# Patient Record
Sex: Male | Born: 1940 | Race: White | Hispanic: No | Marital: Married | Smoking: Never smoker
Health system: Southern US, Community
[De-identification: ages and names within clinical notes are randomized; demographics above are authoritative.]

## PROBLEM LIST (undated history)

## (undated) DIAGNOSIS — I4891 Unspecified atrial fibrillation: Secondary | ICD-10-CM

## (undated) DIAGNOSIS — N2889 Other specified disorders of kidney and ureter: Secondary | ICD-10-CM

## (undated) DIAGNOSIS — G40909 Epilepsy, unspecified, not intractable, without status epilepticus: Secondary | ICD-10-CM

## (undated) DIAGNOSIS — S069X9A Unspecified intracranial injury with loss of consciousness of unspecified duration, initial encounter: Secondary | ICD-10-CM

## (undated) DIAGNOSIS — F32A Depression, unspecified: Secondary | ICD-10-CM

## (undated) DIAGNOSIS — S0990XA Unspecified injury of head, initial encounter: Secondary | ICD-10-CM

## (undated) DIAGNOSIS — F329 Major depressive disorder, single episode, unspecified: Secondary | ICD-10-CM

## (undated) DIAGNOSIS — R269 Unspecified abnormalities of gait and mobility: Secondary | ICD-10-CM

## (undated) DIAGNOSIS — S82891A Other fracture of right lower leg, initial encounter for closed fracture: Secondary | ICD-10-CM

## (undated) DIAGNOSIS — F1011 Alcohol abuse, in remission: Secondary | ICD-10-CM

## (undated) DIAGNOSIS — M109 Gout, unspecified: Secondary | ICD-10-CM

## (undated) DIAGNOSIS — I10 Essential (primary) hypertension: Secondary | ICD-10-CM

## (undated) HISTORY — DX: Major depressive disorder, single episode, unspecified: F32.9

## (undated) HISTORY — PX: CRANIOTOMY: SHX93

## (undated) HISTORY — DX: Depression, unspecified: F32.A

## (undated) HISTORY — DX: Other specified disorders of kidney and ureter: N28.89

## (undated) HISTORY — DX: Other fracture of right lower leg, initial encounter for closed fracture: S82.891A

## (undated) HISTORY — DX: Unspecified abnormalities of gait and mobility: R26.9

## (undated) HISTORY — DX: Epilepsy, unspecified, not intractable, without status epilepticus: G40.909

## (undated) HISTORY — DX: Gout, unspecified: M10.9

## (undated) HISTORY — PX: HERNIA REPAIR: SHX51

## (undated) HISTORY — DX: Alcohol abuse, in remission: F10.11

## (undated) HISTORY — DX: Unspecified injury of head, initial encounter: S09.90XA

## (undated) HISTORY — DX: Unspecified intracranial injury with loss of consciousness of unspecified duration, initial encounter: S06.9X9A

---

## 2003-07-26 DIAGNOSIS — S069X9A Unspecified intracranial injury with loss of consciousness of unspecified duration, initial encounter: Secondary | ICD-10-CM

## 2003-07-26 DIAGNOSIS — S069XAA Unspecified intracranial injury with loss of consciousness status unknown, initial encounter: Secondary | ICD-10-CM

## 2003-07-26 HISTORY — DX: Unspecified intracranial injury with loss of consciousness of unspecified duration, initial encounter: S06.9X9A

## 2003-07-26 HISTORY — DX: Unspecified intracranial injury with loss of consciousness status unknown, initial encounter: S06.9XAA

## 2007-07-26 HISTORY — PX: CRANIECTOMY FOR DEPRESSED SKULL FRACTURE: SHX5788

## 2007-09-07 ENCOUNTER — Ambulatory Visit: Payer: Self-pay | Admitting: Oral Surgery

## 2008-05-04 ENCOUNTER — Emergency Department: Payer: Self-pay | Admitting: Emergency Medicine

## 2008-05-05 ENCOUNTER — Emergency Department: Payer: Self-pay | Admitting: Emergency Medicine

## 2008-07-04 ENCOUNTER — Ambulatory Visit: Payer: Self-pay | Admitting: Neurology

## 2008-11-19 ENCOUNTER — Encounter: Payer: Self-pay | Admitting: Neurology

## 2008-11-22 ENCOUNTER — Encounter: Payer: Self-pay | Admitting: Neurology

## 2008-12-23 ENCOUNTER — Encounter: Payer: Self-pay | Admitting: Neurology

## 2009-02-22 ENCOUNTER — Encounter: Payer: Self-pay | Admitting: Neurology

## 2009-03-25 ENCOUNTER — Encounter: Payer: Self-pay | Admitting: Neurology

## 2010-07-25 HISTORY — PX: ORIF ANKLE FRACTURE: SHX5408

## 2011-03-27 ENCOUNTER — Emergency Department (HOSPITAL_COMMUNITY): Payer: Medicare HMO

## 2011-03-27 ENCOUNTER — Inpatient Hospital Stay (HOSPITAL_COMMUNITY)
Admission: EM | Admit: 2011-03-27 | Discharge: 2011-04-22 | DRG: 492 | Disposition: A | Discharge status: Skilled Nursing Facility | Payer: Medicare HMO | Attending: Internal Medicine | Admitting: Internal Medicine

## 2011-03-27 DIAGNOSIS — F3289 Other specified depressive episodes: Secondary | ICD-10-CM | POA: Diagnosis present

## 2011-03-27 DIAGNOSIS — E46 Unspecified protein-calorie malnutrition: Secondary | ICD-10-CM | POA: Diagnosis not present

## 2011-03-27 DIAGNOSIS — F1020 Alcohol dependence, uncomplicated: Secondary | ICD-10-CM | POA: Diagnosis present

## 2011-03-27 DIAGNOSIS — J690 Pneumonitis due to inhalation of food and vomit: Secondary | ICD-10-CM | POA: Diagnosis not present

## 2011-03-27 DIAGNOSIS — S82409A Unspecified fracture of shaft of unspecified fibula, initial encounter for closed fracture: Principal | ICD-10-CM | POA: Diagnosis present

## 2011-03-27 DIAGNOSIS — F329 Major depressive disorder, single episode, unspecified: Secondary | ICD-10-CM | POA: Diagnosis present

## 2011-03-27 DIAGNOSIS — Y92009 Unspecified place in unspecified non-institutional (private) residence as the place of occurrence of the external cause: Secondary | ICD-10-CM

## 2011-03-27 DIAGNOSIS — F1027 Alcohol dependence with alcohol-induced persisting dementia: Secondary | ICD-10-CM | POA: Diagnosis not present

## 2011-03-27 DIAGNOSIS — I6350 Cerebral infarction due to unspecified occlusion or stenosis of unspecified cerebral artery: Secondary | ICD-10-CM | POA: Diagnosis not present

## 2011-03-27 DIAGNOSIS — G8190 Hemiplegia, unspecified affecting unspecified side: Secondary | ICD-10-CM | POA: Diagnosis present

## 2011-03-27 DIAGNOSIS — E7800 Pure hypercholesterolemia, unspecified: Secondary | ICD-10-CM | POA: Diagnosis present

## 2011-03-27 DIAGNOSIS — F10939 Alcohol use, unspecified with withdrawal, unspecified: Secondary | ICD-10-CM | POA: Diagnosis not present

## 2011-03-27 DIAGNOSIS — W010XXA Fall on same level from slipping, tripping and stumbling without subsequent striking against object, initial encounter: Secondary | ICD-10-CM | POA: Diagnosis present

## 2011-03-27 DIAGNOSIS — F10239 Alcohol dependence with withdrawal, unspecified: Secondary | ICD-10-CM | POA: Diagnosis not present

## 2011-03-27 DIAGNOSIS — G929 Unspecified toxic encephalopathy: Secondary | ICD-10-CM | POA: Diagnosis present

## 2011-03-27 DIAGNOSIS — I10 Essential (primary) hypertension: Secondary | ICD-10-CM | POA: Diagnosis present

## 2011-03-27 DIAGNOSIS — E669 Obesity, unspecified: Secondary | ICD-10-CM | POA: Diagnosis present

## 2011-03-27 DIAGNOSIS — G40909 Epilepsy, unspecified, not intractable, without status epilepticus: Secondary | ICD-10-CM | POA: Diagnosis present

## 2011-03-27 DIAGNOSIS — G92 Toxic encephalopathy: Secondary | ICD-10-CM | POA: Diagnosis present

## 2011-03-27 DIAGNOSIS — D72829 Elevated white blood cell count, unspecified: Secondary | ICD-10-CM | POA: Diagnosis not present

## 2011-03-27 DIAGNOSIS — N390 Urinary tract infection, site not specified: Secondary | ICD-10-CM | POA: Diagnosis not present

## 2011-03-27 DIAGNOSIS — I4891 Unspecified atrial fibrillation: Secondary | ICD-10-CM | POA: Diagnosis present

## 2011-03-27 DIAGNOSIS — M109 Gout, unspecified: Secondary | ICD-10-CM | POA: Diagnosis present

## 2011-03-27 HISTORY — DX: Essential (primary) hypertension: I10

## 2011-03-27 LAB — DIFFERENTIAL
Basophils Absolute: 0 10*3/uL (ref 0.0–0.1)
Basophils Relative: 0 % (ref 0–1)
Monocytes Absolute: 0.8 10*3/uL (ref 0.1–1.0)
Neutro Abs: 5.5 10*3/uL (ref 1.7–7.7)
Neutrophils Relative %: 57 % (ref 43–77)

## 2011-03-27 LAB — BASIC METABOLIC PANEL
CO2: 24 mEq/L (ref 19–32)
Calcium: 9 mg/dL (ref 8.4–10.5)
Sodium: 138 mEq/L (ref 135–145)

## 2011-03-27 LAB — PROTIME-INR
INR: 0.95 (ref 0.00–1.49)
Prothrombin Time: 12.9 seconds (ref 11.6–15.2)

## 2011-03-27 LAB — CBC
Hemoglobin: 17.1 g/dL — ABNORMAL HIGH (ref 13.0–17.0)
MCHC: 35.3 g/dL (ref 30.0–36.0)
RBC: 5.41 MIL/uL (ref 4.22–5.81)

## 2011-03-27 LAB — APTT: aPTT: 27 seconds (ref 24–37)

## 2011-03-28 DIAGNOSIS — S8263XA Displaced fracture of lateral malleolus of unspecified fibula, initial encounter for closed fracture: Secondary | ICD-10-CM

## 2011-03-28 LAB — BASIC METABOLIC PANEL
GFR calc Af Amer: >60 mL/min (ref >60)
GFR calc non Af Amer: >60 mL/min (ref >60)
Potassium: 5.3 mEq/L — ABNORMAL HIGH (ref 3.5–5.1)
Sodium: 140 mEq/L (ref 135–145)

## 2011-03-28 LAB — CBC
Hemoglobin: 16.3 g/dL (ref 13.0–17.0)
MCHC: 34.8 g/dL (ref 30.0–36.0)
Platelets: 85 10*3/uL — ABNORMAL LOW (ref 150–400)
RDW: 15.6 % — ABNORMAL HIGH (ref 11.5–15.5)

## 2011-03-29 LAB — BASIC METABOLIC PANEL WITH GFR
BUN: 10 mg/dL (ref 6–23)
CO2: 30 meq/L (ref 19–32)
Calcium: 8.9 mg/dL (ref 8.4–10.5)
Chloride: 99 meq/L (ref 96–112)
Creatinine, Ser: 0.71 mg/dL (ref 0.50–1.35)
GFR calc Af Amer: >60 mL/min
GFR calc non Af Amer: >60 mL/min
Glucose, Bld: 114 mg/dL — ABNORMAL HIGH (ref 70–99)
Potassium: 3.6 meq/L (ref 3.5–5.1)
Sodium: 137 meq/L (ref 135–145)

## 2011-03-29 LAB — GLUCOSE, CAPILLARY: Glucose-Capillary: 133 mg/dL — ABNORMAL HIGH (ref 70–99)

## 2011-03-29 NOTE — Op Note (Signed)
NAMEMarland [name]  DEQUARIUS, JEFFRIES NO.:  [id number]  MEDICAL RECORD NO.:  [id number]  LOCATION:  5006                         FACILITY:  [facility]  PHYSICIAN:  Madlyn Frankel. Charlann Boxer, M.D.  DATE OF BIRTH:  1940-09-14  DATE OF PROCEDURE:  03/27/2011 DATE OF DISCHARGE:                              OPERATIVE REPORT   PREOPERATIVE DIAGNOSES:  Left ankle fracture dislocation with a pronation external rotation pattern with a Weber C comminuted fibula fracture in the distal third of the shaft associated with a syndesmotic disruption.  POSTOPERATIVE DIAGNOSES:  Left ankle fracture dislocation with a pronation external rotation pattern with a Weber C comminuted fibula fracture in the distal third of the shaft associated with a syndesmotic disruption.  PROCEDURE: 1. Open reduction internal fixation of left ankle fracture     dislocation. 2. Open reduction internal rotation of left syndesmotic ligament     rupture.  SURGEON:  Madlyn Frankel. Charlann Boxer, MD  ASSISTANT:  Surgical team.  ANESTHESIA:  General.  SPECIMEN:  None.  COMPLICATIONS:  None.  DRAINS:  None.  TOURNIQUET TIME:  600 minutes, 250 mmHg.  COMPONENTS USED:  I used a 10 hole [employer] one-third tubular plate, 2 inner fragmentary screws, 3 proximal screws into the plate, 1 distal 4-0 cancellous screw into the plate, and 2 transverse trans tibia-fibula screws.  INDICATIONS:  [name] is a 70 year old gentleman who had fell on his home.  He had obvious deformity and inability to bear weight, was brought to the emergency room where radiographs revealed fracture dislocation of his left ankle.  After obtaining consent for the indications of open reduction internal fixation, stabilizing the fracture, consent was obtained for benefit of pain fracture management.  PROCEDURE IN DETAIL:  The patient was brought to operative theater. Once adequate anesthesia, preoperative antibiotics, Ancef administered, the patient was positioned  supine with a bump underneath the left hip. The left thigh tourniquet was placed.  The left lower extremity was then pre scrubbed, prepped, and draped in sterile fashion.  Time-out was performed identifying the patient, planned procedure, and extremity.  Leg was exsanguinated, tourniquet elevated to 250 mmHg.  Under fluoroscopic imaging, the fracture site was identified to identify the location of the incision.  A lateral incision was then made.  Sharp dissection was carried down to the fibula.  The comminuted and segmental fibular fracture was identified.  Please note that the ankle was unable to be reduced in the emergency room and was reduced once he was asleep in the operating room.  At this point, the fracture fragments were nearly anatomically positioned. Using clamps the bone holding forceps, the fragments were held together in anatomic position.  One interfragment screw was placed in the distal segment and a second one more proximally.  I felt this was adequate immobilization of these fragments and then will be neutralized by a long plate.He was noted radiographically at this point to have syndesmotic disruption with widening of his tibia fibular space.  Using a [employer] clamp, the ankle mortise was held in an anatomically reduced position.  A 10 hole plate was chosen to span the fracture segment.  A single cam screw was placed proximal and  a cancellous screw placed in the distal portion of plate.  This was assessed fluoroscopically and found at this point to be anatomic.  Based on location of plate, I was able to get two 4.0 cancellous screws across the syndesmosis from the fibula into the tibia.  The location was confirmed radiographically as well as in orientation to make certain they are parallel with the joint.  These 2 screws were passed.  The [facility] clamp was removed and the ankle remained stable.  I placed 2 more cortical screws in the proximal segment plate to  hold it in place.  At this point, the fracture was reduced anatomically and the ankle joint was symmetrically reduced at this point without evidence of any subluxation.  Given these findings, I irrigated the wounds with normal saline solution.  I reapproximated subcu layer on the medial stab wound for the [facility] clamp and then placed 3-0 nylon in the skin over there.  On the lateral incision, I did reapproximate the muscle fascia using a 0 Vicryl and then 2-0 Vicryl in subcu layer, and 3-0 nylon in the skin.  All wounds were cleaned, dried, and dressed sterilely using Adaptic and a bulky wrap.  He was placed into a posterior L and U splint for a sugar-tong splint.  He was then extubated and brought to recovery room, tourniquet was let down at 69 minutes.  There was excellent return of blood flow to his foot.     Madlyn Frankel Charlann Boxer, M.D.     MDO/MEDQ  D:  03/27/2011  T:  03/27/2011  Job:  [id number]  Electronically Signed by Durene Romans M.D. on 03/29/2011 09:05:53 AM

## 2011-03-29 NOTE — H&P (Signed)
NAME:  James, [name] NO.:  [id number]  MEDICAL RECORD NO.:  [id number]  LOCATION:  [facility]                         FACILITY:  [facility]  PHYSICIAN:  Madlyn Frankel. Charlann Boxer, M.D.  DATE OF BIRTH:  12/12/1940  DATE OF ADMISSION:  03/27/2011 DATE OF DISCHARGE:                             HISTORY & PHYSICAL   ADMISSION DIAGNOSIS:  Left ankle fracture dislocation.  HISTORY:  James [name] is a 70 year old gentleman who resides in [place] with his wife.  He unfortunately has stumble in his house this evening, first around at 11:30 where he lost his balance.  He has had a history of stroke with residual minor right-sided weakness combined with the kind of alcohol they are drinking.  Nonetheless, he had a stumbled fall, had immediate onset pain and deformity, was brought to the emergency room based on his inability to bear weight.  Attempts in the emergency room to reduce his ankle were unsuccessful. Orthopedist was consulted.  The radiographs revealed fracture dislocation of the ankle.  No other injuries reported.  PAST MEDICAL HISTORY: 1. History of gout. 2. Hypertension. 3. Atrial fibrillation. 4. Depression. 5. Hypercholesterolemia. 6. 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 Donette Larry for infection.  FAMILY HISTORY:  Negative for diabetes.  SOCIAL HISTORY:  He does drink a pint of liquor a day.  Denies tobacco use.  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  His primary care physician is Dr. Daniel Nones  DRUG ALLERGIES: 1. CODEINE. 2. SULFA. 3. TETANUS.  CURRENT MEDICATIONS: 1. Zyrtec as needed for allergies. 2. Colchicine 0.6 mg p.o.  daily as needed. 3. Aspirin 81 mg daily. 4. Magnesium oxide 400 mg b.i.d. 5. Toprol-XL 50 mg daily. 6. Pepcid 20 mg daily. 7. Digoxin 0.125 mg daily. 8. Zoloft 100 mg daily. 9. Zocor 40 mg daily. 10.Keppra 500 mg 3 tablets b.i.d.  REVIEW OF SYSTEMS:  Other than that reported including right-sided residual weakness neurologically and his gait disturbance associated with this left lower extremity injury tonight.  He has had no recent hospitalizations for any concern from infection around his craniotomies. No fevers, chills, night sweats, wheezing, or shortness of breath.  No chest pains, abdominal pains, nausea, vomiting, constipation, or diarrhea.  PHYSICAL EXAMINATION:  The patient is seen and evaluated in the emergency room.  He has obvious deformities of the left lower extremity and current splint.  He is sleepy, reporting pain otherwise.  He is not wheezing and denies any shortness of breath.  He is afebrile with stable vital signs.  His chest is clear to auscultation.  Heart has regular rate and he has an obese abdomen, soft and nontender.  He has no gross deformities in upper extremities.  His right lower extremity is normal.  Strength exam is not assessed adequately at this time due to his pain and medications sleepiness.  His left lower extremity has obvious deformity at the ankle.  He has cool to touch both of bilateral feet.  No asymmetry noted.  No evidence of any color discoloration.  He has had palpable pulses in the left lower extremity.  Skin is otherwise intact.  RADIOGRAPHS:  Views of his left ankle revealed fracture dislocation with a pronation external rotation pattern with a Weber C distal fibula fracture, comminuted.  LABORATORY DATA:  She had a white count of 9.7, hematocrit of 48.5.  His electrolytes revealed sodium 138, potassium 3.7, BUN 16, creatinine 0.8, and his glucose is 106.  ASSESSMENT:  Closed left ankle fracture dislocation.  PLAN:   After reviewing with [name] his current situation, plan is to take him to the operating room tonight for closed reduction, then possible open reduction and internal fixation, possible need for syndesmotic fixation versus external fixation application.  Risks, benefits, and necessity were all discussed and reviewed.  Consent will be obtained based on discussion.     Madlyn Frankel Charlann Boxer, M.D.     MDO/MEDQ  D:  03/27/2011  T:  03/27/2011  Job:  [id number]  Electronically Signed by Durene Romans M.D. on 03/29/2011 09:05:50 AM

## 2011-03-30 DIAGNOSIS — R4182 Altered mental status, unspecified: Secondary | ICD-10-CM

## 2011-03-30 DIAGNOSIS — F10239 Alcohol dependence with withdrawal, unspecified: Secondary | ICD-10-CM

## 2011-03-30 DIAGNOSIS — F1020 Alcohol dependence, uncomplicated: Secondary | ICD-10-CM

## 2011-03-30 DIAGNOSIS — R569 Unspecified convulsions: Secondary | ICD-10-CM

## 2011-03-30 LAB — GLUCOSE, CAPILLARY
Glucose-Capillary: 129 mg/dL — ABNORMAL HIGH (ref 70–99)
Glucose-Capillary: 115 mg/dL — ABNORMAL HIGH (ref 70–99)

## 2011-03-30 LAB — BASIC METABOLIC PANEL
BUN: 13 mg/dL (ref 6–23)
Chloride: 102 mEq/L (ref 96–112)
Creatinine, Ser: 0.68 mg/dL (ref 0.50–1.35)
GFR calc Af Amer: >60 mL/min (ref >60)
GFR calc non Af Amer: >60 mL/min (ref >60)
Glucose, Bld: 127 mg/dL — ABNORMAL HIGH (ref 70–99)

## 2011-03-30 LAB — CBC
HCT: 40.8 % (ref 39.0–52.0)
MCHC: 35 g/dL (ref 30.0–36.0)
MCV: 90.7 fL (ref 78.0–100.0)
RDW: 15.3 % (ref 11.5–15.5)

## 2011-03-31 DIAGNOSIS — F10231 Alcohol dependence with withdrawal delirium: Secondary | ICD-10-CM

## 2011-03-31 DIAGNOSIS — R4182 Altered mental status, unspecified: Secondary | ICD-10-CM

## 2011-03-31 LAB — GLUCOSE, CAPILLARY
Glucose-Capillary: 106 mg/dL — ABNORMAL HIGH (ref 70–99)
Glucose-Capillary: 99 mg/dL (ref 70–99)
Glucose-Capillary: 105 mg/dL — ABNORMAL HIGH (ref 70–99)
Glucose-Capillary: 92 mg/dL (ref 70–99)

## 2011-03-31 LAB — CBC
MCHC: 33.4 g/dL (ref 30.0–36.0)
Platelets: 136 10*3/uL — ABNORMAL LOW (ref 150–400)
RDW: 15.2 % (ref 11.5–15.5)
WBC: 8.9 10*3/uL (ref 4.0–10.5)

## 2011-03-31 LAB — BASIC METABOLIC PANEL
GFR calc Af Amer: >60 mL/min (ref >60)
GFR calc non Af Amer: >60 mL/min (ref >60)
Potassium: 3.6 mEq/L (ref 3.5–5.1)
Sodium: 140 mEq/L (ref 135–145)

## 2011-04-01 ENCOUNTER — Inpatient Hospital Stay (HOSPITAL_COMMUNITY): Payer: Medicare HMO

## 2011-04-02 ENCOUNTER — Inpatient Hospital Stay (HOSPITAL_COMMUNITY): Payer: Medicare HMO

## 2011-04-02 LAB — CBC
MCH: 31.4 pg (ref 26.0–34.0)
MCHC: 34.1 g/dL (ref 30.0–36.0)
MCV: 92.1 fL (ref 78.0–100.0)
Platelets: 153 10*3/uL (ref 150–400)
RBC: 4.17 MIL/uL — ABNORMAL LOW (ref 4.22–5.81)
RDW: 14.3 % (ref 11.5–15.5)

## 2011-04-02 LAB — URINE CULTURE: Colony Count: NO GROWTH

## 2011-04-02 LAB — GLUCOSE, CAPILLARY: Glucose-Capillary: 106 mg/dL — ABNORMAL HIGH (ref 70–99)

## 2011-04-02 LAB — BASIC METABOLIC PANEL
CO2: 27 mEq/L (ref 19–32)
Calcium: 8.6 mg/dL (ref 8.4–10.5)
Creatinine, Ser: 0.79 mg/dL (ref 0.50–1.35)

## 2011-04-02 LAB — DIGOXIN LEVEL: Digoxin Level: <0.3 ng/mL — ABNORMAL LOW (ref 0.8–2.0)

## 2011-04-03 ENCOUNTER — Inpatient Hospital Stay (HOSPITAL_COMMUNITY): Payer: Medicare HMO

## 2011-04-03 LAB — GLUCOSE, CAPILLARY
Glucose-Capillary: 113 mg/dL — ABNORMAL HIGH (ref 70–99)
Glucose-Capillary: 119 mg/dL — ABNORMAL HIGH (ref 70–99)
Glucose-Capillary: 122 mg/dL — ABNORMAL HIGH (ref 70–99)

## 2011-04-03 LAB — CBC
HCT: 37.2 % — ABNORMAL LOW (ref 39.0–52.0)
Hemoglobin: 12.8 g/dL — ABNORMAL LOW (ref 13.0–17.0)
MCH: 31.4 pg (ref 26.0–34.0)
MCV: 91.2 fL (ref 78.0–100.0)
RBC: 4.08 MIL/uL — ABNORMAL LOW (ref 4.22–5.81)

## 2011-04-03 LAB — BASIC METABOLIC PANEL
BUN: 14 mg/dL (ref 6–23)
Creatinine, Ser: 0.56 mg/dL (ref 0.50–1.35)
GFR calc Af Amer: >60 mL/min (ref >60)
GFR calc non Af Amer: >60 mL/min (ref >60)
Potassium: 3.6 mEq/L (ref 3.5–5.1)

## 2011-04-04 ENCOUNTER — Inpatient Hospital Stay (HOSPITAL_COMMUNITY): Payer: Medicare HMO

## 2011-04-04 DIAGNOSIS — R569 Unspecified convulsions: Secondary | ICD-10-CM

## 2011-04-04 DIAGNOSIS — F1020 Alcohol dependence, uncomplicated: Secondary | ICD-10-CM

## 2011-04-04 DIAGNOSIS — R4182 Altered mental status, unspecified: Secondary | ICD-10-CM

## 2011-04-04 DIAGNOSIS — F10239 Alcohol dependence with withdrawal, unspecified: Secondary | ICD-10-CM

## 2011-04-04 LAB — BASIC METABOLIC PANEL
BUN: 15 mg/dL (ref 6–23)
GFR calc Af Amer: >60 mL/min (ref >60)
GFR calc non Af Amer: >60 mL/min (ref >60)
Potassium: 3 mEq/L — ABNORMAL LOW (ref 3.5–5.1)
Sodium: 139 mEq/L (ref 135–145)

## 2011-04-04 LAB — CBC
HCT: 37 % — ABNORMAL LOW (ref 39.0–52.0)
MCHC: 34.9 g/dL (ref 30.0–36.0)
Platelets: 169 10*3/uL (ref 150–400)
RDW: 13.9 % (ref 11.5–15.5)
WBC: 8.1 10*3/uL (ref 4.0–10.5)

## 2011-04-04 LAB — MAGNESIUM: Magnesium: 2 mg/dL (ref 1.5–2.5)

## 2011-04-04 LAB — GLUCOSE, CAPILLARY: Glucose-Capillary: 111 mg/dL — ABNORMAL HIGH (ref 70–99)

## 2011-04-05 DIAGNOSIS — F10231 Alcohol dependence with withdrawal delirium: Secondary | ICD-10-CM

## 2011-04-05 DIAGNOSIS — R4182 Altered mental status, unspecified: Secondary | ICD-10-CM

## 2011-04-05 LAB — GLUCOSE, CAPILLARY
Glucose-Capillary: 123 mg/dL — ABNORMAL HIGH (ref 70–99)
Glucose-Capillary: 123 mg/dL — ABNORMAL HIGH (ref 70–99)

## 2011-04-05 LAB — BASIC METABOLIC PANEL
BUN: 15 mg/dL (ref 6–23)
Calcium: 8.7 mg/dL (ref 8.4–10.5)
Chloride: 105 mEq/L (ref 96–112)
Creatinine, Ser: 0.71 mg/dL (ref 0.50–1.35)
GFR calc Af Amer: >60 mL/min (ref >60)

## 2011-04-06 DIAGNOSIS — F05 Delirium due to known physiological condition: Secondary | ICD-10-CM

## 2011-04-06 DIAGNOSIS — F10951 Alcohol use, unspecified with alcohol-induced psychotic disorder with hallucinations: Secondary | ICD-10-CM

## 2011-04-06 DIAGNOSIS — I4891 Unspecified atrial fibrillation: Secondary | ICD-10-CM

## 2011-04-06 LAB — GLUCOSE, CAPILLARY
Glucose-Capillary: 135 mg/dL — ABNORMAL HIGH (ref 70–99)
Glucose-Capillary: 132 mg/dL — ABNORMAL HIGH (ref 70–99)
Glucose-Capillary: 141 mg/dL — ABNORMAL HIGH (ref 70–99)

## 2011-04-06 LAB — BASIC METABOLIC PANEL
BUN: 18 mg/dL (ref 6–23)
Calcium: 8.7 mg/dL (ref 8.4–10.5)
GFR calc non Af Amer: >60 mL/min (ref >60)
Glucose, Bld: 148 mg/dL — ABNORMAL HIGH (ref 70–99)

## 2011-04-07 ENCOUNTER — Inpatient Hospital Stay (HOSPITAL_COMMUNITY): Payer: Medicare HMO

## 2011-04-07 LAB — CARDIAC PANEL(CRET KIN+CKTOT+MB+TROPI)
CK, MB: 2 ng/mL (ref 0.3–4.0)
CK, MB: 1.4 ng/mL (ref 0.3–4.0)
Relative Index: INVALID (ref 0.0–2.5)
Relative Index: INVALID (ref 0.0–2.5)
Total CK: 55 U/L (ref 7–232)
Total CK: 41 U/L (ref 7–232)
Troponin I: <0.3 ng/mL

## 2011-04-07 LAB — URINALYSIS, ROUTINE W REFLEX MICROSCOPIC
Bilirubin Urine: NEGATIVE
Glucose, UA: NEGATIVE mg/dL
Hgb urine dipstick: NEGATIVE
Ketones, ur: NEGATIVE mg/dL
Nitrite: NEGATIVE
Protein, ur: NEGATIVE mg/dL
Specific Gravity, Urine: 1.022 (ref 1.005–1.030)
Urobilinogen, UA: 4 mg/dL — ABNORMAL HIGH (ref 0.0–1.0)
pH: 8.5 — ABNORMAL HIGH (ref 5.0–8.0)

## 2011-04-07 LAB — URINE MICROSCOPIC-ADD ON

## 2011-04-07 LAB — GLUCOSE, CAPILLARY
Glucose-Capillary: 126 mg/dL — ABNORMAL HIGH (ref 70–99)
Glucose-Capillary: 118 mg/dL — ABNORMAL HIGH (ref 70–99)
Glucose-Capillary: 133 mg/dL — ABNORMAL HIGH (ref 70–99)
Glucose-Capillary: 134 mg/dL — ABNORMAL HIGH (ref 70–99)

## 2011-04-07 LAB — CBC
MCH: 30.4 pg (ref 26.0–34.0)
MCHC: 32.4 g/dL (ref 30.0–36.0)
Platelets: 252 10*3/uL (ref 150–400)
RDW: 14.6 % (ref 11.5–15.5)

## 2011-04-07 LAB — BASIC METABOLIC PANEL WITH GFR
BUN: 17 mg/dL (ref 6–23)
CO2: 27 meq/L (ref 19–32)
Calcium: 8.8 mg/dL (ref 8.4–10.5)
Chloride: 107 meq/L (ref 96–112)
Creatinine, Ser: 0.58 mg/dL (ref 0.50–1.35)
GFR calc Af Amer: >60 mL/min
GFR calc non Af Amer: >60 mL/min
Glucose, Bld: 121 mg/dL — ABNORMAL HIGH (ref 70–99)
Potassium: 3.6 meq/L (ref 3.5–5.1)
Sodium: 143 meq/L (ref 135–145)

## 2011-04-08 LAB — DIFFERENTIAL
Lymphs Abs: 1.2 10*3/uL (ref 0.7–4.0)
Monocytes Relative: 10 % (ref 3–12)
Neutro Abs: 9.4 10*3/uL — ABNORMAL HIGH (ref 1.7–7.7)
Neutrophils Relative %: 79 % — ABNORMAL HIGH (ref 43–77)

## 2011-04-08 LAB — GLUCOSE, CAPILLARY
Glucose-Capillary: 141 mg/dL — ABNORMAL HIGH (ref 70–99)
Glucose-Capillary: 132 mg/dL — ABNORMAL HIGH (ref 70–99)

## 2011-04-08 LAB — IRON AND TIBC: TIBC: 182 ug/dL — ABNORMAL LOW (ref 215–435)

## 2011-04-08 LAB — COMPREHENSIVE METABOLIC PANEL
ALT: 65 U/L — ABNORMAL HIGH (ref 0–53)
BUN: 17 mg/dL (ref 6–23)
CO2: 30 mEq/L (ref 19–32)
Calcium: 8.6 mg/dL (ref 8.4–10.5)
Creatinine, Ser: 0.6 mg/dL (ref 0.50–1.35)
GFR calc Af Amer: >60 mL/min (ref >60)
GFR calc non Af Amer: >60 mL/min (ref >60)
Glucose, Bld: 125 mg/dL — ABNORMAL HIGH (ref 70–99)

## 2011-04-08 LAB — CULTURE, BLOOD (ROUTINE X 2)
Culture  Setup Time: 201209080224
Culture  Setup Time: 201209080224
Culture: NO GROWTH

## 2011-04-08 LAB — CBC
HCT: 38.1 % — ABNORMAL LOW (ref 39.0–52.0)
Hemoglobin: 12.8 g/dL — ABNORMAL LOW (ref 13.0–17.0)
MCH: 31.4 pg (ref 26.0–34.0)
MCV: 93.6 fL (ref 78.0–100.0)
RBC: 4.07 MIL/uL — ABNORMAL LOW (ref 4.22–5.81)

## 2011-04-08 LAB — CARDIAC PANEL(CRET KIN+CKTOT+MB+TROPI)
CK, MB: 1.8 ng/mL (ref 0.3–4.0)
Troponin I: <0.3 ng/mL (ref <0.30)

## 2011-04-08 LAB — FOLATE: Folate: 12.7 ng/mL

## 2011-04-09 ENCOUNTER — Inpatient Hospital Stay (HOSPITAL_COMMUNITY): Payer: Medicare HMO

## 2011-04-09 LAB — GLUCOSE, CAPILLARY
Glucose-Capillary: 124 mg/dL — ABNORMAL HIGH (ref 70–99)
Glucose-Capillary: 130 mg/dL — ABNORMAL HIGH (ref 70–99)

## 2011-04-09 LAB — URINE CULTURE: Colony Count: >=100000

## 2011-04-10 ENCOUNTER — Inpatient Hospital Stay (HOSPITAL_COMMUNITY): Payer: Medicare HMO

## 2011-04-10 LAB — CBC
HCT: 37.2 % — ABNORMAL LOW (ref 39.0–52.0)
Hemoglobin: 12.2 g/dL — ABNORMAL LOW (ref 13.0–17.0)
MCHC: 32.8 g/dL (ref 30.0–36.0)
RBC: 3.97 MIL/uL — ABNORMAL LOW (ref 4.22–5.81)
WBC: 12.5 10*3/uL — ABNORMAL HIGH (ref 4.0–10.5)

## 2011-04-10 LAB — BASIC METABOLIC PANEL
Calcium: 8.5 mg/dL (ref 8.4–10.5)
GFR calc Af Amer: >60 mL/min (ref >60)
GFR calc non Af Amer: >60 mL/min (ref >60)
Glucose, Bld: 174 mg/dL — ABNORMAL HIGH (ref 70–99)
Potassium: 3.5 mEq/L (ref 3.5–5.1)
Sodium: 138 mEq/L (ref 135–145)

## 2011-04-10 LAB — GLUCOSE, CAPILLARY
Glucose-Capillary: 138 mg/dL — ABNORMAL HIGH (ref 70–99)
Glucose-Capillary: 124 mg/dL — ABNORMAL HIGH (ref 70–99)
Glucose-Capillary: 164 mg/dL — ABNORMAL HIGH (ref 70–99)
Glucose-Capillary: 120 mg/dL — ABNORMAL HIGH (ref 70–99)

## 2011-04-11 ENCOUNTER — Inpatient Hospital Stay (HOSPITAL_COMMUNITY): Payer: Medicare HMO

## 2011-04-11 LAB — CBC
Hemoglobin: 12.3 g/dL — ABNORMAL LOW (ref 13.0–17.0)
MCV: 93.8 fL (ref 78.0–100.0)
Platelets: 322 10*3/uL (ref 150–400)
RBC: 4.01 MIL/uL — ABNORMAL LOW (ref 4.22–5.81)
WBC: 11.6 10*3/uL — ABNORMAL HIGH (ref 4.0–10.5)

## 2011-04-11 LAB — GLUCOSE, CAPILLARY
Glucose-Capillary: 126 mg/dL — ABNORMAL HIGH (ref 70–99)
Glucose-Capillary: 143 mg/dL — ABNORMAL HIGH (ref 70–99)
Glucose-Capillary: 154 mg/dL — ABNORMAL HIGH (ref 70–99)
Glucose-Capillary: 135 mg/dL — ABNORMAL HIGH (ref 70–99)

## 2011-04-11 LAB — BASIC METABOLIC PANEL
CO2: 29 mEq/L (ref 19–32)
Chloride: 100 mEq/L (ref 96–112)
GFR calc Af Amer: >60 mL/min (ref >60)
Potassium: 3.6 mEq/L (ref 3.5–5.1)

## 2011-04-11 LAB — PRO B NATRIURETIC PEPTIDE: Pro B Natriuretic peptide (BNP): 5 pg/mL (ref 0–125)

## 2011-04-12 ENCOUNTER — Inpatient Hospital Stay (HOSPITAL_COMMUNITY): Payer: Medicare HMO

## 2011-04-12 LAB — CBC
HCT: 35.2 % — ABNORMAL LOW (ref 39.0–52.0)
MCHC: 33.8 g/dL (ref 30.0–36.0)
Platelets: 317 10*3/uL (ref 150–400)
RDW: 14.2 % (ref 11.5–15.5)
WBC: 10.5 10*3/uL (ref 4.0–10.5)

## 2011-04-12 LAB — GLUCOSE, CAPILLARY
Glucose-Capillary: 128 mg/dL — ABNORMAL HIGH (ref 70–99)
Glucose-Capillary: 131 mg/dL — ABNORMAL HIGH (ref 70–99)
Glucose-Capillary: 156 mg/dL — ABNORMAL HIGH (ref 70–99)

## 2011-04-12 LAB — BASIC METABOLIC PANEL
Calcium: 8.5 mg/dL (ref 8.4–10.5)
GFR calc Af Amer: >60 mL/min (ref >60)
GFR calc non Af Amer: >60 mL/min (ref >60)
Potassium: 3.8 mEq/L (ref 3.5–5.1)
Sodium: 141 mEq/L (ref 135–145)

## 2011-04-12 LAB — PRO B NATRIURETIC PEPTIDE: Pro B Natriuretic peptide (BNP): 894 pg/mL — ABNORMAL HIGH (ref 0–125)

## 2011-04-13 LAB — BASIC METABOLIC PANEL
BUN: 30 mg/dL — ABNORMAL HIGH (ref 6–23)
CO2: 30 mEq/L (ref 19–32)
Calcium: 8.9 mg/dL (ref 8.4–10.5)
Chloride: 100 mEq/L (ref 96–112)
Creatinine, Ser: 0.68 mg/dL (ref 0.50–1.35)
GFR calc Af Amer: >60 mL/min (ref >60)

## 2011-04-13 LAB — GLUCOSE, CAPILLARY
Glucose-Capillary: 127 mg/dL — ABNORMAL HIGH (ref 70–99)
Glucose-Capillary: 104 mg/dL — ABNORMAL HIGH (ref 70–99)

## 2011-04-13 LAB — PRO B NATRIURETIC PEPTIDE: Pro B Natriuretic peptide (BNP): 843.2 pg/mL — ABNORMAL HIGH (ref 0–125)

## 2011-04-14 ENCOUNTER — Inpatient Hospital Stay (HOSPITAL_COMMUNITY): Payer: Medicare HMO

## 2011-04-14 LAB — CULTURE, BLOOD (ROUTINE X 2)
Culture  Setup Time: 201209140048
Culture: NO GROWTH

## 2011-04-15 LAB — URINALYSIS, ROUTINE W REFLEX MICROSCOPIC
Glucose, UA: NEGATIVE mg/dL
Hgb urine dipstick: NEGATIVE
Specific Gravity, Urine: 1.029 (ref 1.005–1.030)
Urobilinogen, UA: 4 mg/dL — ABNORMAL HIGH (ref 0.0–1.0)
pH: 5.5 (ref 5.0–8.0)

## 2011-04-15 LAB — LIPID PANEL
Cholesterol: 161 mg/dL (ref 0–200)
Total CHOL/HDL Ratio: 7.7 RATIO
VLDL: 47 mg/dL — ABNORMAL HIGH (ref 0–40)

## 2011-04-15 LAB — CBC
Hemoglobin: 13.7 g/dL (ref 13.0–17.0)
MCH: 31.1 pg (ref 26.0–34.0)
MCHC: 33.2 g/dL (ref 30.0–36.0)
MCV: 93.7 fL (ref 78.0–100.0)
RBC: 4.41 MIL/uL (ref 4.22–5.81)

## 2011-04-15 LAB — URINE MICROSCOPIC-ADD ON

## 2011-04-15 LAB — COMPREHENSIVE METABOLIC PANEL
ALT: 85 U/L — ABNORMAL HIGH (ref 0–53)
AST: 50 U/L — ABNORMAL HIGH (ref 0–37)
Albumin: 2.1 g/dL — ABNORMAL LOW (ref 3.5–5.2)
Alkaline Phosphatase: 327 U/L — ABNORMAL HIGH (ref 39–117)
Chloride: 105 mEq/L (ref 96–112)
Potassium: 3.7 mEq/L (ref 3.5–5.1)
Sodium: 145 mEq/L (ref 135–145)
Total Bilirubin: 1.3 mg/dL — ABNORMAL HIGH (ref 0.3–1.2)
Total Protein: 7.1 g/dL (ref 6.0–8.3)

## 2011-04-16 ENCOUNTER — Inpatient Hospital Stay (HOSPITAL_COMMUNITY): Payer: Medicare HMO

## 2011-04-16 DIAGNOSIS — R509 Fever, unspecified: Secondary | ICD-10-CM

## 2011-04-16 DIAGNOSIS — I6350 Cerebral infarction due to unspecified occlusion or stenosis of unspecified cerebral artery: Secondary | ICD-10-CM

## 2011-04-16 LAB — COMPREHENSIVE METABOLIC PANEL
AST: 88 U/L — ABNORMAL HIGH (ref 0–37)
Alkaline Phosphatase: 342 U/L — ABNORMAL HIGH (ref 39–117)
CO2: 28 mEq/L (ref 19–32)
Chloride: 108 mEq/L (ref 96–112)
Creatinine, Ser: 0.66 mg/dL (ref 0.50–1.35)
GFR calc non Af Amer: >60 mL/min (ref >60)
Total Bilirubin: 1.1 mg/dL (ref 0.3–1.2)

## 2011-04-16 LAB — CBC
MCH: 30.8 pg (ref 26.0–34.0)
MCHC: 32.3 g/dL (ref 30.0–36.0)
MCV: 95.2 fL (ref 78.0–100.0)
Platelets: 339 10*3/uL (ref 150–400)
RBC: 4.16 MIL/uL — ABNORMAL LOW (ref 4.22–5.81)

## 2011-04-16 LAB — URINE CULTURE: Culture  Setup Time: 201209211440

## 2011-04-17 LAB — HIV ANTIBODY (ROUTINE TESTING W REFLEX): HIV: NONREACTIVE

## 2011-04-17 LAB — COMPREHENSIVE METABOLIC PANEL
ALT: 78 U/L — ABNORMAL HIGH (ref 0–53)
Albumin: 2 g/dL — ABNORMAL LOW (ref 3.5–5.2)
Alkaline Phosphatase: 313 U/L — ABNORMAL HIGH (ref 39–117)
Calcium: 8.7 mg/dL (ref 8.4–10.5)
Potassium: 3.4 mEq/L — ABNORMAL LOW (ref 3.5–5.1)
Sodium: 141 mEq/L (ref 135–145)
Total Protein: 6.6 g/dL (ref 6.0–8.3)

## 2011-04-17 LAB — HEPATITIS PANEL, ACUTE
HCV Ab: NEGATIVE
Hep A IgM: NEGATIVE
Hep B C IgM: NEGATIVE
Hepatitis B Surface Ag: NEGATIVE

## 2011-04-17 LAB — CBC
MCHC: 32.3 g/dL (ref 30.0–36.0)
Platelets: 337 10*3/uL (ref 150–400)
RDW: 13.8 % (ref 11.5–15.5)

## 2011-04-18 ENCOUNTER — Inpatient Hospital Stay (HOSPITAL_COMMUNITY): Payer: Medicare HMO

## 2011-04-18 ENCOUNTER — Encounter (HOSPITAL_COMMUNITY): Payer: Self-pay | Admitting: Radiology

## 2011-04-18 LAB — COMPREHENSIVE METABOLIC PANEL
ALT: 62 U/L — ABNORMAL HIGH (ref 0–53)
Alkaline Phosphatase: 289 U/L — ABNORMAL HIGH (ref 39–117)
BUN: 24 mg/dL — ABNORMAL HIGH (ref 6–23)
CO2: 31 mEq/L (ref 19–32)
GFR calc Af Amer: >60 mL/min (ref >60)
GFR calc non Af Amer: >60 mL/min (ref >60)
Glucose, Bld: 112 mg/dL — ABNORMAL HIGH (ref 70–99)
Potassium: 4 mEq/L (ref 3.5–5.1)
Total Protein: 6.7 g/dL (ref 6.0–8.3)

## 2011-04-18 LAB — CBC
HCT: 38.7 % — ABNORMAL LOW (ref 39.0–52.0)
Hemoglobin: 12.8 g/dL — ABNORMAL LOW (ref 13.0–17.0)
MCHC: 33.1 g/dL (ref 30.0–36.0)
MCV: 94.2 fL (ref 78.0–100.0)
RDW: 13.8 % (ref 11.5–15.5)

## 2011-04-18 MED ORDER — IOHEXOL 350 MG/ML SOLN
100.0000 mL | Freq: Once | INTRAVENOUS | Status: AC | PRN
  Administered 2011-04-18: 100 mL via INTRAVENOUS

## 2011-04-20 ENCOUNTER — Inpatient Hospital Stay (HOSPITAL_COMMUNITY): Payer: Medicare HMO

## 2011-04-20 LAB — CBC
HCT: 40 % (ref 39.0–52.0)
MCHC: 33.3 g/dL (ref 30.0–36.0)
Platelets: 286 10*3/uL (ref 150–400)
RDW: 13.5 % (ref 11.5–15.5)
WBC: 9.5 10*3/uL (ref 4.0–10.5)

## 2011-04-20 LAB — COMPREHENSIVE METABOLIC PANEL
ALT: 48 U/L (ref 0–53)
AST: 30 U/L (ref 0–37)
Albumin: 2.3 g/dL — ABNORMAL LOW (ref 3.5–5.2)
Alkaline Phosphatase: 251 U/L — ABNORMAL HIGH (ref 39–117)
BUN: 19 mg/dL (ref 6–23)
Chloride: 100 mEq/L (ref 96–112)
Potassium: 3.5 mEq/L (ref 3.5–5.1)
Sodium: 140 mEq/L (ref 135–145)
Total Bilirubin: 0.7 mg/dL (ref 0.3–1.2)
Total Protein: 6.9 g/dL (ref 6.0–8.3)

## 2011-04-20 LAB — VANCOMYCIN, TROUGH: Vancomycin Tr: 17.7 ug/mL (ref 10.0–20.0)

## 2011-04-21 LAB — CULTURE, BLOOD (ROUTINE X 2)
Culture  Setup Time: 201209210206
Culture: NO GROWTH

## 2011-04-21 NOTE — Discharge Summary (Signed)
  NAME:  James [name], James [name] NO.:  [id number]  MEDICAL RECORD NO.:  [id number]  LOCATION:                               FACILITY:  [facility]  PHYSICIAN:  Zannie Cove, MD     DATE OF BIRTH:  [birth year]  DATE OF ADMISSION:  03/27/2011 DATE OF DISCHARGE:  04/21/2011                              DISCHARGE SUMMARY   ADDENDUM.  DISCHARGE MEDICATIONS: 1. Tylenol 325-650 mg q.4 h. p.r.n. 2. Aspirin enteric-coated 325 mg p.o. daily. 3. Folic acid 1 mg daily. 4. Lasix 20 mg p.o. b.i.d. 5. Risperidone 1 mg tablet 0.5 mg p.o. b.i.d. 6. Zosyn 3.375 g IV q.8 h. for 7 days. 7. Vancomycin 1 g IV q.12 for 7 days, dose can be adjusted based on     trough or creatinine clearance. 8. Senokot-S 1 tablet p.o. at bedtime p.r.n. 9. Thiamine 100 mg daily. 10.Tramadol 50 mg p.o. q.6 h. p.r.n. 11.Digoxin 0.25 mg 1 tablet daily. 12.Keppra 750 mg tablets, 2 tablets p.o. b.i.d. 13.Toprol-XL 75 mg p.o. daily. 14.Colchicine 0.6 mg p.o. daily p.r.n. 15.Pepcid 20 mg p.o. daily. 16.Zocor 40 mg daily. 17.Zoloft 100 mg p.o. daily.  Discharge diagnoses, imaging, hospital course as previously dictated by Dr. Cleotis Lema.  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  The patient's vancomycin dose will need to be managed per pharmacy protocol at the facility for a goal trough of 15-20 and creatinine at the time of discharge is 0.8 and vancomycin to be adjusted based on creatinine clearance as well.  Since she is going to be on vancomycin for one more week, she will need repeat serum creatinine done in 4 days' time as well as a vancomycin trough checked once during the next week as well.     Zannie Cove, MD    PJ/MEDQ  D:  04/20/2011  T:  04/20/2011  Job:  [id number]  Electronically Signed by Zannie Cove  on 04/21/2011 11:58:41 AM

## 2011-05-04 NOTE — Discharge Summary (Signed)
NAMEMarland Kitchen  James [name], James NO.:  [id number]  MEDICAL RECORD NO.:  [id number]  LOCATION:  4526                         FACILITY:  [facility]  PHYSICIAN:  Baltazar Najjar, MD     DATE OF BIRTH:  1941-01-01  DATE OF ADMISSION:  03/27/2011 DATE OF DISCHARGE:                              DISCHARGE SUMMARY   FINAL DISCHARGE DIAGNOSES: 1. Left ankle fracture status post open reduction internal fixation. 2. Alcohol withdrawal. 3. Encephalopathy/delirium. 4. Atrial fibrillation. 5. Protein-calorie malnutrition. 6. Aspiration pneumonia. 7. Acute stroke. 8. Funguria. 9. Fever of unclear etiology, thought to be secondary to aspiration     pneumonia. 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 11.Dysphagia. 12.Elevated liver enzymes/transaminitis.  CONSULTATIONS DURING THIS HOSPITALIZATION:  The patient was seen by: 1. Madlyn Frankel Charlann Boxer, MD from Orthopedic Service. 2. [facility]. 3. Lacretia Leigh. Ninetta Lights, MD from Infectious Disease Service.  RADIOLOGY/IMAGING STUDIES: 1. Left ankle x-ray showed complex dislocation of the tibiotalar     joint. 2. Left knee x-ray showed no left knee fracture. 3. Chest x-ray on March 27, 2011, showed shallow inspiration,     prominent heart size and pulmonary vascularity, may be due to     shallow inspiration or fluid overload.  No focal airspace     consolidation.  Repeat chest x-ray on April 01, 2011, showed     cardiac enlargement and vascular congestion without overt pulmonary     edema. 4. Portable abdominal x-ray on April 02, 2011, showed soft feeding     tube in the region of the antrum or polyps or pylorus. 5. Chest x-ray on April 03, 2011, increased interstitial edema,     CHF. 6. Chest x-ray April 09, 2011, showed stable cardiomegaly,     vascular congestion, and basilar atelectasis.  No acute findings. 7. Portable abdomen x-ray on April 09, 2011, showed feeding tube     in mid stomach. 8. April 11, 2011, ankle  x-ray showed interval ORIF of the ankle     fracture/dislocation with restoration of the anatomic alignment,     questionable residual displaced medial malleolar fracture. 9. MRI of the brain on April 12, 2011, showed small 3-mm acute     posterior limb internal capsule infarct on the left, much larger     chronic insult to the right hemisphere associated with advanced     brain substance loss, superficial siderosis, left but much greater     right compensatory ventricular enlargement. 10.Chest x-ray on April 14, 2011, showed cardiomegaly with mild     pulmonary vascular congestion and mild bibasilar atelectasis. 11.Abdominal sonogram on April 16, 2011, showed unremarkable     gallbladder and liver.  Splenomegaly.  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  Further evaluation with barium postcontrast MRI may be     confirmatory. 12.CT angiogram of the chest on April 18, 2011, showed no     pulmonary embolism, moderate cardiomegaly without edema, small     hiatal hernia.  Mild gaseous distention of portion of the thoracic     esophagus can be seen in the setting of gastroesophageal reflux,  esophageal dysmotility or distal esophageal strictures.     Atelectasis in the dependent portion of both lower lobes.  BRIEF ADMITTING HISTORY:  Please refer to H and P for more details.  On summary, [name] is a 70 year old man who was admitted to the hospital after he sustained a fall and presented with left ankle pain and found to have left ankle fracture.  Please refer to H and P for more details.  HOSPITAL COURSE: 1. Left ankle fracture.  The patient underwent ORIF on March 27, 2011, by Dr. Durene Romans. 2. EtOH withdrawal.  The patient noted to have symptoms suggestive of     delirium tremens and he was subsequently transferred to the ICU for     close monitoring.  He was placed on CIWA  protocol and stabilized. 3. Encephalopathy, probably multifactorial, thought to be secondary to     EtOH withdrawal.  However, it was probably multifactorial given his     previous history of stroke and aspiration pneumonia. 4. Suspected aspiration pneumonia.  The patient was treated with     Unasyn. 5. Protein-calorie malnutrition/dysphagia.  The patient was initially     placed on tube feeding, however, he was reevaluated by Speech     Pathology and his diet was transitioned to dysphagia diet.  He has     planned to have repeat MPS to reassess the situation and advance     his diet as tolerated. 6. Atrial fibrillation.  His rates fluctuate, currently stable.  He is     on metoprolol for rate control, not on anticoagulation.  He is not     on Coumadin, however, he is currently on aspirin. 7. Acute stroke.  The patient was seen by Neurology Service and     aspirin is recommended. 8. Fever.  The patient recently started to have high-grade fever.     Etiology was unclear.  He was empirically covered with vancomycin     and Zosyn.  Workup including UA showed evidence of funguria,     however, there was no evidence of any bacterial infection.  I     initially started him on Diflucan that was subsequently     discontinued by Dr. Ninetta Lights.  His chest x-ray did really show     evidence of pneumonia.  We suspect that may be the surgical site     for his left ankle ORIF could be the culprit.  However, he was seen     by Dr. Charlann Boxer who inspected the wound and there was no evidence of     infection as per Dr. Ninetta Lights.  He recommended to continue     vancomycin and Zosyn for now for 8 more days for possible     healthcare-associated pneumonia versus aspiration pneumonia.  The     patient is currently afebrile and he is improving on antibiotic. 9. Transaminitis/increased liver function enzymes.  He had a sonogram     of the abdomen, did not show any liver pathology.  The patient is     currently on  Keppra and Risperdal that could probably contribute to     increase in liver function tests.  His liver function enzymes are     currently trending down.  Continue to observe. 10.Physical deconditioning.  The patient is followed by PT/OT and     skilled nursing facility placement is recommended.  PHYSICAL EXAMINATION:  GENERAL:  The patient is seen and examined by  me today.  He is pleasantly confused, not in acute distress. VITAL SIGNS:  Blood pressure 117/93, heart rate of 107, temperature 97, T-max 99.1.  He is alert, however, pleasantly confused. CHEST:  Clear to auscultation bilaterally. CARDIOVASCULAR:  S1, S2.  Irregularly irregular. ABDOMEN:  Obese, soft, nontender. EXTREMITIES:  No pedal edema in the right lower extremity.  Left lower extremity dressing in place.  LABORATORY DATA:  Potassium of 4, BUN 24, creatinine 0.86.  Alkaline phosphatase 289, ALT is 62, AST 35, total bilirubin is 0.8.  ASSESSMENT AND PLAN:  As above.  DISPOSITION:  The patient can be discharged to skilled nursing facility in the next 1-2 days.          ______________________________ Baltazar Najjar, MD     SA/MEDQ  D:  04/19/2011  T:  04/19/2011  Job:  [id number]  Electronically Signed by Hannah Beat MD on 05/04/2011 09:56:10 PM

## 2011-05-05 NOTE — Discharge Summary (Signed)
  NAME:  James [name], James [name] NO.:  [id number]  MEDICAL RECORD NO.:  [id number]  LOCATION:  4526                         FACILITY:  [facility]  PHYSICIAN:  Zannie Cove, MD     DATE OF BIRTH:  Oct 19, 1940  DATE OF ADMISSION:  03/27/2011 DATE OF DISCHARGE:                        DISCHARGE SUMMARY - REFERRING   ADDENDUM:  DISCHARGE DIAGNOSES:  As previously dictated by Dr. Cleotis Lema on 04/19/2011.  HOSPITAL COURSE:  As previously dictated by Dr. Cleotis Lema on 04/19/2011.  CONSULTATIONS:  As previously dictated by Dr. Cleotis Lema on 04/19/2011.  In addition, the patient's discharge medications are as follows: 1. Tylenol 650 mg p.o. q.4 h. p.r.n. 2. Enteric coated aspirin 325 mg daily. 3. Folic acid 1 mg daily. 4. Lasix 20 mg p.o. b.i.d. for 7 days and then could change to once a     day. 5. Risperidone 1 mg tablet dose is 0.5 mg p.o. b.i.d. 6. Zosyn 3.375 g IV q.8 h for 6 days. 7. Vancomycin 1 g IV q.12 h. for 6 days, dose can be adjusted based on     vancomycin trough, creatinine clearance. 8. Senokot-S one tablet p.o. at bedtime p.r.n. 9. Thiamine 100 mg daily. 10.Tramadol 50 mg p.o. q.6 h. p.r.n. 11.Digoxin 0.25 mg p.o. daily. 12.Keppra 750 mg tablet 2 tablets p.o. b.i.d. 13.Toprol XL 75 mg daily. 14.Colchicine 0.6 mg p.o. daily p.r.n. 15.Pepcid 20 mg daily. 16.Zocor 40 mg daily. 17.Zoloft 100 mg p.o. daily.  The pharmacy at this facility will need to manage the vancomycin dose for a trough of 15-20 and creatinine at the time of discharge is 0.8 and vancomycin dose can be adjusted based for creatinine clearance as well as trough.  We want the trough to be checked in 2 days and serum creatinine to be repeated in 3 days' time.  Rest of his clinical course as dictated previously by Dr. Cleotis Lema.     Zannie Cove, MD     PJ/MEDQ  D:  04/21/2011  T:  04/21/2011  Job:  [id number]  Electronically Signed by Zannie Cove  on 05/05/2011 01:54:39 PM

## 2011-05-13 NOTE — Consult Note (Signed)
NAME:  James [name], James [name] NO.:  [id number]  MEDICAL RECORD NO.:  [id number]  LOCATION:  4526                         FACILITY:  [facility]  PHYSICIAN:  Joycelyn Schmid, MD   DATE OF BIRTH:  Jan 21, 1941  DATE OF CONSULTATION: DATE OF DISCHARGE:                                CONSULTATION   CHIEF COMPLAINT:  Stroke.  HISTORY OF PRESENT ILLNESS:  A 70 year old male with history of alcohol abuse, gout, hypertension, atrial fibrillation, depression, hypercholesterolemia, subdural hematoma status post craniotomy, stroke, who was in his normal state of health until he fell down and sustained a left ankle fracture on March 27, 2011, this in the setting of prior residual right-sided weakness and alcohol abuse.  He underwent open reduction and internal fixation of left ankle fracture on March 27, 2011.  Since that time, his hospital course has been complicated by agitation, alcohol withdrawal, delirium tremens.  Due to ongoing confusion which was initially thought to be due to toxic metabolic encephalopathy, the patient had an MRI scan of the brain which showed a punctate left internal capsule ischemic infarction and significant left temporal parietal encephalomalacia with overlying craniotomy and gliosis.  I was asked to see the patient for further evaluation regarding the stroke.  PAST MEDICAL HISTORY:  Gout, hypertension, atrial fibrillation, depression, hypercholesteremia, subdural hematoma while on Coumadin status post 2 craniotomies, seizures.  SURGICAL HISTORY:  Hernia surgery, 2 craniotomies.  FAMILY HISTORY:  None.  SOCIAL HISTORY:  Pint of alcohol per day.  No tobacco or illicit drug use.  Lives with his wife.  ALLERGIES:  CODEINE, SULFA, TETANUS.  MEDICATIONS:  Currently in the hospital include Unasyn, Lanoxin, Pepcid, folic acid, free water, Lasix, guaifenesin, heparin subcu, Keppra 1500 mg twice a day, metoprolol, morphine sulfate, multivitamin,  Risperdal, Zoloft, thiamine, tramadol.  REVIEW OF SYSTEMS:  The patient cannot communicate to answer review of systems questions.  Per review of chart, only notable for fever and tachycardia.  PHYSICAL EXAMINATION:  VITAL SIGNS:  Blood pressure 130/74, pulse 102, temperature 101, respirations 20, O2 sat 94% on room air. NEUROLOGIC:  The patient is lying in hospital bed, restrained.  He is a Dance movement psychotherapist.  He is quite confused and delirious.  He has a nonsensical speech  occasionally, I am unable to understand a single word.  He is able to repeat some words such as thumb with great difficulty.  Significant expressive aphasia.  He does follow some simple commands, although does not have attention to follow more complex commands.  He is unable to name or read.  Cranial nerve examination, pupils reactive from 2 to 1 mm.  Extraocular muscle testing, limited right gaze and up gaze.  Facial sensation and strength symmetric.  Uvula is midline.  Shoulders symmetric.  Tongue is midline.  Motor examination:  Normal bulk and tone, 4/5 strength in bilateral upper extremities, 2/5 strength in bilateral lower extremities, proximally 4/5 strength in the distal lower extremities.  Sensory emanation, intact to light touch, pinprick, although difficult to assess as the patient is confused and aphasic.  Reflexes 1+ in the upper and lower extremities. CARDIOVASCULAR:  Regular rate and rhythm.  No murmurs.  No carotid bruits.  ASSESSMENT AND PLAN:  A 70 year old male with alcohol abuse, history of stroke, history of subdural hematoma, status post multiple craniotomies, history of atrial fibrillation, and other vascular risk factors.  Now, with confusion and aphasia and has punctate left internal capsule ischemic infarction.  MRI of brain with severe left temporal parietal encephalomalacia from prior infarction.  Suspect the new ischemic infarction is an incidental finding and not related to the  patient's symptoms.  His confusion and aphasia are likely results of prior neurologic problems.  In addition, the patient has ongoing fevers, trace possibility of ongoing infection process which can cause further confusion.  Regarding the mental status, I agree with working up and treating infectious sources.  Agree with multivitamin, thiamine, folic acid.  Regarding stroke, given this is likely an incidental finding, I agree with aspirin treatment, blood pressure control, would check fasting lipid profile.  The patient does not appear to be a Coumadin candidate nor a surgical candidate.  Therefore, further stroke workup with carotid ultrasound, transthoracic echocardiogram would not likely change management.     Joycelyn Schmid, MD     VP/MEDQ  D:  04/13/2011  T:  04/14/2011  Job:  [id number]  Electronically Signed by Joycelyn Schmid  on 05/13/2011 05:45:17 PM

## 2011-08-15 ENCOUNTER — Ambulatory Visit: Payer: Self-pay | Admitting: Internal Medicine

## 2011-08-15 LAB — CREATININE, SERUM: EGFR (African American): >60

## 2012-08-16 ENCOUNTER — Ambulatory Visit: Payer: Self-pay | Admitting: Internal Medicine

## 2012-11-21 IMAGING — CR DG KNEE COMPLETE 4+V*L*
5 series · 5 of 5 positions shown · non-contrast
Comparison: none

CLINICAL DATA: Left ankle fracture

LEFT KNEE - COMPLETE 4+ VIEW

[x knee obl left (1 of 3)]
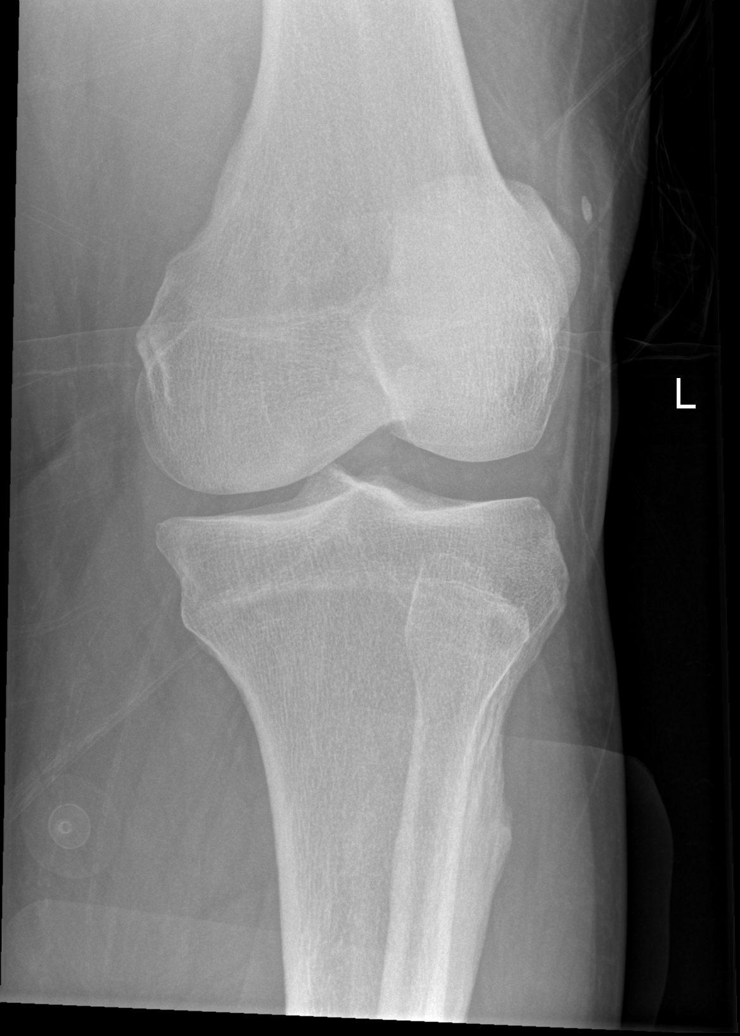

[x knee ap left]
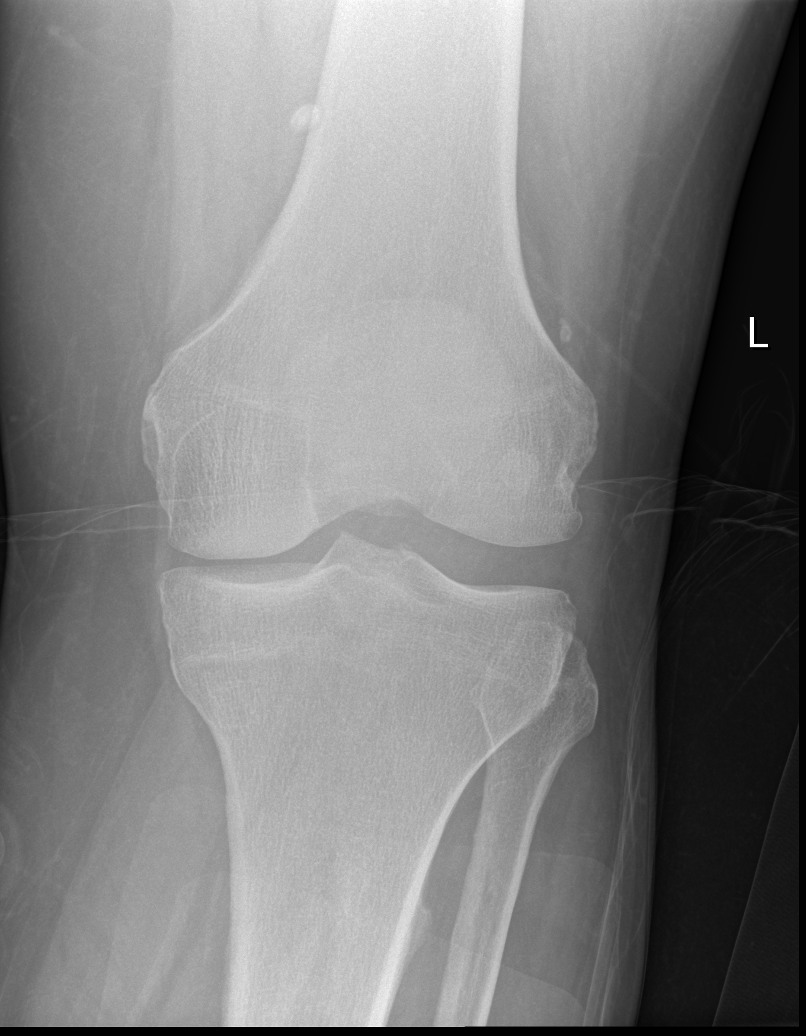

[x knee obl left (2 of 3)]
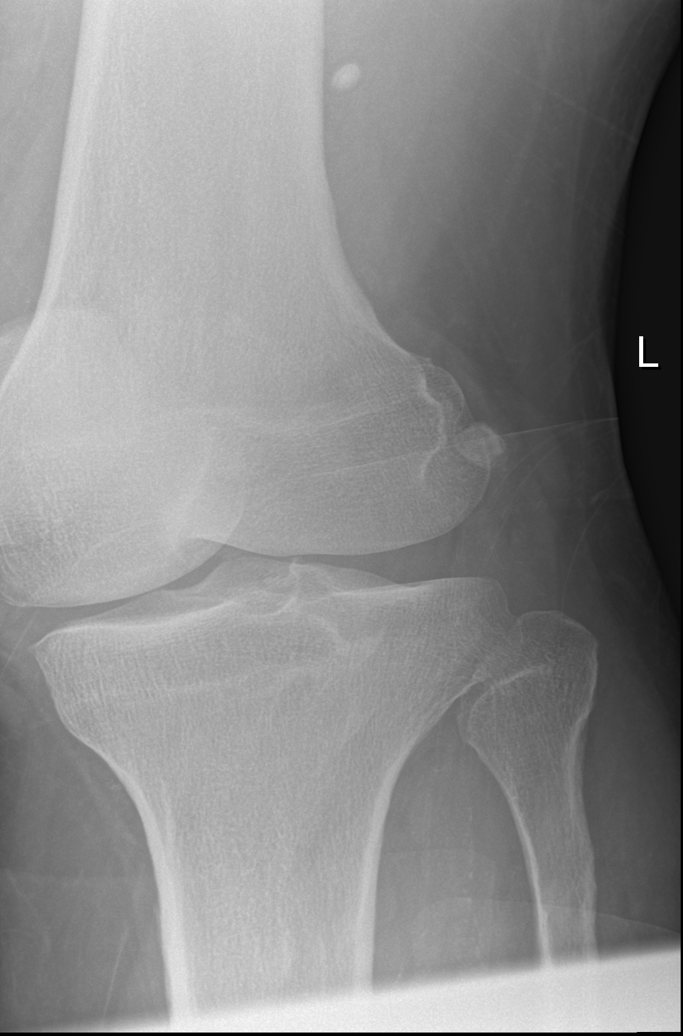

[x knee obl left (3 of 3)]
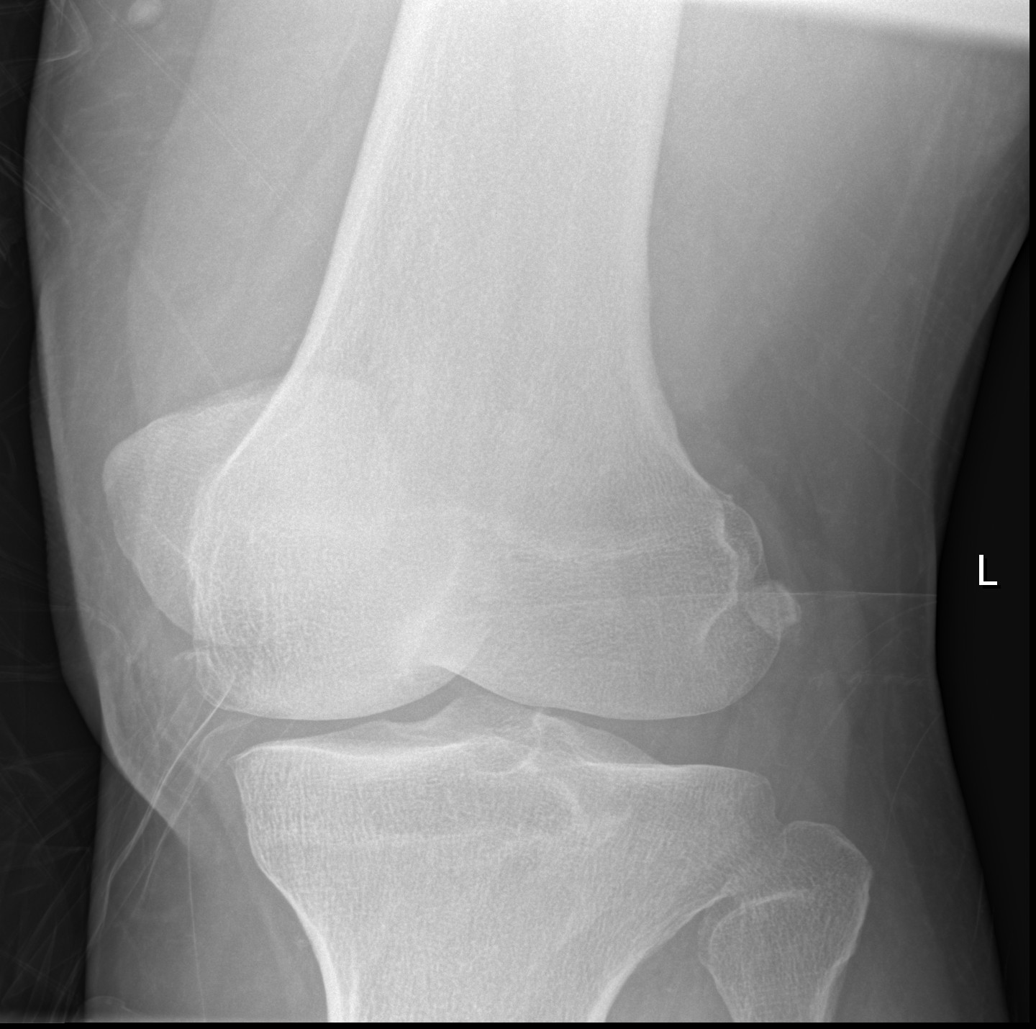

[x knee lat left]
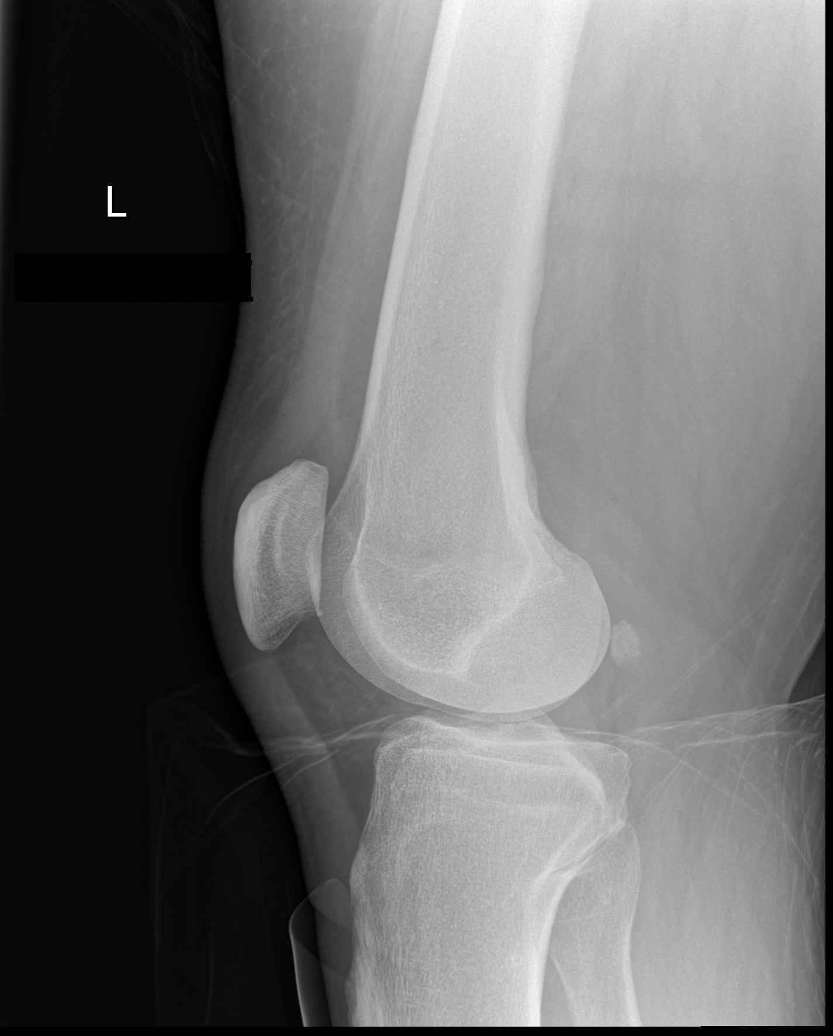

[5 of 5 positions shown; findings below may reference images not displayed]

FINDINGS: No evidence of fracture dislocation of the left knee.  No
joint effusion.
IMPRESSION: No left knee fracture.

## 2012-11-21 IMAGING — CR DG ANKLE COMPLETE 3+V*L*
3 series · 3 of 3 positions shown · non-contrast
Comparison: None.

CLINICAL DATA: Fall, and large remote deformity

LEFT ANKLE COMPLETE - 3+ VIEW

[x ankle lat left (1 of 2)]
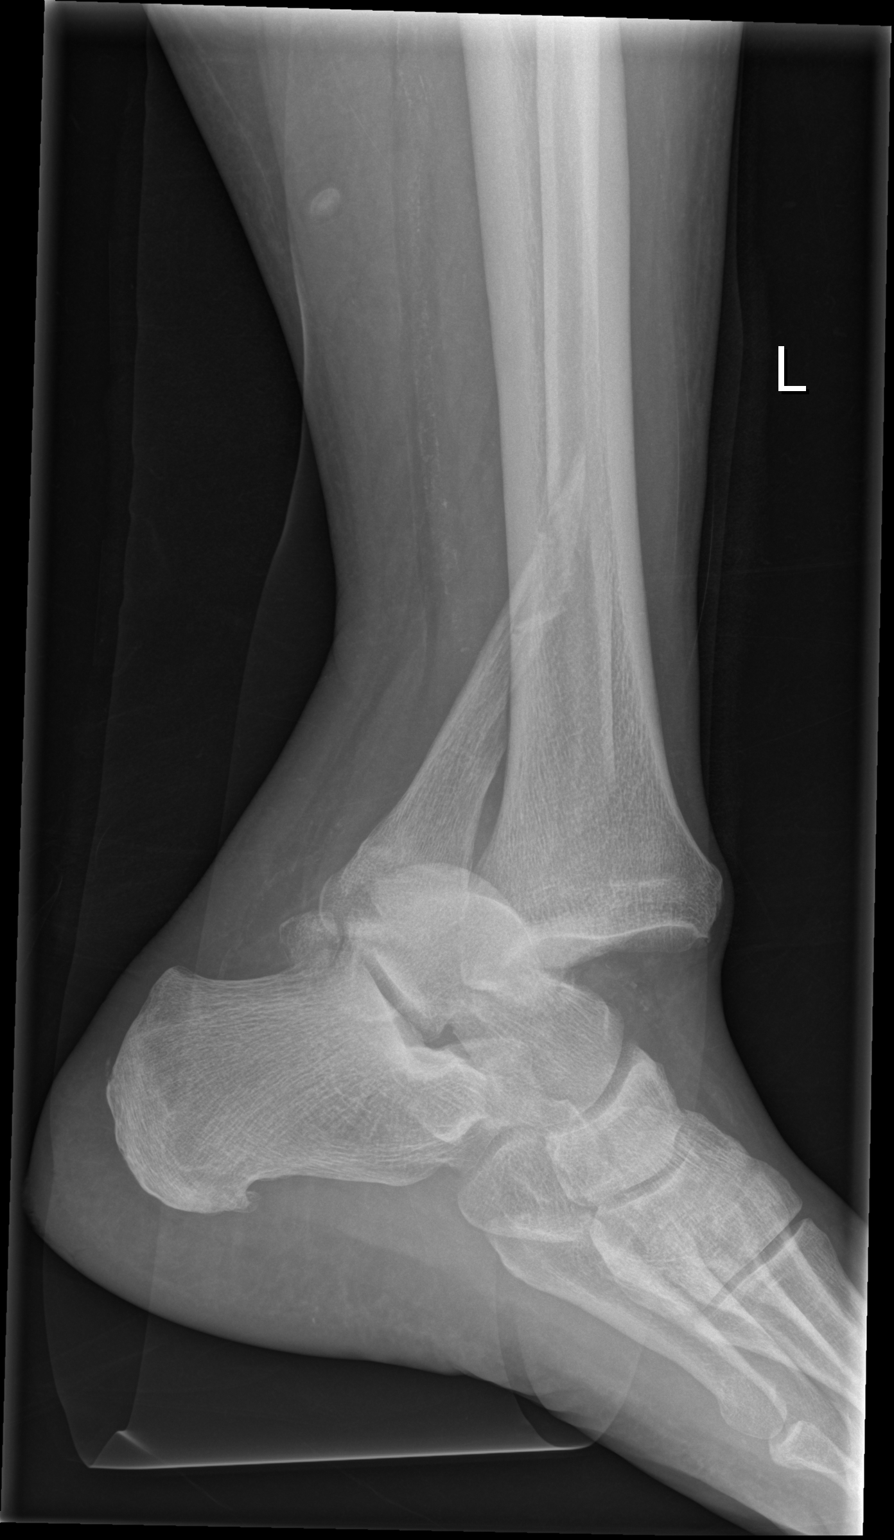

[x ankle lat left (2 of 2)]
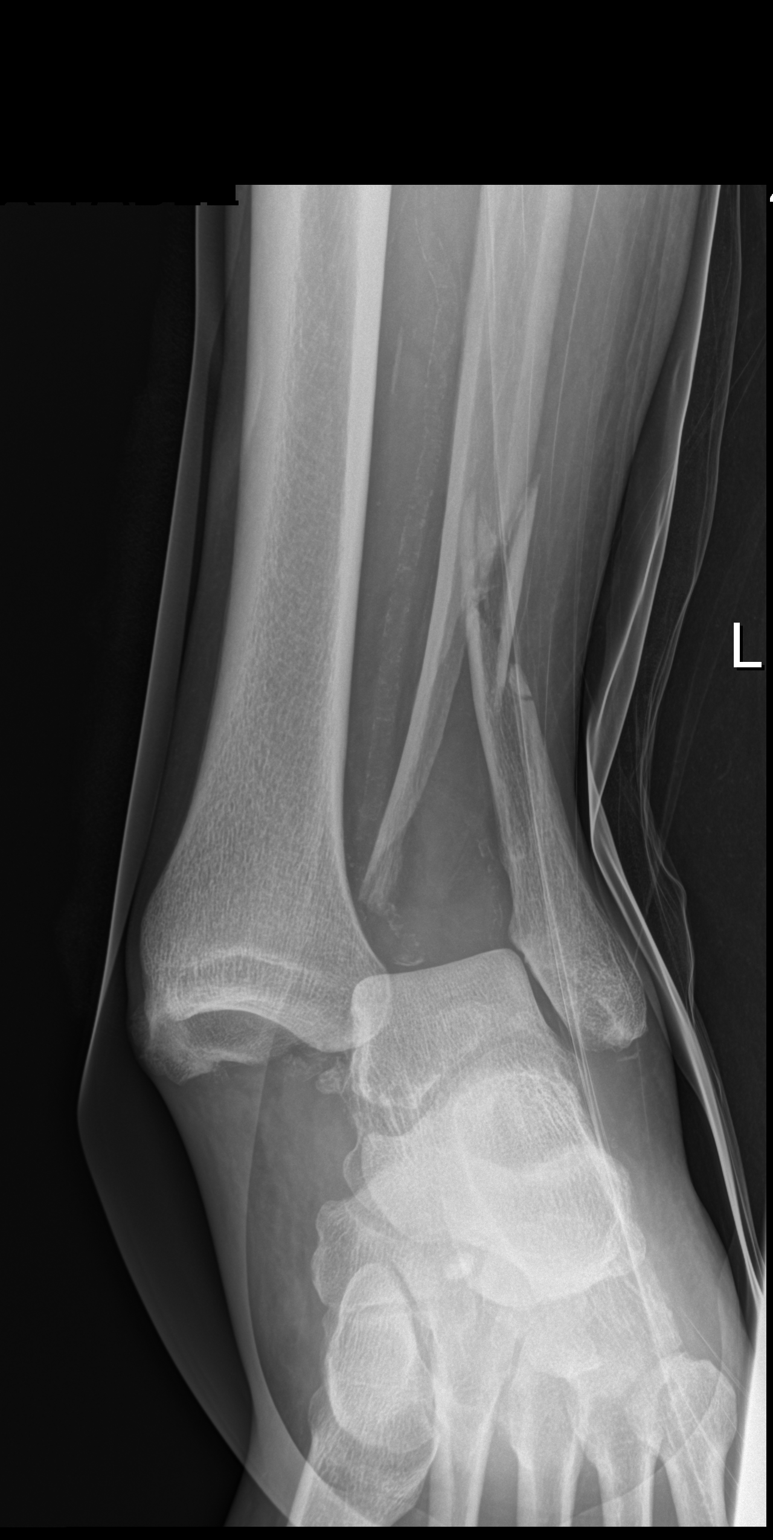

[x ankle obl left]
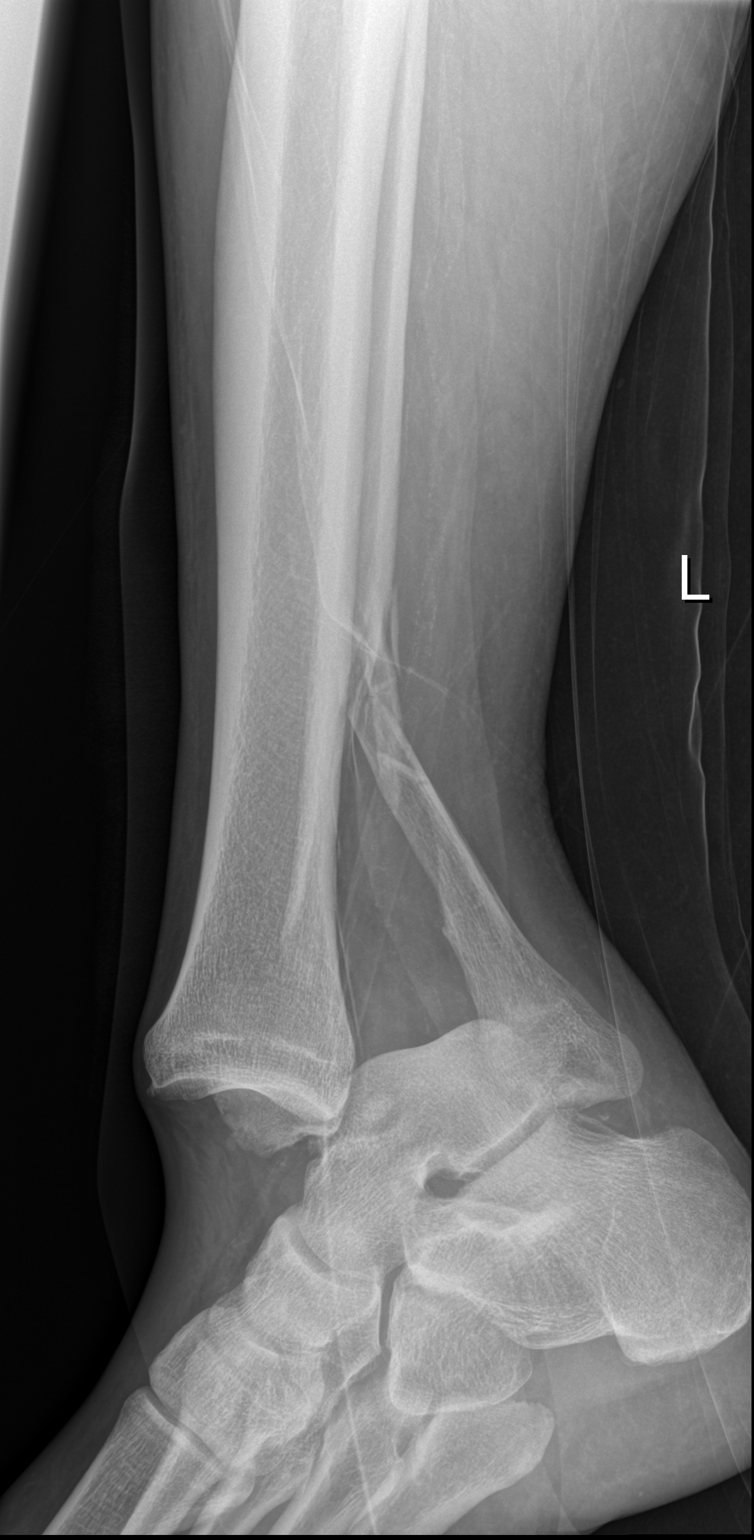

[3 of 3 positions shown; findings below may reference images not displayed]

FINDINGS: The ankle mortise is disrupted.  The talus is displaced
laterally and posteriorly in relation to the tibia.  There is a
comminuted fracture of the fibular shaft.  Calcaneus appears
normal.  There is fragmentation of the medial malleolus.
IMPRESSION: A complex fracture dislocation of the tibiotalar joint.

## 2012-11-26 IMAGING — CR DG CHEST 1V PORT
1 series · 1 of 1 positions shown · non-contrast
Comparison: Chest x-ray 03/27/2011.

CLINICAL DATA: Respiratory distress.

PORTABLE CHEST - 1 VIEW

[view not recorded]
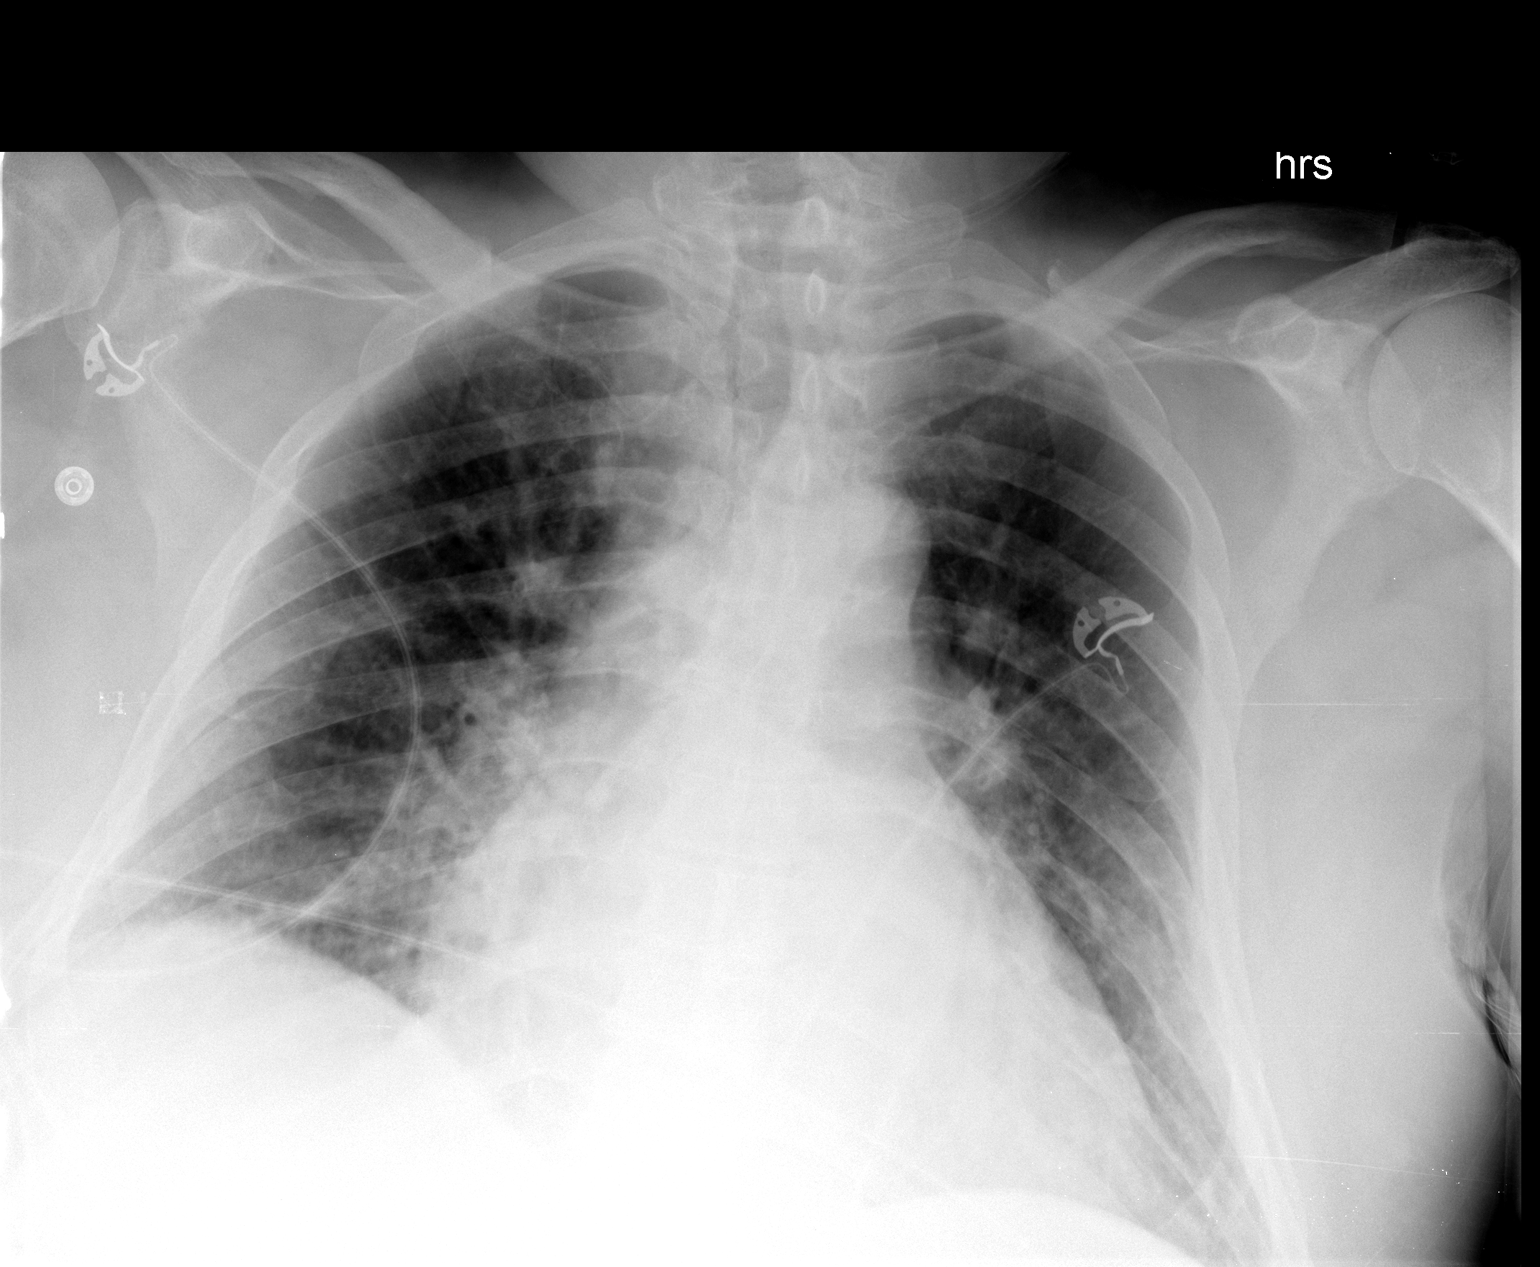

[1 of 1 positions shown; findings below may reference images not displayed]

FINDINGS: The heart is enlarged but stable.  There is vascular
congestion without overt pulmonary edema.  No pleural effusions or
pneumothorax.  Bibasilar atelectasis is noted.
IMPRESSION: Cardiac enlargement and vascular congestion without overt pulmonary
edema.

## 2016-05-10 ENCOUNTER — Other Ambulatory Visit (HOSPITAL_COMMUNITY): Payer: Self-pay | Admitting: Physical Medicine and Rehabilitation

## 2016-05-10 NOTE — Progress Notes (Deleted)
    Physical Medicine and Rehabilitation Admission H&P    No chief complaint on file. : HPI: ***  ROS Past Medical History:  Diagnosis Date  . Hypertension    No past surgical history on file. No family history on file. Social History:  has no tobacco, alcohol, and drug history on file. Allergies:  Allergies  Allergen Reactions  . Bactrim   . Codeine   . Tetanus Toxoids     (Not in a hospital admission)  Home:     Functional History:    Functional Status:  Mobility:          ADL:    Cognition:      Physical Exam: There were no vitals taken for this visit. Physical Exam  No results found for this or any previous visit (from the past 48 hour(s)). No results found.     Medical Problem List and Plan: 1.  *** secondary to *** 2.  DVT Prophylaxis/Anticoagulation: {VTE PROPHYLAXIS/ANTICOAGULATION - TYPE:22655} 3. Pain Management: *** 4. Mood: *** 5. Neuropsych: This patient *** capable of making decisions on *** own behalf. 6. Skin/Wound Care: *** 7. Fluids/Electrolytes/Nutrition: ***      Post Admission Physician Evaluation: 1. Functional deficits secondary  to ***. 2. Patient is admitted to receive collaborative, interdisciplinary care between the physiatrist, rehab nursing staff, and therapy team. 3. Patient's level of medical complexity and substantial therapy needs in context of that medical necessity cannot be provided at a lesser intensity of care such as a SNF. 4. Patient has experienced substantial functional loss from his/her baseline which was documented above under the "Functional History" and "Functional Status" headings.  Judging by the patient's diagnosis, physical exam, and functional history, the patient has potential for functional progress which will result in measurable gains while on inpatient rehab.  These gains will be of substantial and practical use upon discharge  in facilitating mobility and self-care at the household  level. 5. Physiatrist will provide 24 hour management of medical needs as well as oversight of the therapy plan/treatment and provide guidance as appropriate regarding the interaction of the two. 6. 24 hour rehab nursing will assist with {due RE:[id number]  and help integrate therapy concepts, techniques,education, etc. 7. PT will assess and treat for/with: ***.   Goals are: ***. 8. OT will assess and treat for/with: ***.   Goals are: ***. Therapy *** proceed with showering this patient. 9. SLP will assess and treat for/with: ***.  Goals are: ***. 10. Case Management and Social Worker will assess and treat for psychological issues and discharge planning. 11. Team conference will be held weekly to assess progress toward goals and to determine barriers to discharge. 12. Patient will receive at least 3 hours of therapy per day at least 5 days per week. 13. ELOS: ***       14. Prognosis:  {potential:[id number]}     *** 05/10/2016

## 2016-05-11 ENCOUNTER — Encounter: Payer: Self-pay | Admitting: *Deleted

## 2016-05-11 NOTE — PMR Pre-admission (Deleted)
[facility] PMR Admission Coordinator Pre-Admission Assessment  Patient: James [name] is an 75 y.o., male MRN: [id number] DOB: 08-03-1940 Height: 5\' 11"  (180.3 cm) Weight: 90.7 kg (200 lb)  Insurance Information HMO:     PPO: yes     PCP:      IPA:      80/20:      OTHER: Medicare advantage PRIMARY: Blue Medicare    Policy#: [id number]      Subscriber: pt CM Name: Limmie Patricia      Phone#: [phone]     Fax#: [phone] Pre-Cert#: [id number] approved for 7 days      Employer: retired Benefits:  Phone #: [phone]     Name: 05/10/2016 Eff. Date: 07/26/15     Deduct: none      Out of Pocket Max: $5500      Life Max: none CIR: $300 copay per day days 1-6 then covers 100%      SNF: no copay days 1-20: $164.50 per day days 21-100 Outpatient: $40 copay per visit     Co-Pay: visits per medical neccessity Home Health: 100%      Co-Pay: visits per medical neccessity DME: 80%     Co-Pay: 20% Providers: in network  SECONDARY: none       Medicaid Application Date:       Case Manager:  Disability Application Date:       Case Worker:   Emergency Contact Information Jaye Beagle, wife Cell [phone]  Current Medical History  Patient Admitting Diagnosis: TBI after fall  History of Present Illness:  HPI:  James [name] is a 75 year old male with 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 He was found to have 9 mm left IPH, bilateral SDH and SAH. NS recommended conservative care with serial CT. Follow up CT head with increase in bleed. Neurologically reported to be improving but with resultant agitation, nonsensical speech and difficulty with complex tasks.  He was started on rocephin for UTI.   Diet upgraded to regular thin with small bits and cues to clear residual  .  Patient's medical record from [facility] has been reviewed by the rehabilitation admission coordinator and physician.  NIH Stroke scale: Glascow Coma Scale:  Past Medical History  Past Medical History:  Diagnosis Date  . Hypertension     Family History   family history is not on file.  Prior Rehab/Hospitalizations Has the patient had major surgery during 100 days prior to admission? No  AIR 2005 in [place] after initial TBI.  2012 pt admitted to [facility] after a fall with left ankle fx and distal fibula fx. Due to NWB status, pt 's wife chose to d/c to [facility]. Pt at that time was experiencing ETOH withdrawal. Pt has now neen sober for 6 years.  Current Medications Zocor Keppra Lanoxin Pepcid Toprol XL Zoloft Flomax Haldol prn Rocephin for 5 days  Patients Current Diet; Regular diet with thin liquids.   Precautions / Restrictions Precautions Precautions: Fall Restrictions Weight Bearing Restrictions: No   Has the patient had 2 or more falls or a fall with injury in the past year?No  Prior Activity Level Limited Community (1-2x/wk): does not drive. watches alot of tv, like audible books on kindle, I pad for  solitaire and jig saw puzzles. Retired Tax adviser.  Prior Functional Level Self Care: Did the patient need help bathing, dressing, using the toilet or eating?  Independent  Indoor Mobility: Did the patient need assistance with walking from room to room (with or without device)? Independent  Stairs: Did the patient need assistance with internal or external stairs (with or without device)? Independent  Functional Cognition: Did the patient need help planning regular tasks such as shopping or remembering to take medications? Needed some help. Patient managed his own med pill box system , but wife IT sales professional / Equipment N/a  Prior Device Use: Indicate devices/aids used by the patient  prior to current illness, exacerbation or injury? None of the above   Prior Functional Level Current Functional Level  Bed Mobility  Independent  Min assist   Transfers  Independent  Min assist Requires some min assist for steadying  Mobility - Walk/Wheelchair  Independent  Min assist (300 feet min to mod assist with RW) Ataxic, wide base of support. Impulsive with max cues for pacing. LOB noted with walking  Upper Body Dressing  Independent  Min assist   Lower Body Dressing  Independent  Mod assist   Grooming  Independent  Mod Independent   Eating/Drinking  Independent  Mod Independent   Toilet Transfer  Independent  Min assist   Bladder Continence   continent  continent   Bowel Management  continent  continent   Stair Climbing  Independent  Not attempted   Communication  some expressive aphasia since 2005 (calls his wife "grandmother" but does know her name is Clinical cytogeneticist)  global aphasia   Memory  intact  Able to follow 1-2 step directions with extra time.    Cooking/Meal Prep  wife cooks      Housework  no    Money Management  wife manages bills, but pt does ask appropriate questions to oversee    Driving  Not since [id number]      Wife reports chronic visual issues as a result of previous injuries/CVA, reports increased difficulty with depth perception. Chronic strength deiecits in RUE. Some limitations by fatigue. Patient with difficulty navigating around objects in halls, required cues to avoid.  Special needs/care consideration BiPAP/CPAP  N/a CPM  N/a Continuous Drip IV n/a Dialysis n/a Life Vest  N/a Oxygen  N/a Special Bed  N/a Trach Size  N/a Wound Vac (area) n/a Skin no reported areas Bowel mgmt: continent Bladder mgmt: dysuria some incontinence Diabetic mgmt  N/a  Previous Home Environment Living Arrangements: Spouse/significant other  Lives With: Spouse Available Help at Discharge: Family, Available 24 hours/day (wife avialble  24/7 except one day a week she does volunteer ) Type of Home: Independent living facility [facility]) Home Layout: One level Home Access: Level entry (level in front, 3 steps in back in their [place]) Bathroom Shower/Tub: Walk-in shower (with 2 inch step up) Bathroom Toilet: Handicapped height Bathroom Accessibility: Yes How Accessible: Accessible via walker Home Care Services: No Additional Comments: grab bars with call light in bathroom  Discharge Living Setting Plans for Discharge Living Setting: Patient's home, Lives with (comment) (wife) Type of Home at Discharge: [place] Name at Discharge: [place] Discharge Home Layout: One level Discharge Home Access: Level entry (level entry in front and 3 steps in back) Discharge Bathroom Shower/Tub: Walk-in shower (with two inch step into shower) Discharge Bathroom Toilet: Handicapped height Discharge Bathroom Accessibility: Yes How Accessible:  Accessible via walker Does the patient have any problems obtaining your medications?: No  Social/Family/Support Systems Patient Roles: Spouse (no children) Contact Information: Jaye Beagle, wife Anticipated Caregiver: wife and sister in law Anticipated Caregiver's Contact Information: see above Ability/Limitations of Caregiver: wife works 4 hrs as Psychologist, occupational one day per week Caregiver Availability: 24/7 Discharge Plan Discussed with Primary Caregiver: Yes Is Caregiver In Agreement with Plan?: Yes Does Caregiver/Family have Issues with Lodging/Transportation while Pt is in Rehab?: No  Goals/Additional Needs Patient/Family Goal for Rehab: Mod I to supervision with PT, OT, and SLP Expected length of stay: ELOS 10- 12 days Equipment Needs: bed and chair alarm due to decreased safety awareness Pt/Family Agrees to Admission and willing to participate: Yes Program Orientation Provided & Reviewed with Pt/Caregiver Including Roles  & Responsibilities:  Yes  Patient Condition: I have reviewed the medical records from [facility] as well as interviewed patient's wife by phone. Pt previously Independent but retired pta. History of old TBI 2005. Acute PT, OT, and SLP assessed patient with inpatient acute rehabilitation recommendations. Patient will benefit form an inpatient acute rehabilitation admission for therapies, rehabilitation Nursing and Rehabilitative Medicine coordinated efforts to return to the community. I have reviewed and discussed findings with the Rehab MD and have approval for admission on 05/12/2106.  Preadmission Screen Completed By:  Cleatrice Burke, 05/11/2016 3:54 PM ______________________________________________________________________   Discussed status with Dr. Letta Pate on 05/12/2016 at  and received telephone approval for admission today.  Admission Coordinator:  Cleatrice Burke, time [id number] Date 05/12/2016   Assessment/Plan: Diagnosis:TBI, IPH, SDH, SAH 1. Does the need for close, 24 hr/day  Medical supervision in concert with the patient's rehab needs make it unreasonable for this patient to be served in a less intensive setting? Yes 2. Co-Morbidities requiring supervision/potential complications: Prior TBI , gait imbalance, R HP, Aphasia, ETOH abuse 3. Due to bladder management, bowel management, safety, skin/wound care, disease management, medication administration, pain management and patient education, does the patient require 24 hr/day rehab nursing? Yes 4. Does the patient require coordinated care of a physician, rehab nurse, PT (1-2 hrs/day, 5 days/week), OT (1-2 hrs/day, 5 days/week) and SLP (.5-1 hrs/day, 5 days/week) to address physical and functional deficits in the context of the above medical diagnosis(es)? Yes Addressing deficits in the following areas: balance, endurance, locomotion, strength, transferring, bowel/bladder control, bathing, dressing, feeding, grooming, toileting,  cognition, speech, language, swallowing and psychosocial support 5. Can the patient actively participate in an intensive therapy program of at least 3 hrs of therapy 5 days a week? Yes 6. The potential for patient to make measurable gains while on inpatient rehab is good 7. Anticipated functional outcomes upon discharge from inpatients are: supervision PT, supervision OT, supervision SLP 8. Estimated rehab length of stay to reach the above functional goals is: 10-14d 9. Does the patient have adequate social supports to accommodate these discharge functional goals? Yes 10. Anticipated D/C setting: Home 11. Anticipated post D/C treatments: [place] therapy 12. Overall Rehab/Functional Prognosis: good    RECOMMENDATIONS: This patient's condition is appropriate for continued rehabilitative care in the following setting: CIR Patient has agreed to participate in recommended program. Yes Note that insurance prior authorization may be required for reimbursement for recommended care.  Comment:  Cleatrice Burke 05/11/2016

## 2016-05-11 NOTE — PMR Pre-admission (Signed)
[facility] PMR Admission Coordinator Pre-Admission Assessment  Patient: James [name] is an 75 y.o., male MRN: [id number] DOB: Jul 22, 1941 Height: 5\' 11"  (180.3 cm) Weight: 90.7 kg (200 lb)  Insurance Information HMO:     PPO: yes     PCP:      IPA:      80/20:      OTHER: Medicare advantage PRIMARY: Blue Medicare    Policy#: [id number]      Subscriber: pt CM Name: James [name]      Phone#: [phone]     Fax#: [phone] Pre-Cert#: [id number] approved for 7 days      Employer: retired Benefits:  Phone #: [phone]     Name: 05/10/2016 Eff. Date: 07/26/15     Deduct: none      Out of Pocket Max: $5500      Life Max: none CIR: $300 copay per day days 1-6 then covers 100%      SNF: no copay days 1-20: $164.50 per day days 21-100 Outpatient: $40 copay per visit     Co-Pay: visits per medical neccessity Home Health: 100%      Co-Pay: visits per medical neccessity DME: 80%     Co-Pay: 20% Providers: in network  SECONDARY: none       Medicaid Application Date:       Case Manager:  Disability Application Date:       Case Worker:   Emergency Contact Information James [name], wife Cell [phone]  Current Medical History  Patient Admitting Diagnosis: TBI after fall  History of Present Illness:  HPI:  James [name] is a 75 year old male with 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 He was found to have 9 mm left IPH, bilateral SDH and SAH. NS recommended conservative care with serial CT. Follow up CT head with increase in bleed. Neurologically reported to be improving but with resultant agitation, nonsensical speech and difficulty with complex tasks.  He was started on rocephin for UTI.   Diet upgraded to regular thin with small bits and cues to clear residual  .  Patient's medical record from [facility] has been reviewed by the rehabilitation admission coordinator and physician.  NIH Stroke scale: Glascow Coma Scale:  Past Medical History  Past Medical History:  Diagnosis Date  . Hypertension     Family History   family history is not on file.  Prior Rehab/Hospitalizations Has the patient had major surgery during 100 days prior to admission? No  AIR 2005 in [place] after initial TBI.  2012 pt admitted to [facility] after a fall with left ankle fx and distal fibula fx. Due to NWB status, pt 's wife chose to d/c to [facility]. Pt at that time was experiencing ETOH withdrawal. Pt has now neen sober for 6 years.  Current Medications Zocor Keppra Lanoxin Pepcid Toprol XL Zoloft Flomax Haldol prn Rocephin for 5 days  Patients Current Diet; Regular diet with thin liquids.   Precautions / Restrictions Precautions Precautions: Fall Restrictions Weight Bearing Restrictions: No   Has the patient had 2 or more falls or a fall with injury in the past year?No  Prior Activity Level Limited Community (1-2x/wk): does not drive. watches alot of tv, like audible books on kindle, I pad for  solitaire and jig saw puzzles. Retired Tax adviser.  Prior Functional Level Self Care: Did the patient need help bathing, dressing, using the toilet or eating?  Independent  Indoor Mobility: Did the patient need assistance with walking from room to room (with or without device)? Independent  Stairs: Did the patient need assistance with internal or external stairs (with or without device)? Independent  Functional Cognition: Did the patient need help planning regular tasks such as shopping or remembering to take medications? Needed some help. Patient managed his own med pill box system , but wife IT sales professional / Equipment N/a  Prior Device Use: Indicate devices/aids used by the patient  prior to current illness, exacerbation or injury? None of the above   Prior Functional Level Current Functional Level  Bed Mobility  Independent  Min assist   Transfers  Independent  Min assist Requires some min assist for steadying  Mobility - Walk/Wheelchair  Independent  Min assist (300 feet min to mod assist with RW) Ataxic, wide base of support. Impulsive with max cues for pacing. LOB noted with walking  Upper Body Dressing  Independent  Min assist   Lower Body Dressing  Independent  Mod assist   Grooming  Independent  Mod Independent   Eating/Drinking  Independent  Mod Independent   Toilet Transfer  Independent  Min assist   Bladder Continence   continent  continent   Bowel Management  continent  continent   Stair Climbing  Independent  Not attempted   Communication  some expressive aphasia since 2005 (calls his wife "grandmother" but does know her name is Clinical cytogeneticist)  global aphasia   Memory  intact  Able to follow 1-2 step directions with extra time.    Cooking/Meal Prep  wife cooks      Housework  no    Money Management  wife manages bills, but pt does ask appropriate questions to oversee    Driving  Not since [id number]      Wife reports chronic visual issues as a result of previous injuries/CVA, reports increased difficulty with depth perception. Chronic strength deiecits in RUE. Some limitations by fatigue. Patient with difficulty navigating around objects in halls, required cues to avoid.  Special needs/care consideration BiPAP/CPAP  N/a CPM  N/a Continuous Drip IV n/a Dialysis n/a Life Vest  N/a Oxygen  N/a Special Bed  N/a Trach Size  N/a Wound Vac (area) n/a Skin no reported areas Bowel mgmt: continent Bladder mgmt: dysuria some incontinence Diabetic mgmt  N/a  Previous Home Environment Living Arrangements: Spouse/significant other  Lives With: Spouse Available Help at Discharge: Family, Available 24 hours/day (wife avialble  24/7 except one day a week she does volunteer ) Type of Home: Independent living facility [facility] IL[facility]) Home Layout: One level Home Access: Level entry (level in front, 3 steps in back in their [place]) Bathroom Shower/Tub: Walk-in shower (with 2 inch step up) Bathroom Toilet: Handicapped height Bathroom Accessibility: Yes How Accessible: Accessible via walker Home Care Services: No Additional Comments: grab bars with call light in bathroom  Discharge Living Setting Plans for Discharge Living Setting: Patient's home, Lives with (comment) (wife) Type of Home at Discharge: [place] Name at Discharge: [place] Discharge Home Layout: One level Discharge Home Access: Level entry (level entry in front and 3 steps in back) Discharge Bathroom Shower/Tub: Walk-in shower (with two inch step into shower) Discharge Bathroom Toilet: Handicapped height Discharge Bathroom Accessibility: Yes How Accessible:  Accessible via walker Does the patient have any problems obtaining your medications?: No  Social/Family/Support Systems Patient Roles: Spouse (no children) Contact Information: James [name], wife Anticipated Caregiver: wife and sister in law Anticipated Caregiver's Contact Information: see above Ability/Limitations of Caregiver: wife works 4 hrs as Psychologist, occupational one day per week Caregiver Availability: 24/7 Discharge Plan Discussed with Primary Caregiver: Yes Is Caregiver In Agreement with Plan?: Yes Does Caregiver/Family have Issues with Lodging/Transportation while Pt is in Rehab?: No  Goals/Additional Needs Patient/Family Goal for Rehab: Mod I to supervision with PT, OT, and SLP Expected length of stay: ELOS 10- 12 days Equipment Needs: bed and chair alarm due to decreased safety awareness Pt/Family Agrees to Admission and willing to participate: Yes Program Orientation Provided & Reviewed with Pt/Caregiver Including Roles  & Responsibilities:  Yes  Patient Condition: I have reviewed the medical records from [facility] as well as interviewed patient's wife by phone. Pt previously Independent but retired pta. History of old TBI 2005. Acute PT, OT, and SLP assessed patient with inpatient acute rehabilitation recommendations. Patient will benefit form an inpatient acute rehabilitation admission for therapies, rehabilitation Nursing and Rehabilitative Medicine coordinated efforts to return to the community. I have reviewed and discussed findings with the Rehab MD and have approval for admission on 05/12/2106.  Preadmission Screen Completed By:  Cleatrice Burke, 05/11/2016 3:54 PM ______________________________________________________________________   Discussed status with Dr. Letta Pate on 05/12/2016 at  and received telephone approval for admission today.  Admission Coordinator:  Cleatrice Burke, time [id number] Date 05/12/2016   Assessment/Plan: Diagnosis:TBI, IPH, SDH, SAH 1. Does the need for close, 24 hr/day  Medical supervision in concert with the patient's rehab needs make it unreasonable for this patient to be served in a less intensive setting? Yes 2. Co-Morbidities requiring supervision/potential complications: Prior TBI , gait imbalance, R HP, Aphasia, ETOH abuse 3. Due to bladder management, bowel management, safety, skin/wound care, disease management, medication administration, pain management and patient education, does the patient require 24 hr/day rehab nursing? Yes 4. Does the patient require coordinated care of a physician, rehab nurse, PT (1-2 hrs/day, 5 days/week), OT (1-2 hrs/day, 5 days/week) and SLP (.5-1 hrs/day, 5 days/week) to address physical and functional deficits in the context of the above medical diagnosis(es)? Yes Addressing deficits in the following areas: balance, endurance, locomotion, strength, transferring, bowel/bladder control, bathing, dressing, feeding, grooming, toileting,  cognition, speech, language, swallowing and psychosocial support 5. Can the patient actively participate in an intensive therapy program of at least 3 hrs of therapy 5 days a week? Yes 6. The potential for patient to make measurable gains while on inpatient rehab is good 7. Anticipated functional outcomes upon discharge from inpatients are: supervision PT, supervision OT, supervision SLP 8. Estimated rehab length of stay to reach the above functional goals is: 10-14d 9. Does the patient have adequate social supports to accommodate these discharge functional goals? Yes 10. Anticipated D/C setting: Home 11. Anticipated post D/C treatments: [place] therapy 12. Overall Rehab/Functional Prognosis: good    RECOMMENDATIONS: This patient's condition is appropriate for continued rehabilitative care in the following setting: CIR Patient has agreed to participate in recommended program. Yes Note that insurance prior authorization may be required for reimbursement for recommended care.  Comment:  Cleatrice Burke 05/11/2016

## 2016-05-12 ENCOUNTER — Encounter (HOSPITAL_COMMUNITY): Payer: Self-pay | Admitting: Physical Medicine and Rehabilitation

## 2016-05-12 ENCOUNTER — Encounter (HOSPITAL_COMMUNITY): Payer: Self-pay | Admitting: *Deleted

## 2016-05-12 ENCOUNTER — Inpatient Hospital Stay (HOSPITAL_COMMUNITY)
Admission: RE | Admit: 2016-05-12 | Discharge: 2016-05-27 | DRG: 949 | Disposition: A | Discharge status: Skilled Nursing Facility | Payer: Medicare Other | Source: Other Acute Inpatient Hospital | Attending: Physical Medicine & Rehabilitation | Admitting: Physical Medicine & Rehabilitation

## 2016-05-12 DIAGNOSIS — I482 Chronic atrial fibrillation: Secondary | ICD-10-CM

## 2016-05-12 DIAGNOSIS — E871 Hypo-osmolality and hyponatremia: Secondary | ICD-10-CM | POA: Diagnosis present

## 2016-05-12 DIAGNOSIS — R482 Apraxia: Secondary | ICD-10-CM | POA: Diagnosis not present

## 2016-05-12 DIAGNOSIS — Z87820 Personal history of traumatic brain injury: Secondary | ICD-10-CM | POA: Diagnosis not present

## 2016-05-12 DIAGNOSIS — Z66 Do not resuscitate: Secondary | ICD-10-CM | POA: Diagnosis not present

## 2016-05-12 DIAGNOSIS — R269 Unspecified abnormalities of gait and mobility: Secondary | ICD-10-CM | POA: Diagnosis not present

## 2016-05-12 DIAGNOSIS — R1310 Dysphagia, unspecified: Secondary | ICD-10-CM | POA: Diagnosis not present

## 2016-05-12 DIAGNOSIS — R339 Retention of urine, unspecified: Secondary | ICD-10-CM | POA: Diagnosis not present

## 2016-05-12 DIAGNOSIS — S065X9D Traumatic subdural hemorrhage with loss of consciousness of unspecified duration, subsequent encounter: Principal | ICD-10-CM

## 2016-05-12 DIAGNOSIS — I481 Persistent atrial fibrillation: Secondary | ICD-10-CM | POA: Diagnosis not present

## 2016-05-12 DIAGNOSIS — I609 Nontraumatic subarachnoid hemorrhage, unspecified: Secondary | ICD-10-CM | POA: Diagnosis not present

## 2016-05-12 DIAGNOSIS — Z8614 Personal history of Methicillin resistant Staphylococcus aureus infection: Secondary | ICD-10-CM

## 2016-05-12 DIAGNOSIS — S065XAA Traumatic subdural hemorrhage with loss of consciousness status unknown, initial encounter: Secondary | ICD-10-CM | POA: Diagnosis present

## 2016-05-12 DIAGNOSIS — S065X9A Traumatic subdural hemorrhage with loss of consciousness of unspecified duration, initial encounter: Secondary | ICD-10-CM | POA: Diagnosis present

## 2016-05-12 DIAGNOSIS — I959 Hypotension, unspecified: Secondary | ICD-10-CM

## 2016-05-12 DIAGNOSIS — G479 Sleep disorder, unspecified: Secondary | ICD-10-CM | POA: Diagnosis not present

## 2016-05-12 DIAGNOSIS — W109XXD Fall (on) (from) unspecified stairs and steps, subsequent encounter: Secondary | ICD-10-CM | POA: Diagnosis not present

## 2016-05-12 DIAGNOSIS — R4701 Aphasia: Secondary | ICD-10-CM | POA: Diagnosis not present

## 2016-05-12 DIAGNOSIS — G40909 Epilepsy, unspecified, not intractable, without status epilepticus: Secondary | ICD-10-CM

## 2016-05-12 DIAGNOSIS — S098XXA Other specified injuries of head, initial encounter: Secondary | ICD-10-CM | POA: Diagnosis not present

## 2016-05-12 DIAGNOSIS — R4189 Other symptoms and signs involving cognitive functions and awareness: Secondary | ICD-10-CM | POA: Diagnosis not present

## 2016-05-12 DIAGNOSIS — S065X0A Traumatic subdural hemorrhage without loss of consciousness, initial encounter: Secondary | ICD-10-CM | POA: Diagnosis not present

## 2016-05-12 DIAGNOSIS — S062X9D Diffuse traumatic brain injury with loss of consciousness of unspecified duration, subsequent encounter: Secondary | ICD-10-CM | POA: Diagnosis not present

## 2016-05-12 DIAGNOSIS — E876 Hypokalemia: Secondary | ICD-10-CM

## 2016-05-12 DIAGNOSIS — N390 Urinary tract infection, site not specified: Secondary | ICD-10-CM

## 2016-05-12 DIAGNOSIS — Z87440 Personal history of urinary (tract) infections: Secondary | ICD-10-CM

## 2016-05-12 DIAGNOSIS — R4702 Dysphasia: Secondary | ICD-10-CM | POA: Diagnosis not present

## 2016-05-12 DIAGNOSIS — I10 Essential (primary) hypertension: Secondary | ICD-10-CM | POA: Diagnosis present

## 2016-05-12 DIAGNOSIS — Z5309 Procedure and treatment not carried out because of other contraindication: Secondary | ICD-10-CM | POA: Diagnosis not present

## 2016-05-12 DIAGNOSIS — S065X1A Traumatic subdural hemorrhage with loss of consciousness of 30 minutes or less, initial encounter: Secondary | ICD-10-CM | POA: Diagnosis not present

## 2016-05-12 DIAGNOSIS — D72829 Elevated white blood cell count, unspecified: Secondary | ICD-10-CM

## 2016-05-12 DIAGNOSIS — S066X9D Traumatic subarachnoid hemorrhage with loss of consciousness of unspecified duration, subsequent encounter: Secondary | ICD-10-CM

## 2016-05-12 DIAGNOSIS — S065X3S Traumatic subdural hemorrhage with loss of consciousness of 1 hour to 5 hours 59 minutes, sequela: Secondary | ICD-10-CM | POA: Diagnosis not present

## 2016-05-12 DIAGNOSIS — G8191 Hemiplegia, unspecified affecting right dominant side: Secondary | ICD-10-CM | POA: Diagnosis not present

## 2016-05-12 DIAGNOSIS — S065X2S Traumatic subdural hemorrhage with loss of consciousness of 31 minutes to 59 minutes, sequela: Secondary | ICD-10-CM | POA: Diagnosis not present

## 2016-05-12 DIAGNOSIS — R8290 Unspecified abnormal findings in urine: Secondary | ICD-10-CM | POA: Diagnosis not present

## 2016-05-12 DIAGNOSIS — I480 Paroxysmal atrial fibrillation: Secondary | ICD-10-CM

## 2016-05-12 DIAGNOSIS — B952 Enterococcus as the cause of diseases classified elsewhere: Secondary | ICD-10-CM | POA: Diagnosis not present

## 2016-05-12 DIAGNOSIS — D72820 Lymphocytosis (symptomatic): Secondary | ICD-10-CM | POA: Diagnosis not present

## 2016-05-12 HISTORY — DX: Unspecified atrial fibrillation: I48.91

## 2016-05-12 MED ORDER — HYDROCODONE-ACETAMINOPHEN 5-325 MG PO TABS
1.0000 | ORAL_TABLET | Freq: Four times a day (QID) | ORAL | Status: DC | PRN
  Administered 2016-05-17 – 2016-05-20 (×6): 1 via ORAL
  Filled 2016-05-12 (×6): qty 1

## 2016-05-12 MED ORDER — DIPHENHYDRAMINE HCL 12.5 MG/5ML PO ELIX: 12.5000 mg | ORAL_SOLUTION | Freq: Four times a day (QID) | ORAL | Status: DC | PRN

## 2016-05-12 MED ORDER — LEVETIRACETAM 500 MG PO TABS
500.0000 mg | ORAL_TABLET | Freq: Two times a day (BID) | ORAL | Status: DC
  Administered 2016-05-12 – 2016-05-13 (×3): 500 mg via ORAL
  Filled 2016-05-12 (×3): qty 1

## 2016-05-12 MED ORDER — PROCHLORPERAZINE EDISYLATE 5 MG/ML IJ SOLN: 5.0000 mg | Freq: Four times a day (QID) | INTRAMUSCULAR | Status: DC | PRN

## 2016-05-12 MED ORDER — TRAZODONE HCL 50 MG PO TABS
25.0000 mg | ORAL_TABLET | Freq: Every evening | ORAL | Status: DC | PRN
  Administered 2016-05-19: 50 mg via ORAL
  Filled 2016-05-12: qty 1

## 2016-05-12 MED ORDER — SERTRALINE HCL 100 MG PO TABS
100.0000 mg | ORAL_TABLET | Freq: Every day | ORAL | Status: DC
  Administered 2016-05-13 – 2016-05-19 (×7): 100 mg via ORAL
  Filled 2016-05-12 (×8): qty 1

## 2016-05-12 MED ORDER — METOPROLOL SUCCINATE ER 50 MG PO TB24
50.0000 mg | ORAL_TABLET | Freq: Every day | ORAL | Status: DC
  Administered 2016-05-13: 50 mg via ORAL
  Filled 2016-05-12: qty 1

## 2016-05-12 MED ORDER — DIGOXIN 125 MCG PO TABS
0.1250 mg | ORAL_TABLET | Freq: Every day | ORAL | Status: DC
  Administered 2016-05-13 – 2016-05-27 (×15): 0.125 mg via ORAL
  Filled 2016-05-12 (×15): qty 1

## 2016-05-12 MED ORDER — ENOXAPARIN SODIUM 40 MG/0.4ML ~~LOC~~ SOLN
40.0000 mg | SUBCUTANEOUS | Status: DC
  Administered 2016-05-12: 40 mg via SUBCUTANEOUS
  Filled 2016-05-12: qty 0.4

## 2016-05-12 MED ORDER — SIMVASTATIN 40 MG PO TABS
40.0000 mg | ORAL_TABLET | Freq: Every day | ORAL | Status: DC
  Administered 2016-05-12 – 2016-05-26 (×14): 40 mg via ORAL
  Filled 2016-05-12 (×15): qty 1

## 2016-05-12 MED ORDER — TAMSULOSIN HCL 0.4 MG PO CAPS
0.4000 mg | ORAL_CAPSULE | Freq: Every day | ORAL | Status: DC
  Administered 2016-05-12 – 2016-05-13 (×2): 0.4 mg via ORAL
  Filled 2016-05-12 (×3): qty 1

## 2016-05-12 MED ORDER — PROCHLORPERAZINE 25 MG RE SUPP: 12.5000 mg | Freq: Four times a day (QID) | RECTAL | Status: DC | PRN

## 2016-05-12 MED ORDER — METHOCARBAMOL 500 MG PO TABS: 500.0000 mg | ORAL_TABLET | Freq: Four times a day (QID) | ORAL | Status: DC | PRN

## 2016-05-12 MED ORDER — GUAIFENESIN-DM 100-10 MG/5ML PO SYRP: 5.0000 mL | ORAL_SOLUTION | Freq: Four times a day (QID) | ORAL | Status: DC | PRN

## 2016-05-12 MED ORDER — FLEET ENEMA 7-19 GM/118ML RE ENEM: 1.0000 | ENEMA | Freq: Once | RECTAL | Status: DC | PRN

## 2016-05-12 MED ORDER — ACETAMINOPHEN 325 MG PO TABS
325.0000 mg | ORAL_TABLET | ORAL | Status: DC | PRN
  Administered 2016-05-15: 650 mg via ORAL
  Filled 2016-05-12: qty 2

## 2016-05-12 MED ORDER — LIDOCAINE HCL 2 % EX GEL
CUTANEOUS | Status: DC | PRN
  Administered 2016-05-26: 5 via TOPICAL
  Administered 2016-05-27: 1 via TOPICAL
  Filled 2016-05-12 (×6): qty 5

## 2016-05-12 MED ORDER — FAMOTIDINE 20 MG PO TABS
20.0000 mg | ORAL_TABLET | Freq: Two times a day (BID) | ORAL | Status: DC
  Administered 2016-05-12 – 2016-05-27 (×30): 20 mg via ORAL
  Filled 2016-05-12 (×30): qty 1

## 2016-05-12 MED ORDER — BISACODYL 10 MG RE SUPP
10.0000 mg | Freq: Every day | RECTAL | Status: DC | PRN
  Administered 2016-05-19: 10 mg via RECTAL
  Filled 2016-05-12 (×2): qty 1

## 2016-05-12 MED ORDER — ALUM & MAG HYDROXIDE-SIMETH 200-200-20 MG/5ML PO SUSP
30.0000 mL | ORAL | Status: DC | PRN
  Filled 2016-05-12: qty 30

## 2016-05-12 MED ORDER — PROCHLORPERAZINE MALEATE 5 MG PO TABS: 5.0000 mg | ORAL_TABLET | Freq: Four times a day (QID) | ORAL | Status: DC | PRN

## 2016-05-12 NOTE — Progress Notes (Signed)
This note is locked. Cristina Gong, RN has had an incomplete edit since 05/12/2016 8:42 AM.  Cristina Gong, RN Rehab Admission Coordinator [facility]  PMR Pre-admission Date of Service: 05/11/2016 3:54 PM  Related encounter: Documentation from 05/11/2016 in [place]       [] Hide copied text   [facility] PMR Admission Coordinator Pre-Admission Assessment  Patient: James [name] is an 75 y.o., male MRN: [id number] DOB: 11-21-1940 Height: 5\' 11"  (180.3 cm) Weight: 90.7 kg (200 lb)  Insurance Information HMO:     PPO: yes     PCP:      IPA:      80/20:      OTHER: Medicare advantage PRIMARY: Blue Medicare    Policy#: [id number]      Subscriber: pt CM Name: Limmie Patricia      Phone#: [phone]     Fax#: [phone] Pre-Cert#: tba approved for 7 days      Employer: retired Benefits:  Phone #: [phone]     Name: 05/10/2016 Eff. Date: 07/26/15     Deduct: none      Out of Pocket Max: $5500      Life Max: none CIR: $300 copay per day days 1-6 then covers 100%      SNF: no copay days 1-20: $164.50 per day days 21-100 Outpatient: $40 copay per visit     Co-Pay: visits per medical neccessity Home Health: 100%      Co-Pay: visits per medical neccessity DME: 80%     Co-Pay: 20% Providers: in network  SECONDARY: none       Medicaid Application Date:       Case Manager:  Disability Application Date:       Case Worker:   Emergency Contact Information Jaye Beagle, wife Cell [phone]  Current Medical History  Patient Admitting Diagnosis: TBI after fall  History of Present Illness:  HPI: James [name] is a 75 year old male with 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 He was found to  have 9 mm left IPH, bilateral SDH and SAH. NS recommended conservative care with serial CT. Follow up CT head with increase in bleed. Neurologically reported to be improving but with resultant agitation, nonsensical speech and difficulty with complex tasks. He was started on rocephin for UTI.   Diet upgraded to regular thin with small bits and cues to clear residual .  Patient's medical record from [facility] has been reviewed by the rehabilitation admission coordinator and physician.  NIH Stroke scale: Glascow Coma Scale:  Past Medical History      Past Medical History:  Diagnosis Date  . Hypertension     Family History   family history is not on file.  Prior Rehab/Hospitalizations Has the patient had major surgery during 100 days prior to admission? No             AIR 2005 in [place] after initial TBI.  2012 pt admitted to [facility] after a fall with left ankle fx and distal fibula fx. Due to NWB status, pt 's wife chose to d/c to [facility]. Pt at that time was experiencing ETOH withdrawal. Pt has now neen sober for 6 years.  Current Medications Zocor Keppra Lanoxin  Pepcid Toprol XL Zoloft Flomax Haldol prn Rocephin for 5 days  Patients Current Diet; Regular diet with thin liquids.   Precautions / Restrictions Precautions Precautions: Fall Restrictions Weight Bearing Restrictions: No   Has the patient had 2 or more falls or a fall with injury in the past year?No  Prior Activity Level Limited Community (1-2x/wk): does not drive. watches alot of tv, like audible books on kindle, I pad for solitaire and jig saw puzzles. Retired Tax adviser.  Prior Functional Level Self Care: Did the patient need help bathing, dressing, using the toilet or eating?  Independent  Indoor Mobility: Did the patient need assistance with walking from room to room (with or without device)? Independent  Stairs: Did the  patient need assistance with internal or external stairs (with or without device)? Independent  Functional Cognition: Did the patient need help planning regular tasks such as shopping or remembering to take medications? Needed some help. Patient managed his own med pill box system , but wife IT sales professional / Equipment N/a  Prior Device Use: Indicate devices/aids used by the patient prior to current illness, exacerbation or injury? None of the above   Prior Functional Level Current Functional Level  Bed Mobility Independent Min assist  Transfers Independent Min assist Requires some min assist for steadying  Mobility - Walk/Wheelchair Independent Min assist (300 feet min to mod assist with RW) Ataxic, wide base of support. Impulsive with max cues for pacing. LOB noted with walking  Upper Body Dressing Independent Min assist  Lower Body Dressing Independent Mod assist  Grooming Independent Mod Independent  Eating/Drinking Independent Mod Independent  Toilet Transfer Independent Min assist  Bladder Continence  continent continent  Bowel Management continent continent  Stair Climbing Independent Not attempted  Communication some expressive aphasia since 2005 (calls his wife "grandmother" but does know her name is Clinical cytogeneticist) global aphasia  Memory intact Able to follow 1-2 step directions with extra time.   Cooking/Meal Prep wife cooks     Housework no   Money Management wife manages bills, but pt does ask appropriate questions to oversee   Driving Not since [id number]     Wife reports chronic visual issues as a result of previous injuries/CVA, reports increased difficulty with depth perception. Chronic strength deiecits in RUE. Some limitations by fatigue. Patient with difficulty navigating around objects in halls, required cues to avoid.  Special needs/care consideration BiPAP/CPAP  N/a CPM  N/a Continuous Drip IV n/a Dialysis n/a Life Vest  N/a Oxygen  N/a Special  Bed  N/a Trach Size  N/a Wound Vac (area) n/a Skin no reported areas Bowel mgmt: continent Bladder mgmt: dysuria some incontinence Diabetic mgmt  N/a  Previous Home Environment Living Arrangements: Spouse/significant other  Lives With: Spouse Available Help at Discharge: Family, Available 24 hours/day (wife avialble 24/7 except one day a week she does volunteer ) Type of Home: Independent living facility [facility]) Home Layout: One level Home Access: Level entry (level in front, 3 steps in back in their villa) Bathroom Shower/Tub: Walk-in shower (with 2 inch step up) Bathroom Toilet: Handicapped height Bathroom Accessibility: Yes How Accessible: Accessible via walker Home Care Services: No Additional Comments: grab bars with call light in bathroom  Discharge Living Setting Plans for Discharge Living Setting: Patient's home, Lives with (comment) (wife) Type of Home at Discharge: [place] Name at Discharge: [place] Discharge Home Layout: One level Discharge Home Access: Level entry (level entry  in front and 3 steps in back) Discharge Bathroom Shower/Tub: Walk-in shower (with two inch step into shower) Discharge Bathroom Toilet: Handicapped height Discharge Bathroom Accessibility: Yes How Accessible: Accessible via walker Does the patient have any problems obtaining your medications?: No  Social/Family/Support Systems Patient Roles: Spouse (no children) Contact Information: Jaye Beagle, wife Anticipated Caregiver: wife and sister in law Anticipated Caregiver's Contact Information: see above Ability/Limitations of Caregiver: wife works 4 hrs as Psychologist, occupational one day per week Caregiver Availability: 24/7 Discharge Plan Discussed with Primary Caregiver: Yes Is Caregiver In Agreement with Plan?: Yes Does Caregiver/Family have Issues with Lodging/Transportation while Pt is in Rehab?: No  Goals/Additional  Needs Patient/Family Goal for Rehab: Mod I to supervision with PT, OT, and SLP Expected length of stay: ELOS 10- 12 days Equipment Needs: bed and chair alarm due to decreased safety awareness Pt/Family Agrees to Admission and willing to participate: Yes Program Orientation Provided & Reviewed with Pt/Caregiver Including Roles  & Responsibilities: Yes  Patient Condition: I have reviewed the medical records from [facility] as well as interviewed patient's wife by phone. Pt previously Independent but retired pta. History of old TBI 2005. Acute PT, OT, and SLP assessed patient with inpatient acute rehabilitation recommendations. Patient will benefit form an inpatient acute rehabilitation admission for therapies, rehabilitation Nursing and Rehabilitative Medicine coordinated efforts to return to the community. I have reviewed and discussed findings with the Rehab MD and have approval for admission on 05/12/2106.  Preadmission Screen Completed By:  Cleatrice Burke, 05/11/2016 3:54 PM ______________________________________________________________________   Discussed status with Dr. Letta Pate on 05/12/2016 at  and received telephone approval for admission today.  Admission Coordinator:  Cleatrice Burke, time [id number] Date 05/12/2016   Assessment/Plan: Diagnosis:TBI, IPH, SDH, SAH 1. Does the need for close, 24 hr/day  Medical supervision in concert with the patient's rehab needs make it unreasonable for this patient to be served in a less intensive setting? Yes 2. Co-Morbidities requiring supervision/potential complications: Prior TBI , gait imbalance, R HP, Aphasia, ETOH abuse 3. Due to bladder management, bowel management, safety, skin/wound care, disease management, medication administration, pain management and patient education, does the patient require 24 hr/day rehab nursing? Yes 4. Does the patient require coordinated care of a physician, rehab nurse, PT (1-2  hrs/day, 5 days/week), OT (1-2 hrs/day, 5 days/week) and SLP (.5-1 hrs/day, 5 days/week) to address physical and functional deficits in the context of the above medical diagnosis(es)? Yes Addressing deficits in the following areas: balance, endurance, locomotion, strength, transferring, bowel/bladder control, bathing, dressing, feeding, grooming, toileting, cognition, speech, language, swallowing and psychosocial support 5. Can the patient actively participate in an intensive therapy program of at least 3 hrs of therapy 5 days a week? Yes 6. The potential for patient to make measurable gains while on inpatient rehab is good 7. Anticipated functional outcomes upon discharge from inpatients are: supervision PT, supervision OT, supervision SLP 8. Estimated rehab length of stay to reach the above functional goals is: 10-14d 9. Does the patient have adequate social supports to accommodate these discharge functional goals? Yes 10. Anticipated D/C setting: Home 11. Anticipated post D/C treatments: [place] therapy 12. Overall Rehab/Functional Prognosis: good    RECOMMENDATIONS: This patient's condition is appropriate for continued rehabilitative care in the following setting: CIR Patient has agreed to participate in recommended program. Yes Note that insurance prior authorization may be required for reimbursement for recommended care.  Comment:  Cleatrice Burke 05/11/2016    Revision History

## 2016-05-12 NOTE — H&P (Signed)
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 on 05/04/16 after [place]. He was found to have 9 mm left IPH, bilateral SDH and SAH. NS recommended conservative care with serial CT. Follow up CT head with increase in bleed but neurologically reported to be improving. NO surgical intervention needed per NS.   He was treated with rocephin for UTI.  His diet upgraded to regular thin with small bits and cues to clear residuals due to impulsivity.  He has had worsening of right sided weakness, increase in visual deficits and worsening of aphasia. CIR recommended for follow up therapy.     Review of Systems  Unable to perform ROS: Language  HENT: Negative for hearing loss.   Eyes: Positive for blurred vision (wife feels vision is worse now) and double vision.  Respiratory: Negative for cough and shortness of breath.   Cardiovascular: Negative for chest pain and leg swelling.  Gastrointestinal: Negative for constipation, heartburn and nausea.  Genitourinary: Negative for dysuria and urgency.  Musculoskeletal: Positive for myalgias. Negative for neck pain.  Skin: Negative for itching and rash.  Neurological: Positive for speech change (used to have language of confusion but had increase in verbal output PTA) and focal weakness (right sided weakness worse). Negative for headaches.  Psychiatric/Behavioral: Negative for depression. The patient does not have insomnia.        Has been getting confused at nights during hospitalization.           Past Medical History:  Diagnosis Date  . Alcohol abuse, in remission   . Atrial fibrillation ([place])   . Closed right ankle fracture   . 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 Gait disorder   . Hypertension   . Seizure disorder ([place])   . 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          Past Surgical History:  Procedure Laterality Date  . CRANIECTOMY FOR DEPRESSED SKULL FRACTURE  2009   with MRSA infection  . CRANIOTOMY     X 2  . HERNIA REPAIR     during childhood           Family History  Problem Relation Age of Onset  . Family history unknown: Yes      Social History:  Married. Retired middle school Education officer, museum. Per reports that he has never smoked. He has never used smokeless tobacco. Has not used any alcohol for the past 7 years. Per reports that he does not use drugs.         Allergies  Allergen Reactions  . Bactrim   . Codeine   . Tetanus Toxoids     (Not in a hospital admission)  Home: One level home with 1 STE   Functional History: Independent without AD.   Near falls due to visual deficits.   Functional Status:  Mobility: Min assist for transfers Ambulating 300' with Min/Mod assist and RW   ADL:   Cognition:       Labs:  Na- 10/16--WBC-12.7  Hgb- 12.3  Hct: 34.9    Plt:  155    There were no vitals taken for this visit. Physical Exam  Nursing note and vitals reviewed. Constitutional: He appears well-developed and well-nourished.  HENT:  Head: Normocephalic and atraumatic.  Old healed incision on scalp  Eyes: Conjunctivae and EOM are normal. Pupils are equal, round, and reactive to light. Right eye exhibits no discharge.  Neck: Normal range of motion. Neck supple.  Cardiovascular: Normal rate and regular rhythm.   Respiratory: Breath sounds normal. No stridor. No respiratory distress. He has no wheezes.  GI: Soft. Bowel sounds are normal. He exhibits no distension. There is no tenderness.  Musculoskeletal: He exhibits no edema or tenderness.   Layering ecchymosis LUE. No pain with palpation or ROM  Neurological: He is alert.  Global aphasia with apraxia. Able to repeat "Estanislado" and "Pam" --tends to nod yes to most question. Able to follow simple one step motor commands with visual/tactile cues.  Visual deficits noted with lack of peripheral vision. Right sided weakness noted.   Skin: Skin is warm and dry.  Layering ecchymosis left posterior neck, LUE, left flank and left hip.     Lab Results Last 48 Hours  No results found for this or any previous visit (from the past 48 hour(s)).   Imaging Results (Last 48 hours)  No results found.       Medical Problem List and Plan: 1.  , Right hemiparesis, aphasia, cognitive dysfunction, gait disorder secondary to left intraparenchymal hemorrhage, subarachnoid hemorrhage, subdural hemorrhage following a fall 05/04/2016 2.  DVT Prophylaxis/Anticoagulation: Pharmaceutical: Lovenox 3. Pain Management: Will change oxycodone to hydrocodone as wife reporting confusion SE.   4. Mood: Monitor for visual and behavioral changes. History of depression 5. Neuropsych: This patient is not capable of making decisions on his own behalf. 6. Skin/Wound Care: Routine pressure relief measures 7. Fluids/Electrolytes/Nutrition: Monitor I/O. Check lytes in am. Appetite reported to be good.  8. PAF?/HTN: Monitor blood pressure and heart rate bid. Continue digoxin and Toprol XL.  9. Seizure disorder: On keppra bid.  10. E coli UTI: Treated. Will recheck culture to check for efficacy of treatment.  11. Urinary retention: Monitor voiding with PVR checks. Toilet every 3-4 hours.  12. History of gout. Monitor for flareup 13. Review of primary care notes indicate has CODE STATUS:   DO NOT RESUSCITATE, this was discussed with his wife who is currently at their home, but will be back later tonight. She will bring no code order from primary care physician. Will write orders for this hospitalization as  well   Post Admission Physician Evaluation: 1. Functional deficits secondary  to  Right hemiparesis, aphasia, cognitive dysfunction, gait disorder secondary to left intraparenchymal hemorrhage, subarachnoid hemorrhage, subdural hemorrhage following a fall 05/04/2016. 2. Patient is admitted to receive collaborative, interdisciplinary care between the physiatrist, rehab nursing staff, and therapy team. 3. Patient's level of medical complexity and substantial therapy needs in context of that medical necessity cannot be provided at a lesser intensity of care such as a SNF. 4. Patient has experienced substantial functional loss from his/her baseline which was documented above under the "Functional History" and "Functional Status" headings.  Judging by the patient's diagnosis, physical exam, and functional history, the patient has potential for functional progress which will result in measurable gains while on inpatient rehab.  These gains will be of substantial and practical use upon discharge  in facilitating mobility and self-care at the household level. 5. Physiatrist will provide 24 hour management of medical needs as well as  oversight of the therapy plan/treatment and provide guidance as appropriate regarding the interaction of the two. 6. 24 hour rehab nursing will assist with bladder management, bowel management, safety, skin/wound care, disease management, medication administration, pain management and patient education  and help integrate therapy concepts, techniques,education, etc. 7. PT will assess and treat for/with: pre gait, gait training, endurance , safety, equipment, neuromuscular re education.   Goals are: Supervision 8. OT will assess and treat for/with: ADLs, Cognitive perceptual skills, Neuromuscular re education, safety, endurance, equipment.   Goals are: Supervision. Therapy may proceed with showering this patient. 9. SLP will assess and treat for/with: Aphasia, apraxia, memory,  attention, concentration, problem solving.  Goals are: Supervision. 10. Case Management and Social Worker will assess and treat for psychological issues and discharge planning. 11. Team conference will be held weekly to assess progress toward goals and to determine barriers to discharge. 12. Patient will receive at least 3 hours of therapy per day at least 5 days per week. 13. ELOS:10-14 days    14. Prognosis:  fair     Charlett Blake M.D. [facility] FAAPM&R (Sports Med, Neuromuscular Med) Diplomate [place] Board of Electrodiagnostic Med  05/12/2016

## 2016-05-12 NOTE — Progress Notes (Signed)
Patient ID: James [name], male   DOB: 22-Nov-1940, 75 y.o.   MRN: [id number] Patient arrived from [facility] via EMS. Family followed behind with patient belongings. Patient and family oriented to room, rehab unit, rehab safety plan, fall prevention plan, BI notebook, and rehab schedule. Patient has received Flu vaccination. Patient resting comfortably in bed with bed alarm on and 4 side rails up per family request. Call bell at patient side but patient unable to communicate so staff will have to go to room.

## 2016-05-13 ENCOUNTER — Inpatient Hospital Stay (HOSPITAL_COMMUNITY): Payer: Medicare HMO | Admitting: Occupational Therapy

## 2016-05-13 ENCOUNTER — Inpatient Hospital Stay (HOSPITAL_COMMUNITY): Payer: Medicare Other | Admitting: Physical Therapy

## 2016-05-13 ENCOUNTER — Inpatient Hospital Stay (HOSPITAL_COMMUNITY): Payer: Medicare Other

## 2016-05-13 ENCOUNTER — Inpatient Hospital Stay (HOSPITAL_COMMUNITY): Payer: Medicare Other | Admitting: Speech Pathology

## 2016-05-13 DIAGNOSIS — I10 Essential (primary) hypertension: Secondary | ICD-10-CM | POA: Diagnosis present

## 2016-05-13 DIAGNOSIS — R4701 Aphasia: Secondary | ICD-10-CM | POA: Insufficient documentation

## 2016-05-13 DIAGNOSIS — S065XAA Traumatic subdural hemorrhage with loss of consciousness status unknown, initial encounter: Secondary | ICD-10-CM | POA: Insufficient documentation

## 2016-05-13 DIAGNOSIS — D72820 Lymphocytosis (symptomatic): Secondary | ICD-10-CM | POA: Insufficient documentation

## 2016-05-13 DIAGNOSIS — I609 Nontraumatic subarachnoid hemorrhage, unspecified: Secondary | ICD-10-CM

## 2016-05-13 DIAGNOSIS — S098XXA Other specified injuries of head, initial encounter: Secondary | ICD-10-CM

## 2016-05-13 DIAGNOSIS — S065X9A Traumatic subdural hemorrhage with loss of consciousness of unspecified duration, initial encounter: Secondary | ICD-10-CM | POA: Insufficient documentation

## 2016-05-13 DIAGNOSIS — S065X1A Traumatic subdural hemorrhage with loss of consciousness of 30 minutes or less, initial encounter: Secondary | ICD-10-CM

## 2016-05-13 DIAGNOSIS — R339 Retention of urine, unspecified: Secondary | ICD-10-CM | POA: Diagnosis present

## 2016-05-13 DIAGNOSIS — Z8739 Personal history of other diseases of the musculoskeletal system and connective tissue: Secondary | ICD-10-CM | POA: Insufficient documentation

## 2016-05-13 LAB — CBC WITH DIFFERENTIAL/PLATELET
Basophils Absolute: 0 10*3/uL (ref 0.0–0.1)
Basophils Relative: 0 %
EOS ABS: 0.1 10*3/uL (ref 0.0–0.7)
EOS PCT: 1 %
HCT: 39.1 % (ref 39.0–52.0)
Hemoglobin: 13.3 g/dL (ref 13.0–17.0)
LYMPHS ABS: 1.6 10*3/uL (ref 0.7–4.0)
Lymphocytes Relative: 12 %
MCH: 28.3 pg (ref 26.0–34.0)
MCHC: 34 g/dL (ref 30.0–36.0)
MCV: 83.2 fL (ref 78.0–100.0)
MONO ABS: 1.5 10*3/uL — AB (ref 0.1–1.0)
MONOS PCT: 11 %
Neutro Abs: 10.2 10*3/uL — ABNORMAL HIGH (ref 1.7–7.7)
Neutrophils Relative %: 76 %
PLATELETS: 248 10*3/uL (ref 150–400)
RBC: 4.7 MIL/uL (ref 4.22–5.81)
RDW: 13 % (ref 11.5–15.5)
WBC: 13.4 10*3/uL — ABNORMAL HIGH (ref 4.0–10.5)

## 2016-05-13 LAB — URINE MICROSCOPIC-ADD ON
SQUAMOUS EPITHELIAL / LPF: NONE SEEN
Squamous Epithelial / LPF: NONE SEEN

## 2016-05-13 LAB — URINALYSIS, ROUTINE W REFLEX MICROSCOPIC
BILIRUBIN URINE: NEGATIVE
GLUCOSE, UA: NEGATIVE mg/dL
GLUCOSE, UA: NEGATIVE mg/dL
HGB URINE DIPSTICK: NEGATIVE
HGB URINE DIPSTICK: NEGATIVE
KETONES UR: NEGATIVE mg/dL
Ketones, ur: NEGATIVE mg/dL
LEUKOCYTES UA: NEGATIVE
Leukocytes, UA: NEGATIVE
Nitrite: NEGATIVE
Nitrite: NEGATIVE
PH: 6.5 (ref 5.0–8.0)
Protein, ur: 30 mg/dL — AB
Protein, ur: 30 mg/dL — AB
SPECIFIC GRAVITY, URINE: 1.018 (ref 1.005–1.030)
Specific Gravity, Urine: 1.015 (ref 1.005–1.030)
pH: 6.5 (ref 5.0–8.0)

## 2016-05-13 LAB — COMPREHENSIVE METABOLIC PANEL
ALT: 147 U/L — ABNORMAL HIGH (ref 17–63)
AST: 113 U/L — AB (ref 15–41)
Albumin: 2.9 g/dL — ABNORMAL LOW (ref 3.5–5.0)
Alkaline Phosphatase: 232 U/L — ABNORMAL HIGH (ref 38–126)
Anion gap: 10 (ref 5–15)
BILIRUBIN TOTAL: 1.1 mg/dL (ref 0.3–1.2)
BUN: 13 mg/dL (ref 6–20)
CO2: 27 mmol/L (ref 22–32)
CREATININE: 0.78 mg/dL (ref 0.61–1.24)
Calcium: 8.6 mg/dL — ABNORMAL LOW (ref 8.9–10.3)
Chloride: 94 mmol/L — ABNORMAL LOW (ref 101–111)
GFR calc Af Amer: >60 mL/min (ref >60)
Glucose, Bld: 143 mg/dL — ABNORMAL HIGH (ref 65–99)
POTASSIUM: 3.4 mmol/L — AB (ref 3.5–5.1)
Sodium: 131 mmol/L — ABNORMAL LOW (ref 135–145)
TOTAL PROTEIN: 7.8 g/dL (ref 6.5–8.1)

## 2016-05-13 MED ORDER — POTASSIUM CHLORIDE CRYS ER 20 MEQ PO TBCR
20.0000 meq | EXTENDED_RELEASE_TABLET | Freq: Two times a day (BID) | ORAL | Status: AC
  Administered 2016-05-13 – 2016-05-14 (×3): 20 meq via ORAL
  Filled 2016-05-13 (×3): qty 1

## 2016-05-13 MED ORDER — SORBITOL 70 % SOLN
30.0000 mL | Freq: Every day | Status: DC | PRN
  Administered 2016-05-13 – 2016-05-19 (×3): 30 mL via ORAL
  Filled 2016-05-13 (×4): qty 30

## 2016-05-13 NOTE — Care Management Note (Signed)
[facility] Individual Statement of Services  Patient Name:  James [name]  Date:  05/13/2016  Welcome to the [facility].  Our goal is to provide you with an individualized program based on your diagnosis and situation, designed to meet your specific needs.  With this comprehensive rehabilitation program, you will be expected to participate in at least 3 hours of rehabilitation therapies Monday-Friday, with modified therapy programming on the weekends.  Your rehabilitation program will include the following services:  Physical Therapy (PT), Occupational Therapy (OT), Speech Therapy (ST), 24 hour per day rehabilitation nursing, Therapeutic Recreaction (TR), Neuropsychology, Case Management (Social Worker), Rehabilitation Medicine, Nutrition Services and Pharmacy Services  Weekly team conferences will be held on Wednesday to discuss your progress.  Your Social Worker will talk with you frequently to get your input and to update you on team discussions.  Team conferences with you and your family in attendance may also be held.  Expected length of stay: 14-17 days Overall anticipated outcome: min level of assist  Depending on your progress and recovery, your program may change. Your Social Worker will coordinate services and will keep you informed of any changes. Your Social Worker's name and contact numbers are listed  below.  The following services may also be recommended but are not provided by the [facility]:    [place] will be made to provide these services after discharge if needed.  Arrangements include referral to agencies that provide these services.  Your insurance has been verified to be:  [employer] Your primary doctor is:  Ramonita Lab  Pertinent information will be shared with your doctor and your insurance company.  Social Worker:   Ovidio Kin, [place] or ([phone]  Information discussed with and copy given to patient by: Elease Hashimoto, 05/13/2016, 11:05 AM

## 2016-05-13 NOTE — Evaluation (Addendum)
Occupational Therapy Assessment and Plan  Patient Details  Name: James [name] MRN: [id number] Date of Birth: 02-Feb-1941  OT Diagnosis: apraxia, ataxia, cognitive deficits, disturbance of vision and hemiplegia affecting dominant side Rehab Potential: Rehab Potential (ACUTE ONLY): Fair ELOS: 14-17 days   Today's Date: 05/13/2016  Session 1 OT Individual Time: 1000-1100 OT Individual Time Calculation (min): 60 min   Session 2 OT Individual Time: 1430-1500 OT Individual Time Calculation (min): 30 min      Problem List:  Patient Active Problem List   Diagnosis Date Noted  . 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 Benign essential HTN   . Urinary retention   . History of gout   . Global aphasia   . SAH (subarachnoid hemorrhage) ([place])   . SDH (subdural hematoma) ([place])   . Lymphocytosis   . Subdural hemorrhage following injury ([place]) 05/12/2016    Past Medical History:  Past Medical History:  Diagnosis Date  . Alcohol abuse, in remission   . Atrial fibrillation ([place])   . Bilateral renal masses   . Closed right ankle fracture   . 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 Gait disorder   . Gout   . Hypertension   . Seizure disorder ([place])   . TBI (traumatic brain injury) ([place]) 2005   with residual  right sided weakness, aphasia and loss of peripheral vision. 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    Past Surgical History:  Past Surgical History:  Procedure Laterality Date  . CRANIECTOMY FOR DEPRESSED SKULL FRACTURE  2009   with MRSA infection  . CRANIOTOMY     X 2  . HERNIA REPAIR     during childhood  . ORIF ANKLE FRACTURE Left 2012    Assessment & Plan Clinical Impression: Patient is a 75 y.o. year old male admitted to [facility] on 05/04/16 after fall down [place]. He was found to have 9 mm left IPH, bilateral SDH and SAH. NS recommended conservative care with serial CT. Follow up CT head with increase in bleed but  neurologically reported to be improving. NO surgical intervention needed per NS.   He was treated with rocephin for UTI.  His diet upgraded to regular thin with small bits and cues to clear residuals due to impulsivity.  He has had worsening of right sided weakness, increase in visual deficits and worsening of aphasia. Patient transferred to CIR on 05/12/2016 .    Patient currently requires total with basic self-care skills secondary to muscle weakness, impaired timing and sequencing, motor apraxia, ataxia, decreased coordination and decreased motor planning, decreased visual acuity and decreased visual perceptual skills, decreased attention to right and ideational apraxia, decreased initiation, decreased attention, decreased awareness, decreased problem solving, decreased safety awareness, decreased memory and delayed processing and decreased sitting balance, decreased standing balance, decreased postural control and hemiplegia.  Prior to hospitalization, patient could complete BADL with independent .  Patient will benefit from skilled intervention to decrease level of assist with basic self-care skills prior to discharge home with care partner.  Anticipate patient will require minimal physical assistance and follow up home health.  OT - End of Session Endurance Deficit: Yes Endurance Deficit Description: patient fatigued easily, required seated/supine rest breaks with mobility OT Assessment Rehab Potential (ACUTE ONLY): Fair OT Patient demonstrates impairments in the following area(s): Balance;Behavior;Cognition;Endurance;Motor;Perception;Safety;Vision OT Basic ADL's Functional Problem(s): Eating;Grooming;Bathing;Dressing;Toileting OT Transfers Functional Problem(s): Toilet;Tub/Shower OT Additional Impairment(s): Fuctional Use of Upper  Extremity OT Plan OT Intensity: Minimum of 1-2 x/day, 45 to 90 minutes OT Frequency: 5 out of 7 days OT Duration/Estimated Length of Stay: 14-17 days OT  Treatment/Interventions: Balance/vestibular training;Cognitive remediation/compensation;Community reintegration;Discharge planning;DME/adaptive equipment instruction;Functional mobility training;Neuromuscular re-education;Patient/family education;Self Care/advanced ADL retraining;Therapeutic Activities;Therapeutic Exercise;UE/LE Strength taining/ROM;UE/LE Coordination activities;Visual/perceptual remediation/compensation;Wheelchair propulsion/positioning OT Self Feeding Anticipated Outcome(s): Min A OT Basic Self-Care Anticipated Outcome(s): Min A OT Toileting Anticipated Outcome(s): Min A OT Bathroom Transfers Anticipated Outcome(s): Min A OT Recommendation Patient destination: Home Follow Up Recommendations: Home health OT Equipment Recommended: To be determined   Skilled Therapeutic Intervention Session 1 Initial eval completed with treatment provided to address functional transfers, attention to task, problem solving during ADLs,  Sit<>stands, and adapted bathing/dressing skills. Pt globally aphasic, but able to follow 1-step commands with Mod verbal and tactile cues. Difficulty attending to R side w/ vision impairments needing to be further tested. Pt required Max A for stand-pivot transfers to toilet and tub/shower bench. Pt utulized objects appropriately during 50% of self-care tasks. Pt w/ difficulty sequencing tasks, requiring Max cues. Pt w/ ideational apraxia. While completing grooming at the sink, pt swallowed toothpaste despite max verbal and tactile cues.  Pt then attempted to eat the hand soap. Pt left seated in w/c at end of session with safety belt in place and needs met.    Session 2 2nd 1:1 OT session focused on attention and vision assessment. Pt sleeping upon OT arrival, requiring tactile cues and environmental changes to maintain alert state. Pt transferred to w/c with Max A squat-pivot.  Increased head turn to scan to locate objects, squints R eye to locate objects. Difficulty  assessing vision 2/2 aphasia. Utilized patch to cover R eye, then L eye. Pt inconsistent with locating items with each eye covered; vision to be further assessed. Pt returned to bed at end of session and left with rails up and bed alarm on.    OT Evaluation Precautions/Restrictions  Precautions Precautions: Fall Precaution Comments: residual deficits from 2005 TBI/CVA, impaired vision, R sided weakness Restrictions Weight Bearing Restrictions: No Pain Pain Assessment Pain Assessment: Faces Faces Pain Scale: No hurt Home Living/Prior Functioning Home Living Family/patient expects to be discharged to:: Private residence Living Arrangements: Spouse/significant other Available Help at Discharge: Family, Available 24 hours/day (wife avialble 24/7 except one day a week she does volunteer ) Type of Home: Independent living facility [facility] ILF) Home Access: Level entry Home Layout: One level Bathroom Shower/Tub: Walk-in shower (with 2 inch step up) Bathroom Toilet: Handicapped height Bathroom Accessibility: Yes Additional Comments: grab bars with call light in bathroom  Lives With: Spouse Prior Function Level of Independence: Independent with transfers, Independent with gait Driving: No Comments: h/o TBI 2005 ADL ADL ADL Comments: See functional navigator Vision/Perception  Vision- History Patient Visual Report: Peripheral vision impairment (history) Vision- Assessment Vision Assessment?: Vision impaired- to be further tested in functional context Additional Comments: Possible blurry vision  Cognition Overall Cognitive Status: History of cognitive impairments - at baseline (Impaired from baseline ) Orientation Level: Nonverbal/unable to assess (global aphasia) Memory:  (unable to assess) Attention: Sustained Sustained Attention: Impaired Awareness: Impaired Problem Solving: Impaired Problem Solving Impairment: Functional basic Executive Function:  (all likely limited by  underlying impairment) Safety/Judgment: Impaired Sensation Sensation Additional Comments: 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 Coordination Gross Motor Movements are Fluid and Coordinated: No Fine Motor Movements are Fluid and Coordinated: No Coordination and Movement Description: R sided weakness Motor  Motor Motor: Hemiplegia;Abnormal postural alignment and control Mobility  Sit  to Stand: 2: Max assist;With upper extremity assist Stand to Sit: 2: Max assist;With upper extremity assist  Balance Balance Balance Assessed: Yes Static Sitting Balance Static Sitting - Balance Support: Left upper extremity supported;Feet supported Static Sitting - Level of Assistance: 5: Stand by assistance;4: Min assist Static Standing Balance Static Standing - Balance Support: Right upper extremity supported;During functional activity Static Standing - Level of Assistance: 2: Max assist Extremity/Trunk Assessment RUE Assessment RUE Assessment: Exceptions to [facility] RUE Strength RUE Overall Strength Comments: Overall 3-/5  LUE Assessment LUE Assessment: Within Functional Limits   See Function Navigator for Current Functional Status.   Refer to Care Plan for Long Term Goals  Recommendations for other services: None  Discharge Criteria: Patient will be discharged from OT if patient refuses treatment 3 consecutive times without medical reason, if treatment goals not met, if there is a change in medical status, if patient makes no progress towards goals or if patient is discharged from hospital.  The above assessment, treatment plan, treatment alternatives and goals were discussed and mutually agreed upon: No family available/patient unable  Valma Cava 05/13/2016, 4:14 PM

## 2016-05-13 NOTE — Evaluation (Signed)
Speech Language Pathology Assessment and Plan  Patient Details  Name: James [name] MRN: [id number] Date of Birth: [birth year]  SLP Diagnosis: Aphasia;Cognitive Impairments;Dysphagia  Rehab Potential: Fair ELOS:   14-17 days   Today's Date: 05/13/2016 SLP Individual Time: 2706-2376 SLP Individual Time Calculation (min): 57 min    Problem List:  Patient Active Problem List   Diagnosis Date Noted  . 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 Benign essential HTN   . Urinary retention   . History of gout   . Global aphasia   . Subdural hemorrhage following injury ([place]) 05/12/2016   Past Medical History:  Past Medical History:  Diagnosis Date  . Alcohol abuse, in remission   . Atrial fibrillation ([place])   . Bilateral renal masses   . Closed right ankle fracture   . 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 Gait disorder   . Gout   . Hypertension   . Seizure disorder ([place])   . 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    Past Surgical History:  Past Surgical History:  Procedure Laterality Date  . CRANIECTOMY FOR DEPRESSED SKULL FRACTURE  2009   with MRSA infection  . CRANIOTOMY     X 2  . HERNIA REPAIR     during childhood  . ORIF ANKLE FRACTURE Left 2012    Assessment / Plan / Recommendation Clinical Impression   James [name] is a 75 year old male with history of TBI with SDH s/p crani 2005 and 2009 with residual aphasia, visual deficits and right hemiparesis, ETOH abuse, gout, seizure disorder who was admitted to [facility] on 05/04/16 after [place]. He was found to have 9 mm left IPH, bilateral SDH and SAH. NS recommended conservative care with serial CT. Follow up CT head with increase in bleed but neurologically reported to be improving. NO surgical intervention needed per NS. He was treated with  rocephin for UTI. His diet upgraded to regular thin with small bits and cues to clear residuals due to impulsivity. He has had worsening of right sided weakness, increase in visual deficits and worsening of aphasia.  Decline noted this morning during evaluation period. Pt presenting with s/s aspiration and oral dysphagia. Recommend: MBSS. Global aphasia noted. Await discussion with spouse re: baseline.  Skilled Therapeutic Interventions          Pt demonstrated significant oral holding and anterior spillage with thins. Prolonged mastication and oral residue with solids. Throat clearing noted across multiple consistencies. Pt responded "yes" to all Y/N questions.   SLP Assessment  Patient will need skilled Speech Lanaguage Pathology Services during CIR admission    Recommendations  SLP Diet Recommendations:  (to be determined after MBSS) Medication Administration: Crushed with puree Postural Changes and/or Swallow Maneuvers: Seated upright 90 degrees;Upright 30-60 min after meal Oral Care Recommendations: Oral care BID Follow up Recommendations: Home Health SLP;24 hour supervision/assistance (to be determined)    SLP Frequency 3 to 5 out of 7 days   SLP Duration  SLP Intensity  SLP Treatment/Interventions  14-17 days  Minumum of 1-2 x/day, 30 to 90 minutes  Cognitive remediation/compensation;Dysphagia/aspiration precaution training;Speech/Language facilitation;Cueing hierarchy;Patient/family education;Functional tasks;Multimodal communication approach    Pain Pain Assessment Pain Assessment: Faces Faces Pain Scale: No hurt  Prior Functioning Type of Home: Independent living facility  Lives With: Spouse Available Help at Discharge: Family;Available 24 hours/day  Function:  Eating Eating   Modified Consistency Diet: Yes Eating Assist Level: Helper scoops food on utensil;Helper brings food to mouth;Helper checks for pocketed food;More than reasonable amount of time;Supervision  or verbal cues;Set up assist for;Help with picking up utensils     Helper Scoops Food on Utensil: Every scoop Helper Brings Food to Mouth: Every scoop   Cognition Comprehension Comprehension assist level: Understands basic less than 25% of the time/ requires cueing >75% of the time  Expression   Expression assist level: Expresses basis less than 25% of the time/requires cueing >75% of the time.  Social Interaction Social Interaction assist level: Interacts appropriately 25 - 49% of time - Needs frequent redirection.  Problem Solving Problem solving assist level: Solves basic less than 25% of the time - needs direction nearly all the time or does not effectively solve problems and may need a restraint for safety  Memory Memory assist level: Recognizes or recalls less than 25% of the time/requires cueing greater than 75% of the time   Short Term Goals: Week 1: SLP Short Term Goal 1 (Week 1): Pt to identify objects in a field of 2 with min A.  SLP Short Term Goal 2 (Week 1): Pt communicate wants/needs via any means with max A SLP Short Term Goal 3 (Week 1): Pt answer  basic Y/N questions with 70% acc and mod A  * dysphagia goals to be added following MBSS  Refer to Care Plan for Long Term Goals  Recommendations for other services: None  Discharge Criteria: Patient will be discharged from SLP if patient refuses treatment 3 consecutive times without medical reason, if treatment goals not met, if there is a change in medical status, if patient makes no progress towards goals or if patient is discharged from hospital.  The above assessment, treatment plan, treatment alternatives and goals were discussed and mutually agreed upon: No family available/patient unable  Vinetta Bergamo MA, [place] 05/13/2016, 11:45 AM

## 2016-05-13 NOTE — Progress Notes (Signed)
Patient information reviewed and entered into eRehab system by Aften Lipsey, RN, CRRN, PPS Coordinator.  Information including medical coding and functional independence measure will be reviewed and updated through discharge.     Per nursing patient was given "Data Collection Information Summary for Patients in Inpatient Rehabilitation Facilities with attached "Privacy Act Statement-Health Care Records" upon admission.  

## 2016-05-13 NOTE — Evaluation (Addendum)
Physical Therapy Assessment and Plan  Patient Details  Name: James [name] MRN: [id number] Date of Birth: Oct 31, 1940  PT Diagnosis: Abnormal posture, Abnormality of gait, Cognitive deficits, Coordination disorder, Difficulty walking, Hemiplegia dominant, Hypotonia, Impaired sensation and Muscle weakness Rehab Potential: Fair ELOS: 14-17 days   Today's Date: 05/13/2016 PT Individual Time: 1100-1209 and 2130-8657 PT Individual Time Calculation (min): 69 min and 50 min     Problem List: Patient Active Problem List   Diagnosis Date Noted  . 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 Benign essential HTN   . Urinary retention   . History of gout   . Global aphasia   . Subdural hemorrhage following injury ([place]) 05/12/2016    Past Medical History:  Past Medical History:  Diagnosis Date  . Alcohol abuse, in remission   . Atrial fibrillation ([place])   . Bilateral renal masses   . Closed right ankle fracture   . 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 Gait disorder   . Gout   . Hypertension   . Seizure disorder ([place])   . TBI (traumatic brain injury) ([place]) 2005   with residual  right sided weakness, aphasia and loss of peripheral vision. 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    Past Surgical History:  Past Surgical History:  Procedure Laterality Date  . CRANIECTOMY FOR DEPRESSED SKULL FRACTURE  2009   with MRSA infection  . CRANIOTOMY     X 2  . HERNIA REPAIR     during childhood  . ORIF ANKLE FRACTURE Left 2012    Assessment & Plan Clinical Impression: James [name] is a 75 year old male with history of TBI with SDH s/p crani 2005 and 2009 with residual aphasia, visual deficits and right hemiparesis, ETOH abuse, gout, seizure disorder who was admitted to [facility] on 05/04/16 after [place]. He was found to have 9 mm left IPH, bilateral SDH and SAH. NS recommended conservative care with serial CT. Follow up CT head with  increase in bleed but neurologically reported to be improving. NO surgical intervention needed per NS. He was treated with rocephin for UTI. His diet upgraded to regular thin with small bits and cues to clear residuals due to impulsivity. He has had worsening of right sided weakness, increase in visual deficits and worsening of aphasia. CIR recommended for follow up therapy.  Patient transferred to CIR on 05/12/2016.   Patient currently requires max with mobility secondary to muscle weakness, muscle joint tightness and muscle paralysis, decreased cardiorespiratoy endurance, impaired timing and sequencing, abnormal tone, unbalanced muscle activation, motor apraxia, decreased coordination and decreased motor planning, decreased visual acuity and decreased visual perceptual skills, decreased attention, decreased awareness, decreased problem solving, decreased safety awareness and decreased memory and decreased sitting balance, decreased standing balance, decreased postural control, hemiplegia and decreased balance strategies.  Prior to hospitalization, patient was independent  with mobility and lived with (P) Spouse in a (P) Independent living facility ([facility]) home.  Home access is  (P) Level entry.  Patient will benefit from skilled PT intervention to maximize safe functional mobility, minimize fall risk and decrease caregiver burden for planned discharge home with 24 hour assist.  Anticipate patient will benefit from follow up [facility] at discharge.  PT - End of Session Activity Tolerance: Tolerates < 10 min activity, no significant change in vital signs Endurance Deficit: Yes Endurance Deficit Description: patient fatigued easily, required seated/supine  rest breaks with mobility PT Assessment Rehab Potential (ACUTE/IP ONLY): Fair Barriers to Discharge: Decreased caregiver support PT Patient demonstrates impairments in the following area(s):  Balance;Behavior;Edema;Endurance;Motor;Nutrition;Pain;Perception;Safety;Sensory PT Transfers Functional Problem(s): Bed Mobility;Bed to Chair;Car;Furniture PT Locomotion Functional Problem(s): Stairs;Wheelchair Mobility;Ambulation PT Plan PT Intensity: Minimum of 1-2 x/day ,45 to 90 minutes PT Frequency: 5 out of 7 days PT Duration Estimated Length of Stay: 14-17 days PT Treatment/Interventions: Ambulation/gait training;Balance/vestibular training;Cognitive remediation/compensation;Community reintegration;Discharge planning;Disease management/prevention;DME/adaptive equipment instruction;Functional electrical stimulation;Functional mobility training;Neuromuscular re-education;Pain management;Patient/family education;Psychosocial support;Splinting/orthotics;Stair training;Therapeutic Activities;Therapeutic Exercise;UE/LE Strength taining/ROM;UE/LE Coordination activities;Visual/perceptual remediation/compensation;Wheelchair propulsion/positioning PT Transfers Anticipated Outcome(s): min A PT Locomotion Anticipated Outcome(s): min A short distance household PT Recommendation Follow Up Recommendations: Home health PT;24 hour supervision/assistance Patient destination: Home (ILF with wife) Equipment Recommended: To be determined  Skilled Therapeutic Intervention Treatment 1: Skilled therapeutic intervention initiated after completion of evaluation. Discussed with patient and wife falls risk, safety within room, and focus of therapy during stay. Discussed possible length of stay, goals, and follow-up therapy. Patient requires max A for transfers, max A x 2 for ambulation up to 20 ft, max A for 4 (6") stairs using L rail, min A for sit > supine and mod A for supine > sit on mat table. Patient followed simple functional one step commands consistently throughout session. Switched wheelchair to 20 x 18 wheelchair for improved sitting tolerance and positioning for OOB. Patient returned to bed at end of session  in preparation for MBS. Patient's wife present in room at end of session and provided information regarding patient's PLOF and history. Patient with worsening R sided weakness and difficulty swallowing since admission per chart and information from wife, PA-C Dan notified. Patient left semi reclined in bed with 4 rails up per family request, bed alarm on, and wife at bedside.  Treatment 2: Patient asleep upon arrival, aroused with mod verbal cues. Patient instructed in supine > sit using rail with mod A overall to bring RLE off bed and elevate trunk and performed stand pivot transfer to wheelchair with max A. Patient brushed hair using LUE with setup assist. Attempted wheelchair propulsion using LUE only x 15 ft with max-total A despite verbal/tactile cues for propulsion. Trial sit <> stand via bariatric Stedy to assess safe transfers with nursing with patient able to complete transfer with min A overall with BUE support on bar. Performed stand pivot transfer wheelchair <> simulated car at sedan height with patient stepping into car with LLE despite cues and demonstration to sit first then bring BLE into car with max A overall. Gait training using RW x 30 ft including 2 turns with max A, assist to fully advance RLE (patient able to stabilize independently) and control RW as patient pushing RW too far in front and max cues for sequencing and technique. Returned to room, performed stand pivot to bed with max A, and transferred sit > supine with mod A. Patient left semi reclined in bed with 4 rails up per family request, bed alarm on, and RN notified of need for soft call bell due to aphasia.  PT Evaluation Precautions/Restrictions Precautions Precautions: (P) Fall Precaution Comments: (P) residual deficits from 2005 TBI/CVA, impaired vision, R sided weakness Restrictions Weight Bearing Restrictions: (P) No General Chart Reviewed: Yes Family/Caregiver Present: Yes (wife at end of session)  Pain Pain  Assessment Pain Assessment: Faces Faces Pain Scale: No hurt Home Living/Prior Functioning Home Living Available Help at Discharge: (P) Family;Available 24 hours/day (wife avialble 24/7 except one day a week she does volunteer )  Type of Home: (P) Independent living facility [facility] ILF) Home Access: (P) Level entry Home Layout: (P) One level Bathroom Shower/Tub: (P) Walk-in shower (with 2 inch step up) Bathroom Toilet: (P) Handicapped height Bathroom Accessibility: (P) Yes Additional Comments: (P) grab bars with call light in bathroom  Lives With: (P) Spouse Prior Function Level of Independence: (P) Independent with transfers;Independent with gait Driving: (P) No Comments: (P) h/o TBI 2005 Vision/Perception  Vision - Assessment Additional Comments: (P) Possible blurry vision  Cognition Overall Cognitive Status: (P) History of cognitive impairments - at baseline (Impaired from baseline ) Orientation Level: Other (comment) (Difficult to assess due to aphasia) Attention: (P) Sustained Sustained Attention: (P) Impaired Memory: (P)  (unable to assess) Awareness: (P) Impaired Problem Solving: (P) Impaired Problem Solving Impairment: (P) Functional basic Executive Function:  (all likely limited by underlying impairment) Safety/Judgment: (P) Impaired Sensation Sensation Additional Comments: (P) 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 Coordination Gross Motor Movements are Fluid and Coordinated: (P) No Fine Motor Movements are Fluid and Coordinated: (P) No Coordination and Movement Description: (P) R sided weakness Motor  Motor Motor: (P) Hemiplegia;Abnormal postural alignment and control  Mobility Bed Mobility Bed Mobility: Supine to Sit;Sit to Supine Supine to Sit: 3: Mod assist;HOB flat Sit to Supine: 4: Min assist;HOB flat Transfers Transfers: (P) Yes Sit to Stand: (P) 2: Max assist;With upper extremity assist Stand to Sit: (P) 2: Max assist;With upper extremity assist Squat  Pivot Transfers: (P) 2: Max assist;With upper extremity assistance Locomotion  Ambulation Ambulation: Yes Ambulation/Gait Assistance: 1: +2 Total assist Ambulation Distance (Feet): 20 Feet Assistive device: 2 person hand held assist (RUE around therapist) Gait Gait: Yes Gait Pattern: Impaired Gait Pattern: Step-to pattern;Step-through pattern;Decreased stance time - right;Decreased step length - left;Decreased weight shift to left;Right flexed knee in stance;Lateral trunk lean to left;Decreased trunk rotation;Trunk flexed;Poor foot clearance - right Gait velocity: decreased Stairs / Additional Locomotion Stairs: Yes Stairs Assistance: 2: Max assist Stair Management Technique: One rail Left;Step to pattern;Forwards Number of Stairs: 4 Height of Stairs: 6 Wheelchair Mobility Wheelchair Mobility: No  Trunk/Postural Assessment  Cervical Assessment Cervical Assessment: Exceptions to [facility] (forward head) Thoracic Assessment Thoracic Assessment: Exceptions to [facility] (rounded shoulders/kyphotic) Lumbar Assessment Lumbar Assessment: Exceptions to [facility] (posterior pelvic tilt) Postural Control Postural Control: Deficits on evaluation Protective Responses: impaired/insufficient  Balance Balance Balance Assessed: Yes Static Sitting Balance Static Sitting - Balance Support: Left upper extremity supported;Feet supported Static Sitting - Level of Assistance: 5: Stand by assistance;4: Min assist Static Standing Balance Static Standing - Balance Support: (P) Right upper extremity supported;During functional activity Static Standing - Level of Assistance: (P) 2: Max assist Extremity Assessment  RUE Assessment RUE Assessment: (P) Exceptions to [facility] LUE Assessment LUE Assessment: (P) Within Functional Limits RLE Assessment RLE Assessment: Exceptions to [facility] RLE Strength RLE Overall Strength: Deficits RLE Overall Strength Comments: 3+ to 4/5 throughout LLE Assessment LLE Assessment: Within  Functional Limits   See Function Navigator for Current Functional Status.   Refer to Care Plan for Long Term Goals  Recommendations for other services: None  Discharge Criteria: Patient will be discharged from PT if patient refuses treatment 3 consecutive times without medical reason, if treatment goals not met, if there is a change in medical status, if patient makes no progress towards goals or if patient is discharged from hospital.  The above assessment, treatment plan, treatment alternatives and goals were discussed and mutually agreed upon: by family  Laretta Alstrom 05/13/2016, 12:54 PM

## 2016-05-13 NOTE — Progress Notes (Signed)
Social Work Assessment and Plan Social Work Assessment and Plan  Patient Details  Name: James [name] MRN: [id number] Date of Birth: 06-Feb-1941  Today's Date: 05/13/2016  Problem List:  Patient Active Problem List   Diagnosis Date Noted  . 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 Benign essential HTN   . Urinary retention   . History of gout   . Global aphasia   . Subdural hemorrhage following injury ([place]) 05/12/2016   Past Medical History:  Past Medical History:  Diagnosis Date  . Alcohol abuse, in remission   . Atrial fibrillation ([place])   . Bilateral renal masses   . Closed right ankle fracture   . 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 Gait disorder   . Gout   . Hypertension   . Seizure disorder ([place])   . 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    Past Surgical History:  Past Surgical History:  Procedure Laterality Date  . CRANIECTOMY FOR DEPRESSED SKULL FRACTURE  2009   with MRSA infection  . CRANIOTOMY     X 2  . HERNIA REPAIR     during childhood  . ORIF ANKLE FRACTURE Left 2012   Social History:  reports that he has never smoked. He has never used smokeless tobacco. He reports that he does not drink alcohol or use drugs.  Family / Support Systems Marital Status: Married Patient Roles: Spouse Spouse/Significant Other: [name] [phone] Children: no children Other Supports: Peggys'sister-Sharon Anticipated Caregiver: Wife and sister in-law Ability/Limitations of Caregiver: Wife does volunteer 4 hr one day a week, she is also exhausted from taking care fo pt.  Caregiver Availability: 24/7 (See if wife can provide the care at the level he reaches here) Family Dynamics: Close knit with wife and wife's family. They have friends and neighbors who are supportive, but mainly falls upon his wife to do the care and  she has been doing it for the last 12 years.  Social History Preferred language: English Religion: Presbyterian Cultural Background: No issues Education: Secretary/administrator educated Read: Yes Write: Yes Employment Status: Retired Date Retired/Disabled/Unemployed: retired Nutritional therapist Issues: No issues Guardian/Conservator: Wife is his POA and he also has HCPOA and Living Will-made copies for soft chart. MD feels pt is not currently capable of making his own decisions at this time, will look toward his wife if any need to be made. He is a DNR here   Abuse/Neglect Physical Abuse: Denies Verbal Abuse: Denies Sexual Abuse: Denies Exploitation of patient/patient's resources: Denies Self-Neglect: Denies  Emotional Status Pt's affect, behavior adn adjustment status: Pt is not able to participate at thie time due to speech deficits, have gotten information from his wife. She reports he was independent and could be left alone prior to this fall. he had residual weakness, visual issues and speech issues after his crani 2005 & 2009. She feels his vision is worse now. Recent Psychosocial Issues: other health issues and deficits from previous craniotomies 2005 & 2009 and previous stroke Pyschiatric History: History of depression currently taking zoloft, unable to participate in depression screen but once appropriate will have neuro-psych see for coping due to has been through much. Wife agrees with this. She also needs the support. Substance Abuse History: History of ETOH-sober 6 years now, no other issues  Patient / Family Perceptions,  Expectations & Goals Pt/Family understanding of illness & functional limitations: Wife is able to explain his deficits and the treatment plan. She does talk wiht the MD's and feels her questions and concerns are being addressed. She is very familiar with the healthcare system having been involved in it for numerous years with  husband. Premorbid pt/family roles/activities: Husband, Friend, church member, brother in-law, neighbor, etc Anticipated changes in roles/activities/participation: resume Pt/family expectations/goals: Wife states: " I hope he does well, I have been a caregiver for 12 years and am tired."  She realizes he will need care but needs a break right now, told to take all the time she needs, our staff will take good care of him.  Community Resources [employer]: Other (Comment) ([place]-independent living) Premorbid Home Care/DME Agencies: Other (Comment) (had in past) Transportation available at discharge: wife Resource referrals recommended: Neuropsychology, Support group (specify)  Discharge Planning Living Arrangements: Spouse/significant other Support Systems: Spouse/significant other, Other relatives, Friends/neighbors, Social worker community Type of Residence: Private residence Insurance Resources: (S) Multimedia programmer (specify) Forensic psychologist Medicare) Financial Resources: Radio broadcast assistant Screen Referred: No Living Expenses: Other (Comment) (I living within senior community) Money Management: Spouse Does the patient have any problems obtaining your medications?: No Home Management: Wife Patient/Family Preliminary Plans: Plans to return to independent living apartment with wife providing his care, if this is feasible. It will depend upon his progress and his needs, he would have the option to go to the rehab unit if needed at [facility]. Wife wants to see how he does here. Social Work Anticipated Follow Up Needs: HH/OP, Support Group, SNF  Clinical Impression Pt seems exhausted from trip from [facility] yesterday, team reports his function has declined and Dan-PA aware of this. Wife agrees with this also. She plans to take a little break while pt is here, she will come in when needed, but does not plan to stay all day and night. She was doing this at the other hospital  and is exhausted. Wife is hopeful he will do well and she will be able to mange him at home, but does the option to have him go to the rehab part of [facility] if needed. She has been providing his care for the last 12 years and is committed to him. Will await team's evaluations and work on a safe discharge plan. Pt would benefit from neuro-psych when appropriate.   Elease Hashimoto 05/13/2016, 1:26 PM

## 2016-05-13 NOTE — Progress Notes (Signed)
[facility] PHYSICAL MEDICINE & REHABILITATION     PROGRESS NOTE  Subjective/Complaints:  Pt laying in bed this AM.  He appears pleasant and does not appear to have any complaints.    ROS: Unable to assess due to language.  Objective: Vital Signs: Blood pressure (!) 148/73, pulse 76, temperature 98 F (36.7 C), temperature source Oral, resp. rate 16, height 5\' 11"  (1.803 m), weight 95.8 kg (211 lb 4.8 oz), SpO2 98 %. No results found. No results for input(s): WBC, HGB, HCT, PLT in the last 72 hours. No results for input(s): NA, K, CL, GLUCOSE, BUN, CREATININE, CALCIUM in the last 72 hours.  Invalid input(s): CO CBG (last 3)  No results for input(s): GLUCAP in the last 72 hours.  Wt Readings from Last 3 Encounters:  05/12/16 95.8 kg (211 lb 4.8 oz)  05/11/16 90.7 kg (200 lb)    Physical Exam:  BP (!) 148/73 (BP Location: Left Arm)   Pulse 76   Temp 98 F (36.7 C) (Oral)   Resp 16   Ht 5\' 11"  (1.803 m)   Wt 95.8 kg (211 lb 4.8 oz)   SpO2 98%   BMI 29.47 kg/m  Constitutional: He appears well-developedand well-nourished. NAD.  Head: Old healed incision on scalp Eyes: Conjunctivaeand EOMare normal.  Cardiovascular: Normal rateand regular rhythm.  Respiratory: Breath sounds normal. No stridor. No respiratory distress. He has no wheezes.  GI: Soft. Bowel sounds are normal. He exhibits no distension. There is no tenderness.  Musculoskeletal: He exhibits no edemaor tenderness.  Ecchymosis LUE.  Neurological: He is alert.  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  Able to follow simple one step motor commands with visual cues.  Motor (Limited by aphasia, but grossly): RUE: 1+-2 LUE 4+/5 RLE: 1+/5 LLE: 4+/5 Skin: Skin is warmand dry. Ecchymosis left posterior neck, LUE, left flank and left hip.    Assessment/Plan: 1. Functional deficits secondary to left intraparenchymal hemorrhage, subarachnoid hemorrhage, subdural hemorrhage which require 3+ hours  per day of interdisciplinary therapy in a comprehensive inpatient rehab setting. Physiatrist is providing close team supervision and 24 hour management of active medical problems listed below. Physiatrist and rehab team continue to assess barriers to discharge/monitor patient progress toward functional and medical goals.  Function:  Bathing Bathing position      Bathing parts      Bathing assist        Upper Body Dressing/Undressing Upper body dressing                    Upper body assist        Lower Body Dressing/Undressing Lower body dressing                                  Lower body assist        Toileting Toileting Toileting activity did not occur: Safety/medical concerns        Toileting assist     Transfers Chair/bed transfer Chair/bed transfer activity did not occur: N/A           Manufacturing systems engineer          Cognition Comprehension Comprehension assist level: Understands basic 50 - 74% of the time/ requires cueing 25 - 49% of the time  Expression Expression assist level: Expresses basic 25 - 49% of the time/requires cueing 50 - 75%  of the time. Uses single words/gestures.  Social Interaction Social Interaction assist level: Interacts appropriately 50 - 74% of the time - May be physically or verbally inappropriate.  Problem Solving Problem solving assist level: Solves basic 25 - 49% of the time - needs direction more than half the time to initiate, plan or complete simple activities  Memory Memory assist level: Recognizes or recalls 25 - 49% of the time/requires cueing 50 - 75% of the time     Medical Problem List and Plan: 1. Right hemiparesis, aphasia, cognitive dysfunction, gait disorder secondary to left intraparenchymal hemorrhage, subarachnoid hemorrhage, subdural hemorrhage following a fall 05/04/2016  Begin CIR 2. DVT Prophylaxis/Anticoagulation: Pharmaceutical: Lovenox 3. Pain Management:  Hydrocodone PRN.  4. Mood: Monitor for visual and behavioral changes. History of depression 5. Neuropsych: This patient is not capable of making decisions on hisown behalf. 6. Skin/Wound Care: Routine pressure relief measures 7. Fluids/Electrolytes/Nutrition: Monitor I/O. Appetite reported to be good.   Labs pending 8. PAF?/HTN: Monitor blood pressure and heart rate bid. Continue digoxin and Toprol XL.  9. Seizure disorder: On keppra bid.  10. E coli UTI: Treated.   Repeat Ucx pending  11. Urinary retention: Monitor voiding with PVR checks.   Toilet every 3-4 hours.  12. History of gout. Monitor for flareup 13. Review of primary care notes indicate has CODE STATUS: DO NOT RESUSCITATE, this was discussed with his wife, she will bring no code order from primary care physician.  14. HTN  Monitor with increased activity  Will adjust meds if necessary  LOS (Days) 1 A FACE TO FACE EVALUATION WAS PERFORMED  Danel Requena Lorie Phenix 05/13/2016 8:37 AM

## 2016-05-13 NOTE — IPOC Note (Signed)
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)   Benign essential HTN   Urinary retention   History of gout   Global aphasia   SAH (subarachnoid hemorrhage) ([facility])   SDH (subdural hematoma) ([facility])   Lymphocytosis     Functional Problem List: Nursing Bladder, Bowel, Endurance, Medication Management, Nutrition, Perception, Safety, Skin Integrity  PT Balance, Behavior, Edema, Endurance, Motor, Nutrition, Pain, Perception, Safety, Sensory  OT Balance, Behavior, Cognition, Endurance, Motor, Perception, Safety, Vision  SLP Cognition, Linguistic, Nutrition  TR         Basic ADL's: OT Eating, Grooming, Bathing, Dressing, Toileting     Advanced  ADL's: OT       Transfers: PT Bed Mobility, Bed to Chair, Car, Manufacturing systems engineer, Metallurgist: PT Stairs, Emergency planning/management officer, Ambulation     Additional Impairments: OT Fuctional Use of Upper Extremity  SLP Swallowing, Communication, Social Cognition comprehension, expression Problem Solving, Attention, Awareness  TR      Anticipated Outcomes Item Anticipated Outcome  Self Feeding Min A  Swallowing  min A for least restrictive   Basic self-care  Min A  Toileting  Min A   Bathroom Transfers Min A  Bowel/Bladder  Min assist  Transfers  min A  Locomotion  min A short distance household  Communication  mod A  Cognition  mod A  Pain  <3  Safety/Judgment  mod assist   Therapy Plan: PT Intensity: Minimum of 1-2 x/day ,45 to 90 minutes PT Frequency: 5 out of 7 days PT Duration Estimated Length of Stay: 14-17 days OT Intensity: Minimum of 1-2 x/day, 45 to 90 minutes OT Frequency: 5 out of 7 days OT Duration/Estimated Length of Stay: 14-17 days SLP Intensity: Minumum of 1-2 x/day, 30 to 90  minutes SLP Frequency: 3 to 5 out of 7 days       Team Interventions: Nursing Interventions Patient/Family Education, Bladder Management, Bowel Management, Disease Management/Prevention, Medication Management, Skin Care/Wound Management, Cognitive Remediation/Compensation, Discharge Planning, Psychosocial Support  PT interventions Ambulation/gait training, Balance/vestibular training, Cognitive remediation/compensation, Community reintegration, Discharge planning, Disease management/prevention, DME/adaptive equipment instruction, Functional electrical stimulation, Functional mobility training, Neuromuscular re-education, Pain management, Patient/family education, Psychosocial support, Splinting/orthotics, Stair training, Therapeutic Activities, Therapeutic Exercise, UE/LE Strength taining/ROM, UE/LE Coordination activities, Visual/perceptual remediation/compensation, Wheelchair propulsion/positioning  OT Interventions Training and development officer, Cognitive remediation/compensation, Academic librarian, Discharge planning, DME/adaptive equipment instruction, Functional mobility training, Neuromuscular re-education, Patient/family education, Self Care/advanced ADL retraining, Therapeutic Activities, Therapeutic Exercise, UE/LE Strength taining/ROM, UE/LE Coordination activities, Visual/perceptual remediation/compensation, Wheelchair propulsion/positioning  SLP Interventions Cognitive remediation/compensation, Dysphagia/aspiration precaution training, Speech/Language facilitation, English as a second language teacher, Patient/family education, Functional tasks, Multimodal communication approach  TR Interventions    SW/CM Interventions Discharge Planning, Psychosocial Support, Patient/Family Education    Team Discharge Planning: Destination: PT-Home (ILF with wife) ,OT- Home , SLP-  Projected Follow-up: PT-Home health PT, 24 hour supervision/assistance, OT-  Home health OT, SLP-Home Health SLP, 24 hour  supervision/assistance (to be determined) Projected Equipment Needs: PT-To be determined, OT- To be determined, SLP-  Equipment Details: PT- , OT-  Patient/family involved in discharge planning: PT- Family member/caregiver,  OT-Patient, SLP-Patient unable/family or caregive not available  MD ELOS: 14-17 days. Medical Rehab Prognosis:  Fair Assessment:  75 year old male with history of TBI with SDH s/p crani 2005 and 2009  with residual aphasia, visual deficits and right hemiparesis, ETOH abuse, gout, seizure disorder who was admitted to [facility] on 05/04/16 after fall [place]. He was found to have 9 mm left IPH, bilateral SDH and SAH. NS recommended conservative care with serial CT. Follow up CT head with increase in bleed but neurologically reported to be improving. NO surgical intervention needed per NS. He was treated with rocephin for UTI. His diet upgraded to regular thin with small bits and cues to clear residuals due to impulsivity. He has had worsening of right sided weakness, increase in visual deficits and worsening of aphasia, workup ongoing. Will set goals for Min A with PT/OT and Mod A with SLP.    See Team Conference Notes for weekly updates to the plan of care

## 2016-05-13 NOTE — Progress Notes (Signed)
Per report pt noted to have decline in function with therapies and per wife.  Workup initiated, including CT head.

## 2016-05-14 ENCOUNTER — Inpatient Hospital Stay (HOSPITAL_COMMUNITY): Payer: Medicare Other

## 2016-05-14 DIAGNOSIS — E871 Hypo-osmolality and hyponatremia: Secondary | ICD-10-CM | POA: Diagnosis present

## 2016-05-14 DIAGNOSIS — E876 Hypokalemia: Secondary | ICD-10-CM | POA: Insufficient documentation

## 2016-05-14 DIAGNOSIS — D72829 Elevated white blood cell count, unspecified: Secondary | ICD-10-CM | POA: Diagnosis present

## 2016-05-14 DIAGNOSIS — R8290 Unspecified abnormal findings in urine: Secondary | ICD-10-CM | POA: Insufficient documentation

## 2016-05-14 LAB — URINE CULTURE
CULTURE: NO GROWTH
Culture: NO GROWTH

## 2016-05-14 MED ORDER — METOPROLOL TARTRATE 50 MG PO TABS
50.0000 mg | ORAL_TABLET | Freq: Two times a day (BID) | ORAL | Status: DC
  Administered 2016-05-14 – 2016-05-27 (×24): 50 mg via ORAL
  Filled 2016-05-14 (×24): qty 1
  Filled 2016-05-14: qty 2
  Filled 2016-05-14 (×2): qty 1

## 2016-05-14 MED ORDER — LEVETIRACETAM 100 MG/ML PO SOLN
500.0000 mg | Freq: Two times a day (BID) | ORAL | Status: DC
  Administered 2016-05-14 – 2016-05-27 (×27): 500 mg via ORAL
  Filled 2016-05-14 (×27): qty 5

## 2016-05-14 MED ORDER — RESOURCE THICKENUP CLEAR PO POWD
ORAL | Status: DC | PRN
  Filled 2016-05-14: qty 125

## 2016-05-14 NOTE — Progress Notes (Signed)
[place] PHYSICAL MEDICINE & REHABILITATION     PROGRESS NOTE  Subjective/Complaints:  Pt laying in bed early this AM.  He is sleepy.    ROS: Unable to assess due to language.  Objective: Vital Signs: Blood pressure (!) 144/76, pulse 68, temperature 97.7 F (36.5 C), temperature source Oral, resp. rate 17, height 5\' 11"  (1.803 m), weight 95.8 kg (211 lb 4.8 oz), SpO2 97 %. Ct Head Wo Contrast  Addendum Date: 05/13/2016   ADDENDUM REPORT: 05/13/2016 17:51 ADDENDUM: Prior head CTs from new [facility] dated 05/10/2016 and 05/04/2016 have become available for comparison on [employer]. Also, today's exam was further reviewed with Neuroradiologist Dr. Sharl Ma. The extensive, indistinct hyperdense hemorrhage - which is not associated with any parenchymal edema - in the left hemisphere, the anterior inferior right hemisphere, and the left superior cerebellum is in fact lobulated Subdural Hematoma. On the 05/04/2016 study there was also superimposed left hemisphere subarachnoid hemorrhage which has regressed. Today study demonstrates no significant change from that on 05/10/2016. CONCLUSION: The previously described unusual extensive left hemisphere hemorrhage is lobulated Subdural Hematoma (is extra-axial, not intra-axial). That, along with the anterior left frontal lobe hemorrhagic contusion, the right subdural hematoma, and the parafalcine/tentorial subdural hematoma, are stable compared to outside Head CT on 05/10/2016. This information was relayed to Dr. Posey Pronto at 5:51 pm on 05/13/2016. Electronically Signed   By: Genevie Ann M.D.   On: 05/13/2016 17:51   Addendum Date: 05/13/2016   ADDENDUM REPORT: 05/13/2016 15:21 ADDENDUM: Study discussed by telephone with Dr. Delice Lesch On 05/13/2016 at 1457 hours. He advised that the patient was transferred recently from new [facility]. There are noncontrast head CTs from that facility which have been provided on compact disc. I  am having those loaded into [facility] PACS, and once loaded will issue another addendum comparing to the current exam. Electronically Signed   By: Genevie Ann M.D.   On: 05/13/2016 15:21   Result Date: 05/13/2016 CLINICAL DATA:  75 year old male status post fall down stairs with intracranial hemorrhage. Initial encounter. EXAM: CT HEAD WITHOUT CONTRAST TECHNIQUE: Contiguous axial images were obtained from the base of the skull through the vertex without intravenous contrast. COMPARISON:  Brain MRI without contrast 04/12/2011. FINDINGS: Brain: Chronic posterior left hemisphere encephalomalacia with ex vacuo ventricle enlargement maximal at the occipital horn and atrium of the ventricle. Superimposed vague intra-axial appearing hemorrhage throughout the residual left occipital lobe and left temporal lobes. More typical appearing area of anterior left frontal lobe intra-axial hemorrhage with surrounding edema. This latter area of parenchymal blood encompasses 35 x 32 x 36 mm (AP by transverse by CC). Surrounding edema. However, along the inferior most anterior left frontal lobe there is also an area of the more fade hemorrhage as seen on series 4, image 22. Similar appearing unusual indistinct intra-axial hemorrhage in the left superior cerebellum as seen on coronal image 45. In the contralateral right hemisphere there is mixed density right subdural hematoma measuring about 6 mm in thickness throughout much of its course. However, in the inferior anterior right frontal lobe there also appears to be a small area of indistinct intra-axial hemorrhage as seen on series 2, image 13. There is no definite intraventricular hemorrhage. There is layering parafalcine and bilateral tentorial subdural hematoma which is only up to 3-4 mm in thickness. No definite subarachnoid hemorrhage. Regional mass effect on the left frontal horn. Anterior rightward midline shift up to 10 mm, although no midline shift  at the level of the posterior  septum pellucidum. No definite acute cortically based infarct. Skull: .Sequelae of left hemi craniectomy with reconstruction. Several vertex burr holes on the right. No acute skull fracture Sinuses/Orbits: Visualized paranasal sinuses and mastoids are stable and well pneumatized. Other: Postoperative changes to the scalp. Superimposed broad-based right vertex scalp hematoma measuring 3-4 mm in thickness. No acute orbits soft tissue findings. IMPRESSION: 1. Unusual appearance of extensive apparently intra-axial hemorrhage throughout the left temporal and occipital lobes. Similar indistinct intra-axial appearing hemorrhage in both anterior inferior frontal gyri, and to the left superior cerebellum. 2. Superimposed more typical appearing left anterior superior frontal lobe intra-axial hematoma. That lesion has an estimated blood volume of 20 mL. There is surrounding vasogenic edema, and regional mass effect including rightward midline shift up to 10 mm at the level of the frontal horn. 3. Superimposed widespread peripheral right mixed density subdural hematoma measuring up to 6 mm in thickness. Superimposed small volume of generalized parafalcine and tentorial subdural hematoma. 4. Underlying chronic left hemisphere encephalomalacia with ex vacuo enlargement of the left lateral ventricle. 5. Sequelae of previous left craniectomy with reconstruction. No acute skull fracture identified. Electronically Signed: By: Genevie Ann M.D. On: 05/13/2016 14:43   Dg Swallowing Func-speech Pathology  Result Date: 05/13/2016 Objective Swallowing Evaluation: Type of Study: MBS-Modified Barium Swallow Study Patient Details Name: Karandeep Haskill MRN: [id number] Date of Birth: [birth year] Today's Date: 05/13/2016 Time: [id number] Total: 11 Past Medical History: Past Medical History: Diagnosis Date . Alcohol abuse, in remission  . Atrial fibrillation ([place])  . Bilateral renal masses  . Closed right ankle fracture  . 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 Gait disorder  . Gout  . Hypertension  . Seizure disorder ([place])  . 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  Past Surgical History: Past Surgical History: Procedure Laterality Date . CRANIECTOMY FOR DEPRESSED SKULL FRACTURE  2009  with MRSA infection . CRANIOTOMY    X 2 . HERNIA REPAIR    during childhood . ORIF ANKLE FRACTURE Left 2012 HPI: Elic Alberti is a 75 year old male with history of TBI with SDH s/p crani 2005 and 2009 with residual aphasia, visual deficits and right hemiparesis, ETOH abuse, gout, seizure disorder who was admitted to [facility] on 05/04/16 after [place]. He was found to have 9 mm left IPH, bilateral SDH and SAH. NS recommended conservative care with serial CT. Follow up CT head with increase in bleed but neurologically reported to be improving. NO surgical intervention needed per NS. He was treated with rocephin for UTI. His diet upgraded to regular thin with small bits and cues to clear residuals due to impulsivity. He has had worsening of right sided weakness, increase in visual deficits and worsening of aphasia. This morning, pt noted to have change in swallow function.  No Data Recorded Assessment / Plan / Recommendation CHL IP CLINICAL IMPRESSIONS 05/13/2016 Therapy Diagnosis Moderate oral phase dysphagia;Moderate pharyngeal phase dysphagia Clinical Impression Pt presents with moderate sensorimotor oropharyngeal phase dysphagia characterized by poor bolus management including anterior spillage and oral holding with thins. Prolonged mastication and significant oral residue with solids (required a puree bolus to clear). Delayed pahryngeal initiation with all items with thin liquids pooling in the pyriform prior to swallow initiation.  Pt ultimately demonstrated silent aspiration of thins and intermittent  flash  laryngeal penetration with nectar. Thicker viscosities (puree and solid with puree) resulted in significant pyriform residue. Pt could not follow directives for dry swallow or any other compensatory strategies. Pt is at risk for aspiration given performance on MBSS and concern for potential for decline to evolve given changes from report. Therefore, cautious initiation of modified diet listed below is recommended at this time. Low threshold for NPO if any further decline noted.   Impact on safety and function Moderate aspiration risk   CHL IP TREATMENT RECOMMENDATION 05/13/2016 Treatment Recommendations Therapy as outlined in treatment plan below   Prognosis 05/13/2016 Prognosis for Safe Diet Advancement Fair Barriers to Reach Goals Cognitive deficits;Language deficits;Severity of deficits Barriers/Prognosis Comment -- CHL IP DIET RECOMMENDATION 05/13/2016 SLP Diet Recommendations Dysphagia 1 (Puree) solids;Nectar thick liquid Liquid Administration via Cup;No straw Medication Administration Crushed with puree Compensations Minimize environmental distractions;Slow rate;Small sips/bites Postural Changes Seated upright at 90 degrees;Remain semi-upright after after feeds/meals (Comment)   CHL IP OTHER RECOMMENDATIONS 05/13/2016 Recommended Consults -- Oral Care Recommendations Oral care BID Other Recommendations Order thickener from pharmacy;Prohibited food (jello, ice cream, thin soups);Remove water pitcher   CHL IP FOLLOW UP RECOMMENDATIONS 05/13/2016 Follow up Recommendations Inpatient Rehab   CHL IP FREQUENCY AND DURATION 05/13/2016 Speech Therapy Frequency (ACUTE ONLY) min 5x/week Treatment Duration 2 weeks      CHL IP ORAL PHASE 05/13/2016 Oral Phase Impaired Oral - Pudding Teaspoon -- Oral - Pudding Cup -- Oral - Honey Teaspoon -- Oral - Honey Cup -- Oral - Nectar Teaspoon -- Oral - Nectar Cup -- Oral - Nectar Straw -- Oral - Thin Teaspoon -- Oral - Thin Cup -- Oral - Thin Straw -- Oral - Puree -- Oral - Mech  Soft -- Oral - Regular -- Oral - Multi-Consistency -- Oral - Pill -- Oral Phase - Comment oral holding and residue with all consistencies, premature spillage to the pyriforms with thin  CHL IP PHARYNGEAL PHASE 05/13/2016 Pharyngeal Phase Impaired Pharyngeal- Pudding Teaspoon -- Pharyngeal -- Pharyngeal- Pudding Cup -- Pharyngeal -- Pharyngeal- Honey Teaspoon -- Pharyngeal -- Pharyngeal- Honey Cup -- Pharyngeal -- Pharyngeal- Nectar Teaspoon -- Pharyngeal -- Pharyngeal- Nectar Cup Delayed swallow initiation-pyriform sinuses;Delayed swallow initiation-vallecula;Penetration/Apiration after swallow Pharyngeal Material enters airway, remains ABOVE vocal cords then ejected out Pharyngeal- Nectar Straw -- Pharyngeal -- Pharyngeal- Thin Teaspoon -- Pharyngeal -- Pharyngeal- Thin Cup Moderate aspiration;Delayed swallow initiation-pyriform sinuses Pharyngeal -- Pharyngeal- Thin Straw -- Pharyngeal -- Pharyngeal- Puree Delayed swallow initiation-vallecula;Pharyngeal residue - valleculae Pharyngeal -- Pharyngeal- Mechanical Soft -- Pharyngeal -- Pharyngeal- Regular Delayed swallow initiation-vallecula;Pharyngeal residue - valleculae Pharyngeal -- Pharyngeal- Multi-consistency -- Pharyngeal -- Pharyngeal- Pill -- Pharyngeal -- Pharyngeal Comment --  CHL IP CERVICAL ESOPHAGEAL PHASE 05/13/2016 Cervical Esophageal Phase WFL Pudding Teaspoon -- Pudding Cup -- Honey Teaspoon -- Honey Cup -- Nectar Teaspoon -- Nectar Cup -- Nectar Straw -- Thin Teaspoon -- Thin Cup -- Thin Straw -- Puree -- Mechanical Soft -- Regular -- Multi-consistency -- Pill -- Cervical Esophageal Comment -- No flowsheet data found. Vinetta Bergamo MA, CCC-SLP 05/13/2016, 3:00 PM               Recent Labs  05/13/16 0858  WBC 13.4*  HGB 13.3  HCT 39.1  PLT 248    Recent Labs  05/13/16 0858  NA 131*  K 3.4*  CL 94*  GLUCOSE 143*  BUN 13  CREATININE 0.78  CALCIUM 8.6*   CBG (last 3)  No results for input(s): GLUCAP in the last  72 hours.  Wt  Readings from Last 3 Encounters:  05/12/16 95.8 kg (211 lb 4.8 oz)  05/11/16 90.7 kg (200 lb)    Physical Exam:  BP (!) 144/76 (BP Location: Left Arm)   Pulse 68   Temp 97.7 F (36.5 C) (Oral)   Resp 17   Ht 5\' 11"  (1.803 m)   Wt 95.8 kg (211 lb 4.8 oz)   SpO2 97%   BMI 29.47 kg/m  Constitutional: He appears well-developedand well-nourished. NAD.  Head: Old healed incision on scalp Eyes: Conjunctivaeand EOMare normal.  Cardiovascular: Normal rateand regular rhythm.  Respiratory: Breath sounds normal. No stridor. No respiratory distress. He has no wheezes.  GI: Soft. Bowel sounds are normal. He exhibits no distension. There is no tenderness.  Musculoskeletal: He exhibits no edemaor tenderness.  Ecchymosis LUE.  Neurological: He is alert.  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  Sleepy this AM, not cooperating with MMT, was: able to follow simple one step motor commands with visual cues.  Motor (Limited by aphasia, but grossly): RUE: 1+-2 LUE 4+/5 RLE: 1+/5 LLE: 4+/5 Skin: Skin is warmand dry. Ecchymosis left posterior neck, LUE, left flank and left hip.   Assessment/Plan: 1. Functional deficits secondary to left intraparenchymal hemorrhage, subarachnoid hemorrhage, subdural hemorrhage which require 3+ hours per day of interdisciplinary therapy in a comprehensive inpatient rehab setting. Physiatrist is providing close team supervision and 24 hour management of active medical problems listed below. Physiatrist and rehab team continue to assess barriers to discharge/monitor patient progress toward functional and medical goals.  Function:  Bathing Bathing position   Position: Shower  Bathing parts Body parts bathed by patient: Right arm, Chest, Abdomen, Front perineal area, Right upper leg, Left upper leg Body parts bathed by helper: Right lower leg, Left lower leg, Buttocks, Left arm  Bathing assist Assist Level:  (Mod A)      Upper Body  Dressing/Undressing Upper body dressing   What is the patient wearing?: Pull over shirt/dress     Pull over shirt/dress - Perfomed by patient: Put head through opening Pull over shirt/dress - Perfomed by helper: Thread/unthread right sleeve, Thread/unthread left sleeve, Pull shirt over trunk, Put head through opening        Upper body assist Assist Level:  (Total A)      Lower Body Dressing/Undressing Lower body dressing   What is the patient wearing?: Pants, Non-skid slipper socks       Pants- Performed by helper: Thread/unthread right pants leg, Thread/unthread left pants leg, Pull pants up/down   Non-skid slipper socks- Performed by helper: Don/doff right sock, Don/doff left sock                  Lower body assist Assist for lower body dressing:  (Total A)      Toileting Toileting Toileting activity did not occur: Safety/medical concerns   Toileting steps completed by helper: Adjust clothing prior to toileting, Performs perineal hygiene, Adjust clothing after toileting Toileting Assistive Devices: Grab bar or rail  Toileting assist Assist level:  (Total A)   Transfers Chair/bed transfer Chair/bed transfer activity did not occur: N/A Chair/bed transfer method: Stand pivot Chair/bed transfer assist level: Maximal assist (Pt 25 - 49%/lift and lower) Chair/bed transfer assistive device: Armrests     Locomotion Ambulation     Max distance: 20 ft Assist level: 2 helpers   Wheelchair   Type: Manual Max wheelchair distance: 15 ft Assist Level: Total assistance (Pt < 25%)  Cognition Comprehension  Comprehension assist level: Understands basic 25 - 49% of the time/ requires cueing 50 - 75% of the time  Expression Expression assist level: Expresses basic 25 - 49% of the time/requires cueing 50 - 75% of the time. Uses single words/gestures.  Social Interaction Social Interaction assist level: Interacts appropriately less than 25% of the time. May be withdrawn or  combative.  Problem Solving Problem solving assist level: Solves basic less than 25% of the time - needs direction nearly all the time or does not effectively solve problems and may need a restraint for safety  Memory Memory assist level: Recognizes or recalls less than 25% of the time/requires cueing greater than 75% of the time     Medical Problem List and Plan: 1. Right hemiparesis, aphasia, cognitive dysfunction, gait disorder secondary to left intraparenchymal hemorrhage, subarachnoid hemorrhage, subdural hemorrhage following a fall 05/04/2016  Cont CIR  Repeat Head CT reviewed, spoke with Radiologist, appears to be significant, but stable bleed 2. DVT Prophylaxis/Anticoagulation: Pharmaceutical: Lovenox 3. Pain Management: Hydrocodone PRN.  4. Mood: Monitor for visual and behavioral changes. History of depression 5. Neuropsych: This patient is not capable of making decisions on hisown behalf. 6. Skin/Wound Care: Routine pressure relief measures 7. Fluids/Electrolytes/Nutrition: Monitor I/O. Appetite reported to be good.  8. PAF?/HTN: Monitor blood pressure and heart rate bid. Continue digoxin and Toprol XL.  9. Seizure disorder: On keppra bid.  10. E coli UTI: Treated.   Ua with rare bacteria - empiric macrobid initiated.  Repeat Ucx pending  11. Urinary retention: Monitor voiding with PVR checks.   Toilet every 3-4 hours.  12. History of gout. Monitor for flareup 13. Review of primary care notes indicate has CODE STATUS: DO NOT RESUSCITATE, this was discussed with his wife, she will bring no code order from primary care physician.  14. HTN  Monitor with increased activity  Will adjust meds if necessary 15. Hypokalemia  3.4 on 10/20  Supplemented  Will cont to monitor 16. Hyponatremia  Na+ 131 on 10/20   Will cont to monitor 17. Leukocytosis  WBCs 13.4 on 10/20  ?UTI  CXR ordered  LOS (Days) 2 A FACE TO FACE EVALUATION WAS PERFORMED  Aianna Fahs Lorie Phenix 05/14/2016  9:20 AM

## 2016-05-15 ENCOUNTER — Inpatient Hospital Stay (HOSPITAL_COMMUNITY): Payer: Medicare HMO | Admitting: Occupational Therapy

## 2016-05-15 ENCOUNTER — Inpatient Hospital Stay (HOSPITAL_COMMUNITY): Payer: Medicare Other

## 2016-05-15 ENCOUNTER — Inpatient Hospital Stay (HOSPITAL_COMMUNITY): Payer: Medicare Other | Admitting: Physical Therapy

## 2016-05-15 NOTE — Progress Notes (Signed)
Occupational Therapy Session Note  Patient Details  Name: James [name] MRN: [id number] Date of Birth: December 25, 1940  Today's Date: 05/15/2016 OT Individual Time: 1500-1530 OT Individual Time Calculation (min): 30 min   Short Term Goals: Week 1:  OT Short Term Goal 1 (Week 1): Pt will appropriately use self-care objects with mod instructional cues. OT Short Term Goal 2 (Week 1): Pt will don shirt using hemi-techniques and mod A OT Short Term Goal 3 (Week 1): Pt will complete shower transfer with Mod A OT Short Term Goal 4 (Week 1): Pt will attend to 3 minute ADL task in minimally distractive environment with min cues  Skilled Therapeutic Interventions/Progress Updates:   ADL-retraining with focus on bed mobility, dynamic sitting balance, lower body dressing (shorts only), NMR of RUE and seated grooming.   Pt able to follow single step commands to perform assisted bed mobility and dynamic sitting task at EOB with manual facilitation to weight-shift, manual facilitation for placement and tactile cues to maintain sitting balance at EOB.   With bed elevated to 25", pt performed assisted stand and donned his pants with mod assist, then completed a lateral transfer to w/c with mod assist, armrest raised.   Pt then groomed at sink in w/c, brushing his hair and beard with his left hand; unable to use right due to weak grip although allowing hand-over-and guidance to attempt use of right hand in task.   Pt performed similar transfer back to EOB to doff pants and returned to supine with mod assist to lift both legs and reposition toward head of bed.  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, impaired vision, R sided weakness Restrictions Weight Bearing Restrictions: No    Pain:Pain Assessment Pain Assessment: No/denies pain   ADL: ADL ADL Comments: See functional navigator    See Function Navigator for Current  Functional Status.   Therapy/Group: Individual Therapy  [place] 05/15/2016, 3:50 PM

## 2016-05-15 NOTE — Progress Notes (Signed)
Occupational Therapy Session Note  Patient Details  Name: James [name] MRN: [id number] Date of Birth: 1941/05/03  Today's Date: 05/15/2016 OT Individual Time: [id number] Individual Time Calculation (min): 62 min   Short Term Goals: Week 1:  OT Short Term Goal 1 (Week 1): Pt will appropriately use self-care objects with mod instructional cues. OT Short Term Goal 2 (Week 1): Pt will don shirt using hemi-techniques and mod A OT Short Term Goal 3 (Week 1): Pt will complete shower transfer with Mod A OT Short Term Goal 4 (Week 1): Pt will attend to 3 minute ADL task in minimally distractive environment with min cues  Skilled Therapeutic Interventions/Progress Updates:   Pt participated in skilled OT session focusing on body awareness, visual scanning/assessment, cognition, and R UE NMR. Pt was received in w/c with safety belt in place and agreeable to tx. Pt was taken to gym to engage in graded activity copying patterns with geometric shapes. Pt exhibited 75% accuracy with visual cues and 2-3 piece patterns. Pt able to visually locate items of same shape/size/color in mixed box. Visual scanning activity while reaching cones utilized at [facility] with cues for scanning to right visual field. Decreased depth perception noted with pt overshooting targets. HOH assist for all R UE reaching activities due to decreased awareness. Crossing midline and bringing hands to center emphasized during UE NMR.  Visual biofeedback provided for increasing proprioceptive awareness with pt identifying body parts. Heavy tactile cues provided to R UE. At end of tx pt was returned to room, safety belt donned and all needs within reach. Wife James [name] was also present and educated on room-based NMR activities that she could engage pt in. Peggy very receptive to education and verbalized understanding.  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, impaired  vision, R sided weakness Restrictions Weight Bearing Restrictions: No General:   Vital Signs: Therapy Vitals Pulse Rate: 82 BP: 110/80 Pain: Pain Assessment Pain Assessment: Faces Faces Pain Scale: No hurt ADL: ADL ADL Comments: See functional navigator     See Function Navigator for Current Functional Status.   Therapy/Group: Individual Therapy  Jeanmarc Viernes A Samir Ishaq 05/15/2016, 12:45 PM

## 2016-05-15 NOTE — Progress Notes (Signed)
[place] PHYSICAL MEDICINE & REHABILITATION     PROGRESS NOTE  Subjective/Complaints:  Pt sitting up in bed this AM.  He is more alert this AM.    ROS: Unable to assess due to language.  Objective: Vital Signs: Blood pressure 138/77, pulse 74, temperature 99 F (37.2 C), temperature source Oral, resp. rate 17, height 5\' 11"  (1.803 m), weight 95.8 kg (211 lb 4.8 oz), SpO2 95 %. Dg Chest 2 View  Result Date: 05/14/2016 CLINICAL DATA:  75 year old male with history of leukocytosis. History of atrial fibrillation. Hypertension. EXAM: CHEST  2 VIEW COMPARISON:  Chest x-ray 04/20/2011. FINDINGS: Lung volumes are low. No consolidative airspace disease. No definite pleural effusions. Ill-defined bibasilar opacities, favored to reflect areas of subsegmental atelectasis given the low lung volumes. No evidence of pulmonary edema. Heart size is mildly enlarged. Upper mediastinal contours are within normal limits. Aortic atherosclerosis. IMPRESSION: 1. Low lung volumes with bibasilar subsegmental atelectasis. 2. Aortic atherosclerosis. 3. Mild cardiomegaly. Electronically Signed   By: Vinnie Langton M.D.   On: 05/14/2016 13:42   Ct Head Wo Contrast  Addendum Date: 05/13/2016   ADDENDUM REPORT: 05/13/2016 17:51 ADDENDUM: Prior head CTs from new [facility] dated 05/10/2016 and 05/04/2016 have become available for comparison on [employer]. Also, today's exam was further reviewed with Neuroradiologist Dr. Sharl Ma. The extensive, indistinct hyperdense hemorrhage - which is not associated with any parenchymal edema - in the left hemisphere, the anterior inferior right hemisphere, and the left superior cerebellum is in fact lobulated Subdural Hematoma. On the 05/04/2016 study there was also superimposed left hemisphere subarachnoid hemorrhage which has regressed. Today study demonstrates no significant change from that on 05/10/2016. CONCLUSION: The previously described unusual extensive  left hemisphere hemorrhage is lobulated Subdural Hematoma (is extra-axial, not intra-axial). That, along with the anterior left frontal lobe hemorrhagic contusion, the right subdural hematoma, and the parafalcine/tentorial subdural hematoma, are stable compared to outside Head CT on 05/10/2016. This information was relayed to Dr. Posey Pronto at 5:51 pm on 05/13/2016. Electronically Signed   By: Genevie Ann M.D.   On: 05/13/2016 17:51   Addendum Date: 05/13/2016   ADDENDUM REPORT: 05/13/2016 15:21 ADDENDUM: Study discussed by telephone with Dr. Delice Lesch On 05/13/2016 at 1457 hours. He advised that the patient was transferred recently from new [facility]. There are noncontrast head CTs from that facility which have been provided on compact disc. I am having those loaded into [facility] PACS, and once loaded will issue another addendum comparing to the current exam. Electronically Signed   By: Genevie Ann M.D.   On: 05/13/2016 15:21   Result Date: 05/13/2016 CLINICAL DATA:  75 year old male status post fall down stairs with intracranial hemorrhage. Initial encounter. EXAM: CT HEAD WITHOUT CONTRAST TECHNIQUE: Contiguous axial images were obtained from the base of the skull through the vertex without intravenous contrast. COMPARISON:  Brain MRI without contrast 04/12/2011. FINDINGS: Brain: Chronic posterior left hemisphere encephalomalacia with ex vacuo ventricle enlargement maximal at the occipital horn and atrium of the ventricle. Superimposed vague intra-axial appearing hemorrhage throughout the residual left occipital lobe and left temporal lobes. More typical appearing area of anterior left frontal lobe intra-axial hemorrhage with surrounding edema. This latter area of parenchymal blood encompasses 35 x 32 x 36 mm (AP by transverse by CC). Surrounding edema. However, along the inferior most anterior left frontal lobe there is also an area of the more fade hemorrhage as seen on series 4, image 22. Similar  appearing  unusual indistinct intra-axial hemorrhage in the left superior cerebellum as seen on coronal image 45. In the contralateral right hemisphere there is mixed density right subdural hematoma measuring about 6 mm in thickness throughout much of its course. However, in the inferior anterior right frontal lobe there also appears to be a small area of indistinct intra-axial hemorrhage as seen on series 2, image 13. There is no definite intraventricular hemorrhage. There is layering parafalcine and bilateral tentorial subdural hematoma which is only up to 3-4 mm in thickness. No definite subarachnoid hemorrhage. Regional mass effect on the left frontal horn. Anterior rightward midline shift up to 10 mm, although no midline shift at the level of the posterior septum pellucidum. No definite acute cortically based infarct. Skull: .Sequelae of left hemi craniectomy with reconstruction. Several vertex burr holes on the right. No acute skull fracture Sinuses/Orbits: Visualized paranasal sinuses and mastoids are stable and well pneumatized. Other: Postoperative changes to the scalp. Superimposed broad-based right vertex scalp hematoma measuring 3-4 mm in thickness. No acute orbits soft tissue findings. IMPRESSION: 1. Unusual appearance of extensive apparently intra-axial hemorrhage throughout the left temporal and occipital lobes. Similar indistinct intra-axial appearing hemorrhage in both anterior inferior frontal gyri, and to the left superior cerebellum. 2. Superimposed more typical appearing left anterior superior frontal lobe intra-axial hematoma. That lesion has an estimated blood volume of 20 mL. There is surrounding vasogenic edema, and regional mass effect including rightward midline shift up to 10 mm at the level of the frontal horn. 3. Superimposed widespread peripheral right mixed density subdural hematoma measuring up to 6 mm in thickness. Superimposed small volume of generalized parafalcine and tentorial  subdural hematoma. 4. Underlying chronic left hemisphere encephalomalacia with ex vacuo enlargement of the left lateral ventricle. 5. Sequelae of previous left craniectomy with reconstruction. No acute skull fracture identified. Electronically Signed: By: Genevie Ann M.D. On: 05/13/2016 14:43   Dg Swallowing Func-speech Pathology  Result Date: 05/13/2016 Objective Swallowing Evaluation: Type of Study: MBS-Modified Barium Swallow Study Patient Details Name: James [name] MRN: [id number] Date of Birth: May 21, 1941 Today's Date: 05/13/2016 Time: [id number] Total: 29 Past Medical History: Past Medical History: Diagnosis Date . Alcohol abuse, in remission  . Atrial fibrillation ([place])  . Bilateral renal masses  . Closed right ankle fracture  . 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 Gait disorder  . Gout  . Hypertension  . Seizure disorder ([place])  . 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  Past Surgical History: Past Surgical History: Procedure Laterality Date . CRANIECTOMY FOR DEPRESSED SKULL FRACTURE  2009  with MRSA infection . CRANIOTOMY    X 2 . HERNIA REPAIR    during childhood . ORIF ANKLE FRACTURE Left 2012 HPI: Petra Whittiker is a 75 year old male with history of TBI with SDH s/p crani 2005 and 2009 with residual aphasia, visual deficits and right hemiparesis, ETOH abuse, gout, seizure disorder who was admitted to [facility] on 05/04/16 after [place]. He was found to have 9 mm left IPH, bilateral SDH and SAH. NS recommended conservative care with serial CT. Follow up CT head with increase in bleed but neurologically reported to be improving. NO surgical intervention needed per NS. He was treated with rocephin for UTI. His diet upgraded to regular thin with small bits and cues to clear residuals due to impulsivity. He  has had worsening of right sided weakness,  increase in visual deficits and worsening of aphasia. This morning, pt noted to have change in swallow function.  No Data Recorded Assessment / Plan / Recommendation CHL IP CLINICAL IMPRESSIONS 05/13/2016 Therapy Diagnosis Moderate oral phase dysphagia;Moderate pharyngeal phase dysphagia Clinical Impression Pt presents with moderate sensorimotor oropharyngeal phase dysphagia characterized by poor bolus management including anterior spillage and oral holding with thins. Prolonged mastication and significant oral residue with solids (required a puree bolus to clear). Delayed pahryngeal initiation with all items with thin liquids pooling in the pyriform prior to swallow initiation.  Pt ultimately demonstrated silent aspiration of thins and intermittent flash laryngeal penetration with nectar. Thicker viscosities (puree and solid with puree) resulted in significant pyriform residue. Pt could not follow directives for dry swallow or any other compensatory strategies. Pt is at risk for aspiration given performance on MBSS and concern for potential for decline to evolve given changes from report. Therefore, cautious initiation of modified diet listed below is recommended at this time. Low threshold for NPO if any further decline noted.   Impact on safety and function Moderate aspiration risk   CHL IP TREATMENT RECOMMENDATION 05/13/2016 Treatment Recommendations Therapy as outlined in treatment plan below   Prognosis 05/13/2016 Prognosis for Safe Diet Advancement Fair Barriers to Reach Goals Cognitive deficits;Language deficits;Severity of deficits Barriers/Prognosis Comment -- CHL IP DIET RECOMMENDATION 05/13/2016 SLP Diet Recommendations Dysphagia 1 (Puree) solids;Nectar thick liquid Liquid Administration via Cup;No straw Medication Administration Crushed with puree Compensations Minimize environmental distractions;Slow rate;Small sips/bites Postural Changes Seated upright at 90 degrees;Remain semi-upright after after  feeds/meals (Comment)   CHL IP OTHER RECOMMENDATIONS 05/13/2016 Recommended Consults -- Oral Care Recommendations Oral care BID Other Recommendations Order thickener from pharmacy;Prohibited food (jello, ice cream, thin soups);Remove water pitcher   CHL IP FOLLOW UP RECOMMENDATIONS 05/13/2016 Follow up Recommendations Inpatient Rehab   CHL IP FREQUENCY AND DURATION 05/13/2016 Speech Therapy Frequency (ACUTE ONLY) min 5x/week Treatment Duration 2 weeks      CHL IP ORAL PHASE 05/13/2016 Oral Phase Impaired Oral - Pudding Teaspoon -- Oral - Pudding Cup -- Oral - Honey Teaspoon -- Oral - Honey Cup -- Oral - Nectar Teaspoon -- Oral - Nectar Cup -- Oral - Nectar Straw -- Oral - Thin Teaspoon -- Oral - Thin Cup -- Oral - Thin Straw -- Oral - Puree -- Oral - Mech Soft -- Oral - Regular -- Oral - Multi-Consistency -- Oral - Pill -- Oral Phase - Comment oral holding and residue with all consistencies, premature spillage to the pyriforms with thin  CHL IP PHARYNGEAL PHASE 05/13/2016 Pharyngeal Phase Impaired Pharyngeal- Pudding Teaspoon -- Pharyngeal -- Pharyngeal- Pudding Cup -- Pharyngeal -- Pharyngeal- Honey Teaspoon -- Pharyngeal -- Pharyngeal- Honey Cup -- Pharyngeal -- Pharyngeal- Nectar Teaspoon -- Pharyngeal -- Pharyngeal- Nectar Cup Delayed swallow initiation-pyriform sinuses;Delayed swallow initiation-vallecula;Penetration/Apiration after swallow Pharyngeal Material enters airway, remains ABOVE vocal cords then ejected out Pharyngeal- Nectar Straw -- Pharyngeal -- Pharyngeal- Thin Teaspoon -- Pharyngeal -- Pharyngeal- Thin Cup Moderate aspiration;Delayed swallow initiation-pyriform sinuses Pharyngeal -- Pharyngeal- Thin Straw -- Pharyngeal -- Pharyngeal- Puree Delayed swallow initiation-vallecula;Pharyngeal residue - valleculae Pharyngeal -- Pharyngeal- Mechanical Soft -- Pharyngeal -- Pharyngeal- Regular Delayed swallow initiation-vallecula;Pharyngeal residue - valleculae Pharyngeal -- Pharyngeal- Multi-consistency  -- Pharyngeal -- Pharyngeal- Pill -- Pharyngeal -- Pharyngeal Comment --  CHL IP CERVICAL ESOPHAGEAL PHASE 05/13/2016 Cervical Esophageal Phase WFL Pudding Teaspoon -- Pudding Cup -- Honey Teaspoon -- Honey Cup -- Nectar Teaspoon -- Nectar Cup --  Nectar Straw -- Thin Teaspoon -- Thin Cup -- Thin Straw -- Puree -- Mechanical Soft -- Regular -- Multi-consistency -- Pill -- Cervical Esophageal Comment -- No flowsheet data found. Vinetta Bergamo MA, CCC-SLP 05/13/2016, 3:00 PM               Recent Labs  05/13/16 0858  WBC 13.4*  HGB 13.3  HCT 39.1  PLT 248    Recent Labs  05/13/16 0858  NA 131*  K 3.4*  CL 94*  GLUCOSE 143*  BUN 13  CREATININE 0.78  CALCIUM 8.6*   CBG (last 3)  No results for input(s): GLUCAP in the last 72 hours.  Wt Readings from Last 3 Encounters:  05/12/16 95.8 kg (211 lb 4.8 oz)  05/11/16 90.7 kg (200 lb)    Physical Exam:  BP 138/77 (BP Location: Left Arm)   Pulse 74   Temp 99 F (37.2 C) (Oral)   Resp 17   Ht 5\' 11"  (1.803 m)   Wt 95.8 kg (211 lb 4.8 oz)   SpO2 95%   BMI 29.47 kg/m  Constitutional: He appears well-developedand well-nourished. NAD.  Head: Old healed incision on scalp Eyes: Conjunctivaeand EOMare normal.  Cardiovascular: Normal rateand regular rhythm.  Respiratory: Breath sounds normal. No stridor. No respiratory distress. He has no wheezes.  GI: Soft. Bowel sounds are normal. He exhibits no distension. There is no tenderness.  Musculoskeletal: He exhibits no edemaor tenderness.  Ecchymosis LUE.  Neurological: He is alert.  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  Able to follow simple one step motor commands at times, with visual cues.  Motor (Limited by aphasia, but grossly): RUE: ?2+/5 LUE 4+/5 RLE: 1+/5 LLE: 4+/5 Skin: Skin is warmand dry. Ecchymosis left posterior neck, LUE, left flank and left hip.   Assessment/Plan: 1. Functional deficits secondary to left intraparenchymal hemorrhage,  subarachnoid hemorrhage, subdural hemorrhage which require 3+ hours per day of interdisciplinary therapy in a comprehensive inpatient rehab setting. Physiatrist is providing close team supervision and 24 hour management of active medical problems listed below. Physiatrist and rehab team continue to assess barriers to discharge/monitor patient progress toward functional and medical goals.  Function:  Bathing Bathing position   Position: Shower  Bathing parts Body parts bathed by patient: Right arm, Chest, Abdomen, Front perineal area, Right upper leg, Left upper leg Body parts bathed by helper: Right lower leg, Left lower leg, Buttocks, Left arm  Bathing assist Assist Level:  (Mod A)      Upper Body Dressing/Undressing Upper body dressing   What is the patient wearing?: Pull over shirt/dress     Pull over shirt/dress - Perfomed by patient: Put head through opening Pull over shirt/dress - Perfomed by helper: Thread/unthread right sleeve, Thread/unthread left sleeve, Pull shirt over trunk, Put head through opening        Upper body assist Assist Level:  (Total A)      Lower Body Dressing/Undressing Lower body dressing   What is the patient wearing?: Pants, Non-skid slipper socks       Pants- Performed by helper: Thread/unthread right pants leg, Thread/unthread left pants leg, Pull pants up/down   Non-skid slipper socks- Performed by helper: Don/doff right sock, Don/doff left sock                  Lower body assist Assist for lower body dressing:  (Total A)      Toileting Toileting Toileting activity did not occur: Safety/medical concerns   Toileting  steps completed by helper: Adjust clothing prior to toileting, Performs perineal hygiene, Adjust clothing after toileting Toileting Assistive Devices: Grab bar or rail  Toileting assist Assist level:  (Total A)   Transfers Chair/bed transfer Chair/bed transfer activity did not occur: N/A Chair/bed transfer method:  Stand pivot Chair/bed transfer assist level: Maximal assist (Pt 25 - 49%/lift and lower) Chair/bed transfer assistive device: Armrests     Locomotion Ambulation     Max distance: 20 ft Assist level: 2 helpers   Wheelchair   Type: Manual Max wheelchair distance: 15 ft Assist Level: Total assistance (Pt < 25%)  Cognition Comprehension Comprehension assist level: Understands basic 25 - 49% of the time/ requires cueing 50 - 75% of the time  Expression Expression assist level: Expresses basic 25 - 49% of the time/requires cueing 50 - 75% of the time. Uses single words/gestures.  Social Interaction Social Interaction assist level: Interacts appropriately less than 25% of the time. May be withdrawn or combative.  Problem Solving Problem solving assist level: Solves basic less than 25% of the time - needs direction nearly all the time or does not effectively solve problems and may need a restraint for safety  Memory Memory assist level: Recognizes or recalls less than 25% of the time/requires cueing greater than 75% of the time     Medical Problem List and Plan: 1. Right hemiparesis, aphasia, cognitive dysfunction, gait disorder secondary to left intraparenchymal hemorrhage, subarachnoid hemorrhage, subdural hemorrhage following a fall 05/04/2016  Cont CIR  Repeat Head CT reviewed, spoke with Radiologist, appears to be significant, but stable bleed 2. DVT Prophylaxis/Anticoagulation: Pharmaceutical: Lovenox 3. Pain Management: Hydrocodone PRN.  4. Mood: Monitor for visual and behavioral changes. History of depression 5. Neuropsych: This patient is not capable of making decisions on hisown behalf. 6. Skin/Wound Care: Routine pressure relief measures 7. Fluids/Electrolytes/Nutrition: Monitor I/O. Appetite reported to be good.  8. PAF?/HTN: Monitor blood pressure and heart rate bid. Continue digoxin and Toprol XL.  9. Seizure disorder: On keppra bid.  10. E coli UTI: Treated.   Ua with  rare bacteria, Ucx negative. 11. Urinary retention: Monitor voiding  Will hold off on bethanechol until cognition stabalizes   Toilet every 3-4 hours.  12. History of gout. Monitor for flareup 13. Review of primary care notes indicate has CODE STATUS: DO NOT RESUSCITATE, this was discussed with his wife, she will bring no code order from primary care physician.  14. HTN  Monitor with increased activity  Will adjust meds if necessary 15. Hypokalemia  3.4 on 10/20  Supplemented  Will cont to monitor, labs ordered for tomorrow 16. Hyponatremia  Na+ 131 on 10/20   Will cont to monitor, labs ordered for tomorrow 17. Leukocytosis: Afebrile  WBCs 13.4 on 10/20  Urine neg, CXR 10/21 reviewed, negative for infection  Labs ordered for tomorrow  LOS (Days) 3 A FACE TO FACE EVALUATION WAS PERFORMED  Alayna Mabe Lorie Phenix 05/15/2016 8:53 AM

## 2016-05-15 NOTE — Progress Notes (Signed)
Physical Therapy Session Note  Patient Details  Name: James [name] MRN: [id number] Date of Birth: [birth year]  Today's Date: 05/15/2016  PT Individual Time: 0900-1000 [id number] PT Individual Time Calculation (min): 60 min and 40 min    Short Term Goals: Week 1:  PT Short Term Goal 1 (Week 1): Patient will perform bed mobility with consistent min A using rails.  PT Short Term Goal 2 (Week 1): Patient will transfer with consistent mod A.  PT Short Term Goal 3 (Week 1): Patient will ambulate 50 ft using LRAD with mod A.  PT Short Term Goal 4 (Week 1): Patient will maintain dynamic standing balance x 2 min with max A.  PT Short Term Goal 5 (Week 1): Patient will initiate wheelchair mobility.  Skilled Therapeutic Interventions/Progress Updates:    Treatment 1: Patient awake and alert in bed upon arrival. Transferred to edge of bed using rail with min A for UB/LB dressing sitting EOB with supervision-min A for dynamic sitting balance. Performed stand pivot transfer bed > wheelchair <> mat table with mod A. Patient propelled wheelchair using LUE to therapy gym with min-mod A for steering and propulsion. Patient performed sit <> stand transfers with min A overall. Gait training using RW with mod faded to max A x 45 ft with manual facilitation for weight shifting and advancing RLE and patient demonstrating progressive forward lean with no awareness of RLE drag and assist to keep RW close to body. Patient performed NuStep using BLE only at level 3 x 10 min for RLE neuro re-ed, reciprocal pattern re-training, and increasing attention to R LE. Patient unable to visually scan or fix gaze on RLE to increase attention despite cues. Switched out wheelchair for increased back support due to heavy posterior pelvic tilt/posterior lean in sitting for improved sitting tolerance and upright posture. Seated EOM, performed functional reaching tasks with focus on gross RUE grasp/release and lateral trunk shortening to R  and lateral trunk elongation to L with LUE reach with supervision. Patient left sitting in wheelchair with quick release belt donned and call bell in lap, RN notified of wet vocal quality and multiple attempts at throat clear throughout session.    Treatment 2: Session focused on functional mobility, attention to R side of body, visual scanning, standing balance, RUE/RLE neuro re-ed. Patient resting in bed upon arrival, easily awakened. Transferred supine > sit using rail with min A to bring RLE off bed and threaded LLE only and attempted to stand to pull up pants, requiring total A cues to attend to RLE. Total A to thread BLE sitting EOB and assist to completely pull up pants in standing with min A. Patient performed stand pivot transfers throughout session with mod-max A and heavy dependence on LUE with max multimodal cues to step with RLE. Patient propelled wheelchair on rehab unit using LUE only with mod A overall. Stair training up/down 4 (6") stairs using L rail with step-to pattern, max A to advance/place RLE but patient able to stabilize with minimal buckling RLE. In standing, reaching for object with LUE at midline and weight shifting up and to L to place to target with max multimodal cues and min-max A for dynamic standing balance. Seated EOM, patient instructed in gross grasp/release using RUE requiring max-total multimodal cues and increased time to complete task. Patient more impulsive throughout session, attempting to transfer unassisted from unlocked wheelchair with leg rests donned multiple times requiring max-total A for safety awareness. Patient returned to bed at end of session,  left semi reclined with 4 rails up per family request, bed alarm on, and soft call bell in reach.   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, impaired vision, R sided weakness Restrictions Weight Bearing Restrictions: No   Pain: Pain Assessment Pain Assessment: Faces Faces Pain Scale: No hurt   See Function Navigator for Current Functional Status.   Therapy/Group: Individual Therapy  Laretta Alstrom 05/15/2016, 10:26 AM

## 2016-05-16 ENCOUNTER — Inpatient Hospital Stay (HOSPITAL_COMMUNITY): Payer: Medicare Other | Admitting: Speech Pathology

## 2016-05-16 ENCOUNTER — Inpatient Hospital Stay (HOSPITAL_COMMUNITY): Payer: Medicare HMO | Admitting: Occupational Therapy

## 2016-05-16 ENCOUNTER — Inpatient Hospital Stay (HOSPITAL_COMMUNITY): Payer: Medicare Other | Admitting: Occupational Therapy

## 2016-05-16 ENCOUNTER — Inpatient Hospital Stay (HOSPITAL_COMMUNITY): Payer: Medicare Other | Admitting: Physical Therapy

## 2016-05-16 LAB — CBC WITH DIFFERENTIAL/PLATELET
Basophils Absolute: 0 10*3/uL (ref 0.0–0.1)
Basophils Relative: 0 %
EOS ABS: 0.3 10*3/uL (ref 0.0–0.7)
Eosinophils Relative: 1 %
HCT: 42.4 % (ref 39.0–52.0)
HEMOGLOBIN: 14.2 g/dL (ref 13.0–17.0)
LYMPHS ABS: 3 10*3/uL (ref 0.7–4.0)
LYMPHS PCT: 16 %
MCH: 28.5 pg (ref 26.0–34.0)
MCHC: 33.5 g/dL (ref 30.0–36.0)
MCV: 85.1 fL (ref 78.0–100.0)
MONOS PCT: 5 %
Monocytes Absolute: 1 10*3/uL (ref 0.1–1.0)
NEUTROS PCT: 78 %
Neutro Abs: 14.7 10*3/uL — ABNORMAL HIGH (ref 1.7–7.7)
Platelets: 323 10*3/uL (ref 150–400)
RBC: 4.98 MIL/uL (ref 4.22–5.81)
RDW: 13.2 % (ref 11.5–15.5)
WBC: 19 10*3/uL — AB (ref 4.0–10.5)

## 2016-05-16 LAB — BASIC METABOLIC PANEL
Anion gap: 9 (ref 5–15)
BUN: 17 mg/dL (ref 6–20)
CHLORIDE: 97 mmol/L — AB (ref 101–111)
CO2: 29 mmol/L (ref 22–32)
CREATININE: 0.82 mg/dL (ref 0.61–1.24)
Calcium: 9 mg/dL (ref 8.9–10.3)
GFR calc non Af Amer: >60 mL/min (ref >60)
Glucose, Bld: 119 mg/dL — ABNORMAL HIGH (ref 65–99)
POTASSIUM: 3.9 mmol/L (ref 3.5–5.1)
SODIUM: 135 mmol/L (ref 135–145)

## 2016-05-16 NOTE — Progress Notes (Signed)
[facility] PHYS[facility]ICINE & REHABILITATION     PROGRESS NOTE  Subjective/Complaints:  Pt laying in bed this AM, eating breakfast.  Inconsistent responses.   ROS: Unable to assess due to language.  Objective: Vital Signs: Blood pressure 124/76, pulse 77, temperature 98.4 F (36.9 C), temperature source Oral, resp. rate 18, height 5\' 11"  (1.803 m), weight 95.8 kg (211 lb 4.8 oz), SpO2 98 %. Dg Chest 2 View  Result Date: 05/14/2016 CLINICAL DATA:  75 year old male with history of leukocytosis. History of atrial fibrillation. Hypertension. EXAM: CHEST  2 VIEW COMPARISON:  Chest x-ray 04/20/2011. FINDINGS: Lung volumes are low. No consolidative airspace disease. No definite pleural effusions. Ill-defined bibasilar opacities, favored to reflect areas of subsegmental atelectasis given the low lung volumes. No evidence of pulmonary edema. Heart size is mildly enlarged. Upper mediastinal contours are within normal limits. Aortic atherosclerosis. IMPRESSION: 1. Low lung volumes with bibasilar subsegmental atelectasis. 2. Aortic atherosclerosis. 3. Mild cardiomegaly. Electronically Signed   By: Vinnie Langton M.D.   On: 05/14/2016 13:42   No results for input(s): WBC, HGB, HCT, PLT in the last 72 hours. No results for input(s): NA, K, CL, GLUCOSE, BUN, CREATININE, CALCIUM in the last 72 hours.  Invalid input(s): CO CBG (last 3)  No results for input(s): GLUCAP in the last 72 hours.  Wt Readings from Last 3 Encounters:  05/12/16 95.8 kg (211 lb 4.8 oz)  05/11/16 90.7 kg (200 lb)    Physical Exam:  BP 124/76   Pulse 77 Comment: apical  Temp 98.4 F (36.9 C) (Oral)   Resp 18   Ht 5\' 11"  (1.803 m)   Wt 95.8 kg (211 lb 4.8 oz)   SpO2 98%   BMI 29.47 kg/m  Constitutional: He appears well-developedand well-nourished. NAD.  Head: Old healed incision on scalp Eyes: Conjunctivaeand EOMare normal.  Cardiovascular: Normal rateand regular rhythm.  Respiratory: Breath sounds normal. No  stridor. No respiratory distress. He has no wheezes.  GI: Soft. Bowel sounds are normal. He exhibits no distension. There is no tenderness.  Musculoskeletal: He exhibits no edemaor tenderness.  Ecchymosis LUE.  Neurological: He is alert.  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  Able to follow simple one step motor commands at times, with visual cues.  Motor (Limited by aphasia, but grossly): RUE: ?2+/5 LUE 4+/5 RLE: 1+/5 LLE: 4+/5 Skin: Skin is warmand dry. Ecchymosis left posterior neck, LUE, left flank and left hip.   Assessment/Plan: 1. Functional deficits secondary to left intraparenchymal hemorrhage, subarachnoid hemorrhage, subdural hemorrhage which require 3+ hours per day of interdisciplinary therapy in a comprehensive inpatient rehab setting. Physiatrist is providing close team supervision and 24 hour management of active medical problems listed below. Physiatrist and rehab team continue to assess barriers to discharge/monitor patient progress toward functional and medical goals.  Function:  Bathing Bathing position   Position: Shower  Bathing parts Body parts bathed by patient: Right arm, Chest, Abdomen, Front perineal area, Right upper leg, Left upper leg Body parts bathed by helper: Right lower leg, Left lower leg, Buttocks, Left arm  Bathing assist Assist Level:  (Mod A)      Upper Body Dressing/Undressing Upper body dressing   What is the patient wearing?: Pull over shirt/dress     Pull over shirt/dress - Perfomed by patient: Thread/unthread left sleeve, Put head through opening Pull over shirt/dress - Perfomed by helper: Thread/unthread right sleeve, Pull shirt over trunk        Upper body assist Assist Level:  Touching or steadying assistance(Pt > 75%)      Lower Body Dressing/Undressing Lower body dressing   What is the patient wearing?: Pants     Pants- Performed by patient: Pull pants up/down Pants- Performed by helper:  Thread/unthread right pants leg, Thread/unthread left pants leg   Non-skid slipper socks- Performed by helper: Don/doff right sock, Don/doff left sock                  Lower body assist Assist for lower body dressing: Touching or steadying assistance (Pt > 75%)      Toileting Toileting Toileting activity did not occur: Safety/medical concerns   Toileting steps completed by helper: Adjust clothing prior to toileting, Performs perineal hygiene, Adjust clothing after toileting Toileting Assistive Devices: Grab bar or rail  Toileting assist Assist level: Touching or steadying assistance (Pt.75%)   Transfers Chair/bed transfer Chair/bed transfer activity did not occur: N/A Chair/bed transfer method: Squat pivot Chair/bed transfer assist level: Moderate assist (Pt 50 - 74%/lift or lower) Chair/bed transfer assistive device: Armrests     Locomotion Ambulation     Max distance: 45 ft Assist level: Maximal assist (Pt 25 - 49%)   Wheelchair   Type: Manual Max wheelchair distance: 200 ft Assist Level: Moderate assistance (Pt 50 - 74%)  Cognition Comprehension Comprehension assist level: Understands basic 25 - 49% of the time/ requires cueing 50 - 75% of the time  Expression Expression assist level: Expresses basis less than 25% of the time/requires cueing >75% of the time.  Social Interaction Social Interaction assist level: Interacts appropriately 25 - 49% of time - Needs frequent redirection.  Problem Solving Problem solving assist level: Solves basic less than 25% of the time - needs direction nearly all the time or does not effectively solve problems and may need a restraint for safety  Memory Memory assist level: Recognizes or recalls less than 25% of the time/requires cueing greater than 75% of the time     Medical Problem List and Plan: 1. Right hemiparesis, aphasia, cognitive dysfunction, gait disorder secondary to left intraparenchymal hemorrhage, subarachnoid  hemorrhage, subdural hemorrhage following a fall 05/04/2016  Cont CIR  Repeat Head CT reviewed, spoke with Radiologist, appears to be significant, but stable bleed 2. DVT Prophylaxis/Anticoagulation: Pharmaceutical: Lovenox 3. Pain Management: Hydrocodone PRN.  4. Mood: Monitor for visual and behavioral changes. History of depression 5. Neuropsych: This patient is not capable of making decisions on hisown behalf. 6. Skin/Wound Care: Routine pressure relief measures 7. Fluids/Electrolytes/Nutrition: Monitor I/O. Appetite reported to be good.  8. PAF?/HTN: Monitor blood pressure and heart rate bid. Continue digoxin and Toprol XL.  9. Seizure disorder: On keppra bid.  10. E coli UTI: Treated.   Repeat Ua with rare bacteria, Ucx negative. 11. Urinary retention: Monitor voiding  Will hold off on bethanechol until cognition stabalizes   Toilet every 3-4 hours.  12. History of gout. Monitor for flareup 13. Review of primary care notes indicate has CODE STATUS: DO NOT RESUSCITATE, this was discussed with his wife, she will bring no code order from primary care physician.  14. HTN  Monitor with increased activity  Will adjust meds if necessary 15. Hypokalemia  3.4 on 10/20  Supplemented  Labs pending 16. Hyponatremia  Na+ 131 on 10/20   Labs pending 17. Leukocytosis: Afebrile  WBCs 13.4 on 10/20  Urine neg, CXR 10/21 reviewed, negative for infection  Labs pending  LOS (Days) 4 A FACE TO FACE EVALUATION WAS PERFORMED  Ayan Yankey Lorie Phenix  05/16/2016 9:17 AM

## 2016-05-16 NOTE — Progress Notes (Signed)
Physical Therapy Session Note  Patient Details  Name: James [name] MRN: [id number] Date of Birth: [birth year]  Today's Date: 05/16/2016 PT Individual Time: 1300-1430 PT Individual Time Calculation (min): 90 min   Short Term Goals: Week 1:  PT Short Term Goal 1 (Week 1): Patient will perform bed mobility with consistent min A using rails.  PT Short Term Goal 2 (Week 1): Patient will transfer with consistent mod A.  PT Short Term Goal 3 (Week 1): Patient will ambulate 50 ft using LRAD with mod A.  PT Short Term Goal 4 (Week 1): Patient will maintain dynamic standing balance x 2 min with max A.  PT Short Term Goal 5 (Week 1): Patient will initiate wheelchair mobility.  Skilled Therapeutic Interventions/Progress Updates:    Session focused on R attention, R neuro re-ed, functional mobility, standing balance, visual scanning, command following, and activity tolerance. Patient resting in bed with wife present but departing at beginning of session. Patient sat edge of bed with supervision-steady assist using rails and donned shoes via crossing legs with patient demonstrating good initiation but requiring assist to complete task due to tight shoes. Performed squat pivot transfer with mod A to wheelchair with patient demonstrating R body neglect despite max multimodal cues for safe positioning of RLE. Patient propelled wheelchair using LUE only x 150 ft with min-mod A for steering. Sit <> stand transfer training from wheelchair using RW with supervision-min A with max multimodal cues and no carryover noted for pushing up/reaching back for arm rest. Gait training using RW x 35 ft + 15 ft with patie nt demonstrating improved advancement/placement RLE but requiring min-mod A for manual facilitation of weight shifting and keeping RW close to body and max verbal/tactile cues for reciprocal gait pattern (full R step and increased L step length) with good carryover noted. Stair training up/down 8 (6") stairs using 2  rails with 1 seated rest break, min A ascending and mod A descending with max multimodal cues for safe step-to pattern and HOH assist to advance RUE along rail. Engaged in tall kneeling on mat table with small bench for BUE weight bearing with supervision-min A with functional reach outside BOS to L and across midline to R using LUE with RUE WB on bench and max multimodal cues for upright posture and hip extension to neutral. Seated in wheelchair, worked on manipulating cup with gross grasp/release for active rest break. From seated position, engaged in [place] using BLE at 0.2 cm/sec workload, x 4 min + 2 min + 1 min trials with rest breaks between until patient fatigued. Returned to room, performed stand pivot transfer back to bed with min A, sit > supine with min A to lift RLE on bed, and patient assisted with repositioning in bed and left semi reclined with 4 rails up per family request and bed alarm on, soft call bell in reach.   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   Therapy/Group: Individual Therapy  Laretta Alstrom 05/16/2016, 2:43 PM

## 2016-05-16 NOTE — Progress Notes (Signed)
Occupational Therapy Session Note  Patient Details  Name: James [name] MRN: [id number] Date of Birth: 11-10-1940  Today's Date: 05/16/2016  Session 1 OT Individual Time: 0800-0900 OT Individual Time Calculation (min): 60 min   Session 2 OT Individual Time: 7619-5093 OT Individual Time Calculation (min): 35 min    Short Term Goals: Week 1:  OT Short Term Goal 1 (Week 1): Pt will appropriately use self-care objects with mod instructional cues. OT Short Term Goal 2 (Week 1): Pt will don shirt using hemi-techniques and mod A OT Short Term Goal 3 (Week 1): Pt will complete shower transfer with Mod A OT Short Term Goal 4 (Week 1): Pt will attend to 3 minute ADL task in minimally distractive environment with min cues  Skilled Therapeutic Interventions/Progress Updates:    Session 1 1:1 OT session focused on modified bathing/dressing, sit<>stands, functional transfers, and functional use of R Ue. Pt alert this morning and followed one-step commands 75% of the time today. He completed stand-pivot transfers with min/Mod A with VC for safety. Incorporated forced use of R UE during bathing/dressing tasks. Pt able to grasp wash cloth with R UE and wash 75% of L UE . Pt required Mod A and Max A to maintain dynamic standing balance when pulling pants over hips 2/2 R knee buckle. Pt impulsive to stand during dressing tasks requiring Mod cues to stand safely. Pt left seated in w/c at end of session with needs met and safety belt on.   Session 2 1:1 OT session focused on stand-pivot transfers, functional use of R UE , and attention to R side. Pt completed stand-pivot transfers with Min/Mod A. Utilized [employer] for R side attention and use of R UE . Pt w/ difficulty following commands to use R UE only, requiring OT to hold L UE to encourage only use or R. Incorporated finger isolation and shoulder ROM with task. Pt required 75% verbal cues initially to attend to R side of board, but with repetition, pt  required 40% cues to scan to R. Pt returned to room at end of session and left in bed with needs met and bed alarm on.   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  Therapy/Group: Individual Therapy  Valma Cava 05/16/2016, 3:53 PM

## 2016-05-16 NOTE — Progress Notes (Signed)
Occupational Therapy Session Note  Patient Details  Name: James [name] MRN: [id number] Date of Birth: [birth year]  Today's Date: 05/16/2016 OT Individual Time: 0930-1000 OT Individual Time Calculation (min): 30 min     Short Term Goals: Week 1:  OT Short Term Goal 1 (Week 1): Pt will appropriately use self-care objects with mod instructional cues. OT Short Term Goal 2 (Week 1): Pt will don shirt using hemi-techniques and mod A OT Short Term Goal 3 (Week 1): Pt will complete shower transfer with Mod A OT Short Term Goal 4 (Week 1): Pt will attend to 3 minute ADL task in minimally distractive environment with min cues  Skilled Therapeutic Interventions/Progress Updates:   Patient participated in skilled OT services this session as follows: For oral care activity:     - Patient initially opened the toothpaste and proceeded to put the paste on his lips rather than the tooth brush.        With moderate hand over hand assist he was able to redirect and apply paste to the toothbrush      -      Patient was able to attend to attention to the brushing task for approximately 6 minutes today   (though he did require hand over hand assist to incorporate using his right hand  As assist and to squeeze out the paste     _    Though for brushing his hair, he initially used his left hand and only brush the left side of his head, with Moderate hand over right hand assist he was able to brush the right side and his head and cross midline to brush the hair on the left side of his head     _ patient was able to complete sit to stand with Min A x 2 at the sink , and he was able to stand weight bearing through bilateral lower extremities and with bilateral upper extremities with Min A to stabilize right leg and right hand  For approximately 5 minutes per try.  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   Therapy/Group: Individual Therapy  Alfredia Ferguson [facility] 05/16/2016, 11:58 AM

## 2016-05-16 NOTE — Progress Notes (Signed)
Speech Language Pathology Daily Session Note  Patient Details  Name: James [name] MRN: [id number] Date of Birth: April 09, 1941  Today's Date: 05/16/2016 SLP Individual Time: 1120-1205 SLP Individual Time Calculation (min): 45 min   Short Term Goals: Week 1: SLP Short Term Goal 1 (Week 1): Pt to identify objects in a field of 2 with min A.  SLP Short Term Goal 2 (Week 1): Pt communicate wants/needs via any means with max A SLP Short Term Goal 3 (Week 1): Pt answer  basic Y/N questions with 70% acc and mod A SLP Short Term Goal 4 (Week 1): Pt to demonstrate oral clearance with trials of Dys 2 with mod A SLP Short Term Goal 5 (Week 1): Pt to clear oral cavity of thin liquid boluses within 2 seconds with min verbal cueing  Skilled Therapeutic Interventions: Pt was seen for skilled ST targeting goals for dysphagia and communication.  Session began late due to nursing care, pt requiring in and out cath due to urinary retention. Per RN, discussed with pt's wife that pt was essentially nonverbal due to previous TBI with fair receptive language skills.  Pt was able to follow 1 step commands and answer immediate, basic yes/no questions with min-mod assist demonstration cues.  Verbal output was characterized by perseverative jargon.  During presentations of his currently prescribed diet during lunch today pt required max assist multimodal cues for rate and portion control due to impulsivity which resulting in pt consistently taking consecutive bites and sips of boluses before effectively clearing materials from the oral cavity or initiating primary swallow response.  Suspect attention to task and awareness of deficits to be limiting function.  Pt was left in bed at the end of today's therapy session with bed alarm set and call bell within reach.  Continue per current plan of care.     Function:  Eating Eating   Modified Consistency Diet: Yes Eating Assist Level: Helper checks for pocketed food;Supervision  or verbal cues;Set up assist for;Help managing cup/glass;Help with picking up utensils   Eating Set Up Assist For: Opening containers       Cognition Comprehension Comprehension assist level: Understands basic 50 - 74% of the time/ requires cueing 25 - 49% of the time  Expression   Expression assist level: Expresses basis less than 25% of the time/requires cueing >75% of the time.  Social Interaction Social Interaction assist level: Interacts appropriately 25 - 49% of time - Needs frequent redirection.  Problem Solving Problem solving assist level: Solves basic less than 25% of the time - needs direction nearly all the time or does not effectively solve problems and may need a restraint for safety  Memory Memory assist level: Recognizes or recalls less than 25% of the time/requires cueing greater than 75% of the time    Pain Pain Assessment Pain Assessment: No/denies pain  Therapy/Group: Individual Therapy  Alizey Noren, Selinda Orion 05/16/2016, 12:52 PM

## 2016-05-17 ENCOUNTER — Inpatient Hospital Stay (HOSPITAL_COMMUNITY): Payer: Medicare Other | Admitting: Occupational Therapy

## 2016-05-17 ENCOUNTER — Inpatient Hospital Stay (HOSPITAL_COMMUNITY): Payer: Medicare HMO | Admitting: Occupational Therapy

## 2016-05-17 ENCOUNTER — Inpatient Hospital Stay (HOSPITAL_COMMUNITY): Payer: Medicare Other | Admitting: Physical Therapy

## 2016-05-17 ENCOUNTER — Inpatient Hospital Stay (HOSPITAL_COMMUNITY): Payer: Medicare Other | Admitting: Speech Pathology

## 2016-05-17 LAB — URINALYSIS, ROUTINE W REFLEX MICROSCOPIC
BILIRUBIN URINE: NEGATIVE
GLUCOSE, UA: NEGATIVE mg/dL
HGB URINE DIPSTICK: NEGATIVE
Ketones, ur: NEGATIVE mg/dL
Nitrite: NEGATIVE
PROTEIN: NEGATIVE mg/dL
Specific Gravity, Urine: 1.022 (ref 1.005–1.030)
pH: 6.5 (ref 5.0–8.0)

## 2016-05-17 LAB — URINE MICROSCOPIC-ADD ON: RBC / HPF: NONE SEEN RBC/hpf (ref 0–5)

## 2016-05-17 MED ORDER — ENOXAPARIN SODIUM 40 MG/0.4ML ~~LOC~~ SOLN
40.0000 mg | SUBCUTANEOUS | Status: DC
  Administered 2016-05-17 – 2016-05-27 (×11): 40 mg via SUBCUTANEOUS
  Filled 2016-05-17 (×11): qty 0.4

## 2016-05-17 MED ORDER — BETHANECHOL CHLORIDE 10 MG PO TABS
5.0000 mg | ORAL_TABLET | Freq: Three times a day (TID) | ORAL | Status: DC
  Administered 2016-05-17 – 2016-05-23 (×19): 5 mg via ORAL
  Filled 2016-05-17 (×18): qty 1

## 2016-05-17 NOTE — Progress Notes (Signed)
Occupational Therapy Session Note  Patient Details  Name: James [name] MRN: [id number] Date of Birth: 02-27-1941  Today's Date: 05/17/2016 OT Individual Time: [id number] OT Individual Time Calculation (min): 25 min  and Today's Date: 05/17/2016 OT Missed Time: 35 Minutes Missed Time Reason: Patient fatigue     Short Term Goals: Week 1:  OT Short Term Goal 1 (Week 1): Pt will appropriately use self-care objects with mod instructional cues. OT Short Term Goal 2 (Week 1): Pt will don shirt using hemi-techniques and mod A OT Short Term Goal 3 (Week 1): Pt will complete shower transfer with Mod A OT Short Term Goal 4 (Week 1): Pt will attend to 3 minute ADL task in minimally distractive environment with min cues  Skilled Therapeutic Interventions/Progress Updates:    Upon entering the room, pt supine in bed sleeping. Pt having difficulty staying alert this session. OT placing cold wash cloth into R hand in order to force use to wash face with hand over hand assist. Pt then taking L hand as well and cleaning more thoroughly. Pt washing hands as well with set up A. OT assisted pt with management of cup in order for pt to drink thickened liquids. OT physically pulling cup from pt in order to ensure that pt takes small sips only. After drinking, OT attempts to continue interventions but pt begins to sleep again. OT starts other tasks with pt and he continues to fall asleep and unable to attend to task. Pt missed OT intervention secondary to fatigue.   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   Therapy/Group: Individual Therapy  Phineas Semen 05/17/2016, 7:50 PM

## 2016-05-17 NOTE — Progress Notes (Signed)
[facility] PH[facility]ICAL [facility]IC[facility] & REHABILITATION     PROGRESS NOTE  Subjective/Complaints:  Pt laying in bed this AM. Per report, slept well overnight.  His aphasia appears to be slightly improving.  ROS: Unable to assess due to language.  Objective: Vital Signs: Blood pressure (!) 158/84, pulse 74, temperature 98.2 F (36.8 C), temperature source Oral, resp. rate 18, height 5\' 11"  (1.803 m), weight 95.8 kg (211 lb 4.8 oz), SpO2 100 %. No results found.  Recent Labs  05/16/16 0857  WBC 19.0*  HGB 14.2  HCT 42.4  PLT 323    Recent Labs  05/16/16 0857  NA 135  K 3.9  CL 97*  GLUCOSE 119*  BUN 17  CREATININE 0.82  CALCIUM 9.0   CBG (last 3)  No results for input(s): GLUCAP in the last 72 hours.  Wt Readings from Last 3 Encounters:  05/12/16 95.8 kg (211 lb 4.8 oz)  05/11/16 90.7 kg (200 lb)    Physical Exam:  BP (!) 158/84 (BP Location: Right Arm)   Pulse 74   Temp 98.2 F (36.8 C) (Oral)   Resp 18   Ht 5\' 11"  (1.803 m)   Wt 95.8 kg (211 lb 4.8 oz)   SpO2 100%   BMI 29.47 kg/m  Constitutional: He appears well-developedand well-nourished. NAD.  Head: Old healed incision on scalp Eyes: Conjunctivaeand EOMare normal.  Cardiovascular: Normal rateand regular rhythm.  Respiratory: Breath sounds normal. No stridor. No respiratory distress. He has no wheezes.  GI: Soft. Bowel sounds are normal. He exhibits no distension. There is no tenderness.  Musculoskeletal: He exhibits no edemaor tenderness.  Ecchymosis LUE.  Neurological: He is alert.  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  Able to follow simple one step motor commands at times, with visual cues.  Motor (Limited by aphasia, but grossly): RUE: ?3/5 LUE 4+/5 RLE: 1+/5 LLE: 4+/5 Skin: Skin is warmand dry. Ecchymosis left posterior neck, LUE, left flank and left hip, slowly improving.   Assessment/Plan: 1. Functional deficits secondary to left intraparenchymal  hemorrhage, subarachnoid hemorrhage, subdural hemorrhage which require 3+ hours per day of interdisciplinary therapy in a comprehensive inpatient rehab setting. Physiatrist is providing close team supervision and 24 hour management of active medical problems listed below. Physiatrist and rehab team continue to assess barriers to discharge/monitor patient progress toward functional and medical goals.  Function:  Bathing Bathing position   Position: Shower  Bathing parts Body parts bathed by patient: Right arm, Chest, Abdomen, Front perineal area, Right upper leg, Left upper leg, Left lower leg Body parts bathed by helper: Right lower leg, Buttocks, Left arm  Bathing assist Assist Level: Touching or steadying assistance(Pt > 75%)      Upper Body Dressing/Undressing Upper body dressing   What is the patient wearing?: Pull over shirt/dress     Pull over shirt/dress - Perfomed by patient: Thread/unthread left sleeve, Put head through opening Pull over shirt/dress - Perfomed by helper: Thread/unthread right sleeve, Pull shirt over trunk        Upper body assist Assist Level: Touching or steadying assistance(Pt > 75%)      Lower Body Dressing/Undressing Lower body dressing   What is the patient wearing?: Pants     Pants- Performed by patient: Pull pants up/down Pants- Performed by helper: Thread/unthread left pants leg, Thread/unthread right pants leg   Non-skid slipper socks- Performed by helper: Don/doff left sock, Don/doff right sock  Lower body assist Assist for lower body dressing: Touching or steadying assistance (Pt > 75%)      Toileting Toileting     Toileting steps completed by helper: Adjust clothing prior to toileting, Performs perineal hygiene, Adjust clothing after toileting Toileting Assistive Devices: Grab bar or rail  Toileting assist Assist level: Touching or steadying assistance (Pt.75%)   Transfers Chair/bed transfer Chair/bed  transfer activity did not occur: N/A Chair/bed transfer method: Stand pivot, Squat pivot Chair/bed transfer assist level: Moderate assist (Pt 50 - 74%/lift or lower) Chair/bed transfer assistive device: Armrests     Locomotion Ambulation     Max distance: 35 ft Assist level: Moderate assist (Pt 50 - 74%)   Wheelchair   Type: Manual Max wheelchair distance: 200 ft Assist Level: Moderate assistance (Pt 50 - 74%)  Cognition Comprehension Comprehension assist level: Understands basic 25 - 49% of the time/ requires cueing 50 - 75% of the time (Simultaneous filing. User may not have seen previous data.)  Expression Expression assist level: Expresses basis less than 25% of the time/requires cueing >75% of the time. (Simultaneous filing. User may not have seen previous data.)  Social Interaction Social Interaction assist level: Interacts appropriately 25 - 49% of time - Needs frequent redirection. (Simultaneous filing. User may not have seen previous data.)  Problem Solving Problem solving assist level: Solves basic less than 25% of the time - needs direction nearly all the time or does not effectively solve problems and may need a restraint for safety (Simultaneous filing. User may not have seen previous data.)  Memory Memory assist level: Recognizes or recalls less than 25% of the time/requires cueing greater than 75% of the time     Medical Problem List and Plan: 1. Right hemiparesis, aphasia, cognitive dysfunction, gait disorder secondary to left intraparenchymal hemorrhage, subarachnoid hemorrhage, subdural hemorrhage following a fall 05/04/2016  Cont CIR  Repeat Head CT reviewed, spoke with Radiologist, appears to be significant, but stable bleed 2. DVT Prophylaxis/Anticoagulation: Pharmaceutical: Lovenox restarted 3. Pain Management: Hydrocodone PRN.  4. Mood: Monitor for visual and behavioral changes. History of depression 5. Neuropsych: This patient is not capable of making  decisions on hisown behalf. 6. Skin/Wound Care: Routine pressure relief measures 7. Fluids/Electrolytes/Nutrition: Monitor I/O. Appetite reported to be good.  8. PAF?/HTN: Monitor blood pressure and heart rate bid. Continue digoxin and Toprol XL.  9. Seizure disorder: On keppra bid.  10. E coli UTI: Treated.   Repeat Ua with rare bacteria, Ucx negative. 11. Urinary retention: Monitor voiding  Bethanechol 5 TID on 10/24  Toilet every 3-4 hours.  12. History of gout. Monitor for flareup 13. Review of primary care notes indicate has CODE STATUS: DO NOT RESUSCITATE, this was discussed with his wife, she will bring no code order from primary care physician.  14. HTN  Monitor with increased activity  Elevated last 24 hours, otherwise WNL, will cont to monitor 15. Hypokalemia  3.9 on 10/23  Supplemented 16. Hyponatremia  Na+ 135 on 10/23 17. Leukocytosis:   Afebrile  WBCs 19.0 on 10/23, trending  Urine neg, CXR 10/21 reviewed, negative for infection  Will discuss with team regarding other symptoms as unattainable from patient  Repeat UA/Ucx ordered    LOS (Days) 5 A FACE TO FACE EVALUATION WAS PERFORMED  Ankit Lorie Phenix 05/17/2016 9:47 AM

## 2016-05-17 NOTE — Progress Notes (Signed)
Speech Language Pathology Daily Session Note  Patient Details  Name: James [name] MRN: [id number] Date of Birth: [birth year]  Today's Date: 05/17/2016 SLP Individual Time: 1300-1350 SLP Individual Time Calculation (min): 50 min   Short Term Goals: Week 1: SLP Short Term Goal 1 (Week 1): Pt to identify objects in a field of 2 with min A.  SLP Short Term Goal 2 (Week 1): Pt communicate wants/needs via any means with max A SLP Short Term Goal 3 (Week 1): Pt answer  basic Y/N questions with 70% acc and mod A SLP Short Term Goal 4 (Week 1): Pt to demonstrate oral clearance with trials of Dys 2 with mod A SLP Short Term Goal 5 (Week 1): Pt to clear oral cavity of thin liquid boluses within 2 seconds with min verbal cueing  Skilled Therapeutic Interventions: Pt was seen for skilled ST targeting family education.  Pt very lethargic upon arrival and beginning to slide out of wheelchair despite quick release belt.  Pt needed max assist multimodal cues to maintain alertness for very brief periods of time.  Pt was able to indicate via head nods/shakes for yes/no responses that he wanted to get back into bed and was transferred from wheelchair to bed via Stedy lift and with assistance from RN.  Mod assist multimodal cues needed for following 1 step commands during transfer and bed mobility due to receptive language deficits, decreased sustained attention to task, and lethargy.  Discussed in depth with pt's wife pt's current goals and progress in therapies.  Pt's wife verified that pt was very limited in his expressive communication at baseline but had relatively intact receptive language.  SLP discussed that pt's communication and swallowing function are now also limited by decreased sustained to task and provided skilled education regarding distraction management techniques.  Pt's wife verbalized understanding and all questions were answered to her satisfaction at this time.  Pt left in bed at the end of today's  therapy session with bed alarm set and call bell within reach.  Continue per current plan of care.    Function:  Eating Eating            Cognition Comprehension Comprehension assist level: Understands basic 50 - 74% of the time/ requires cueing 25 - 49% of the time  Expression   Expression assist level: Expresses basis less than 25% of the time/requires cueing >75% of the time.  Social Interaction Social Interaction assist level: Interacts appropriately 25 - 49% of time - Needs frequent redirection.  Problem Solving Problem solving assist level: Solves basic less than 25% of the time - needs direction nearly all the time or does not effectively solve problems and may need a restraint for safety  Memory Memory assist level: Recognizes or recalls less than 25% of the time/requires cueing greater than 75% of the time    Pain Pain Assessment Pain Assessment: No/denies pain   Therapy/Group: Individual Therapy  Valrie Jia, Selinda Orion 05/17/2016, 3:51 PM

## 2016-05-17 NOTE — Progress Notes (Signed)
Speech Language Pathology Daily Session Note  Patient Details  Name: James [name] MRN: [id number] Date of Birth: 1940/11/15  Today's Date: 05/17/2016 SLP Individual Time: 1030-1100 SLP Individual Time Calculation (min): 30 min   Short Term Goals: Week 1: SLP Short Term Goal 1 (Week 1): Pt to identify objects in a field of 2 with min A.  SLP Short Term Goal 2 (Week 1): Pt communicate wants/needs via any means with max A SLP Short Term Goal 3 (Week 1): Pt answer  basic Y/N questions with 70% acc and mod A SLP Short Term Goal 4 (Week 1): Pt to demonstrate oral clearance with trials of Dys 2 with mod A SLP Short Term Goal 5 (Week 1): Pt to clear oral cavity of thin liquid boluses within 2 seconds with min verbal cueing  Skilled Therapeutic Interventions:   Skilled treatment session focused on addressing dysphagia goals. Upon SLP arrival patient required re-positioning in wheelchair.  Once repositioned patient was gestured for water bottle on table and attempted to open it; however, with Min question cues patient allowed SLP to provide ice chips.  Following oral care patient consumed trials of ice chips with timely, less than 2 seconds, initiation of swallow.  Patient demonstrated no overt s/s of aspiration.  SLP also facilitated session by providing Mod assist verbal and visual cues to problem solve self-feeding of RN administered medications.  Continue with current plan of care.    Function:  Eating Eating   Modified Consistency Diet: Yes Eating Assist Level: Helper checks for pocketed food;Supervision or verbal cues;Set up assist for;Help managing cup/glass;Help with picking up utensils   Eating Set Up Assist For: Opening containers Helper Scoops Food on Utensil: Occasionally     Cognition Comprehension Comprehension assist level: Understands basic 50 - 74% of the time/ requires cueing 25 - 49% of the time  Expression   Expression assist level: Expresses basis less than 25% of the  time/requires cueing >75% of the time.  Social Interaction Social Interaction assist level: Interacts appropriately 25 - 49% of time - Needs frequent redirection.  Problem Solving Problem solving assist level: Solves basic less than 25% of the time - needs direction nearly all the time or does not effectively solve problems and may need a restraint for safety  Memory Memory assist level: Recognizes or recalls less than 25% of the time/requires cueing greater than 75% of the time    Pain Pain Assessment Pain Assessment: No/denies pain Pain Score: Asleep  Therapy/Group: Individual Therapy  Carmelia Roller., [place] [id number]  [place] 05/17/2016, 5:15 PM

## 2016-05-17 NOTE — Progress Notes (Signed)
Occupational Therapy Session Note  Patient Details  Name: James [name] MRN: [id number] Date of Birth: [birth year]  Today's Date: 05/17/2016 OT Individual Time: 0900-1000 OT Individual Time Calculation (min): 60 min   Short Term Goals: Week 1:  OT Short Term Goal 1 (Week 1): Pt will appropriately use self-care objects with mod instructional cues. OT Short Term Goal 2 (Week 1): Pt will don shirt using hemi-techniques and mod A OT Short Term Goal 3 (Week 1): Pt will complete shower transfer with Mod A OT Short Term Goal 4 (Week 1): Pt will attend to 3 minute ADL task in minimally distractive environment with min cues  Skilled Therapeutic Interventions/Progress Updates:    1:1 OT session focused on modified bathing/dressing, forced/functional use of R UE , R side attention, safety awareness, and sit<>stands. Pt alert today and followed one-step commands 80 % of the time today. Pt completed stand-pivot transfers with overall Min/Mod A, and Mod instructional cues for safety 2/2 impulsivity to stand without assistance.  Pt utilized R UE during bathing tasks with min questioning cues w/ improved functional grasp and shoulder/elbow strength/ROM. Pt required Max instructional cues for hemi-techniques during dressing. Pt utilized objects appropriately throughout session. Mod cues to attend to object on R side. Pt able to squeze toothpaste on to toothbrush using R hand with set-up, Min A, and mod Vc. [place]onker utilized for safety 2/2 diminshed oral motor skills. Pt left seated in w/c at end of session with needs met.   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  Therapy/Group: Individual Therapy  Valma Cava 05/17/2016, 12:37 PM

## 2016-05-17 NOTE — Progress Notes (Signed)
Physical Therapy Session Note  Patient Details  Name: James [name] MRN: [id number] Date of Birth: 09-Sep-1940  Today's Date: 05/17/2016 PT Individual Time: 1105-1203 PT Individual Time Calculation (min): 58 min    Short Term Goals: Week 1:  PT Short Term Goal 1 (Week 1): Patient will perform bed mobility with consistent min A using rails.  PT Short Term Goal 2 (Week 1): Patient will transfer with consistent mod A.  PT Short Term Goal 3 (Week 1): Patient will ambulate 50 ft using LRAD with mod A.  PT Short Term Goal 4 (Week 1): Patient will maintain dynamic standing balance x 2 min with max A.  PT Short Term Goal 5 (Week 1): Patient will initiate wheelchair mobility.  Skilled Therapeutic Interventions/Progress Updates:    Session focused on functional transfers, ambulation, standing balance, R NMR, R attention, safety awareness, and activity tolerance. Patient in wheelchair with quick release belt upon arrival. Patient propelled wheelchair using LUE with mod A for steering to gym and instructed in donning/doffing B brakes with increased cues for R brake. Performed sit <> stand transfers throughout session with max cues for safety with supervision-min A. Gait training with initial trial of RW with max A to correct progressive anterior lean, control RW by keeping closer to body, facilitate weight shifting and max multimodal cues for full R step length and increased L step length to facilitate reciprocal gait pattern and R hip flexion for initial swing phase. Trial gait without AD with RUE around therapist's shoulder with patient demonstrating improved weight shifting, improved B step length, improved upright posture until RLE fatigued requiring mod faded to max A. Performed standing hip abduction strengthening with LUE support on rail with step taps to 4" step using LLE and to 1" step using RLE. Patient left sitting in wheelchair with quick release belt donned in day room for lunch meal.   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   Therapy/Group: Individual Therapy  Laretta Alstrom 05/17/2016, 12:31 PM

## 2016-05-18 ENCOUNTER — Inpatient Hospital Stay (HOSPITAL_COMMUNITY): Payer: Medicare Other | Admitting: Physical Therapy

## 2016-05-18 ENCOUNTER — Inpatient Hospital Stay (HOSPITAL_COMMUNITY): Payer: Medicare Other | Admitting: Occupational Therapy

## 2016-05-18 ENCOUNTER — Inpatient Hospital Stay (HOSPITAL_COMMUNITY): Payer: Medicare Other | Admitting: Speech Pathology

## 2016-05-18 MED ORDER — NITROFURANTOIN MONOHYD MACRO 100 MG PO CAPS
100.0000 mg | ORAL_CAPSULE | Freq: Two times a day (BID) | ORAL | Status: AC
  Administered 2016-05-18 – 2016-05-24 (×14): 100 mg via ORAL
  Filled 2016-05-18 (×14): qty 1

## 2016-05-18 MED ORDER — LIDOCAINE HCL 2 % EX GEL
CUTANEOUS | Status: DC | PRN
  Administered 2016-05-20: 5 via TOPICAL
  Filled 2016-05-18 (×2): qty 5

## 2016-05-18 NOTE — Progress Notes (Signed)
Speech Language Pathology Daily Session Note  Patient Details  Name: James [name] MRN: [id number] Date of Birth: September 03, 1940  Today's Date: 05/18/2016 SLP Individual Time: 1305-1400 SLP Individual Time Calculation (min): 55 min   Short Term Goals: Week 1: SLP Short Term Goal 1 (Week 1): Pt to identify objects in a field of 2 with min A.  SLP Short Term Goal 2 (Week 1): Pt communicate wants/needs via any means with max A SLP Short Term Goal 3 (Week 1): Pt answer  basic Y/N questions with 70% acc and mod A SLP Short Term Goal 4 (Week 1): Pt to demonstrate oral clearance with trials of Dys 2 with mod A SLP Short Term Goal 5 (Week 1): Pt to clear oral cavity of thin liquid boluses within 2 seconds with min verbal cueing  Skilled Therapeutic Interventions: Pt was seen for skilled ST targeting goals for dysphagia and communication.  Pt required max assist multimodal cues for use of safe swallowing strategies due to impulsivity with rate and portion control during intake.  Pt with hiccuping and eructation after large consecutive boluses of nectar thick liquids.  Vocal quality was also intermittently wet following consumption of large boluses.  Pt was able to identify an object when named from a field of three with ~75% accuracy with min assist verbal cues; however, he needed max assist and was less than 50% accurate for matching word to object from field of three.  Pt was able to verbalize to produce his name with max assist repetition cues but spontaneously told the therapist "bye" when leaving the room.   PT left in bed with call bell within reach and quick release belt donned for safety.  Continue per current plan of care.    Function:  Eating Eating   Modified Consistency Diet: Yes Eating Assist Level: Supervision or verbal cues;Set up assist for;Help managing cup/glass;Helper checks for pocketed food;Help with picking up utensils   Eating Set Up Assist For: Opening containers        Cognition Comprehension Comprehension assist level: Understands basic 50 - 74% of the time/ requires cueing 25 - 49% of the time  Expression   Expression assist level: Expresses basis less than 25% of the time/requires cueing >75% of the time.  Social Interaction Social Interaction assist level: Interacts appropriately 25 - 49% of time - Needs frequent redirection.  Problem Solving Problem solving assist level: Solves basic less than 25% of the time - needs direction nearly all the time or does not effectively solve problems and may need a restraint for safety  Memory Memory assist level: Recognizes or recalls less than 25% of the time/requires cueing greater than 75% of the time    Pain Pain Assessment Pain Assessment: No/denies pain  Therapy/Group: Individual Therapy  Marylouise Mallet, Selinda Orion 05/18/2016, 3:19 PM

## 2016-05-18 NOTE — Progress Notes (Signed)
Occupational Therapy Session Note  Patient Details  Name: James [name] MRN: [id number] Date of Birth: Dec 18, 1940  Today's Date: 05/18/2016 OT Individual Time: 0900-1000 OT Individual Time Calculation (min): 60 min     Short Term Goals: Week 1:  OT Short Term Goal 1 (Week 1): Pt will appropriately use self-care objects with mod instructional cues. OT Short Term Goal 2 (Week 1): Pt will don shirt using hemi-techniques and mod A OT Short Term Goal 3 (Week 1): Pt will complete shower transfer with Mod A OT Short Term Goal 4 (Week 1): Pt will attend to 3 minute ADL task in minimally distractive environment with min cues  Skilled Therapeutic Interventions/Progress Updates:   Patient supine in bed and communicated that he wanted to stay there (was resting from earlier session).    In bed he worked on lateral rolls and position, as well as this clinician resisting at his back and hips laterally to increase trunk and hip strength to help increase function as well to increase his independence with bed mobility.  His hip and trunk - especially hip were expectedly weaker on his hemiparesis right side.  As well, patient worked on long sitting and trunk strength in his bed - with instruction required min A to S to elicit core muscles to help him come supine to long sitting in bed and required min A to ly back down in controlled, smooth fashion.  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   Therapy/Group: Individual Therapy  Alfredia Ferguson [facility] 05/18/2016, 7:59 PM

## 2016-05-18 NOTE — Progress Notes (Signed)
Occupational Therapy Session Note  Patient Details  Name: James [name] MRN: [id number] Date of Birth: Apr 13, 1941  Today's Date: 05/18/2016 OT Individual Time: 0900-1000 OT Individual Time Calculation (min): 60 min     Short Term Goals: Week 1:  OT Short Term Goal 1 (Week 1): Pt will appropriately use self-care objects with mod instructional cues. OT Short Term Goal 2 (Week 1): Pt will don shirt using hemi-techniques and mod A OT Short Term Goal 3 (Week 1): Pt will complete shower transfer with Mod A OT Short Term Goal 4 (Week 1): Pt will attend to 3 minute ADL task in minimally distractive environment with min cues  Skilled Therapeutic Interventions/Progress Updates:    Session 1 1:1 OT session focused on functional transfers, sit<>stands, functional use of R Ue, R side attention, modified bathing/dressing using hemi-techniques, and vision therapy. Min A sit<>stand, Mod A stand-pivot to tub bench using grab bars. Pt utilized R UE appropriately 80% of the time during bathing tasks without cues.  OT utilized backward chaining for hemi-techniques with dressing. Pt required Max instructional cues for hemi-techniques. Pt w/ difficulty attending to task with moderate distractions. OT made environmental changes to room for minimal distractions and pt able to attend to dressing task. Pt often squints R eye when trying to focus on objects. OT placed R eye patch and pt able to dictate 5/5 hand gestures correctly using R hand. Will continue to work on vision using eye patch. Pt left seated in w.c with needs met and safety belt on.   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   Therapy/Group: Individual Therapy  Valma Cava 05/18/2016, 4:18 PM

## 2016-05-18 NOTE — Progress Notes (Signed)
James [name] PHYSICAL MEDICINE & REHABILITATION     PROGRESS NOTE  Subjective/Complaints:  Pt laying in bed this AM.  He appears to have slight improved appropriateness with responses.   ROS: Unable to assess due to language.  Objective: Vital Signs: Blood pressure 107/75, pulse (!) 112, temperature 98.4 F (36.9 C), temperature source Oral, resp. rate 16, height 5\' 11"  (1.803 m), weight 95.8 kg (211 lb 4.8 oz), SpO2 100 %. No results found.  Recent Labs  05/16/16 0857  WBC 19.0*  HGB 14.2  HCT 42.4  PLT 323    Recent Labs  05/16/16 0857  NA 135  K 3.9  CL 97*  GLUCOSE 119*  BUN 17  CREATININE 0.82  CALCIUM 9.0   CBG (last 3)  No results for input(s): GLUCAP in the last 72 hours.  Wt Readings from Last 3 Encounters:  05/12/16 95.8 kg (211 lb 4.8 oz)  05/11/16 90.7 kg (200 lb)    Physical Exam:  BP 107/75 (BP Location: Right Arm)   Pulse (!) 112   Temp 98.4 F (36.9 C) (Oral)   Resp 16   Ht 5\' 11"  (1.803 m)   Wt 95.8 kg (211 lb 4.8 oz)   SpO2 100%   BMI 29.47 kg/m  Constitutional: He appears well-developedand well-nourished. NAD.  Head: Old healed incision on scalp Eyes: Conjunctivaeand EOMare normal.  Cardiovascular: RRR. No JVD. Respiratory: Breath sounds normal. No stridor. No respiratory distress. He has no wheezes.  GI: Soft. Bowel sounds are normal. He exhibits no distension. There is no tenderness.  Musculoskeletal: He exhibits no edemaor tenderness.  Neurological: He is alert.  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  Able to follow simple one step motor commands at times, with visual cues.  Motor (Limited by aphasia, but grossly): RUE: ?3/5 LUE 4+/5 RLE: 1+/5 LLE: 4+/5 Skin: Skin is warmand dry. Ecchymosis left posterior neck, LUE, left flank and left hip, slowly improving.   Assessment/Plan: 1. Functional deficits secondary to left intraparenchymal hemorrhage, subarachnoid hemorrhage, subdural  hemorrhage which require 3+ hours per day of interdisciplinary therapy in a comprehensive inpatient rehab setting. Physiatrist is providing close team supervision and 24 hour management of active medical problems listed below. Physiatrist and rehab team continue to assess barriers to discharge/monitor patient progress toward functional and medical goals.  Function:  Bathing Bathing position   Position: Shower  Bathing parts Body parts bathed by patient: Right arm, Left arm, Chest, Abdomen, Front perineal area, Right upper leg, Left upper leg, Right lower leg, Left lower leg Body parts bathed by helper: Buttocks  Bathing assist Assist Level: Touching or steadying assistance(Pt > 75%)      Upper Body Dressing/Undressing Upper body dressing   What is the patient wearing?: Pull over shirt/dress     Pull over shirt/dress - Perfomed by patient: Thread/unthread left sleeve, Put head through opening, Pull shirt over trunk Pull over shirt/dress - Perfomed by helper: Thread/unthread right sleeve        Upper body assist Assist Level: Touching or steadying assistance(Pt > 75%)      Lower Body Dressing/Undressing Lower body dressing   What is the patient wearing?: Pants, Non-skid slipper socks     Pants- Performed by patient: Thread/unthread left pants leg Pants- Performed by helper: Thread/unthread right pants leg, Pull pants up/down   Non-skid slipper socks- Performed by helper: Don/doff left sock, Don/doff right sock  Lower body assist Assist for lower body dressing: Touching or steadying assistance (Pt > 75%)      Toileting Toileting     Toileting steps completed by helper: Adjust clothing prior to toileting, Performs perineal hygiene, Adjust clothing after toileting Toileting Assistive Devices: Grab bar or rail  Toileting assist Assist level: Touching or steadying assistance (Pt.75%)   Transfers Chair/bed transfer Chair/bed transfer activity did not  occur: N/A Chair/bed transfer method: Stand pivot, Squat pivot Chair/bed transfer assist level: Moderate assist (Pt 50 - 74%/lift or lower) Chair/bed transfer assistive device: Armrests     Locomotion Ambulation     Max distance: 45 ft Assist level: Maximal assist (Pt 25 - 49%)   Wheelchair   Type: Manual Max wheelchair distance: 200 ft Assist Level: Moderate assistance (Pt 50 - 74%)  Cognition Comprehension Comprehension assist level: Understands basic 50 - 74% of the time/ requires cueing 25 - 49% of the time  Expression Expression assist level: Expresses basis less than 25% of the time/requires cueing >75% of the time.  Social Interaction Social Interaction assist level: Interacts appropriately 25 - 49% of time - Needs frequent redirection.  Problem Solving Problem solving assist level: Solves basic less than 25% of the time - needs direction nearly all the time or does not effectively solve problems and may need a restraint for safety  Memory Memory assist level: Recognizes or recalls less than 25% of the time/requires cueing greater than 75% of the time     Medical Problem List and Plan: 1. Right hemiparesis, aphasia, cognitive dysfunction, gait disorder secondary to left intraparenchymal hemorrhage, subarachnoid hemorrhage, subdural hemorrhage following a fall 05/04/2016  Cont CIR  Repeat Head CT reviewed, spoke with Radiologist, appears to be significant, but stable bleed 2. DVT Prophylaxis/Anticoagulation: Pharmaceutical: Lovenox restarted 3. Pain Management: Hydrocodone PRN.  4. Mood: Monitor for visual and behavioral changes. History of depression 5. Neuropsych: This patient is not capable of making decisions on hisown behalf. 6. Skin/Wound Care: Routine pressure relief measures 7. Fluids/Electrolytes/Nutrition: Monitor I/O. Appetite reported to be good.  8. PAF?/HTN: Monitor blood pressure and heart rate bid. Continue digoxin and Toprol XL.  9. Seizure disorder: On  keppra bid.  10. E coli UTI: Treated.  11. Urinary retention: Monitor voiding  Bethanechol 5 TID on 10/24  Toilet every 3-4 hours.  12. History of gout. Monitor for flareup 13. Review of primary care notes indicate has CODE STATUS: DO NOT RESUSCITATE, this was discussed with his wife, she will bring no code order from primary care physician.  14. HTN  Monitor with increased activity  WNL at present 15. Hypokalemia  3.9 on 10/23  Supplemented 16. Hyponatremia  Na+ 135 on 10/23 17. Leukocytosis:   Remains Afebrile  WBCs 19.0 on 10/23,   Urine neg, CXR 10/21 reviewed, negative for infection  Will discuss with team regarding other symptoms as unattainable from patient  Repeat UA with few bacteria, Ucx pending - empiric macrobid started  Repeat labs ordered for tomorrow  LOS (Days) 6 A FACE TO FACE EVALUATION WAS PERFORMED  James [name] 05/18/2016 8:56 AM

## 2016-05-18 NOTE — Progress Notes (Signed)
Social Work Patient ID: James [name], male   DOB: 1941-07-05, 75 y.o.   MRN: [id number]  Met with pt and wife to discuss team conference goals-min assist level and target discharge 11/4. She is not sure she can provide this amount Of care to pt, especially is he needs I & O caths. Discussed they do have the option of the rehab wing of [facility]. She is planning to meet with the social worker for that unit this Friday. Wife is physically and mentally tired from taking Care of pt the past 12 years. Discussed having more time to recover and heal form this stroke if he goes to the rehab part of [facility]. Will work on discharge needs.

## 2016-05-18 NOTE — Progress Notes (Signed)
Physical Therapy Session Note  Patient Details  Name: James [name] MRN: [id number] Date of Birth: [birth year]  Today's Date: 05/18/2016 PT Individual Time: 1005-1100 and 1520-1615 PT Individual Time Calculation (min): 55 min and 55 min   Short Term Goals: Week 1:  PT Short Term Goal 1 (Week 1): Patient will perform bed mobility with consistent min A using rails.  PT Short Term Goal 2 (Week 1): Patient will transfer with consistent mod A.  PT Short Term Goal 3 (Week 1): Patient will ambulate 50 ft using LRAD with mod A.  PT Short Term Goal 4 (Week 1): Patient will maintain dynamic standing balance x 2 min with max A.  PT Short Term Goal 5 (Week 1): Patient will initiate wheelchair mobility.  Skilled Therapeutic Interventions/Progress Updates:    Treatment 1: Patient alert in wheelchair upon arrival with quick release belt donned. Session focused on RUE/RLE neuro re-ed and postural control in tall kneeling with small bench for UE support progressed to lateral weight shifting with functional reach using LUE and gross grasp/release RUE with focus on wrist/finger extension in tall kneeling (and seated in wheelchair for active rest break) to manipulate cups, quadruped rocking forward into neutral position and backward as patient unable to tolerate static quadruped in neutral position and max multimodal cues for RUE elbow extension, transitioning quadruped > L sidelying > supine > sitting EOM with supervision-min A, supine bridging x 15 with tactile cues and hooklying isometric hip adduction ball squeezes to fatigue. Patient continues to require cues for safety due to impulsivity. Patient performed stand pivot transfer back to bed and sit > supine with min A and left semi reclined in bed with bed alarm on and four rails up per family request, soft call bell in reach.   Treatment 2: Patient in bed with wife in room upon arrival, appearing fatigued but agreeable to participate. Patient initiated threading  LLE in supine but required cues to thread RLE first and performed bridging with feet stabilized to pull pants over hips. Donned shoes seated EOB with assist for L shoe. Transferred supine > sit with min A to bring RLE off bed and use of rail and performed stand pivot transfer to wheelchair with mod A and cues to attend to RLE. Patient propelled wheelchair to gym using LUE with mod A and locked/unlocked brakes with supervision. Sit <> stand transfer training with supervision. Gait training with RUE around PT's shoulder x 20 ft + 40 ft + 10 ft + 30 ft including turns with max verbal/tactile cues for increased R step length/R foot clearance and manual facilitation for weight shifting and upright posture with min-mod A and variable RLE clearance. Repetitive step taps to 4" step using LLE only, 3 trials of 10 reps each with mirror for visual feedback and mod-max A for upright posture and R LE stance stability. Performed NuStep using BUE/BLE at level 5 x 8 min for R NMR and cardiovascular endurance with patient electing to keep steps per minute > 60. Patient provided seated rest breaks throughout session due to fatigue. Patient returned to bed with min A and left semi reclined with soft call bell in reach, bed alarm on and four rails up per family request.   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  Therapy/Group: Individual Therapy  Laretta Alstrom 05/18/2016, 10:54 AM

## 2016-05-18 NOTE — Patient Care Conference (Signed)
Inpatient RehabilitationTeam Conference and Plan of Care Update Date: 05/18/2016   Time: 2:00 PM    Patient Name: James [name]      Medical Record Number: [id number]  Date of Birth: September 17, 1940 Sex: Male         Room/Bed: 4W04C/4W04C-01 Payor Info: Payor: [place] / Plan: BCBS MEDICARE / Product Type: *No Product type* /    Admitting Diagnosis: TBI  Admit Date/Time:  05/12/2016 11:31 AM Admission Comments: No comment available   Primary Diagnosis:  <principal problem not specified> Principal Problem: <principal problem not specified>  Patient Active Problem List   Diagnosis Date Noted  . Leukocytosis   . Hypokalemia   . Hyponatremia   . Abnormal urine   . 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 Benign essential HTN   . Urinary retention   . History of gout   . Global aphasia   . SAH (subarachnoid hemorrhage) ([place])   . SDH (subdural hematoma) ([place])   . Lymphocytosis   . Subdural hemorrhage following injury ([place]) 05/12/2016    Expected Discharge Date: Expected Discharge Date: 05/28/16  Team Members Present: Physician leading conference: Dr. Delice Lesch Social Worker Present: Ovidio Kin, LCSW Nurse Present: Other (comment) Drucie Opitz) PT Present: Carney Living, PT OT Present: Other (comment) Grayland Ormond Doe-OT) SLP Present: [name], SLP PPS Coordinator present : Daiva Nakayama, RN, CRRN     Current Status/Progress Goal Weekly Team Focus  Medical   Right hemiparesis, aphasia, cognitive dysfunction, gait disorder secondary to left intraparenchymal hemorrhage, subarachnoid hemorrhage, subdural hemorrhage following a fall 05/04/2016  Improve safety, mobility, cognition, electrolytes  See above   Bowel/Bladder   incont. bowel I/O cath q6-8hrs, LBM 10/23  To be cont of bowel and bladder  Monitor B&B function   Swallow/Nutrition/ Hydration             ADL's   Overall Mod/Max A  Overall Min A  cognitive retraining,  functional use of R UE, NMR, sit<>stands, safety awareness, modified bathing/dressing   Mobility   min to max A  supervision-min A  functional mobility, R NMR, safety awareness, standing balance, cognitive remediation, activity tolerance, pt/fam education   Communication             Safety/Cognition/ Behavioral Observations            Pain   C/o headache pain. Norco 1 tab q6hrs given prn  <3  Assess and treat pain during q shift   Skin   Bruising on Lt side UE/LE, MASD bottom/groin-MGP applied   No new skin breakdown/infection  Assess skin q shift      *See Care Plan and progress notes for long and short-term goals.  Barriers to Discharge: Safety, cognition, mobility, urinary retention, abnormal electrolytes, ?UTI    Possible Resolutions to Barriers:  Empiric abx, therapies, bladder meds, follow labs    Discharge Planning/Teaching Needs:  Home with wife if she can manage the care he will need. He would have an option to go to [facility] if needed.      Team Discussion:  Goals-min assist level, currently at a mod/max level. Making progress in therapies, main issues are his cognition. Poor safety awareness and inattention which impacts his other functions. Max cues for swallowing. Requiring I & O caths. Cont of bowel. Question if should go to rehab unit of [facility] upon discharge from here. May be too much care for wife at this level.  Revisions  to Treatment Plan:  Ques home versus SNF at [facility]   Continued Need for Acute Rehabilitation Level of Care: The patient requires daily medical management by a physician with specialized training in physical medicine and rehabilitation for the following conditions: Daily direction of a multidisciplinary physical rehabilitation program to ensure safe treatment while eliciting the highest outcome that is of practical value to the patient.: Yes Daily medical management of patient stability for increased activity during participation  in an intensive rehabilitation regime.: Yes Daily analysis of laboratory values and/or radiology reports with any subsequent need for medication adjustment of medical intervention for : Neurological problems;Urological problems;Other  James [name], James [name] 05/18/2016, 3:58 PM

## 2016-05-19 ENCOUNTER — Inpatient Hospital Stay (HOSPITAL_COMMUNITY): Payer: Medicare Other | Admitting: Physical Therapy

## 2016-05-19 ENCOUNTER — Inpatient Hospital Stay (HOSPITAL_COMMUNITY): Payer: Medicare Other | Admitting: Speech Pathology

## 2016-05-19 ENCOUNTER — Inpatient Hospital Stay (HOSPITAL_COMMUNITY): Payer: Medicare Other | Admitting: Occupational Therapy

## 2016-05-19 ENCOUNTER — Inpatient Hospital Stay (HOSPITAL_COMMUNITY): Payer: Medicare Other

## 2016-05-19 DIAGNOSIS — I10 Essential (primary) hypertension: Secondary | ICD-10-CM

## 2016-05-19 DIAGNOSIS — I482 Chronic atrial fibrillation: Secondary | ICD-10-CM

## 2016-05-19 DIAGNOSIS — Z5309 Procedure and treatment not carried out because of other contraindication: Secondary | ICD-10-CM

## 2016-05-19 DIAGNOSIS — N390 Urinary tract infection, site not specified: Secondary | ICD-10-CM

## 2016-05-19 LAB — CBC WITH DIFFERENTIAL/PLATELET
Basophils Absolute: 0 10*3/uL (ref 0.0–0.1)
Basophils Relative: 0 %
EOS PCT: 1 %
Eosinophils Absolute: 0.1 10*3/uL (ref 0.0–0.7)
HCT: 41.2 % (ref 39.0–52.0)
Hemoglobin: 14.1 g/dL (ref 13.0–17.0)
LYMPHS ABS: 2.6 10*3/uL (ref 0.7–4.0)
LYMPHS PCT: 17 %
MCH: 28 pg (ref 26.0–34.0)
MCHC: 34.2 g/dL (ref 30.0–36.0)
MCV: 81.9 fL (ref 78.0–100.0)
MONOS PCT: 10 %
Monocytes Absolute: 1.5 10*3/uL — ABNORMAL HIGH (ref 0.1–1.0)
Neutro Abs: 10.8 10*3/uL — ABNORMAL HIGH (ref 1.7–7.7)
Neutrophils Relative %: 72 %
PLATELETS: 283 10*3/uL (ref 150–400)
RBC: 5.03 MIL/uL (ref 4.22–5.81)
RDW: 12.9 % (ref 11.5–15.5)
WBC: 15.1 10*3/uL — AB (ref 4.0–10.5)

## 2016-05-19 LAB — BASIC METABOLIC PANEL
Anion gap: 10 (ref 5–15)
BUN: 13 mg/dL (ref 6–20)
CHLORIDE: 93 mmol/L — AB (ref 101–111)
CO2: 26 mmol/L (ref 22–32)
CREATININE: 0.6 mg/dL — AB (ref 0.61–1.24)
Calcium: 8.9 mg/dL (ref 8.9–10.3)
GFR calc Af Amer: >60 mL/min (ref >60)
GFR calc non Af Amer: >60 mL/min (ref >60)
GLUCOSE: 105 mg/dL — AB (ref 65–99)
POTASSIUM: 4.1 mmol/L (ref 3.5–5.1)
Sodium: 129 mmol/L — ABNORMAL LOW (ref 135–145)

## 2016-05-19 LAB — URINE CULTURE

## 2016-05-19 NOTE — Progress Notes (Signed)
   05/19/16 1059  What Happened  Was fall witnessed? Yes  Who witnessed fall? Patient across hall [phone]  Patients activity before fall other (comment) (sitting in recliner)  Point of contact buttocks  Was patient injured? No  Follow Up  MD notified Algis Liming, PA-C  Time MD notified [phone]  Family notified Yes-comment (wife arrived to visit)  Time family notified [phone]  Additional tests No  Simple treatment Other (comment) (returned to recliner)  Progress note created (see row info) Yes  Adult Fall Risk Assessment  Risk Factor Category (scoring not indicated) Fall has occurred during this admission (document High fall risk)  Patient's Fall Risk High Fall Risk (>13 points)  Adult Fall Risk Interventions  Required Bundle Interventions *See Row Information* High fall risk - low, moderate, and high requirements implemented  Additional Interventions Lap belt while in chair/wheelchair;Room near nurses station;Use of appropriate toileting equipment (bedpan, BSC, etc.)  Screening for Fall Injury Risk  Risk For Fall Injury- See Row Information  F  Vitals  Temp 99 F (37.2 C)  Temp Source Oral  BP (!) 119/101  BP Location Left Arm  BP Method Automatic  Patient Position (if appropriate) Sitting  Pulse Rate 67  Pulse Rate Source Dinamap  Resp 18  Oxygen Therapy  SpO2 100 %  O2 Device Room Air  Pain Assessment  Pain Assessment Faces  Faces Pain Scale 0  Neurological  Neuro (WDL) X  Level of Consciousness Alert  Orientation Level Oriented to person;Disoriented to place;Disoriented to time;Disoriented to situation  Location manager Expressive aphasia;Slurred/Dysarthria;Nods/gestures appropriately  Pupil Assessment  No  Motor Function/Sensation Assessment Motor strength  R Hand Grip Moderate  L Hand Grip Moderate   R Foot Dorsiflexion Weak  L Foot Dorsiflexion Moderate  R Foot Plantar Flexion Weak  L Foot Plantar Flexion Moderate  RUE  Motor Response Purposeful movement  RUE Motor Strength 5  LUE Motor Response Purposeful movement  LUE Motor Strength 4  RLE Motor Response Purposeful movement  RLE Motor Strength 4  LLE Motor Response Purposeful movement  LLE Motor Strength 4  Neuro Symptoms None  Musculoskeletal  Musculoskeletal (WDL) X  Assistive Device Wheelchair  Generalized Weakness Yes  Weight Bearing Restrictions No  Integumentary  Integumentary (WDL) X  Skin Color Appropriate for ethnicity  Skin Condition Dry  Skin Integrity Ecchymosis;MSAD  Moisture Associated Skin Damage Location Groin  Moisture Associated Skin Damage Orientation Right;Left  Moisture Associated Skin Damage Intervention Other (Comment) (MGP)  Ecchymosis Location Face;Arm;Abdomen;Hip;Leg  Ecchymosis Location Orientation Left  Skin Turgor Non-tenting

## 2016-05-19 NOTE — Progress Notes (Signed)
Physical Therapy Session Note  Patient Details  Name: James [name] MRN: [id number] Date of Birth: [birth year]  Today's Date: 05/19/2016 PT Individual Time: 1100-1200 and 1550-1558 PT Individual Time Calculation (min): 60 min and 8 min   Short Term Goals: Week 1:  PT Short Term Goal 1 (Week 1): Patient will perform bed mobility with consistent min A using rails.  PT Short Term Goal 2 (Week 1): Patient will transfer with consistent mod A.  PT Short Term Goal 3 (Week 1): Patient will ambulate 50 ft using LRAD with mod A.  PT Short Term Goal 4 (Week 1): Patient will maintain dynamic standing balance x 2 min with max A.  PT Short Term Goal 5 (Week 1): Patient will initiate wheelchair mobility.  Skilled Therapeutic Interventions/Progress Updates:    Treatment 1: Upon entering room, patient observed transferring unassisted to bed from unlocked recliner with feet rest elevated and quick release belt removed. Reinforced safety in room with patient laughing in response. Discussed with nurse tech James [name] who reports patient in recliner due to attempting to climb out of bed earlier with unassisted fall. Updated safety plan for patient to be in recliner at RN station for supervision when family not present. Patient's wife arrived and discussed patient safety, wife in agreement with plan. Patient's wife stating, "The real James [name] is back!" in response to restlessness. Patient's wife encouraged to bring in iPad per report that patient enjoyed playing games on iPad. Retrieved chair alarm for patient's room. Patient sat EOB with min A and encouragement due to fatigue. Gait training x 30 ft with max A with RUE around therapist's shoulder with decreased safety awareness and patient unable to follow basic commands for ambulation, reaching unsafely for objects/walls. Patient propelled wheelchair to gym using LUE with mod A. Instructed in stair training but patient unable to follow commands for safe sequencing  requiring max A with 1-2 rails. Performed NuStep using BUE/BLE at level 5 x 10 min for R NMR and endurance with max multimodal cues for attending to RUE and RLE. Patient returned to room and transferred to recliner with mod A. Patient left at RN station in recliner with quick release belt donned.   Treatment 2: Patient asleep upon arrival with wife in room, requesting that PT allow patient to rest due to fatigue after previous OT session. Wife educated regarded discharge planning and f/u therapy as wife does not feel she will be able to provide level of care patient will require at discharge as well as current level of function and long term goals. Wife verbalized understanding. Patient left asleep with 4 rails up per family request and bed alarm on with wife in room.   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   Therapy/Group: Individual Therapy  Laretta Alstrom 05/19/2016, 12:24 PM

## 2016-05-19 NOTE — Progress Notes (Signed)
Physical Therapy Weekly Progress Note  Patient Details  Name: James [name] MRN: [id number] Date of Birth: 1941-04-18  Beginning of progress report period: May 13, 2016 End of progress report period: May 20, 2016  Today's Date: 05/20/2016 PT Individual Time: 0900-1000 and 1415-1500 PT Individual Time Calculation (min): 60 min and 45 min  Patient has met 4 of 5 short term goals.  Patient required supervision-min A for bed mobility, min-mod A for transfers, and mod-max A for gait and stairs progressing to min-mod A with use of SPC up to 200 ft this date. Patient requires total A for safety due to impulsivity and lack of awareness of physical and cognitive deficits. 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 Patient currently demonstrating behaviors consistent with [place]. Patient and family education ongoing.   Patient continues to demonstrate the following deficits: muscle weakness, muscle paralysis, decreased cardiorespiratory endurance, impaired timing and sequencing, abnormal tone, unbalanced muscle activation, motor apraxia, decreased coordination and decreased visual acuity and decreased visual perceptual skills, decreased attention, decreased awareness, decreased problem solving, decreased safety awareness, decreased memory, decreased standing balance, decreased postural control, hemiplegia and decreased balance strategies and therefore will continue to benefit from skilled PT intervention to enhance overall performance with activity tolerance, balance, postural control, ability to compensate for deficits, functional use of  right upper extremity and right lower extremity, attention, awareness and coordination.  Patient progressing toward long term goals.  Continue plan of care.  PT Short Term Goals Week 1:  PT Short Term Goal 1 (Week 1): Patient will perform bed mobility with consistent min A  using rails.  PT Short Term Goal 1 - Progress (Week 1): Met PT Short Term Goal 2 (Week 1): Patient will transfer with consistent mod A.  PT Short Term Goal 2 - Progress (Week 1): Met PT Short Term Goal 3 (Week 1): Patient will ambulate 50 ft using LRAD with mod A.  PT Short Term Goal 3 - Progress (Week 1): Not met PT Short Term Goal 4 (Week 1): Patient will maintain dynamic standing balance x 2 min with max A.  PT Short Term Goal 4 - Progress (Week 1): Met PT Short Term Goal 5 (Week 1): Patient will initiate wheelchair mobility. PT Short Term Goal 5 - Progress (Week 1): Met Week 2:  PT Short Term Goal 1 (Week 2): = LTGs due to anticipated LOS   Skilled Therapeutic Interventions/Progress Updates:    Treatment 1: Patient awake in bed upon arrival. Donned shorts with assist to thread BLE and patient initiating bridging to pull up over hips. Transferred supine > sit with HOB raised and rail with supervision for UB dressing with supervision and no cues for sequencing. Performed stand pivot transfer to wheelchair with min A. Gait training using SPC x 200 ft in controlled environment including 4 turns with min-mod A overall, increased B stride length and improved R foot clearance noted with manual facilitation for weight shifting. Patient instructed in gait using SPC x 50 ft while kicking yoga block with R foot only to facilitate increased R foot clearance, lateral weight shifts, and increased L stance time with mod A overall and max A when patient attempted to kick with LLE. Stair training up/down 12 (6") stairs using 2 rails with reciprocal pattern with min A and max verbal cues for attending to RUE on rail. Gait training using SPC to and from ortho gym with min A overall. Patient  performed simulated car transfer with verbal/visual cues for sequencing and technique with min A and max cues to bring R side of body in car. Patient transferred to [facility] with min A and left at RN station with quick release  belt donned.   Treatment 2: Patient in wheelchair in gym, handoff from OT. Patient fatigued and appeared to be in pain. Patient ambulated approx 30 ft using SPC with min A to mat table for supine rest break. Engaged in supine LE NMR: SLR x 10 each LE, bridging x 20, assisted lumbar rotation x 20 and PROM for B hamstrings, piriformis, and trunk rotation. Patient ambulated to NuStep using SPC and performed NuStep using BUE/BLE first 5 min and BLE only last 5 min at level 5 for neuro re-ed and endurance. Gait training using SPC back to room x 150 ft with min A, patient demonstrating improved RLE clearance, equal stride length, weight shifting, and upright posture this date. Patient transferred sit > supine with supervision and left semi reclined in bed with 4 rails up, soft call bell, and bed alarm on after discussing patient fatigue and pain with RN Marzetta Board to notify her of patient position.    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  Therapy/Group: Individual Therapy  Laretta Alstrom 05/20/2016, 7:47 AM

## 2016-05-19 NOTE — Progress Notes (Signed)
Occupational Therapy Session Note  Patient Details  Name: James [name] MRN: [id number] Date of Birth: Oct 10, 1940  Today's Date: 05/19/2016 OT Individual Time: 0830-0900 OT Individual Time Calculation (min): 30 min     Short Term Goals:Week 1:  OT Short Term Goal 1 (Week 1): Pt will appropriately use self-care objects with mod instructional cues. OT Short Term Goal 2 (Week 1): Pt will don shirt using hemi-techniques and mod A OT Short Term Goal 3 (Week 1): Pt will complete shower transfer with Mod A OT Short Term Goal 4 (Week 1): Pt will attend to 3 minute ADL task in minimally distractive environment with min cues  Skilled Therapeutic Interventions/Progress Updates:    Pt received sitting in recliner with quick release belt on. Vocalized yes that he would like to get dressed now. Declined toileting. New brief donned for as other one was slightly damp.  Excellent initiation of use of RUE with dressing. Pt visually attended to RUE and placed that arm in shirt first with one cue to pull it all the way over the elbow. Pt responded well to visual and tactile cues, but was also able to respond to verbal questions. When asked if he likes the beach based on his tan lines, he smiled and laughed and said yes. When asked which beach he lives at, he was able to vocalize [facility]. Donned pants over feet and then stood up with min A by pushing up with BUE and using back of arm chair in front of him for support. Pt able to pull pants over hips. Stood 4x total with knee flexion in R knee. Pt adjusted back into recliner with quick release belt.  Last 10 min of session, focused on RUE AROM/coordination with 1# dowel bar. Pt actively was able to achieve full sh flex AROM overhead 12x and chest presses 12x.  Maintained R grasp on bar well. Pt with call light in lap and all needs met.    Therapy Documentation Precautions:  Precautions Precautions: Fall Precaution Comments: residual deficits from 2005  TBI/CVA, impaired vision, R sided weakness Restrictions Weight Bearing Restrictions: No   Pain: Pain Assessment Pain Assessment: Faces Faces Pain Scale: No hurt ADL: ADL ADL Comments: See functional navigator  See Function Navigator for Current Functional Status.   Therapy/Group: Individual Therapy  Neysha Criado 05/19/2016, 8:24 AM

## 2016-05-19 NOTE — Progress Notes (Signed)
Speech Language Pathology Daily Session Note  Patient Details  Name: James [name] MRN: [id number] Date of Birth: 27-Jan-1941  Today's Date: 05/19/2016 SLP Individual Time: 0905-1000 SLP Individual Time Calculation (min): 55 min   Short Term Goals:Week 1: SLP Short Term Goal 1 (Week 1): Pt to identify objects in a field of 2 with min A.  SLP Short Term Goal 2 (Week 1): Pt communicate wants/needs via any means with max A SLP Short Term Goal 3 (Week 1): Pt answer  basic Y/N questions with 70% acc and mod A SLP Short Term Goal 4 (Week 1): Pt to demonstrate oral clearance with trials of Dys 2 with mod A SLP Short Term Goal 5 (Week 1): Pt to clear oral cavity of thin liquid boluses within 2 seconds with min verbal cueing SLP Short Term Goal 6 (Week 1): Pt will sustain his attention to basic, familiar tasks for 1 minute with min verbal cues for redirection.   Skilled Therapeutic Interventions: Pt was seen for skilled ST targeting goals for dysphagia, cognition, and communication.  SLP facilitated the session with trials of dys 2 textures to continue working towards diet progression.  Pt continues to need max assist multimodal cues for rate and portion control with intake due to impulsivity and decreased attention to bolus. Despite need for heavy cues pt was able to clear solids from the oral cavity post swallow.  No overt s/s of aspiration evident with solids or thickened liquids.  Pt needed max assist multimodal cues for tracing of biographical information during diagnostic treatment of written expression.   Decreased sustained attention to tasks appears to be impacting function in all ST domains and pt needed mod-max assist during basic sorting tasks, therefore recommend initiation of attention goals.  Pt was left in recliner with quick release belt donned and handed off to primary RN.  Continue per current plan of care.       Function:  Eating Eating   Modified Consistency Diet: Yes Eating Assist  Level: Supervision or verbal cues;Set up assist for;Helper checks for pocketed food;Help with picking up utensils           Cognition Comprehension Comprehension assist level: Understands basic 50 - 74% of the time/ requires cueing 25 - 49% of the time  Expression   Expression assist level: Expresses basis less than 25% of the time/requires cueing >75% of the time.  Social Interaction Social Interaction assist level: Interacts appropriately 25 - 49% of time - Needs frequent redirection.  Problem Solving Problem solving assist level: Solves basic less than 25% of the time - needs direction nearly all the time or does not effectively solve problems and may need a restraint for safety  Memory Memory assist level: Recognizes or recalls less than 25% of the time/requires cueing greater than 75% of the time    Pain Pain Assessment Pain Assessment: No/denies pain Faces Pain Scale: No hurt  Therapy/Group: Individual Therapy  James [name], Selinda Orion 05/19/2016, 12:36 PM

## 2016-05-19 NOTE — Progress Notes (Signed)
Occupational Therapy Session Note  Patient Details  Name: James [name] MRN: [id number] Date of Birth: 02-Sep-1940  Today's Date: 05/19/2016 OT Individual Time: 1415-1500 OT Individual Time Calculation (min): 45 min  Short Term Goals: Week 1:  OT Short Term Goal 1 (Week 1): Pt will appropriately use self-care objects with mod instructional cues. OT Short Term Goal 2 (Week 1): Pt will don shirt using hemi-techniques and mod A OT Short Term Goal 3 (Week 1): Pt will complete shower transfer with Mod A OT Short Term Goal 4 (Week 1): Pt will attend to 3 minute ADL task in minimally distractive environment with min cues  Skilled Therapeutic Interventions/Progress Updates:    OT session focused on functional mobility, general strengthening of R side, attention to task, and following commands. Pt required Max instructional cues and initiation by therapist to perform LB dressing task today. Pt then completed functional mobility with +2 assist and Max A to facilitate R step and maintain dynamic balance. Pt very distracted and impulsive today with difficulty maintaining attention to task in moderately distracting environment. Pt returned to bed at end of session with 4 bed rails up and bed alarm on.   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   Therapy/Group: Individual Therapy  Valma Cava 05/19/2016, 3:24 PM

## 2016-05-19 NOTE — Progress Notes (Addendum)
[facility] PHYSICAL MEDICINE & REHABILITATION     PROGRESS NOTE  Subjective/Complaints:  Pt laying in bed this AM.  He has improved accuracy to responses.  He continues to smile and denies issues.   ROS: Unable to assess due to language.  Objective: Vital Signs: Blood pressure 127/64, pulse 62, temperature 98.6 F (37 C), temperature source Oral, resp. rate 18, height 5\' 11"  (1.803 m), weight 97.3 kg (214 lb 9.6 oz), SpO2 98 %. No results found.  Recent Labs  05/19/16 0551  WBC 15.1*  HGB 14.1  HCT 41.2  PLT 283    Recent Labs  05/19/16 0551  NA 129*  K 4.1  CL 93*  GLUCOSE 105*  BUN 13  CREATININE 0.60*  CALCIUM 8.9   CBG (last 3)  No results for input(s): GLUCAP in the last 72 hours.  Wt Readings from Last 3 Encounters:  05/18/16 97.3 kg (214 lb 9.6 oz)  05/11/16 90.7 kg (200 lb)    Physical Exam:  BP 127/64 (BP Location: Left Arm)   Pulse 62   Temp 98.6 F (37 C) (Oral)   Resp 18   Ht 5\' 11"  (1.803 m)   Wt 97.3 kg (214 lb 9.6 oz)   SpO2 98%   BMI 29.93 kg/m  Constitutional: He appears well-developedand well-nourished. NAD.  Head: Old healed incision on scalp Eyes: Conjunctivaeand EOMare normal.  Cardiovascular: Irregularly, irregular. No JVD. Respiratory: Breath sounds normal. No stridor. No respiratory distress. He has no wheezes.  GI: Soft. Bowel sounds are normal. He exhibits no distension. There is no tenderness.  Musculoskeletal: He exhibits no edemaor tenderness.  Neurological: He is alert.  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  Able to follow simple one step motor commands at times, with visual cues.  Motor (Limited by aphasia, but grossly): RUE: ?3/5 LUE 4+/5 RLE: 1+/5 LLE: 4+/5 Skin: Skin is warmand dry. Ecchymosis left posterior neck, LUE, left flank and left hip, stable.   Assessment/Plan: 1. Functional deficits secondary to left intraparenchymal hemorrhage, subarachnoid hemorrhage, subdural  hemorrhage which require 3+ hours per day of interdisciplinary therapy in a comprehensive inpatient rehab setting. Physiatrist is providing close team supervision and 24 hour management of active medical problems listed below. Physiatrist and rehab team continue to assess barriers to discharge/monitor patient progress toward functional and medical goals.  Function:  Bathing Bathing position   Position: Shower  Bathing parts Body parts bathed by patient: Right arm, Left arm, Chest, Abdomen, Front perineal area, Buttocks, Right upper leg, Left upper leg, Right lower leg, Left lower leg Body parts bathed by helper: Back  Bathing assist Assist Level: Touching or steadying assistance(Pt > 75%)      Upper Body Dressing/Undressing Upper body dressing   What is the patient wearing?: Pull over shirt/dress     Pull over shirt/dress - Perfomed by patient: Thread/unthread left sleeve, Put head through opening, Pull shirt over trunk Pull over shirt/dress - Perfomed by helper: Thread/unthread right sleeve        Upper body assist Assist Level: Touching or steadying assistance(Pt > 75%)      Lower Body Dressing/Undressing Lower body dressing   What is the patient wearing?: Pants, Socks, Shoes     Pants- Performed by patient: Thread/unthread right pants leg, Thread/unthread left pants leg, Pull pants up/down Pants- Performed by helper: Thread/unthread right pants leg, Pull pants up/down   Non-skid slipper socks- Performed by helper: Don/doff left sock, Don/doff right sock Socks - Performed by patient:  Don/doff left sock Socks - Performed by helper: Don/doff right sock Shoes - Performed by patient: Don/doff left shoe Shoes - Performed by helper: Don/doff right shoe          Lower body assist Assist for lower body dressing: Touching or steadying assistance (Pt > 75%)      Toileting Toileting     Toileting steps completed by helper: Adjust clothing prior to toileting, Performs perineal  hygiene, Adjust clothing after toileting Toileting Assistive Devices: Grab bar or rail  Toileting assist Assist level: Touching or steadying assistance (Pt.75%)   Transfers Chair/bed transfer Chair/bed transfer activity did not occur: N/A Chair/bed transfer method: Stand pivot Chair/bed transfer assist level: Touching or steadying assistance (Pt > 75%) Chair/bed transfer assistive device: Armrests     Locomotion Ambulation     Max distance: 40 ft Assist level: Moderate assist (Pt 50 - 74%)   Wheelchair   Type: Manual Max wheelchair distance: 200 ft Assist Level: Moderate assistance (Pt 50 - 74%)  Cognition Comprehension Comprehension assist level: Understands basic 50 - 74% of the time/ requires cueing 25 - 49% of the time  Expression Expression assist level: Expresses basis less than 25% of the time/requires cueing >75% of the time.  Social Interaction Social Interaction assist level: Interacts appropriately 25 - 49% of time - Needs frequent redirection.  Problem Solving Problem solving assist level: Solves basic less than 25% of the time - needs direction nearly all the time or does not effectively solve problems and may need a restraint for safety  Memory Memory assist level: Recognizes or recalls less than 25% of the time/requires cueing greater than 75% of the time     Medical Problem List and Plan: 1. Right hemiparesis, aphasia, cognitive dysfunction, gait disorder secondary to left intraparenchymal hemorrhage, subarachnoid hemorrhage, subdural hemorrhage following a fall 05/04/2016  Cont CIR  Repeat Head CT reviewed, spoke with Radiologist, appears to be significant, but stable bleed 2. DVT Prophylaxis/Anticoagulation: Pharmaceutical: Lovenox restarted 3. Pain Management: Hydrocodone PRN.  4. Mood: Monitor for visual and behavioral changes. History of depression 5. Neuropsych: This patient is not capable of making decisions on hisown behalf. 6. Skin/Wound Care:  Routine pressure relief measures 7. Fluids/Electrolytes/Nutrition: Monitor I/O. Appetite reported to be good.  8. PAF?/HTN: Monitor blood pressure and heart rate bid. Continue digoxin and Toprol XL.   Will order ECG and consider Cards input 9. Seizure disorder: On keppra bid.  10. E coli UTI: Treated.  11. Urinary retention: Monitor voiding  Bethanechol 5 TID on 10/24, ?improving  Toilet every 3-4 hours.  12. History of gout. Monitor for flareup 13. Review of primary care notes indicate has CODE STATUS: DO NOT RESUSCITATE, this was discussed with his wife, she will bring no code order from primary care physician.  14. HTN  Monitor with increased activity  WNL at present 15. Hypokalemia: Resolved after suppmentation  4.1 on 10/26 16. Hyponatremia  Na+ 129 on 10/26  Cont to monitor 17. Recurrent UTI:   Remains Afebrile  WBCs 15.1 on 10/26, ?trending down   Urine neg, CXR 10/21 reviewed, negative for infection  Repeat Ucx with E. Faecalis, macrobid 10/25-11/1  LOS (Days) 7 A FACE TO FACE EVALUATION WAS PERFORMED  Ankit Lorie Phenix 05/19/2016 9:05 AM

## 2016-05-19 NOTE — Consult Note (Signed)
CARDIOLOGY CONSULT NOTE   Patient ID: James [name] MRN: [id number], DOB/AGE: 1941-05-03   Admit date: 05/12/2016 Date of Consult: 05/19/2016  Primary Physician: Adin Hector, MD Primary Cardiologist: None   Reason for consult:  Atrial fibrillation  Problem List  Past Medical History:  Diagnosis Date  . Alcohol abuse, in remission   . Atrial fibrillation ([place])   . Bilateral renal masses   . Closed right ankle fracture   . 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 Gait disorder   . Gout   . Hypertension   . Seizure disorder ([place])   . 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     Past Surgical History:  Procedure Laterality Date  . CRANIECTOMY FOR DEPRESSED SKULL FRACTURE  2009   with MRSA infection  . CRANIOTOMY     X 2  . HERNIA REPAIR     during childhood  . ORIF ANKLE FRACTURE Left 2012    Allergies  Allergies  Allergen Reactions  . Bactrim Other (See Comments)    Unknown  . Codeine Other (See Comments)    Unknown  . Tetanus Toxoids Other (See Comments)    Unknown   HPI   James [name] is a 75 year old male with history chronic persistent atrial fibrillation since at least 2005, SDH after a fall while on coumadin, s/p craniotomy in 2005 and 2009 with residual aphasia, visual deficits and right hemiparesis, ETOH abuse, gout, seizure disorder who was admitted to [facility] on 05/04/16 after fall [place] 15 [place] after he lost his balance. He was found to have 9 mm left IPH, bilateral SDH and SAH. NS recommended conservative care with serial CT. Follow up CT head with increase in bleed but neurologically reported to be improving. No surgical intervention needed per NS.  He is currently in inpatient rehab. His wife reports that they have known about relation for at least 12 years. At the beginning they were following  with cardiology but then they decided to follow just with their primary care physician. After the first fall requiring craniotomy they have decided that they will pursue just rate control with no anticoagulation. He has never had problems with his atrial fibrillation he has been taking baby aspirin for the last 12 years. He has also been taking digoxin and metoprolol for rate control and the wife is asking if there is any way we can discontinue so we simplify his medication regimen. She doesn't want me to change his medications anyhow she is not interested any further workup including echocardiogram.  Inpatient Medications  . bethanechol  5 mg Oral TID  . digoxin  0.125 mg Oral Daily  . enoxaparin (LOVENOX) injection  40 mg Subcutaneous Q24H  . famotidine  20 mg Oral BID  . levETIRAcetam  500 mg Oral BID  . metoprolol tartrate  50 mg Oral BID  . nitrofurantoin (macrocrystal-monohydrate)  100 mg Oral Q12H  . sertraline  100 mg Oral Daily  . simvastatin  40 mg Oral q1800    Family History Family History  Problem Relation Age of Onset  . Family history unknown: Yes     Social History Social History   Social History  . Marital status: Married    Spouse name: N/A  . Number of children: N/A  . Years of education: N/A  Occupational History  . Not on file.   Social History Main Topics  . Smoking status: Never Smoker  . Smokeless tobacco: Never Used  . Alcohol use No  . Drug use: No  . Sexual activity: Not Currently   Other Topics Concern  . Not on file   Social History Narrative  . No narrative on file     Review of Systems  General:  No chills, fever, night sweats or weight changes.  Cardiovascular:  No chest pain, dyspnea on exertion, edema, orthopnea, palpitations, paroxysmal nocturnal dyspnea. Dermatological: No rash, lesions/masses Respiratory: No cough, dyspnea Urologic: No hematuria, dysuria Abdominal:   No nausea, vomiting, diarrhea, bright red blood per rectum,  melena, or hematemesis Neurologic:  No visual changes, wkns, changes in mental status. All other systems reviewed and are otherwise negative except as noted above.  Physical Exam  Blood pressure 106/67, pulse 69, temperature 97.9 F (36.6 C), temperature source Axillary, resp. rate 17, height 5\' 11"  (1.803 m), weight 214 lb 9.6 oz (97.3 kg), SpO2 99 %.  General: Aphasic Psych: Normal affect. Neuro: aphasic, bedridden. Weakness in his right upper and right lower extremity. HEENT: Normal  Neck: Supple without bruits or JVD. Lungs:  Resp regular and unlabored, CTA. Heart: iRRR no s3, s4, or murmurs. Abdomen: Soft, non-tender, non-distended, BS + x 4.  Extremities: No clubbing, cyanosis or edema. DP/PT/Radials 2+ and equal bilaterally.  Labs  No results for input(s): CKTOTAL, CKMB, TROPONINI in the last 72 hours. Lab Results  Component Value Date   WBC 15.1 (H) 05/19/2016   HGB 14.1 05/19/2016   HCT 41.2 05/19/2016   MCV 81.9 05/19/2016   PLT 283 05/19/2016    Recent Labs Lab 05/13/16 0858  05/19/16 0551  NA 131*  < > 129*  K 3.4*  < > 4.1  CL 94*  < > 93*  CO2 27  < > 26  BUN 13  < > 13  CREATININE 0.78  < > 0.60*  CALCIUM 8.6*  < > 8.9  PROT 7.8  --   --   BILITOT 1.1  --   --   ALKPHOS 232*  --   --   ALT 147*  --   --   AST 113*  --   --   GLUCOSE 143*  < > 105*  < > = values in this interval not displayed. Lab Results  Component Value Date   CHOL 161 04/15/2011   HDL 21 (L) 04/15/2011   LDLCALC 93 04/15/2011   TRIG 234 (H) 04/15/2011   Lab Results  Component Value Date   DDIMER 2.09 (H) 04/18/2011   Radiology/Studies  Dg Chest 2 View  Result Date: 05/14/2016 CLINICAL DATA:  75 year old male with history of leukocytosis. History of atrial fibrillation. Hypertension. EXAM: CHEST  2 VIEW COMPARISON:  Chest x-ray 04/20/2011. FINDINGS: Lung volumes are low. No consolidative airspace disease. No definite pleural effusions. Ill-defined bibasilar opacities,  favored to reflect areas of subsegmental atelectasis given the low lung volumes. No evidence of pulmonary edema. Heart size is mildly enlarged. Upper mediastinal contours are within normal limits. Aortic atherosclerosis. IMPRESSION: 1. Low lung volumes with bibasilar subsegmental atelectasis. 2. Aortic atherosclerosis. 3. Mild cardiomegaly. Electronically Signed   By: Vinnie Langton M.D.   On: 05/14/2016 13:42   Ct Head Wo Contrast  Addendum Date: 05/13/2016   CONCLUSION: The previously described unusual extensive left hemisphere hemorrhage is lobulated Subdural Hematoma (is extra-axial, not intra-axial). That, along with the anterior left  frontal lobe hemorrhagic contusion, the right subdural hematoma, and the parafalcine/tentorial subdural hematoma, are stable compared to outside Head CT on 05/10/2016. This information was relayed to Dr. Posey Pronto at 5:51 pm on 05/13/2016. Electronically Signed   By: Genevie Ann M.D.   On: 05/13/2016 17:51   Addendum Date: 05/13/2016   ADDENDUM REPORT: 05/13/2016 15:21 ADDENDUM: Study discussed by telephone with Dr. Delice Lesch On 05/13/2016 at 1457 hours. He advised that the patient was transferred recently from new [facility]. There are noncontrast head CTs from that facility which have been provided on compact disc. I am having those loaded into [facility] PACS, and once loaded will issue another addendum comparing to the current exam. Electronically Signed   By: Genevie Ann M.D.   On: 05/13/2016 15:21   Result Date: 05/13/2016 CLINICAL DATA:  75 year old male status post fall down stairs with intracranial hemorrhage. Initial encounter.  IMPRESSION: 1. Unusual appearance of extensive apparently intra-axial hemorrhage throughout the left temporal and occipital lobes. Similar indistinct intra-axial appearing hemorrhage in both anterior inferior frontal gyri, and to the left superior cerebellum. 2. Superimposed more typical appearing left anterior superior frontal lobe  intra-axial hematoma. That lesion has an estimated blood volume of 20 mL. There is surrounding vasogenic edema, and regional mass effect including rightward midline shift up to 10 mm at the level of the frontal horn. 3. Superimposed widespread peripheral right mixed density subdural hematoma measuring up to 6 mm in thickness. Superimposed small volume of generalized parafalcine and tentorial subdural hematoma. 4. Underlying chronic left hemisphere encephalomalacia with ex vacuo enlargement of the left lateral ventricle. 5. Sequelae of previous left craniectomy with reconstruction. No acute skull fracture identified. Electronically Signed: By: Genevie Ann M.D. On: 05/13/2016 14:43   Echocardiogram   ECG: atrial  Fibrillation    ASSESSMENT AND PLAN  75 year old male post SDH in 2005 requiring craniotomy and bilateral SDH and SAH on 05/04/2016 resulting in aphasia and right sided weakness  1. Chronic persistent atrial fibrillation - well rate controlled on digoxin and metoprolol, patient's wife is advised not to stop his medication as it is controlling his rate. Anticoagulation contraindicated as his prior subdural hematoma and currently admitted with subdural and subarachnoid no hemorrhage. He is also currently not getting any aspirin, at this point I stay away as he has excessive bleeding.  2. Essential hypertension - well controlled on current regimen.  The patient appears euvolemic, we do know his LVEF, I have suggested order an echocardiogram however life denies. The patient appears well compensated from cardiac standpoint we will sign off please call us with any further questions.  Signed, Ena Dawley, MD, [facility] 05/19/2016, 2:19 PM

## 2016-05-19 NOTE — Progress Notes (Signed)
Physical Therapy Note  Patient Details  Name: James [name] MRN: [id number] Date of Birth: 09/28/1940 Today's Date: 05/19/2016    Time: 1330-1357 27 minutes  1:1 No signs/symptoms of pain.  Gait with Rt UE over PT's shoulders with mod A for wt shifts and balance especially with turns to sit. Pt able to gait 3 x 10' mod A.  Seated balance with reaching tasks in all directions with pt requiring mod/max manual facilitation to prevent posterior pelvic tilt and promote pelvic mobility with reaching, min A for seated balance with reaching tasks.  Pt requires mod gesturing and verbal cuing to complete all tasks.   Lauraann Missey 05/19/2016, 1:58 PM

## 2016-05-20 ENCOUNTER — Inpatient Hospital Stay (HOSPITAL_COMMUNITY): Payer: Medicare Other | Admitting: Physical Therapy

## 2016-05-20 ENCOUNTER — Inpatient Hospital Stay (HOSPITAL_COMMUNITY): Payer: Medicare Other | Admitting: Occupational Therapy

## 2016-05-20 ENCOUNTER — Ambulatory Visit (HOSPITAL_COMMUNITY): Payer: Medicare Other | Admitting: Speech Pathology

## 2016-05-20 ENCOUNTER — Other Ambulatory Visit (HOSPITAL_COMMUNITY): Payer: Medicare Other

## 2016-05-20 DIAGNOSIS — I480 Paroxysmal atrial fibrillation: Secondary | ICD-10-CM

## 2016-05-20 DIAGNOSIS — S065X1S Traumatic subdural hemorrhage with loss of consciousness of 30 minutes or less, sequela: Secondary | ICD-10-CM

## 2016-05-20 LAB — BASIC METABOLIC PANEL
Anion gap: 9 (ref 5–15)
BUN: 17 mg/dL (ref 6–20)
CHLORIDE: 94 mmol/L — AB (ref 101–111)
CO2: 25 mmol/L (ref 22–32)
CREATININE: 0.77 mg/dL (ref 0.61–1.24)
Calcium: 8.7 mg/dL — ABNORMAL LOW (ref 8.9–10.3)
Glucose, Bld: 100 mg/dL — ABNORMAL HIGH (ref 65–99)
POTASSIUM: 4.7 mmol/L (ref 3.5–5.1)
SODIUM: 128 mmol/L — AB (ref 135–145)

## 2016-05-20 NOTE — Progress Notes (Signed)
Occupational Therapy Session Note  Patient Details  Name: James [name] MRN: [id number] Date of Birth: 1941-03-07  Today's Date: 05/20/2016 OT Individual Time: 1100-1200 OT Individual Time Calculation (min): 60 min     Short Term Goals: Week 1:  OT Short Term Goal 1 (Week 1): Pt will appropriately use self-care objects with mod instructional cues. OT Short Term Goal 2 (Week 1): Pt will don shirt using hemi-techniques and mod A OT Short Term Goal 3 (Week 1): Pt will complete shower transfer with Mod A OT Short Term Goal 4 (Week 1): Pt will attend to 3 minute ADL task in minimally distractive environment with min cues  Skilled Therapeutic Interventions/Progress Updates:   1:1 self care at shower level. Pt with improved ability to advance right UE in functional ambulation and transfers. Pt able to ambulated into the bathroom from RN station with mod A with SPC; improving the amt of A from yesterday. However still continues to demonstrate impulsiveness and loose his balance anteriorly.  Pt demonstrated more automatic use of right hand in bathing, dressing and grooming tasks without cuing. Pt dressed sitting on BSc with steadying A and extra time; successfully able to thread bilateral Les but required extra time (did dress left side first). Pt required max A to navigate from shower to BSc in bathroom due to suspected right visual field cut requiring multimodal cuing.    13:30-14:15 1:1 cognitive retraining Pt with c/o HA with gestures- asked the RN to come and she provided meds. Engaged in pipe tree tasks to address problem solving, bilateral UE use, attention to right field and attention in moderately distracting enviroment. Pt able to complete 2 diagrams with min cuing with extra time for visual attention to right and problem solving. Pt with less automatic use of right Ue with task. Left with next therapist.   Therapy Documentation Precautions:  Precautions Precautions: Fall Precaution  Comments: residual deficits from 2005 TBI/CVA, impaired vision, R sided weakness Restrictions Weight Bearing Restrictions: No Pain: Pain Assessment Pain Assessment: Faces Faces Pain Scale: Hurts even more Pain Type: Acute pain Pain Location: Generalized Pain Descriptors / Indicators: Aching Pain Onset: On-going Pain Intervention(s): Rest;RN made aware  See Function Navigator for Current Functional Status.   Therapy/Group: Individual Therapy  [name] [facility] 05/20/2016, 3:49 PM

## 2016-05-20 NOTE — Progress Notes (Signed)
Social Work Patient ID: James [name], male   DOB: 07/16/1941, 75 y.o.   MRN: [id number]  Met with wife who is here to observe pt in therapies. She has spoken with Crystal Richardson-SW at [facility] regarding  Bed availability and getting insurance approval for when pt is medically ready to transfer to their skilled unit/rehab. Wife feels he needs to continue his rehab and get to a higher level physically before she can Manage him at home. Awaiting MD to sign FL2 and then will send over to facility to begin the insurance approval.

## 2016-05-20 NOTE — Progress Notes (Signed)
[place] PHYSICAL MEDICINE & REHABILITATION     PROGRESS NOTE  Subjective/Complaints:  Pt laying in bed this AM.  He continues to smile.  He continues to have inconsistent responses. Seen by Cardiology, appreciate recs.   ROS: Unable to assess due to language.  Objective: Vital Signs: Blood pressure (!) 145/88, pulse 71, temperature 98.2 F (36.8 C), temperature source Oral, resp. rate 17, height 5\' 11"  (1.803 m), weight 97.3 kg (214 lb 9.6 oz), SpO2 98 %. No results found.  Recent Labs  05/19/16 0551  WBC 15.1*  HGB 14.1  HCT 41.2  PLT 283    Recent Labs  05/19/16 0551 05/20/16 0729  NA 129* 128*  K 4.1 4.7  CL 93* 94*  GLUCOSE 105* 100*  BUN 13 17  CREATININE 0.60* 0.77  CALCIUM 8.9 8.7*   CBG (last 3)  No results for input(s): GLUCAP in the last 72 hours.  Wt Readings from Last 3 Encounters:  05/18/16 97.3 kg (214 lb 9.6 oz)  05/11/16 90.7 kg (200 lb)    Physical Exam:  BP (!) 145/88 (BP Location: Right Arm)   Pulse 71   Temp 98.2 F (36.8 C) (Oral)   Resp 17   Ht 5\' 11"  (1.803 m)   Wt 97.3 kg (214 lb 9.6 oz)   SpO2 98%   BMI 29.93 kg/m  Constitutional: He appears well-developedand well-nourished. NAD.  Head: Old healed incision on scalp Eyes: Conjunctivaeand EOMare normal.  Cardiovascular: Irregularly, irregular. No JVD. Respiratory: Breath sounds normal. No stridor. No respiratory distress. He has no wheezes.  GI: Soft. Bowel sounds are normal. He exhibits no distension. There is no tenderness.  Musculoskeletal: He exhibits no edemaor tenderness.  Neurological: He is alert.  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  Able to follow simple one step motor commands at times, with visual cues.  Motor (Limited by aphasia, but grossly): RUE: ?3/5 LUE 4+/5 RLE: 1+/5 LLE: 4+/5 Skin: Skin is warmand dry. Ecchymosis left posterior neck, LUE, left flank and left hip, stable.   Assessment/Plan: 1. Functional deficits  secondary to left intraparenchymal hemorrhage, subarachnoid hemorrhage, subdural hemorrhage which require 3+ hours per day of interdisciplinary therapy in a comprehensive inpatient rehab setting. Physiatrist is providing close team supervision and 24 hour management of active medical problems listed below. Physiatrist and rehab team continue to assess barriers to discharge/monitor patient progress toward functional and medical goals.  Function:  Bathing Bathing position   Position: Shower  Bathing parts Body parts bathed by patient: Right arm, Left arm, Chest, Abdomen, Front perineal area, Buttocks, Right upper leg, Left upper leg, Right lower leg, Left lower leg Body parts bathed by helper: Back  Bathing assist Assist Level: Touching or steadying assistance(Pt > 75%)      Upper Body Dressing/Undressing Upper body dressing   What is the patient wearing?: Pull over shirt/dress     Pull over shirt/dress - Perfomed by patient: Thread/unthread left sleeve, Put head through opening, Pull shirt over trunk, Thread/unthread right sleeve Pull over shirt/dress - Perfomed by helper: Thread/unthread right sleeve        Upper body assist Assist Level: Supervision or verbal cues      Lower Body Dressing/Undressing Lower body dressing   What is the patient wearing?: Pants, Shoes (brief)     Pants- Performed by patient: Thread/unthread right pants leg, Thread/unthread left pants leg, Pull pants up/down Pants- Performed by helper: Thread/unthread right pants leg, Pull pants up/down   Non-skid slipper socks-  Performed by helper: Don/doff left sock, Don/doff right sock Socks - Performed by patient: Don/doff left sock Socks - Performed by helper: Don/doff right sock Shoes - Performed by patient: Don/doff left shoe Shoes - Performed by helper: Don/doff right shoe          Lower body assist Assist for lower body dressing: Touching or steadying assistance (Pt > 75%)      Toileting Toileting      Toileting steps completed by helper: Adjust clothing prior to toileting, Performs perineal hygiene, Adjust clothing after toileting Toileting Assistive Devices: Grab bar or rail  Toileting assist Assist level: Touching or steadying assistance (Pt.75%)   Transfers Chair/bed transfer Chair/bed transfer activity did not occur: N/A Chair/bed transfer method: Stand pivot Chair/bed transfer assist level: Moderate assist (Pt 50 - 74%/lift or lower) Chair/bed transfer assistive device: Armrests     Locomotion Ambulation     Max distance: 25 ft Assist level: Maximal assist (Pt 25 - 49%)   Wheelchair   Type: Manual Max wheelchair distance: 100 ft Assist Level: Moderate assistance (Pt 50 - 74%)  Cognition Comprehension Comprehension assist level: Understands basic 50 - 74% of the time/ requires cueing 25 - 49% of the time  Expression Expression assist level: Expresses basis less than 25% of the time/requires cueing >75% of the time.  Social Interaction Social Interaction assist level: Interacts appropriately 25 - 49% of time - Needs frequent redirection.  Problem Solving Problem solving assist level: Solves basic less than 25% of the time - needs direction nearly all the time or does not effectively solve problems and may need a restraint for safety  Memory Memory assist level: Recognizes or recalls less than 25% of the time/requires cueing greater than 75% of the time     Medical Problem List and Plan: 1. Right hemiparesis, aphasia, cognitive dysfunction, gait disorder secondary to left intraparenchymal hemorrhage, subarachnoid hemorrhage, subdural hemorrhage following a fall 05/04/2016  Cont CIR  Repeat Head CT reviewed, spoke with Radiologist, appears to be significant, but stable bleed 2. DVT Prophylaxis/Anticoagulation: Pharmaceutical: Lovenox restarted 3. Pain Management: Hydrocodone PRN.  4. Mood: Monitor for visual and behavioral changes. History of depression  Zoloft d/ced  due to Na+ trending down, will cont to monitor 5. Neuropsych: This patient is not capable of making decisions on hisown behalf. 6. Skin/Wound Care: Routine pressure relief measures 7. Fluids/Electrolytes/Nutrition: Monitor I/O. Appetite reported to be good.  8. PAF/HTN: Monitor blood pressure and heart rate bid. Continue digoxin and Toprol XL.   Appreciate Cards recs, cont current meds, anticoagulation contraindicated due to recurrent bleeds  Cont meds 9. Seizure disorder: On keppra bid.  10. E coli UTI: Treated.  11. Urinary retention: Monitor voiding  Bethanechol 5 TID on 10/24, will adjust as necessary  Toilet every 3-4 hours.  12. History of gout. Monitor for flareup 13. Review of primary care notes indicate has CODE STATUS: DO NOT RESUSCITATE, this was discussed with his wife, she will bring no code order from primary care physician.  14. HTN  Monitor with increased activity  Relatively controlled 15. Hypokalemia: Resolved after suppmentation  4.1 on 10/26 16. Hyponatremia  Na+ 128 on 10/27  Zoloft d/ced 10/27  Cont to monitor  Labs ordered for Monday 17. Recurrent UTI:   Remains Afebrile  WBCs 15.1 on 10/26, ?trending down   Urine neg, CXR 10/21 reviewed, negative for infection  Repeat Ucx with E. Faecalis, macrobid 10/25-11/1  LOS (Days) 8 A FACE TO FACE EVALUATION WAS PERFORMED  Sehaj Kolden Lorie Phenix 05/20/2016 8:57 AM

## 2016-05-20 NOTE — Progress Notes (Signed)
Speech Language Pathology Weekly Progress and Session Note  Patient Details  Name: James [name] MRN: [id number] Date of Birth: Dec 11, 1940  Beginning of progress report period: May 13, 2016   End of progress report period: May 20, 2016  Today's Date: 05/20/2016 SLP Individual Time: 0800-0900 SLP Individual Time Calculation (min): 60 min   Short Term Goals: Week 1: SLP Short Term Goal 1 (Week 1): Pt to identify objects in a field of 2 with min A.  SLP Short Term Goal 1 - Progress (Week 1): Met SLP Short Term Goal 2 (Week 1): Pt communicate wants/needs via any means with max A SLP Short Term Goal 2 - Progress (Week 1): Progressing toward goal SLP Short Term Goal 3 (Week 1): Pt answer  basic Y/N questions with 70% acc and mod A SLP Short Term Goal 3 - Progress (Week 1): Met SLP Short Term Goal 4 (Week 1): Pt to demonstrate oral clearance with trials of Dys 2 with mod A SLP Short Term Goal 4 - Progress (Week 1): Progressing toward goal SLP Short Term Goal 5 (Week 1): Pt to clear oral cavity of thin liquid boluses within 2 seconds with min verbal cueing SLP Short Term Goal 5 - Progress (Week 1): Other (comment) (not address this reporting period due to heavy cues needed for use of swallowing precautions ) SLP Short Term Goal 6 (Week 1): Pt will sustain his attention to basic, familiar tasks for 1 minute with min verbal cues for redirection.  SLP Short Term Goal 6 - Progress (Week 1): Met    New Short Term Goals: Week 2: SLP Short Term Goal 1 (Week 2): Pt will communicate wants/needs via any means with max A SLP Short Term Goal 2 (Week 2): Pt will answer basic Y/N questions with 70% acc and min A SLP Short Term Goal 3 (Week 2): Pt to demonstrate oral clearance with trials of Dys 2 with mod A SLP Short Term Goal 4 (Week 2): Pt to clear oral cavity of thin liquid boluses within 2 seconds with min verbal cueing SLP Short Term Goal 5 (Week 2): Pt will sustain his attention to basic,  familiar tasks for 5 minutes with min verbal cues for redirection.   Weekly Progress Updates:  Pt has made small functional gains this reporting period and has met 4 out of 5 short term goals.  Pt is consuming dys 1 textures and nectar thick liquids with full supervision for use of swallowing precautions due to impulsivity with intake.  Pt is overall max assist for expressive communication but mod assist for receptive language to answer basic yes/no questions and follow 1 step commands.  Pt also presents with moderately severe cognitive impairment characterized by decreased sustained attention to tasks and decreased safety awareness and needs mod-max assist during functional tasks.  As a result, pt would continue to benefit from skilled ST while inpatient in order to maximize functional independence and reduce burden of care prior to discharge.  Pt and family education is ongoing.  Recommend 24/7 supervision at discharge in addition to ST follow up at next level of care.     Intensity: Minumum of 1-2 x/day, 30 to 90 minutes Frequency: 3 to 5 out of 7 days Duration/Length of Stay: pt is pending SNF placement  Treatment/Interventions: Cognitive remediation/compensation;Dysphagia/aspiration precaution training;Speech/Language facilitation;Cueing hierarchy;Patient/family education;Functional tasks;Multimodal communication approach   Daily Session  Skilled Therapeutic Interventions: Pt was seen for skilled ST targeting goals for cognition and dysphagia.  SLP facilitated the session  with max assist multimodal cues for use of swallowing precautions with dys 1 textures and nectar thick liquids.  Pt demonstrated persistent throat clearing well after completion of meal which SLP suspects to be related to pharyngeal residue resulting from large boluses.  Pt also needed max assist for initiation and sequencing of oral care after completion of meal.  Pt was able to sustain his attention to a basic familiar card  game for ~10 minutes in a quiet environment with min verbal cues for redirection.  Pt also needed min verbal cues for task organization during activity.  Pt was left in bed with bed alarm set and call bell within reach.  Goals updated on this date to reflect current progress and plan of care.        Function:   Eating Eating   Modified Consistency Diet: Yes Eating Assist Level: Supervision or verbal cues;Set up assist for;Helper checks for pocketed food;Help with picking up utensils   Eating Set Up Assist For: Opening containers       Cognition Comprehension Comprehension assist level: Understands basic 75 - 89% of the time/ requires cueing 10 - 24% of the time  Expression   Expression assist level: Expresses basic 25 - 49% of the time/requires cueing 50 - 75% of the time. Uses single words/gestures.  Social Interaction Social Interaction assist level: Interacts appropriately 25 - 49% of time - Needs frequent redirection.  Problem Solving Problem solving assist level: Solves basic 25 - 49% of the time - needs direction more than half the time to initiate, plan or complete simple activities  Memory Memory assist level: Recognizes or recalls less than 25% of the time/requires cueing greater than 75% of the time   General    Pain Pain Assessment Pain Assessment: No/denies pain   Therapy/Group: Individual Therapy  Ysenia Filice, Selinda Orion 05/20/2016, 12:30 PM

## 2016-05-20 NOTE — NC FL2 (Signed)
[place] LEVEL OF CARE SCREENING TOOL     IDENTIFICATION  Patient Name: James [name] Birthdate: 05-26-1941 Sex: male Admission Date (Current Location): 05/12/2016  [facility] and [place] Number:  Engineering geologist and Address:  [facility], [place] [address], [place], [place] [address]      Provider Number: [id number]  Attending Physician Name and Address:  Ankit Lorie Phenix, MD  Relative Name and Phone Number:  Conard Novak [phone]    Current Level of Care: Other (Comment) (rehab) Recommended Level of Care:   Prior Approval Number:    Date Approved/Denied:   PASRR Number: [id number]  Discharge Plan: SNF    Current Diagnoses: Patient Active Problem List   Diagnosis Date Noted  . PAF (paroxysmal atrial fibrillation) ([place])   . Recurrent UTI   . Leukocytosis   . Hypokalemia   . Hyponatremia   . Abnormal urine   . 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 Benign essential HTN   . Urinary retention   . History of gout   . Global aphasia   . SAH (subarachnoid hemorrhage) ([place])   . SDH (subdural hematoma) ([place])   . Lymphocytosis   . Subdural hemorrhage following injury ([place]) 05/12/2016    Orientation RESPIRATION BLADDER Height & Weight     Self, Situation, Place  Normal Incontinent, External catheter (If no void cath every 6-8 hours) Weight: 214 lb 9.6 oz (97.3 kg) Height:  5\' 11"  (180.3 cm)  BEHAVIORAL SYMPTOMS/MOOD NEUROLOGICAL BOWEL NUTRITION STATUS      Continent Diet (Dys 3 thin liquids)  AMBULATORY STATUS COMMUNICATION OF NEEDS Skin   Limited Assist Verbally Normal                       Personal Care Assistance Level of Assistance  Bathing, Dressing, Feeding Bathing Assistance: Limited assistance Feeding assistance: Limited assistance Dressing Assistance: Limited assistance     Functional Limitations Therapist, nutritional, Speech Sight Info: Impaired   Speech Info: Impaired     SPECIAL CARE FACTORS FREQUENCY  PT (By licensed PT), OT (By licensed OT), Speech therapy     PT Frequency: 5 x week OT Frequency: 5 x week     Speech Therapy Frequency: 5 x week      Contractures Contractures Info: Not present    Additional Factors Info  Code Status Code Status Info: DNR             Current Medications (05/20/2016):  This is the current hospital active medication list Current Facility-Administered Medications  Medication Dose Route Frequency Provider Last Rate Last Dose  . acetaminophen (TYLENOL) tablet 325-650 mg  325-650 mg Oral Q4H PRN Bary Leriche, PA-C   650 mg at 05/15/16 2140  . alum & mag hydroxide-simeth (MAALOX/MYLANTA) 200-200-20 MG/5ML suspension 30 mL  30 mL Oral Q4H PRN Ivan Anchors Love, PA-C      . bethanechol (URECHOLINE) tablet 5 mg  5 mg Oral TID Ankit Lorie Phenix, MD   5 mg at 05/20/16 0937  . bisacodyl (DULCOLAX) suppository 10 mg  10 mg Rectal Daily PRN Bary Leriche, PA-C   10 mg at 05/19/16 2340  . digoxin (LANOXIN) tablet 0.125 mg  0.125 mg Oral Daily Bary Leriche, PA-C   0.125 mg at 05/20/16 [id number]  . diphenhydrAMINE (BENADRYL) 12.5 MG/5ML elixir 12.5-25 mg  12.5-25 mg Oral Q6H PRN Bary Leriche, PA-C      .  enoxaparin (LOVENOX) injection 40 mg  40 mg Subcutaneous Q24H Ankit Lorie Phenix, MD   40 mg at 05/20/16 0936  . famotidine (PEPCID) tablet 20 mg  20 mg Oral BID Bary Leriche, PA-C   20 mg at 05/20/16 [id number]  . guaiFENesin-dextromethorphan (ROBITUSSIN DM) 100-10 MG/5ML syrup 5-10 mL  5-10 mL Oral Q6H PRN Bary Leriche, PA-C      . HYDROcodone-acetaminophen (NORCO/VICODIN) 5-325 MG per tablet 1 tablet  1 tablet Oral Q6H PRN Bary Leriche, PA-C   1 tablet at 05/18/16 1729  . levETIRAcetam (KEPPRA) 100 MG/ML solution 500 mg  500 mg Oral BID Theone Murdoch Hammons, RPH   500 mg at 05/20/16 0936  . lidocaine (XYLOCAINE) 2 % jelly   Topical PRN Bary Leriche, PA-C      . lidocaine (XYLOCAINE) 2 % jelly   Topical PRN Lavon Paganini Angiulli, PA-C       . methocarbamol (ROBAXIN) tablet 500 mg  500 mg Oral Q6H PRN Bary Leriche, PA-C      . metoprolol (LOPRESSOR) tablet 50 mg  50 mg Oral BID Ankit Lorie Phenix, MD   50 mg at 05/20/16 0937  . nitrofurantoin (macrocrystal-monohydrate) (MACROBID) capsule 100 mg  100 mg Oral Q12H Ankit Lorie Phenix, MD   100 mg at 05/20/16 0937  . prochlorperazine (COMPAZINE) tablet 5-10 mg  5-10 mg Oral Q6H PRN Bary Leriche, PA-C       Or  . prochlorperazine (COMPAZINE) injection 5-10 mg  5-10 mg Intramuscular Q6H PRN Bary Leriche, PA-C       Or  . prochlorperazine (COMPAZINE) suppository 12.5 mg  12.5 mg Rectal Q6H PRN Bary Leriche, PA-C      . RESOURCE THICKENUP CLEAR   Oral PRN Ankit Lorie Phenix, MD      . simvastatin (ZOCOR) tablet 40 mg  40 mg Oral q1800 Ivan Anchors Love, PA-C   40 mg at 05/19/16 1759  . sodium phosphate (FLEET) 7-19 GM/118ML enema 1 enema  1 enema Rectal Once PRN Ivan Anchors Love, PA-C      . sorbitol 70 % solution 30 mL  30 mL Oral Daily PRN Lavon Paganini Angiulli, PA-C   30 mL at 05/19/16 0742  . traZODone (DESYREL) tablet 25-50 mg  25-50 mg Oral QHS PRN Bary Leriche, PA-C   50 mg at 05/19/16 [id number]     Discharge Medications: Please see discharge summary for a list of discharge medications.  Relevant Imaging Results:  Relevant Lab Results:   Additional Information SSN: [id number]  Seaborn Nakama, Gardiner Rhyme, LCSW

## 2016-05-21 ENCOUNTER — Inpatient Hospital Stay (HOSPITAL_COMMUNITY): Payer: Medicare Other | Admitting: Speech Pathology

## 2016-05-21 ENCOUNTER — Inpatient Hospital Stay (HOSPITAL_COMMUNITY): Payer: Medicare Other | Admitting: *Deleted

## 2016-05-21 DIAGNOSIS — S065X2S Traumatic subdural hemorrhage with loss of consciousness of 31 minutes to 59 minutes, sequela: Secondary | ICD-10-CM

## 2016-05-21 NOTE — Progress Notes (Signed)
Physical Therapy Session Note  Patient Details  Name: James [name] MRN: [id number] Date of Birth: 06-Sep-1940  Today's Date: 05/21/2016 PT Individual Time: 1000-1045 PT Individual Time Calculation (min): 45 min     Skilled Therapeutic Interventions/Progress Updates:  Patient in bed at the beginning of the session, attempts to get up on his own, needs increased cues to assure safety. Donned shorts in sitting and pulled in standing weigth modA  For balance. Gait Training from room to therapy gym with [facility] with min A and cues for posture step length and attention to R side.  NuStep for strength and reciprocal facilitation 1 x 10 min with resistance set at level 6.  Balance and standing activity training with use of R hand to reach for horse shoes and place them on basketball ring. Patient completed 3 x 1 min activities, needs increased cues to actively engage R side.  At the end ambulated back tp room with min A, wife in room. Patient left sitting in recliner with full lap trey on.    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   Therapy/Group: Individual Therapy  [name] 05/21/2016, 12:24 PM

## 2016-05-21 NOTE — Progress Notes (Signed)
[place] PHYSICAL MEDICINE & REHABILITATION     PROGRESS NOTE  Subjective/Complaints:  Lying in bed. Eyes open. No distress  ROS: Unable to assess due to language  Objective: Vital Signs: Blood pressure 112/63, pulse 74, temperature 98.9 F (37.2 C), temperature source Oral, resp. rate 18, height 5\' 11"  (1.803 m), weight 97.3 kg (214 lb 9.6 oz), SpO2 100 %. No results found.  Recent Labs  05/19/16 0551  WBC 15.1*  HGB 14.1  HCT 41.2  PLT 283    Recent Labs  05/19/16 0551 05/20/16 0729  NA 129* 128*  K 4.1 4.7  CL 93* 94*  GLUCOSE 105* 100*  BUN 13 17  CREATININE 0.60* 0.77  CALCIUM 8.9 8.7*   CBG (last 3)  No results for input(s): GLUCAP in the last 72 hours.  Wt Readings from Last 3 Encounters:  05/18/16 97.3 kg (214 lb 9.6 oz)  05/11/16 90.7 kg (200 lb)    Physical Exam:  BP 112/63   Pulse 74   Temp 98.9 F (37.2 C) (Oral)   Resp 18   Ht 5\' 11"  (1.803 m)   Wt 97.3 kg (214 lb 9.6 oz)   SpO2 100%   BMI 29.93 kg/m  Constitutional: He appears well-developedand well-nourished. NAD.  Head: Old healed incision on scalp Eyes: Conjunctivaeand EOMare normal.  Cardiovascular: Irregularly, irregular. No JVD. Respiratory: Breath sounds normal. No stridor. No respiratory distress. He has no wheezes.  GI: Soft. Bowel sounds are normal. He exhibits no distension. There is no tenderness  Musculoskeletal: He exhibits no edemaor tenderness.  Neurological: He is alert.  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  Able to follow simple one step motor commands at times, with visual cues--stable  Motor (Limited by aphasia, but grossly): RUE: ?3/5 LUE 4+/5 RLE: 1+/5 LLE: 4+/5 Skin: Skin is warmand dry. Ecchymosis left posterior neck, LUE, left flank and left hip, no change.   Assessment/Plan: 1. Functional deficits secondary to left intraparenchymal hemorrhage, subarachnoid hemorrhage, subdural hemorrhage which require 3+ hours per day of  interdisciplinary therapy in a comprehensive inpatient rehab setting. Physiatrist is providing close team supervision and 24 hour management of active medical problems listed below. Physiatrist and rehab team continue to assess barriers to discharge/monitor patient progress toward functional and medical goals.  Function:  Bathing Bathing position   Position: Shower  Bathing parts Body parts bathed by patient: Right arm, Left arm, Chest, Abdomen, Front perineal area, Right upper leg, Left upper leg, Right lower leg, Left lower leg Body parts bathed by helper: Buttocks, Back  Bathing assist Assist Level: Touching or steadying assistance(Pt > 75%)      Upper Body Dressing/Undressing Upper body dressing   What is the patient wearing?: Pull over shirt/dress     Pull over shirt/dress - Perfomed by patient: Thread/unthread left sleeve, Put head through opening, Pull shirt over trunk, Thread/unthread right sleeve Pull over shirt/dress - Perfomed by helper: Thread/unthread right sleeve        Upper body assist Assist Level: Supervision or verbal cues      Lower Body Dressing/Undressing Lower body dressing   What is the patient wearing?: Pants, Shoes, Socks     Pants- Performed by patient: Pull pants up/down, Thread/unthread right pants leg, Thread/unthread left pants leg Pants- Performed by helper: Thread/unthread right pants leg, Thread/unthread left pants leg   Non-skid slipper socks- Performed by helper: Don/doff right sock, Don/doff left sock Socks - Performed by patient: Don/doff right sock, Don/doff left sock Socks -  Performed by helper: Don/doff right sock, Don/doff left sock Shoes - Performed by patient: Don/doff right shoe, Don/doff left shoe Shoes - Performed by helper: Don/doff right shoe, Don/doff left shoe          Lower body assist Assist for lower body dressing: Touching or steadying assistance (Pt > 75%)      Toileting Toileting   Toileting steps completed by  patient: Adjust clothing prior to toileting, Adjust clothing after toileting Toileting steps completed by helper: Performs perineal hygiene Toileting Assistive Devices: Grab bar or rail  Toileting assist Assist level: Touching or steadying assistance (Pt.75%)   Transfers Chair/bed transfer Chair/bed transfer activity did not occur: N/A Chair/bed transfer method: Stand pivot, Ambulatory Chair/bed transfer assist level: Touching or steadying assistance (Pt > 75%) Chair/bed transfer assistive device: Armrests     Locomotion Ambulation     Max distance: 200 ft Assist level: Moderate assist (Pt 50 - 74%)   Wheelchair   Type: Manual Max wheelchair distance: 100 ft Assist Level: Moderate assistance (Pt 50 - 74%)  Cognition Comprehension Comprehension assist level: Understands basic 75 - 89% of the time/ requires cueing 10 - 24% of the time  Expression Expression assist level: Expresses basic 25 - 49% of the time/requires cueing 50 - 75% of the time. Uses single words/gestures.  Social Interaction Social Interaction assist level: Interacts appropriately 25 - 49% of time - Needs frequent redirection.  Problem Solving Problem solving assist level: Solves basic 25 - 49% of the time - needs direction more than half the time to initiate, plan or complete simple activities  Memory Memory assist level: Recognizes or recalls less than 25% of the time/requires cueing greater than 75% of the time     Medical Problem List and Plan: 1. Right hemiparesis, aphasia, cognitive dysfunction, gait disorder secondary to left intraparenchymal hemorrhage, subarachnoid hemorrhage, subdural hemorrhage following a fall 05/04/2016  Cont CIR   2. DVT Prophylaxis/Anticoagulation: Pharmaceutical: Lovenox restarted 3. Pain Management: Hydrocodone PRN.  4. Mood: Monitor for visual and behavioral changes. History of depression  Zoloft d/ced due to Na+ trending down, will cont to monitor 5. Neuropsych: This patient  is not capable of making decisions on hisown behalf. 6. Skin/Wound Care: Routine pressure relief measures 7. Fluids/Electrolytes/Nutrition: Monitor I/O. Appetite reported to be good.  8. PAF/HTN: Monitor blood pressure and heart rate bid. Continue digoxin and Toprol XL.   Appreciate Cards recs, cont current meds, anticoagulation contraindicated due to recurrent bleeds  Cont meds 9. Seizure disorder: On keppra bid.  10. E coli UTI: Treated.  11. Urinary retention: Monitor voiding  Bethanechol 5 TID on 10/24--continue  Toilet every 3-4 hours.  12. History of gout. Monitor for flareup 13. Review of primary care notes indicate has CODE STATUS: DO NOT RESUSCITATE, this was discussed with his wife, she will bring no code order from primary care physician.  14. HTN  Monitor with increased activity  Relatively controlled 15. Hypokalemia: Resolved after suppmentation  4.1 on 10/26 16. Hyponatremia  Na+ 128 on 10/27  Zoloft d/ced 10/27  Cont to monitor  Labs pending for Monday 17. Recurrent UTI:   Remains Afebrile  WBCs 15.1 on 10/26, ?trending down   Urine neg, CXR 10/21 reviewed, negative for infection  Repeat Ucx with E. Faecalis, macrobid 10/25-11/1  LOS (Days) 9 A FACE TO FACE EVALUATION WAS PERFORMED  James [name] 05/21/2016 9:47 AM

## 2016-05-21 NOTE — Progress Notes (Signed)
Speech Language Pathology Daily Session Note  Patient Details  Name: James [name] MRN: [id number] Date of Birth: Oct 07, 1940  Today's Date: 05/21/2016 SLP Individual Time: [id number] SLP Individual Time Calculation (min): 40 min   Short Term Goals: Week 2: SLP Short Term Goal 1 (Week 2): Pt will communicate wants/needs via any means with max A SLP Short Term Goal 2 (Week 2): Pt will answer basic Y/N questions with 70% acc and min A SLP Short Term Goal 3 (Week 2): Pt to demonstrate oral clearance with trials of Dys 2 with mod A SLP Short Term Goal 4 (Week 2): Pt to clear oral cavity of thin liquid boluses within 2 seconds with min verbal cueing SLP Short Term Goal 5 (Week 2): Pt will sustain his attention to basic, familiar tasks for 5 minutes with min verbal cues for redirection.   Skilled Therapeutic Interventions:  Pt was seen for skilled ST targeting goals for dysphagia and cognition.  SLP facilitated the session with trials of regular unthickened water following oral care to continue working towards diet progression.  Pt demonstrated improved rate and portion control with intake today in comparison to previous therapy sessions with min assist verbal cues.  Pt consumed thin liquids with x2 instances of immediate coughing which SLP suspects to be related to increased rate as slower more controlled cup sips were tolerated without overt s/s of aspiration.  Recommend continuing with sips of thin liquids with SLP only to continue working towards diet progression.  Pt was able to identify written biographical, orientation, and environmental information from a choice of two for ~75% accuracy with mod assist verbal cues for correction of errors.  Pt was even able to verbalize at the word level to say biographical information (birthday) with max assist sentence completion cues.  Pt was handed off to nurse tech in [place] with quick release belt donned and lap tray in place.  Continue per current plan of  care.     Function:  Eating Eating   Modified Consistency Diet: Yes Eating Assist Level: Supervision or verbal cues;Set up assist for;Help managing cup/glass;Helper checks for pocketed food   Eating Set Up Assist For: Opening containers       Cognition Comprehension Comprehension assist level: Understands basic 50 - 74% of the time/ requires cueing 25 - 49% of the time  Expression   Expression assist level: Expresses basic 25 - 49% of the time/requires cueing 50 - 75% of the time. Uses single words/gestures.  Social Interaction Social Interaction assist level: Interacts appropriately 25 - 49% of time - Needs frequent redirection.  Problem Solving Problem solving assist level: Solves basic 25 - 49% of the time - needs direction more than half the time to initiate, plan or complete simple activities  Memory Memory assist level: Recognizes or recalls less than 25% of the time/requires cueing greater than 75% of the time    Pain Pain Assessment Pain Assessment: No/denies pain  Therapy/Group: Individual Therapy  [name], Selinda Orion 05/21/2016, 4:26 PM

## 2016-05-22 ENCOUNTER — Inpatient Hospital Stay (HOSPITAL_COMMUNITY): Payer: Medicare Other

## 2016-05-22 NOTE — Progress Notes (Signed)
Occupational Therapy Session Note  Patient Details  Name: James [name] MRN: [id number] Date of Birth: 11/24/1940  Today's Date: 05/22/2016 OT Individual Time: 1430-1500 OT Individual Time Calculation (min): 30 min   Short Term Goals: Week 1:  OT Short Term Goal 1 (Week 1): Pt will appropriately use self-care objects with mod instructional cues. OT Short Term Goal 2 (Week 1): Pt will don shirt using hemi-techniques and mod A OT Short Term Goal 3 (Week 1): Pt will complete shower transfer with Mod A OT Short Term Goal 4 (Week 1): Pt will attend to 3 minute ADL task in minimally distractive environment with min cues  Skilled Therapeutic Interventions/Progress Updates:   ADL-retraining with focus on use of self-care objects presented during grooming at sink, doffing and donning shirt using hemi technique, and functional transfers and mobility using single point cane.   Pt received supine in bed with RN attending to his needs.   With use of simple commands pt agreed to exit bed and groom at sink for assisted with shaving.   Pt completed bed mobility and stand pivot transfer to wc/ after setup to adjust HOB, place w/c beside bed, and provide simple cues (hand gestures) to proceed.   As OT setup for shaving task, pt initiated doffing his T-shirt and then brushed his hair when presented hair brush.   Pt then washed his face when presented with wash cloth and responded to vc to position hs head and neck as OT completed shaving task for pt.    After shaving pt donned t-shirt with both hands and only setup assist.  Pt then ambulated to bed with his can and min assist for gait/balance.   Pt then transferred back to bed with only SBA for safety, completing bed mobility unassisted.   Call button placed within reach and bed alarm set by OT at end of session.  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   Therapy/Group: Individual Therapy  [name] 05/22/2016, 3:00 PM

## 2016-05-22 NOTE — Progress Notes (Addendum)
Patient got out of the bed at around 3 am, bed alarm sounded off,  NT Ciera was assisting rn in 4w19, ran to check on him and saw patient walking/ "running" in his room. Patient assisted to sit on the chair with a lap tray and brought to the nurse's station. Will continue to monitor patient.

## 2016-05-22 NOTE — Progress Notes (Signed)
[facility] PHYSICAL MEDICINE & REHABILITATION     PROGRESS NOTE  Subjective/Complaints:  Up last night, confused and walking around his room. At the nurses station when I arrived today.  ROS: Unable to assess due to language  Objective: Vital Signs: Blood pressure 125/71, pulse 63, temperature 98.3 F (36.8 C), temperature source Oral, resp. rate 18, height 5\' 11"  (1.803 m), weight 97.3 kg (214 lb 9.6 oz), SpO2 100 %. No results found. No results for input(s): WBC, HGB, HCT, PLT in the last 72 hours.  Recent Labs  05/20/16 0729  NA 128*  K 4.7  CL 94*  GLUCOSE 100*  BUN 17  CREATININE 0.77  CALCIUM 8.7*   CBG (last 3)  No results for input(s): GLUCAP in the last 72 hours.  Wt Readings from Last 3 Encounters:  05/18/16 97.3 kg (214 lb 9.6 oz)  05/11/16 90.7 kg (200 lb)    Physical Exam:  BP 125/71   Pulse 63   Temp 98.3 F (36.8 C) (Oral)   Resp 18   Ht 5\' 11"  (1.803 m)   Wt 97.3 kg (214 lb 9.6 oz)   SpO2 100%   BMI 29.93 kg/m  Constitutional: He appears well-developedand well-nourished. NAD.  Head: Old healed incision on scalp Eyes: Conjunctivaeand EOMare normal.  Cardiovascular: Irregularly, irregular. No JVD. Respiratory: Breath sounds normal. No stridor. No respiratory distress. He has no wheezes.  GI: Soft. Bowel sounds are normal. He exhibits no distension. There is no tenderness  Musculoskeletal: He exhibits no edemaor tenderness.  Neurological: He is alert.  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  Able to follow simple one step motor commands at times, with visual cues--stable  Motor (Limited by aphasia, but grossly): RUE: ?3/5 LUE 4+/5 RLE: 1+/5 LLE: 4+/5 Skin: Skin is warmand dry. Ecchymosis left posterior neck, LUE, left flank and left hip, stable to improved.    Assessment/Plan: 1. Functional deficits secondary to left intraparenchymal hemorrhage, subarachnoid hemorrhage, subdural hemorrhage which require 3+  hours per day of interdisciplinary therapy in a comprehensive inpatient rehab setting. Physiatrist is providing close team supervision and 24 hour management of active medical problems listed below. Physiatrist and rehab team continue to assess barriers to discharge/monitor patient progress toward functional and medical goals.  Function:  Bathing Bathing position   Position: Shower  Bathing parts Body parts bathed by patient: Right arm, Left arm, Chest, Abdomen, Front perineal area, Right upper leg, Left upper leg, Right lower leg, Left lower leg Body parts bathed by helper: Buttocks, Back  Bathing assist Assist Level: Touching or steadying assistance(Pt > 75%)      Upper Body Dressing/Undressing Upper body dressing   What is the patient wearing?: Pull over shirt/dress     Pull over shirt/dress - Perfomed by patient: Thread/unthread left sleeve, Put head through opening, Pull shirt over trunk, Thread/unthread right sleeve Pull over shirt/dress - Perfomed by helper: Thread/unthread right sleeve        Upper body assist Assist Level: Supervision or verbal cues      Lower Body Dressing/Undressing Lower body dressing   What is the patient wearing?: Pants, Shoes, Socks     Pants- Performed by patient: Pull pants up/down, Thread/unthread right pants leg, Thread/unthread left pants leg Pants- Performed by helper: Thread/unthread right pants leg, Thread/unthread left pants leg   Non-skid slipper socks- Performed by helper: Don/doff right sock, Don/doff left sock Socks - Performed by patient: Don/doff right sock, Don/doff left sock Socks - Performed by helper:  Don/doff right sock, Don/doff left sock Shoes - Performed by patient: Don/doff right shoe, Don/doff left shoe Shoes - Performed by helper: Don/doff right shoe, Don/doff left shoe          Lower body assist Assist for lower body dressing: Touching or steadying assistance (Pt > 75%)      Toileting Toileting   Toileting  steps completed by patient: Adjust clothing prior to toileting, Adjust clothing after toileting Toileting steps completed by helper: Performs perineal hygiene Toileting Assistive Devices: Grab bar or rail  Toileting assist Assist level: Touching or steadying assistance (Pt.75%)   Transfers Chair/bed transfer Chair/bed transfer activity did not occur: N/A Chair/bed transfer method: Stand pivot, Ambulatory Chair/bed transfer assist level: Touching or steadying assistance (Pt > 75%) Chair/bed transfer assistive device: Armrests     Locomotion Ambulation     Max distance: 200 ft Assist level: Moderate assist (Pt 50 - 74%)   Wheelchair   Type: Manual Max wheelchair distance: 100 ft Assist Level: Moderate assistance (Pt 50 - 74%)  Cognition Comprehension Comprehension assist level: Understands basic 50 - 74% of the time/ requires cueing 25 - 49% of the time  Expression Expression assist level: Expresses basic 25 - 49% of the time/requires cueing 50 - 75% of the time. Uses single words/gestures.  Social Interaction Social Interaction assist level: Interacts appropriately 25 - 49% of time - Needs frequent redirection.  Problem Solving Problem solving assist level: Solves basic 25 - 49% of the time - needs direction more than half the time to initiate, plan or complete simple activities  Memory Memory assist level: Recognizes or recalls less than 25% of the time/requires cueing greater than 75% of the time     Medical Problem List and Plan: 1. Right hemiparesis, aphasia, cognitive dysfunction, gait disorder secondary to left intraparenchymal hemorrhage, subarachnoid hemorrhage, subdural hemorrhage following a fall 05/04/2016  Cont CIR   2. DVT Prophylaxis/Anticoagulation: Pharmaceutical: Lovenox restarted 3. Pain Management: Hydrocodone PRN.  4. Mood: Monitor for visual and behavioral changes. History of depression  Zoloft d/ced due to Na+ trending down, will cont to monitor 5.  Neuropsych: This patient is not capable of making decisions on hisown behalf. 6. Skin/Wound Care: Routine pressure relief measures 7. Fluids/Electrolytes/Nutrition: Monitor I/O. Appetite reported to be good.  -recheck sodium/BMET tomorrow  8. PAF/HTN: Monitor blood pressure and heart rate bid. Continue digoxin and Toprol XL.   Appreciate Cards recs, cont current meds, anticoagulation contraindicated due to recurrent bleeds  Cont meds 9. Seizure disorder: On keppra bid.  10. E coli UTI: Treated.  11. Urinary retention: Monitor voiding  Bethanechol 5 TID on 10/24--continue  Toilet every 3-4 hours.  12. History of gout. Monitor for flareup 13. Review of primary care notes indicate has CODE STATUS: DO NOT RESUSCITATE, this was discussed with his wife, she will bring no code order from primary care physician.  14. HTN  Monitor with increased activity  Relatively controlled 15. Hypokalemia: repleted  4.1 on 10/26 16. Hyponatremia  Na+ 128 on 10/27  Zoloft d/ced 10/27  Cont to monitor  Labs pending for Monday 17. Recurrent UTI:   Remains Afebrile  WBCs 15.1 on 10/26, ?trending down   Urine neg, CXR 10/21 reviewed, negative for infection  Repeat Ucx with E. Faecalis, macrobid 10/25-11/1  LOS (Days) 10 A FACE TO FACE EVALUATION WAS PERFORMED  James [name] T 05/22/2016 12:07 PM

## 2016-05-23 ENCOUNTER — Inpatient Hospital Stay (HOSPITAL_COMMUNITY): Payer: Medicare Other | Admitting: Physical Therapy

## 2016-05-23 ENCOUNTER — Inpatient Hospital Stay (HOSPITAL_COMMUNITY): Payer: Medicare Other | Admitting: Speech Pathology

## 2016-05-23 ENCOUNTER — Inpatient Hospital Stay (HOSPITAL_COMMUNITY): Payer: Medicare Other | Admitting: Occupational Therapy

## 2016-05-23 LAB — CBC WITH DIFFERENTIAL/PLATELET
BASOS ABS: 0 10*3/uL (ref 0.0–0.1)
BASOS PCT: 0 %
EOS ABS: 0.1 10*3/uL (ref 0.0–0.7)
EOS PCT: 1 %
HCT: 42.8 % (ref 39.0–52.0)
Hemoglobin: 14.9 g/dL (ref 13.0–17.0)
LYMPHS PCT: 16 %
Lymphs Abs: 1.7 10*3/uL (ref 0.7–4.0)
MCH: 28.6 pg (ref 26.0–34.0)
MCHC: 34.8 g/dL (ref 30.0–36.0)
MCV: 82.1 fL (ref 78.0–100.0)
MONO ABS: 1.4 10*3/uL — AB (ref 0.1–1.0)
Monocytes Relative: 13 %
Neutro Abs: 7.2 10*3/uL (ref 1.7–7.7)
Neutrophils Relative %: 70 %
PLATELETS: 276 10*3/uL (ref 150–400)
RBC: 5.21 MIL/uL (ref 4.22–5.81)
RDW: 12.8 % (ref 11.5–15.5)
WBC: 10.3 10*3/uL (ref 4.0–10.5)

## 2016-05-23 LAB — BASIC METABOLIC PANEL
Anion gap: 10 (ref 5–15)
BUN: 9 mg/dL (ref 6–20)
CALCIUM: 9 mg/dL (ref 8.9–10.3)
CO2: 26 mmol/L (ref 22–32)
CREATININE: 0.62 mg/dL (ref 0.61–1.24)
Chloride: 90 mmol/L — ABNORMAL LOW (ref 101–111)
GFR calc Af Amer: >60 mL/min (ref >60)
GLUCOSE: 108 mg/dL — AB (ref 65–99)
Potassium: 4.8 mmol/L (ref 3.5–5.1)
SODIUM: 126 mmol/L — AB (ref 135–145)

## 2016-05-23 NOTE — Progress Notes (Signed)
Physical Therapy Session Note  Patient Details  Name: James [name] MRN: [id number] Date of Birth: [birth year]  Today's Date: 05/23/2016 PT Individual Time: 0804-0905 PT Individual Time Calculation (min): 61 min    Short Term Goals: Week 2:  PT Short Term Goal 1 (Week 2): = LTGs due to anticipated LOS  Skilled Therapeutic Interventions/Progress Updates:    Pt received asleep in bed but easily awakened to therapist's voice. Pt agreeable to tx denying c/o pain. Pt transferred supine>sitting EOB with bed rails & supervision; while sitting EOB pt required verbal cuing to remove slipper socks to don regular socks & shoes with pt requiring min assist for R shoe. Pt's breakfast arrived and therapist provided supervision for meal. Pt required max cuing for small bites/sips and to swallow between each. Therapist requested pt utilize RUE to eat for forced neuro re-ed. At times pt required hand over hand with RUE to place foot on utensil and assistance for positioning utensil in hand. Pt with R inattention/neglect while eating as he dropped utensil on floor twice and would require cuing to let go of objects with RUE. Pt then performed hand hygiene standing at sink with cuing for sequencing and steady assist for standing balance. Gait training x 100 ft + 100 ft room<>BI gym with SPC and min assist with pt demonstrating inconsistent step length & R inattention requiring cuing for directions & obstacle avoidance. Utilized dynavision in standing with LUE to address R inattention, pt with significantly delayed reaction times to R side of board with lower quadrant being the longest (2.84 seconds R upper quadrant, 3.4 seconds R lower quadrant compared to 1.36 seconds and 1.92 seconds on L upper & lower quadrants respectively). During 2nd use of dynavision, instructed pt to utilize RUE only for forced neuro re-ed and pt with continued delayed reaction times to R side of board. At end of session pt left sitting in  geri-chair at nurses station with [place] in place.   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   Therapy/Group: Individual Therapy  Waunita Schooner 05/23/2016, 9:13 AM

## 2016-05-23 NOTE — Progress Notes (Signed)
Speech Language Pathology Daily Session Note  Patient Details  Name: James [name] MRN: [id number] Date of Birth: Nov 13, 1940  Today's Date: 05/23/2016 SLP Individual Time: 1410-1500 SLP Individual Time Calculation (min): 50 min   Short Term Goals: Week 2: SLP Short Term Goal 1 (Week 2): Pt will communicate wants/needs via any means with max A SLP Short Term Goal 2 (Week 2): Pt will answer basic Y/N questions with 70% acc and min A SLP Short Term Goal 3 (Week 2): Pt to demonstrate oral clearance with trials of Dys 2 with mod A SLP Short Term Goal 4 (Week 2): Pt to clear oral cavity of thin liquid boluses within 2 seconds with min verbal cueing SLP Short Term Goal 5 (Week 2): Pt will sustain his attention to basic, familiar tasks for 5 minutes with min verbal cues for redirection.   Skilled Therapeutic Interventions:  Pt was seen for skilled ST targeting cognitive and dysphagia goals.  SLP facilitated the session with a trial of thin liquids following oral care to work towards readiness for repeat objective assessment.  Pt needs max assist for rate and portion control and demonstrated intermittent wet vocal quality.  Recommend ongoing trials of thin liquids with SLP only.  Therapist facilitated the session with a basic money sorting task targeting goals for sustained attention and diagnostic treatment of functional problem solving.  Pt was able to sort coins into groups by value with mod assist multimodal cues but needed max to total assist to generate values of coins when named.  Pt needed min verbal cues to apply brakes before transferring out of wheelchair at the end of today's therapy session.  Pt handed off in bed to primary OT.  Continue per current plan of care.    Function:  Eating Eating   Modified Consistency Diet: Yes Eating Assist Level: Supervision or verbal cues;Help managing cup/glass   Eating Set Up Assist For: Opening containers Helper Scoops Food on Utensil: Occasionally      Cognition Comprehension Comprehension assist level: Understands basic 50 - 74% of the time/ requires cueing 25 - 49% of the time  Expression   Expression assist level: Expresses basic 25 - 49% of the time/requires cueing 50 - 75% of the time. Uses single words/gestures.  Social Interaction Social Interaction assist level: Interacts appropriately 25 - 49% of time - Needs frequent redirection.  Problem Solving Problem solving assist level: Solves basic 25 - 49% of the time - needs direction more than half the time to initiate, plan or complete simple activities  Memory Memory assist level: Recognizes or recalls less than 25% of the time/requires cueing greater than 75% of the time    Pain Pain Assessment Pain Assessment: No/denies pain Faces Pain Scale: No hurt  Therapy/Group: Individual Therapy  Briauna Gilmartin, Selinda Orion 05/23/2016, 5:25 PM

## 2016-05-23 NOTE — Progress Notes (Signed)
[place] PHYSICAL MEDICINE & REHABILITATION     PROGRESS NOTE  Subjective/Complaints:  Pt laying in bed this AM.  Inconsistently following commands.  No reported issues overnight.   ROS: Unable to assess due to language  Objective: Vital Signs: Blood pressure 128/78, pulse 78, temperature 98.9 F (37.2 C), temperature source Oral, resp. rate 18, height 5\' 11"  (1.803 m), weight 97.3 kg (214 lb 9.6 oz), SpO2 100 %. No results found.  Recent Labs  05/23/16 0821  WBC 10.3  HGB 14.9  HCT 42.8  PLT 276    Recent Labs  05/23/16 0821  NA 126*  K 4.8  CL 90*  GLUCOSE 108*  BUN 9  CREATININE 0.62  CALCIUM 9.0   CBG (last 3)  No results for input(s): GLUCAP in the last 72 hours.  Wt Readings from Last 3 Encounters:  05/18/16 97.3 kg (214 lb 9.6 oz)  05/11/16 90.7 kg (200 lb)    Physical Exam:  BP 128/78 (BP Location: Right Arm)   Pulse 78   Temp 98.9 F (37.2 C) (Oral)   Resp 18   Ht 5\' 11"  (1.803 m)   Wt 97.3 kg (214 lb 9.6 oz)   SpO2 100%   BMI 29.93 kg/m  Constitutional: He appears well-developedand well-nourished. NAD.  Head: Old healed incision on scalp Eyes: Conjunctivaeand EOMare normal.  Cardiovascular: Irregularly, irregular. No JVD. Respiratory: Breath sounds normal. No stridor. No respiratory distress. He has no wheezes.  GI: Soft. Bowel sounds are normal. He exhibits no distension. There is no tenderness  Musculoskeletal: He exhibits no edemaor tenderness.  Neurological: He is alert.  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  Able to follow simple one step motor commands at times, with visual cues--stable  Motor (Limited by aphasia, but grossly): RUE: ?3/5 LUE 4+/5 RLE: 1+/5 LLE: 4+/5 Skin: Skin is warmand dry. Ecchymosis left posterior neck, LUE, left flank and left hip, stable.    Assessment/Plan: 1. Functional deficits secondary to left intraparenchymal hemorrhage, subarachnoid hemorrhage, subdural hemorrhage  which require 3+ hours per day of interdisciplinary therapy in a comprehensive inpatient rehab setting. Physiatrist is providing close team supervision and 24 hour management of active medical problems listed below. Physiatrist and rehab team continue to assess barriers to discharge/monitor patient progress toward functional and medical goals.  Function:  Bathing Bathing position   Position: Shower  Bathing parts Body parts bathed by patient: Right arm, Left arm, Chest, Abdomen, Front perineal area, Right upper leg, Left upper leg, Right lower leg, Left lower leg Body parts bathed by helper: Buttocks, Back  Bathing assist Assist Level: Touching or steadying assistance(Pt > 75%)      Upper Body Dressing/Undressing Upper body dressing   What is the patient wearing?: Pull over shirt/dress     Pull over shirt/dress - Perfomed by patient: Thread/unthread left sleeve, Put head through opening, Pull shirt over trunk, Thread/unthread right sleeve Pull over shirt/dress - Perfomed by helper: Thread/unthread right sleeve        Upper body assist Assist Level: Supervision or verbal cues      Lower Body Dressing/Undressing Lower body dressing   What is the patient wearing?: Pants, Shoes, Socks     Pants- Performed by patient: Pull pants up/down, Thread/unthread right pants leg, Thread/unthread left pants leg Pants- Performed by helper: Thread/unthread right pants leg, Thread/unthread left pants leg   Non-skid slipper socks- Performed by helper: Don/doff right sock, Don/doff left sock Socks - Performed by patient: Don/doff right sock,  Don/doff left sock Socks - Performed by helper: Don/doff right sock, Don/doff left sock Shoes - Performed by patient: Don/doff right shoe, Don/doff left shoe Shoes - Performed by helper: Don/doff right shoe, Don/doff left shoe          Lower body assist Assist for lower body dressing: Touching or steadying assistance (Pt > 75%)      Toileting Toileting    Toileting steps completed by patient: Adjust clothing prior to toileting, Adjust clothing after toileting Toileting steps completed by helper: Performs perineal hygiene Toileting Assistive Devices: Grab bar or rail  Toileting assist Assist level: Touching or steadying assistance (Pt.75%)   Transfers Chair/bed transfer Chair/bed transfer activity did not occur: N/A Chair/bed transfer method: Stand pivot, Ambulatory Chair/bed transfer assist level: Touching or steadying assistance (Pt > 75%) Chair/bed transfer assistive device: Armrests     Locomotion Ambulation     Max distance: 100 ft Assist level: Touching or steadying assistance (Pt > 75%)   Wheelchair   Type: Manual Max wheelchair distance: 100 ft Assist Level: Moderate assistance (Pt 50 - 74%)  Cognition Comprehension Comprehension assist level: Understands basic 50 - 74% of the time/ requires cueing 25 - 49% of the time  Expression Expression assist level: Expresses basic 25 - 49% of the time/requires cueing 50 - 75% of the time. Uses single words/gestures.  Social Interaction Social Interaction assist level: Interacts appropriately 25 - 49% of time - Needs frequent redirection.  Problem Solving Problem solving assist level: Solves basic 25 - 49% of the time - needs direction more than half the time to initiate, plan or complete simple activities  Memory Memory assist level: Recognizes or recalls less than 25% of the time/requires cueing greater than 75% of the time     Medical Problem List and Plan: 1. Right hemiparesis, aphasia, cognitive dysfunction, gait disorder secondary to left intraparenchymal hemorrhage, subarachnoid hemorrhage, subdural hemorrhage following a fall 05/04/2016  Cont CIR           Repeat Head CT reviewed, spoke with Radiologist, appears to be significant, but stable bleed  2. DVT Prophylaxis/Anticoagulation: Pharmaceutical: Lovenox restarted 3. Pain Management: Hydrocodone PRN.  4. Mood: Monitor  for visual and behavioral changes. History of depression  Zoloft d/ced due to Na+ trending down, will cont to monitor 5. Neuropsych: This patient is not capable of making decisions on hisown behalf. 6. Skin/Wound Care: Routine pressure relief measures 7. Fluids/Electrolytes/Nutrition: Monitor I/O. Appetite reported to be good. 8. PAF: Monitor blood pressure and heart rate bid. Continue digoxin and Toprol XL.   Appreciate Cards recs, cont current meds, anticoagulation contraindicated due to recurrent bleeds  Cont meds 9. Seizure disorder: On keppra bid.  10. E coli UTI: Treated.  11. Urinary retention: Monitor voiding  Bethanechol 5 TID on 10/24, will d/c and monitor Na+  Toilet every 3-4 hours.  12. History of gout. Monitor for flareup 13. Review of primary care notes indicate has CODE STATUS: DO NOT RESUSCITATE, this was discussed with his wife, she will bring no code order from primary care physician.  14. HTN  Monitor with increased activity  BP controlled on 10/30 15. Hypokalemia: repleted  4.8 on 10/30 16. Hyponatremia  Na+ 126 on 10/30  Zoloft d/ced 10/27  Bethanechol d/ced 10/30  Will cont to monitor 17. Recurrent UTI:   Remains Afebrile  WBCs 10.3 on 10/30  Urine neg, CXR 10/21 reviewed, negative for infection  Repeat Ucx with E. Faecalis, macrobid 10/25-11/1  LOS (Days) 11 A FACE  TO FACE EVALUATION WAS PERFORMED  Galina Haddox Lorie Phenix 05/23/2016 10:06 AM

## 2016-05-23 NOTE — Progress Notes (Signed)
Physical Therapy Session Note  Patient Details  Name: James [name] MRN: [id number] Date of Birth: 26-Jun-1941  Today's Date: 05/23/2016 PT Individual Time: 1105-1202 PT Individual Time Calculation (min): 57 min    Short Term Goals: Week 2:  PT Short Term Goal 1 (Week 2): = LTGs due to anticipated LOS  Skilled Therapeutic Interventions/Progress Updates:   Pt presented in [place] at nursing station. Gait with SPC 57ft modA with drift to R corrected by tactile cues. Standing balance activities alternating toe taps to 4" step, standing reaching with RUE with occasional hand over hand support. Trials of target tapping with RLE with occasional PTA manually initiating movement for NMR. NuStep L5 x 10 minutes for encouragement of reciprocal pattern.  Continued gait 11ft, 27ft, with mod A for negotiation of objects particularly 2/2 R neglect. Noted decreased quality of gait towards end of session. Pt returned to geri-chair at nsg station with legs elevated and QRB in place.    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   Therapy/Group: Individual Therapy  Belma Dyches  Deshon Hsiao, PTA  05/23/2016, 12:46 PM

## 2016-05-23 NOTE — Progress Notes (Signed)
Occupational Therapy Weekly Progress Note  Patient Details  Name: James [name] MRN: [id number] Date of Birth: 06/15/1941  Beginning of progress report period: May 13, 2016 End of progress report period: May 23, 2016  Today's Date: 05/23/2016  Session 1 OT Individual Time: 1000-1100 OT Individual Time Calculation (min): 60 min   Session 2 OT Individual Time: 1500-1530 OT Individual Time Calculation (min): 60 min    Patient has met 4 of 4 short term goals. Pt is making steady progress with OT treatments at this time.  He is able to complete stand-pivot transfers with min guard assist.  R UE function continues to improve to a supervision level for bathing and dressing tasks.  He continues to be impulsive with decreased safety awareness.  MinA/Supervision for sit to to stand with instructional cueing for hand placement.     Patient continues to demonstrate the following deficits: muscle weakness, decreased coordination and decreased motor planning, decreased visual perceptual skills, decreased attention to right, decreased initiation, decreased attention, decreased awareness, decreased safety awareness, decreased memory and delayed processing and decreased standing balance and decreased postural control and therefore will continue to benefit from skilled OT intervention to enhance overall performance with BADL and Reduce care partner burden. Patient will benefit from dc to SNF for continued occupational therapy services to address functional deficits.   Patient progressing toward long term goals..  Continue plan of care.  OT Short Term Goals Week 2:  OT Short Term Goal 1 (Week 2): STG=LTG 2/2 estimated dc date   Skilled Therapeutic Interventions/Progress Updates:    Session 1 1:1 OT sessio focused on functional use of R Ue, attention to R side, NMR, in-hand manipulation, and hip/core strengthening. Utilizing tall-stacker pegs and peg board, pt completed graded fine-motor activity.  Pt required max verbal and tactile cues to utilize R UE only, decreased cues required with repetition. Functional pincer grasp, and release, translation and rotation with R hand.  Mod instructional cues for sequencing, and cues to attend to R side. Overshooting when grasping for objects. Fine motor activity completed in tall kneeling position on mat. OT facilitated weight shifting, to R side, core strengthening, and balance. Pt returned to chair at nurses station at end of session and left with needs met.   Session 2 1:1 OT session focused on on R side attention, vision, and finger isolation using dynavision. Pt required tactile cues from OT to follow commands to use R Ue. Pt with improved R shoulder/wrist hand function and able to achieve pointer finger isolation to hit buttons. OT utilized eye patch on L eye for forced use of R eye. 1/2 way through session, eye patch moved to L eye. Pt with improved attention to R side with repetition. Pt returned to room and completed stand-pivot transfer to bed with Min guard A. Pt left in bed with 4 rails up and bed alarm on.   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   Therapy/Group: Individual Therapy   S  05/23/2016, 3:45 PM    

## 2016-05-24 ENCOUNTER — Inpatient Hospital Stay (HOSPITAL_COMMUNITY): Payer: Medicare Other | Admitting: Occupational Therapy

## 2016-05-24 ENCOUNTER — Ambulatory Visit (HOSPITAL_COMMUNITY): Payer: Medicare Other | Admitting: Speech Pathology

## 2016-05-24 ENCOUNTER — Inpatient Hospital Stay (HOSPITAL_COMMUNITY): Payer: Medicare Other | Admitting: Speech Pathology

## 2016-05-24 ENCOUNTER — Inpatient Hospital Stay (HOSPITAL_COMMUNITY): Payer: Medicare Other | Admitting: Physical Therapy

## 2016-05-24 DIAGNOSIS — R339 Retention of urine, unspecified: Secondary | ICD-10-CM

## 2016-05-24 DIAGNOSIS — I609 Nontraumatic subarachnoid hemorrhage, unspecified: Secondary | ICD-10-CM

## 2016-05-24 DIAGNOSIS — I480 Paroxysmal atrial fibrillation: Secondary | ICD-10-CM

## 2016-05-24 DIAGNOSIS — N390 Urinary tract infection, site not specified: Secondary | ICD-10-CM

## 2016-05-24 DIAGNOSIS — S065X3S Traumatic subdural hemorrhage with loss of consciousness of 1 hour to 5 hours 59 minutes, sequela: Secondary | ICD-10-CM

## 2016-05-24 DIAGNOSIS — I6200 Nontraumatic subdural hemorrhage, unspecified: Secondary | ICD-10-CM

## 2016-05-24 DIAGNOSIS — R4702 Dysphasia: Secondary | ICD-10-CM

## 2016-05-24 DIAGNOSIS — R4701 Aphasia: Secondary | ICD-10-CM

## 2016-05-24 DIAGNOSIS — S098XXA Other specified injuries of head, initial encounter: Secondary | ICD-10-CM

## 2016-05-24 NOTE — Progress Notes (Signed)
Social Work Patient ID: James [name], male   DOB: August 02, 1940, 75 y.o.   MRN: [id number]  Contacted insurance to begin the prior authorization for him to go SNF to [facility] on Friday. Have faxed in clinicals. Will await return call from [facility]. Have spoken with Seth Bake from [facility] regarding transport team feels he can go by wheelchair Lucianne Lei as long as wife goes with him. Awaiting time for this. Working on transfer to [facility] on Friday.

## 2016-05-24 NOTE — Discharge Summary (Signed)
Physician Discharge Summary  Patient ID: James [name] MRN: [id number] DOB/AGE: 30-Apr-1941 75 y.o.  Admit date: 05/12/2016 Discharge date: 05/27/2016  Discharge Diagnoses:  Principal Problem:   Subdural hemorrhage following injury [facility]) Active Problems:   Benign essential HTN   Urinary retention   Global aphasia   SAH (subarachnoid hemorrhage) (HCC)   Hyponatremia   Recurrent UTI   PAF (paroxysmal atrial fibrillation) (HCC)   Sleep disturbance   Discharged Condition: Stable   Significant Diagnostic Studies: Dg Chest 2 View  Result Date: 05/14/2016 CLINICAL DATA:  75 year old male with history of leukocytosis. History of atrial fibrillation. Hypertension. EXAM: CHEST  2 VIEW COMPARISON:  Chest x-ray 04/20/2011. FINDINGS: Lung volumes are low. No consolidative airspace disease. No definite pleural effusions. Ill-defined bibasilar opacities, favored to reflect areas of subsegmental atelectasis given the low lung volumes. No evidence of pulmonary edema. Heart size is mildly enlarged. Upper mediastinal contours are within normal limits. Aortic atherosclerosis. IMPRESSION: 1. Low lung volumes with bibasilar subsegmental atelectasis. 2. Aortic atherosclerosis. 3. Mild cardiomegaly. Electronically Signed   By: Vinnie Langton M.D.   On: 05/14/2016 13:42   Ct Head Wo Contrast  Addendum Date: 05/13/2016   ADDENDUM REPORT: 05/13/2016 17:51 ADDENDUM: Prior head CTs from new [facility] dated 05/10/2016 and 05/04/2016 have become available for comparison on [employer]. Also, today's exam was further reviewed with Neuroradiologist Dr. Sharl Ma. The extensive, indistinct hyperdense hemorrhage - which is not associated with any parenchymal edema - in the left hemisphere, the anterior inferior right hemisphere, and the left superior cerebellum is in fact lobulated Subdural Hematoma. On the 05/04/2016 study there was also superimposed left hemisphere subarachnoid hemorrhage which  has regressed. Today study demonstrates no significant change from that on 05/10/2016. CONCLUSION: The previously described unusual extensive left hemisphere hemorrhage is lobulated Subdural Hematoma (is extra-axial, not intra-axial). That, along with the anterior left frontal lobe hemorrhagic contusion, the right subdural hematoma, and the parafalcine/tentorial subdural hematoma, are stable compared to outside Head CT on 05/10/2016. This information was relayed to Dr. Posey Pronto at 5:51 pm on 05/13/2016. Electronically Signed   By: Genevie Ann M.D.   On: 05/13/2016 17:51   Addendum Date: 05/13/2016   ADDENDUM REPORT: 05/13/2016 15:21 ADDENDUM: Study discussed by telephone with Dr. Delice Lesch On 05/13/2016 at 1457 hours. He advised that the patient was transferred recently from new [facility]. There are noncontrast head CTs from that facility which have been provided on compact disc. I am having those loaded into [facility] PACS, and once loaded will issue another addendum comparing to the current exam. Electronically Signed   By: Genevie Ann M.D.   On: 05/13/2016 15:21    Result Date: 05/13/2016 CLINICAL DATA:  75 year old male status post fall down stairs with intracranial hemorrhage. Initial encounter. EXAM: CT HEAD WITHOUT CONTRAST TECHNIQUE: Contiguous axial images were obtained from the base of the skull through the vertex without intravenous contrast. COMPARISON:  Brain MRI without contrast 04/12/2011. FINDINGS: Brain: Chronic posterior left hemisphere encephalomalacia with ex vacuo ventricle enlargement maximal at the occipital horn and atrium of the ventricle. Superimposed vague intra-axial appearing hemorrhage throughout the residual left occipital lobe and left temporal lobes. More typical appearing area of anterior left frontal lobe intra-axial hemorrhage with surrounding edema. This latter area of parenchymal blood encompasses 35 x 32 x 36 mm (AP by transverse by CC). Surrounding edema. However,  along the inferior most anterior left frontal lobe there is also an area of the more fade  hemorrhage as seen on series 4, image 22. Similar appearing unusual indistinct intra-axial hemorrhage in the left superior cerebellum as seen on coronal image 45. In the contralateral right hemisphere there is mixed density right subdural hematoma measuring about 6 mm in thickness throughout much of its course. However, in the inferior anterior right frontal lobe there also appears to be a small area of indistinct intra-axial hemorrhage as seen on series 2, image 13. There is no definite intraventricular hemorrhage. There is layering parafalcine and bilateral tentorial subdural hematoma which is only up to 3-4 mm in thickness. No definite subarachnoid hemorrhage. Regional mass effect on the left frontal horn. Anterior rightward midline shift up to 10 mm, although no midline shift at the level of the posterior septum pellucidum. No definite acute cortically based infarct. Skull: .Sequelae of left hemi craniectomy with reconstruction. Several vertex burr holes on the right. No acute skull fracture Sinuses/Orbits: Visualized paranasal sinuses and mastoids are stable and well pneumatized. Other: Postoperative changes to the scalp. Superimposed broad-based right vertex scalp hematoma measuring 3-4 mm in thickness. No acute orbits soft tissue findings. IMPRESSION: 1. Unusual appearance of extensive apparently intra-axial hemorrhage throughout the left temporal and occipital lobes. Similar indistinct intra-axial appearing hemorrhage in both anterior inferior frontal gyri, and to the left superior cerebellum. 2. Superimposed more typical appearing left anterior superior frontal lobe intra-axial hematoma. That lesion has an estimated blood volume of 20 mL. There is surrounding vasogenic edema, and regional mass effect including rightward midline shift up to 10 mm at the level of the frontal horn. 3. Superimposed widespread peripheral  right mixed density subdural hematoma measuring up to 6 mm in thickness. Superimposed small volume of generalized parafalcine and tentorial subdural hematoma. 4. Underlying chronic left hemisphere encephalomalacia with ex vacuo enlargement of the left lateral ventricle. 5. Sequelae of previous left craniectomy with reconstruction. No acute skull fracture identified. Electronically Signed: By: Genevie Ann M.D. On: 05/13/2016 14:43      Labs:  Basic Metabolic Panel:  Recent Labs Lab 05/20/16 0729 05/23/16 0821 05/25/16 0828 05/26/16 0837  NA 128* 126* 129* 132*  K 4.7 4.8 4.5 4.1  CL 94* 90* 91* 93*  CO2 25 26 30 31   GLUCOSE 100* 108* 125* 109*  BUN 17 9 11 14   CREATININE 0.77 0.62 0.78 0.78  CALCIUM 8.7* 9.0 9.2 9.1    CBC: CBC Latest Ref Rng & Units 05/23/2016 05/19/2016 05/16/2016  WBC 4.0 - 10.5 K/uL 10.3 15.1(H) 19.0(H)  Hemoglobin 13.0 - 17.0 g/dL 14.9 14.1 14.2  Hematocrit 39.0 - 52.0 % 42.8 41.2 42.4  Platelets 150 - 400 K/uL 276 283 323    CBG: No results for input(s): GLUCAP in the last 168 hours.  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 He was found to have 9 mm left IPH, bilateral SDH and SAH. NS recommended conservative care with serial CT. Follow up CT head with increase in bleed but neurologically reported to be improving. NO surgical intervention needed per NS. He was treated with rocephin for UTI. His diet upgraded to regular thin with small bits and cues to clear residuals due to impulsivity. He has had worsening of right sided weakness, increase in visual deficits and worsening  of aphasia. CIR recommended for follow up therapy.    Hospital Course: Neimar Ficca was admitted to rehab 05/12/2016 for inpatient  therapies to consist of PT, ST and OT at least three hours five days a week. Past admission physiatrist, therapy team and rehab RN have worked together to provide customized collaborative inpatient rehab.He was noted to have significant oral holding with spillage and exhibited signs and symptoms of aspiration. Therefore MBS was done on 10/20 and diet was downgraded to dysphagia 1, nectar liquids. He was found to enterococcus UTI and was treated with macrodantin X one week. He was found to have hyponatremia with Na-131 and serial check showed progressive drop to 126 therefore Zoloft was discontinued.  Reactive leucocytosis has resolved and antibiotics were discontinued on 10/3. Sodium levels are on upward trend and recommend recheck of lytes next week to monitor for resolution.   CT head was repeated after admission showing significant but stable bleed. He was maintained on lovenox for DVT prophylaxis. He was found to be in A fib on 10/26 and cardiology was consulted for input. He is deemed to be too high risk for anticoagulation due to recurrent bleeds. Dr. Meda Coffee recommended continuing digoxin and metoprolol as this kept rate controlled and recommended 2D echo for work up. Patient's wife has declined further work up and cardiology signed off. His blood pressures were monitored bid basis and have been controlled.  He has been seizure free on home dose Keppra.  He continues to have issues with overflow due to urinary retention and urecholine was resumed on 11/2.  He needs frequent toileting due to lack cognitive deficits and lack of awareness. Recommend checking PVR every 4-6 hours and performing in and out caths to keep bladder volumes less than 350 cc. He has made great progress but continues to have cognitive deficits with poor safety awareness requiring supervision to min assist. Wife has elected on SNF for further therapies and patient was discharged to [facility] in improved condition.    Rehab course:  During patient's stay in rehab weekly team conferences were held to monitor patient's progress, set goals and discuss barriers to discharge. At admission, he required max assist with mobility and total assist with self care tasks due to visual deficits, ideation apraxia and decreased problem solving. Verbal output was limited to perseverative jargon and he required min to mod assist to answer basic Y/N questions with visual cues.  He  has had improvement in activity tolerance, balance, postural control, as well as ability to compensate for deficits.  His diet has been upgraded to dysphagia 2 with thin liquid--he needs min cues for rate and bolus control. Verbal output is limited to spontaneous social responses with familiar words which wife reports is at baseline. He is showing improvement in ability to follow one step commands and to answer basic Y/N question.  He continues to requires min to moderate assist for redirection to task.  He is able to perform transfers with supervision and  requires min assist for ambulating 150 feet without AD. He requires supervision to navigate stairs.    Disposition: 03-Skilled Nursing Facility  Diet: Dysphagia 2, thin liquids with full supervision.   Special Instructions: 1. Repeat BMET in 3-5 days, monitor Na+ - d/c Bethanechol if trending down again. 2. Toilet patient every 3-4 hours. Perform in and out caths with coude catheter every 4-6 hours to keep bladder volumes/PVR < 350 cc.  3. Follow up with neurology in 2-3 weeks.  Medication List    STOP taking these medications   aspirin EC 81 MG tablet   metoprolol succinate 50 MG 24 hr tablet Commonly known as:  TOPROL-XL   sertraline 100 MG tablet Commonly known as:  ZOLOFT     TAKE these medications   acetaminophen 325 MG tablet Commonly known as:  TYLENOL Take 1-2 tablets (325-650 mg total) by mouth every 4 (four) hours as needed for mild pain.   bethanechol 25 MG tablet Commonly known as:   URECHOLINE Take 1 tablet (25 mg total) by mouth 4 (four) times daily.   digoxin 0.125 MG tablet Commonly known as:  LANOXIN Take 125 mcg by mouth daily.   famotidine 20 MG tablet Commonly known as:  PEPCID Take 1 tablet (20 mg total) by mouth 2 (two) times daily. What changed:  when to take this   levETIRAcetam 500 MG tablet Commonly known as:  KEPPRA Take 500 mg by mouth 2 (two) times daily.   lidocaine 2 % jelly Commonly known as:  XYLOCAINE Apply topically as needed (Use with in and out catheter).   metoprolol 50 MG tablet Commonly known as:  LOPRESSOR Take 1 tablet (50 mg total) by mouth 2 (two) times daily.   simvastatin 40 MG tablet Commonly known as:  ZOCOR Take 40 mg by mouth at bedtime.       Contact information for follow-up providers    [place], MD .   Specialty:  Internal Medicine Contact information: [address] [place] [address] [phone]            Contact information for after-discharge care    Destination    [facility] .   Specialties:  [place], [place] Contact information: [place] [phone]                  Signed: Bary Leriche 05/27/2016, 10:03 AM

## 2016-05-24 NOTE — Progress Notes (Signed)
Speech Language Pathology Daily Session Note  Patient Details  Name: James [name] MRN: [id number] Date of Birth: 08/27/1940  Today's Date: 05/24/2016 SLP Individual Time: 0900-1000 SLP Individual Time Calculation (min): 60 min   Short Term Goals: Week 2: SLP Short Term Goal 1 (Week 2): Pt will communicate wants/needs via any means with max A SLP Short Term Goal 2 (Week 2): Pt will answer basic Y/N questions with 70% acc and min A SLP Short Term Goal 3 (Week 2): Pt to demonstrate oral clearance with trials of Dys 2 with mod A SLP Short Term Goal 4 (Week 2): Pt to clear oral cavity of thin liquid boluses within 2 seconds with min verbal cueing SLP Short Term Goal 5 (Week 2): Pt will sustain his attention to basic, familiar tasks for 5 minutes with min verbal cues for redirection.   Skilled Therapeutic Interventions:Pt seen for skilled SLP treatment with focus on swallowing and language. Trialed with dry cracker- required pureed bolus to clear from oral cavity. Trialed 6 oz of water via teaspoon and cup sip without oral holding or overt s/s aspiration. Pt unable to correctly identify an object in a field of 2, follow simple directives or answer Y/N ?s despite mod A of visual and verbal cueing, however he was able to write his name and his spouse's names recognizably at mod I level.    Function:  Eating Eating   Modified Consistency Diet: Yes Eating Assist Level: Supervision or verbal cues;Help managing cup/glass;Helper checks for pocketed food;Set up assist for   Eating Set Up Assist For: Opening containers Helper Scoops Food on Utensil: Occasionally     Cognition Comprehension Comprehension assist level: Understands basic 50 - 74% of the time/ requires cueing 25 - 49% of the time  Expression   Expression assist level: Expresses basic 25 - 49% of the time/requires cueing 50 - 75% of the time. Uses single words/gestures.  Social Interaction Social Interaction assist level: Interacts  appropriately 75 - 89% of the time - Needs redirection for appropriate language or to initiate interaction.  Problem Solving Problem solving assist level: Solves basic 25 - 49% of the time - needs direction more than half the time to initiate, plan or complete simple activities  Memory Memory assist level: Recognizes or recalls less than 25% of the time/requires cueing greater than 75% of the time    Pain Pain Assessment Pain Assessment: Faces Faces Pain Scale: No hurt  Therapy/Group: Individual Therapy  Vinetta Bergamo MA, CCC-SLP 05/24/2016, 9:58 AM

## 2016-05-24 NOTE — Progress Notes (Signed)
Physical Therapy Session Note  Patient Details  Name: James [name] MRN: [id number] Date of Birth: 1940-10-17  Today's Date: 05/24/2016 PT Individual Time: 0805-0900 PT Individual Time Calculation (min): 55 min    Short Term Goals: Week 2:  PT Short Term Goal 1 (Week 2): = LTGs due to anticipated LOS  Skilled Therapeutic Interventions/Progress Updates:    Patient awake in bed upon arrival. Sat EOB with supervision and performed UB dressing and LB dressing with verbal cues for hemi dressing technique with setup/supervision. Donned socks with total A and shoes with setup/supervision. Gait training using SPC 2 x 150 ft with mod A overall and max cues for path finding and R attention to avoid obstacles. Engaged in functional task to address R attention to environment and body, visual scanning, and functional grasp/release RUE while pushing grocery cart to locate and retrieve targets on R and L side of hallway with max faded to mod cues and min-mod A overall for balance. Patient ambulated without device throughout rehab gym to put away objects with max cues to utilize and attend to RUE and release object with RUE with min A overall. Patient ambulated back to room using [facility] and left in [place] with quick release belt donned for next SLP session immediately following.   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   Therapy/Group: Individual Therapy  Laretta Alstrom 05/24/2016, 11:52 AM

## 2016-05-24 NOTE — Progress Notes (Signed)
Speech Language Pathology Daily Session Note  Patient Details  Name: James [name] MRN: [id number] Date of Birth: [birth year]  Today's Date: 05/24/2016 SLP Individual Time: 1205-1230 SLP Individual Time Calculation (min): 25 min   Short Term Goals: Week 2: SLP Short Term Goal 1 (Week 2): Pt will communicate wants/needs via any means with max A SLP Short Term Goal 2 (Week 2): Pt will answer basic Y/N questions with 70% acc and min A SLP Short Term Goal 3 (Week 2): Pt to demonstrate oral clearance with trials of Dys 2 with mod A SLP Short Term Goal 4 (Week 2): Pt to clear oral cavity of thin liquid boluses within 2 seconds with min verbal cueing SLP Short Term Goal 5 (Week 2): Pt will sustain his attention to basic, familiar tasks for 5 minutes with min verbal cues for redirection.   Skilled Therapeutic Interventions:  Pt was seen for skilled ST targeting goals for dysphagia.  SLP facilitated the session with a trial meal tray of dys 1 textures with thin liquids to continue working towards diet progression.  Pt is demonstrating improved oral clearance of purees but needed up to mod assist multimodal cues for rate and portion control.  Pt consumed thin liquids with the same amount of assistance needed as purees due to impulsivity.  Despite large boluses pt did not demonstrate overt s/s of aspiration but was slightly congested sounding following meal which SLP suspects could be related to pharyngeal residue due to what appears to be a delay in swallow initiation resulting from decreased attention to boluses.  Difficult to definitively determine at bedside; however, given that pt's overall swallowing function appears improved, recommend proceeding with repeat objective assessment at next available appointment.  Pt was left in bed in care of nursing.  Session ended early as pt needed in and out cath due to urinary retention.  Continue per current plan of care.    Function:  Eating Eating   Modified  Consistency Diet: Yes Eating Assist Level: Supervision or verbal cues;Help managing cup/glass;Set up assist for   Eating Set Up Assist For: Opening containers Helper Scoops Food on Utensil: Occasionally     Cognition Comprehension Comprehension assist level: Understands basic 50 - 74% of the time/ requires cueing 25 - 49% of the time  Expression   Expression assist level: Expresses basic 25 - 49% of the time/requires cueing 50 - 75% of the time. Uses single words/gestures.  Social Interaction Social Interaction assist level: Interacts appropriately 25 - 49% of time - Needs frequent redirection.  Problem Solving Problem solving assist level: Solves basic 25 - 49% of the time - needs direction more than half the time to initiate, plan or complete simple activities  Memory Memory assist level: Recognizes or recalls less than 25% of the time/requires cueing greater than 75% of the time    Pain Pain Assessment Pain Assessment: No/denies pain Faces Pain Scale: No hurt  Therapy/Group: Individual Therapy  James [name], James [name] 05/24/2016, 3:47 PM

## 2016-05-24 NOTE — Progress Notes (Addendum)
[facility] PHYSICAL MEDICINE & REHABILITATION     PROGRESS NOTE  Subjective/Complaints:  Pt seen laying in bed this AM, eating breakfast.  He continues to follow commands inconsistently.    ROS: Unable to assess due to language  Objective: Vital Signs: Blood pressure 127/74, pulse 100, temperature 98.1 F (36.7 C), temperature source Oral, resp. rate 18, height 5\' 11"  (1.803 m), weight 97.3 kg (214 lb 9.6 oz), SpO2 99 %. No results found.  Recent Labs  05/23/16 0821  WBC 10.3  HGB 14.9  HCT 42.8  PLT 276    Recent Labs  05/23/16 0821  NA 126*  K 4.8  CL 90*  GLUCOSE 108*  BUN 9  CREATININE 0.62  CALCIUM 9.0   CBG (last 3)  No results for input(s): GLUCAP in the last 72 hours.  Wt Readings from Last 3 Encounters:  05/18/16 97.3 kg (214 lb 9.6 oz)  05/11/16 90.7 kg (200 lb)    Physical Exam:  BP 127/74 (BP Location: Right Arm)   Pulse 100   Temp 98.1 F (36.7 C) (Oral)   Resp 18   Ht 5\' 11"  (1.803 m)   Wt 97.3 kg (214 lb 9.6 oz)   SpO2 99%   BMI 29.93 kg/m  Constitutional: He appears well-developedand well-nourished. NAD.  Head: Old healed incision on scalp Eyes: Conjunctivaeand EOMare normal.  Cardiovascular: Irregularly, irregular. No JVD. Respiratory: Breath sounds normal. No stridor. No respiratory distress. He has no wheezes.  GI: Soft. Bowel sounds are normal. He exhibits no distension. There is no tenderness  Musculoskeletal: He exhibits no edemaor tenderness.  Neurological: He is alert.  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 Able to follow simple one step motor commands at times, with visual cues Motor (Limited by aphasia, but grossly): RUE: ?3/5 LUE 4+/5 RLE: 1+/5 LLE: 4+/5 Unable to assess tone due to pt movement Skin: Skin is warmand dry. Ecchymosis left posterior neck, LUE, left flank and left hip, stable.    Assessment/Plan: 1. Functional deficits secondary to left intraparenchymal hemorrhage,  subarachnoid hemorrhage, subdural hemorrhage which require 3+ hours per day of interdisciplinary therapy in a comprehensive inpatient rehab setting. Physiatrist is providing close team supervision and 24 hour management of active medical problems listed below. Physiatrist and rehab team continue to assess barriers to discharge/monitor patient progress toward functional and medical goals.  Function:  Bathing Bathing position   Position: Shower  Bathing parts Body parts bathed by patient: Right arm, Left arm, Chest, Abdomen, Front perineal area, Right upper leg, Left upper leg, Right lower leg, Left lower leg Body parts bathed by helper: Buttocks, Back  Bathing assist Assist Level: Touching or steadying assistance(Pt > 75%)      Upper Body Dressing/Undressing Upper body dressing   What is the patient wearing?: Pull over shirt/dress     Pull over shirt/dress - Perfomed by patient: Thread/unthread left sleeve, Put head through opening, Pull shirt over trunk, Thread/unthread right sleeve Pull over shirt/dress - Perfomed by helper: Thread/unthread right sleeve        Upper body assist Assist Level: Supervision or verbal cues      Lower Body Dressing/Undressing Lower body dressing   What is the patient wearing?: Pants, Shoes, Socks     Pants- Performed by patient: Pull pants up/down, Thread/unthread right pants leg, Thread/unthread left pants leg Pants- Performed by helper: Thread/unthread right pants leg, Thread/unthread left pants leg   Non-skid slipper socks- Performed by helper: Don/doff right sock, Don/doff left sock  Socks - Performed by patient: Don/doff right sock, Don/doff left sock Socks - Performed by helper: Don/doff right sock, Don/doff left sock Shoes - Performed by patient: Don/doff right shoe, Don/doff left shoe Shoes - Performed by helper: Don/doff right shoe, Don/doff left shoe          Lower body assist Assist for lower body dressing: Touching or steadying  assistance (Pt > 75%)      Toileting Toileting   Toileting steps completed by patient: Adjust clothing prior to toileting, Adjust clothing after toileting Toileting steps completed by helper: Performs perineal hygiene Toileting Assistive Devices: Grab bar or rail  Toileting assist Assist level: Touching or steadying assistance (Pt.75%)   Transfers Chair/bed transfer Chair/bed transfer activity did not occur: N/A Chair/bed transfer method: Stand pivot, Ambulatory Chair/bed transfer assist level: Touching or steadying assistance (Pt > 75%) Chair/bed transfer assistive device: Armrests     Locomotion Ambulation     Max distance: 100 ft Assist level: Touching or steadying assistance (Pt > 75%)   Wheelchair   Type: Manual Max wheelchair distance: 100 ft Assist Level: Moderate assistance (Pt 50 - 74%)  Cognition Comprehension Comprehension assist level: Understands basic 50 - 74% of the time/ requires cueing 25 - 49% of the time  Expression Expression assist level: Expresses basic 25 - 49% of the time/requires cueing 50 - 75% of the time. Uses single words/gestures.  Social Interaction Social Interaction assist level: Interacts appropriately 25 - 49% of time - Needs frequent redirection.  Problem Solving Problem solving assist level: Solves basic 25 - 49% of the time - needs direction more than half the time to initiate, plan or complete simple activities  Memory Memory assist level: Recognizes or recalls less than 25% of the time/requires cueing greater than 75% of the time     Medical Problem List and Plan: 1. Right hemiparesis, aphasia, cognitive dysfunction, gait disorder secondary to left intraparenchymal hemorrhage, subarachnoid hemorrhage, subdural hemorrhage following a fall 05/04/2016  Cont CIR           Repeat Head CT reviewed, spoke with Radiologist, appears to be significant, but stable bleed  2. DVT Prophylaxis/Anticoagulation: Pharmaceutical: Lovenox restarted 3.  Pain Management: Hydrocodone PRN.  4. Mood: Monitor for visual and behavioral changes. History of depression  Zoloft d/ced due to Na+ trending down, will cont to monitor 5. Neuropsych: This patient is not capable of making decisions on hisown behalf. 6. Skin/Wound Care: Routine pressure relief measures 7. Fluids/Electrolytes/Nutrition: Monitor I/O. Appetite reported to be good. 8. PAF: Monitor blood pressure and heart rate bid. Continue digoxin and Toprol XL.   Appreciate Cards recs, cont current meds, anticoagulation contraindicated due to recurrent bleeds  Cont meds 9. Seizure disorder: On keppra bid.  10. E coli UTI: Treated.  11. Urinary retention: Monitor voiding  Bethanechol 5 TID on 10/24, d/ced and monitor Na+  Toilet every 3-4 hours.  12. History of gout. Monitor for flareup 13. Review of primary care notes indicate has CODE STATUS: DO NOT RESUSCITATE, this was discussed with his wife, she will bring no code order from primary care physician.  14. HTN  Monitor with increased activity  BP controlled on 10/30 15. Hypokalemia: repleted  4.8 on 10/31 16. Hyponatremia  Na+ 126 on 10/30  Zoloft d/ced 10/27  Bethanechol d/ced 10/30  Will cont to monitor 17. Recurrent UTI:   Remains Afebrile  WBCs 10.3 on 10/30  Urine neg, CXR 10/21 reviewed, negative for infection  Repeat Ucx with E. Faecalis, macrobid  10/25-11/1  LOS (Days) 12 A FACE TO FACE EVALUATION WAS PERFORMED  Ankit Lorie Phenix 05/24/2016 9:24 AM

## 2016-05-24 NOTE — Progress Notes (Signed)
Occupational Therapy Session Note  Patient Details  Name: James [name] MRN: [id number] Date of Birth: [birth year]  Today's Date: 05/24/2016  Session 1 OT Individual Time: 1005-1100 OT Individual Time Calculation (min): 55 min   Session 2 OT Individual Time: 1415-1445 OT Individual Time Calculation (min): 30 min    Short Term Goals: Week 2:  OT Short Term Goal 1 (Week 2): STG=LTG 2/2 estimated dc date  Skilled Therapeutic Interventions/Progress Updates:    Session 1 1:1 OT session focused on functional grasp,  problem solving, attention to R, bilateral coordination. Pt eager to get out of chair upon OT arrival. When completing stand-pivot transfer to w/c on R, pt impulsively attempted to transfer L to the bed requiring OT to assist pt safely back into chair. OT explained purpose for transferring into w/c and gave visual cue for transfer, and pt completed stand-pivot with Min A. Pt brought to therapy gym and worked on bilateral coordination with ball toss, lateral pinch, pincer grasp, patterns, and problem solving. Pt unable to achieve pincer grasp on clips, but was successful with lateral pinch.  Pt able to follow 3 color pattern with forward chaining and Mod nstructional cues initially, progressing to min cues with repetition. Pt returned to room and completed stand-pivot transfer back to bed with min A. 4 rails up and bed alarm on per safety plan.   Session 2 1:1 OT session focused on visual motor skills, following 2 step commands, and fine-motor skills w/ R hand. Stand-pivot transfers with CGA. Able to follow 2 step commands with max verbal cues. Utilized patch to L eye to force use of R eye. Pt tolerated patch well during fine-motor activity. Pt returned to room at end of session and left with spouse present and needs met.   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   Therapy/Group: Individual Therapy  Valma Cava 05/24/2016, 4:02 PM

## 2016-05-25 ENCOUNTER — Inpatient Hospital Stay (HOSPITAL_COMMUNITY): Payer: Medicare Other | Admitting: Occupational Therapy

## 2016-05-25 ENCOUNTER — Inpatient Hospital Stay (HOSPITAL_COMMUNITY): Payer: Medicare Other | Admitting: Physical Therapy

## 2016-05-25 ENCOUNTER — Inpatient Hospital Stay (HOSPITAL_COMMUNITY): Payer: Medicare Other

## 2016-05-25 ENCOUNTER — Inpatient Hospital Stay (HOSPITAL_COMMUNITY): Payer: Medicare Other | Admitting: Speech Pathology

## 2016-05-25 LAB — BASIC METABOLIC PANEL
Anion gap: 8 (ref 5–15)
BUN: 11 mg/dL (ref 6–20)
CHLORIDE: 91 mmol/L — AB (ref 101–111)
CO2: 30 mmol/L (ref 22–32)
CREATININE: 0.78 mg/dL (ref 0.61–1.24)
Calcium: 9.2 mg/dL (ref 8.9–10.3)
GFR calc non Af Amer: >60 mL/min (ref >60)
Glucose, Bld: 125 mg/dL — ABNORMAL HIGH (ref 65–99)
POTASSIUM: 4.5 mmol/L (ref 3.5–5.1)
Sodium: 129 mmol/L — ABNORMAL LOW (ref 135–145)

## 2016-05-25 NOTE — Progress Notes (Signed)
Speech Language Pathology Note  Patient Details  Name: James [name] MRN: [id number] Date of Birth: [birth year] Today's Date: 05/25/2016  MBSS complete. Full report located under chart review in imaging section.    Nayvie Lips, Selinda Orion 05/25/2016, 12:54 PM

## 2016-05-25 NOTE — Progress Notes (Signed)
[facility] PHYSICAL MEDICINE & REHABILITATION     PROGRESS NOTE  Subjective/Complaints:  Pt seen laying in bed.  Sleep chart reviewed, pt up at 2AM, otherwise, sleeping.    ROS: Unable to assess due to language  Objective: Vital Signs: Blood pressure 125/62, pulse 63, temperature 98.2 F (36.8 C), temperature source Oral, resp. rate 18, height 5\' 11"  (1.803 m), weight 97.3 kg (214 lb 9.6 oz), SpO2 96 %. No results found.  Recent Labs  05/23/16 0821  WBC 10.3  HGB 14.9  HCT 42.8  PLT 276    Recent Labs  05/23/16 0821  NA 126*  K 4.8  CL 90*  GLUCOSE 108*  BUN 9  CREATININE 0.62  CALCIUM 9.0   CBG (last 3)  No results for input(s): GLUCAP in the last 72 hours.  Wt Readings from Last 3 Encounters:  05/18/16 97.3 kg (214 lb 9.6 oz)  05/11/16 90.7 kg (200 lb)    Physical Exam:  BP 125/62 (BP Location: Right Arm)   Pulse 63   Temp 98.2 F (36.8 C) (Oral)   Resp 18   Ht 5\' 11"  (1.803 m)   Wt 97.3 kg (214 lb 9.6 oz)   SpO2 96%   BMI 29.93 kg/m  Constitutional: He appears well-developedand well-nourished. NAD.  Head: Old healed incision on scalp Eyes: Conjunctivaeand EOMare normal.  Cardiovascular: Irregularly, irregular. No JVD. Respiratory: Breath sounds normal. No stridor. No respiratory distress. He has no wheezes.  GI: Soft. Bowel sounds are normal. He exhibits no distension. There is no tenderness  Musculoskeletal: He exhibits no edemaor tenderness.  Neurological: He is alert.  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 Able to follow simple one step motor commands inconsistently  Motor (Limited by aphasia, but grossly): RUE: ?3/5 (?improving - increased spontaneous usage) LUE 4+/5 RLE: 1+/5 LLE: 4+/5 Unable to assess tone due to pt movement Skin: Skin is warmand dry. Ecchymosis left posterior neck, LUE, left flank and left hip, stable.    Assessment/Plan: 1. Functional deficits secondary to left intraparenchymal  hemorrhage, subarachnoid hemorrhage, subdural hemorrhage which require 3+ hours per day of interdisciplinary therapy in a comprehensive inpatient rehab setting. Physiatrist is providing close team supervision and 24 hour management of active medical problems listed below. Physiatrist and rehab team continue to assess barriers to discharge/monitor patient progress toward functional and medical goals.  Function:  Bathing Bathing position   Position: Shower  Bathing parts Body parts bathed by patient: Right arm, Left arm, Chest, Abdomen, Front perineal area, Right upper leg, Left upper leg, Right lower leg, Left lower leg Body parts bathed by helper: Buttocks, Back  Bathing assist Assist Level: Touching or steadying assistance(Pt > 75%)      Upper Body Dressing/Undressing Upper body dressing   What is the patient wearing?: Pull over shirt/dress     Pull over shirt/dress - Perfomed by patient: Thread/unthread left sleeve, Put head through opening, Pull shirt over trunk, Thread/unthread right sleeve Pull over shirt/dress - Perfomed by helper: Thread/unthread right sleeve        Upper body assist Assist Level: Supervision or verbal cues      Lower Body Dressing/Undressing Lower body dressing   What is the patient wearing?: Pants, Shoes, Socks     Pants- Performed by patient: Pull pants up/down, Thread/unthread right pants leg, Thread/unthread left pants leg Pants- Performed by helper: Thread/unthread right pants leg, Thread/unthread left pants leg   Non-skid slipper socks- Performed by helper: Don/doff right sock, Don/doff left  sock Socks - Performed by patient: Don/doff right sock, Don/doff left sock Socks - Performed by helper: Don/doff right sock, Don/doff left sock Shoes - Performed by patient: Don/doff right shoe, Don/doff left shoe Shoes - Performed by helper: Don/doff right shoe, Don/doff left shoe          Lower body assist Assist for lower body dressing: Supervision or  verbal cues      Toileting Toileting   Toileting steps completed by patient: Adjust clothing prior to toileting, Adjust clothing after toileting Toileting steps completed by helper: Performs perineal hygiene Toileting Assistive Devices: Grab bar or rail  Toileting assist Assist level: Touching or steadying assistance (Pt.75%)   Transfers Chair/bed transfer Chair/bed transfer activity did not occur: N/A Chair/bed transfer method: Stand pivot, Ambulatory Chair/bed transfer assist level: Touching or steadying assistance (Pt > 75%) Chair/bed transfer assistive device: Armrests, Cane     Locomotion Ambulation     Max distance: 150 ft Assist level: Moderate assist (Pt 50 - 74%)   Wheelchair   Type: Manual Max wheelchair distance: 100 ft Assist Level: Moderate assistance (Pt 50 - 74%)  Cognition Comprehension Comprehension assist level: Understands basic 50 - 74% of the time/ requires cueing 25 - 49% of the time  Expression Expression assist level: Expresses basic 25 - 49% of the time/requires cueing 50 - 75% of the time. Uses single words/gestures.  Social Interaction Social Interaction assist level: Interacts appropriately 25 - 49% of time - Needs frequent redirection.  Problem Solving Problem solving assist level: Solves basic 25 - 49% of the time - needs direction more than half the time to initiate, plan or complete simple activities  Memory Memory assist level: Recognizes or recalls less than 25% of the time/requires cueing greater than 75% of the time     Medical Problem List and Plan: 1. Right hemiparesis, aphasia, cognitive dysfunction, gait disorder secondary to left intraparenchymal hemorrhage, subarachnoid hemorrhage, subdural hemorrhage following a fall 05/04/2016  Cont CIR           Repeat Head CT reviewed, spoke with Radiologist, appears to be significant, but stable bleed  2. DVT Prophylaxis/Anticoagulation: Pharmaceutical: Lovenox restarted 3. Pain Management:  Hydrocodone PRN.  4. Mood: Monitor for visual and behavioral changes. History of depression  Zoloft d/ced due to Na+ trending down, will cont to monitor 5. Neuropsych: This patient is not capable of making decisions on hisown behalf. 6. Skin/Wound Care: Routine pressure relief measures 7. Fluids/Electrolytes/Nutrition: Monitor I/O. Appetite reported to be good. 8. PAF: Monitor blood pressure and heart rate bid. Continue digoxin and Toprol XL.   Appreciate Cards recs, cont current meds, anticoagulation contraindicated due to recurrent bleeds  Cont meds 9. Seizure disorder: On keppra bid.  10. E coli UTI: Treated.  11. Urinary retention: Monitor voiding  Bethanechol 5 TID on 10/24, d/ced and monitor Na+  Toilet every 3-4 hours.  12. History of gout. Monitor for flareup 13. Review of primary care notes indicate has CODE STATUS: DO NOT RESUSCITATE, this was discussed with his wife, she will bring no code order from primary care physician.  14. HTN  Monitor with increased activity  BP controlled on 11/1 15. Hypokalemia: repleted  4.8 on 10/31  Labs ordered for tomorrow 16. Hyponatremia  Na+ 126 on 10/30  Zoloft d/ced 10/27  Bethanechol d/ced 10/30  Will cont to monitor  Labs ordered for tomorrow 17. Recurrent UTI:   Remains Afebrile  WBCs 10.3 on 10/30  Urine neg, CXR 10/21 reviewed, negative for  infection  Repeat Ucx with E. Faecalis, macrobid 10/25-11/1  LOS (Days) 13 A FACE TO FACE EVALUATION WAS PERFORMED  Ankit Lorie Phenix 05/25/2016 8:33 AM

## 2016-05-25 NOTE — Patient Care Conference (Signed)
Inpatient RehabilitationTeam Conference and Plan of Care Update Date: 05/25/2016   Time: 2;15 PM    Patient Name: James [name]      Medical Record Number: [id number]  Date of Birth: 08/23/1940 Sex: Male         Room/Bed: 4W17C/4W17C-01 Payor Info: Payor: [place] / Plan: BCBS MEDICARE / Product Type: *No Product type* /    Admitting Diagnosis: TBI  Admit Date/Time:  05/12/2016 11:31 AM Admission Comments: No comment available   Primary Diagnosis:  Subdural hemorrhage following injury ([place]) Principal Problem: Subdural hemorrhage following injury [facility])  Patient Active Problem List   Diagnosis Date Noted  . PAF (paroxysmal atrial fibrillation) ([place])   . Recurrent UTI   . Leukocytosis   . Hypokalemia   . Hyponatremia   . Abnormal urine   . 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 Benign essential HTN   . Urinary retention   . History of gout   . Global aphasia   . SAH (subarachnoid hemorrhage) ([employer])   . SDH (subdural hematoma) ([place])   . Lymphocytosis   . Subdural hemorrhage following injury ([place]) 05/12/2016    Expected Discharge Date: Expected Discharge Date: 05/27/16  Team Members Present: Physician leading conference: Dr. Delice Lesch Social Worker Present: Ovidio Kin, LCSW Nurse Present: Heather Roberts, RN PT Present: Carney Living, PT OT Present: Other (comment) [place]land [place] Doe-OT) SLP Present: [name], SLP PPS Coordinator present : Daiva Nakayama, RN, CRRN     Current Status/Progress Goal Weekly Team Focus  Medical   Right hemiparesis, aphasia, cognitive dysfunction, gait disorder secondary to left intraparenchymal hemorrhage, subarachnoid hemorrhage, subdural hemorrhage following a fall 05/04/2016  Improve safety, mobility, follow labs, bladder, UTI,   See above   Bowel/Bladder   Continent to bowel.LBM 10/30. In and Out cath Q 6-8 hrs.(coude)  To be continent to bowel with min. assisst.  Monitor bowel and bladder function Q  shift.   Swallow/Nutrition/ Hydration   Dys 2 thin liquids; full supervision for rate and portion control   min assist   toleration of upgrade    ADL's   Overall Min/Mod A, mod verbal cues  Overall Min A  R NMr, R side awareness, cognitive training, bathing/dressing, functional grasp   Mobility   min-mod A  supervision-min A  functional mobility, R NMR, R attention, safety awareness, standing balance, cog remediation, activity tolerance, pt/fam education   Communication   max assist   mod assist   functional, multimodal communication    Safety/Cognition/ Behavioral Observations  mod assist for basic   supervision for sustained attention   basic tasks    Pain   Non complain of pain  To keep pain levels less than 3,on 1 to 10 scale.  Assess for pain Q 2-3 hrs,and PRN   Skin   OLd bruises on left upper and lower areas.MASD on groin.MGP applied.  To keep skin free of breakdown or infections.  Assess skin Q shift and PRN      *See Care Plan and progress notes for long and short-term goals.  Barriers to Discharge: Safety, cognition, mobility, urinary retention, UTI, hyponatremia, hypokalemia, dysphagia    Possible Resolutions to Barriers:  Abx, therapies, follow labs - meds d/ced due to Na+, will restart as appropriate, advanced diet    Discharge Planning/Teaching Needs:  Plan to go to [facility] on Friday. Wife to ride with in wheelchair van.      Team Discussion:  Making  progress in therapies-min-mod level of assistance. MBS diet upgraded to Dys 2 with thin liquids. Continues to need I & O cath can not void on his own. Needs constant cueing. Started to use a cane with PT today. Team recommends SNF for more rehab.  Revisions to Treatment Plan:  DC 11/3-to [facility] [place] [facility]   Continued Need for Acute Rehabilitation Level of Care: The patient requires daily medical management by a physician with specialized training in physical medicine and rehabilitation for the following  conditions: Daily direction of a multidisciplinary physical rehabilitation program to ensure safe treatment while eliciting the highest outcome that is of practical value to the patient.: Yes Daily medical management of patient stability for increased activity during participation in an intensive rehabilitation regime.: Yes Daily analysis of laboratory values and/or radiology reports with any subsequent need for medication adjustment of medical intervention for : Neurological problems;Urological problems;Other  Elease Hashimoto 05/26/2016, 8:32 AM

## 2016-05-25 NOTE — Progress Notes (Signed)
Speech Language Pathology Daily Session Note  Patient Details  Name: James [name] MRN: [id number] Date of Birth: 12/21/1940  Today's Date: 05/25/2016 SLP Individual Time: 1300-1330 SLP Individual Time Calculation (min): 30 min   Short Term Goals: Week 2: SLP Short Term Goal 1 (Week 2): Pt will communicate wants/needs via any means with max A SLP Short Term Goal 2 (Week 2): Pt will answer basic Y/N questions with 70% acc and min A SLP Short Term Goal 3 (Week 2): Pt to demonstrate oral clearance with trials of Dys 2 with mod A SLP Short Term Goal 4 (Week 2): Pt to clear oral cavity of thin liquid boluses within 2 seconds with min verbal cueing SLP Short Term Goal 5 (Week 2): Pt will sustain his attention to basic, familiar tasks for 5 minutes with min verbal cues for redirection.   Skilled Therapeutic Interventions: Pt was seen for skilled ST targeting dysphagia goals.  Pt consumed dys 2 textures and thin liquids with max assist verbal and tactile cues for use of swallowing precautions due to impulsivity and poor awareness of boluses with delay in swallow initiation following oral clearance.  No overt s/s of aspiration evident with solids or liquids.  Discussed with pt's wife results of MBS and rationale behind recommended diet and swallowing precautions.  All questions were answered to her satisfaction at this time.  Pt handed off to primary PT.  Continue per current plan of care.    Function:  Eating Eating   Modified Consistency Diet: Yes Eating Assist Level: Supervision or verbal cues;Help managing cup/glass;Set up assist for   Eating Set Up Assist For: Opening containers       Cognition Comprehension Comprehension assist level: Understands basic 50 - 74% of the time/ requires cueing 25 - 49% of the time  Expression   Expression assist level: Expresses basic 25 - 49% of the time/requires cueing 50 - 75% of the time. Uses single words/gestures.  Social Interaction Social Interaction  assist level: Interacts appropriately 25 - 49% of time - Needs frequent redirection.  Problem Solving Problem solving assist level: Solves basic 25 - 49% of the time - needs direction more than half the time to initiate, plan or complete simple activities  Memory Memory assist level: Recognizes or recalls less than 25% of the time/requires cueing greater than 75% of the time    Pain Pain Assessment Pain Assessment: No/denies pain Faces Pain Scale: No hurt  Therapy/Group: Individual Therapy  Estelene Carmack, Selinda Orion 05/25/2016, 4:31 PM

## 2016-05-25 NOTE — Plan of Care (Signed)
Problem: RH Wheelchair Mobility Goal: LTG Patient will propel w/c in controlled environment (PT) LTG: Patient will propel wheelchair in controlled environment, # of feet with assist (PT)  Outcome: Not Applicable Date Met: 70/01/74 Goal DC'd as patient is ambulatory.  Goal: LTG Patient will propel w/c in home environment (PT) LTG: Patient will propel wheelchair in home environment, # of feet with assistance (PT).  Outcome: Not Applicable Date Met: 94/49/67 Goal DC'd as patient is ambulatory.

## 2016-05-25 NOTE — Progress Notes (Signed)
Occupational Therapy Session Note  Patient Details  Name: James [name] MRN: [id number] Date of Birth: 1941-01-27  Today's Date: 05/25/2016 OT Individual Time: 0800-0830 OT Individual Time Calculation (min): 30 min       Skilled Therapeutic Interventions/Progress Updates:   1:1 Engaged in self feeding at EOB. Focus on use of right hand as dominant to self feed. Pt required min cues and extra time to locate objects on the right side of tray. Pt able to don short with close supervision sit to stand as well as don shoes with supervision. PT ambulated to bathroom with SPC with min  A and was able to perform toileting with mod A (hygiene after small BM).  Pt ambulated to RN station with min A with directional cues to get to the RN station. Left with NT in w/c in prep for MBS.  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   Therapy/Group: Individual Therapy  [name] [facility] 05/25/2016, 3:33 PM

## 2016-05-25 NOTE — Plan of Care (Signed)
Problem: RH Toileting Goal: LTG Patient will perform toileting w/assist, cues/equip (OT) LTG: Patient will perform toiletiing (clothes management/hygiene) with assist, with/without cues using equipment (OT)  Outcome: Not Applicable Date Met: 03/52/48 D/c goal 2/2 in/out cath - ESD

## 2016-05-25 NOTE — Plan of Care (Signed)
Problem: RH Toilet Transfers Goal: LTG Patient will perform toilet transfers w/assist (OT) LTG: Patient will perform toilet transfers with assist, with/without cues using equipment (OT)  Outcome: Not Applicable Date Met: 16/38/45 No continent B&B episodes, in/out cath- ESD

## 2016-05-25 NOTE — Progress Notes (Signed)
Occupational Therapy Progress Note and Discharge Summary  Patient Details  Name: Carthel Castille MRN: [id number] Date of Birth: 1940-10-27  Today's Date: 05/26/2016 OT Individual Time: 1430-1500 OT Individual Time Calculation (min): 30 min   GRAD DAY! Self care retraining at shower level. Focus on functional ambulation around room with steadying A and mod VC min to mod cues for attention right. Pt able to perform basic transfers at supervision level with extra time. Pt bathed with supervision with minimal question cues to wash all parts. Pt dressed sit to stand with supervision after being able to pick out clothing with only one prompt. Pt assisted with shaving today (didnot shave himself prior to admit). Pt with improvements in balance and coordination and use of right hand at non dominant level. Left with nursing to cath him at end of session.    Discharge summary Patient has met 12 of 12 long term goals due to improved activity tolerance, improved balance, postural control, ability to compensate for deficits, functional use of  RIGHT upper and RIGHT lower extremity, improved attention, improved awareness and improved coordination.  Patient to discharge at overall supervision to [place] level.  Patient is able to complete functional transfers and modified bathing/dressing tasks with overall supervision to Min A. Pt has improved functional use of R Ue,  improved dynamic standing balance and activity tolerance needed to perform daily self-care tasks. Pt continues to be very impulsive with diminished safety awareness. Patient to discharge to skilled nursing facility for continued rehabilitation in next venue of care.    Reasons goals not met: n/a  Recommendation:  Patient will benefit from ongoing skilled OT services in skilled nursing facility setting to continue to advance functional skills in the area of BADL and Reduce care partner burden.  Equipment: No equipment provided  Reasons for  discharge: treatment goals met and discharge from hospital  Patient/family agrees with progress made and goals achieved: Yes  OT Discharge Precautions/Restrictions    General   Vital Signs Therapy Vitals Temp: 97.5 F (36.4 C) Temp Source: Axillary Pulse Rate: 67 Resp: 18 BP: 93/63 Patient Position (if appropriate): Lying Oxygen Therapy SpO2: 99 % O2 Device: Not Delivered Pain  no c/o pain  ADL ADL ADL Comments: See functional navigator Vision/Perception  Vision- History Baseline Vision/History: Wears glasses Wears Glasses: At all times Patient Visual Report: Peripheral vision impairment;Nausea/blurring vision with head movement Vision- Assessment Vision Assessment?: Vision impaired- to be further tested in functional context;Yes Eye Alignment: Impaired (comment) Alignment/Gaze Preference: Gaze left Saccades: Additional eye shifts occurred during testing Convergence: Impaired (comment) Visual Fields: Right visual field deficit Additional Comments: present but difficult to fully 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)  Cognition Overall Cognitive Status: History of cognitive impairments - at baseline Arousal/Alertness: Awake/alert Orientation Level: Oriented to person Attention: Sustained;Selective Selective Attention: Impaired Selective Attention Impairment: Functional basic Memory: Impaired Memory Impairment: Storage deficit;Decreased recall of new information Awareness: Impaired Awareness Impairment: Intellectual impairment Problem Solving: Impaired Problem Solving Impairment: Functional basic;Verbal basic Executive Function:  (all impairments) Behaviors: Impulsive Safety/Judgment: Impaired [employer] Scales of Cognitive Functioning: Automatic/appropriate Sensation Sensation Light Touch: Impaired Detail Light Touch Impaired Details: Impaired RUE;Impaired RLE Stereognosis: Appears Intact Hot/Cold: Appears Intact Proprioception: Impaired  Detail Proprioception Impaired Details: Impaired RLE;Impaired RUE Additional Comments: 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 Coordination Gross Motor Movements are Fluid and Coordinated: No Fine Motor Movements are Fluid and Coordinated: No Coordination and Movement Description: R sided weakness and functional use improved from eval Motor  Motor  Motor: Hemiplegia Motor - Discharge Observations: improved coordination  Mobility  Transfers Transfers: Sit to Stand;Stand to Sit Sit to Stand: 5: Supervision Stand to Sit: 5: Supervision  Trunk/Postural Assessment  Cervical Assessment Cervical Assessment: Exceptions to [facility] (forward) Thoracic Assessment Thoracic Assessment: Exceptions to [facility] (forward flexed) Lumbar Assessment Lumbar Assessment: Exceptions to [facility] Postural Control Postural Control: Deficits on evaluation Protective Responses: delayed  Balance   Extremity/Trunk Assessment RUE Assessment RUE Assessment: Exceptions to [facility] RUE Strength RUE Overall Strength Comments: Overall 3-/5 decr coordination LUE Assessment LUE Assessment: Within Functional Limits   See Function Navigator for Current Functional Status.  [name] [facility] 05/26/2016, 3:51 PM

## 2016-05-25 NOTE — Progress Notes (Addendum)
Occupational Therapy Session Note  Patient Details  Name: James [name] MRN: [id number] Date of Birth: [birth year]  Today's Date: 05/25/2016  Session 1 OT Individual Time: 1000-1100 OT Individual Time Calculation (min): 60 min   Session 2 OT Individual Time: 1430-1500 OT Individual Time Calculation (min): 30 min   Short Term Goals: Week 2:  OT Short Term Goal 1 (Week 2): STG=LTG 2/2 estimated dc date  Skilled Therapeutic Interventions/Progress Updates:    Session 1 1:1 OT session focused on grooming tasks, sequencing within ADLs, standing balance, trunk extension, and bilateral coordination. Pt able to don deodorant with min demonstrational cues from [place]. Pt then completed toothbrushing task at the sink and utilized object appropriately with Min A. Stand-pivot transfers completed with set-up and min A. OT facilitated lateral trunk extension, weight shifting, R UE strengthening, and trunk rotation using horse shoes and tall basketball goal. Pt required Min/Mod A to maintain standing balance when reaching overhead to R and outside base of support. Max instructional cues to sequence activity, progressing to Mod questioning cues with repetition. Pt returned to room at end of session and left supine in bed with 4 rails up and bed alarm on per safety plan.    Session 2 1:1 OT session focused on standing balance, R side attention, and fine-motor coordination. Dynavision used in standing w/ R UE and visual scanning across all 4 quadrants. Decreased cues required to attend to R side and Min/Mod A to maintain standing balance. Bilateral hand coordination using graded beading task in moderately distracting environment. Min cues to follow commands initially, progressing to mod/max A with increasingly distractible environment. Pt returned to room at end of session and left with needs met.   Therapy Documentation Precautions:  Precautions Precautions: Fall Precaution Comments: residual deficits from 2005  TBI/CVA, impaired vision, R sided weakness Restrictions Weight Bearing Restrictions: No Pain: Pain Assessment Pain Assessment: Faces Faces Pain Scale: No hurt ADL: ADL ADL Comments: See functional navigator   See Function Navigator for Current Functional Status.   Therapy/Group: Individual Therapy  Valma Cava 05/25/2016, 4:07 PM

## 2016-05-25 NOTE — Progress Notes (Signed)
Physical Therapy Session Note  Patient Details  Name: James [name] MRN: [id number] Date of Birth: 1940/08/10  Today's Date: 05/25/2016 PT Individual Time: [id number] and [id number] PT Individual Time Calculation (min): 28 min and 58 min   Short Term Goals: Week 2:  PT Short Term Goal 1 (Week 2): = LTGs due to anticipated LOS  Skilled Therapeutic Interventions/Progress Updates:    Treatment 1: Patient in bed upon arrival with wife present in room. Session focused bed mobility with supervision, sit <> stand transfers with close supervision, gait training using SPC 2 x 150 ft with min A overall and min cues for R attention for obstacle negotiation, dynamic standing balance and RLE NMR with SPC in LUE for attempted dual cognitive task with RLE taps to different numbers but patient unable to follow commands despite max verbal/visual/tactile cues therefore switched to alternating step taps to 4" step to fatigue with min A overall and mod verbal cues for sequencing, attending to R body and functional use of RUE to pet dog while seated EOM during visit with recreational therapy, gait training while carrying physioball in BUE x 80 ft with min A overall, and gait training back to room using SPC with no cues for path finding. Patient left in bed with wife and visitors present, wife verbalized understanding to notify nursing before she leaves.   Treatment 2: Patient at RN station, appearing fatigued but agreeable to therapy. Gait training using SPC x 75 ft + 150 ft + 150 ft with min A overall on carpet and tiled surfaces. In day room, patient engaged in standing tolerance and dynamic balance task carrying watering can and watering plants on countertop using primarily LUE and RUE for assist as needed without device with supervision-steady assist including filling up watering can at sink. Performed Dynavision mode A to address visual scanning and attention to R x 2 trials of 28 hits and 22 hits (more targets in right  lower quadrant) with supervision for standing balance with patient electing to use LUE only. Engaged in [place] to address RUE neuro re-ed via forced use in mode A using R upper and lower quadrants only x 2 trials of 12 hits and 14 hits with mod verbal cues to attend to R side of board and tactile cues to restrain use of LUE. Patient demonstrates high fall risk as noted by score of 28/56 on Berg Balance Scale. Patient ambulated back to room using [place] with steady assist and left semi reclined in bed after discussing with RN with bed alarm engaged and all 4 rails up with call bell in reach.   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, impaired vision, R sided weakness Restrictions Weight Bearing Restrictions: No Pain: Pain Assessment Pain Assessment: Faces Faces Pain Scale: No hurt Balance: Balance Balance Assessed: Yes Standardized Balance Assessment Standardized Balance Assessment: Berg Balance Test Berg Balance Test Sit to Stand: Able to stand  independently using hands Standing Unsupported: Able to stand safely 2 minutes Sitting with Back Unsupported but Feet Supported on Floor or Stool: Able to sit safely and securely 2 minutes Stand to Sit: Sits independently, has uncontrolled descent Transfers: Able to transfer with verbal cueing and /or supervision Standing Unsupported with Eyes Closed: Able to stand 10 seconds with supervision Standing Ubsupported with Feet Together: Needs help to attain position and unable to hold for 15 seconds From Standing, Reach Forward with Outstretched Arm: Reaches forward but needs supervision From Standing Position, Pick up  Object from Floor: Able to pick up shoe, needs supervision From Standing Position, Turn to Look Behind Over each Shoulder: Turn sideways only but maintains balance Turn 360 Degrees: Needs close supervision or verbal cueing Standing Unsupported,  Alternately Place Feet on Step/Stool: Able to complete >2 steps/needs minimal assist Standing Unsupported, One Foot in Front: Able to take small step independently and hold 30 seconds Standing on One Leg: Tries to lift leg/unable to hold 3 seconds but remains standing independently Total Score: 28/56   See Function Navigator for Current Functional Status.   Therapy/Group: Individual Therapy  Laretta Alstrom 05/25/2016, 2:08 PM

## 2016-05-26 ENCOUNTER — Inpatient Hospital Stay (HOSPITAL_COMMUNITY): Payer: Medicare Other | Admitting: Speech Pathology

## 2016-05-26 ENCOUNTER — Inpatient Hospital Stay (HOSPITAL_COMMUNITY): Payer: Medicare Other | Admitting: Physical Therapy

## 2016-05-26 ENCOUNTER — Inpatient Hospital Stay (HOSPITAL_COMMUNITY): Payer: Medicare Other | Admitting: Occupational Therapy

## 2016-05-26 DIAGNOSIS — S069X9S Unspecified intracranial injury with loss of consciousness of unspecified duration, sequela: Secondary | ICD-10-CM

## 2016-05-26 DIAGNOSIS — G479 Sleep disorder, unspecified: Secondary | ICD-10-CM | POA: Diagnosis present

## 2016-05-26 LAB — BASIC METABOLIC PANEL
Anion gap: 8 (ref 5–15)
BUN: 14 mg/dL (ref 6–20)
CHLORIDE: 93 mmol/L — AB (ref 101–111)
CO2: 31 mmol/L (ref 22–32)
CREATININE: 0.78 mg/dL (ref 0.61–1.24)
Calcium: 9.1 mg/dL (ref 8.9–10.3)
GFR calc non Af Amer: >60 mL/min (ref >60)
Glucose, Bld: 109 mg/dL — ABNORMAL HIGH (ref 65–99)
POTASSIUM: 4.1 mmol/L (ref 3.5–5.1)
SODIUM: 132 mmol/L — AB (ref 135–145)

## 2016-05-26 MED ORDER — METOPROLOL TARTRATE 50 MG PO TABS: 50.0000 mg | ORAL_TABLET | Freq: Two times a day (BID) | ORAL | Status: AC

## 2016-05-26 MED ORDER — ACETAMINOPHEN 325 MG PO TABS: 325.0000 mg | ORAL_TABLET | ORAL | Status: AC | PRN

## 2016-05-26 MED ORDER — BETHANECHOL CHLORIDE 25 MG PO TABS
25.0000 mg | ORAL_TABLET | Freq: Four times a day (QID) | ORAL | Status: DC
  Administered 2016-05-26 – 2016-05-27 (×3): 25 mg via ORAL
  Filled 2016-05-26 (×3): qty 1

## 2016-05-26 MED ORDER — LIDOCAINE HCL 2 % EX GEL: CUTANEOUS | 0 refills | Status: AC | PRN

## 2016-05-26 MED ORDER — FAMOTIDINE 20 MG PO TABS: 20.0000 mg | ORAL_TABLET | Freq: Two times a day (BID) | ORAL | Status: AC

## 2016-05-26 NOTE — Progress Notes (Signed)
[place] PHYSICAL MEDICINE & REHABILITATION     PROGRESS NOTE  Subjective/Complaints:  Pt seen laying in bed.  Per sleep chart, pt was awake again at 2AM?  ROS: Unable to assess due to language  Objective: Vital Signs: Blood pressure (!) 146/76, pulse 71, temperature 98 F (36.7 C), temperature source Oral, resp. rate 18, height 5\' 11"  (1.803 m), weight 97.3 kg (214 lb 9.6 oz), SpO2 99 %. Dg Swallowing Func-speech Pathology  Result Date: 05/25/2016 Objective Swallowing Evaluation: Type of Study: MBS-Modified Barium Swallow Study Patient Details Name: James [name] MRN: [id number] Date of Birth: May 10, 1941 Today's Date: 05/25/2016 Time: 0927- 0945; 18 min Past Medical History: Past Medical History: Diagnosis Date . Alcohol abuse, in remission  . Atrial fibrillation ([place])  . Bilateral renal masses  . Closed right ankle fracture  . 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 Gait disorder  . Gout  . Hypertension  . Seizure disorder ([place])  . 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  Past Surgical History: Past Surgical History: Procedure Laterality Date . CRANIECTOMY FOR DEPRESSED SKULL FRACTURE  2009  with MRSA infection . CRANIOTOMY    X 2 . HERNIA REPAIR    during childhood . ORIF ANKLE FRACTURE Left 2012 HPI: James [name] is a 75 year old male with history of TBI with SDH s/p crani 2005 and 2009 with residual aphasia, visual deficits and right hemiparesis, ETOH abuse, gout, seizure disorder who was admitted to [facility] on 05/04/16 after [place]. He was found to have 9 mm left IPH, bilateral SDH and SAH. NS recommended conservative care with serial CT. Follow up CT head with increase in bleed but neurologically reported to be improving. NO surgical intervention needed per NS. He was treated with rocephin for UTI. His diet upgraded to regular thin  with small bits and cues to clear residuals due to impulsivity. He has had worsening of right sided weakness, increase in visual deficits and worsening of aphasia. Downgraded to dys 1, nectar thick liquids due to decline in function on admission to CIR.  Repeat objective assessment ordered today to determine readiness for advancement given improvements noted on bedside  No Data Recorded Assessment / Plan / Recommendation CHL IP CLINICAL IMPRESSIONS 05/25/2016 Therapy Diagnosis Mild oral phase dysphagia;Mild pharyngeal phase dysphagia Clinical Impression Pt presents with improvements in swallowing function in comparison to previous assessment. Pt demonstrates timely swallow initiation with thickened liquids with high flash penetration during the swallow (age appropriate).  Swallow response was delayed to the pyriforms with thin liquids but pt remained able to protect airway with only intermittent flash penetration during consumption of liquids alone.  Pt with severe residue at the level of the vallecula with solids due to decreased attention to boluses with premature spillage of materials into the oropharynx, delayed swallow initiation, and decreased pharyngeal sensation which required multiple swallows to clear to moderate residue.  Liquid wash was effective for clearing residuals from the vallecula but did result in deep silent penetration to the level of the cords due to liquids spilling over the epiglottis and into the airway.  When bite sizes were closely controlled, vallecular residue was decreased to mild-moderate and liquid wash did not result in any significant airway compromise.  As a result, recommend that pt's diet be upgraded to dys 2 textures and thin liquids with full  supervision for use of swallowing precautions.  Suspect pt is near his baseline for swallowing function given wife's reports of impulsivity at baseline.  Prognosis for advancement is good with ongoing ST interventions to reinforce use of  swallowing precautions.   Impact on safety and function Mild aspiration risk      Prognosis 05/25/2016 Prognosis for Safe Diet Advancement Good Barriers to Reach Goals Cognitive deficits;Language deficits;Severity of deficits Barriers/Prognosis Comment -- CHL IP DIET RECOMMENDATION 05/25/2016 SLP Diet Recommendations Thin liquid;Dysphagia 2 (Fine chop) solids Liquid Administration via Cup Medication Administration Whole meds with puree Compensations Minimize environmental distractions;Slow rate;Small sips/bites;Lingual sweep for clearance of pocketing;Multiple dry swallows after each bite/sip Postural Changes Remain semi-upright after after feeds/meals (Comment)            CHL IP ORAL PHASE 05/25/2016 Oral Phase Impaired Oral - Pudding Teaspoon -- Oral - Pudding Cup -- Oral - Honey Teaspoon -- Oral - Honey Cup -- Oral - Nectar Teaspoon -- Oral - Nectar Cup WFL Oral - Nectar Straw -- Oral - Thin Teaspoon -- Oral - Thin Cup Decreased bolus cohesion;Premature spillage Oral - Thin Straw Decreased bolus cohesion;Premature spillage Oral - Puree Lingual/palatal residue Oral - Mech Soft -- Oral - Regular Lingual/palatal residue;Other (Comment) Oral - Multi-Consistency -- Oral - Pill -- Oral Phase - Comment --  CHL IP PHARYNGEAL PHASE 05/25/2016 Pharyngeal Phase Impaired Pharyngeal- Pudding Teaspoon -- Pharyngeal -- Pharyngeal- Pudding Cup -- Pharyngeal -- Pharyngeal- Honey Teaspoon -- Pharyngeal -- Pharyngeal- Honey Cup -- Pharyngeal -- Pharyngeal- Nectar Teaspoon -- Pharyngeal -- Pharyngeal- Nectar Cup WFL Pharyngeal -- Pharyngeal- Nectar Straw -- Pharyngeal -- Pharyngeal- Thin Teaspoon -- Pharyngeal -- Pharyngeal- Thin Cup Delayed swallow initiation-pyriform sinuses;Penetration/Aspiration during swallow Pharyngeal Material enters airway, remains ABOVE vocal cords then ejected out Pharyngeal- Thin Straw Delayed swallow initiation-pyriform sinuses;Penetration/Aspiration during swallow Pharyngeal Material enters airway, remains  ABOVE vocal cords then ejected out Pharyngeal- Puree Delayed swallow initiation-vallecula;Pharyngeal residue - valleculae Pharyngeal -- Pharyngeal- Mechanical Soft -- Pharyngeal -- Pharyngeal- Regular Delayed swallow initiation-vallecula;Pharyngeal residue - valleculae Pharyngeal -- Pharyngeal- Multi-consistency -- Pharyngeal -- Pharyngeal- Pill -- Pharyngeal -- Pharyngeal Comment --  CHL IP CERVICAL ESOPHAGEAL PHASE 05/25/2016 Cervical Esophageal Phase WFL Pudding Teaspoon -- Pudding Cup -- Honey Teaspoon -- Honey Cup -- Nectar Teaspoon -- Nectar Cup -- Nectar Straw -- Thin Teaspoon -- Thin Cup -- Thin Straw -- Puree -- Mechanical Soft -- Regular -- Multi-consistency -- Pill -- Cervical Esophageal Comment -- No flowsheet data found. Page, [name] L 05/25/2016, 11:23 AM              No results for input(s): WBC, HGB, HCT, PLT in the last 72 hours.  Recent Labs  05/25/16 0828  NA 129*  K 4.5  CL 91*  GLUCOSE 125*  BUN 11  CREATININE 0.78  CALCIUM 9.2   CBG (last 3)  No results for input(s): GLUCAP in the last 72 hours.  Wt Readings from Last 3 Encounters:  05/18/16 97.3 kg (214 lb 9.6 oz)  05/11/16 90.7 kg (200 lb)    Physical Exam:  BP (!) 146/76 (BP Location: Left Arm)   Pulse 71   Temp 98 F (36.7 C) (Oral)   Resp 18   Ht 5\' 11"  (1.803 m)   Wt 97.3 kg (214 lb 9.6 oz)   SpO2 99%   BMI 29.93 kg/m  Constitutional: He appears well-developedand well-nourished. NAD.  Head: Old healed incision on scalp Eyes: Conjunctivaeand EOMare normal.  Cardiovascular: Irregularly, irregular. No JVD. Respiratory: Breath  sounds normal. No stridor. No respiratory distress. He has no wheezes.  GI: Soft. Bowel sounds are normal. He exhibits no distension. There is no tenderness  Musculoskeletal: He exhibits no edemaor tenderness.  Neurological: He is alert.  Global aphasia (Expressive > Receptive) with apraxia, unchanged. Able to follow simple one step motor commands inconsistently  Motor  (Limited by aphasia, but grossly): RUE: ?3/5 (?improving - increased spontaneous usage) LUE 4+/5 RLE: 1+/5 LLE: 4+/5 Unable to assess tone due to pt movement Skin: Skin is warmand dry. Ecchymosis left posterior neck, LUE, left flank and left hip, stable.    Assessment/Plan: 1. Functional deficits secondary to left intraparenchymal hemorrhage, subarachnoid hemorrhage, subdural hemorrhage which require 3+ hours per day of interdisciplinary therapy in a comprehensive inpatient rehab setting. Physiatrist is providing close team supervision and 24 hour management of active medical problems listed below. Physiatrist and rehab team continue to assess barriers to discharge/monitor patient progress toward functional and medical goals.  Function:  Bathing Bathing position   Position: Shower  Bathing parts Body parts bathed by patient: Right arm, Left arm, Chest, Abdomen, Front perineal area, Right upper leg, Left upper leg, Right lower leg, Left lower leg Body parts bathed by helper: Buttocks, Back  Bathing assist Assist Level: Touching or steadying assistance(Pt > 75%)      Upper Body Dressing/Undressing Upper body dressing   What is the patient wearing?: Pull over shirt/dress     Pull over shirt/dress - Perfomed by patient: Thread/unthread left sleeve, Put head through opening, Pull shirt over trunk, Thread/unthread right sleeve Pull over shirt/dress - Perfomed by helper: Thread/unthread right sleeve        Upper body assist Assist Level: Supervision or verbal cues      Lower Body Dressing/Undressing Lower body dressing   What is the patient wearing?: Pants, Shoes, Socks     Pants- Performed by patient: Pull pants up/down, Thread/unthread right pants leg, Thread/unthread left pants leg Pants- Performed by helper: Thread/unthread right pants leg, Thread/unthread left pants leg   Non-skid slipper socks- Performed by helper: Don/doff right sock, Don/doff left sock Socks - Performed  by patient: Don/doff right sock, Don/doff left sock Socks - Performed by helper: Don/doff right sock, Don/doff left sock Shoes - Performed by patient: Don/doff right shoe, Don/doff left shoe Shoes - Performed by helper: Don/doff right shoe, Don/doff left shoe          Lower body assist Assist for lower body dressing: Supervision or verbal cues      Toileting Toileting   Toileting steps completed by patient: Adjust clothing prior to toileting, Adjust clothing after toileting Toileting steps completed by helper: Performs perineal hygiene Toileting Assistive Devices: Grab bar or rail  Toileting assist Assist level: Touching or steadying assistance (Pt.75%)   Transfers Chair/bed transfer Chair/bed transfer activity did not occur: N/A Chair/bed transfer method: Stand pivot, Ambulatory Chair/bed transfer assist level: Touching or steadying assistance (Pt > 75%) Chair/bed transfer assistive device: Armrests, Cane     Locomotion Ambulation     Max distance: 150 ft Assist level: Touching or steadying assistance (Pt > 75%)   Wheelchair   Type: Manual Max wheelchair distance: 100 ft Assist Level: Moderate assistance (Pt 50 - 74%)  Cognition Comprehension Comprehension assist level: Understands basic 50 - 74% of the time/ requires cueing 25 - 49% of the time  Expression Expression assist level: Expresses basic 25 - 49% of the time/requires cueing 50 - 75% of the time. Uses single words/gestures.  Social Interaction  Social Interaction assist level: Interacts appropriately 25 - 49% of time - Needs frequent redirection.  Problem Solving Problem solving assist level: Solves basic 25 - 49% of the time - needs direction more than half the time to initiate, plan or complete simple activities  Memory Memory assist level: Recognizes or recalls less than 25% of the time/requires cueing greater than 75% of the time     Medical Problem List and Plan: 1. Right hemiparesis, aphasia, cognitive  dysfunction, gait disorder secondary to left intraparenchymal hemorrhage, subarachnoid hemorrhage, subdural hemorrhage following a fall 05/04/2016  Cont CIR, plan for SNF tomorrow           Repeat Head CT reviewed, spoke with Radiologist, appears to be significant, but stable bleed  2. DVT Prophylaxis/Anticoagulation: Pharmaceutical: Lovenox restarted 3. Pain Management: Hydrocodone PRN.  4. Mood: Monitor for visual and behavioral changes. History of depression  Zoloft d/ced due to Na+ trending down, will cont to monitor 5. Neuropsych: This patient is not capable of making decisions on hisown behalf. 6. Skin/Wound Care: Routine pressure relief measures 7. Fluids/Electrolytes/Nutrition: Monitor I/O. Appetite reported to be good. 8. PAF: Monitor blood pressure and heart rate bid. Continue digoxin and Toprol XL.   Appreciate Cards recs, cont current meds, anticoagulation contraindicated due to recurrent bleeds  Cont meds 9. Seizure disorder: On keppra bid.  10. E coli UTI: Treated.  11. Urinary retention: Monitor voiding  Bethanechol 5 TID on 10/24, d/ced and monitor Na+  Toilet every 3-4 hours.  12. History of gout. Monitor for flareup 13. Review of primary care notes indicate has CODE STATUS: DO NOT RESUSCITATE, this was discussed with his wife, she will bring no code order from primary care physician.  14. HTN  Monitor with increased activity  Hypotensive yesterday evening, otherwise BP controlled 15. Hypokalemia: repleted  4.5 on 11/1 16. Hyponatremia  Na+ 129 on 11/1 (?Improving)  Zoloft d/ced 10/27  Bethanechol d/ced 10/30  Will cont to monitor 17. Recurrent UTI:   Remains Afebrile  WBCs 10.3 on 10/30  Urine neg, CXR 10/21 reviewed, negative for infection  Repeat Ucx with E. Faecalis, macrobid completed 11/1  LOS (Days) 14 A FACE TO FACE EVALUATION WAS PERFORMED  Ankit Lorie Phenix 05/26/2016 8:36 AM

## 2016-05-26 NOTE — Plan of Care (Signed)
Problem: RH Attention Goal: LTG Patient will demonstrate focused/sustained (SLP) LTG:  Patient will demonstrate focused/sustained/selective/alternating/divided attention during cognitive/linguistic activities in specific environment with assist for # of minutes   (SLP)  Outcome: Not Met (add Reason) Min-mod assist needed for redirection to task

## 2016-05-26 NOTE — Progress Notes (Addendum)
Social Work Patient ID: Loretha Stapler, male   DOB: 21-Jun-1941, 75 y.o.   MRN: [id number]  Have sent information in to [facility] for approval for SNF upon discharge. Spoke with [facility] and she reports She is sending to their Market researcher for approval and he may need to talk with MD here. Made Dr Posey Pronto aware and will await results. Continue to plan for transfer tomorrow. Wife is aware of the peer to peer with MD.

## 2016-05-26 NOTE — Progress Notes (Signed)
Physical Therapy Discharge Summary  Patient Details  Name: James [name] MRN: [id number] Date of Birth: 24-Jun-1941  Today's Date: 05/26/2016 PT Individual Time: 5176-1607 and 1310-1335 PT Individual Time Calculation (min): 55 min and 25 min  Patient has met 8 of 8 long term goals due to improved activity tolerance, improved balance, improved postural control, increased strength, decreased pain, ability to compensate for deficits, functional use of  right upper extremity and right lower extremity, improved attention, improved awareness and improved coordination.  Patient to discharge at an ambulatory level [place]. Patient's care partner is unable to provide the necessary physical and cognitive assistance at discharge, therefore discharge planned to SNF for further rehabilitation.  Reasons goals not met: NA  Recommendation:  Patient will benefit from ongoing skilled PT services in skilled nursing facility setting to continue to advance safe functional mobility, address ongoing impairments in R attention, strength, standing balance, balance strategies, safety, activity tolerance, attention, and minimize fall risk.  Equipment: No equipment provided  Reasons for discharge: treatment goals met and discharge from hospital  Patient/family agrees with progress made and goals achieved: Yes  Skilled Therapeutic Intervention Treatment 1: Session focused on functional mobility, NMR, standing balance, activity tolerance in preparation for discharge planned to SNF for further rehabilitation. Patient ambulated throughout rehab unit in controlled and home environments including up/down ramp and across uneven mulch surfaces multiple trials > 150 ft without device with min A overall and cues for R attention, simulated car transfer with min A and cues for safety and technique, stair training up/down 12 (6") stairs using 2 rails with reciprocal pattern and close supervision, retrieving object from floor with  close supervision, bed mobility on regular bed with supervision, sit <> stand transfers with supervision, and furniture transfers to low compliant couch surface and ambulatory transfers without device with min A. Performed NuStep using BLE and alternating UE or BUE at level 5 x 10 min. Patient ambulated back to room with min A and returned to bed with supervision after declining Geri[employer]. Patient left semi reclined with 4 rails up per family request and bed alarm on, RN and next therapist notified of patient position.   Treatment 2: Patient asleep in [place] upon arrival. Gait training using SPC 2 x 150 ft with min A overall and cues for R attention for obstacle negotiation. Seated EOM, patient engaged in 3D pipetree with focus on RUE neuro re-ed via forced use with therapist restraining patient from using LUE. Patient left semi reclined in bed with 4 rails up per family request and bed alarm on with call bell in reach.     PT Discharge Precautions/RestrictionsPrecautions Precautions: Fall Precaution Comments: impulsive, impaired vision, R hemiplegia Restrictions Weight Bearing Restrictions: No Pain Pain Assessment Pain Assessment: Faces Faces Pain Scale: No hurt Vision/Perception   Defer to OT discharge summary  Cognition Overall Cognitive Status: History of cognitive impairments - at baseline Arousal/Alertness: Awake/alert Orientation Level: Oriented to person Attention: Sustained Sustained Attention: Impaired Sustained Attention Impairment: Verbal basic;Functional basic Memory: Impaired Memory Impairment: Storage deficit Awareness: Impaired Awareness Impairment: Intellectual impairment Problem Solving: Impaired Problem Solving Impairment: Functional basic;Verbal basic Executive Function:  (all impaired due to lower level deficits) Behaviors: Impulsive Safety/Judgment: Impaired [employer] Scales of Cognitive Functioning: Automatic/appropriate Sensation Sensation Light  Touch: Impaired Detail Light Touch Impaired Details: Impaired RUE;Impaired RLE Proprioception: Impaired Detail Proprioception Impaired Details: Impaired RLE;Impaired RUE Coordination Gross Motor Movements are Fluid and Coordinated: No Fine Motor Movements are Fluid and Coordinated: No Coordination and  Movement Description: R sided weakness and functional use improved from eval Motor  Motor Motor: Hemiplegia  Mobility Bed Mobility Bed Mobility: Supine to Sit;Sit to Supine Supine to Sit: 5: Supervision;HOB flat Sit to Supine: 5: Supervision;HOB flat Transfers Transfers: Yes Sit to Stand: 5: Supervision Stand to Sit: 5: Supervision Stand Pivot Transfers: 4: Min assist Locomotion  Ambulation Ambulation: Yes Ambulation/Gait Assistance: 4: Min assist Ambulation Distance (Feet): 150 Feet Assistive device: None Gait Gait: Yes Gait Pattern: Impaired Gait Pattern: Step-through pattern;Decreased weight shift to right;Decreased stance time - right;Decreased step length - left;Lateral trunk lean to left;Trunk flexed;Decreased trunk rotation;Poor foot clearance - right Gait velocity: decreased Stairs / Additional Locomotion Stairs: Yes Stairs Assistance: 5: Supervision Stair Management Technique: One rail Left;Forwards;Alternating pattern Number of Stairs: 12 Height of Stairs: 6 Ramp: 4: Min Administrator Mobility: No  Trunk/Postural Assessment  Cervical Assessment Cervical Assessment: Exceptions to [facility] (forward head) Thoracic Assessment Thoracic Assessment: Exceptions to [facility] (rounded shoulders/kyphotic) Lumbar Assessment Lumbar Assessment: Exceptions to [facility] (posterior pelvic tilt) Postural Control Postural Control: Deficits on evaluation Protective Responses: impaired/insufficient  Balance Balance Balance Assessed: Yes Standardized Balance Assessment Standardized Balance Assessment: Berg Balance Test Berg Balance Test Sit to Stand: Able to stand   independently using hands Standing Unsupported: Able to stand safely 2 minutes Sitting with Back Unsupported but Feet Supported on Floor or Stool: Able to sit safely and securely 2 minutes Stand to Sit: Sits independently, has uncontrolled descent Transfers: Able to transfer with verbal cueing and /or supervision Standing Unsupported with Eyes Closed: Able to stand 10 seconds with supervision Standing Ubsupported with Feet Together: Needs help to attain position and unable to hold for 15 seconds From Standing, Reach Forward with Outstretched Arm: Reaches forward but needs supervision From Standing Position, Pick up Object from Floor: Able to pick up shoe, needs supervision From Standing Position, Turn to Look Behind Over each Shoulder: Turn sideways only but maintains balance Turn 360 Degrees: Needs close supervision or verbal cueing Standing Unsupported, Alternately Place Feet on Step/Stool: Able to complete >2 steps/needs minimal assist Standing Unsupported, One Foot in Front: Able to take small step independently and hold 30 seconds Standing on One Leg: Tries to lift leg/unable to hold 3 seconds but remains standing independently Total Score: 28 Static Standing Balance Static Standing - Balance Support: Left upper extremity supported;During functional activity Static Standing - Level of Assistance: 5: Stand by assistance Dynamic Standing Balance Dynamic Standing - Balance Support: Left upper extremity supported;During functional activity Dynamic Standing - Level of Assistance: 4: Min assist Extremity Assessment  RLE Assessment RLE Assessment: Within Functional Limits LLE Assessment LLE Assessment: Within Functional Limits   See Function Navigator for Current Functional Status.  Carney Living A 05/26/2016, 8:02 AM

## 2016-05-26 NOTE — Progress Notes (Signed)
Occupational Therapy Session Note  Patient Details  Name: [name] MRN: [id number] Date of Birth: 12-30-1940  Today's Date: 05/26/2016 OT Individual Time: 1430-1500 OT Individual Time Calculation (min): 30 min     Short Term Goals:Week 2:  OT Short Term Goal 1 (Week 2): STG=LTG 2/2 estimated dc date  Skilled Therapeutic Interventions/Progress Updates:    Pt seen for OT session addressing cognition and R neuro re-ed. Pt asleep in supine upon arrival, easily awoken and agreeable to tx session.  He completed colored peg board task seated EOB. Focus on pt replicating  Pattern from picture. Able to complete very basic pattern with increased time. Slightly more difficult pattern completed with min VCs and with increased time pt able to self- correct. Required mod-max verbal and tactile cuing for use of R UE to complete task. Pt demonstrated poor in-hand manipulation skills to turn pieces within hand, trying to stabilize piece in mouth to assist with turning piece.  Pt required mod-max cuing for locating box of pegs placed by R hip. Pt returned to supine at end of session, bed alarm on, and all needs in reach.   Therapy Documentation Precautions:  Precautions Precautions: Fall Precaution Comments: impulsive, impaired vision, R hemiplegia Restrictions Weight Bearing Restrictions: No ADL: ADL ADL Comments: See functional navigator  See Function Navigator for Current Functional Status.   Therapy/Group: Individual Therapy  Lewis, Kadisha Goodine C 05/26/2016, 3:17 PM

## 2016-05-26 NOTE — Progress Notes (Signed)
Speech Language Pathology Discharge Summary  Patient Details  Name: James [name] MRN: [id number] Date of Birth: 07-29-1940  Today's Date: 05/26/2016 SLP Individual Time: 0800-0900; [id number] SLP Individual Time Calculation (min): 60 min; 25 min     Skilled Therapeutic Interventions:   Session 1:  Pt was seen for skilled ST targeting goals for dysphagia and communication.  Therapist facilitated the session with careful portion control by only presenting pt with 1 food item at a time during breakfast meal.  Pt initially required max assist for timeliness of swallow initiation as well as for rate and portion control; however, as session progressed with ongoing presentation of only item at a time, therapist was able to fade cues to min assist for use of swallowing precautions.  Pt with slight increase in chest congestion noted prior to PO intake.  RN reported that lungs sounded clear during morning assessment.  Pt able to orally expectorate a moderate amount of thin, pale green secretions which cleared respirations well.  Pt also remains afebrile and O2 sats WFL on room air.  Pt was able to convey meal choices to James [name] via yes/no responses with mod assist verbal cues to verify.  Pt was able to follow 1 and 2 step commands while completing oral care with min-mod assist verbal and visual cues.  Pt left in wheelchair with quick release belt donned at nursing station.   Session 2:  Pt was seen for skilled ST targeting goals for communication.  Upon arrival, pt was laying in bed with underwear and shorts down around his ankles, visibly soiled with urine.  When asked if he needed help changing he verbalized yes.  Pt was able to don clean underwear and shorts with intermittent steadying assist in standing position following set up for obtaining clothing items.  Pt was able to identify object when named from a field of 2 for 100% accuracy with min assist to recognize and correct errors.  Pt was also  able to write his name and wife's name with supervision.  He needed a visual model to copy when writing his birth date.  Written expression is only legible in context.  Pt was left in bed with bed alarm set at the end of today's therapy session.  Pt is ready for discharge to SNF tomorrow.      Patient has met 3 of 4 long term goals.  Patient to discharge at overall Mod level.  Reasons goals not met: pt needs min-mod assist verbal cues for redirection to tasks    Clinical Impression/Discharge Summary:   Pt has made functional gains while inpatient and is discharging having met 3 out of 4 long term goals.  Pt's diet has been upgraded to dys 2 textures and thin liquids which pt is consuming with min cues following careful set up of tray to facilitate rate and portion control and attention to boluses orally. 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  Pt is overall min-mod assist for functional, multimodal communication.  Pt is discharging to SNF where it is recommended that he receive ongoing ST interventions to address cognitive-linguistic and swallowing function.  Also recommend ongoing family education at next level of care given inconsistent family attendance in therapies while inpatient.    Care Partner:  Caregiver Able to Provide Assistance:  (  SNF)  Type of Caregiver Assistance:  (SNF)  Recommendation:  Home Health SLP;24 hour supervision/assistance;Skilled Nursing facility  Rationale for SLP Follow Up: Maximize functional communication;Maximize swallowing safety;Maximize cognitive function and independence;Reduce caregiver burden   Equipment: none recommended by SLP    Reasons for discharge: Discharged from hospital   Patient/Family Agrees with Progress Made  and Goals Achieved: Yes   Function:  Eating Eating   Modified Consistency Diet: Yes Eating Assist Level: Supervision or verbal cues;Help managing cup/glass;Set up assist for   Eating Set Up Assist For: Opening containers       Cognition Comprehension Comprehension assist level: Understands basic 50 - 74% of the time/ requires cueing 25 - 49% of the time  Expression   Expression assist level: Expresses basic 25 - 49% of the time/requires cueing 50 - 75% of the time. Uses single words/gestures.  Social Interaction Social Interaction assist level: Interacts appropriately 25 - 49% of time - Needs frequent redirection.  Problem Solving Problem solving assist level: Solves basic 25 - 49% of the time - needs direction more than half the time to initiate, plan or complete simple activities  Memory Memory assist level: Recognizes or recalls less than 25% of the time/requires cueing greater than 75% of the time   James [name] L 05/26/2016, 4:09 PM

## 2016-05-27 DIAGNOSIS — S065X0A Traumatic subdural hemorrhage without loss of consciousness, initial encounter: Secondary | ICD-10-CM

## 2016-05-27 MED ORDER — BETHANECHOL CHLORIDE 25 MG PO TABS: 25.0000 mg | ORAL_TABLET | Freq: Four times a day (QID) | ORAL | Status: AC

## 2016-05-27 NOTE — Progress Notes (Signed)
Social Work  Discharge Note  The overall goal for the admission was met for:   Discharge location: Yes-[facility]-SNF  Length of Stay: Yes-15 DAYS  Discharge activity level: Yes-MIN LEVEL OF ASSIST  Home/community participation: Yes  Services provided included: MD, RD, PT, OT, SLP, RN, CM, TR, Pharmacy and SW  Financial Services: Private Insurance: BLUE MEDICARE  Follow-up services arranged: Other: SNF-[facility]  Comments (or additional information):PT IS NOT AT A LEVEL WHERE WIFE CAN MANAGE HIM AT HOME, HOPEFULLY AFTER SNF STAY CAN [place].  Patient/Family verbalized understanding of follow-up arrangements: Yes  Individual responsible for coordination of the follow-up plan: [name]  Confirmed correct DME delivered: James [name] 05/27/2016    James [name]

## 2016-05-27 NOTE — Progress Notes (Signed)
Called report to Sidonie Dickens, RN at [facility] in [place]. Patient taken to [facility] by spouse. Patient belongings taken with patient.

## 2016-05-27 NOTE — Progress Notes (Signed)
[facility] PHYSICAL MEDICINE & REHABILITATION     PROGRESS NOTE  Subjective/Complaints:  Pt laying in bed this AM.  His diet was advanced recently, no reported issues.  Sleep chart not updated.  He is scheduled to be d/ced to nursing home today.   ROS: Unable to assess due to language  Objective: Vital Signs: Blood pressure 117/65, pulse 75, temperature 98.2 F (36.8 C), temperature source Oral, resp. rate 18, height 5\' 11"  (1.803 m), weight 97.3 kg (214 lb 9.6 oz), SpO2 98 %. No results found. No results for input(s): WBC, HGB, HCT, PLT in the last 72 hours.  Recent Labs  05/25/16 0828 05/26/16 0837  NA 129* 132*  K 4.5 4.1  CL 91* 93*  GLUCOSE 125* 109*  BUN 11 14  CREATININE 0.78 0.78  CALCIUM 9.2 9.1   CBG (last 3)  No results for input(s): GLUCAP in the last 72 hours.  Wt Readings from Last 3 Encounters:  05/18/16 97.3 kg (214 lb 9.6 oz)  05/11/16 90.7 kg (200 lb)    Physical Exam:  BP 117/65 (BP Location: Right Arm)   Pulse 75   Temp 98.2 F (36.8 C) (Oral)   Resp 18   Ht 5\' 11"  (1.803 m)   Wt 97.3 kg (214 lb 9.6 oz)   SpO2 98%   BMI 29.93 kg/m  Constitutional: He appears well-developedand well-nourished. NAD.  Head: Old healed incision on scalp Eyes: Conjunctivaeand EOMare normal.  Cardiovascular: Irregularly, irregular. No JVD. Respiratory: Breath sounds normal. No stridor. No respiratory distress. He has no wheezes.  GI: Soft. Bowel sounds are normal. He exhibits no distension. There is no tenderness  Musculoskeletal: He exhibits no edemaor tenderness.  Neurological: He is alert.  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 Able to follow simple one step motor commands inconsistently  Motor (Limited by aphasia, but grossly): RUE: ?>3+/5 (improving spontaneous usage) LUE 4+/5 RLE: ?>3/5 LLE: 4+/5 Unable to assess tone due to pt movement Skin: Skin is warmand dry. Ecchymosis left posterior neck, LUE, left flank and left  hip, stable.    Assessment/Plan: 1. Functional deficits secondary to left intraparenchymal hemorrhage, subarachnoid hemorrhage, subdural hemorrhage which require 3+ hours per day of interdisciplinary therapy in a comprehensive inpatient rehab setting. Physiatrist is providing close team supervision and 24 hour management of active medical problems listed below. Physiatrist and rehab team continue to assess barriers to discharge/monitor patient progress toward functional and medical goals.  Function:  Bathing Bathing position   Position: Shower  Bathing parts Body parts bathed by patient: Right arm, Left arm, Chest, Abdomen, Front perineal area, Right upper leg, Left upper leg, Right lower leg, Left lower leg, Buttocks Body parts bathed by helper: Buttocks, Back  Bathing assist Assist Level: Supervision or verbal cues      Upper Body Dressing/Undressing Upper body dressing   What is the patient wearing?: Pull over shirt/dress     Pull over shirt/dress - Perfomed by patient: Thread/unthread left sleeve, Put head through opening, Pull shirt over trunk, Thread/unthread right sleeve Pull over shirt/dress - Perfomed by helper: Thread/unthread right sleeve        Upper body assist Assist Level: Supervision or verbal cues      Lower Body Dressing/Undressing Lower body dressing   What is the patient wearing?: Pants, Shoes, Socks, Underwear Underwear - Performed by patient: Thread/unthread right underwear leg, Thread/unthread left underwear leg, Pull underwear up/down   Pants- Performed by patient: Thread/unthread right pants leg, Thread/unthread left pants  leg, Pull pants up/down Pants- Performed by helper: Thread/unthread right pants leg, Thread/unthread left pants leg   Non-skid slipper socks- Performed by helper: Don/doff right sock, Don/doff left sock Socks - Performed by patient: Don/doff right sock, Don/doff left sock Socks - Performed by helper: Don/doff right sock, Don/doff  left sock Shoes - Performed by patient: Don/doff right shoe, Don/doff left shoe Shoes - Performed by helper: Don/doff right shoe, Don/doff left shoe          Lower body assist Assist for lower body dressing: Supervision or verbal cues      Toileting Toileting   Toileting steps completed by patient: Adjust clothing prior to toileting, Performs perineal hygiene, Adjust clothing after toileting Toileting steps completed by helper: Performs perineal hygiene Toileting Assistive Devices: Grab bar or rail  Toileting assist Assist level: Set up/obtain supplies   Transfers Chair/bed transfer Chair/bed transfer activity did not occur: N/A Chair/bed transfer method: Ambulatory Chair/bed transfer assist level: Set up only Chair/bed transfer assistive device: Armrests     Locomotion Ambulation     Max distance: 150 ft Assist level: Touching or steadying assistance (Pt > 75%)   Wheelchair   Type: Manual Max wheelchair distance: 100 ft Assist Level: Moderate assistance (Pt 50 - 74%)  Cognition Comprehension Comprehension assist level: Understands basic 75 - 89% of the time/ requires cueing 10 - 24% of the time  Expression Expression assist level: Expresses basic 25 - 49% of the time/requires cueing 50 - 75% of the time. Uses single words/gestures.  Social Interaction Social Interaction assist level: Interacts appropriately 50 - 74% of the time - May be physically or verbally inappropriate.  Problem Solving Problem solving assist level: Solves basic less than 25% of the time - needs direction nearly all the time or does not effectively solve problems and may need a restraint for safety  Memory Memory assist level: Recognizes or recalls less than 25% of the time/requires cueing greater than 75% of the time     Medical Problem List and Plan: 1. Right hemiparesis, aphasia, cognitive dysfunction, gait disorder secondary to left intraparenchymal hemorrhage, subarachnoid hemorrhage, subdural  hemorrhage following a fall 05/04/2016  D/c to SNF            Repeat Head CT reviewed, spoke with Radiologist, appears to be significant, but stable bleed   Will see pt in 1 month for follow up 2. DVT Prophylaxis/Anticoagulation: Pharmaceutical: Lovenox restarted 3. Pain Management: Hydrocodone PRN.  4. Mood: Monitor for visual and behavioral changes. History of depression  Zoloft d/ced due to Na+ trending down, will cont to monitor, may resume at SNF 5. Neuropsych: This patient is not capable of making decisions on hisown behalf. 6. Skin/Wound Care: Routine pressure relief measures 7. Fluids/Electrolytes/Nutrition: Monitor I/O. Appetite reported to be good.  Diet advanced to D2 Thin 8. PAF: Monitor blood pressure and heart rate bid. Continue digoxin and Toprol XL.   Appreciate Cards recs, cont current meds, anticoagulation contraindicated due to recurrent bleeds  Cont meds 9. Seizure disorder: On keppra bid.  10. E coli UTI: Treated.  11. Urinary retention: Monitor voiding  Bethanechol d/ced 10/24, resumed 11/2, adjust as necessary  Toilet every 3-4 hours.  12. History of gout. Monitor for flareup 13. Review of primary care notes indicate has CODE STATUS: DO NOT RESUSCITATE, this was discussed with his wife, she will bring no code order from primary care physician.  14. HTN  Monitor with increased activity  Overall BP controlled 15. Hypokalemia: repleted  4.5 on 11/1 16. Hyponatremia:m Improving, likely from abx  Na+ 132 on 11/2   Zoloft d/ced 10/27  Bethanechol d/ced 10/30, resumed 11/2  Will cont to monitor 17. Recurrent UTI:   Remains Afebrile  WBCs 10.3 on 10/30  Urine neg, CXR 10/21 reviewed, negative for infection  Repeat Ucx with E. Faecalis, macrobid completed 11/1  LOS (Days) 15 A FACE TO FACE EVALUATION WAS PERFORMED  Emanuel Dowson Lorie Phenix 05/27/2016 10:11 AM

## 2016-05-27 NOTE — H&P (Deleted)
Called report to Sidonie Dickens, RN at [facility] in [place]. Patient taken to [facility] by spouse. Patient belongings taken with patient.

## 2016-05-27 NOTE — Progress Notes (Signed)
Pt observed walking up to staff nurse sitting at nurses station. Staff nurse assists pt.back to room via w/c and provide I&O cath with output 400 ml. Amber clear urine. Tolerates procedure well. Bed alarm activated and TV turned on for pt observe pt. Watching TV.

## 2016-05-27 NOTE — Progress Notes (Signed)
Social Work Patient ID: James [name], male   DOB: 1940/11/14, 75 y.o.   MRN: [id number]  Insurance has approved SNF coverage contacted wife this am, very pleased with. She will be here at 11:00 for discharge and to transport to [facility].

## 2016-05-27 NOTE — Progress Notes (Signed)
2000 PVR 290 I&O cath not performed as resident incontinent of urine. Complete linen change with NT present and pericare provided. Sleeping sound during night w/o concerns. Continue to monitor.

## 2016-05-31 DIAGNOSIS — I482 Chronic atrial fibrillation: Secondary | ICD-10-CM | POA: Diagnosis not present

## 2016-05-31 DIAGNOSIS — S066X0A Traumatic subarachnoid hemorrhage without loss of consciousness, initial encounter: Secondary | ICD-10-CM | POA: Diagnosis not present

## 2016-05-31 DIAGNOSIS — R339 Retention of urine, unspecified: Secondary | ICD-10-CM | POA: Diagnosis not present

## 2016-05-31 DIAGNOSIS — I10 Essential (primary) hypertension: Secondary | ICD-10-CM | POA: Diagnosis not present

## 2016-05-31 DIAGNOSIS — K219 Gastro-esophageal reflux disease without esophagitis: Secondary | ICD-10-CM

## 2016-05-31 DIAGNOSIS — R569 Unspecified convulsions: Secondary | ICD-10-CM

## 2016-06-29 ENCOUNTER — Other Ambulatory Visit: Payer: Self-pay | Admitting: Neurology

## 2016-06-29 ENCOUNTER — Inpatient Hospital Stay: Payer: Medicare Other | Admitting: Physical Medicine & Rehabilitation

## 2016-06-29 DIAGNOSIS — S065X9A Traumatic subdural hemorrhage with loss of consciousness of unspecified duration, initial encounter: Secondary | ICD-10-CM

## 2016-06-29 DIAGNOSIS — S065XAA Traumatic subdural hemorrhage with loss of consciousness status unknown, initial encounter: Secondary | ICD-10-CM

## 2016-07-04 ENCOUNTER — Ambulatory Visit
Admission: RE | Admit: 2016-07-04 | Discharge: 2016-07-04 | Disposition: A | Discharge status: Home / Self Care | Payer: Medicare Other | Source: Ambulatory Visit | Attending: Neurology | Admitting: Neurology

## 2016-07-04 DIAGNOSIS — S065X9A Traumatic subdural hemorrhage with loss of consciousness of unspecified duration, initial encounter: Secondary | ICD-10-CM

## 2016-07-04 DIAGNOSIS — S065XAA Traumatic subdural hemorrhage with loss of consciousness status unknown, initial encounter: Secondary | ICD-10-CM

## 2016-07-04 DIAGNOSIS — I6200 Nontraumatic subdural hemorrhage, unspecified: Secondary | ICD-10-CM | POA: Insufficient documentation

## 2016-07-25 DIAGNOSIS — R41840 Attention and concentration deficit: Secondary | ICD-10-CM | POA: Diagnosis not present

## 2016-07-25 DIAGNOSIS — R278 Other lack of coordination: Secondary | ICD-10-CM | POA: Diagnosis not present

## 2016-07-25 DIAGNOSIS — Z9181 History of falling: Secondary | ICD-10-CM | POA: Diagnosis not present

## 2016-07-25 DIAGNOSIS — R2681 Unsteadiness on feet: Secondary | ICD-10-CM | POA: Diagnosis not present

## 2016-07-29 DIAGNOSIS — H53461 Homonymous bilateral field defects, right side: Secondary | ICD-10-CM | POA: Diagnosis not present

## 2016-08-26 DIAGNOSIS — D696 Thrombocytopenia, unspecified: Secondary | ICD-10-CM | POA: Diagnosis not present

## 2016-08-26 DIAGNOSIS — M1A09X0 Idiopathic chronic gout, multiple sites, without tophus (tophi): Secondary | ICD-10-CM | POA: Diagnosis not present

## 2016-08-26 DIAGNOSIS — E538 Deficiency of other specified B group vitamins: Secondary | ICD-10-CM | POA: Diagnosis not present

## 2016-08-26 DIAGNOSIS — I4891 Unspecified atrial fibrillation: Secondary | ICD-10-CM | POA: Diagnosis not present

## 2016-08-26 DIAGNOSIS — I10 Essential (primary) hypertension: Secondary | ICD-10-CM | POA: Diagnosis not present

## 2016-09-02 DIAGNOSIS — E538 Deficiency of other specified B group vitamins: Secondary | ICD-10-CM | POA: Diagnosis not present

## 2016-09-02 DIAGNOSIS — K219 Gastro-esophageal reflux disease without esophagitis: Secondary | ICD-10-CM | POA: Diagnosis not present

## 2016-09-02 DIAGNOSIS — Z8673 Personal history of transient ischemic attack (TIA), and cerebral infarction without residual deficits: Secondary | ICD-10-CM | POA: Diagnosis not present

## 2016-09-02 DIAGNOSIS — I10 Essential (primary) hypertension: Secondary | ICD-10-CM | POA: Diagnosis not present

## 2016-09-02 DIAGNOSIS — F3342 Major depressive disorder, recurrent, in full remission: Secondary | ICD-10-CM | POA: Diagnosis not present

## 2016-09-02 DIAGNOSIS — Z8679 Personal history of other diseases of the circulatory system: Secondary | ICD-10-CM | POA: Diagnosis not present

## 2016-09-02 DIAGNOSIS — E784 Other hyperlipidemia: Secondary | ICD-10-CM | POA: Diagnosis not present

## 2016-09-02 DIAGNOSIS — D696 Thrombocytopenia, unspecified: Secondary | ICD-10-CM | POA: Diagnosis not present

## 2016-09-02 DIAGNOSIS — I2510 Atherosclerotic heart disease of native coronary artery without angina pectoris: Secondary | ICD-10-CM | POA: Diagnosis not present

## 2016-09-02 DIAGNOSIS — R569 Unspecified convulsions: Secondary | ICD-10-CM | POA: Diagnosis not present

## 2016-09-02 DIAGNOSIS — M1A09X0 Idiopathic chronic gout, multiple sites, without tophus (tophi): Secondary | ICD-10-CM | POA: Diagnosis not present

## 2016-09-02 DIAGNOSIS — I481 Persistent atrial fibrillation: Secondary | ICD-10-CM | POA: Diagnosis not present

## 2016-11-09 DIAGNOSIS — I6200 Nontraumatic subdural hemorrhage, unspecified: Secondary | ICD-10-CM | POA: Diagnosis not present

## 2016-11-09 DIAGNOSIS — I69320 Aphasia following cerebral infarction: Secondary | ICD-10-CM | POA: Diagnosis not present

## 2016-11-09 DIAGNOSIS — G40209 Localization-related (focal) (partial) symptomatic epilepsy and epileptic syndromes with complex partial seizures, not intractable, without status epilepticus: Secondary | ICD-10-CM | POA: Diagnosis not present

## 2016-11-22 DIAGNOSIS — I481 Persistent atrial fibrillation: Secondary | ICD-10-CM | POA: Diagnosis not present

## 2016-11-22 DIAGNOSIS — I10 Essential (primary) hypertension: Secondary | ICD-10-CM | POA: Diagnosis not present

## 2016-11-29 DIAGNOSIS — E784 Other hyperlipidemia: Secondary | ICD-10-CM | POA: Diagnosis not present

## 2016-11-29 DIAGNOSIS — I6200 Nontraumatic subdural hemorrhage, unspecified: Secondary | ICD-10-CM | POA: Diagnosis not present

## 2016-11-29 DIAGNOSIS — K219 Gastro-esophageal reflux disease without esophagitis: Secondary | ICD-10-CM | POA: Diagnosis not present

## 2016-11-29 DIAGNOSIS — M1A09X0 Idiopathic chronic gout, multiple sites, without tophus (tophi): Secondary | ICD-10-CM | POA: Diagnosis not present

## 2016-11-29 DIAGNOSIS — D696 Thrombocytopenia, unspecified: Secondary | ICD-10-CM | POA: Diagnosis not present

## 2016-11-29 DIAGNOSIS — G40209 Localization-related (focal) (partial) symptomatic epilepsy and epileptic syndromes with complex partial seizures, not intractable, without status epilepticus: Secondary | ICD-10-CM | POA: Diagnosis not present

## 2016-11-29 DIAGNOSIS — I2510 Atherosclerotic heart disease of native coronary artery without angina pectoris: Secondary | ICD-10-CM | POA: Diagnosis not present

## 2016-11-29 DIAGNOSIS — Z8673 Personal history of transient ischemic attack (TIA), and cerebral infarction without residual deficits: Secondary | ICD-10-CM | POA: Diagnosis not present

## 2016-11-29 DIAGNOSIS — I10 Essential (primary) hypertension: Secondary | ICD-10-CM | POA: Diagnosis not present

## 2016-11-29 DIAGNOSIS — F3342 Major depressive disorder, recurrent, in full remission: Secondary | ICD-10-CM | POA: Diagnosis not present

## 2016-11-29 DIAGNOSIS — I481 Persistent atrial fibrillation: Secondary | ICD-10-CM | POA: Diagnosis not present

## 2016-11-29 DIAGNOSIS — R569 Unspecified convulsions: Secondary | ICD-10-CM | POA: Diagnosis not present

## 2017-06-07 DIAGNOSIS — Z125 Encounter for screening for malignant neoplasm of prostate: Secondary | ICD-10-CM | POA: Diagnosis not present

## 2017-06-07 DIAGNOSIS — I2510 Atherosclerotic heart disease of native coronary artery without angina pectoris: Secondary | ICD-10-CM | POA: Diagnosis not present

## 2017-06-07 DIAGNOSIS — I481 Persistent atrial fibrillation: Secondary | ICD-10-CM | POA: Diagnosis not present

## 2017-06-07 DIAGNOSIS — M1A09X0 Idiopathic chronic gout, multiple sites, without tophus (tophi): Secondary | ICD-10-CM | POA: Diagnosis not present

## 2017-06-07 DIAGNOSIS — E538 Deficiency of other specified B group vitamins: Secondary | ICD-10-CM | POA: Diagnosis not present

## 2017-06-07 DIAGNOSIS — I10 Essential (primary) hypertension: Secondary | ICD-10-CM | POA: Diagnosis not present

## 2017-06-14 DIAGNOSIS — F3342 Major depressive disorder, recurrent, in full remission: Secondary | ICD-10-CM | POA: Diagnosis not present

## 2017-06-14 DIAGNOSIS — I481 Persistent atrial fibrillation: Secondary | ICD-10-CM | POA: Diagnosis not present

## 2017-06-14 DIAGNOSIS — Z8673 Personal history of transient ischemic attack (TIA), and cerebral infarction without residual deficits: Secondary | ICD-10-CM | POA: Diagnosis not present

## 2017-06-14 DIAGNOSIS — D696 Thrombocytopenia, unspecified: Secondary | ICD-10-CM | POA: Diagnosis not present

## 2017-06-14 DIAGNOSIS — Z0000 Encounter for general adult medical examination without abnormal findings: Secondary | ICD-10-CM | POA: Diagnosis not present

## 2017-06-14 DIAGNOSIS — G40209 Localization-related (focal) (partial) symptomatic epilepsy and epileptic syndromes with complex partial seizures, not intractable, without status epilepticus: Secondary | ICD-10-CM | POA: Diagnosis not present

## 2017-06-14 DIAGNOSIS — Z8679 Personal history of other diseases of the circulatory system: Secondary | ICD-10-CM | POA: Diagnosis not present

## 2017-06-14 DIAGNOSIS — M1A09X0 Idiopathic chronic gout, multiple sites, without tophus (tophi): Secondary | ICD-10-CM | POA: Diagnosis not present

## 2017-06-14 DIAGNOSIS — I2510 Atherosclerotic heart disease of native coronary artery without angina pectoris: Secondary | ICD-10-CM | POA: Diagnosis not present

## 2017-06-14 DIAGNOSIS — I10 Essential (primary) hypertension: Secondary | ICD-10-CM | POA: Diagnosis not present

## 2017-06-14 DIAGNOSIS — E538 Deficiency of other specified B group vitamins: Secondary | ICD-10-CM | POA: Diagnosis not present

## 2017-06-14 DIAGNOSIS — E7849 Other hyperlipidemia: Secondary | ICD-10-CM | POA: Diagnosis not present

## 2017-06-14 DIAGNOSIS — K219 Gastro-esophageal reflux disease without esophagitis: Secondary | ICD-10-CM | POA: Diagnosis not present

## 2017-06-19 DIAGNOSIS — R29898 Other symptoms and signs involving the musculoskeletal system: Secondary | ICD-10-CM | POA: Diagnosis not present

## 2017-06-19 DIAGNOSIS — G40209 Localization-related (focal) (partial) symptomatic epilepsy and epileptic syndromes with complex partial seizures, not intractable, without status epilepticus: Secondary | ICD-10-CM | POA: Diagnosis not present

## 2017-06-19 DIAGNOSIS — I69320 Aphasia following cerebral infarction: Secondary | ICD-10-CM | POA: Diagnosis not present

## 2017-06-19 DIAGNOSIS — S065X9A Traumatic subdural hemorrhage with loss of consciousness of unspecified duration, initial encounter: Secondary | ICD-10-CM | POA: Diagnosis not present

## 2017-07-01 DIAGNOSIS — L03032 Cellulitis of left toe: Secondary | ICD-10-CM | POA: Diagnosis not present

## 2017-07-07 ENCOUNTER — Ambulatory Visit: Payer: PPO | Attending: Neurology | Admitting: Occupational Therapy

## 2017-07-07 ENCOUNTER — Encounter: Payer: Self-pay | Admitting: Occupational Therapy

## 2017-07-07 DIAGNOSIS — M6281 Muscle weakness (generalized): Secondary | ICD-10-CM | POA: Diagnosis not present

## 2017-07-07 DIAGNOSIS — R414 Neurologic neglect syndrome: Secondary | ICD-10-CM

## 2017-07-07 DIAGNOSIS — I609 Nontraumatic subarachnoid hemorrhage, unspecified: Secondary | ICD-10-CM

## 2017-07-07 DIAGNOSIS — S065X9A Traumatic subdural hemorrhage with loss of consciousness of unspecified duration, initial encounter: Secondary | ICD-10-CM

## 2017-07-07 DIAGNOSIS — R278 Other lack of coordination: Secondary | ICD-10-CM

## 2017-07-07 DIAGNOSIS — S065XAA Traumatic subdural hemorrhage with loss of consciousness status unknown, initial encounter: Secondary | ICD-10-CM

## 2017-07-10 NOTE — Therapy (Signed)
[place] MAIN [facility] SERVICES [address] [place], [place], [address] Phone: [phone]   Fax:  [phone]  Occupational Therapy Evaluation  Patient Details  Name: James [name] MRN: [id number] Date of Birth: [birth year] No Data Recorded  Encounter Date: 07/07/2017  OT End of Session - 07/10/17 0936    Visit Number  1    Number of Visits  24    Date for OT Re-Evaluation  09/29/17    Authorization Type  Medicare G code 1    OT Start Time  1000    OT Stop Time  1100    OT Time Calculation (min)  60 min    Activity Tolerance  Patient tolerated treatment well    Behavior During Therapy  [facility] for tasks assessed/performed       Past Medical History:  Diagnosis Date  . Alcohol abuse, in remission   . Atrial fibrillation ([place])   . Bilateral renal masses   . Closed right ankle fracture   . 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 Gait disorder   . Gout   . Hypertension   . Seizure disorder ([place])   . 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     Past Surgical History:  Procedure Laterality Date  . CRANIECTOMY FOR DEPRESSED SKULL FRACTURE  2009   with MRSA infection  . CRANIOTOMY     X 2  . HERNIA REPAIR     during childhood  . ORIF ANKLE FRACTURE Left 2012    There were no vitals filed for this visit.  Subjective Assessment - 07/10/17 0924    Subjective   Patient reports he is having more difficulty with using his right hand for daily tasks.     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  He suffered another fall in 2017.  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     Patient Stated Goals  to be able to use the right hand functionally    Currently in Pain?  No/denies    Multiple Pain  Sites  No        [facility] OT Assessment - 07/10/17 4008      Assessment   Medical Diagnosis  CVA/TBI    Referring Provider  Manuella Ghazi    Onset Date/Surgical Date  06/22/17 original fall 2005    Prior Therapy  OT, SLP, PT      Precautions   Precautions  Fall      Restrictions   Weight Bearing Restrictions  No      Balance Screen   Has the patient fallen in the past 6 months  Yes    How many times?  2    Has the patient had a decrease in activity level because of a fear of falling?   No    Is the patient reluctant to leave their home because of a fear of falling?   No      Home  Environment   Family/patient expects to be discharged to:  Private residence    Living Arrangements  Spouse/significant other    Available Help at Discharge  Family    Type of Home  Other (Comment)    Home Access  --  condo    Home Layout  One level    Medical sales representative  Handicapped height    Bathroom Accessibility  Yes    Home Equipment  Grab bars - tub/shower;Walker - 2 wheels;Walker - 4 wheels;Cane - single point;Shower seat    Additional Comments  does not need to use most of the adaptive equipment    Lives With  Spouse      Prior Function   Level of Independence  Needs assistance with homemaking    Vocation  Retired    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, museum in the past      ADL   Eating/Feeding  Needs assist with cutting food    Grooming  Minimal assistance    Upper Body Bathing  Modified independent    Lower Body Bathing  Modified independent    Upper Body Dressing  Increased time;Needs assist for fasteners    Lower Body Dressing  Modified independent    Toilet Transfer  Modified independent    Toileting - Clothing Manipulation  Modified independent    Tub/Shower Transfer  Modified independent    ADL comments  Patient has difficulty with cutting meat, holding utensils in right hand, shaving, getting keys or items out of his pocket, neglect with  holding items in right hand hand dropping.         IADL   Prior Level of Function Shopping  independent    Shopping  Assistance for transportation    Prior Level of Function Light Housekeeping  independent    Light Housekeeping  Performs light daily tasks such as dishwashing, bed making    Prior Level of Function Meal Prep  independent    Meal Prep  Able to complete simple cold meal and snack prep    Prior Level of Function Community Mobility  independent    Community Mobility  Relies on family or friends for transportation    Prior Level of Function Medication Managment  independent    Medication Management  Is responsible for taking medication in correct dosages at correct time    Prior Level of Function Financial Management  independent    Financial Management  Requires assistance      Mobility   Mobility Status  History of falls      Written Expression   Dominant Hand  Right      Vision - History   Baseline Vision  Wears glasses only for reading    Additional Comments  Patient demonstrates neglect on right side and decreased vision in right lower quadrant with examination.      Vision Assessment   Tracking/Visual Pursuits  Decreased smoothness of eye movement to Right inferior field    Convergence  Within functional limits      Cognition   Overall Cognitive Status  Cognition to be further assessed in functional context PRN    Attention  Focused    Focused Attention  Appears intact    Memory  Impaired    Memory Impairment  Decreased short term memory    Awareness  Appears intact    Cognition Comments  Patient has expressive aphasia, wife present to provide most details.  He appears to have intact receptive abilities and is able to follow commands consistently.        Sensation   Light Touch  Impaired Detail    Light Touch Impaired Details  Impaired RUE    Stereognosis  Impaired Detail  Proprioception  Impaired by gross assessment      Coordination   Gross Motor  Movements are Fluid and Coordinated  No    Fine Motor Movements are Fluid and Coordinated  No    Finger Nose Finger Test  impaired on right    9 Hole Peg Test  Right;Left    Right 9 Hole Peg Test  unable    Left 9 Hole Peg Test  34 secs      ROM / Strength   AROM / PROM / Strength  AROM;Strength      AROM   Overall AROM   Deficits    Overall AROM Comments  Patient's AROM in shoulder WFLs, limited ROM in hand especially at the right thumb at [facility] joint.        Strength   Overall Strength  Within functional limits for tasks performed;Deficits    Overall Strength Comments  Patient has good strength in shoulder but lacks strength in right hand and thumb, decreased proprioception and motor planning with movements      Hand Function   Right Hand Grip (lbs)  51    Right Hand Lateral Pinch  13 lbs    Right Hand 3 Point Pinch  14 lbs    Left Hand Grip (lbs)  55    Left Hand Lateral Pinch  19 lbs    Left 3 point pinch  16 lbs                      OT Education - 07/10/17 0936    Education provided  Yes    Education Details  role of OT, right hand use, goals    Person(s) Educated  Patient;Spouse    Methods  Explanation          OT Long Term Goals - 07/10/17 0946      OT LONG TERM GOAL #1   Title  Patient will improve right hand function to hold utensil in right hand for self feeding with modified independence.     Baseline  unable to hold utensil at eval    Time  12    Period  Weeks    Status  New    Target Date  09/29/17      OT LONG TERM GOAL #2   Title  Pt will increase hand strength in right by 5# to be able to cut food with modified independence.    Baseline  unable at eval     Time  12    Period  Weeks    Status  New    Target Date  09/29/17      OT LONG TERM GOAL #3   Title  Patient will demonstrate increased awareness and strategies to attend to right side to hold items without dropping items.    Baseline  drops items frequently    Time  12     Period  Weeks    Status  New    Target Date  09/29/17      OT LONG TERM GOAL #4   Title  Patient will demonstrate independence in home exercise program for ROM, strength and coordination of right hand.    Baseline  no current program    Time  6    Period  Weeks    Status  New    Target Date  08/18/17      OT LONG TERM GOAL #5   Title  Patient will demonstrate the  ability to obtain items from right pocket 90% of the time.    Baseline  difficulty getting keys or coins out of right pocket    Time  12    Period  Weeks    Status  New    Target Date  09/30/16            Plan - 07/10/17 3300    Clinical Impression Statement  Pt is a 76 yo male who suffered a fall with a TBI, subdural hemorrhage followed by a CVA in 2005 with some residual difficulties including neglect and aphasia.  He had a subsequent fall in 2017 and reports a recent decline in right hand function for daily tasks.  Patient presents with right sided neglect/visual impairment, decreased range of motion and strength in right hand, decreased fine motor coordination, and decreased motor planning which affects his ability to perform self care and IADL tasks at home and in the community.  He would benefit from skilled OT to maximize his safety and independence in daily tasks.     Occupational Profile and client history currently impacting functional performance  repeated falls, TBI, expressive aphasia, limited motion in right hand, lack of coordination from previous CVA    Occupational performance deficits (Please refer to evaluation for details):  ADL's;IADL's;Social Participation;Leisure    Rehab Potential  Good    Current Impairments/barriers affecting progress:  expressive aphasia, falls, neglect    OT Frequency  2x / week    OT Duration  12 weeks    OT Treatment/Interventions  Self-care/ADL training;Visual/perceptual remediation/compensation;DME and/or AE instruction;Patient/family education;Moist Heat;Therapeutic  exercise;Manual Therapy;Therapeutic activities;Neuromuscular education    Clinical Decision Making  Several treatment options, min-mod task modification necessary    Consulted and Agree with Plan of Care  Patient;Family member/caregiver    Family Member Consulted  wife, [name]       Patient will benefit from skilled therapeutic intervention in order to improve the following deficits and impairments:  Impaired vision/preception, Decreased coordination, Decreased safety awareness, Impaired UE functional use, Decreased strength  Visit Diagnosis: SAH (subarachnoid hemorrhage) ([place])  Other lack of coordination  Muscle weakness (generalized)  SDH (subdural hematoma) ([facility])  Neurologic neglect syndrome    Problem List Patient Active Problem List   Diagnosis Date Noted  . Sleep disturbance   . PAF (paroxysmal atrial fibrillation) ([place])   . Recurrent UTI   . Hypokalemia   . Hyponatremia   . Abnormal urine   . 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 Benign essential HTN   . Urinary retention   . History of gout   . Global aphasia   . SAH (subarachnoid hemorrhage) ([place])   . SDH (subdural hematoma) ([place])   . Lymphocytosis   . Subdural hemorrhage following injury ([place]) 05/12/2016   Amy T Lovett, OTR/L, CLT  Lovett,Amy 07/10/2017, 9:55 AM  [place] MAIN [facility] SERVICES [place], [place], [address] Phone: [phone]   Fax:  [phone]  Name: James [name] MRN: [id number] Date of Birth: Jan 09, 1941

## 2017-07-11 ENCOUNTER — Ambulatory Visit: Payer: PPO | Admitting: Occupational Therapy

## 2017-07-11 ENCOUNTER — Encounter: Payer: Self-pay | Admitting: Occupational Therapy

## 2017-07-11 DIAGNOSIS — R278 Other lack of coordination: Secondary | ICD-10-CM

## 2017-07-11 DIAGNOSIS — M6281 Muscle weakness (generalized): Secondary | ICD-10-CM

## 2017-07-11 NOTE — Therapy (Signed)
[place] MAIN [facility] SERVICES [address] [place], [place], [address] Phone: [phone]   Fax:  [phone]  Occupational Therapy Treatment  Patient Details  Name: James [name] MRN: [id number] Date of Birth: [birth year] No Data Recorded  Encounter Date: 07/11/2017  OT End of Session - 07/11/17 2246    Visit Number  2    Number of Visits  24    Date for OT Re-Evaluation  09/29/17    Authorization Type  Medicare G code 2    OT Start Time  1435    OT Stop Time  1515    OT Time Calculation (min)  40 min    Activity Tolerance  Patient tolerated treatment well    Behavior During Therapy  [facility] for tasks assessed/performed       Past Medical History:  Diagnosis Date  . Alcohol abuse, in remission   . Atrial fibrillation ([place])   . Bilateral renal masses   . Closed right ankle fracture   . 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 Gait disorder   . Gout   . Hypertension   . Seizure disorder ([place])   . 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     Past Surgical History:  Procedure Laterality Date  . CRANIECTOMY FOR DEPRESSED SKULL FRACTURE  2009   with MRSA infection  . CRANIOTOMY     X 2  . HERNIA REPAIR     during childhood  . ORIF ANKLE FRACTURE Left 2012    There were no vitals filed for this visit.  Subjective Assessment - 07/11/17 2239    Subjective   Pt. is very pleasant during session. Pt. presents with expressive impairments.    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  He suffered another fall in 2017.  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     Patient Stated Goals  to be able to use the right hand functionally    Currently in Pain?  No/denies       OT TREATMENT     Neuro muscular re-education:  Pt. worked on grasping, and stacking [place] style discs. Pt. Dropped multiple discs and hand difficulty grasping them from a stacked position. Pt. Requires frequent verbal, and visual cues for hand position. Pt. Had more success following each visual/verbal cue.   Therapeutic Exercise:  Pt. performed gross gripping with grip strengthener. Pt. worked on sustaining grip while grasping pegs and reaching at various heights. The Gripper was placed in the 2nd resistive slot with the white resistive spring. Pt. Dropped several pegs. Pt. Worked on pinch strengthening in the right hand for lateral, and 3pt. pinch using yellow, red, and green resistive clips. Pt. worked on placing the clips at various vertical and horizontal angles. Constant  Tactile and verbal cues were required for eliciting the desired movement. Pt. Initially had difficulty grasping the the clips using lateral, and 3pt pinch. Pt. Was able to perform the task with step-by-step instruction.                              OT Education - 07/11/17 2245    Education provided  Yes  Education Details  Reviewed goals with pt. RUE functioning.    Person(s) Educated  Patient    Methods  Explanation          OT Long Term Goals - 07/10/17 0946      OT LONG TERM GOAL #1   Title  Patient will improve right hand function to hold utensil in right hand for self feeding with modified independence.     Baseline  unable to hold utensil at eval    Time  12    Period  Weeks    Status  New    Target Date  09/29/17      OT LONG TERM GOAL #2   Title  Pt will increase hand strength in right by 5# to be able to cut food with modified independence.    Baseline  unable at eval     Time  12    Period  Weeks    Status  New    Target Date  09/29/17      OT LONG TERM GOAL #3   Title  Patient will demonstrate increased awareness and strategies to attend to right side to hold items without  dropping items.    Baseline  drops items frequently    Time  12    Period  Weeks    Status  New    Target Date  09/29/17      OT LONG TERM GOAL #4   Title  Patient will demonstrate independence in home exercise program for ROM, strength and coordination of right hand.    Baseline  no current program    Time  6    Period  Weeks    Status  New    Target Date  08/18/17      OT LONG TERM GOAL #5   Title  Patient will demonstrate the ability to obtain items from right pocket 90% of the time.    Baseline  difficulty getting keys or coins out of right pocket    Time  12    Period  Weeks    Status  New    Target Date  09/30/16            Plan - 07/11/17 2247    Clinical Impression Statement  Pt. reports that he trying to use his right hand more during tasks at home. Pt. continues to present with limited hand function, and [facility] skills. Pt. has difficulty grasping objects. Pt. requires visual demonstration, and tactile cues for hand position, as well as step by step instruction.. Pt. continues to work on improving UE strength, and [facility] skills for improved engagement in ADL, and IADL functioning.    Occupational Profile and client history currently impacting functional performance  repeated falls, TBI, expressive aphasia, limited motion in right hand, lack of coordination from previous CVA    Occupational performance deficits (Please refer to evaluation for details):  ADL's;IADL's;Social Participation;Leisure    Rehab Potential  Good    Current Impairments/barriers affecting progress:  expressive aphasia, falls, neglect    OT Frequency  2x / week    OT Duration  12 weeks    OT Treatment/Interventions  Self-care/ADL training;Visual/perceptual remediation/compensation;DME and/or AE instruction;Patient/family education;Moist Heat;Therapeutic exercise;Manual Therapy;Therapeutic activities;Neuromuscular education    Clinical Decision Making  Several treatment options, min-mod task modification  necessary    Consulted and Agree with Plan of Care  Patient;Family member/caregiver       Patient will benefit from skilled therapeutic intervention in order  to improve the following deficits and impairments:  Impaired vision/preception, Decreased coordination, Decreased safety awareness, Impaired UE functional use, Decreased strength  Visit Diagnosis: Muscle weakness (generalized)  Other lack of coordination    Problem List Patient Active Problem List   Diagnosis Date Noted  . Sleep disturbance   . PAF (paroxysmal atrial fibrillation) ([place])   . Recurrent UTI   . Hypokalemia   . Hyponatremia   . Abnormal urine   . 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 Benign essential HTN   . Urinary retention   . History of gout   . Global aphasia   . SAH (subarachnoid hemorrhage) ([place])   . SDH (subdural hematoma) ([place])   . Lymphocytosis   . Subdural hemorrhage following injury ([place]) 05/12/2016    Harrel Carina, MS, OTR/L 07/11/2017, 11:01 PM  [place] MAIN [facility] SERVICES [address] [place], [place], [address] Phone: [phone]   Fax:  [phone]  Name: James [name] MRN: [id number] Date of Birth: August 15, 1940

## 2017-07-13 ENCOUNTER — Ambulatory Visit: Payer: PPO | Admitting: Occupational Therapy

## 2017-07-13 ENCOUNTER — Encounter: Payer: Self-pay | Admitting: Occupational Therapy

## 2017-07-13 DIAGNOSIS — R278 Other lack of coordination: Secondary | ICD-10-CM

## 2017-07-13 DIAGNOSIS — M6281 Muscle weakness (generalized): Secondary | ICD-10-CM | POA: Diagnosis not present

## 2017-07-13 NOTE — Therapy (Signed)
[place] MAIN [facility] SERVICES [address] [place], [place], [address] Phone: [phone]   Fax:  [phone]  Occupational Therapy Treatment  Patient Details  Name: James [name] MRN: [id number] Date of Birth: 1941-04-12 No Data Recorded  Encounter Date: 07/13/2017  OT End of Session - 07/13/17 1208    Visit Number  3    Number of Visits  24    Date for OT Re-Evaluation  09/29/17    Authorization Type  Medicare G code 3    OT Start Time  6962    OT Stop Time  1200    OT Time Calculation (min)  44 min    Activity Tolerance  Patient tolerated treatment well    Behavior During Therapy  [facility] for tasks assessed/performed       Past Medical History:  Diagnosis Date  . Alcohol abuse, in remission   . Atrial fibrillation ([place])   . Bilateral renal masses   . Closed right ankle fracture   . 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 Gait disorder   . Gout   . Hypertension   . Seizure disorder ([place])   . 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     Past Surgical History:  Procedure Laterality Date  . CRANIECTOMY FOR DEPRESSED SKULL FRACTURE  2009   with MRSA infection  . CRANIOTOMY     X 2  . HERNIA REPAIR     during childhood  . ORIF ANKLE FRACTURE Left 2012    There were no vitals filed for this visit.  Subjective Assessment - 07/13/17 1205    Subjective   Encouraged pt.'s wife to have pt. engage his right hand in daily tasks at home.     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  He suffered another fall in 2017.  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     Currently in Pain?  No/denies      OT TREATMENT    Neuro muscular re-education:  Pt. Worked on grasping 1" resistive cubes  with the right hand, and placing them back in place. Pt. worked on Cabin crew style discs. Pt. required visual demonstration, verbal, and visual cues for movement patterns.  Pt. Worked on alternating weightbearing. Pt. Worked on opening bottles with cues, and assist to stabilize the base with the left hand, and screw, and unscrew the tops with his right hand. Assist needed to stabilize the base.  Therapeutic Exercise:  Pt. performed gross gripping with grip strengthener. Pt. worked on sustaining grip while grasping pegs and reaching at various heights. Gripper was placed in the 2nd resistive slot with the white resistive spring. Pt. Worked on pinch strengthening in the right hand for lateral, and 3pt. pinch using yellow, red resistive clips. Pt. worked on placing the clips at various vertical and horizontal angles. Tactile and verbal cues, and visual demonstration were required for eliciting the desired movement.                           OT Education - 07/13/17 1206    Education provided  Yes    Education Details  RUE motor control, and  coordination    Person(s) Educated  Patient    Methods  Explanation    Comprehension  Verbalized understanding          OT Long Term Goals - 07/10/17 0946      OT LONG TERM GOAL #1   Title  Patient will improve right hand function to hold utensil in right hand for self feeding with modified independence.     Baseline  unable to hold utensil at eval    Time  12    Period  Weeks    Status  New    Target Date  09/29/17      OT LONG TERM GOAL #2   Title  Pt will increase hand strength in right by 5# to be able to cut food with modified independence.    Baseline  unable at eval     Time  12    Period  Weeks    Status  New    Target Date  09/29/17      OT LONG TERM GOAL #3   Title  Patient will demonstrate increased awareness and strategies to attend to right side to hold items without dropping items.     Baseline  drops items frequently    Time  12    Period  Weeks    Status  New    Target Date  09/29/17      OT LONG TERM GOAL #4   Title  Patient will demonstrate independence in home exercise program for ROM, strength and coordination of right hand.    Baseline  no current program    Time  6    Period  Weeks    Status  New    Target Date  08/18/17      OT LONG TERM GOAL #5   Title  Patient will demonstrate the ability to obtain items from right pocket 90% of the time.    Baseline  difficulty getting keys or coins out of right pocket    Time  12    Period  Weeks    Status  New    Target Date  09/30/16            Plan - 07/13/17 1208    Clinical Impression Statement  Pt. attempts to initiate tasks with his left hand. Pt. requires verbal cues, visual demonstration, tactlie cues and constant reinforcement for the movement patterns, and hand position. Pt. continues to work on improving hand strength, and coordination skills.    Occupational Profile and client history currently impacting functional performance  repeated falls, TBI, expressive aphasia, limited motion in right hand, lack of coordination from previous CVA    Occupational performance deficits (Please refer to evaluation for details):  ADL's;Social Participation;Leisure    Rehab Potential  Good    OT Frequency  2x / week    OT Duration  12 weeks    OT Treatment/Interventions  Self-care/ADL training;Visual/perceptual remediation/compensation;DME and/or AE instruction;Patient/family education;Moist Heat;Therapeutic exercise;Manual Therapy;Therapeutic activities;Neuromuscular education    Clinical Decision Making  Several treatment options, min-mod task modification necessary    Consulted and Agree with Plan of Care  Patient;Family member/caregiver       Patient will benefit from skilled therapeutic intervention in order to improve the following deficits and impairments:  Impaired vision/preception, Decreased  coordination, Decreased safety awareness, Impaired UE functional use, Decreased strength  Visit Diagnosis: Muscle weakness (generalized)  Other lack of coordination    Problem List Patient Active Problem List  Diagnosis Date Noted  . Sleep disturbance   . PAF (paroxysmal atrial fibrillation) ([place])   . Recurrent UTI   . Hypokalemia   . Hyponatremia   . Abnormal urine   . 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 Benign essential HTN   . Urinary retention   . History of gout   . Global aphasia   . SAH (subarachnoid hemorrhage) ([place])   . SDH (subdural hematoma) ([place])   . Lymphocytosis   . Subdural hemorrhage following injury ([place]) 05/12/2016    Harrel Carina, MS, OTR/L 07/13/2017, 12:14 PM  [place] MAIN [facility] SERVICES [address] [place], [place], [address] Phone: [phone]   Fax:  [phone]  Name: James [name] MRN: [id number] Date of Birth: [birth year]

## 2017-07-17 ENCOUNTER — Encounter: Payer: Self-pay | Admitting: Occupational Therapy

## 2017-07-20 ENCOUNTER — Encounter: Payer: Self-pay | Admitting: Occupational Therapy

## 2017-07-20 ENCOUNTER — Ambulatory Visit: Payer: PPO | Admitting: Occupational Therapy

## 2017-07-20 DIAGNOSIS — M6281 Muscle weakness (generalized): Secondary | ICD-10-CM

## 2017-07-20 DIAGNOSIS — R278 Other lack of coordination: Secondary | ICD-10-CM

## 2017-07-20 DIAGNOSIS — I609 Nontraumatic subarachnoid hemorrhage, unspecified: Secondary | ICD-10-CM

## 2017-07-24 NOTE — Therapy (Signed)
[place] MAIN [facility] SERVICES [address] [place], [place], [address] Phone: [phone]   Fax:  [phone]  Occupational Therapy Treatment  Patient Details  Name: James [name] MRN: [id number] Date of Birth: [birth year] No Data Recorded  Encounter Date: 07/20/2017  OT End of Session - 07/24/17 6962    Visit Number  4    Number of Visits  24    Date for OT Re-Evaluation  09/29/17    Authorization Type  Medicare G code 4    OT Start Time  1045    OT Stop Time  1130    OT Time Calculation (min)  45 min    Activity Tolerance  Patient tolerated treatment well    Behavior During Therapy  [name] for tasks assessed/performed       Past Medical History:  Diagnosis Date  . Alcohol abuse, in remission   . Atrial fibrillation ([place])   . Bilateral renal masses   . Closed right ankle fracture   . 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 Gait disorder   . Gout   . Hypertension   . Seizure disorder ([place])   . 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     Past Surgical History:  Procedure Laterality Date  . CRANIECTOMY FOR DEPRESSED SKULL FRACTURE  2009   with MRSA infection  . CRANIOTOMY     X 2  . HERNIA REPAIR     during childhood  . ORIF ANKLE FRACTURE Left 2012    There were no vitals filed for this visit.  Subjective Assessment - 07/24/17 1612    Subjective   Discussed with patient and wife the difficulties patient has with graded pressure during daily tasks and the need to work on this skill at home.     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  He suffered another fall in 2017.  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     Patient Stated Goals  to be able to use the right hand functionally    Currently in Pain?  No/denies    Multiple Pain Sites  No                   OT Treatments/Exercises (OP) - 07/24/17 0001      Fine Motor Coordination   Other Fine Motor Exercises  Patient seen for right hand manipulation skills with turning and flipping of [place] discs with verbal cues for technique, patient demonstrates difficulty with turning and using middle finger and often needs therapist to demonstrate task and patient able to attempt to replicate.  Patient also see for focus on Graded pressure tasks using a slotted cup with therapist cues for light pressure, functional C curve grasping pattern, release and multiple trials with consistency.  Advanced to performing with a cone of hard plastic and having to switch between the 2 types of surfaces and pressures with light and normal.       Neurological Re-education Exercises   Other Exercises 1  Patient seen for right UE strengthening tasks with Red putty 2 containers for scooping, gripping, pinch skills for lateral, 2 point and 3 point for multiple repetitions and several trials.             OT Education -  07/24/17 1613    Education provided  Yes    Education Details  graded pressure tasks at home    Person(s) Educated  Patient;Spouse    Methods  Explanation;Demonstration;Verbal cues    Comprehension  Verbal cues required;Returned demonstration;Verbalized understanding          OT Long Term Goals - 07/10/17 0946      OT LONG TERM GOAL #1   Title  Patient will improve right hand function to hold utensil in right hand for self feeding with modified independence.     Baseline  unable to hold utensil at eval    Time  12    Period  Weeks    Status  New    Target Date  09/29/17      OT LONG TERM GOAL #2   Title  Pt will increase hand strength in right by 5# to be able to cut food with modified independence.    Baseline  unable at eval     Time  12    Period  Weeks    Status  New    Target Date  09/29/17      OT LONG TERM GOAL #3   Title   Patient will demonstrate increased awareness and strategies to attend to right side to hold items without dropping items.    Baseline  drops items frequently    Time  12    Period  Weeks    Status  New    Target Date  09/29/17      OT LONG TERM GOAL #4   Title  Patient will demonstrate independence in home exercise program for ROM, strength and coordination of right hand.    Baseline  no current program    Time  6    Period  Weeks    Status  New    Target Date  08/18/17      OT LONG TERM GOAL #5   Title  Patient will demonstrate the ability to obtain items from right pocket 90% of the time.    Baseline  difficulty getting keys or coins out of right pocket    Time  12    Period  Weeks    Status  New    Target Date  09/30/16            Plan - 07/24/17 1613    Clinical Impression Statement  Patient demonstrates difficulty with 3 point pinch skills with finger positioning and requires therapist assist and cues. Patient also has the most trouble with his right hand with tasks involving graded pressure from light to normal.  Patient seen this date with switching from 2 types of materials and having to determine which type of grasp to use.  Verbal cues provided and therapist demo for all tasks.  Patient will need continued focus on this area.  Wife instructed on exercises to assist at home.     Occupational Profile and client history currently impacting functional performance  repeated falls, TBI, expressive aphasia, limited motion in right hand, lack of coordination from previous CVA    Occupational performance deficits (Please refer to evaluation for details):  ADL's;Social Participation;Leisure    Rehab Potential  Good    Current Impairments/barriers affecting progress:  expressive aphasia, falls, neglect    OT Frequency  2x / week    OT Duration  12 weeks    OT Treatment/Interventions  Self-care/ADL training;Visual/perceptual remediation/compensation;DME and/or AE  instruction;Patient/family education;Moist Heat;Therapeutic exercise;Manual Therapy;Therapeutic activities;Neuromuscular education  Consulted and Agree with Plan of Care  Patient;Family member/caregiver    Family Member Consulted  wife, [name]       Patient will benefit from skilled therapeutic intervention in order to improve the following deficits and impairments:  Impaired vision/preception, Decreased coordination, Decreased safety awareness, Impaired UE functional use, Decreased strength  Visit Diagnosis: Muscle weakness (generalized)  Other lack of coordination  SAH (subarachnoid hemorrhage) ([place])    Problem List Patient Active Problem List   Diagnosis Date Noted  . Sleep disturbance   . PAF (paroxysmal atrial fibrillation) ([place])   . Recurrent UTI   . Hypokalemia   . Hyponatremia   . Abnormal urine   . 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 Benign essential HTN   . Urinary retention   . History of gout   . Global aphasia   . SAH (subarachnoid hemorrhage) ([place])   . SDH (subdural hematoma) ([place])   . Lymphocytosis   . Subdural hemorrhage following injury [facility]) 05/12/2016   Amy T Lovett, OTR/L, CLT  Lovett,Amy 07/24/2017, 4:26 PM  [place] MAIN [facility] SERVICES [address] [place], [place], [address] Phone: [phone]   Fax:  [phone]  Name: James [name] MRN: [id number] Date of Birth: 1940-12-18

## 2017-07-27 ENCOUNTER — Encounter: Payer: Self-pay | Admitting: Occupational Therapy

## 2017-07-27 ENCOUNTER — Ambulatory Visit: Payer: PPO | Attending: Neurology | Admitting: Occupational Therapy

## 2017-07-27 DIAGNOSIS — R414 Neurologic neglect syndrome: Secondary | ICD-10-CM | POA: Diagnosis not present

## 2017-07-27 DIAGNOSIS — I609 Nontraumatic subarachnoid hemorrhage, unspecified: Secondary | ICD-10-CM | POA: Diagnosis not present

## 2017-07-27 DIAGNOSIS — R278 Other lack of coordination: Secondary | ICD-10-CM | POA: Diagnosis not present

## 2017-07-27 DIAGNOSIS — M6281 Muscle weakness (generalized): Secondary | ICD-10-CM | POA: Insufficient documentation

## 2017-07-29 NOTE — Therapy (Signed)
[place] MAIN [facility] SERVICES [address] [place], [place], [address] Phone: [phone]   Fax:  [phone]  Occupational Therapy Treatment  Patient Details  Name: James [name] MRN: [id number] Date of Birth: Feb 17, 1941 No Data Recorded  Encounter Date: 07/27/2017  OT End of Session - 07/29/17 1912    Visit Number  5    Number of Visits  24    Date for OT Re-Evaluation  09/29/17    OT Start Time  1115    OT Stop Time  1200    OT Time Calculation (min)  45 min    Activity Tolerance  Patient tolerated treatment well    Behavior During Therapy  [facility] for tasks assessed/performed       Past Medical History:  Diagnosis Date  . Alcohol abuse, in remission   . Atrial fibrillation ([place])   . Bilateral renal masses   . Closed right ankle fracture   . 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 Gait disorder   . Gout   . Hypertension   . Seizure disorder ([place])   . 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     Past Surgical History:  Procedure Laterality Date  . CRANIECTOMY FOR DEPRESSED SKULL FRACTURE  2009   with MRSA infection  . CRANIOTOMY     X 2  . HERNIA REPAIR     during childhood  . ORIF ANKLE FRACTURE Left 2012    There were no vitals filed for this visit.                OT Treatments/Exercises (OP) - 07/29/17 1913      Fine Motor Coordination   Other Fine Motor Exercises  Patient seen for right hand manipulation skills with turning and flipping of [place] discs with verbal cues for technique, patient demonstrates difficulty with turning and using middle finger and often needs therapist to demonstrate task and patient able to attempt to replicate.  Patient also see for focus on Graded pressure tasks using a slotted cup with therapist cues for light pressure, functional C curve grasping pattern,  release and multiple trials with consistency.  Advanced to performing with a cone of hard plastic and having to switch between the 2 types of surfaces and pressures with light and normal.       Neurological Re-education Exercises   Other Exercises 1  Added increased distractions this date to increase the complexity of the tasks and patient did well, he require cues at times to attend visually to right hand and once he did, he was able to adjust his grip on the object.  Variety of objects used with varying weights, utilized a 2# weight, hammer, slotted Styrofoam cup, regular Styrofoam cup and round cotton pads. Patient was able to demonstrate ability to adjust pressure in right hand according to type of material used             OT Education - 07/29/17 1914    Education provided  Yes    Education Details  HEP with graded pressure    Person(s) Educated  Patient;Spouse    Methods  Explanation;Demonstration;Verbal cues    Comprehension  Verbal cues required;Returned demonstration;Verbalized understanding          OT Long Term Goals - 07/10/17 0946      OT LONG TERM GOAL #1  Title  Patient will improve right hand function to hold utensil in right hand for self feeding with modified independence.     Baseline  unable to hold utensil at eval    Time  12    Period  Weeks    Status  New    Target Date  09/29/17      OT LONG TERM GOAL #2   Title  Pt will increase hand strength in right by 5# to be able to cut food with modified independence.    Baseline  unable at eval     Time  12    Period  Weeks    Status  New    Target Date  09/29/17      OT LONG TERM GOAL #3   Title  Patient will demonstrate increased awareness and strategies to attend to right side to hold items without dropping items.    Baseline  drops items frequently    Time  12    Period  Weeks    Status  New    Target Date  09/29/17      OT LONG TERM GOAL #4   Title  Patient will demonstrate independence in home  exercise program for ROM, strength and coordination of right hand.    Baseline  no current program    Time  6    Period  Weeks    Status  New    Target Date  08/18/17      OT LONG TERM GOAL #5   Title  Patient will demonstrate the ability to obtain items from right pocket 90% of the time.    Baseline  difficulty getting keys or coins out of right pocket    Time  12    Period  Weeks    Status  New    Target Date  09/30/16            Plan - 07/29/17 1912    Clinical Impression Statement  Patient continues to make good progress with right UE in therapy.  Wife reports patient ate linguine last night and did a great job with managing the utensil and swirling the noodle onto the fork, she was impressed and could see a difference in his hand function.  When patient is focused on his right hand and patterns of grip, he does well with task and does not drop or "crush" items however, at times when distracted he often puts too much pressure on the objects in his hand or he tends to forget and drop the item.  Added increased distractions this date to increase the complexity of the tasks and patient did well, he require cues at times to attend visually to right hand and once he did, he was able to adjust his grip on the object.  Variety of objects used with varying weights, utilized a 2# weight, hammer, slotted Styrofoam cup, regular Styrofoam cup and round cotton pads. Patient was able to demonstrate ability to adjust pressure in right hand according to type of material used. Wife and patient educated on continued tasks to work on at home.      Occupational Profile and client history currently impacting functional performance  repeated falls, TBI, expressive aphasia, limited motion in right hand, lack of coordination from previous CVA    Occupational performance deficits (Please refer to evaluation for details):  ADL's;Social Participation;Leisure    Rehab Potential  Good    Current  Impairments/barriers affecting progress:  expressive aphasia, falls,  neglect    OT Frequency  2x / week    OT Duration  12 weeks    OT Treatment/Interventions  Self-care/ADL training;Visual/perceptual remediation/compensation;DME and/or AE instruction;Patient/family education;Moist Heat;Therapeutic exercise;Manual Therapy;Therapeutic activities;Neuromuscular education    Consulted and Agree with Plan of Care  Patient;Family member/caregiver    Family Member Consulted  wife, [name]       Patient will benefit from skilled therapeutic intervention in order to improve the following deficits and impairments:  Impaired vision/preception, Decreased coordination, Decreased safety awareness, Impaired UE functional use, Decreased strength  Visit Diagnosis: Muscle weakness (generalized)  Other lack of coordination  SAH (subarachnoid hemorrhage) ([place])    Problem List Patient Active Problem List   Diagnosis Date Noted  . Sleep disturbance   . PAF (paroxysmal atrial fibrillation) ([place])   . Recurrent UTI   . Hypokalemia   . Hyponatremia   . Abnormal urine   . 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 Benign essential HTN   . Urinary retention   . History of gout   . Global aphasia   . SAH (subarachnoid hemorrhage) ([place])   . SDH (subdural hematoma) ([place])   . Lymphocytosis   . Subdural hemorrhage following injury ([place]) 05/12/2016   Damarius Karnes T Leone Putman, OTR/L, CLT  Edilia Ghuman 07/29/2017, 7:15 PM  [place] MAIN [facility] SERVICES [address] [place], [place], [address] Phone: [phone]   Fax:  [phone]  Name: Vinayak Bobier MRN: [id number] Date of Birth: 03/09/1941

## 2017-08-01 ENCOUNTER — Ambulatory Visit: Payer: PPO | Admitting: Occupational Therapy

## 2017-08-01 DIAGNOSIS — R414 Neurologic neglect syndrome: Secondary | ICD-10-CM

## 2017-08-01 DIAGNOSIS — M6281 Muscle weakness (generalized): Secondary | ICD-10-CM

## 2017-08-01 DIAGNOSIS — R278 Other lack of coordination: Secondary | ICD-10-CM

## 2017-08-04 ENCOUNTER — Ambulatory Visit: Payer: PPO | Admitting: Occupational Therapy

## 2017-08-04 DIAGNOSIS — M6281 Muscle weakness (generalized): Secondary | ICD-10-CM | POA: Diagnosis not present

## 2017-08-04 DIAGNOSIS — R278 Other lack of coordination: Secondary | ICD-10-CM

## 2017-08-04 DIAGNOSIS — R414 Neurologic neglect syndrome: Secondary | ICD-10-CM

## 2017-08-08 ENCOUNTER — Ambulatory Visit: Payer: PPO | Admitting: Occupational Therapy

## 2017-08-08 DIAGNOSIS — R414 Neurologic neglect syndrome: Secondary | ICD-10-CM

## 2017-08-08 DIAGNOSIS — M6281 Muscle weakness (generalized): Secondary | ICD-10-CM

## 2017-08-08 DIAGNOSIS — R278 Other lack of coordination: Secondary | ICD-10-CM

## 2017-08-11 ENCOUNTER — Ambulatory Visit: Payer: PPO | Admitting: Occupational Therapy

## 2017-08-11 DIAGNOSIS — M6281 Muscle weakness (generalized): Secondary | ICD-10-CM

## 2017-08-11 DIAGNOSIS — R414 Neurologic neglect syndrome: Secondary | ICD-10-CM

## 2017-08-11 DIAGNOSIS — R278 Other lack of coordination: Secondary | ICD-10-CM

## 2017-08-12 ENCOUNTER — Encounter: Payer: Self-pay | Admitting: Occupational Therapy

## 2017-08-12 NOTE — Therapy (Signed)
[place] MAIN [facility] SERVICES [address] [place], [place], [address] Phone: [phone]   Fax:  [phone]  Occupational Therapy Treatment  Patient Details  Name: James [name] MRN: [id number] Date of Birth: [birth year] Referring Provider: Manuella Ghazi   Encounter Date: 08/11/2017  OT End of Session - 08/12/17 1918    Visit Number  9    Number of Visits  24    Date for OT Re-Evaluation  09/29/17    OT Start Time  1015    OT Stop Time  1100    OT Time Calculation (min)  45 min    Activity Tolerance  Patient tolerated treatment well    Behavior During Therapy  [facility] for tasks assessed/performed       Past Medical History:  Diagnosis Date  . Alcohol abuse, in remission   . Atrial fibrillation ([place])   . Bilateral renal masses   . Closed right ankle fracture   . 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 Gait disorder   . Gout   . Hypertension   . Seizure disorder ([place])   . 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     Past Surgical History:  Procedure Laterality Date  . CRANIECTOMY FOR DEPRESSED SKULL FRACTURE  2009   with MRSA infection  . CRANIOTOMY     X 2  . HERNIA REPAIR     during childhood  . ORIF ANKLE FRACTURE Left 2012    There were no vitals filed for this visit.  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  He suffered another fall in 2017.  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     Patient Stated Goals  to be able to use the right hand functionally    Currently in Pain?  No/denies    Multiple Pain Sites  No                   OT Treatments/Exercises (OP) - 08/12/17 1709      ADLs   ADL Comments  Patient seen with focus on handwriting tasks with cues for pen grip on  larger grip pen, cues for finger placement and tripod grasp.  Patient focusing on writing name, first name with cues for formation of letters, readjustment of pen grip, writing in print and script.  Greater legibilty with printing of letters.        Fine Motor Coordination (Hand/Wrist)   Other Fine Motor Exercises  Patient seen for focus on manipulation of glass beads from tabletop with some beads turned up and some turned flat side down.  Cues for prehension patterns on right to pick up and move items from tabletop to container.  Wife instructed on beads and she reports having some at home.              OT Education - 08/12/17 1917    Education provided  Yes    Education Details  use of clothespins to pinch and alternating with resistance and light touch activities.     Person(s) Educated  Patient    Methods  Explanation;Demonstration;Verbal cues    Comprehension  Verbal cues required;Returned demonstration;Verbalized understanding          OT Long  Term Goals - 08/12/17 1716      OT LONG TERM GOAL #1   Title  Patient will improve right hand function to hold utensil in right hand for self feeding with modified independence.     Baseline  unable to hold utensil at eval    Time  12    Period  Weeks    Status  On-going      OT LONG TERM GOAL #2   Title  Pt will increase hand strength in right by 5# to be able to cut food with modified independence.    Baseline  unable at eval     Time  12    Period  Weeks    Status  On-going      OT LONG TERM GOAL #3   Title  Patient will demonstrate increased awareness and strategies to attend to right side to hold items without dropping items.    Baseline  drops items frequently    Time  12    Period  Weeks    Status  On-going      OT LONG TERM GOAL #4   Title  Patient will demonstrate independence in home exercise program for ROM, strength and coordination of right hand.    Baseline  no current program    Time  6    Period  Weeks     Status  On-going      OT LONG TERM GOAL #5   Title  Patient will demonstrate the ability to obtain items from right pocket 90% of the time.    Baseline  difficulty getting keys or coins out of right pocket    Time  12    Period  Weeks    Status  On-going            Plan - 08/12/17 1918    Occupational Profile and client history currently impacting functional performance  repeated falls, TBI, expressive aphasia, limited motion in right hand, lack of coordination from previous CVA    Occupational performance deficits (Please refer to evaluation for details):  ADL's;Social Participation;Leisure    Rehab Potential  Good    Current Impairments/barriers affecting progress:  expressive aphasia, falls, neglect    OT Frequency  2x / week    OT Duration  12 weeks    OT Treatment/Interventions  Self-care/ADL training;Visual/perceptual remediation/compensation;DME and/or AE instruction;Patient/family education;Moist Heat;Therapeutic exercise;Manual Therapy;Therapeutic activities;Neuromuscular education    Consulted and Agree with Plan of Care  Patient;Family member/caregiver    Family Member Consulted  wife, [name]       Patient will benefit from skilled therapeutic intervention in order to improve the following deficits and impairments:  Impaired vision/preception, Decreased coordination, Decreased safety awareness, Impaired UE functional use, Decreased strength  Visit Diagnosis: Muscle weakness (generalized)  Other lack of coordination  Neurologic neglect syndrome    Problem List Patient Active Problem List   Diagnosis Date Noted  . Sleep disturbance   . PAF (paroxysmal atrial fibrillation) ([place])   . Recurrent UTI   . Hypokalemia   . Hyponatremia   . Abnormal urine   . 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 Benign essential HTN   . Urinary retention   . History of gout   . Global aphasia   . SAH (subarachnoid hemorrhage) ([place])   . SDH (subdural hematoma)  ([place])   . Lymphocytosis   . Subdural hemorrhage following injury ([place]) 05/12/2016   Jamil Armwood T Gad Aymond, OTR/L, CLT  Zana Biancardi 08/12/2017, 8:54 PM  [place] MAIN [facility] SERVICES [address] [place], [place], [address] Phone: [phone]   Fax:  [phone]  Name: Yussef Jorge MRN: [id number] Date of Birth: [birth year]

## 2017-08-12 NOTE — Therapy (Signed)
[place] MAIN [facility] SERVICES [address] [place], [place], [address] Phone: [phone]   Fax:  [phone]  Occupational Therapy Treatment  Patient Details  Name: James [name] MRN: [id number] Date of Birth: [birth year] Referring Provider: Manuella Ghazi   Encounter Date: 08/01/2017  OT End of Session - 08/12/17 1605    Visit Number  6    Number of Visits  24    Date for OT Re-Evaluation  09/29/17    OT Start Time  1330    OT Stop Time  1415    OT Time Calculation (min)  45 min    Activity Tolerance  Patient tolerated treatment well    Behavior During Therapy  [facility] for tasks assessed/performed       Past Medical History:  Diagnosis Date  . Alcohol abuse, in remission   . Atrial fibrillation ([place])   . Bilateral renal masses   . Closed right ankle fracture   . 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 Gait disorder   . Gout   . Hypertension   . Seizure disorder ([place])   . 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     Past Surgical History:  Procedure Laterality Date  . CRANIECTOMY FOR DEPRESSED SKULL FRACTURE  2009   with MRSA infection  . CRANIOTOMY     X 2  . HERNIA REPAIR     during childhood  . ORIF ANKLE FRACTURE Left 2012    There were no vitals filed for this visit.  Subjective Assessment - 08/12/17 1556    Subjective   Patient indicating he has been working on tasks at home.  Wife reports she can tell a difference in his hand with picking up items and he is working on homework without being told to do so.     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  He suffered another fall in 2017.  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     Patient Stated Goals  to be able to use the right hand functionally    Currently in Pain?  No/denies     Multiple Pain Sites  No                   OT Treatments/Exercises (OP) - 08/12/17 1604      Fine Motor Coordination (Hand/Wrist)   Other Fine Motor Exercises  Patient seen this date with focus on graded pressure tasks with use of slotted cup, regular Styrofoam cup with and without water.  Also utilized a water bottle to work on pouring from bottle to cup and then pouring from cup to bottle with managing pressure without crushing cup.  Patient tends to punch a hole through coffee cup at home when holding with right hand.  Patient spilling water when attempting task but responds well to cues and therapist demo for technique and adjusting pressure while performing task.  Repeated trials completed.  Patient instructed on pressure when opening and closing water bottle so that water does not spout out the top or that the bottle crushes into the middle from grip.              OT Education - 08/12/17 1605    Education provided  Yes    Education Details  graded pressure, holding and opening water bottle    Person(s) Educated  Patient;Spouse    Methods  Explanation;Demonstration;Verbal cues    Comprehension  Verbal cues required;Returned demonstration;Verbalized understanding          OT Long Term Goals - 07/10/17 0946      OT LONG TERM GOAL #1   Title  Patient will improve right hand function to hold utensil in right hand for self feeding with modified independence.     Baseline  unable to hold utensil at eval    Time  12    Period  Weeks    Status  New    Target Date  09/29/17      OT LONG TERM GOAL #2   Title  Pt will increase hand strength in right by 5# to be able to cut food with modified independence.    Baseline  unable at eval     Time  12    Period  Weeks    Status  New    Target Date  09/29/17      OT LONG TERM GOAL #3   Title  Patient will demonstrate increased awareness and strategies to attend to right side to hold items without dropping items.     Baseline  drops items frequently    Time  12    Period  Weeks    Status  New    Target Date  09/29/17      OT LONG TERM GOAL #4   Title  Patient will demonstrate independence in home exercise program for ROM, strength and coordination of right hand.    Baseline  no current program    Time  6    Period  Weeks    Status  New    Target Date  08/18/17      OT LONG TERM GOAL #5   Title  Patient will demonstrate the ability to obtain items from right pocket 90% of the time.    Baseline  difficulty getting keys or coins out of right pocket    Time  12    Period  Weeks    Status  New    Target Date  09/30/16            Plan - 08/12/17 1606    Clinical Impression Statement  Patient is becoming more consistent with performing exercises at home and both the patient and his wife are seeing improvements in hand function when holding and reaching for items.  Patient responds well to cues during tasks but when distracted he is not as consistent.  Continue to work towards goals focusing on graded pressure tasks and functional hand use.     Occupational Profile and client history currently impacting functional performance  repeated falls, TBI, expressive aphasia, limited motion in right hand, lack of coordination from previous CVA    Occupational performance deficits (Please refer to evaluation for details):  ADL's;Social Participation;Leisure    Rehab Potential  Good    Current Impairments/barriers affecting progress:  expressive aphasia, falls, neglect    OT Frequency  2x / week    OT Duration  12 weeks    OT Treatment/Interventions  Self-care/ADL training;Visual/perceptual remediation/compensation;DME and/or AE instruction;Patient/family education;Moist Heat;Therapeutic exercise;Manual Therapy;Therapeutic activities;Neuromuscular education    Consulted and Agree with Plan of Care  Patient;Family member/caregiver    Family Member Consulted  wife, [name]       Patient will benefit from  skilled therapeutic intervention in order to improve  the following deficits and impairments:  Impaired vision/preception, Decreased coordination, Decreased safety awareness, Impaired UE functional use, Decreased strength  Visit Diagnosis: Muscle weakness (generalized)  Other lack of coordination  Neurologic neglect syndrome    Problem List Patient Active Problem List   Diagnosis Date Noted  . Sleep disturbance   . PAF (paroxysmal atrial fibrillation) ([place])   . Recurrent UTI   . Hypokalemia   . Hyponatremia   . Abnormal urine   . 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 Benign essential HTN   . Urinary retention   . History of gout   . Global aphasia   . SAH (subarachnoid hemorrhage) ([place])   . SDH (subdural hematoma) ([place])   . Lymphocytosis   . Subdural hemorrhage following injury ([place]) 05/12/2016   Analaura Messler T Audrionna Lampton, OTR/L, CLT  Ronna Herskowitz 08/12/2017, 4:09 PM  [place] MAIN [facility] SERVICES [address] [place], [place], [address] Phone: [phone]   Fax:  [phone]  Name: Broadus Costilla MRN: [id number] Date of Birth: 01-16-1941

## 2017-08-12 NOTE — Therapy (Signed)
[place] MAIN [facility] SERVICES [address] [place], [place], [address] Phone: [phone]   Fax:  [phone]  Occupational Therapy Treatment  Patient Details  Name: James [name] MRN: [id number] Date of Birth: 28-Jul-1940 Referring Provider: Manuella Ghazi   Encounter Date: 08/08/2017  OT End of Session - 08/12/17 1713    Visit Number  8    Number of Visits  24    Date for OT Re-Evaluation  09/29/17    OT Start Time  1301    OT Stop Time  1346    OT Time Calculation (min)  45 min    Activity Tolerance  Patient tolerated treatment well    Behavior During Therapy  [facility] for tasks assessed/performed       Past Medical History:  Diagnosis Date  . Alcohol abuse, in remission   . Atrial fibrillation ([place])   . Bilateral renal masses   . Closed right ankle fracture   . 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 Gait disorder   . Gout   . Hypertension   . Seizure disorder ([place])   . 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     Past Surgical History:  Procedure Laterality Date  . CRANIECTOMY FOR DEPRESSED SKULL FRACTURE  2009   with MRSA infection  . CRANIOTOMY     X 2  . HERNIA REPAIR     during childhood  . ORIF ANKLE FRACTURE Left 2012    There were no vitals filed for this visit.  Subjective Assessment - 08/12/17 1708    Subjective   Patient denies any pain, indicates he continues to work on homework at home.     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  He suffered another fall in 2017.  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     Patient Stated Goals  to be able to use the right hand functionally    Currently in Pain?  No/denies    Multiple Pain Sites  No                   OT Treatments/Exercises (OP) - 08/12/17 1709       ADLs   ADL Comments  Patient seen with focus on handwriting tasks with cues for pen grip on larger grip pen, cues for finger placement and tripod grasp.  Patient focusing on writing name, first name with cues for formation of letters, readjustment of pen grip, writing in print and script.  Greater legibilty with printing of letters.        Fine Motor Coordination (Hand/Wrist)   Other Fine Motor Exercises  Patient seen for focus on manipulation of glass beads from tabletop with some beads turned up and some turned flat side down.  Cues for prehension patterns on right to pick up and move items from tabletop to container.  Wife instructed on beads and she reports having some at home.              OT Education - 08/12/17 1713    Education provided  Yes    Education Details  manipulation of glass beads for [facility] on right    Person(s) Educated  Patient;Spouse    Methods  Explanation;Demonstration;Verbal cues    Comprehension  Verbal cues  required;Returned demonstration;Verbalized understanding          OT Long Term Goals - 08/12/17 1716      OT LONG TERM GOAL #1   Title  Patient will improve right hand function to hold utensil in right hand for self feeding with modified independence.     Baseline  unable to hold utensil at eval    Time  12    Period  Weeks    Status  On-going      OT LONG TERM GOAL #2   Title  Pt will increase hand strength in right by 5# to be able to cut food with modified independence.    Baseline  unable at eval     Time  12    Period  Weeks    Status  On-going      OT LONG TERM GOAL #3   Title  Patient will demonstrate increased awareness and strategies to attend to right side to hold items without dropping items.    Baseline  drops items frequently    Time  12    Period  Weeks    Status  On-going      OT LONG TERM GOAL #4   Title  Patient will demonstrate independence in home exercise program for ROM, strength and coordination of right hand.     Baseline  no current program    Time  6    Period  Weeks    Status  On-going      OT LONG TERM GOAL #5   Title  Patient will demonstrate the ability to obtain items from right pocket 90% of the time.    Baseline  difficulty getting keys or coins out of right pocket    Time  12    Period  Weeks    Status  On-going            Plan - 08/12/17 1714    Clinical Impression Statement  Patient responds well to cues, increased legibility of letters with handwriting from the beginning of session to end of session and patients wife reports it is the best she has ever seen him write since illness. Patient continues to progress with right hand use, graded pressure and the ability to pick up items.     Occupational Profile and client history currently impacting functional performance  repeated falls, TBI, expressive aphasia, limited motion in right hand, lack of coordination from previous CVA    Occupational performance deficits (Please refer to evaluation for details):  ADL's;Social Participation;Leisure    Rehab Potential  Good    Current Impairments/barriers affecting progress:  expressive aphasia, falls, neglect    OT Frequency  2x / week    OT Duration  12 weeks    OT Treatment/Interventions  Self-care/ADL training;Visual/perceptual remediation/compensation;DME and/or AE instruction;Patient/family education;Moist Heat;Therapeutic exercise;Manual Therapy;Therapeutic activities;Neuromuscular education    Consulted and Agree with Plan of Care  Patient;Family member/caregiver    Family Member Consulted  wife, [name]       Patient will benefit from skilled therapeutic intervention in order to improve the following deficits and impairments:  Impaired vision/preception, Decreased coordination, Decreased safety awareness, Impaired UE functional use, Decreased strength  Visit Diagnosis: Muscle weakness (generalized)  Other lack of coordination  Neurologic neglect syndrome    Problem  List Patient Active Problem List   Diagnosis Date Noted  . Sleep disturbance   . PAF (paroxysmal atrial fibrillation) ([place])   . Recurrent UTI   . Hypokalemia   .  Hyponatremia   . Abnormal urine   . 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 Benign essential HTN   . Urinary retention   . History of gout   . Global aphasia   . SAH (subarachnoid hemorrhage) ([place])   . SDH (subdural hematoma) ([place])   . Lymphocytosis   . Subdural hemorrhage following injury [facility]) 05/12/2016   Noel Rodier T Shamaya Kauer, OTR/L, CLT  Tashiba Timoney 08/12/2017, 5:17 PM  [place] MAIN [facility] SERVICES [address] [place], [place], [address] Phone: [phone]   Fax:  [phone]  Name: James [name] MRN: [id number] Date of Birth: 06-28-1941

## 2017-08-12 NOTE — Therapy (Signed)
[place] MAIN [facility] SERVICES [address] [place], [place], [address] Phone: [phone]   Fax:  [phone]  Occupational Therapy Treatment  Patient Details  Name: James [name] MRN: [id number] Date of Birth: 11-Mar-1941 Referring Provider: Manuella Ghazi   Encounter Date: 08/04/2017  OT End of Session - 08/12/17 1702    Visit Number  7    Number of Visits  24    Date for OT Re-Evaluation  09/29/17    OT Start Time  1115    OT Stop Time  1201    OT Time Calculation (min)  46 min    Activity Tolerance  Patient tolerated treatment well    Behavior During Therapy  [facility] for tasks assessed/performed       Past Medical History:  Diagnosis Date  . Alcohol abuse, in remission   . Atrial fibrillation ([place])   . Bilateral renal masses   . Closed right ankle fracture   . 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 Gait disorder   . Gout   . Hypertension   . Seizure disorder ([place])   . 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     Past Surgical History:  Procedure Laterality Date  . CRANIECTOMY FOR DEPRESSED SKULL FRACTURE  2009   with MRSA infection  . CRANIOTOMY     X 2  . HERNIA REPAIR     during childhood  . ORIF ANKLE FRACTURE Left 2012    There were no vitals filed for this visit.  Subjective Assessment - 08/12/17 1657    Subjective   Patient smiling and ready to work.  No complaints.  Wife reports patient has been working at home on exercises daily.    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  He suffered another fall in 2017.  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     Patient Stated Goals  to be able to use the right hand functionally    Currently in Pain?  Yes                   OT Treatments/Exercises (OP) - 08/12/17  1658      ADLs   ADL Comments  Patient seen for mobility while carrying cup of water in right hand with attention to right side and graded pressure with the cup.  Performed with 1# weight for increased proprioception and then without the weight.  Patient able to complete this date without spilling water or dropping cup.       Neurological Re-education Exercises   Other Exercises 1  Patient seen for holding items in right hand while performing tasks with left hand with increased distractions and to see how right hand will react during task.  Patient occasionally drops object out of right hand if increased focused is on left side. Cues help to redirect patients attention to right side. Patient also tends to grip objects too tightly at times and responds to cues for adjustment of grip.  multiple trials performed.       Fine Motor Coordination (Hand/Wrist)   Other Fine Motor Exercises  Patient seen this date with focus on graded pressure tasks with use of slotted cup, regular Styrofoam cup with and without water.  Also utilized a water bottle  to work on pouring from bottle to cup and then pouring from cup to bottle with managing pressure without crushing cup.  Patient tends to punch a hole through coffee cup at home when holding with right hand.  Patient spilling water when attempting task but responds well to cues and therapist demo for technique and adjusting pressure while performing task.  Repeated trials completed.  Patient instructed on pressure when opening and closing water bottle so that water does not spout out the top or that the bottle crushes into the middle from grip.              OT Education - 08/12/17 1701    Education provided  Yes    Education Details  HEP, gripping, graded pressure    Person(s) Educated  Patient;Spouse    Methods  Explanation;Demonstration;Verbal cues    Comprehension  Verbal cues required;Returned demonstration;Verbalized understanding          OT Long  Term Goals - 07/10/17 0946      OT LONG TERM GOAL #1   Title  Patient will improve right hand function to hold utensil in right hand for self feeding with modified independence.     Baseline  unable to hold utensil at eval    Time  12    Period  Weeks    Status  New    Target Date  09/29/17      OT LONG TERM GOAL #2   Title  Pt will increase hand strength in right by 5# to be able to cut food with modified independence.    Baseline  unable at eval     Time  12    Period  Weeks    Status  New    Target Date  09/29/17      OT LONG TERM GOAL #3   Title  Patient will demonstrate increased awareness and strategies to attend to right side to hold items without dropping items.    Baseline  drops items frequently    Time  12    Period  Weeks    Status  New    Target Date  09/29/17      OT LONG TERM GOAL #4   Title  Patient will demonstrate independence in home exercise program for ROM, strength and coordination of right hand.    Baseline  no current program    Time  6    Period  Weeks    Status  New    Target Date  08/18/17      OT LONG TERM GOAL #5   Title  Patient will demonstrate the ability to obtain items from right pocket 90% of the time.    Baseline  difficulty getting keys or coins out of right pocket    Time  12    Period  Weeks    Status  New    Target Date  09/30/16            Plan - 08/12/17 1702    Clinical Impression Statement  Patient continues to progress in the area of right UE function, grip and graded pressure activities. Patient responds well to cues during treatment session.  Patient tends to have the most difficulty with attending to right side when distracted with tasks involving left hand.  During the session patient was able to demonstrate divided attention to right hand during tasks with decreased dropping of items.  Continue to work towards improving right hand function.  Occupational Profile and client history currently impacting functional  performance  repeated falls, TBI, expressive aphasia, limited motion in right hand, lack of coordination from previous CVA    Occupational performance deficits (Please refer to evaluation for details):  ADL's;Social Participation;Leisure    Rehab Potential  Good    Current Impairments/barriers affecting progress:  expressive aphasia, falls, neglect    OT Frequency  2x / week    OT Duration  12 weeks    OT Treatment/Interventions  Self-care/ADL training;Visual/perceptual remediation/compensation;DME and/or AE instruction;Patient/family education;Moist Heat;Therapeutic exercise;Manual Therapy;Therapeutic activities;Neuromuscular education    Consulted and Agree with Plan of Care  Patient;Family member/caregiver    Family Member Consulted  wife, [name]       Patient will benefit from skilled therapeutic intervention in order to improve the following deficits and impairments:  Impaired vision/preception, Decreased coordination, Decreased safety awareness, Impaired UE functional use, Decreased strength  Visit Diagnosis: Muscle weakness (generalized)  Other lack of coordination  Neurologic neglect syndrome    Problem List Patient Active Problem List   Diagnosis Date Noted  . Sleep disturbance   . PAF (paroxysmal atrial fibrillation) ([place])   . Recurrent UTI   . Hypokalemia   . Hyponatremia   . Abnormal urine   . 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 Benign essential HTN   . Urinary retention   . History of gout   . Global aphasia   . SAH (subarachnoid hemorrhage) ([place])   . SDH (subdural hematoma) ([place])   . Lymphocytosis   . Subdural hemorrhage following injury ([place]) 05/12/2016   Lily Kernen T Lashea Goda, OTR/L, CLT  Mykela Mewborn 08/12/2017, 5:06 PM  [place] MAIN [facility] SERVICES [address] [place], [place], [address] Phone: [phone]   Fax:  [phone]  Name: James [name] MRN: [id number] Date of Birth: 04-02-1941

## 2017-08-15 ENCOUNTER — Ambulatory Visit: Payer: PPO | Admitting: Occupational Therapy

## 2017-08-15 DIAGNOSIS — R414 Neurologic neglect syndrome: Secondary | ICD-10-CM

## 2017-08-15 DIAGNOSIS — M6281 Muscle weakness (generalized): Secondary | ICD-10-CM

## 2017-08-15 DIAGNOSIS — R278 Other lack of coordination: Secondary | ICD-10-CM

## 2017-08-17 ENCOUNTER — Ambulatory Visit: Payer: PPO | Admitting: Occupational Therapy

## 2017-08-17 DIAGNOSIS — M6281 Muscle weakness (generalized): Secondary | ICD-10-CM | POA: Diagnosis not present

## 2017-08-17 DIAGNOSIS — R278 Other lack of coordination: Secondary | ICD-10-CM

## 2017-08-17 DIAGNOSIS — R414 Neurologic neglect syndrome: Secondary | ICD-10-CM

## 2017-08-22 ENCOUNTER — Encounter: Payer: PPO | Admitting: Occupational Therapy

## 2017-08-23 ENCOUNTER — Encounter: Payer: Self-pay | Admitting: Occupational Therapy

## 2017-08-24 ENCOUNTER — Encounter: Payer: Self-pay | Admitting: Occupational Therapy

## 2017-08-24 NOTE — Therapy (Signed)
[place] MAIN [facility] SERVICES [address] [place], [place], [address] Phone: [phone]   Fax:  [phone]  Occupational Therapy Treatment  Patient Details  Name: James [name] MRN: [id number] Date of Birth: May 30, 1941 Referring Provider: Manuella Ghazi   Encounter Date: 08/17/2017  OT End of Session - 08/24/17 1907    Visit Number  11    Number of Visits  24    Date for OT Re-Evaluation  09/29/17    Authorization Type  Medicare    OT Start Time  1300    OT Stop Time  1346    OT Time Calculation (min)  46 min    Activity Tolerance  Patient tolerated treatment well    Behavior During Therapy  [facility] for tasks assessed/performed       Past Medical History:  Diagnosis Date  . Alcohol abuse, in remission   . Atrial fibrillation ([place])   . Bilateral renal masses   . Closed right ankle fracture   . 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 Gait disorder   . Gout   . Hypertension   . Seizure disorder ([place])   . 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     Past Surgical History:  Procedure Laterality Date  . CRANIECTOMY FOR DEPRESSED SKULL FRACTURE  2009   with MRSA infection  . CRANIOTOMY     X 2  . HERNIA REPAIR     during childhood  . ORIF ANKLE FRACTURE Left 2012    There were no vitals filed for this visit.  Subjective Assessment - 08/24/17 1854    Subjective   Patient indicates he is doing well, reports he used to help with the laundry but hasn't been able to use his arm much to help in the more recent past.     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  He suffered another fall in 2017.  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     Patient Stated Goals  to be able to use the right hand functionally    Currently in Pain?  No/denies    Multiple Pain Sites  No                   OT Treatments/Exercises (OP) - 08/24/17 1910      ADLs   ADL Comments  Patient seen for laundry tasks, folding towels, washcloths with cues for grasp, pinch on right side and attention to task. Continued to work on handwriting with secure pen grip reinforcing tripod grasp, letter formation and working towards increased legibility.       Neurological Re-education Exercises   Other Exercises 1  Patient seen for graded pressure activities with emphasis on lifting objects that are heavy and then objects that are lightweight without crushing objects with increased grip.  Patient using vision and attention to adjust and grade pressure.  Still drops items occasionally but is doing better with holding utensils.              OT Education - 08/24/17 1906    Education provided  Yes    Education Details  HEP, laundry activities    Person(s) Educated  Patient;Spouse    Methods  Explanation;Demonstration;Verbal cues    Comprehension  Verbal cues required;Returned  demonstration;Verbalized understanding          OT Long Term Goals - 08/12/17 1716      OT LONG TERM GOAL #1   Title  Patient will improve right hand function to hold utensil in right hand for self feeding with modified independence.     Baseline  unable to hold utensil at eval    Time  12    Period  Weeks    Status  On-going      OT LONG TERM GOAL #2   Title  Pt will increase hand strength in right by 5# to be able to cut food with modified independence.    Baseline  unable at eval     Time  12    Period  Weeks    Status  On-going      OT LONG TERM GOAL #3   Title  Patient will demonstrate increased awareness and strategies to attend to right side to hold items without dropping items.    Baseline  drops items frequently    Time  12    Period  Weeks    Status  On-going      OT LONG TERM GOAL #4   Title  Patient will demonstrate independence in home exercise  program for ROM, strength and coordination of right hand.    Baseline  no current program    Time  6    Period  Weeks    Status  On-going      OT LONG TERM GOAL #5   Title  Patient will demonstrate the ability to obtain items from right pocket 90% of the time.    Baseline  difficulty getting keys or coins out of right pocket    Time  12    Period  Weeks    Status  On-going            Plan - 08/24/17 1907    Clinical Impression Statement  Patient has continued to show good gains in attention to right UE, graded pressure activities and using the hand more in functional tasks.  Patient and wife pleased with progress.  Patient able to demonstrate folding laundry this date in the clinic and plans to help at home starting with folding towels. Patient improving with hand writing skills with pen grip and formation of strokes for first name. Continue to work towards goals in plan of care.     Occupational Profile and client history currently impacting functional performance  repeated falls, TBI, expressive aphasia, limited motion in right hand, lack of coordination from previous CVA    Occupational performance deficits (Please refer to evaluation for details):  ADL's;Social Participation;Leisure    Rehab Potential  Good    Current Impairments/barriers affecting progress:  expressive aphasia, falls, neglect    OT Frequency  2x / week    OT Duration  12 weeks    OT Treatment/Interventions  Self-care/ADL training;Visual/perceptual remediation/compensation;DME and/or AE instruction;Patient/family education;Moist Heat;Therapeutic exercise;Manual Therapy;Therapeutic activities;Neuromuscular education    Consulted and Agree with Plan of Care  Patient    Family Member Consulted  wife, [name]       Patient will benefit from skilled therapeutic intervention in order to improve the following deficits and impairments:  Impaired vision/preception, Decreased coordination, Decreased safety awareness,  Impaired UE functional use, Decreased strength  Visit Diagnosis: Muscle weakness (generalized)  Other lack of coordination  Neurologic neglect syndrome    Problem List Patient Active Problem List   Diagnosis Date Noted  .  Sleep disturbance   . PAF (paroxysmal atrial fibrillation) ([place])   . Recurrent UTI   . Hypokalemia   . Hyponatremia   . Abnormal urine   . 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 Benign essential HTN   . Urinary retention   . History of gout   . Global aphasia   . SAH (subarachnoid hemorrhage) ([place])   . SDH (subdural hematoma) ([place])   . Lymphocytosis   . Subdural hemorrhage following injury [facility]) 05/12/2016   Amy T Lovett, OTR/L, CLT  Lovett,Amy 08/24/2017, 7:13 PM  [place] MAIN [facility] SERVICES [address] [place], [place], [address] Phone: [phone]   Fax:  [phone]  Name: James [name] MRN: [id number] Date of Birth: Feb 07, 1941

## 2017-08-24 NOTE — Therapy (Signed)
[place] MAIN [facility] SERVICES [address] [place], [place], [address] Phone: [phone]   Fax:  [phone]  Occupational Therapy Treatment  Patient Details  Name: James [name] MRN: [id number] Date of Birth: May 22, 1941 Referring Provider: Manuella Ghazi   Encounter Date: 08/15/2017  OT End of Session - 08/23/17 2122    Visit Number  10    Number of Visits  24    Date for OT Re-Evaluation  09/29/17    Authorization Type  Medicare    OT Start Time  1259    OT Stop Time  1345    OT Time Calculation (min)  46 min    Activity Tolerance  Patient tolerated treatment well    Behavior During Therapy  [facility] for tasks assessed/performed       Past Medical History:  Diagnosis Date  . Alcohol abuse, in remission   . Atrial fibrillation ([place])   . Bilateral renal masses   . Closed right ankle fracture   . 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 Gait disorder   . Gout   . Hypertension   . Seizure disorder ([place])   . 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     Past Surgical History:  Procedure Laterality Date  . CRANIECTOMY FOR DEPRESSED SKULL FRACTURE  2009   with MRSA infection  . CRANIOTOMY     X 2  . HERNIA REPAIR     during childhood  . ORIF ANKLE FRACTURE Left 2012    There were no vitals filed for this visit.  Subjective Assessment - 08/23/17 2121    Subjective   Patient smiling and ready to work, wife reports they are going on a trip and will miss an appt next week. Leaving at the end of this week.     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  He suffered another fall in 2017.  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     Patient Stated Goals  to be able to use the right hand functionally    Currently in Pain?  No/denies    Multiple  Pain Sites  No                   OT Treatments/Exercises (OP) - 08/24/17 0819      ADLs   ADL Comments  Continued focus on handwriting tasks with cues for pen grip on larger grip pen, cues for finger placement and tripod grasp.  Patient focusing on writing name, first name with cues for formation of letters, readjustment of pen grip, writing in print and script.  Resistive scooping motion with right hand with resistive putty to simulate scooping ice cream and then alternating to scooping consistency of cool whip with cues for the adjustment in graded pressure of the hand.  Fork and spoon use for simulated pasta management, cues for tightness of grip on right side.       Neurological Re-education Exercises   Other Exercises 1  Patient seen for manipulation of 100 pegboard to attempt to turn items in hand with cues to flip from one end to the next and place into board.  Knotting and unknotting exercises with BUE with cues for right hand leading task.  OT Education - 08/23/17 2122    Education provided  Yes    Education Details  graded pressure activities, coordination tasks with RUE    Person(s) Educated  Patient;Spouse    Methods  Explanation;Demonstration;Verbal cues    Comprehension  Verbal cues required;Returned demonstration;Verbalized understanding          OT Long Term Goals - 08/12/17 1716      OT LONG TERM GOAL #1   Title  Patient will improve right hand function to hold utensil in right hand for self feeding with modified independence.     Baseline  unable to hold utensil at eval    Time  12    Period  Weeks    Status  On-going      OT LONG TERM GOAL #2   Title  Pt will increase hand strength in right by 5# to be able to cut food with modified independence.    Baseline  unable at eval     Time  12    Period  Weeks    Status  On-going      OT LONG TERM GOAL #3   Title  Patient will demonstrate increased awareness and strategies to  attend to right side to hold items without dropping items.    Baseline  drops items frequently    Time  12    Period  Weeks    Status  On-going      OT LONG TERM GOAL #4   Title  Patient will demonstrate independence in home exercise program for ROM, strength and coordination of right hand.    Baseline  no current program    Time  6    Period  Weeks    Status  On-going      OT LONG TERM GOAL #5   Title  Patient will demonstrate the ability to obtain items from right pocket 90% of the time.    Baseline  difficulty getting keys or coins out of right pocket    Time  12    Period  Weeks    Status  On-going            Plan - 08/23/17 2123    Clinical Impression Statement  Wife reports she feels patient is improving at home with hand skills when feeding self and when picking up items, he is more diligent with doing his exercises at home which she has not seen in the past.  She feels he can see the connection in what he is doing in therapy to real life situations and more aware of his limitations. Continue to focus on integration of graded pressure activities and increased awareness to right UE.     Occupational Profile and client history currently impacting functional performance  repeated falls, TBI, expressive aphasia, limited motion in right hand, lack of coordination from previous CVA    Occupational performance deficits (Please refer to evaluation for details):  ADL's;Social Participation;Leisure    Rehab Potential  Good    Current Impairments/barriers affecting progress:  expressive aphasia, falls, neglect    OT Frequency  2x / week    OT Duration  12 weeks    OT Treatment/Interventions  Self-care/ADL training;Visual/perceptual remediation/compensation;DME and/or AE instruction;Patient/family education;Moist Heat;Therapeutic exercise;Manual Therapy;Therapeutic activities;Neuromuscular education    Consulted and Agree with Plan of Care  Patient    Family Member Consulted  wife,  [name]       Patient will benefit from skilled therapeutic intervention in order to improve  the following deficits and impairments:  Impaired vision/preception, Decreased coordination, Decreased safety awareness, Impaired UE functional use, Decreased strength  Visit Diagnosis: Muscle weakness (generalized)  Other lack of coordination  Neurologic neglect syndrome    Problem List Patient Active Problem List   Diagnosis Date Noted  . Sleep disturbance   . PAF (paroxysmal atrial fibrillation) ([place])   . Recurrent UTI   . Hypokalemia   . Hyponatremia   . Abnormal urine   . 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 Benign essential HTN   . Urinary retention   . History of gout   . Global aphasia   . SAH (subarachnoid hemorrhage) ([place])   . SDH (subdural hematoma) ([place])   . Lymphocytosis   . Subdural hemorrhage following injury [facility]) 05/12/2016   Miaa Latterell T Sherilee Smotherman, OTR/L, CLT  Oluwaseyi Raffel 08/24/2017, 8:34 AM  [place] MAIN [facility] SERVICES [address] [place], [place], [address] Phone: [phone]   Fax:  [phone]  Name: Emmanuell Kantz MRN: [id number] Date of Birth: 07/24/1941

## 2017-08-25 ENCOUNTER — Ambulatory Visit: Payer: PPO | Attending: Neurology | Admitting: Occupational Therapy

## 2017-08-25 ENCOUNTER — Encounter: Payer: Self-pay | Admitting: Occupational Therapy

## 2017-08-25 DIAGNOSIS — R414 Neurologic neglect syndrome: Secondary | ICD-10-CM | POA: Insufficient documentation

## 2017-08-25 DIAGNOSIS — R278 Other lack of coordination: Secondary | ICD-10-CM | POA: Insufficient documentation

## 2017-08-25 DIAGNOSIS — M6281 Muscle weakness (generalized): Secondary | ICD-10-CM | POA: Diagnosis not present

## 2017-08-25 NOTE — Therapy (Addendum)
[place] MAIN [facility] SERVICES [address] [place], [place], [address] Phone: [phone]   Fax:  [phone]  Occupational Therapy Treatment  Patient Details  Name: James [name] MRN: [id number] Date of Birth: 1941-03-14 Referring Provider: Manuella Ghazi   Encounter Date: 08/25/2017  OT End of Session - 08/25/17 1032    Visit Number  12    Number of Visits  24    Date for OT Re-Evaluation  09/29/17    OT Start Time  0945    OT Stop Time  1030    OT Time Calculation (min)  45 min    Activity Tolerance  Patient tolerated treatment well    Behavior During Therapy  [facility] for tasks assessed/performed       Past Medical History:  Diagnosis Date  . Alcohol abuse, in remission   . Atrial fibrillation ([place])   . Bilateral renal masses   . Closed right ankle fracture   . 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 Gait disorder   . Gout   . Hypertension   . Seizure disorder ([place])   . 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     Past Surgical History:  Procedure Laterality Date  . CRANIECTOMY FOR DEPRESSED SKULL FRACTURE  2009   with MRSA infection  . CRANIOTOMY     X 2  . HERNIA REPAIR     during childhood  . ORIF ANKLE FRACTURE Left 2012    There were no vitals filed for this visit.  Subjective Assessment - 08/25/17 1017    Subjective   Pt. has been travelling.    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  He suffered another fall in 2017.  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     Patient Stated Goals  to be able to use the right hand functionally    Currently in Pain?  No/denies      OT TREATMENT    Neuro muscular re-education:  Pt. worked on tasks to accommodate grip, and grasp on  objects as the weight of the objects change. Pt. Worked on alternating between grasping light objects, and heavier objects. Pt. worked on holding a light weight cup with his right hand, while reaching, and accessing cabinetry with his left hand. Pt. Dropped the cup multiple times. Pt. Compensated proximally while holding the cup with his right hand, and reach with his left hand. Pt. worked on writing tasks with his right hand with emphasis placed on pen grasp.   Therapeutic Exercise:   Pt. performed right gross gripping with grip strengthener. Pt. worked on sustaining grip while grasping pegs and reaching at various heights. Gripper was placed in the 3rd resistive slot with the white resistive spring.                  OT Treatments/Exercises (OP) - 08/24/17 1910      ADLs   ADL Comments  Patient seen for laundry tasks, folding towels, washcloths with cues for grasp, pinch on right side and attention to task. Continued to work on handwriting with secure pen grip reinforcing tripod grasp, letter formation and working towards increased legibility.       Neurological Re-education Exercises  Other Exercises 1  Patient seen for graded pressure activities with emphasis on lifting objects that are heavy and then objects that are lightweight without crushing objects with increased grip.  Patient using vision and attention to adjust and grade pressure.  Still drops items occasionally but is doing better with holding utensils.              OT Education - 08/25/17 1032    Education provided  Yes    Education Details  RUE hand function, and writing. RUE awareness.    Person(s) Educated  Patient;Spouse    Methods  Explanation;Demonstration    Comprehension  Verbalized understanding;Returned demonstration;Verbal cues required          OT Long Term Goals - 08/12/17 1716      OT LONG TERM GOAL #1   Title  Patient will improve right hand function to hold utensil in right hand for  self feeding with modified independence.     Baseline  unable to hold utensil at eval    Time  12    Period  Weeks    Status  On-going      OT LONG TERM GOAL #2   Title  Pt will increase hand strength in right by 5# to be able to cut food with modified independence.    Baseline  unable at eval     Time  12    Period  Weeks    Status  On-going      OT LONG TERM GOAL #3   Title  Patient will demonstrate increased awareness and strategies to attend to right side to hold items without dropping items.    Baseline  drops items frequently    Time  12    Period  Weeks    Status  On-going      OT LONG TERM GOAL #4   Title  Patient will demonstrate independence in home exercise program for ROM, strength and coordination of right hand.    Baseline  no current program    Time  6    Period  Weeks    Status  On-going      OT LONG TERM GOAL #5   Title  Patient will demonstrate the ability to obtain items from right pocket 90% of the time.    Baseline  difficulty getting keys or coins out of right pocket    Time  12    Period  Weeks    Status  On-going            Plan - 08/25/17 1033    Clinical Impression Statement  Pt. continues to work on acommodating, and adjusting to various weights of objects with his right hand. Pt. continues to have difficulty holding an object in his right hand while reaching, and using his left hand. Pt. requires cues to increase awareness of the RUE when attention is devided.     Occupational Profile and client history currently impacting functional performance  repeated falls, TBI, expressive aphasia, limited motion in right hand, lack of coordination from previous CVA    Occupational performance deficits (Please refer to evaluation for details):  ADL's    Rehab Potential  Good    Current Impairments/barriers affecting progress:  expressive aphasia, falls, neglect    OT Frequency  2x / week    OT Treatment/Interventions  Self-care/ADL  training;Visual/perceptual remediation/compensation;DME and/or AE instruction;Patient/family education;Moist Heat;Therapeutic exercise;Manual Therapy;Therapeutic activities;Neuromuscular education    Clinical Decision Making  Several treatment options, min-mod  task modification necessary    Consulted and Agree with Plan of Care  Patient    Family Member Consulted  wife, [name]       Patient will benefit from skilled therapeutic intervention in order to improve the following deficits and impairments:  Impaired vision/preception, Decreased coordination, Decreased safety awareness, Impaired UE functional use, Decreased strength  Visit Diagnosis: Muscle weakness (generalized)  Other lack of coordination    Problem List Patient Active Problem List   Diagnosis Date Noted  . Sleep disturbance   . PAF (paroxysmal atrial fibrillation) ([place])   . Recurrent UTI   . Hypokalemia   . Hyponatremia   . Abnormal urine   . 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 Benign essential HTN   . Urinary retention   . History of gout   . Global aphasia   . SAH (subarachnoid hemorrhage) ([place])   . SDH (subdural hematoma) ([place])   . Lymphocytosis   . Subdural hemorrhage following injury ([place]) 05/12/2016    Harrel Carina, MS, OTR/L 08/25/2017, 10:42 AM  [place] MAIN [facility] SERVICES [address] [place], [place], [address] Phone: [phone]   Fax:  [phone]  Name: James [name] MRN: [id number] Date of Birth: Mar 27, 1941

## 2017-08-29 ENCOUNTER — Ambulatory Visit: Payer: PPO | Admitting: Occupational Therapy

## 2017-08-29 DIAGNOSIS — R414 Neurologic neglect syndrome: Secondary | ICD-10-CM

## 2017-08-29 DIAGNOSIS — M6281 Muscle weakness (generalized): Secondary | ICD-10-CM | POA: Diagnosis not present

## 2017-08-29 DIAGNOSIS — R278 Other lack of coordination: Secondary | ICD-10-CM

## 2017-08-30 ENCOUNTER — Encounter: Payer: Self-pay | Admitting: Occupational Therapy

## 2017-08-30 NOTE — Therapy (Signed)
[place] MAIN [facility] SERVICES [address] [place], [place], [address] Phone: [phone]   Fax:  [phone]  Occupational Therapy Treatment  Patient Details  Name: James [name] MRN: [id number] Date of Birth: [birth year] Referring Provider: Manuella Ghazi   Encounter Date: 08/29/2017  OT End of Session - 08/30/17 1615    Visit Number  13    Number of Visits  24    Date for OT Re-Evaluation  09/29/17    Authorization Type  Medicare    OT Start Time  1300    OT Stop Time  4259    OT Time Calculation (min)  47 min    Activity Tolerance  Patient tolerated treatment well    Behavior During Therapy  [facility] for tasks assessed/performed       Past Medical History:  Diagnosis Date  . Alcohol abuse, in remission   . Atrial fibrillation ([place])   . Bilateral renal masses   . Closed right ankle fracture   . 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 Gait disorder   . Gout   . Hypertension   . Seizure disorder ([place])   . 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     Past Surgical History:  Procedure Laterality Date  . CRANIECTOMY FOR DEPRESSED SKULL FRACTURE  2009   with MRSA infection  . CRANIOTOMY     X 2  . HERNIA REPAIR     during childhood  . ORIF ANKLE FRACTURE Left 2012    There were no vitals filed for this visit.  Subjective Assessment - 08/30/17 1614    Subjective   Patient indicates he is doing well at home, went on a trip and did some exercises but not as much as he should have.     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  He suffered another fall in 2017.  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     Patient Stated Goals  to be able to use the right hand functionally    Currently in Pain?  No/denies    Multiple Pain Sites  No                    OT Treatments/Exercises (OP) - 08/30/17 1617      ADLs   ADL Comments  Patient seen this date for grooming session with focus on utilizing electric razor for shaving.  Patient instructed on electric shaver use for turning on and off, shaving neck with shaver in right hand with cues for positioning and occasional guiding from therapist, instructed to use left hand to get areas on face just above the beard to trim.  Patient instructed and demonstrating how to open shaver and clean for maintenance and return top to snap in place, right and left hands used.        Fine Motor Coordination (Hand/Wrist)   Other Fine Motor Exercises  Patient seen for manipulation of 100 pegboard pieces for picking up with right hand, moving to palm and getting another piece in thumb and index.  When returning items to bucket, he often forgets he has the one item in the palm and requires cues to attend to hand and then able to correct.  Mutliple trials repeated and wife educated on  items to use at home for similar task.              OT Education - 08/30/17 1615    Education provided  Yes    Education Details  RUE use in daily tasks, manipulation skills    Person(s) Educated  Patient    Methods  Explanation;Demonstration;Verbal cues    Comprehension  Verbal cues required;Returned demonstration;Verbalized understanding          OT Long Term Goals - 08/12/17 1716      OT LONG TERM GOAL #1   Title  Patient will improve right hand function to hold utensil in right hand for self feeding with modified independence.     Baseline  unable to hold utensil at eval    Time  12    Period  Weeks    Status  On-going      OT LONG TERM GOAL #2   Title  Pt will increase hand strength in right by 5# to be able to cut food with modified independence.    Baseline  unable at eval     Time  12    Period  Weeks    Status  On-going      OT LONG TERM GOAL #3   Title  Patient will demonstrate  increased awareness and strategies to attend to right side to hold items without dropping items.    Baseline  drops items frequently    Time  12    Period  Weeks    Status  On-going      OT LONG TERM GOAL #4   Title  Patient will demonstrate independence in home exercise program for ROM, strength and coordination of right hand.    Baseline  no current program    Time  6    Period  Weeks    Status  On-going      OT LONG TERM GOAL #5   Title  Patient will demonstrate the ability to obtain items from right pocket 90% of the time.    Baseline  difficulty getting keys or coins out of right pocket    Time  12    Period  Weeks    Status  On-going            Plan - 08/30/17 1615    Clinical Impression Statement  Patient demonstrating use of electric shaver with cues for positioning of shaver on neck area to shave, use of right hand and how to open shaver to clean and perform maintenance.  He was pleased with his performance and will plan to try some shaving at home under wifes supervision.  Patient progressing to being able to demonstrate using the right hand for storage but often requires cues to attend to hand and remind him that object is still present in his palm.     Occupational Profile and client history currently impacting functional performance  repeated falls, TBI, expressive aphasia, limited motion in right hand, lack of coordination from previous CVA    Occupational performance deficits (Please refer to evaluation for details):  ADL's    Rehab Potential  Good    Current Impairments/barriers affecting progress:  expressive aphasia, falls, neglect    OT Frequency  2x / week    OT Duration  12 weeks    OT Treatment/Interventions  Self-care/ADL training;Visual/perceptual remediation/compensation;DME and/or AE instruction;Patient/family education;Moist Heat;Therapeutic exercise;Manual Therapy;Therapeutic activities;Neuromuscular education    Consulted and Agree with Plan of Care   Patient  Patient will benefit from skilled therapeutic intervention in order to improve the following deficits and impairments:  Impaired vision/preception, Decreased coordination, Decreased safety awareness, Impaired UE functional use, Decreased strength  Visit Diagnosis: Muscle weakness (generalized)  Other lack of coordination  Neurologic neglect syndrome    Problem List Patient Active Problem List   Diagnosis Date Noted  . Sleep disturbance   . PAF (paroxysmal atrial fibrillation) ([place])   . Recurrent UTI   . Hypokalemia   . Hyponatremia   . Abnormal urine   . 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 Benign essential HTN   . Urinary retention   . History of gout   . Global aphasia   . SAH (subarachnoid hemorrhage) ([place])   . SDH (subdural hematoma) ([place])   . Lymphocytosis   . Subdural hemorrhage following injury ([place]) 05/12/2016   Amy T Lovett, OTR/L, CLT  Lovett,Amy 08/30/2017, 4:23 PM  [place] MAIN [facility] SERVICES [address] [place], [place], [address] Phone: [phone]   Fax:  [phone]  Name: Lott Seelbach MRN: [id number] Date of Birth: [birth year]

## 2017-08-31 ENCOUNTER — Ambulatory Visit: Payer: PPO | Admitting: Occupational Therapy

## 2017-08-31 DIAGNOSIS — R414 Neurologic neglect syndrome: Secondary | ICD-10-CM

## 2017-08-31 DIAGNOSIS — M6281 Muscle weakness (generalized): Secondary | ICD-10-CM

## 2017-08-31 DIAGNOSIS — R278 Other lack of coordination: Secondary | ICD-10-CM

## 2017-09-07 ENCOUNTER — Ambulatory Visit: Payer: PPO | Admitting: Occupational Therapy

## 2017-09-07 DIAGNOSIS — M6281 Muscle weakness (generalized): Secondary | ICD-10-CM

## 2017-09-07 DIAGNOSIS — R414 Neurologic neglect syndrome: Secondary | ICD-10-CM

## 2017-09-07 DIAGNOSIS — R278 Other lack of coordination: Secondary | ICD-10-CM

## 2017-09-09 ENCOUNTER — Encounter: Payer: Self-pay | Admitting: Occupational Therapy

## 2017-09-09 NOTE — Therapy (Signed)
[place] MAIN [facility] SERVICES [address] [place], [place], [address] Phone: [phone]   Fax:  [phone]  Occupational Therapy Treatment  Patient Details  Name: James [name] MRN: [id number] Date of Birth: 1941-03-22 Referring Provider: Manuella Ghazi   Encounter Date: 08/31/2017  OT End of Session - 09/09/17 1231    Visit Number  14    Number of Visits  24    Date for OT Re-Evaluation  09/29/17    Authorization Type  Medicare    OT Start Time  1301    OT Stop Time  9702    OT Time Calculation (min)  46 min    Activity Tolerance  Patient tolerated treatment well    Behavior During Therapy  [facility] for tasks assessed/performed       Past Medical History:  Diagnosis Date  . Alcohol abuse, in remission   . Atrial fibrillation ([place])   . Bilateral renal masses   . Closed right ankle fracture   . 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 Gait disorder   . Gout   . Hypertension   . Seizure disorder ([place])   . 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     Past Surgical History:  Procedure Laterality Date  . CRANIECTOMY FOR DEPRESSED SKULL FRACTURE  2009   with MRSA infection  . CRANIOTOMY     X 2  . HERNIA REPAIR     during childhood  . ORIF ANKLE FRACTURE Left 2012    There were no vitals filed for this visit.  Subjective Assessment - 09/09/17 1229    Subjective   Patient's wife reports she can tell a difference in his right hand when picking up items, feeding self.     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  He suffered another fall in 2017.  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     Patient Stated Goals  to be able to use the right hand functionally    Currently in Pain?  No/denies    Multiple Pain Sites  No                    OT Treatments/Exercises (OP) - 09/09/17 1310      ADLs   ADL Comments  Patient seen for graded pressure exercises to pick up styrofoam cups with varying levels of water in the cup with cues for adjusting pressure and grip around cup.  Worked towards opening/closing water bottles with correct pressure to not to overflow when opening new bottle.       Neurological Re-education Exercises   Other Exercises 1  Patient seen for reaching with right hand for resistive velcro squares with thumb and index finger with cues for prehension patterns, placing onto grid in a variety of planes of motion.  Patient seen for alternating grip light to heavy alternating levels with right hand.      Fine Motor Coordination (Hand/Wrist)   Other Fine Motor Exercises  --             OT Education - 09/09/17 1231    Education provided  Yes    Education Details  R hand graded pressure tasks    Person(s) Educated  Patient    Methods  Explanation;Demonstration;Verbal  cues    Comprehension  Verbal cues required;Returned demonstration;Verbalized understanding          OT Long Term Goals - 08/12/17 1716      OT LONG TERM GOAL #1   Title  Patient will improve right hand function to hold utensil in right hand for self feeding with modified independence.     Baseline  unable to hold utensil at eval    Time  12    Period  Weeks    Status  On-going      OT LONG TERM GOAL #2   Title  Pt will increase hand strength in right by 5# to be able to cut food with modified independence.    Baseline  unable at eval     Time  12    Period  Weeks    Status  On-going      OT LONG TERM GOAL #3   Title  Patient will demonstrate increased awareness and strategies to attend to right side to hold items without dropping items.    Baseline  drops items frequently    Time  12    Period  Weeks    Status  On-going      OT LONG TERM GOAL #4   Title  Patient will demonstrate independence in home  exercise program for ROM, strength and coordination of right hand.    Baseline  no current program    Time  6    Period  Weeks    Status  On-going      OT LONG TERM GOAL #5   Title  Patient will demonstrate the ability to obtain items from right pocket 90% of the time.    Baseline  difficulty getting keys or coins out of right pocket    Time  12    Period  Weeks    Status  On-going            Plan - 09/09/17 1232    Clinical Impression Statement  Patient seen for continued focus on graded pressure activities with right hand with varying levels of weight, resistance and size.  Patient able to adjust grip with focus and occasional cues and is starting to be able to vary his pressure automatically with decreased cues provided.  Patient continues to progress with right hand function and able to feed self a variety of food consistencies with decreased spillage.    Occupational Profile and client history currently impacting functional performance  repeated falls, TBI, expressive aphasia, limited motion in right hand, lack of coordination from previous CVA    Occupational performance deficits (Please refer to evaluation for details):  ADL's;IADL's;Leisure    Rehab Potential  Good    Current Impairments/barriers affecting progress:  expressive aphasia, falls, neglect    OT Frequency  2x / week    OT Duration  12 weeks    OT Treatment/Interventions  Self-care/ADL training;Visual/perceptual remediation/compensation;DME and/or AE instruction;Patient/family education;Moist Heat;Therapeutic exercise;Manual Therapy;Therapeutic activities;Neuromuscular education    Consulted and Agree with Plan of Care  Patient       Patient will benefit from skilled therapeutic intervention in order to improve the following deficits and impairments:  Impaired vision/preception, Decreased coordination, Decreased safety awareness, Impaired UE functional use, Decreased strength  Visit Diagnosis: Muscle weakness  (generalized)  Other lack of coordination  Neurologic neglect syndrome    Problem List Patient Active Problem List   Diagnosis Date Noted  . Sleep disturbance   . PAF (paroxysmal atrial fibrillation) (  [place])   . Recurrent UTI   . Hypokalemia   . Hyponatremia   . Abnormal urine   . 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 Benign essential HTN   . Urinary retention   . History of gout   . Global aphasia   . SAH (subarachnoid hemorrhage) ([place])   . SDH (subdural hematoma) ([place])   . Lymphocytosis   . Subdural hemorrhage following injury ([place]) 05/12/2016   Kayann Maj T Deniya Craigo, OTR/L, CLT  Deborah Dondero 09/09/2017, 1:22 PM  [place] MAIN [facility] SERVICES [address] [place], [place], [address] Phone: [phone]   Fax:  [phone]  Name: James [name] MRN: [id number] Date of Birth: 28-Mar-1941

## 2017-09-09 NOTE — Therapy (Signed)
[place] MAIN [facility] SERVICES [address] [place], [place], [address] Phone: [phone]   Fax:  [phone]  Occupational Therapy Treatment  Patient Details  Name: James [name] MRN: [id number] Date of Birth: 1940-10-15 Referring Provider: Manuella Ghazi   Encounter Date: 09/07/2017  OT End of Session - 09/09/17 1509    Visit Number  15    Number of Visits  24    Date for OT Re-Evaluation  09/29/17    Authorization Type  Medicare    OT Start Time  1115    OT Stop Time  1200    OT Time Calculation (min)  45 min    Activity Tolerance  Patient tolerated treatment well    Behavior During Therapy  [facility] for tasks assessed/performed       Past Medical History:  Diagnosis Date  . Alcohol abuse, in remission   . Atrial fibrillation ([place])   . Bilateral renal masses   . Closed right ankle fracture   . 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 Gait disorder   . Gout   . Hypertension   . Seizure disorder ([place])   . 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     Past Surgical History:  Procedure Laterality Date  . CRANIECTOMY FOR DEPRESSED SKULL FRACTURE  2009   with MRSA infection  . CRANIOTOMY     X 2  . HERNIA REPAIR     during childhood  . ORIF ANKLE FRACTURE Left 2012    There were no vitals filed for this visit.  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  He suffered another fall in 2017.  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     Patient Stated Goals  to be able to use the right hand functionally    Currently in Pain?  No/denies    Multiple Pain Sites  No                   OT Treatments/Exercises (OP) - 09/09/17 1512      ADLs   ADL Comments  Patient seen for right handed scooping motion with built up spoon and resistive putty.  Use of rocker knife and built up fork to cut resistance of chicken/meat, moderate cues for motion for rocker knife and adjustments in grip. Formulations of meatballs with bilateral hands.       Neurological Re-education Exercises   Other Exercises 1  Patient seen for picking up and placing empty cup, 1/2 slotted cup and full slotted cup with focus on graded pressure, cues to vary pressure.  Reaching tasks multi planes and picking up items with right hand with varying weights, shapes.              OT Education - 09/09/17 1509    Education provided  Yes    Education Details  HEP, pouring with cups, water bottle    Person(s) Educated  Patient    Methods  Explanation;Demonstration;Verbal cues    Comprehension  Verbal cues required;Returned demonstration;Verbalized understanding          OT Long Term Goals - 08/12/17 1716      OT  LONG TERM GOAL #1   Title  Patient will improve right hand function to hold utensil in right hand for self feeding with modified independence.     Baseline  unable to hold utensil at eval    Time  12    Period  Weeks    Status  On-going      OT LONG TERM GOAL #2   Title  Pt will increase hand strength in right by 5# to be able to cut food with modified independence.    Baseline  unable at eval     Time  12    Period  Weeks    Status  On-going      OT LONG TERM GOAL #3   Title  Patient will demonstrate increased awareness and strategies to attend to right side to hold items without dropping items.    Baseline  drops items frequently    Time  12    Period  Weeks    Status  On-going      OT LONG TERM GOAL #4   Title  Patient will demonstrate independence in home exercise program for ROM, strength and coordination of right hand.    Baseline  no current program    Time  6    Period  Weeks    Status  On-going      OT LONG TERM GOAL #5   Title   Patient will demonstrate the ability to obtain items from right pocket 90% of the time.    Baseline  difficulty getting keys or coins out of right pocket    Time  12    Period  Weeks    Status  On-going            Plan - 09/09/17 1510    Clinical Impression Statement  Patient focused on utensil use this date with built up handles, use of rocker knife, cues required and occasional readjustment of grip.  Continued focus on graded pressure with various objects.     Occupational Profile and client history currently impacting functional performance  repeated falls, TBI, expressive aphasia, limited motion in right hand, lack of coordination from previous CVA    Occupational performance deficits (Please refer to evaluation for details):  ADL's;IADL's;Leisure    Rehab Potential  Good    Current Impairments/barriers affecting progress:  expressive aphasia, falls, neglect    OT Frequency  2x / week    OT Duration  12 weeks    OT Treatment/Interventions  Self-care/ADL training;Visual/perceptual remediation/compensation;DME and/or AE instruction;Patient/family education;Moist Heat;Therapeutic exercise;Manual Therapy;Therapeutic activities;Neuromuscular education    Consulted and Agree with Plan of Care  Patient       Patient will benefit from skilled therapeutic intervention in order to improve the following deficits and impairments:  Impaired vision/preception, Decreased coordination, Decreased safety awareness, Impaired UE functional use, Decreased strength  Visit Diagnosis: Muscle weakness (generalized)  Other lack of coordination  Neurologic neglect syndrome    Problem List Patient Active Problem List   Diagnosis Date Noted  . Sleep disturbance   . PAF (paroxysmal atrial fibrillation) ([place])   . Recurrent UTI   . Hypokalemia   . Hyponatremia   . Abnormal urine   . 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 Benign essential HTN   . Urinary retention   . History of  gout   . Global aphasia   . SAH (subarachnoid hemorrhage) ([place])   . SDH (subdural hematoma) ([place])   .  Lymphocytosis   . Subdural hemorrhage following injury [facility]) 05/12/2016   Amy T Lovett, OTR/L, CLT  Lovett,Amy 09/09/2017, 3:17 PM  [place] MAIN [facility] SERVICES [address] [place], [place], [address] Phone: [phone]   Fax:  [phone]  Name: James [name] MRN: [id number] Date of Birth: [birth year]

## 2017-09-11 ENCOUNTER — Encounter: Payer: Self-pay | Admitting: Occupational Therapy

## 2017-09-11 ENCOUNTER — Ambulatory Visit: Payer: PPO | Admitting: Occupational Therapy

## 2017-09-11 DIAGNOSIS — R278 Other lack of coordination: Secondary | ICD-10-CM

## 2017-09-11 DIAGNOSIS — R414 Neurologic neglect syndrome: Secondary | ICD-10-CM

## 2017-09-11 DIAGNOSIS — M6281 Muscle weakness (generalized): Secondary | ICD-10-CM

## 2017-09-12 ENCOUNTER — Ambulatory Visit: Payer: PPO | Admitting: Occupational Therapy

## 2017-09-14 NOTE — Therapy (Signed)
[place] MAIN [facility] SERVICES [address] [place], [place], [address] Phone: [phone]   Fax:  [phone]  Occupational Therapy Treatment  Patient Details  Name: James [name] MRN: [id number] Date of Birth: Feb 17, 1941 Referring Provider: Manuella Ghazi   Encounter Date: 09/11/2017  OT End of Session - 09/14/17 1656    Visit Number  17    Number of Visits  24    Date for OT Re-Evaluation  09/29/17    OT Start Time  0830    OT Stop Time  0915    OT Time Calculation (min)  45 min    Activity Tolerance  Patient tolerated treatment well    Behavior During Therapy  [facility] for tasks assessed/performed       Past Medical History:  Diagnosis Date  . Alcohol abuse, in remission   . Atrial fibrillation ([place])   . Bilateral renal masses   . Closed right ankle fracture   . 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 Gait disorder   . Gout   . Hypertension   . Seizure disorder ([place])   . 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     Past Surgical History:  Procedure Laterality Date  . CRANIECTOMY FOR DEPRESSED SKULL FRACTURE  2009   with MRSA infection  . CRANIOTOMY     X 2  . HERNIA REPAIR     during childhood  . ORIF ANKLE FRACTURE Left 2012    There were no vitals filed for this visit.  Subjective Assessment - 09/14/17 1651    Subjective   Patient asking about what progress therapist is seeing after working.  Patient states, "I'm sorry"  when trying to manipulate pieces and has difficulty.    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  He suffered another fall in 2017.  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     Patient Stated Goals  to be able to use the right hand functionally    Currently in Pain?  No/denies    Multiple Pain Sites  No                    OT Treatments/Exercises (OP) - 09/14/17 1651      Neurological Re-education Exercises   Digit Adduction       Other Exercises 1  Patient seen for facilitation of grasp and prehension patterns on right with cues for tripod grasp to pick up items, utilized a variety of shaped objects such as square velcro pieces, round ball pegs and judy board pegs (cylinder shaped) cues and therapist demo for placement of fingers onto object surface as well as cues for graded pressure.  Increased difficulty with round objects with decreased control depending on graded pressure limit. Switched to index and thumb prehension pattern and patient had difficulty with changing pattern consistently.  Patient seen for unknotting exercises to utilize bilateral UEs with 2 point prehension pattern and knots with varying degrees of tightness.               OT Education - 09/14/17 1656    Education provided  Yes    Education Details  prehension patterns for 2 point prehension, 3 point tripod grasp on objects    Person(s) Educated  Patient;Spouse  Methods  Explanation;Demonstration;Verbal cues    Comprehension  Verbal cues required;Returned demonstration;Verbalized understanding          OT Long Term Goals - 08/12/17 1716      OT LONG TERM GOAL #1   Title  Patient will improve right hand function to hold utensil in right hand for self feeding with modified independence.     Baseline  unable to hold utensil at eval    Time  12    Period  Weeks    Status  On-going      OT LONG TERM GOAL #2   Title  Pt will increase hand strength in right by 5# to be able to cut food with modified independence.    Baseline  unable at eval     Time  12    Period  Weeks    Status  On-going      OT LONG TERM GOAL #3   Title  Patient will demonstrate increased awareness and strategies to attend to right side to hold items without dropping items.    Baseline  drops items frequently    Time  12     Period  Weeks    Status  On-going      OT LONG TERM GOAL #4   Title  Patient will demonstrate independence in home exercise program for ROM, strength and coordination of right hand.    Baseline  no current program    Time  6    Period  Weeks    Status  On-going      OT LONG TERM GOAL #5   Title  Patient will demonstrate the ability to obtain items from right pocket 90% of the time.    Baseline  difficulty getting keys or coins out of right pocket    Time  12    Period  Weeks    Status  On-going            Plan - 09/14/17 1657    Clinical Impression Statement  Patient seen for focus on facilitation of 2 point  and 3 point prehension patterns to pick up and manipulate items of varying sizes and shapes, the most difficulty with round objects due to decreased control.  Patient and wife educated on types of objects to try at home for home program to continue to work towards changing and switching patterns based on object and prehension demands as well as incorporation of graded pressure to control and hold object appropriately.      Occupational Profile and client history currently impacting functional performance  repeated falls, TBI, expressive aphasia, limited motion in right hand, lack of coordination from previous CVA    Occupational performance deficits (Please refer to evaluation for details):  ADL's;IADL's;Leisure    Rehab Potential  Good    Current Impairments/barriers affecting progress:  expressive aphasia, falls, neglect    OT Frequency  2x / week    OT Duration  12 weeks    Consulted and Agree with Plan of Care  Patient    Family Member Consulted  wife, [name]       Patient will benefit from skilled therapeutic intervention in order to improve the following deficits and impairments:  Impaired vision/preception, Decreased coordination, Decreased safety awareness, Impaired UE functional use, Decreased strength  Visit Diagnosis: Muscle weakness (generalized)  Other lack  of coordination  Neurologic neglect syndrome    Problem List Patient Active Problem List   Diagnosis Date Noted  . Sleep  disturbance   . PAF (paroxysmal atrial fibrillation) ([place])   . Recurrent UTI   . Hypokalemia   . Hyponatremia   . Abnormal urine   . 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 Benign essential HTN   . Urinary retention   . History of gout   . Global aphasia   . SAH (subarachnoid hemorrhage) ([place])   . SDH (subdural hematoma) ([place])   . Lymphocytosis   . Subdural hemorrhage following injury ([place]) 05/12/2016   Fender Herder T Johntavious Francom, OTR/L, CLT  Matalie Romberger 09/14/2017, 5:00 PM  [place] MAIN [facility] SERVICES [place], [place], [address] Phone: [phone]   Fax:  [phone]  Name: James [name] MRN: [id number] Date of Birth: [birth year]

## 2017-09-15 ENCOUNTER — Ambulatory Visit: Payer: PPO | Admitting: Occupational Therapy

## 2017-09-15 DIAGNOSIS — R414 Neurologic neglect syndrome: Secondary | ICD-10-CM

## 2017-09-15 DIAGNOSIS — M6281 Muscle weakness (generalized): Secondary | ICD-10-CM

## 2017-09-15 DIAGNOSIS — R278 Other lack of coordination: Secondary | ICD-10-CM

## 2017-09-17 ENCOUNTER — Encounter: Payer: Self-pay | Admitting: Occupational Therapy

## 2017-09-17 NOTE — Therapy (Signed)
[place] MAIN [facility] SERVICES [address] [place], [place], [address] Phone: [phone]   Fax:  [phone]  Occupational Therapy Treatment  Patient Details  Name: James [name] MRN: [id number] Date of Birth: February 16, 1941 Referring Provider: Manuella Ghazi   Encounter Date: 09/15/2017  OT End of Session - 09/17/17 2114    Visit Number  18    Number of Visits  24    Date for OT Re-Evaluation  09/29/17    Authorization Type  Medicare    OT Start Time  0930    OT Stop Time  1015    OT Time Calculation (min)  45 min    Activity Tolerance  Patient tolerated treatment well    Behavior During Therapy  [facility] for tasks assessed/performed       Past Medical History:  Diagnosis Date  . Alcohol abuse, in remission   . Atrial fibrillation ([place])   . Bilateral renal masses   . Closed right ankle fracture   . 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 Gait disorder   . Gout   . Hypertension   . Seizure disorder ([place])   . 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     Past Surgical History:  Procedure Laterality Date  . CRANIECTOMY FOR DEPRESSED SKULL FRACTURE  2009   with MRSA infection  . CRANIOTOMY     X 2  . HERNIA REPAIR     during childhood  . ORIF ANKLE FRACTURE Left 2012    There were no vitals filed for this visit.  Subjective Assessment - 09/17/17 2112    Subjective   Patient indicating he only has one appointment for next week and would like to add the 2nd treatment session.  Wife reports they are going to a [place] concert on Saturday and patient is looking forwards to it.     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  He suffered another fall in 2017.  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     Patient Stated Goals  to be able to use the  right hand functionally    Currently in Pain?  No/denies    Multiple Pain Sites  No                   OT Treatments/Exercises (OP) - 09/17/17 2113      Neurological Re-education Exercises   Other Exercises 1  Patient seen this date for continued focus on prehension patterns on the right to pick up a variety of balls, golf ball sized, some weighted, some not, others with texture and other smooth.  Some ball utilized made from foam.  Cues for patient to adjust pressure at times depending on the size , weight or consistency of the ball being used.  Therapist occasionally required to place fingers on balls using guiding techniques. Picking up and manipulation of [place] discs, difficulty noted with turning of discs to opposite color side, cues for placement of fingers on disc and use of index finger to manipulate. Switched to a 2 point prehension pattern to pick up objects, patient demonstrated difficulty with consistency of use of 2 point pattern.  Additional focus on use of graded pressure for picking up and placing a variety of items.  OT Education - 09/17/17 2114    Education provided  Yes    Education Details  HEP, prehension patterns    Person(s) Educated  Patient    Methods  Explanation;Demonstration;Verbal cues    Comprehension  Verbal cues required;Returned demonstration;Verbalized understanding          OT Long Term Goals - 08/12/17 1716      OT LONG TERM GOAL #1   Title  Patient will improve right hand function to hold utensil in right hand for self feeding with modified independence.     Baseline  unable to hold utensil at eval    Time  12    Period  Weeks    Status  On-going      OT LONG TERM GOAL #2   Title  Pt will increase hand strength in right by 5# to be able to cut food with modified independence.    Baseline  unable at eval     Time  12    Period  Weeks    Status  On-going      OT LONG TERM GOAL #3   Title  Patient will  demonstrate increased awareness and strategies to attend to right side to hold items without dropping items.    Baseline  drops items frequently    Time  12    Period  Weeks    Status  On-going      OT LONG TERM GOAL #4   Title  Patient will demonstrate independence in home exercise program for ROM, strength and coordination of right hand.    Baseline  no current program    Time  6    Period  Weeks    Status  On-going      OT LONG TERM GOAL #5   Title  Patient will demonstrate the ability to obtain items from right pocket 90% of the time.    Baseline  difficulty getting keys or coins out of right pocket    Time  12    Period  Weeks    Status  On-going            Plan - 09/17/17 2114    Clinical Impression Statement  Patient continues to progress with right hand, continues to have some limitations with range of motion of right thumb especially for thumb extension and ABD.  Graded pressure improved especially with increased focus on task.  Patient has difficulty with consistency of movement when switching from a 2 point to 3 point prehension pattern.  Continue to work on goals to improve right UE functional use for necessary daily tasks.     Occupational Profile and client history currently impacting functional performance  repeated falls, TBI, expressive aphasia, limited motion in right hand, lack of coordination from previous CVA    Occupational performance deficits (Please refer to evaluation for details):  ADL's;IADL's;Leisure    Rehab Potential  Good    Current Impairments/barriers affecting progress:  expressive aphasia, falls, neglect    OT Frequency  2x / week    OT Duration  12 weeks    OT Treatment/Interventions  Self-care/ADL training;Visual/perceptual remediation/compensation;DME and/or AE instruction;Patient/family education;Moist Heat;Therapeutic exercise;Manual Therapy;Therapeutic activities;Neuromuscular education    Consulted and Agree with Plan of Care  Patient        Patient will benefit from skilled therapeutic intervention in order to improve the following deficits and impairments:  Impaired vision/preception, Decreased coordination, Decreased safety awareness, Impaired UE functional use, Decreased strength  Visit Diagnosis:  Muscle weakness (generalized)  Other lack of coordination  Neurologic neglect syndrome    Problem List Patient Active Problem List   Diagnosis Date Noted  . Sleep disturbance   . PAF (paroxysmal atrial fibrillation) ([place])   . Recurrent UTI   . Hypokalemia   . Hyponatremia   . Abnormal urine   . 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 Benign essential HTN   . Urinary retention   . History of gout   . Global aphasia   . SAH (subarachnoid hemorrhage) ([place])   . SDH (subdural hematoma) ([place])   . Lymphocytosis   . Subdural hemorrhage following injury ([place]) 05/12/2016   Nainika Newlun T Rosealyn Little, OTR/L, CLT  Ireanna Finlayson 09/17/2017, 9:15 PM  [place] MAIN [facility] SERVICES [address] [place], [place], [address] Phone: [phone]   Fax:  [phone]  Name: James [name] MRN: [id number] Date of Birth: 10-23-1940

## 2017-09-18 ENCOUNTER — Ambulatory Visit: Payer: PPO | Admitting: Occupational Therapy

## 2017-09-18 DIAGNOSIS — M6281 Muscle weakness (generalized): Secondary | ICD-10-CM | POA: Diagnosis not present

## 2017-09-18 DIAGNOSIS — R414 Neurologic neglect syndrome: Secondary | ICD-10-CM

## 2017-09-18 DIAGNOSIS — R278 Other lack of coordination: Secondary | ICD-10-CM

## 2017-09-20 ENCOUNTER — Ambulatory Visit: Payer: PPO | Admitting: Occupational Therapy

## 2017-09-20 ENCOUNTER — Encounter: Payer: Self-pay | Admitting: Occupational Therapy

## 2017-09-20 DIAGNOSIS — M6281 Muscle weakness (generalized): Secondary | ICD-10-CM

## 2017-09-20 DIAGNOSIS — R278 Other lack of coordination: Secondary | ICD-10-CM

## 2017-09-20 DIAGNOSIS — R414 Neurologic neglect syndrome: Secondary | ICD-10-CM

## 2017-09-21 ENCOUNTER — Encounter: Payer: Self-pay | Admitting: Occupational Therapy

## 2017-09-21 NOTE — Therapy (Signed)
[place] MAIN [facility] SERVICES [address] [place], [place], [address] Phone: [phone]   Fax:  [phone]  Occupational Therapy Treatment  Patient Details  Name: James [name] MRN: [id number] Date of Birth: 1941-03-13 Referring Provider: Manuella Ghazi   Encounter Date: 09/20/2017  OT End of Session - 09/21/17 1837    Visit Number  20    Number of Visits  24    Date for OT Re-Evaluation  09/29/17    Authorization Type  Medicare    OT Start Time  1200    OT Stop Time  1246    OT Time Calculation (min)  46 min    Activity Tolerance  Patient tolerated treatment well    Behavior During Therapy  [facility] for tasks assessed/performed       Past Medical History:  Diagnosis Date  . Alcohol abuse, in remission   . Atrial fibrillation ([place])   . Bilateral renal masses   . Closed right ankle fracture   . 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 Gait disorder   . Gout   . Hypertension   . Seizure disorder ([place])   . 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     Past Surgical History:  Procedure Laterality Date  . CRANIECTOMY FOR DEPRESSED SKULL FRACTURE  2009   with MRSA infection  . CRANIOTOMY     X 2  . HERNIA REPAIR     during childhood  . ORIF ANKLE FRACTURE Left 2012    There were no vitals filed for this visit.  Subjective Assessment - 09/21/17 1836    Subjective   Wife reports she forgot to take patients built up foam for utensil when they went out to eat and patient did really well.  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  He suffered another fall in 2017.  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     Patient Stated Goals  to be able to use the right hand functionally    Currently in Pain?  No/denies    Multiple Pain Sites  No                   OT Treatments/Exercises (OP) - 09/21/17 1912      Neurological Re-education Exercises   Other Exercises 1  Patient seen for ROM and stretching with right thumb ABD and extension stretch, therapist assist and guiding.        Fine Motor Coordination (Hand/Wrist)   Other Fine Motor Exercises  Patient seen for manipulation of large pegs to place into board with cues to not tuck thumb in hand, use thumb for 3 point pinch prehension pattern to pick up and place.  Patient also seen for manipulation of nuts and bolts with right hand with cues for performance of thumb finger combinations.              OT Education - 09/21/17 1837    Education provided  Yes    Education Details  HEP, graded pressure, functional hand use    Person(s) Educated  Patient    Methods  Explanation;Demonstration;Verbal cues    Comprehension  Verbalized understanding;Returned demonstration;Verbal cues required          OT Long Term Goals - 09/21/17 1838      OT LONG TERM GOAL #1   Title  Patient will improve right hand function to hold utensil in right hand for self feeding with modified independence.     Baseline  unable to hold utensil at eval    Time  12    Period  Weeks    Status  On-going      OT LONG TERM GOAL #2   Title  Pt will increase hand strength in right by 5# to be able to cut food with modified independence.    Baseline  unable at eval     Time  12    Period  Weeks    Status  On-going      OT LONG TERM GOAL #3   Title  Patient will demonstrate increased awareness and strategies to attend to right side to hold items without dropping items.    Baseline  drops items frequently    Time  12    Period  Weeks    Status  On-going      OT LONG TERM GOAL #4   Title  Patient will demonstrate independence in  home exercise program for ROM, strength and coordination of right hand.    Baseline  no current program    Time  6    Period  Weeks    Status  On-going      OT LONG TERM GOAL #5   Title  Patient will demonstrate the ability to obtain items from right pocket 90% of the time.    Baseline  difficulty getting keys or coins out of right pocket    Time  12    Period  Weeks    Status  On-going            Plan - 09/21/17 1838    Clinical Impression Statement  Patient continues to progress with right hand for functional tasks, when focused he is able to regulate graded pressure to pick up and manipulate items, when distracted he still has some difficulties but often self corrects.  His wife has seen improvements with his ability to utilize utensils when they are out to eat and able to manage more complex foods such as spaghetti noodles and soup without spilling.     Occupational Profile and client history currently impacting functional performance  repeated falls, TBI, expressive aphasia, limited motion in right hand, lack of coordination from previous CVA    Occupational performance deficits (Please refer to evaluation for details):  ADL's;IADL's;Leisure    Rehab Potential  Good    Current Impairments/barriers affecting progress:  expressive aphasia, falls, neglect    OT Frequency  2x / week    OT Duration  12 weeks    OT Treatment/Interventions  Self-care/ADL training;Visual/perceptual remediation/compensation;DME and/or AE instruction;Patient/family education;Moist Heat;Therapeutic exercise;Manual Therapy;Therapeutic activities;Neuromuscular education    Consulted and Agree with Plan of Care  Patient       Patient will benefit from skilled therapeutic intervention in order to improve the following deficits and impairments:  Impaired vision/preception, Decreased coordination, Decreased safety awareness, Impaired UE functional use, Decreased strength  Visit Diagnosis: Muscle weakness  (generalized)  Other lack of coordination  Neurologic neglect syndrome    Problem List Patient Active Problem List   Diagnosis Date Noted  . Sleep disturbance   . PAF (  paroxysmal atrial fibrillation) ([place])   . Recurrent UTI   . Hypokalemia   . Hyponatremia   . Abnormal urine   . 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 Benign essential HTN   . Urinary retention   . History of gout   . Global aphasia   . SAH (subarachnoid hemorrhage) ([place])   . SDH (subdural hematoma) ([place])   . Lymphocytosis   . Subdural hemorrhage following injury [facility]) 05/12/2016   Amy T Lovett, OTR/L, CLT  Lovett,Amy 09/21/2017, 7:15 PM  [place] MAIN [facility] SERVICES [address] [place], [place], [address] Phone: [phone]   Fax:  [phone]  Name: James [name] MRN: [id number] Date of Birth: [birth year]

## 2017-09-21 NOTE — Therapy (Signed)
[place] MAIN [facility] SERVICES [address] [place], [place], [address] Phone: [phone]   Fax:  [phone]  Occupational Therapy Treatment  Patient Details  Name: James [name] MRN: [id number] Date of Birth: 05/14/1941 Referring Provider: Manuella Ghazi   Encounter Date: 09/18/2017  OT End of Session - 09/20/17 1652    Visit Number  19    Number of Visits  24    Date for OT Re-Evaluation  09/29/17    Authorization Type  Medicare    OT Start Time  1400    OT Stop Time  1445    OT Time Calculation (min)  45 min    Activity Tolerance  Patient tolerated treatment well    Behavior During Therapy  [facility] for tasks assessed/performed       Past Medical History:  Diagnosis Date  . Alcohol abuse, in remission   . Atrial fibrillation ([place])   . Bilateral renal masses   . Closed right ankle fracture   . 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 Gait disorder   . Gout   . Hypertension   . Seizure disorder ([place])   . 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     Past Surgical History:  Procedure Laterality Date  . CRANIECTOMY FOR DEPRESSED SKULL FRACTURE  2009   with MRSA infection  . CRANIOTOMY     X 2  . HERNIA REPAIR     during childhood  . ORIF ANKLE FRACTURE Left 2012    There were no vitals filed for this visit.  Subjective Assessment - 09/20/17 1651    Subjective   Difficult to understand patient but he was able to indicate he wanted to know if he would have me as a therapist next session and when he was coming again.     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  He suffered another fall in 2017.  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     Patient Stated Goals  to be able to use the right hand functionally    Currently in Pain?   No/denies    Multiple Pain Sites  No                   OT Treatments/Exercises (OP) - 09/21/17 1409      ADLs   ADL Comments  Patient seen for Hand writing skills with use of a variety of pen grips, cues and adjustment of fingers on pen, patient writing name in print with cues and guiding from therapist.        Neurological Re-education Exercises   Other Exercises 1  Patient seen for right hand strengthening and coordination with resistive putty with grip, pinch lateral and 3 point pinch and making small balls using both hands.  Patient seen for active ROM of thumb for ABD and extension with guiding and cues from therapist, Opposition of thumb to digits with cues for tip to tip.  Graded pressure activities with foam ball, one hard squeeze followed by one easy squeeze keeping the ball form, cues for regulation of pressure.              OT Education - 09/20/17 1651    Education provided  Yes    Education Details  graded pressure activities, handwriting skills, using hand for storage    Person(s) Educated  Patient    Methods  Explanation;Demonstration;Verbal cues    Comprehension  Verbal cues required;Returned demonstration;Verbalized understanding          OT Long Term Goals - 08/12/17 1716      OT LONG TERM GOAL #1   Title  Patient will improve right hand function to hold utensil in right hand for self feeding with modified independence.     Baseline  unable to hold utensil at eval    Time  12    Period  Weeks    Status  On-going      OT LONG TERM GOAL #2   Title  Pt will increase hand strength in right by 5# to be able to cut food with modified independence.    Baseline  unable at eval     Time  12    Period  Weeks    Status  On-going      OT LONG TERM GOAL #3   Title  Patient will demonstrate increased awareness and strategies to attend to right side to hold items without dropping items.    Baseline  drops items frequently    Time  12    Period  Weeks     Status  On-going      OT LONG TERM GOAL #4   Title  Patient will demonstrate independence in home exercise program for ROM, strength and coordination of right hand.    Baseline  no current program    Time  6    Period  Weeks    Status  On-going      OT LONG TERM GOAL #5   Title  Patient will demonstrate the ability to obtain items from right pocket 90% of the time.    Baseline  difficulty getting keys or coins out of right pocket    Time  12    Period  Weeks    Status  On-going            Plan - 09/20/17 1652    Clinical Impression Statement  Patient working towards pen grasp to be able to write name with greater legibility, cues and guiding from therapist.  Difficulty incorporating middle finger into task for resting pen on finger.  Patient requires cues for types of pinch and prehension patterns, difficulty to get one method consistently and when he does, it is hard to change the pattern.  Patient appears to talk more during session but is still difficult to understand due to expressive aphasia.  When therapist asking, "Who am I seeing today?"  Patient was able to respond, "Loretha Stapler."    Occupational Profile and client history currently impacting functional performance  repeated falls, TBI, expressive aphasia, limited motion in right hand, lack of coordination from previous CVA    Occupational performance deficits (Please refer to evaluation for details):  ADL's;IADL's;Leisure    Rehab Potential  Good    Current Impairments/barriers affecting progress:  expressive aphasia, falls, neglect    OT Frequency  2x / week    OT Duration  12 weeks    OT Treatment/Interventions  Self-care/ADL training;Visual/perceptual remediation/compensation;DME and/or AE instruction;Patient/family education;Moist Heat;Therapeutic exercise;Manual Therapy;Therapeutic activities;Neuromuscular education    Consulted and Agree with Plan of Care  Patient       Patient will benefit from skilled  therapeutic intervention in order to improve the following deficits and impairments:  Impaired vision/preception, Decreased coordination, Decreased  safety awareness, Impaired UE functional use, Decreased strength  Visit Diagnosis: Muscle weakness (generalized)  Other lack of coordination  Neurologic neglect syndrome    Problem List Patient Active Problem List   Diagnosis Date Noted  . Sleep disturbance   . PAF (paroxysmal atrial fibrillation) ([place])   . Recurrent UTI   . Hypokalemia   . Hyponatremia   . Abnormal urine   . 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 Benign essential HTN   . Urinary retention   . History of gout   . Global aphasia   . SAH (subarachnoid hemorrhage) ([place])   . SDH (subdural hematoma) ([place])   . Lymphocytosis   . Subdural hemorrhage following injury ([employer]) 05/12/2016   Jakaree Pickard T Cambria Osten, OTR/L, CLT  Kamill Fulbright 09/21/2017, 2:18 PM  [place] MAIN [facility] SERVICES [address] [place], [place], [address] Phone: [phone]   Fax:  [phone]  Name: James [name] MRN: [id number] Date of Birth: 15-Nov-1940

## 2017-09-25 ENCOUNTER — Encounter: Payer: Self-pay | Admitting: Occupational Therapy

## 2017-09-25 ENCOUNTER — Ambulatory Visit: Payer: PPO | Attending: Neurology | Admitting: Occupational Therapy

## 2017-09-25 DIAGNOSIS — M6281 Muscle weakness (generalized): Secondary | ICD-10-CM | POA: Insufficient documentation

## 2017-09-25 DIAGNOSIS — R278 Other lack of coordination: Secondary | ICD-10-CM | POA: Insufficient documentation

## 2017-09-25 DIAGNOSIS — R414 Neurologic neglect syndrome: Secondary | ICD-10-CM | POA: Diagnosis not present

## 2017-09-27 ENCOUNTER — Ambulatory Visit: Payer: PPO | Admitting: Occupational Therapy

## 2017-09-27 DIAGNOSIS — M6281 Muscle weakness (generalized): Secondary | ICD-10-CM | POA: Diagnosis not present

## 2017-09-27 DIAGNOSIS — R414 Neurologic neglect syndrome: Secondary | ICD-10-CM

## 2017-09-27 DIAGNOSIS — R278 Other lack of coordination: Secondary | ICD-10-CM

## 2017-10-02 ENCOUNTER — Ambulatory Visit: Payer: PPO | Admitting: Occupational Therapy

## 2017-10-02 DIAGNOSIS — M6281 Muscle weakness (generalized): Secondary | ICD-10-CM

## 2017-10-02 DIAGNOSIS — R414 Neurologic neglect syndrome: Secondary | ICD-10-CM

## 2017-10-02 DIAGNOSIS — R278 Other lack of coordination: Secondary | ICD-10-CM

## 2017-10-03 ENCOUNTER — Encounter: Payer: Self-pay | Admitting: Occupational Therapy

## 2017-10-03 NOTE — Therapy (Signed)
[place] MAIN [facility] SERVICES [address] [place], [place], [address] Phone: [phone]   Fax:  [phone]  Occupational Therapy Treatment  Patient Details  Name: James [name] MRN: [id number] Date of Birth: 08/31/1940 Referring Provider: Manuella Ghazi   Encounter Date: 09/25/2017  OT End of Session - 10/02/17 1924    Visit Number  21    Number of Visits  24    Date for OT Re-Evaluation  09/29/17    Authorization Type  Medicare    OT Start Time  1145    OT Stop Time  1230    OT Time Calculation (min)  45 min    Activity Tolerance  Patient tolerated treatment well    Behavior During Therapy  [facility] for tasks assessed/performed       Past Medical History:  Diagnosis Date  . Alcohol abuse, in remission   . Atrial fibrillation ([place])   . Bilateral renal masses   . Closed right ankle fracture   . 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 Gait disorder   . Gout   . Hypertension   . Seizure disorder ([place])   . 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     Past Surgical History:  Procedure Laterality Date  . CRANIECTOMY FOR DEPRESSED SKULL FRACTURE  2009   with MRSA infection  . CRANIOTOMY     X 2  . HERNIA REPAIR     during childhood  . ORIF ANKLE FRACTURE Left 2012    There were no vitals filed for this visit.  Subjective Assessment - 10/02/17 1918    Subjective   Patient indicating he had a nice weekend, no pain.      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  He suffered another fall in 2017.  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     Patient Stated Goals  to be able to use the right hand functionally    Currently in Pain?  No/denies    Multiple Pain Sites  No                   OT Treatments/Exercises (OP) - 10/02/17  1919      ADLs   ADL Comments  Patient seen for right tripod grasp on pen and writing name.  He was able to come into the clinic and write therapists name without any cues or prompts.       Neurological Re-education Exercises   Other Exercises 1  Patient seen for RUE graded pressure activities with use of styrofoam cups, regular and also slotted, with and without liquid and use of water bottle full and 1/2 empty.  Patient requires occasional cues for amount of pressure when grasping, lifting and moving objects.  Advanced to tasks which requires alternating levels of pressure, light and heavy.  Patient working towards prehension patterns for reaching and picking up resistive velcro squares with cues for thumb and index prehension use. Patient seen for graded pinch skills with varied resistance.        Fine Motor Coordination (Hand/Wrist)   Other Fine Motor Exercises  Patient seen for manipulation of large pegs to place into board with cues to not tuck thumb in hand, use thumb for 3 point pinch prehension pattern to  pick up and place.  Patient also seen for manipulation of nuts and bolts with right hand with cues for performance of thumb finger combinations.              OT Education - 10/02/17 1924    Education provided  Yes          OT Long Term Goals - 09/21/17 1838      OT LONG TERM GOAL #1   Title  Patient will improve right hand function to hold utensil in right hand for self feeding with modified independence.     Baseline  unable to hold utensil at eval    Time  12    Period  Weeks    Status  On-going      OT LONG TERM GOAL #2   Title  Pt will increase hand strength in right by 5# to be able to cut food with modified independence.    Baseline  unable at eval     Time  12    Period  Weeks    Status  On-going      OT LONG TERM GOAL #3   Title  Patient will demonstrate increased awareness and strategies to attend to right side to hold items without dropping items.     Baseline  drops items frequently    Time  12    Period  Weeks    Status  On-going      OT LONG TERM GOAL #4   Title  Patient will demonstrate independence in home exercise program for ROM, strength and coordination of right hand.    Baseline  no current program    Time  6    Period  Weeks    Status  On-going      OT LONG TERM GOAL #5   Title  Patient will demonstrate the ability to obtain items from right pocket 90% of the time.    Baseline  difficulty getting keys or coins out of right pocket    Time  12    Period  Weeks    Status  On-going            Plan - 10/02/17 1924    Clinical Impression Statement  Patient has continued to make good progress with right hand with picking up and manipulating objects with improved level of appropriate pressure.  He occasionally requires cues to adjust depending on the object.  He continues to require cues to initiate use at times of right hand and often starts to reach and use left hand.  Continue to work on right hand function to improve daily tasks.     Occupational Profile and client history currently impacting functional performance  repeated falls, TBI, expressive aphasia, limited motion in right hand, lack of coordination from previous CVA    Occupational performance deficits (Please refer to evaluation for details):  ADL's;IADL's;Leisure    Rehab Potential  Good    Current Impairments/barriers affecting progress:  expressive aphasia, falls, neglect    OT Frequency  2x / week    OT Duration  12 weeks    OT Treatment/Interventions  Self-care/ADL training;Visual/perceptual remediation/compensation;DME and/or AE instruction;Patient/family education;Moist Heat;Therapeutic exercise;Manual Therapy;Therapeutic activities;Neuromuscular education    Consulted and Agree with Plan of Care  Patient       Patient will benefit from skilled therapeutic intervention in order to improve the following deficits and impairments:  Impaired  vision/preception, Decreased coordination, Decreased safety awareness, Impaired UE functional use, Decreased strength  Visit Diagnosis: Muscle weakness (generalized)  Other lack of coordination  Neurologic neglect syndrome    Problem List Patient Active Problem List   Diagnosis Date Noted  . Sleep disturbance   . PAF (paroxysmal atrial fibrillation) ([place])   . Recurrent UTI   . Hypokalemia   . Hyponatremia   . Abnormal urine   . 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 Benign essential HTN   . Urinary retention   . History of gout   . Global aphasia   . SAH (subarachnoid hemorrhage) ([place])   . SDH (subdural hematoma) ([place])   . Lymphocytosis   . Subdural hemorrhage following injury [facility]) 05/12/2016   Amy T Lovett, OTR/L, CLT  Lovett,Amy 10/03/2017, 7:27 PM  [place] MAIN [facility] SERVICES [address] [place], [place], [address] Phone: [phone]   Fax:  [phone]  Name: Tia Gelb MRN: [id number] Date of Birth: 13-Sep-1940

## 2017-10-04 ENCOUNTER — Encounter: Payer: Self-pay | Admitting: Occupational Therapy

## 2017-10-04 ENCOUNTER — Ambulatory Visit: Payer: PPO | Admitting: Occupational Therapy

## 2017-10-04 NOTE — Addendum Note (Signed)
Addended by: Garlon Hatchet T on: 10/04/2017 06:03 PM   Modules accepted: Orders

## 2017-10-04 NOTE — Therapy (Signed)
[place] MAIN [facility] SERVICES [address] [place], [place], [address] Phone: [phone]   Fax:  [phone]  Occupational Therapy Treatment  Patient Details  Name: Juandedios Dudash MRN: [id number] Date of Birth: [birth year] Referring Provider: Manuella Ghazi   Encounter Date: 10/02/2017  OT End of Session - 10/04/17 1921    Visit Number  23    Number of Visits  48    Date for OT Re-Evaluation  12/22/17    Authorization Type  Medicare    OT Start Time  1100    OT Stop Time  1147    OT Time Calculation (min)  47 min    Activity Tolerance  Patient tolerated treatment well    Behavior During Therapy  [facility] for tasks assessed/performed       Past Medical History:  Diagnosis Date  . Alcohol abuse, in remission   . Atrial fibrillation ([place])   . Bilateral renal masses   . Closed right ankle fracture   . 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 Gait disorder   . Gout   . Hypertension   . Seizure disorder ([place])   . 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     Past Surgical History:  Procedure Laterality Date  . CRANIECTOMY FOR DEPRESSED SKULL FRACTURE  2009   with MRSA infection  . CRANIOTOMY     X 2  . HERNIA REPAIR     during childhood  . ORIF ANKLE FRACTURE Left 2012    There were no vitals filed for this visit.  Subjective Assessment - 10/04/17 1919    Subjective   Patient's wife reports they went out to dinner for their anniversary over the weekend and the patient was able to cut his [place] strip steak by himself for the first time since his CVA.  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  He suffered another fall in 2017.  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     Patient Stated Goals  to be able to use the right hand functionally    Currently in Pain?  No/denies    Multiple Pain Sites  No                   OT Treatments/Exercises (OP) - 10/04/17 1924      ADLs   ADL Comments  Patient seen for handwriting skills, cues for pen grasp.  Patient able to write his name first and last and the first name of therapist.  Patient also working towards looking at a menu and attempting to order items, unable to get the words out.       Neurological Re-education Exercises   Other Exercises 1  Patient seen for resistive pinch skills to pick up and place on elevated surface with right hand, cues for alternating colors for different levels of resistance and type of pinch and level of graded pressure to be used.     Other Exercises 2  Patient manipulating small sticks to pick up from table with minimal difficulty and attempting to place into grid, increased difficulty to turn items to a vertical position.  If items are  handed to him he can place and he can also remove from board with minimal cues.              OT Education - 10/04/17 1921    Education provided  Yes    Education Details  right hand graded pressure, manipulation of objects    Person(s) Educated  Patient;Spouse    Methods  Explanation;Demonstration;Verbal cues    Comprehension  Verbal cues required;Returned demonstration;Verbalized understanding          OT Long Term Goals - 10/03/17 2129      OT LONG TERM GOAL #1   Title  Patient will improve right hand function to hold utensil in right hand for self feeding with modified independence.     Baseline  unable to hold utensil at eval    Time  12    Period  Weeks    Status  On-going      OT LONG TERM GOAL #2   Title  Pt will increase hand strength in right by 5# to be able to cut food with modified independence.    Baseline  unable at eval     Time  12    Period  Weeks    Status  On-going      OT  LONG TERM GOAL #3   Title  Patient will demonstrate increased awareness and strategies to attend to right side to hold items without dropping items.    Baseline  drops items frequently    Time  12    Period  Weeks    Status  On-going      OT LONG TERM GOAL #4   Title  Patient will demonstrate independence in home exercise program for ROM, strength and coordination of right hand.    Baseline  no current program    Time  6    Period  Weeks    Status  On-going      OT LONG TERM GOAL #5   Title  Patient will demonstrate the ability to obtain items from right pocket 90% of the time.    Baseline  difficulty getting keys or coins out of right pocket    Time  12    Period  Weeks    Status  On-going            Plan - 10/04/17 1922    Clinical Impression Statement  Patient demonstrates difficulty at times indentifying colors of resistive pinch pins, cues for alternating levels of resistance based on the color of the pin with use of right hand.  Patient has some difficulty when moving from a high resistance to light resistance and requires cues to adjust pressure. Patient has difficulty with picking up small sticks from table and turning them to place into grid, if therapists hands item to him he can complete and he can also remove from grid with cues.     Occupational Profile and client history currently impacting functional performance  repeated falls, TBI, expressive aphasia, limited motion in right hand, lack of coordination from previous CVA    Occupational performance deficits (Please refer to evaluation for details):  ADL's;IADL's;Leisure    Rehab Potential  Good    Current Impairments/barriers affecting progress:  expressive aphasia, falls, neglect    OT Frequency  2x / week    OT Duration  12 weeks    OT Treatment/Interventions  Self-care/ADL training;Visual/perceptual remediation/compensation;DME and/or AE instruction;Patient/family education;Moist Heat;Therapeutic exercise;Manual  Therapy;Therapeutic activities;Neuromuscular education    Consulted  and Agree with Plan of Care  Patient    Family Member Consulted  wife, [name]       Patient will benefit from skilled therapeutic intervention in order to improve the following deficits and impairments:  Impaired vision/preception, Decreased coordination, Decreased safety awareness, Impaired UE functional use, Decreased strength  Visit Diagnosis: Muscle weakness (generalized)  Other lack of coordination  Neurologic neglect syndrome    Problem List Patient Active Problem List   Diagnosis Date Noted  . Sleep disturbance   . PAF (paroxysmal atrial fibrillation) ([place])   . Recurrent UTI   . Hypokalemia   . Hyponatremia   . Abnormal urine   . 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 Benign essential HTN   . Urinary retention   . History of gout   . Global aphasia   . SAH (subarachnoid hemorrhage) ([place])   . SDH (subdural hematoma) ([place])   . Lymphocytosis   . Subdural hemorrhage following injury [facility]) 05/12/2016   Tya Haughey T Ania Levay, OTR/L, CLT  Breana Litts 10/04/2017, 7:43 PM  [place] MAIN [facility] SERVICES [address] [place], [place], [address] Phone: [phone]   Fax:  [phone]  Name: Dock Baccam MRN: [id number] Date of Birth: [birth year]

## 2017-10-04 NOTE — Therapy (Signed)
[place] MAIN [facility] SERVICES [address] [place], [place], [address] Phone: [phone]   Fax:  [phone]  Occupational Therapy Treatment/REcertification  Patient Details  Name: James [name] MRN: [id number] Date of Birth: September 21, 1940 Referring Provider: Manuella Ghazi   Encounter Date: 09/27/2017  OT End of Session - 10/03/17 2127    Visit Number  22    Number of Visits  48    Date for OT Re-Evaluation  12/22/17    OT Start Time  1145    OT Stop Time  1230    OT Time Calculation (min)  45 min    Activity Tolerance  Patient tolerated treatment well    Behavior During Therapy  [facility] for tasks assessed/performed       Past Medical History:  Diagnosis Date  . Alcohol abuse, in remission   . Atrial fibrillation ([place])   . Bilateral renal masses   . Closed right ankle fracture   . 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 Gait disorder   . Gout   . Hypertension   . Seizure disorder ([place])   . 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     Past Surgical History:  Procedure Laterality Date  . CRANIECTOMY FOR DEPRESSED SKULL FRACTURE  2009   with MRSA infection  . CRANIOTOMY     X 2  . HERNIA REPAIR     during childhood  . ORIF ANKLE FRACTURE Left 2012    There were no vitals filed for this visit.  Subjective Assessment - 10/03/17 2125    Subjective   Patient trying to tell therapist it is his anniversary with his wife, 8 years.  He was able to say the word anniversary a couple of times with effort. 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  He suffered another fall in 2017.  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     Patient Stated Goals  to be able to use  the right hand functionally    Currently in Pain?  No/denies    Multiple Pain Sites  No                   OT Treatments/Exercises (OP) - 10/04/17 1752      ADLs   ADL Comments  Patient has progressed with feeding self with decreased spillage, improved hand to mouth patterns, able to hold fork now in right hand, able to twist and eat pasta, and was able to cut his steak without assistance one night.  Patient working on consistent performance in these areas. Pen and handwriting improving with legibility and writing name with decreased cues.       Neurological Re-education Exercises   Other Exercises 1  Reassessment of grip right 40#, left 60#.  9 hole peg test patient went from unable to pick up any pegs to now picking up 3 and placing them into board in one minute 43 secs.  He is able to pick up larger items and working towards moving to smaller items.  Patient seen for picking up and placing resistive velcro squares, managing unknotting medium sized nylon rope and picking up items with  varying grip from easy to hard.               OT Education - 10/03/17 2127    Education provided  Yes    Education Details  updated goals, progress, home exercises.     Person(s) Educated  Patient;Spouse    Methods  Explanation;Demonstration;Verbal cues    Comprehension  Verbal cues required;Returned demonstration;Verbalized understanding          OT Long Term Goals - 10/03/17 2129      OT LONG TERM GOAL #1   Title  Patient will improve right hand function to hold utensil in right hand for self feeding with modified independence.     Baseline  unable to hold utensil at eval    Time  12    Period  Weeks    Status  On-going      OT LONG TERM GOAL #2   Title  Pt will increase hand strength in right by 5# to be able to cut food with modified independence.    Baseline  unable at eval     Time  12    Period  Weeks    Status  On-going      OT LONG TERM GOAL #3   Title  Patient  will demonstrate increased awareness and strategies to attend to right side to hold items without dropping items.    Baseline  drops items frequently    Time  12    Period  Weeks    Status  On-going      OT LONG TERM GOAL #4   Title  Patient will demonstrate independence in home exercise program for ROM, strength and coordination of right hand.    Baseline  no current program    Time  6    Period  Weeks    Status  On-going      OT LONG TERM GOAL #5   Title  Patient will demonstrate the ability to obtain items from right pocket 90% of the time.    Baseline  difficulty getting keys or coins out of right pocket    Time  12    Period  Weeks    Status  On-going            Plan - 10/03/17 2128    Clinical Impression Statement  Reassessment this date of skills with improvements noted in right hand grip, pinch, and hand function.  Patient able to regulate graded pressure in right hand with cues and is working on refining grip, pinch and manipulation skills.  Patient has improved with daily tasks during self care as well as in the community with going out to eat.  Wife and patient both pleased with progress and surprised at gains. Patient continues to benefit from skilled OT services and would benefit from continuing to work towards improving right UE hand skills to perform necessary daily tasks.     Occupational Profile and client history currently impacting functional performance  repeated falls, TBI, expressive aphasia, limited motion in right hand, lack of coordination from previous CVA    Occupational performance deficits (Please refer to evaluation for details):  ADL's;IADL's;Leisure    Rehab Potential  Good    Current Impairments/barriers affecting progress:  expressive aphasia, falls, neglect    OT Frequency  2x / week    OT Duration  12 weeks    OT Treatment/Interventions  Self-care/ADL training;Visual/perceptual remediation/compensation;DME and/or AE instruction;Patient/family  education;Moist Heat;Therapeutic exercise;Manual Therapy;Therapeutic activities;Neuromuscular education  Consulted and Agree with Plan of Care  Patient       Patient will benefit from skilled therapeutic intervention in order to improve the following deficits and impairments:  Impaired vision/preception, Decreased coordination, Decreased safety awareness, Impaired UE functional use, Decreased strength  Visit Diagnosis: Muscle weakness (generalized)  Other lack of coordination  Neurologic neglect syndrome    Problem List Patient Active Problem List   Diagnosis Date Noted  . Sleep disturbance   . PAF (paroxysmal atrial fibrillation) ([place])   . Recurrent UTI   . Hypokalemia   . Hyponatremia   . Abnormal urine   . 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 Benign essential HTN   . Urinary retention   . History of gout   . Global aphasia   . SAH (subarachnoid hemorrhage) ([place])   . SDH (subdural hematoma) ([place])   . Lymphocytosis   . Subdural hemorrhage following injury ([place]) 05/12/2016   Amy T Lovett, OTR/L, CLT  Lovett,Amy 10/04/2017, 6:00 PM  [place] MAIN [facility] SERVICES [place], [place], [address] Phone: [phone]   Fax:  [phone]  Name: Ott Zimmerle MRN: [id number] Date of Birth: Oct 02, 1940

## 2017-10-09 ENCOUNTER — Ambulatory Visit: Payer: PPO | Admitting: Occupational Therapy

## 2017-10-09 DIAGNOSIS — R414 Neurologic neglect syndrome: Secondary | ICD-10-CM

## 2017-10-09 DIAGNOSIS — M6281 Muscle weakness (generalized): Secondary | ICD-10-CM | POA: Diagnosis not present

## 2017-10-09 DIAGNOSIS — R278 Other lack of coordination: Secondary | ICD-10-CM

## 2017-10-12 ENCOUNTER — Ambulatory Visit: Payer: PPO | Admitting: Occupational Therapy

## 2017-10-12 DIAGNOSIS — M6281 Muscle weakness (generalized): Secondary | ICD-10-CM | POA: Diagnosis not present

## 2017-10-12 DIAGNOSIS — R414 Neurologic neglect syndrome: Secondary | ICD-10-CM

## 2017-10-12 DIAGNOSIS — R278 Other lack of coordination: Secondary | ICD-10-CM

## 2017-10-16 ENCOUNTER — Ambulatory Visit: Payer: PPO | Admitting: Occupational Therapy

## 2017-10-16 DIAGNOSIS — M6281 Muscle weakness (generalized): Secondary | ICD-10-CM

## 2017-10-16 DIAGNOSIS — R414 Neurologic neglect syndrome: Secondary | ICD-10-CM

## 2017-10-16 DIAGNOSIS — R278 Other lack of coordination: Secondary | ICD-10-CM

## 2017-10-18 ENCOUNTER — Ambulatory Visit: Payer: PPO | Admitting: Occupational Therapy

## 2017-10-18 ENCOUNTER — Encounter: Payer: Self-pay | Admitting: Occupational Therapy

## 2017-10-18 DIAGNOSIS — R414 Neurologic neglect syndrome: Secondary | ICD-10-CM

## 2017-10-18 DIAGNOSIS — R278 Other lack of coordination: Secondary | ICD-10-CM

## 2017-10-18 DIAGNOSIS — M6281 Muscle weakness (generalized): Secondary | ICD-10-CM | POA: Diagnosis not present

## 2017-10-21 ENCOUNTER — Encounter: Payer: Self-pay | Admitting: Occupational Therapy

## 2017-10-21 NOTE — Therapy (Signed)
[place] MAIN [facility] SERVICES [address] [place], [place], [address] Phone: [phone]   Fax:  [phone]  Occupational Therapy Treatment  Patient Details  Name: James [name] MRN: [id number] Date of Birth: October 11, 1940 Referring Provider: Manuella Ghazi   Encounter Date: 10/09/2017  OT End of Session - 10/21/17 1651    Visit Number  24    Number of Visits  48    Date for OT Re-Evaluation  12/22/17    OT Start Time  1345    OT Stop Time  1430    OT Time Calculation (min)  45 min    Activity Tolerance  Patient tolerated treatment well    Behavior During Therapy  [facility] for tasks assessed/performed       Past Medical History:  Diagnosis Date  . Alcohol abuse, in remission   . Atrial fibrillation ([place])   . Bilateral renal masses   . Closed right ankle fracture   . 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 Gait disorder   . Gout   . Hypertension   . Seizure disorder ([place])   . 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     Past Surgical History:  Procedure Laterality Date  . CRANIECTOMY FOR DEPRESSED SKULL FRACTURE  2009   with MRSA infection  . CRANIOTOMY     X 2  . HERNIA REPAIR     during childhood  . ORIF ANKLE FRACTURE Left 2012    There were no vitals filed for this visit.  Subjective Assessment - 10/21/17 1649    Subjective   Patient indicates he still has difficulty at times with managing buttons.     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  He suffered another fall in 2017.  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     Patient Stated Goals  to be able to use the right hand functionally    Currently in Pain?  No/denies    Multiple Pain Sites  No                   OT Treatments/Exercises (OP) - 10/21/17 1649       ADLs   ADL Comments  Patient was seen for continued focus on managing buttons with small white buttons as well as metal silver buttons with cues to utilize both right and left hands. Patient tends to use left hand only to unbuttoned and has difficulty with using both hands to button.  Patient was seen for focus on right hand dominant hand writing for printing name and signing name two important papers. Patient able to demonstrate slightly improved legibility with larger letters. Has difficulty with using small line to sign name on a check.             OT Education - 10/21/17 1650    Education provided  Yes    Education Details  buttons, handwriting    Person(s) Educated  Patient    Methods  Explanation;Demonstration;Verbal cues    Comprehension  Verbal cues required;Returned demonstration;Verbalized understanding          OT Long Term Goals - 10/03/17 2129      OT LONG TERM GOAL #1   Title  Patient will improve right hand function to hold utensil  in right hand for self feeding with modified independence.     Baseline  unable to hold utensil at eval    Time  12    Period  Weeks    Status  On-going      OT LONG TERM GOAL #2   Title  Pt will increase hand strength in right by 5# to be able to cut food with modified independence.    Baseline  unable at eval     Time  12    Period  Weeks    Status  On-going      OT LONG TERM GOAL #3   Title  Patient will demonstrate increased awareness and strategies to attend to right side to hold items without dropping items.    Baseline  drops items frequently    Time  12    Period  Weeks    Status  On-going      OT LONG TERM GOAL #4   Title  Patient will demonstrate independence in home exercise program for ROM, strength and coordination of right hand.    Baseline  no current program    Time  6    Period  Weeks    Status  On-going      OT LONG TERM GOAL #5   Title  Patient will demonstrate the ability to obtain items from right  pocket 90% of the time.    Baseline  difficulty getting keys or coins out of right pocket    Time  12    Period  Weeks    Status  On-going            Plan - 10/21/17 1651    Clinical Impression Statement  Patient continues to make good progress with use of right hand with attempts to incorporate into functional task of managing buttons as well as handwriting skills with emphasis on pen grip, stroke formation and coordination for improved legibility. Patient continues to benefit from occupational therapy to maximize safety and independence and daily tasks.    Occupational Profile and client history currently impacting functional performance  repeated falls, TBI, expressive aphasia, limited motion in right hand, lack of coordination from previous CVA    Occupational performance deficits (Please refer to evaluation for details):  ADL's;IADL's;Leisure    Rehab Potential  Good    Current Impairments/barriers affecting progress:  expressive aphasia, falls, neglect    OT Frequency  2x / week    OT Duration  12 weeks    OT Treatment/Interventions  Self-care/ADL training;Visual/perceptual remediation/compensation;DME and/or AE instruction;Patient/family education;Moist Heat;Therapeutic exercise;Manual Therapy;Therapeutic activities;Neuromuscular education    Consulted and Agree with Plan of Care  Patient    Family Member Consulted  wife, [name]       Patient will benefit from skilled therapeutic intervention in order to improve the following deficits and impairments:  Impaired vision/preception, Decreased coordination, Decreased safety awareness, Impaired UE functional use, Decreased strength  Visit Diagnosis: Muscle weakness (generalized)  Other lack of coordination  Neurologic neglect syndrome    Problem List Patient Active Problem List   Diagnosis Date Noted  . Sleep disturbance   . PAF (paroxysmal atrial fibrillation) ([place])   . Recurrent UTI   . Hypokalemia   . Hyponatremia    . Abnormal urine   . 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 Benign essential HTN   . Urinary retention   . History of gout   . Global aphasia   . SAH (  subarachnoid hemorrhage) ([place])   . SDH (subdural hematoma) ([place])   . Lymphocytosis   . Subdural hemorrhage following injury ([place]) 05/12/2016   Amy T Lovett, OTR/L, CLT  Lovett,Amy 10/21/2017, 4:55 PM  [place] MAIN [facility] SERVICES [address] [place], [place], [address] Phone: [phone]   Fax:  [phone]  Name: Kha Hari MRN: [id number] Date of Birth: [birth year]

## 2017-10-21 NOTE — Therapy (Signed)
[place] MAIN [facility] SERVICES [address] [place], [place], [address] Phone: [phone]   Fax:  [phone]  Occupational Therapy Treatment  Patient Details  Name: James [name] MRN: [id number] Date of Birth: 1940-11-23 Referring Provider: Manuella Ghazi   Encounter Date: 10/18/2017  OT End of Session - 10/21/17 1856    Visit Number  27    Number of Visits  48    Date for OT Re-Evaluation  12/22/17    OT Start Time  1100    OT Stop Time  1146    OT Time Calculation (min)  46 min    Activity Tolerance  Patient tolerated treatment well    Behavior During Therapy  [facility] for tasks assessed/performed       Past Medical History:  Diagnosis Date  . Alcohol abuse, in remission   . Atrial fibrillation ([place])   . Bilateral renal masses   . Closed right ankle fracture   . 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 Gait disorder   . Gout   . Hypertension   . Seizure disorder ([place])   . 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     Past Surgical History:  Procedure Laterality Date  . CRANIECTOMY FOR DEPRESSED SKULL FRACTURE  2009   with MRSA infection  . CRANIOTOMY     X 2  . HERNIA REPAIR     during childhood  . ORIF ANKLE FRACTURE Left 2012    There were no vitals filed for this visit.  Subjective Assessment - 10/21/17 1854    Subjective   Wife reports husband lost his glasses yesterday and has on a spare pair but one of the lenses are missing.  He is planning to go to the eye doctor today and get a new pair. Has a friend in town and they are going to the [place] to eat lunch today.     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  He suffered another fall in 2017.  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     Patient Stated Goals  to be able to use the right hand functionally    Currently in Pain?  No/denies    Multiple Pain Sites  No                   OT Treatments/Exercises (OP) - 10/21/17 1855      Neurological Re-education Exercises   Other Exercises 1  Patient seen for manipulation of grooved pegs with extensive cues for prehension patterns.  Patient tends to want to use chin to readjust piece rather than attempt to manipulate with fingers. Patient provided with cues for adjusting grasp during activity.  Patient seen for attempts at knotting (difficulty at times perceptually), then unknotting with bilateral UE use and cues to utilize right hand and thumb finger prehension patterns             OT Education - 10/21/17 1855    Education provided  Yes    Education Details  grasping, coordination    Person(s) Educated  Patient    Methods  Explanation;Demonstration;Verbal cues    Comprehension  Verbal cues required;Returned demonstration;Verbalized understanding  OT Long Term Goals - 10/03/17 2129      OT LONG TERM GOAL #1   Title  Patient will improve right hand function to hold utensil in right hand for self feeding with modified independence.     Baseline  unable to hold utensil at eval    Time  12    Period  Weeks    Status  On-going      OT LONG TERM GOAL #2   Title  Pt will increase hand strength in right by 5# to be able to cut food with modified independence.    Baseline  unable at eval     Time  12    Period  Weeks    Status  On-going      OT LONG TERM GOAL #3   Title  Patient will demonstrate increased awareness and strategies to attend to right side to hold items without dropping items.    Baseline  drops items frequently    Time  12    Period  Weeks    Status  On-going      OT LONG TERM GOAL #4   Title  Patient will demonstrate independence in home exercise program for ROM, strength and coordination of right hand.     Baseline  no current program    Time  6    Period  Weeks    Status  On-going      OT LONG TERM GOAL #5   Title  Patient will demonstrate the ability to obtain items from right pocket 90% of the time.    Baseline  difficulty getting keys or coins out of right pocket    Time  12    Period  Weeks    Status  On-going            Plan - 10/21/17 1856    Clinical Impression Statement  Patient has a large habit of using his mouth and chin to help turn items rather than using right hand to manipulate items. Moderate and repeated cues to change this pattern, wife alerted to this and discussed cues when she notices this behavior to help eliminate.  Patient continues to work towards progressing to manipulation of smaller objects with right hand and facilitation of isolated finger movements.     Occupational Profile and client history currently impacting functional performance  repeated falls, TBI, expressive aphasia, limited motion in right hand, lack of coordination from previous CVA    Occupational performance deficits (Please refer to evaluation for details):  ADL's;IADL's;Leisure    Rehab Potential  Good    Current Impairments/barriers affecting progress:  expressive aphasia, falls, neglect    OT Frequency  2x / week    OT Duration  12 weeks    OT Treatment/Interventions  Self-care/ADL training;Visual/perceptual remediation/compensation;DME and/or AE instruction;Patient/family education;Moist Heat;Therapeutic exercise;Manual Therapy;Therapeutic activities;Neuromuscular education    Consulted and Agree with Plan of Care  Patient       Patient will benefit from skilled therapeutic intervention in order to improve the following deficits and impairments:  Impaired vision/preception, Decreased coordination, Decreased safety awareness, Impaired UE functional use, Decreased strength  Visit Diagnosis: Muscle weakness (generalized)  Other lack of coordination  Neurologic neglect  syndrome    Problem List Patient Active Problem List   Diagnosis Date Noted  . Sleep disturbance   . PAF (paroxysmal atrial fibrillation) ([place])   . Recurrent UTI   . Hypokalemia   . Hyponatremia   . Abnormal urine   .  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 Benign essential HTN   . Urinary retention   . History of gout   . Global aphasia   . SAH (subarachnoid hemorrhage) ([place])   . SDH (subdural hematoma) ([place])   . Lymphocytosis   . Subdural hemorrhage following injury ([place]) 05/12/2016   Gisele Pack T Anthonio Mizzell, OTR/L, CLT  Jacqulyne Gladue 10/21/2017, 6:59 PM  [place] MAIN [facility] SERVICES [address] [place], [place], [address] Phone: [phone]   Fax:  [phone]  Name: James [name] MRN: [id number] Date of Birth: 1940-08-07

## 2017-10-21 NOTE — Therapy (Signed)
[place] MAIN [facility] SERVICES [address] [place], [place], [address] Phone: [phone]   Fax:  [phone]  Occupational Therapy Treatment  Patient Details  Name: James [name] MRN: [id number] Date of Birth: 1940-10-26 Referring Provider: Manuella Ghazi   Encounter Date: 10/16/2017  OT End of Session - 10/21/17 1850    Visit Number  26    Number of Visits  48    Date for OT Re-Evaluation  12/22/17    OT Start Time  1145    OT Stop Time  1230    OT Time Calculation (min)  45 min    Activity Tolerance  Patient tolerated treatment well    Behavior During Therapy  [facility] for tasks assessed/performed       Past Medical History:  Diagnosis Date  . Alcohol abuse, in remission   . Atrial fibrillation ([place])   . Bilateral renal masses   . Closed right ankle fracture   . 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 Gait disorder   . Gout   . Hypertension   . Seizure disorder ([place])   . 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     Past Surgical History:  Procedure Laterality Date  . CRANIECTOMY FOR DEPRESSED SKULL FRACTURE  2009   with MRSA infection  . CRANIOTOMY     X 2  . HERNIA REPAIR     during childhood  . ORIF ANKLE FRACTURE Left 2012    There were no vitals filed for this visit.  Subjective Assessment - 10/21/17 1846    Subjective   Patient indicating he is ready to work today.    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  He suffered another fall in 2017.  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     Patient Stated Goals  to be able to use the right hand functionally    Currently in Pain?  No/denies    Multiple Pain Sites  No                   OT Treatments/Exercises (OP) -  10/21/17 1847      Neurological Re-education Exercises   Other Exercises 1  Patient seen for isolated index finger movements to manage clicking pen on and off with right hand use only. Switched to write he can use with isolated thumb leave meant to operate pin opening/closing. Instructed patient on home program to continue to work with these two isolated movements, instructed wife verbally and via demonstration.    Other Exercises 2  Patient seen for picking up 100 pegboard/judy board pieces and working towards flipping/turning to opposite end to place into board, cues for technique and moderate cues to not use mouth or chin to turn item.  Grasp and release of styrofoam cups, regular and slotted with occasional cues for graded pressure.              OT Education - 10/21/17 1849    Education provided  Yes    Education Details  graded pressure    Person(s) Educated  Patient    Methods  Explanation;Demonstration;Verbal cues    Comprehension  Verbal cues required;Returned demonstration;Verbalized understanding  OT Long Term Goals - 10/03/17 2129      OT LONG TERM GOAL #1   Title  Patient will improve right hand function to hold utensil in right hand for self feeding with modified independence.     Baseline  unable to hold utensil at eval    Time  12    Period  Weeks    Status  On-going      OT LONG TERM GOAL #2   Title  Pt will increase hand strength in right by 5# to be able to cut food with modified independence.    Baseline  unable at eval     Time  12    Period  Weeks    Status  On-going      OT LONG TERM GOAL #3   Title  Patient will demonstrate increased awareness and strategies to attend to right side to hold items without dropping items.    Baseline  drops items frequently    Time  12    Period  Weeks    Status  On-going      OT LONG TERM GOAL #4   Title  Patient will demonstrate independence in home exercise program for ROM, strength and coordination of  right hand.    Baseline  no current program    Time  6    Period  Weeks    Status  On-going      OT LONG TERM GOAL #5   Title  Patient will demonstrate the ability to obtain items from right pocket 90% of the time.    Baseline  difficulty getting keys or coins out of right pocket    Time  12    Period  Weeks    Status  On-going            Plan - 10/21/17 1850    Clinical Impression Statement  Patient progressing well and continues to work towards managing graded pressure when picking up or interacting with objects in his day.  He has improved with utensil use with meals and is no longer eating with his fingers now.  He tends to do best with a spoon with eating salads.      Occupational Profile and client history currently impacting functional performance  repeated falls, TBI, expressive aphasia, limited motion in right hand, lack of coordination from previous CVA    Occupational performance deficits (Please refer to evaluation for details):  ADL's;IADL's;Leisure    Rehab Potential  Good    Current Impairments/barriers affecting progress:  expressive aphasia, falls, neglect    OT Frequency  2x / week    OT Duration  12 weeks    OT Treatment/Interventions  Self-care/ADL training;Visual/perceptual remediation/compensation;DME and/or AE instruction;Patient/family education;Moist Heat;Therapeutic exercise;Manual Therapy;Therapeutic activities;Neuromuscular education    Consulted and Agree with Plan of Care  Patient    Family Member Consulted  wife, James [name]       Patient will benefit from skilled therapeutic intervention in order to improve the following deficits and impairments:  Impaired vision/preception, Decreased coordination, Decreased safety awareness, Impaired UE functional use, Decreased strength  Visit Diagnosis: Muscle weakness (generalized)  Other lack of coordination  Neurologic neglect syndrome    Problem List Patient Active Problem List   Diagnosis Date Noted  .  Sleep disturbance   . PAF (paroxysmal atrial fibrillation) ([place])   . Recurrent UTI   . Hypokalemia   . Hyponatremia   . Abnormal urine   . 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 Benign essential HTN   . Urinary retention   . History of gout   . Global aphasia   . SAH (subarachnoid hemorrhage) ([place])   . SDH (subdural hematoma) ([place])   . Lymphocytosis   . Subdural hemorrhage following injury [facility]) 05/12/2016   Bianey Tesoro T Ayleen Mckinstry, OTR/L, CLT  Cheron Coryell 10/21/2017, 6:52 PM  [place] MAIN [facility] SERVICES [address] [place], [place], [address] Phone: [phone]   Fax:  [phone]  Name: James [name] MRN: [id number] Date of Birth: 08/06/1940

## 2017-10-21 NOTE — Therapy (Signed)
[place] MAIN [facility] SERVICES [address] [place], [place], [address] Phone: [phone]   Fax:  [phone]  Occupational Therapy Treatment  Patient Details  Name: James [name] MRN: [id number] Date of Birth: 19-Dec-1940 Referring Provider: Manuella Ghazi   Encounter Date: 10/12/2017  OT End of Session - 10/21/17 1801    Visit Number  25    Number of Visits  48    Date for OT Re-Evaluation  12/22/17    OT Start Time  1300    OT Stop Time  1358    OT Time Calculation (min)  58 min    Activity Tolerance  Patient tolerated treatment well    Behavior During Therapy  [facility] for tasks assessed/performed       Past Medical History:  Diagnosis Date  . Alcohol abuse, in remission   . Atrial fibrillation ([place])   . Bilateral renal masses   . Closed right ankle fracture   . 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 Gait disorder   . Gout   . Hypertension   . Seizure disorder ([place])   . 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     Past Surgical History:  Procedure Laterality Date  . CRANIECTOMY FOR DEPRESSED SKULL FRACTURE  2009   with MRSA infection  . CRANIOTOMY     X 2  . HERNIA REPAIR     during childhood  . ORIF ANKLE FRACTURE Left 2012    There were no vitals filed for this visit.  Subjective Assessment - 10/21/17 1800    Subjective   Wife reports patient continues to do well with self feeding when going out to restaurants.  They have a friend with them and planning to go out after session.    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  He suffered another fall in 2017.  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     Patient Stated Goals  to be able to use the right hand functionally    Currently in Pain?  No/denies    Multiple Pain Sites  No                    OT Treatments/Exercises (OP) - 10/21/17 1805      Neurological Re-education Exercises   Other Exercises 1  Patient seen for right hand modulation of graded pressure to manipulate and pick up items of varying size, weight and grip.  Cues provided at times for use of vision to help with compensation.  Patient seen for use of large grip spoon for resistive scooping then switching to picking up light cotton swabs with adjustment of grip and pinch.  Patient also seen for facilitation of grip on a variety of tools such as pen, spoon, hammer and screwdriver.              OT Education - 10/21/17 1800    Education provided  Yes    Education Details  HEP with isolated finger movements and pen    Person(s) Educated  Patient    Methods  Explanation;Demonstration;Verbal cues    Comprehension  Verbal cues required;Returned demonstration;Verbalized understanding          OT Long Term Goals - 10/03/17 2129      OT  LONG TERM GOAL #1   Title  Patient will improve right hand function to hold utensil in right hand for self feeding with modified independence.     Baseline  unable to hold utensil at eval    Time  12    Period  Weeks    Status  On-going      OT LONG TERM GOAL #2   Title  Pt will increase hand strength in right by 5# to be able to cut food with modified independence.    Baseline  unable at eval     Time  12    Period  Weeks    Status  On-going      OT LONG TERM GOAL #3   Title  Patient will demonstrate increased awareness and strategies to attend to right side to hold items without dropping items.    Baseline  drops items frequently    Time  12    Period  Weeks    Status  On-going      OT LONG TERM GOAL #4   Title  Patient will demonstrate independence in home exercise program for ROM, strength and coordination of right hand.    Baseline  no current program    Time  6    Period  Weeks    Status  On-going      OT LONG TERM GOAL #5    Title  Patient will demonstrate the ability to obtain items from right pocket 90% of the time.    Baseline  difficulty getting keys or coins out of right pocket    Time  12    Period  Weeks    Status  On-going            Plan - 10/21/17 1801    Clinical Impression Statement  Patient continues to work towards modulation of graded pressure to pick up and manipulate items of all types.  Patient occasionally requires cues for appropriate grip pattern and level of pressure depending on the object.  He continues to progress and is showing carry over of skills with daily tasks.     Occupational Profile and client history currently impacting functional performance  repeated falls, TBI, expressive aphasia, limited motion in right hand, lack of coordination from previous CVA    Occupational performance deficits (Please refer to evaluation for details):  ADL's;IADL's;Leisure    Rehab Potential  Good    Current Impairments/barriers affecting progress:  expressive aphasia, falls, neglect    OT Frequency  2x / week    OT Duration  12 weeks    OT Treatment/Interventions  Self-care/ADL training;Visual/perceptual remediation/compensation;DME and/or AE instruction;Patient/family education;Moist Heat;Therapeutic exercise;Manual Therapy;Therapeutic activities;Neuromuscular education    Consulted and Agree with Plan of Care  Patient    Family Member Consulted  wife, [name]       Patient will benefit from skilled therapeutic intervention in order to improve the following deficits and impairments:  Impaired vision/preception, Decreased coordination, Decreased safety awareness, Impaired UE functional use, Decreased strength  Visit Diagnosis: Muscle weakness (generalized)  Other lack of coordination  Neurologic neglect syndrome    Problem List Patient Active Problem List   Diagnosis Date Noted  . Sleep disturbance   . PAF (paroxysmal atrial fibrillation) ([place])   . Recurrent UTI   . Hypokalemia   .  Hyponatremia   . Abnormal urine   . 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 Benign essential HTN   . Urinary retention   .  History of gout   . Global aphasia   . SAH (subarachnoid hemorrhage) ([place])   . SDH (subdural hematoma) ([place])   . Lymphocytosis   . Subdural hemorrhage following injury ([place]) 05/12/2016   Chenika Nevils T Estefanie [place], OTR/L, CLT  Harshan Kearley 10/21/2017, 6:09 PM  [place] MAIN [facility] SERVICES [address] [place], [place], [address] Phone: [phone]   Fax:  [phone]  Name: Kutler Vanvranken MRN: [id number] Date of Birth: 03/02/1941

## 2017-10-23 ENCOUNTER — Ambulatory Visit: Payer: PPO | Attending: Neurology | Admitting: Occupational Therapy

## 2017-10-23 DIAGNOSIS — M6281 Muscle weakness (generalized): Secondary | ICD-10-CM | POA: Insufficient documentation

## 2017-10-23 DIAGNOSIS — R278 Other lack of coordination: Secondary | ICD-10-CM

## 2017-10-23 DIAGNOSIS — R414 Neurologic neglect syndrome: Secondary | ICD-10-CM | POA: Diagnosis not present

## 2017-10-25 ENCOUNTER — Ambulatory Visit: Payer: PPO | Admitting: Occupational Therapy

## 2017-10-25 DIAGNOSIS — R278 Other lack of coordination: Secondary | ICD-10-CM

## 2017-10-25 DIAGNOSIS — M6281 Muscle weakness (generalized): Secondary | ICD-10-CM

## 2017-10-28 NOTE — Therapy (Signed)
[place] MAIN [facility] SERVICES [address] [place], [place], [address] Phone: [phone]   Fax:  [phone]  Occupational Therapy Treatment  Patient Details  Name: James [name] MRN: [id number] Date of Birth: 15-Jan-1941 Referring Provider: Manuella Ghazi   Encounter Date: 10/23/2017  OT End of Session - 10/28/17 1141    Visit Number  28    Number of Visits  91    Date for OT Re-Evaluation  12/22/17    Authorization Type  Medicare    OT Start Time  7893    OT Stop Time  1100    OT Time Calculation (min)  45 min    Activity Tolerance  Patient tolerated treatment well    Behavior During Therapy  [facility] for tasks assessed/performed       Past Medical History:  Diagnosis Date  . Alcohol abuse, in remission   . Atrial fibrillation ([place])   . Bilateral renal masses   . Closed right ankle fracture   . 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 Gait disorder   . Gout   . Hypertension   . Seizure disorder ([place])   . 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     Past Surgical History:  Procedure Laterality Date  . CRANIECTOMY FOR DEPRESSED SKULL FRACTURE  2009   with MRSA infection  . CRANIOTOMY     X 2  . HERNIA REPAIR     during childhood  . ORIF ANKLE FRACTURE Left 2012    There were no vitals filed for this visit.  Subjective Assessment - 10/28/17 1140    Subjective   Patient indicating he got up early this morning and had a dentist appointment.      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  He suffered another fall in 2017.  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     Patient Stated Goals  to be able to use the right hand functionally    Currently in Pain?  No/denies    Multiple Pain Sites  No                    OT Treatments/Exercises (OP) - 10/28/17 1140      ADLs   ADL Comments  Patient seen for Handwriting skills with right hand to hold pen with tripod grasp, tactile cues provided as well as verbal cues for letter formation.  Focused on performing name, first and last.       Neurological Re-education Exercises   Other Exercises 1  Patient seen for RUE strengthening with Medium soft red putty, 2 containers for gross grip, pinch-lateral, 3 point and 2 point pinch with cues for technique.  Right hand pulling while left hand stabilizes, twisting in 2 directions, resistive supination.    Other Exercises 2  Manipulation of Round pegs with attempts to pick up from tabletop with moderate difficulty.  When therapist hands object to patient he can take the round piece and place into board with cues for turning, difficulty with turning object to correct position at times.              OT Education - 10/28/17 1141    Education provided  Yes  Education Details  handwriting, tripod grasp    Person(s) Educated  Patient    Methods  Explanation;Demonstration;Verbal cues    Comprehension  Verbal cues required;Returned demonstration;Verbalized understanding          OT Long Term Goals - 10/23/17 1051      OT LONG TERM GOAL #1   Title  Patient will improve right hand function to hold utensil in right hand for self feeding with modified independence.     Baseline  unable to hold utensil at eval    Time  12    Period  Weeks    Status  On-going      OT LONG TERM GOAL #2   Title  Pt will increase hand strength in right by 5# to be able to cut food with modified independence.    Baseline  unable at eval     Time  12    Period  Weeks    Status  On-going      OT LONG TERM GOAL #3   Title  Patient will demonstrate increased awareness and strategies to attend to right side to hold items without dropping items.    Baseline  drops items frequently    Time  12     Period  Weeks    Status  On-going      OT LONG TERM GOAL #4   Title  Patient will demonstrate independence in home exercise program for ROM, strength and coordination of right hand.    Baseline  no current program    Time  6    Period  Weeks    Status  On-going      OT LONG TERM GOAL #5   Title  Patient will demonstrate the ability to obtain items from right pocket 90% of the time.    Baseline  difficulty getting keys or coins out of right pocket    Time  12    Period  Weeks    Status  On-going            Plan - 10/28/17 1142    Clinical Impression Statement  Patient continues to progress in all areas, emphasis on heavy work with right hand then light touch tasks to advance proprioception and graded pressure.  Patient continues to benefit from skilled OT and is progressing well towards goals, continue to work towards increased functional use of right UE for necessary daily tasks.     Occupational Profile and client history currently impacting functional performance  repeated falls, TBI, expressive aphasia, limited motion in right hand, lack of coordination from previous CVA    Occupational performance deficits (Please refer to evaluation for details):  ADL's;IADL's;Leisure    Rehab Potential  Good    Current Impairments/barriers affecting progress:  expressive aphasia, falls, neglect    OT Frequency  2x / week    OT Duration  12 weeks    OT Treatment/Interventions  Self-care/ADL training;Visual/perceptual remediation/compensation;DME and/or AE instruction;Patient/family education;Moist Heat;Therapeutic exercise;Manual Therapy;Therapeutic activities;Neuromuscular education    Consulted and Agree with Plan of Care  Patient       Patient will benefit from skilled therapeutic intervention in order to improve the following deficits and impairments:  Impaired vision/preception, Decreased coordination, Decreased safety awareness, Impaired UE functional use, Decreased strength  Visit  Diagnosis: Muscle weakness (generalized)  Other lack of coordination  Neurologic neglect syndrome    Problem List Patient Active Problem List   Diagnosis Date Noted  . Sleep disturbance   .  PAF (paroxysmal atrial fibrillation) ([place])   . Recurrent UTI   . Hypokalemia   . Hyponatremia   . Abnormal urine   . 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 Benign essential HTN   . Urinary retention   . History of gout   . Global aphasia   . SAH (subarachnoid hemorrhage) ([place])   . SDH (subdural hematoma) ([place])   . Lymphocytosis   . Subdural hemorrhage following injury ([place]) 05/12/2016   Amy T Lovett, OTR/L, CLT  Lovett,Amy 10/28/2017, 11:43 AM  [place] MAIN [facility] SERVICES [address] [place], [place], [address] Phone: [phone]   Fax:  [phone]  Name: James [name] MRN: [id number] Date of Birth: 28-Jun-1941

## 2017-10-29 ENCOUNTER — Encounter: Payer: Self-pay | Admitting: Occupational Therapy

## 2017-10-29 NOTE — Therapy (Signed)
[place] MAIN [facility] SERVICES [address] [place], [place], [address] Phone: [phone]   Fax:  [phone]  Occupational Therapy Treatment  Patient Details  Name: James [name] MRN: [id number] Date of Birth: 17-Jul-1941 Referring Provider: Manuella Ghazi   Encounter Date: 10/25/2017  OT End of Session - 10/29/17 1047    Visit Number  29    Number of Visits  38    Date for OT Re-Evaluation  12/22/17    Authorization Type  Medicare    OT Start Time  1015    OT Stop Time  1059    OT Time Calculation (min)  44 min    Activity Tolerance  Patient tolerated treatment well    Behavior During Therapy  [facility] for tasks assessed/performed       Past Medical History:  Diagnosis Date  . Alcohol abuse, in remission   . Atrial fibrillation ([place])   . Bilateral renal masses   . Closed right ankle fracture   . 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 Gait disorder   . Gout   . Hypertension   . Seizure disorder ([place])   . 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     Past Surgical History:  Procedure Laterality Date  . CRANIECTOMY FOR DEPRESSED SKULL FRACTURE  2009   with MRSA infection  . CRANIOTOMY     X 2  . HERNIA REPAIR     during childhood  . ORIF ANKLE FRACTURE Left 2012    There were no vitals filed for this visit.  Subjective Assessment - 10/29/17 1040    Subjective   Patient attempting to tell therapist something but unable to understand what he is saying, wife is unsure what patient was saying as well. He could say, "nevermind".    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  He suffered another fall in 2017.  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     Patient Stated Goals  to be able to use the right hand functionally    Currently in Pain?   No/denies    Multiple Pain Sites  No                   OT Treatments/Exercises (OP) - 10/29/17 1045      ADLs   ADL Comments  Manipulation of variety of medication bottles, some with child locks, some flip tops and some with screw tops. Patient demonstrates difficulty with child safety lock tops the most, especially the one with the push down tab and then turn.        Neurological Re-education Exercises   Other Exercises 1  Patient seen for manipulation of a variety locks, door and window locks continues to have the most difficulty with the chain lock type with attempts to use thumb and index for pinch on slide. Manipulation of nuts and bolts with cues for thumb finger combinations.             OT Education - 10/29/17 1046    Education Details  opening medication bottles    Person(s) Educated  Patient    Methods  Explanation;Demonstration;Verbal cues    Comprehension  Verbal cues required;Returned demonstration;Verbalized understanding          OT Long Term  Goals - 10/23/17 1051      OT LONG TERM GOAL #1   Title  Patient will improve right hand function to hold utensil in right hand for self feeding with modified independence.     Baseline  unable to hold utensil at eval    Time  12    Period  Weeks    Status  On-going      OT LONG TERM GOAL #2   Title  Pt will increase hand strength in right by 5# to be able to cut food with modified independence.    Baseline  unable at eval     Time  12    Period  Weeks    Status  On-going      OT LONG TERM GOAL #3   Title  Patient will demonstrate increased awareness and strategies to attend to right side to hold items without dropping items.    Baseline  drops items frequently    Time  12    Period  Weeks    Status  On-going      OT LONG TERM GOAL #4   Title  Patient will demonstrate independence in home exercise program for ROM, strength and coordination of right hand.    Baseline  no current program    Time   6    Period  Weeks    Status  On-going      OT LONG TERM GOAL #5   Title  Patient will demonstrate the ability to obtain items from right pocket 90% of the time.    Baseline  difficulty getting keys or coins out of right pocket    Time  12    Period  Weeks    Status  On-going            Plan - 10/29/17 1048    Clinical Impression Statement  Patient demonstrates difficulty with opening child safety lock medication bottles, difficulty especially with ones requiring to push down tab and turn top, this is the type of top patient has at home.  Discussed the option of requesting pharamacist to change type of tops.   Patient continues to benefit from skilled OT to increase independence in daily tasks.      Occupational Profile and client history currently impacting functional performance  repeated falls, TBI, expressive aphasia, limited motion in right hand, lack of coordination from previous CVA    Occupational performance deficits (Please refer to evaluation for details):  ADL's;IADL's;Leisure    Rehab Potential  Good    Current Impairments/barriers affecting progress:  expressive aphasia, falls, neglect    OT Frequency  2x / week    OT Duration  12 weeks    OT Treatment/Interventions  Self-care/ADL training;Visual/perceptual remediation/compensation;DME and/or AE instruction;Patient/family education;Moist Heat;Therapeutic exercise;Manual Therapy;Therapeutic activities;Neuromuscular education    Consulted and Agree with Plan of Care  Patient       Patient will benefit from skilled therapeutic intervention in order to improve the following deficits and impairments:  Impaired vision/preception, Decreased coordination, Decreased safety awareness, Impaired UE functional use, Decreased strength  Visit Diagnosis: Muscle weakness (generalized)  Other lack of coordination    Problem List Patient Active Problem List   Diagnosis Date Noted  . Sleep disturbance   . PAF (paroxysmal atrial  fibrillation) ([place])   . Recurrent UTI   . Hypokalemia   . Hyponatremia   . Abnormal urine   . 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 Benign  essential HTN   . Urinary retention   . History of gout   . Global aphasia   . SAH (subarachnoid hemorrhage) ([place])   . SDH (subdural hematoma) ([place])   . Lymphocytosis   . Subdural hemorrhage following injury ([place]) 05/12/2016   James [name] James [name], OTR/L, CLT  Caralyn Twining 10/29/2017, 10:52 AM  [place] MAIN [facility] SERVICES [address] [place], [place], [address] Phone: [phone]   Fax:  [phone]  Name: James [name] MRN: [id number] Date of Birth: [birth year]

## 2017-10-30 ENCOUNTER — Ambulatory Visit: Payer: PPO | Admitting: Occupational Therapy

## 2017-10-30 DIAGNOSIS — R414 Neurologic neglect syndrome: Secondary | ICD-10-CM

## 2017-10-30 DIAGNOSIS — M6281 Muscle weakness (generalized): Secondary | ICD-10-CM

## 2017-10-30 DIAGNOSIS — R278 Other lack of coordination: Secondary | ICD-10-CM

## 2017-11-01 ENCOUNTER — Ambulatory Visit: Payer: PPO | Admitting: Occupational Therapy

## 2017-11-01 DIAGNOSIS — M6281 Muscle weakness (generalized): Secondary | ICD-10-CM | POA: Diagnosis not present

## 2017-11-01 DIAGNOSIS — R278 Other lack of coordination: Secondary | ICD-10-CM

## 2017-11-01 DIAGNOSIS — R414 Neurologic neglect syndrome: Secondary | ICD-10-CM

## 2017-11-04 ENCOUNTER — Encounter: Payer: Self-pay | Admitting: Occupational Therapy

## 2017-11-04 NOTE — Therapy (Signed)
[place] MAIN [facility] SERVICES [address] [place], [place], [address] Phone: [phone]   Fax:  [phone]  Occupational Therapy Treatment  Patient Details  Name: James [name] MRN: [id number] Date of Birth: August 28, 1940 Referring Provider: Manuella [name]   Encounter Date: 11/01/2017  OT End of Session - 11/04/17 1815    Visit Number  31    Number of Visits  48    Date for OT Re-Evaluation  12/22/17    OT Start Time  1015    OT Stop Time  1059    OT Time Calculation (min)  44 min    Activity Tolerance  Patient tolerated treatment well    Behavior During Therapy  [facility] for tasks assessed/performed       Past Medical History:  Diagnosis Date  . Alcohol abuse, in remission   . Atrial fibrillation ([place])   . Bilateral renal masses   . Closed right ankle fracture   . 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 Gait disorder   . Gout   . Hypertension   . Seizure disorder ([place])   . 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     Past Surgical History:  Procedure Laterality Date  . CRANIECTOMY FOR DEPRESSED SKULL FRACTURE  2009   with MRSA infection  . CRANIOTOMY     X 2  . HERNIA REPAIR     during childhood  . ORIF ANKLE FRACTURE Left 2012    There were no vitals filed for this visit.  Subjective Assessment - 11/04/17 1811    Subjective   Wife reports she is pleased that patient is willing to continue with therapy one time a week.  She sees a lot of improvement in his right handed use.     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  He suffered another fall in 2017.  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     Patient Stated Goals  to be able to use the right hand functionally    Currently in Pain?  No/denies    Multiple Pain Sites  No                    OT Treatments/Exercises (OP) - 11/04/17 1812      ADLs   ADL Comments  Patient seen for handwriting skills, cues with tripod grasp for pen and use of built up pen during writing.  Patient working on name, first and last in print and cursive with fair legibility.  Cues for adjustment of pressure with pen grip.      Neurological Re-education Exercises   Other Exercises 1  Patient seen for graded pressure tasks with manipulation of marbles and cotton balls, alternating picking up and occasional cues to adjust tension, pressure of grip.  Patient picking up and manipulating small toothpicks from flat surface and then progressed to use of tongs to pick up pieces, therapist assist to place fingers onto tongs for use.              OT Education - 11/04/17 1815    Education provided  Yes    Education Details  use of tongs, handwriting    Person(s) Educated  Patient    Methods  Explanation;Demonstration;Verbal cues    Comprehension  Verbal cues required;Returned demonstration;Verbalized understanding          OT Long Term Goals - 11/04/17 1436      OT LONG TERM GOAL #1   Title  Patient will improve right hand function to hold utensil in right hand for self feeding with modified independence.     Baseline  unable to hold utensil at eval, now able to hold utensils and self feed with minimal spillage.    Time  12    Period  Weeks    Status  On-going      OT LONG TERM GOAL #2   Title  Pt will increase hand strength in right by 5# to be able to cut food with modified independence.    Baseline  unable at eval, able to cut meat with occasional assist at 30 visit.    Time  12    Period  Weeks    Status  On-going      OT LONG TERM GOAL #3   Title  Patient will demonstrate increased awareness and strategies to attend to right side to hold items without dropping items.    Baseline  drops items frequently, 30 visit-patient dropping objects less frequently and is  more aware when holding objects in hand    Time  12    Period  Weeks    Status  On-going      OT LONG TERM GOAL #4   Title  Patient will demonstrate independence in home exercise program for ROM, strength and coordination of right hand.    Baseline  no current program, modified independent with current program at 30 visit but continuing to add exercises.    Time  6    Period  Weeks    Status  On-going      OT LONG TERM GOAL #5   Title  Patient will demonstrate the ability to obtain items from right pocket 90% of the time.    Baseline  difficulty getting keys or coins out of right pocket, 30 visit update-able to demonstrate 75% of the time    Time  12    Period  Weeks    Status  On-going            Plan - 11/04/17 1816    Clinical Impression Statement  Patient continues to progress right hand and with handwriting skills. Patient performs best with built-up grip when writing his name in print for script. Improved legibility compared to initial evaluation sample. He will benefit from continued work to manage graded pressure in right hand and increasing independence with daily tasks with use of right upper extremity and bilateral tasks.    Occupational Profile and client history currently impacting functional performance  repeated falls, TBI, expressive aphasia, limited motion in right hand, lack of coordination from previous CVA    Occupational performance deficits (Please refer to evaluation for details):  ADL's;IADL's;Leisure    Rehab Potential  Good    Current Impairments/barriers affecting progress:  expressive aphasia, falls, neglect    OT Frequency  2x / week    OT Duration  12 weeks    OT Treatment/Interventions  Self-care/ADL training;Visual/perceptual remediation/compensation;DME and/or AE instruction;Patient/family education;Moist Heat;Therapeutic exercise;Manual Therapy;Therapeutic activities;Neuromuscular education    Consulted and Agree with Plan of Care  Patient     Family Member Consulted  wife, James [name]       Patient will benefit from skilled therapeutic intervention in order to improve the following deficits and impairments:  Impaired vision/preception,  Decreased coordination, Decreased safety awareness, Impaired UE functional use, Decreased strength  Visit Diagnosis: Muscle weakness (generalized)  Other lack of coordination  Neurologic neglect syndrome    Problem List Patient Active Problem List   Diagnosis Date Noted  . Sleep disturbance   . PAF (paroxysmal atrial fibrillation) ([place])   . Recurrent UTI   . Hypokalemia   . Hyponatremia   . Abnormal urine   . 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 Benign essential HTN   . Urinary retention   . History of gout   . Global aphasia   . SAH (subarachnoid hemorrhage) ([place])   . SDH (subdural hematoma) ([place])   . Lymphocytosis   . Subdural hemorrhage following injury ([place]) 05/12/2016   Eulene Pekar T Dalan Cowger, OTR/L, CLT  Sharyl Panchal 11/04/2017, 6:20 PM  [place] MAIN [facility] SERVICES [address] [place], [place], [address] Phone: [phone]   Fax:  [phone]  Name: James [name] MRN: [id number] Date of Birth: 06-18-1941

## 2017-11-04 NOTE — Therapy (Signed)
[place] MAIN [facility] SERVICES [address] [place], [place], [address] Phone: [phone]   Fax:  [phone]  Occupational Therapy Treatment/Progress Update Update from 09/21/17 to 10/30/17, 30th visit  Patient Details  Name: James [name] MRN: [id number] Date of Birth: [birth year] Referring Provider: Manuella Ghazi   Encounter Date: 10/30/2017  OT End of Session - 11/04/17 1431    Visit Number  30    Number of Visits  57    Date for OT Re-Evaluation  12/22/17    Authorization Type  Medicare    OT Start Time  1015    OT Stop Time  1100    OT Time Calculation (min)  45 min    Activity Tolerance  Patient tolerated treatment well    Behavior During Therapy  [facility] for tasks assessed/performed       Past Medical History:  Diagnosis Date  . Alcohol abuse, in remission   . Atrial fibrillation ([place])   . Bilateral renal masses   . Closed right ankle fracture   . 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 Gait disorder   . Gout   . Hypertension   . Seizure disorder ([place])   . 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     Past Surgical History:  Procedure Laterality Date  . CRANIECTOMY FOR DEPRESSED SKULL FRACTURE  2009   with MRSA infection  . CRANIOTOMY     X 2  . HERNIA REPAIR     during childhood  . ORIF ANKLE FRACTURE Left 2012    There were no vitals filed for this visit.  Subjective Assessment - 11/04/17 1430    Subjective   Patient indicating he wants to talk about his therapy sessions and progress. Patient indicated he would like to continue therapy one time a week rather than two.    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  He suffered another fall in 2017.  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     Patient Stated Goals  to be able to use the right hand functionally    Currently in Pain?  No/denies    Multiple Pain Sites  No                   OT Treatments/Exercises (OP) - 11/04/17 1442      ADLs   ADL Comments  Patient seen for handwriting skills with cues for tripod grasp on built up pen, demonstration to draw lines on paper using ruler.  Performing name in cursive and print with improved legilbiilty, mulitple trials completed.  Patient seen for use of scissors in right hand to cut straight lines, curvy and wavy designs with 70% accuracy.        Neurological Re-education Exercises   Other Exercises 1  Manipulation of coins from flat tabletop and placing into resistive bank with use of right hand.  Has difficulty at times to pick up from flat surface and occasionally has to slide coin to the edge to pick up.  Difficulty at times to place into resistive bank.             OT Education - 11/04/17 1431    Education provided  Yes  Education Details  goals, communication strategies for choosing therapy sessions    Person(s) Educated  Patient;Spouse    Methods  Explanation;Demonstration;Verbal cues    Comprehension  Verbal cues required;Returned demonstration;Verbalized understanding          OT Long Term Goals - 11/04/17 1436      OT LONG TERM GOAL #1   Title  Patient will improve right hand function to hold utensil in right hand for self feeding with modified independence.     Baseline  unable to hold utensil at eval, now able to hold utensils and self feed with minimal spillage.    Time  12    Period  Weeks    Status  On-going      OT LONG TERM GOAL #2   Title  Pt will increase hand strength in right by 5# to be able to cut food with modified independence.    Baseline  unable at eval, able to cut meat with occasional assist at 30 visit.    Time  12    Period  Weeks    Status  On-going      OT LONG TERM GOAL #3   Title  Patient will  demonstrate increased awareness and strategies to attend to right side to hold items without dropping items.    Baseline  drops items frequently, 30 visit-patient dropping objects less frequently and is more aware when holding objects in hand    Time  12    Period  Weeks    Status  On-going      OT LONG TERM GOAL #4   Title  Patient will demonstrate independence in home exercise program for ROM, strength and coordination of right hand.    Baseline  no current program, modified independent with current program at 30 visit but continuing to add exercises.    Time  6    Period  Weeks    Status  On-going      OT LONG TERM GOAL #5   Title  Patient will demonstrate the ability to obtain items from right pocket 90% of the time.    Baseline  difficulty getting keys or coins out of right pocket, 30 visit update-able to demonstrate 75% of the time    Time  12    Period  Weeks    Status  On-going            Plan - 11/04/17 1434    Clinical Impression Statement  Patient continues to make excellent progress with right upper extremity function and managing increased tone moving towards normal movement patterns to hold and manipulate objects. Although he is making great progress he would like to decrease his frequency to one time a week after this week. Will plan to see patient one time a week for four weeks and then allow patient to perform a home program for a couple of weeks and then return for a reassessment. Patient and wife are both in agreement with this plan.  He continues to benefit from skilled OT to increase independence and safety in daily tasks.  Goals updated to reflect progress in goal section.     Occupational Profile and client history currently impacting functional performance  repeated falls, TBI, expressive aphasia, limited motion in right hand, lack of coordination from previous CVA    Occupational performance deficits (Please refer to evaluation for details):   ADL's;IADL's;Leisure    Rehab Potential  Good    Current Impairments/barriers affecting progress:  expressive  aphasia, falls, neglect    OT Frequency  2x / week    OT Duration  12 weeks    OT Treatment/Interventions  Self-care/ADL training;Visual/perceptual remediation/compensation;DME and/or AE instruction;Patient/family education;Moist Heat;Therapeutic exercise;Manual Therapy;Therapeutic activities;Neuromuscular education    Consulted and Agree with Plan of Care  Patient    Family Member Consulted  wife, [name]       Patient will benefit from skilled therapeutic intervention in order to improve the following deficits and impairments:  Impaired vision/preception, Decreased coordination, Decreased safety awareness, Impaired UE functional use, Decreased strength  Visit Diagnosis: Muscle weakness (generalized)  Other lack of coordination  Neurologic neglect syndrome    Problem List Patient Active Problem List   Diagnosis Date Noted  . Sleep disturbance   . PAF (paroxysmal atrial fibrillation) ([place])   . Recurrent UTI   . Hypokalemia   . Hyponatremia   . Abnormal urine   . 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 Benign essential HTN   . Urinary retention   . History of gout   . Global aphasia   . SAH (subarachnoid hemorrhage) ([place])   . SDH (subdural hematoma) ([place])   . Lymphocytosis   . Subdural hemorrhage following injury [facility]) 05/12/2016   Gaberiel Youngblood T Rinoa Garr[place]one, OTR/L, CLT  Lillieann Pavlich 11/04/2017, 2:46 PM  [place] MAIN [facility] SERVICES [address] [place], [place], [address] Phone: [phone]   Fax:  [phone]  Name: James [name] MRN: [id number] Date of Birth: 1940/10/06

## 2017-11-06 ENCOUNTER — Ambulatory Visit: Payer: PPO | Admitting: Occupational Therapy

## 2017-11-08 ENCOUNTER — Ambulatory Visit: Payer: PPO | Admitting: Occupational Therapy

## 2017-11-08 DIAGNOSIS — M6281 Muscle weakness (generalized): Secondary | ICD-10-CM

## 2017-11-08 DIAGNOSIS — R414 Neurologic neglect syndrome: Secondary | ICD-10-CM

## 2017-11-08 DIAGNOSIS — R278 Other lack of coordination: Secondary | ICD-10-CM

## 2017-11-11 ENCOUNTER — Encounter: Payer: Self-pay | Admitting: Occupational Therapy

## 2017-11-15 ENCOUNTER — Ambulatory Visit: Payer: PPO | Admitting: Occupational Therapy

## 2017-11-15 DIAGNOSIS — M6281 Muscle weakness (generalized): Secondary | ICD-10-CM | POA: Diagnosis not present

## 2017-11-15 DIAGNOSIS — R278 Other lack of coordination: Secondary | ICD-10-CM

## 2017-11-15 DIAGNOSIS — R414 Neurologic neglect syndrome: Secondary | ICD-10-CM

## 2017-11-18 ENCOUNTER — Encounter: Payer: Self-pay | Admitting: Occupational Therapy

## 2017-11-18 NOTE — Therapy (Signed)
[place] MAIN [facility] SERVICES [address] [place], [place], [address] Phone: [phone]   Fax:  [phone]  Occupational Therapy Treatment  Patient Details  Name: James [name] MRN: [id number] Date of Birth: 1941-03-15 Referring Provider: Manuella Ghazi   Encounter Date: 11/15/2017  OT End of Session - 11/18/17 1258    Visit Number  33    Number of Visits  48    Date for OT Re-Evaluation  12/22/17    OT Start Time  1400    OT Stop Time  1446    OT Time Calculation (min)  46 min    Activity Tolerance  Patient tolerated treatment well    Behavior During Therapy  [facility] for tasks assessed/performed       Past Medical History:  Diagnosis Date  . Alcohol abuse, in remission   . Atrial fibrillation ([place])   . Bilateral renal masses   . Closed right ankle fracture   . 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 Gait disorder   . Gout   . Hypertension   . Seizure disorder ([place])   . 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     Past Surgical History:  Procedure Laterality Date  . CRANIECTOMY FOR DEPRESSED SKULL FRACTURE  2009   with MRSA infection  . CRANIOTOMY     X 2  . HERNIA REPAIR     during childhood  . ORIF ANKLE FRACTURE Left 2012    There were no vitals filed for this visit.  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  He suffered another fall in 2017.  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     Patient Stated Goals  to be able to use the right hand functionally    Currently in Pain?  No/denies    Multiple Pain Sites  No                   OT Treatments/Exercises (OP) - 11/18/17 1257       Neurological Re-education Exercises   Other Exercises 1  Patient seen for BUE strengthening with use of UBE in sitting, cues for grip and hand placement on the right.  Patient performing forwards/backwards with alternating levels of resistance of 4.0 to 4.4.  Therapist in constant attendance to ensure grip and adjust settings.  Occasional redirection to right grip.      Other Exercises 2  Patient seen for manipulation of cards for flipping and sorting into piles, shuffling 2 ways and dealing.  Patient has difficulty with shuffling cards the traditional method and cannot get right hand into the correct position to perform.  Patient seen for manipulation of toothpicks, attempts to pick up from tabletop with difficulty using right hand, transitioned to placing into putty in vertical position and has minimal resistance to pull out and place into small holed container. Manipulation of small glass beads to pick up with right hand and move towards the palm of the hand using translatory movements of the hand.              OT Education - 11/18/17 1258  Education provided  Yes    Education Details  home exercises for right hand graded pressure activities    Person(s) Educated  Patient    Methods  Explanation;Demonstration;Verbal cues    Comprehension  Verbal cues required;Returned demonstration;Verbalized understanding          OT Long Term Goals - 11/04/17 1436      OT LONG TERM GOAL #1   Title  Patient will improve right hand function to hold utensil in right hand for self feeding with modified independence.     Baseline  unable to hold utensil at eval, now able to hold utensils and self feed with minimal spillage.    Time  12    Period  Weeks    Status  On-going      OT LONG TERM GOAL #2   Title  Pt will increase hand strength in right by 5# to be able to cut food with modified independence.    Baseline  unable at eval, able to cut meat with occasional assist at 30 visit.    Time  12     Period  Weeks    Status  On-going      OT LONG TERM GOAL #3   Title  Patient will demonstrate increased awareness and strategies to attend to right side to hold items without dropping items.    Baseline  drops items frequently, 30 visit-patient dropping objects less frequently and is more aware when holding objects in hand    Time  12    Period  Weeks    Status  On-going      OT LONG TERM GOAL #4   Title  Patient will demonstrate independence in home exercise program for ROM, strength and coordination of right hand.    Baseline  no current program, modified independent with current program at 30 visit but continuing to add exercises.    Time  6    Period  Weeks    Status  On-going      OT LONG TERM GOAL #5   Title  Patient will demonstrate the ability to obtain items from right pocket 90% of the time.    Baseline  difficulty getting keys or coins out of right pocket, 30 visit update-able to demonstrate 75% of the time    Time  12    Period  Weeks    Status  On-going            Plan - 11/18/17 1259    Clinical Impression Statement  Continued progress in all areas with neuro reeducation of right hand.  Patient continues to demo difficulty with picking up items from a flat surface especially if the items is more flat and lacks dimension.  Continue to work towards improving manipulation skills of the right hand and increase functional use for necessary daily tasks.     Occupational Profile and client history currently impacting functional performance  repeated falls, TBI, expressive aphasia, limited motion in right hand, lack of coordination from previous CVA    Occupational performance deficits (Please refer to evaluation for details):  ADL's;IADL's;Leisure    Rehab Potential  Good       Patient will benefit from skilled therapeutic intervention in order to improve the following deficits and impairments:  Impaired vision/preception, Decreased coordination, Decreased safety  awareness, Impaired UE functional use, Decreased strength  Visit Diagnosis: Muscle weakness (generalized)  Other lack of coordination  Neurologic neglect syndrome    Problem List Patient Active Problem  List   Diagnosis Date Noted  . Sleep disturbance   . PAF (paroxysmal atrial fibrillation) ([place])   . Recurrent UTI   . Hypokalemia   . Hyponatremia   . Abnormal urine   . 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 Benign essential HTN   . Urinary retention   . History of gout   . Global aphasia   . SAH (subarachnoid hemorrhage) ([place])   . SDH (subdural hematoma) ([place])   . Lymphocytosis   . Subdural hemorrhage following injury ([place]) 05/12/2016   Amy T Lovett, OTR/L, CLT  Lovett,Amy 11/18/2017, 1:02 PM  [place] MAIN [facility] SERVICES [address] [place], [place], [address] Phone: [phone]   Fax:  [phone]  Name: James [name] MRN: [id number] Date of Birth: 11-12-1940

## 2017-11-18 NOTE — Therapy (Signed)
[place] MAIN [facility] SERVICES [address] [place], [place], [address] Phone: [phone]   Fax:  [phone]  Occupational Therapy Treatment  Patient Details  Name: James [name] MRN: [id number] Date of Birth: [birth year] Referring Provider: Manuella Ghazi   Encounter Date: 11/08/2017  OT End of Session - 11/18/17 1244    Visit Number  32    Number of Visits  55    Date for OT Re-Evaluation  12/22/17    Authorization Type  Medicare    OT Start Time  3536    OT Stop Time  1100    OT Time Calculation (min)  45 min    Activity Tolerance  Patient tolerated treatment well    Behavior During Therapy  [facility] for tasks assessed/performed       Past Medical History:  Diagnosis Date  . Alcohol abuse, in remission   . Atrial fibrillation ([place])   . Bilateral renal masses   . Closed right ankle fracture   . 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 Gait disorder   . Gout   . Hypertension   . Seizure disorder ([place])   . 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     Past Surgical History:  Procedure Laterality Date  . CRANIECTOMY FOR DEPRESSED SKULL FRACTURE  2009   with MRSA infection  . CRANIOTOMY     X 2  . HERNIA REPAIR     during childhood  . ORIF ANKLE FRACTURE Left 2012    There were no vitals filed for this visit.  Subjective Assessment - 11/18/17 1244    Subjective   Patient smiling and wife reports they are having a good week.    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  He suffered another fall in 2017.  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     Patient Stated Goals  to be able to use the right hand functionally    Currently in Pain?  No/denies    Multiple Pain Sites  No                   OT Treatments/Exercises (OP) -  11/18/17 1245      ADLs   ADL Comments  Patient seen for additional focus on handwriting skills with use of larger lined paper, larger built up grip pen, formulation of name both printed and script, able to also write therapist name, improving with legibility.  Requires cues for tripod grip onto pen and to hold closer to the end of the shaft.       Neurological Re-education Exercises   Other Exercises 1  Patient seen for graded pressure acitvities with use of cotton and alternating to marbles to pick up and place while modulating pinch patterns.  Patient working to pick up small dowel sticks from horizontal position on table to place into grid in a vertical position.  Patient continues to demo difficulty with picking up from table.  Use of tongs with cues to pick up items from table and move to container.               OT Education - 11/18/17 1244    Education provided  Yes    Education Details  graded pressure, handwriting  OT Long Term Goals - 11/04/17 1436      OT LONG TERM GOAL #1   Title  Patient will improve right hand function to hold utensil in right hand for self feeding with modified independence.     Baseline  unable to hold utensil at eval, now able to hold utensils and self feed with minimal spillage.    Time  12    Period  Weeks    Status  On-going      OT LONG TERM GOAL #2   Title  Pt will increase hand strength in right by 5# to be able to cut food with modified independence.    Baseline  unable at eval, able to cut meat with occasional assist at 30 visit.    Time  12    Period  Weeks    Status  On-going      OT LONG TERM GOAL #3   Title  Patient will demonstrate increased awareness and strategies to attend to right side to hold items without dropping items.    Baseline  drops items frequently, 30 visit-patient dropping objects less frequently and is more aware when holding objects in hand    Time  12    Period  Weeks    Status  On-going      OT  LONG TERM GOAL #4   Title  Patient will demonstrate independence in home exercise program for ROM, strength and coordination of right hand.    Baseline  no current program, modified independent with current program at 30 visit but continuing to add exercises.    Time  6    Period  Weeks    Status  On-going      OT LONG TERM GOAL #5   Title  Patient will demonstrate the ability to obtain items from right pocket 90% of the time.    Baseline  difficulty getting keys or coins out of right pocket, 30 visit update-able to demonstrate 75% of the time    Time  12    Period  Weeks    Status  On-going            Plan - 11/18/17 1245    Clinical Impression Statement  Patient demonstrates difficulty with picking small items from tabletop, able to pick up and take if handed items without difficulty.  Patient continues to work on legibility of writing his name and signing name for important papers. Patient continues to benefit from skilled OT to increase independence in daily tasks with use of right hand and arm.     Occupational Profile and client history currently impacting functional performance  repeated falls, TBI, expressive aphasia, limited motion in right hand, lack of coordination from previous CVA    Occupational performance deficits (Please refer to evaluation for details):  ADL's;IADL's;Leisure    Rehab Potential  Good    Current Impairments/barriers affecting progress:  expressive aphasia, falls, neglect    OT Frequency  2x / week    OT Duration  12 weeks    OT Treatment/Interventions  Self-care/ADL training;Visual/perceptual remediation/compensation;DME and/or AE instruction;Patient/family education;Moist Heat;Therapeutic exercise;Manual Therapy;Therapeutic activities;Neuromuscular education    Consulted and Agree with Plan of Care  Patient    Family Member Consulted  wife, [name]       Patient will benefit from skilled therapeutic intervention in order to improve the following  deficits and impairments:  Impaired vision/preception, Decreased coordination, Decreased safety awareness, Impaired UE functional use, Decreased strength  Visit Diagnosis: Muscle  weakness (generalized)  Other lack of coordination  Neurologic neglect syndrome    Problem List Patient Active Problem List   Diagnosis Date Noted  . Sleep disturbance   . PAF (paroxysmal atrial fibrillation) ([place])   . Recurrent UTI   . Hypokalemia   . Hyponatremia   . Abnormal urine   . 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 Benign essential HTN   . Urinary retention   . History of gout   . Global aphasia   . SAH (subarachnoid hemorrhage) ([place])   . SDH (subdural hematoma) ([place])   . Lymphocytosis   . Subdural hemorrhage following injury [facility]) 05/12/2016   Amy T Lovett, OTR/L, CLT  Lovett,Amy 11/18/2017, 12:52 PM  [place] MAIN [facility] SERVICES [address] [place], [place], [address] Phone: [phone]   Fax:  [phone]  Name: James [name] MRN: [id number] Date of Birth: 20-Sep-1940

## 2017-11-22 ENCOUNTER — Ambulatory Visit: Payer: PPO | Attending: Neurology | Admitting: Occupational Therapy

## 2017-11-22 DIAGNOSIS — R414 Neurologic neglect syndrome: Secondary | ICD-10-CM | POA: Insufficient documentation

## 2017-11-22 DIAGNOSIS — R278 Other lack of coordination: Secondary | ICD-10-CM

## 2017-11-22 DIAGNOSIS — M6281 Muscle weakness (generalized): Secondary | ICD-10-CM | POA: Diagnosis not present

## 2017-11-26 ENCOUNTER — Encounter: Payer: Self-pay | Admitting: Occupational Therapy

## 2017-11-26 NOTE — Therapy (Signed)
[place] MAIN [facility] SERVICES [address] [place], [place], [address] Phone: [phone]   Fax:  [phone]  Occupational Therapy Treatment  Patient Details  Name: James [name] MRN: [id number] Date of Birth: 04-28-1941 Referring Provider: Manuella Ghazi   Encounter Date: 11/22/2017  OT End of Session - 11/26/17 1740    Visit Number  34    Number of Visits  88    Date for OT Re-Evaluation  12/22/17    Authorization Type  Medicare    OT Start Time  0915    OT Stop Time  1000    OT Time Calculation (min)  45 min    Activity Tolerance  Patient tolerated treatment well    Behavior During Therapy  [facility] for tasks assessed/performed       Past Medical History:  Diagnosis Date  . Alcohol abuse, in remission   . Atrial fibrillation ([place])   . Bilateral renal masses   . Closed right ankle fracture   . 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 Gait disorder   . Gout   . Hypertension   . Seizure disorder ([place])   . 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     Past Surgical History:  Procedure Laterality Date  . CRANIECTOMY FOR DEPRESSED SKULL FRACTURE  2009   with MRSA infection  . CRANIOTOMY     X 2  . HERNIA REPAIR     during childhood  . ORIF ANKLE FRACTURE Left 2012    There were no vitals filed for this visit.  Subjective Assessment - 11/26/17 1739    Subjective   Patient smiling and ready to get started with therapy today.    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  He suffered another fall in 2017.  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     Patient Stated Goals  to be able to use the right hand functionally    Currently in Pain?  No/denies    Multiple Pain Sites  No                   OT Treatments/Exercises (OP) -  11/26/17 1827      Neurological Re-education Exercises   Other Exercises 1  Patient seen for manipulation of 100 pegboard/judi board with cues for prehension pattern with use of thumb, index and middle finger to pick up and flip object from one end to the other and place into board.  Patient completing all outside rows of the board.     Other Exercises 2  Patient seen for card exercise with emphasis on a variety of skills:  cues for setting up the board, therapist demo and cues for shuffling cards, dealing and holding cards with right hand.  Patient engaged in kings in the corner which advocates scanning and vision demands to right side.  Game also works towards taking turns, alternating colors and numbers of cards.  Will plan to instruct wife on how to perform at home with patient since he does well when engaged in meaningful task and likes to play cards with wife and friends.              OT Education - 11/26/17 1739    Education provided  Yes  Education Details  RUE grasping patterns, prehension patterns to pick up and manipulate objects    Person(s) Educated  Patient    Methods  Explanation;Demonstration;Verbal cues    Comprehension  Verbal cues required;Returned demonstration;Verbalized understanding          OT Long Term Goals - 11/04/17 1436      OT LONG TERM GOAL #1   Title  Patient will improve right hand function to hold utensil in right hand for self feeding with modified independence.     Baseline  unable to hold utensil at eval, now able to hold utensils and self feed with minimal spillage.    Time  12    Period  Weeks    Status  On-going      OT LONG TERM GOAL #2   Title  Pt will increase hand strength in right by 5# to be able to cut food with modified independence.    Baseline  unable at eval, able to cut meat with occasional assist at 30 visit.    Time  12    Period  Weeks    Status  On-going      OT LONG TERM GOAL #3   Title  Patient will demonstrate  increased awareness and strategies to attend to right side to hold items without dropping items.    Baseline  drops items frequently, 30 visit-patient dropping objects less frequently and is more aware when holding objects in hand    Time  12    Period  Weeks    Status  On-going      OT LONG TERM GOAL #4   Title  Patient will demonstrate independence in home exercise program for ROM, strength and coordination of right hand.    Baseline  no current program, modified independent with current program at 30 visit but continuing to add exercises.    Time  6    Period  Weeks    Status  On-going      OT LONG TERM GOAL #5   Title  Patient will demonstrate the ability to obtain items from right pocket 90% of the time.    Baseline  difficulty getting keys or coins out of right pocket, 30 visit update-able to demonstrate 75% of the time    Time  12    Period  Weeks    Status  On-going            Plan - 11/26/17 1740    Clinical Impression Statement  Patient was engaged in card task this date and was not aware when it was time to finish with therapy. He indicated he would like to learn to play a game with his wife at home and possibly with friends.  Wife to come in next session to learn game and what cues to provide to patient.  He was able to demonstrate ability to take turns, follow the rules.  Required assistance with higher level problem solving and scanning to move stacks from one place to another.  Able to hold cards this date with appropriate graded pressure and did not require cues for grasp.  Continue to work towards goals to increase right UE functional use and right sided awareness during daily tasks.    Occupational Profile and client history currently impacting functional performance  repeated falls, TBI, expressive aphasia, limited motion in right hand, lack of coordination from previous CVA    Occupational performance deficits (Please refer to evaluation for details):   ADL's;IADL's;Leisure  Rehab Potential  Good    Current Impairments/barriers affecting progress:  expressive aphasia, falls, neglect    OT Frequency  2x / week    OT Duration  12 weeks    OT Treatment/Interventions  Self-care/ADL training;Visual/perceptual remediation/compensation;DME and/or AE instruction;Patient/family education;Moist Heat;Therapeutic exercise;Manual Therapy;Therapeutic activities;Neuromuscular education    Consulted and Agree with Plan of Care  Patient       Patient will benefit from skilled therapeutic intervention in order to improve the following deficits and impairments:  Impaired vision/preception, Decreased coordination, Decreased safety awareness, Impaired UE functional use, Decreased strength  Visit Diagnosis: Muscle weakness (generalized)  Other lack of coordination  Neurologic neglect syndrome    Problem List Patient Active Problem List   Diagnosis Date Noted  . Sleep disturbance   . PAF (paroxysmal atrial fibrillation) ([place])   . Recurrent UTI   . Hypokalemia   . Hyponatremia   . Abnormal urine   . 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 Benign essential HTN   . Urinary retention   . History of gout   . Global aphasia   . SAH (subarachnoid hemorrhage) ([place])   . SDH (subdural hematoma) ([place])   . Lymphocytosis   . Subdural hemorrhage following injury [facility]) 05/12/2016   Amy T Lovett, OTR/L, CLT  Lovett,Amy 11/26/2017, 6:31 PM  [place] MAIN [facility] SERVICES [address] [place], [place], [address] Phone: [phone]   Fax:  [phone]  Name: James [name] MRN: [id number] Date of Birth: 1941/07/10

## 2017-11-29 ENCOUNTER — Ambulatory Visit: Payer: PPO | Admitting: Occupational Therapy

## 2017-11-29 DIAGNOSIS — M6281 Muscle weakness (generalized): Secondary | ICD-10-CM

## 2017-11-29 DIAGNOSIS — R414 Neurologic neglect syndrome: Secondary | ICD-10-CM

## 2017-11-29 DIAGNOSIS — R278 Other lack of coordination: Secondary | ICD-10-CM

## 2017-11-30 ENCOUNTER — Encounter: Payer: Self-pay | Admitting: Occupational Therapy

## 2017-11-30 NOTE — Therapy (Signed)
[place] MAIN [facility] SERVICES [address] [place], [place], [address] Phone: [phone]   Fax:  [phone]  Occupational Therapy Treatment  Patient Details  Name: James [name] MRN: [id number] Date of Birth: 1941-06-16 Referring Provider: Manuella Ghazi   Encounter Date: 11/29/2017  OT End of Session - 11/30/17 2242    Visit Number  35    Number of Visits  48    Date for OT Re-Evaluation  12/22/17    OT Start Time  0915    OT Stop Time  1000    OT Time Calculation (min)  45 min    Activity Tolerance  Patient tolerated treatment well    Behavior During Therapy  [facility] for tasks assessed/performed       Past Medical History:  Diagnosis Date  . Alcohol abuse, in remission   . Atrial fibrillation ([place])   . Bilateral renal masses   . Closed right ankle fracture   . 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 Gait disorder   . Gout   . Hypertension   . Seizure disorder ([place])   . 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     Past Surgical History:  Procedure Laterality Date  . CRANIECTOMY FOR DEPRESSED SKULL FRACTURE  2009   with MRSA infection  . CRANIOTOMY     X 2  . HERNIA REPAIR     during childhood  . ORIF ANKLE FRACTURE Left 2012    There were no vitals filed for this visit.  Subjective Assessment - 11/30/17 2240    Subjective   Wife present during session and would like to learn the tasks patient was instructed on last session.    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  He suffered another fall in 2017.  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     Patient Stated Goals  to be able to use the right hand functionally    Currently in Pain?  No/denies    Multiple Pain Sites  No          Patient instructed on multi sensory card  game involving taking turns, problem solving, vision and scanning especially to his right side, hand function, coordination in addition to other benefits.  Patient and wife able to demonstrate understanding of principles and rules and therapist provided cues to patient while wife was engaged in game with him. Patient required cues at times to initiate use of right hand into task, prehension patterns and higher level problem solving.  Patient and wife also instructed on thumb/finger combinations to manipulate items and with focus on prehension. Review and reinstruction on manipulation of small objects with right hand for HEP.                   OT Education - 11/30/17 2241    Education provided  Yes    Education Details  homework task with wife assisting for multi sensory task involving right hand use for holding, reaching and placing, vision and cognition    Person(s) Educated  Patient;Spouse    Methods  Explanation;Demonstration;Verbal cues    Comprehension  Verbal cues required;Returned demonstration;Verbalized understanding          OT Long Term Goals - 11/04/17 1436  OT LONG TERM GOAL #1   Title  Patient will improve right hand function to hold utensil in right hand for self feeding with modified independence.     Baseline  unable to hold utensil at eval, now able to hold utensils and self feed with minimal spillage.    Time  12    Period  Weeks    Status  On-going      OT LONG TERM GOAL #2   Title  Pt will increase hand strength in right by 5# to be able to cut food with modified independence.    Baseline  unable at eval, able to cut meat with occasional assist at 30 visit.    Time  12    Period  Weeks    Status  On-going      OT LONG TERM GOAL #3   Title  Patient will demonstrate increased awareness and strategies to attend to right side to hold items without dropping items.    Baseline  drops items frequently, 30 visit-patient dropping objects less  frequently and is more aware when holding objects in hand    Time  12    Period  Weeks    Status  On-going      OT LONG TERM GOAL #4   Title  Patient will demonstrate independence in home exercise program for ROM, strength and coordination of right hand.    Baseline  no current program, modified independent with current program at 30 visit but continuing to add exercises.    Time  6    Period  Weeks    Status  On-going      OT LONG TERM GOAL #5   Title  Patient will demonstrate the ability to obtain items from right pocket 90% of the time.    Baseline  difficulty getting keys or coins out of right pocket, 30 visit update-able to demonstrate 75% of the time    Time  12    Period  Weeks    Status  On-going            Plan - 11/30/17 2242    Clinical Impression Statement  Patient seen this date with wife present during session for education on home exercises to continue with over the next couple weeks.  Patient instructed on multi sensory card game involving taking turns, problem solving, vision and scanning especially to his right side, hand function, coordination in addition to other benefits.  Patient and wife able to demonstrate understanding of principles and rules and therapist provided cues to patient while wife was engaged in game with him.  They will plan to perform at home, wife aware of cues to provide at times and that patient has the most difficulty with identifying the need and moving stacks of cards.  Patient will take a 2 week break from therapy and implement exercises at home and will return after 2 weeks and will see if he was able to maintain progress  and will reassess skills at that time to determine further needs.     Occupational Profile and client history currently impacting functional performance  repeated falls, TBI, expressive aphasia, limited motion in right hand, lack of coordination from previous CVA    Occupational performance deficits (Please refer to  evaluation for details):  ADL's;IADL's;Leisure    Rehab Potential  Good    Current Impairments/barriers affecting progress:  expressive aphasia, falls, neglect    OT Frequency  2x / week    OT  Duration  12 weeks    OT Treatment/Interventions  Self-care/ADL training;Visual/perceptual remediation/compensation;DME and/or AE instruction;Patient/family education;Moist Heat;Therapeutic exercise;Manual Therapy;Therapeutic activities;Neuromuscular education    Consulted and Agree with Plan of Care  Patient    Family Member Consulted  wife, [name]       Patient will benefit from skilled therapeutic intervention in order to improve the following deficits and impairments:  Impaired vision/preception, Decreased coordination, Decreased safety awareness, Impaired UE functional use, Decreased strength  Visit Diagnosis: Muscle weakness (generalized)  Other lack of coordination  Neurologic neglect syndrome    Problem List Patient Active Problem List   Diagnosis Date Noted  . Sleep disturbance   . PAF (paroxysmal atrial fibrillation) ([place])   . Recurrent UTI   . Hypokalemia   . Hyponatremia   . Abnormal urine   . 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 Benign essential HTN   . Urinary retention   . History of gout   . Global aphasia   . SAH (subarachnoid hemorrhage) ([place])   . SDH (subdural hematoma) ([place])   . Lymphocytosis   . Subdural hemorrhage following injury [facility]) 05/12/2016   Allexa Acoff T Donnald Tabar, OTR/L, CLT  Yanelis Osika 11/30/2017, 10:51 PM  [place] MAIN [facility] SERVICES [address] [place], [place], [address] Phone: [phone]   Fax:  [phone]  Name: James [name] MRN: [id number] Date of Birth: 07/07/1941

## 2017-12-06 DIAGNOSIS — E538 Deficiency of other specified B group vitamins: Secondary | ICD-10-CM | POA: Diagnosis not present

## 2017-12-06 DIAGNOSIS — E7849 Other hyperlipidemia: Secondary | ICD-10-CM | POA: Diagnosis not present

## 2017-12-06 DIAGNOSIS — M1A09X0 Idiopathic chronic gout, multiple sites, without tophus (tophi): Secondary | ICD-10-CM | POA: Diagnosis not present

## 2017-12-06 DIAGNOSIS — I10 Essential (primary) hypertension: Secondary | ICD-10-CM | POA: Diagnosis not present

## 2017-12-06 DIAGNOSIS — D696 Thrombocytopenia, unspecified: Secondary | ICD-10-CM | POA: Diagnosis not present

## 2017-12-06 DIAGNOSIS — I2510 Atherosclerotic heart disease of native coronary artery without angina pectoris: Secondary | ICD-10-CM | POA: Diagnosis not present

## 2017-12-06 DIAGNOSIS — I481 Persistent atrial fibrillation: Secondary | ICD-10-CM | POA: Diagnosis not present

## 2017-12-13 DIAGNOSIS — K219 Gastro-esophageal reflux disease without esophagitis: Secondary | ICD-10-CM | POA: Diagnosis not present

## 2017-12-13 DIAGNOSIS — G40209 Localization-related (focal) (partial) symptomatic epilepsy and epileptic syndromes with complex partial seizures, not intractable, without status epilepticus: Secondary | ICD-10-CM | POA: Diagnosis not present

## 2017-12-13 DIAGNOSIS — F3342 Major depressive disorder, recurrent, in full remission: Secondary | ICD-10-CM | POA: Diagnosis not present

## 2017-12-13 DIAGNOSIS — I481 Persistent atrial fibrillation: Secondary | ICD-10-CM | POA: Diagnosis not present

## 2017-12-13 DIAGNOSIS — E7849 Other hyperlipidemia: Secondary | ICD-10-CM | POA: Diagnosis not present

## 2017-12-13 DIAGNOSIS — I2510 Atherosclerotic heart disease of native coronary artery without angina pectoris: Secondary | ICD-10-CM | POA: Diagnosis not present

## 2017-12-13 DIAGNOSIS — Z8673 Personal history of transient ischemic attack (TIA), and cerebral infarction without residual deficits: Secondary | ICD-10-CM | POA: Diagnosis not present

## 2017-12-13 DIAGNOSIS — M1A09X0 Idiopathic chronic gout, multiple sites, without tophus (tophi): Secondary | ICD-10-CM | POA: Diagnosis not present

## 2017-12-13 DIAGNOSIS — E538 Deficiency of other specified B group vitamins: Secondary | ICD-10-CM | POA: Diagnosis not present

## 2017-12-13 DIAGNOSIS — Z125 Encounter for screening for malignant neoplasm of prostate: Secondary | ICD-10-CM | POA: Diagnosis not present

## 2017-12-13 DIAGNOSIS — I10 Essential (primary) hypertension: Secondary | ICD-10-CM | POA: Diagnosis not present

## 2017-12-13 DIAGNOSIS — D696 Thrombocytopenia, unspecified: Secondary | ICD-10-CM | POA: Diagnosis not present

## 2017-12-14 DIAGNOSIS — R29898 Other symptoms and signs involving the musculoskeletal system: Secondary | ICD-10-CM | POA: Diagnosis not present

## 2017-12-14 DIAGNOSIS — S065X9D Traumatic subdural hemorrhage with loss of consciousness of unspecified duration, subsequent encounter: Secondary | ICD-10-CM | POA: Diagnosis not present

## 2017-12-14 DIAGNOSIS — I69320 Aphasia following cerebral infarction: Secondary | ICD-10-CM | POA: Diagnosis not present

## 2017-12-14 DIAGNOSIS — G40209 Localization-related (focal) (partial) symptomatic epilepsy and epileptic syndromes with complex partial seizures, not intractable, without status epilepticus: Secondary | ICD-10-CM | POA: Diagnosis not present

## 2017-12-20 ENCOUNTER — Ambulatory Visit: Payer: PPO | Admitting: Occupational Therapy

## 2017-12-20 DIAGNOSIS — M6281 Muscle weakness (generalized): Secondary | ICD-10-CM | POA: Diagnosis not present

## 2017-12-20 DIAGNOSIS — R414 Neurologic neglect syndrome: Secondary | ICD-10-CM

## 2017-12-20 DIAGNOSIS — R278 Other lack of coordination: Secondary | ICD-10-CM

## 2017-12-22 ENCOUNTER — Encounter: Payer: Self-pay | Admitting: Occupational Therapy

## 2017-12-22 NOTE — Therapy (Signed)
[place] MAIN [facility] SERVICES [address] [place], [place], [address] Phone: [phone]   Fax:  [phone]  Occupational Therapy Treatment/Reassessment   Patient Details  Name: James [name] MRN: [id number] Date of Birth: 1940/12/01 Referring Provider: Manuella Ghazi   Encounter Date: 12/20/2017  OT End of Session - 12/22/17 1605    Visit Number  36    Number of Visits  60    Date for OT Re-Evaluation  03/15/18    Authorization Type  Medicare    OT Start Time  0845    OT Stop Time  0940    OT Time Calculation (min)  55 min    Activity Tolerance  Patient tolerated treatment well    Behavior During Therapy  [facility] for tasks assessed/performed       Past Medical History:  Diagnosis Date  . Alcohol abuse, in remission   . Atrial fibrillation ([place])   . Bilateral renal masses   . Closed right ankle fracture   . 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 Gait disorder   . Gout   . Hypertension   . Seizure disorder ([place])   . 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     Past Surgical History:  Procedure Laterality Date  . CRANIECTOMY FOR DEPRESSED SKULL FRACTURE  2009   with MRSA infection  . CRANIOTOMY     X 2  . HERNIA REPAIR     during childhood  . ORIF ANKLE FRACTURE Left 2012    There were no vitals filed for this visit.  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  He suffered another fall in 2017.  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     Patient Stated Goals  to be able to use the right hand functionally    Currently in Pain?  No/denies    Multiple Pain Sites  No              Patient was seen for reassessment the state of skills and adjusting plan of care. Grip strength right 42  pounds, lateral pinch 19 pounds, three point pinch 18 pounds, two point Pinch 10 pounds. Patient was able to complete nine hole peg test for the first time ever. Completed in four minutes and 20 seconds with occasional dropping of pegs. Patient seen this date hand writing skills with introduction of three types tools. Patient appears to have the best legibility with red easy grill pan with built-up handle. Focused on picking up and manipulating objects 1 inch in size and progressing to half inch size objects with cues for prehension patterns and attempts with translatory skills of the hand              OT Education - 12/22/17 1605    Education provided  Yes    Education Details  plan of care, goals, use of adapted pens    Person(s) Educated  Patient;Spouse    Methods  Explanation;Demonstration;Verbal cues    Comprehension  Verbal cues required;Returned demonstration;Verbalized understanding          OT Long Term Goals - 12/22/17 1608      OT LONG TERM GOAL #1   Title  Patient will improve right hand  function to hold utensil in right hand for self feeding with modified independence.     Baseline  unable to hold utensil at eval, now able to hold utensils and self feed with minimal spillage.    Time  12    Period  Weeks    Status  Achieved      OT LONG TERM GOAL #2   Title  Pt will increase hand strength in right by 5# to be able to cut food with modified independence.    Baseline  unable at eval, able to cut meat with occasional assist at 30 visit.    Time  12    Period  Weeks    Status  Achieved      OT LONG TERM GOAL #3   Title  Patient will demonstrate increased awareness and strategies to attend to right side to hold items without dropping items.    Baseline  drops items frequently, 30 visit-patient dropping objects less frequently and is more aware when holding objects in hand    Time  12    Period  Weeks    Status  On-going      OT LONG TERM GOAL #4   Title   Patient will demonstrate independence in home exercise program for ROM, strength and coordination of right hand.    Baseline  no current program, modified independent with current program at 30 visit but continuing to add exercises.    Time  6    Period  Weeks    Status  On-going      OT LONG TERM GOAL #5   Title  Patient will demonstrate the ability to obtain items from right pocket 90% of the time.    Baseline  difficulty getting keys or coins out of right pocket, 30 visit update-able to demonstrate 75% of the time    Time  12    Period  Weeks    Status  On-going      Long Term Additional Goals   Additional Long Term Goals  Yes      OT LONG TERM GOAL #6   Title  Patient will demonstrate pouring a drink with the right hand with attention to right side and not knocking the cup over and spilling.      Baseline  patient often does not realize the cup is on his right side and knocks it over when attempting to get a drink    Time  12    Period  Weeks    Status  New    Target Date  03/15/18            Plan - 12/22/17 1606    Clinical Impression Statement  Patient was able to complete nine hole peg test for the first time ever. Completed in four minutes and 20 seconds with occasional dropping of pegs but has made excellent progress from evaluation when he could not pick up any items or complete test. He is now shaving himself independently with and electric razor, he is improving with self-feeding and does best when company is present. His wife occasionally still assist with cutting meat. He has difficulty with pouring a drink and often knocks over a cup neglecting to notice it on his right side. His hand writing has improved in legibility, holding a pen and use of modified writing devices for his name and address. He has shown improvements in attention to right side, ability to adjust graded pressure in his right hand man  reaching in grasping for items, and performing self-care activities  including self-feeding and shaving. He continues to benefit from skilled occupational therapy to further address right sided and attention/neglect, self-care activities, fine motor coordination, handwriting , graded pressure activities to facilitate active reach and grasp. Recommend he continue occupational therapy one time a week for 12 weeks to further work on the skills.     Occupational Profile and client history currently impacting functional performance  repeated falls, TBI, expressive aphasia, limited motion in right hand, lack of coordination from previous CVA    Occupational performance deficits (Please refer to evaluation for details):  ADL's;IADL's;Leisure    Rehab Potential  Good    Current Impairments/barriers affecting progress:  expressive aphasia, falls, neglect    OT Frequency  2x / week    OT Duration  12 weeks    OT Treatment/Interventions  Self-care/ADL training;Visual/perceptual remediation/compensation;DME and/or AE instruction;Patient/family education;Moist Heat;Therapeutic exercise;Manual Therapy;Therapeutic activities;Neuromuscular education    Consulted and Agree with Plan of Care  Patient    Family Member Consulted  wife, [name]       Patient will benefit from skilled therapeutic intervention in order to improve the following deficits and impairments:  Impaired vision/preception, Decreased coordination, Decreased safety awareness, Impaired UE functional use, Decreased strength  Visit Diagnosis: Muscle weakness (generalized)  Other lack of coordination  Neurologic neglect syndrome    Problem List Patient Active Problem List   Diagnosis Date Noted  . Sleep disturbance   . PAF (paroxysmal atrial fibrillation) ([place])   . Recurrent UTI   . Hypokalemia   . Hyponatremia   . Abnormal urine   . 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 Benign essential HTN   . Urinary retention   . History of gout   . Global aphasia   . SAH (subarachnoid hemorrhage)  ([place])   . SDH (subdural hematoma) ([place])   . Lymphocytosis   . Subdural hemorrhage following injury [facility]) 05/12/2016   Amy T Lovett, OTR/L, CLT  Lovett,Amy 12/22/2017, 4:39 PM  [place] MAIN [facility] SERVICES [address] [place], [place], [address] Phone: [phone]   Fax:  [phone]  Name: James [name] MRN: [id number] Date of Birth: 09-Jun-1941

## 2017-12-27 ENCOUNTER — Ambulatory Visit: Payer: PPO | Attending: Neurology | Admitting: Occupational Therapy

## 2017-12-27 DIAGNOSIS — M6281 Muscle weakness (generalized): Secondary | ICD-10-CM | POA: Diagnosis not present

## 2017-12-27 DIAGNOSIS — R414 Neurologic neglect syndrome: Secondary | ICD-10-CM | POA: Insufficient documentation

## 2017-12-27 DIAGNOSIS — R278 Other lack of coordination: Secondary | ICD-10-CM | POA: Insufficient documentation

## 2017-12-30 ENCOUNTER — Encounter: Payer: Self-pay | Admitting: Occupational Therapy

## 2017-12-30 NOTE — Therapy (Signed)
[place] MAIN [facility] SERVICES [address] [place], [place], [address] Phone: [phone]   Fax:  [phone]  Occupational Therapy Treatment  Patient Details  Name: James [name] MRN: [id number] Date of Birth: Sep 21, 1940 Referring Provider: Manuella Ghazi   Encounter Date: 12/27/2017  OT End of Session - 12/30/17 1001    Visit Number  37    Number of Visits  60    Date for OT Re-Evaluation  03/15/18    Authorization Type  Medicare    OT Start Time  0915    OT Stop Time  1000    OT Time Calculation (min)  45 min    Activity Tolerance  Patient tolerated treatment well    Behavior During Therapy  [facility] for tasks assessed/performed       Past Medical History:  Diagnosis Date  . Alcohol abuse, in remission   . Atrial fibrillation ([place])   . Bilateral renal masses   . Closed right ankle fracture   . 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 Gait disorder   . Gout   . Hypertension   . Seizure disorder ([place])   . 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     Past Surgical History:  Procedure Laterality Date  . CRANIECTOMY FOR DEPRESSED SKULL FRACTURE  2009   with MRSA infection  . CRANIOTOMY     X 2  . HERNIA REPAIR     during childhood  . ORIF ANKLE FRACTURE Left 2012    There were no vitals filed for this visit.  Subjective Assessment - 12/30/17 1000    Subjective   Patient denies any pain and ready to get started with session.    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  He suffered another fall in 2017.  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     Patient Stated Goals  to be able to use the right hand functionally    Currently in Pain?  No/denies    Multiple Pain Sites  No                   OT Treatments/Exercises (OP) -  12/30/17 1121      Neurological Re-education Exercises   Other Exercises 1  Patient seen for resistive Pinch pins placing onto elevated surface from seated position with cues for modulation of graded pressure based on level of resistance of the pins.  Patient able to complete all levels of pinch from yellow to black.  Manipulation of ball pegs from container with moderate difficult to pick up and turn item to place into board.  Patient able to complete with greater ease if therapist holds object and patient takes it from therapist which requires decreased manipulation skills. Patient requires cues for graded pressure and to turn and manipulate, dropping of item frequently with right hand.              OT Education - 12/30/17 1001    Education provided  Yes    Education Details  handwriting, graded pressure    Person(s) Educated  Patient;Spouse    Methods  Explanation;Demonstration;Verbal cues    Comprehension  Verbal cues required;Returned demonstration;Verbalized understanding          OT Long Term Goals - 12/22/17  [place] #1   Title  Patient will improve right hand function to hold utensil in right hand for self feeding with modified independence.     Baseline  unable to hold utensil at eval, now able to hold utensils and self feed with minimal spillage.    Time  12    Period  Weeks    Status  Achieved      OT LONG TERM GOAL #2   Title  Pt will increase hand strength in right by 5# to be able to cut food with modified independence.    Baseline  unable at eval, able to cut meat with occasional assist at 30 visit.    Time  12    Period  Weeks    Status  Achieved      OT LONG TERM GOAL #3   Title  Patient will demonstrate increased awareness and strategies to attend to right side to hold items without dropping items.    Baseline  drops items frequently, 30 visit-patient dropping objects less frequently and is more aware when holding objects in hand     Time  12    Period  Weeks    Status  On-going      OT LONG TERM GOAL #4   Title  Patient will demonstrate independence in home exercise program for ROM, strength and coordination of right hand.    Baseline  no current program, modified independent with current program at 30 visit but continuing to add exercises.    Time  6    Period  Weeks    Status  On-going      OT LONG TERM GOAL #5   Title  Patient will demonstrate the ability to obtain items from right pocket 90% of the time.    Baseline  difficulty getting keys or coins out of right pocket, 30 visit update-able to demonstrate 75% of the time    Time  12    Period  Weeks    Status  On-going      Long Term Additional Goals   Additional Long Term Goals  Yes      OT LONG TERM GOAL #6   Title  Patient will demonstrate pouring a drink with the right hand with attention to right side and not knocking the cup over and spilling.      Baseline  patient often does not realize the cup is on his right side and knocks it over when attempting to get a drink    Time  12    Period  Weeks    Status  New    Target Date  03/15/18            Plan - 12/30/17 1001    Clinical Impression Statement  Patient able to complete picking up and manipulating with greater ease if therapist holds object and patient takes it from therapist which requires decreased manipulation skills. Patient requires cues for graded pressure and to turn and manipulate, dropping of item frequently with right hand. Patient continues to work towards modulation of graded pressure and attention to right side.  Will continue to adjust and add to home program each week.     Occupational Profile and client history currently impacting functional performance  repeated falls, TBI, expressive aphasia, limited motion in right hand, lack of coordination from previous CVA    Occupational performance deficits (Please refer to evaluation for details):  ADL's;IADL's;Leisure  Rehab  Potential  Good    Current Impairments/barriers affecting progress:  expressive aphasia, falls, neglect    OT Frequency  2x / week    OT Duration  12 weeks    OT Treatment/Interventions  Self-care/ADL training;Visual/perceptual remediation/compensation;DME and/or AE instruction;Patient/family education;Moist Heat;Therapeutic exercise;Manual Therapy;Therapeutic activities;Neuromuscular education    Consulted and Agree with Plan of Care  Patient       Patient will benefit from skilled therapeutic intervention in order to improve the following deficits and impairments:  Impaired vision/preception, Decreased coordination, Decreased safety awareness, Impaired UE functional use, Decreased strength  Visit Diagnosis: Muscle weakness (generalized)  Other lack of coordination  Neurologic neglect syndrome    Problem List Patient Active Problem List   Diagnosis Date Noted  . Sleep disturbance   . PAF (paroxysmal atrial fibrillation) ([place])   . Recurrent UTI   . Hypokalemia   . Hyponatremia   . Abnormal urine   . 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 Benign essential HTN   . Urinary retention   . History of gout   . Global aphasia   . SAH (subarachnoid hemorrhage) ([place])   . SDH (subdural hematoma) ([place])   . Lymphocytosis   . Subdural hemorrhage following injury ([place]) 05/12/2016   James [name] T James [name], OTR/L, CLT  James [name] 12/30/2017, 11:24 AM  [place] MAIN [facility] SERVICES [address] [place], [place], [address] Phone: [phone]   Fax:  [phone]  Name: James [name] MRN: [id number] Date of Birth: [birth year]

## 2018-01-03 ENCOUNTER — Ambulatory Visit: Payer: PPO | Admitting: Occupational Therapy

## 2018-01-03 DIAGNOSIS — M6281 Muscle weakness (generalized): Secondary | ICD-10-CM | POA: Diagnosis not present

## 2018-01-03 DIAGNOSIS — R278 Other lack of coordination: Secondary | ICD-10-CM

## 2018-01-03 DIAGNOSIS — R414 Neurologic neglect syndrome: Secondary | ICD-10-CM

## 2018-01-08 ENCOUNTER — Encounter: Payer: Self-pay | Admitting: Occupational Therapy

## 2018-01-08 NOTE — Therapy (Signed)
[place] MAIN [facility] SERVICES [address] [place], [place], [address] Phone: [phone]   Fax:  [phone]  Occupational Therapy Treatment  Patient Details  Name: James [name] MRN: [id number] Date of Birth: [birth year] Referring Provider: Manuella Ghazi   Encounter Date: 01/03/2018  OT End of Session - 01/08/18 2021    Visit Number  38    Number of Visits  60    Date for OT Re-Evaluation  03/15/18    OT Start Time  0845    OT Stop Time  0931    OT Time Calculation (min)  46 min    Activity Tolerance  Patient tolerated treatment well    Behavior During Therapy  [facility] for tasks assessed/performed       Past Medical History:  Diagnosis Date  . Alcohol abuse, in remission   . Atrial fibrillation ([place])   . Bilateral renal masses   . Closed right ankle fracture   . 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 Gait disorder   . Gout   . Hypertension   . Seizure disorder ([place])   . 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     Past Surgical History:  Procedure Laterality Date  . CRANIECTOMY FOR DEPRESSED SKULL FRACTURE  2009   with MRSA infection  . CRANIOTOMY     X 2  . HERNIA REPAIR     during childhood  . ORIF ANKLE FRACTURE Left 2012    There were no vitals filed for this visit.  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  He suffered another fall in 2017.  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     Patient Stated Goals  to be able to use the right hand functionally    Currently in Pain?  No/denies    Multiple Pain Sites  No                    OT Treatments/Exercises (OP) - 01/08/18 2024      ADLs   Writing  Patient seen this date for focus on handwriting skills with right hand with use of small built up pen he brought from home which he has been practicing with and prefers. Advanced to smaller lined paper, writing name in print and script, address with cues for letter size and grasp on pen.  Tends to hold further up the pen instead of towards the end.       Neurological Re-education Exercises   Other Exercises 1  Patient seen for fine motor coordination skills with use of small washers and placing onto dowel stick, holding washer with thumb and index like a disc.  Increased difficulty with this pattern due to small surface area and graded pressure instability.  Instructed patient and wife to work on at home and can use coins holding in the same pattern.  [place] discs to flip from one side to the other with cues for technique.  Patient required therapist demo along with verbal and tactile cues for technique.  OT Education - 01/08/18 2020    Education provided  Yes    Education Details  handwriting with small grip pen he brought from home, lined paper use for homework    Person(s) Educated  Patient;Spouse    Methods  Explanation;Demonstration;Verbal cues    Comprehension  Verbal cues required;Returned demonstration;Verbalized understanding          OT Long Term Goals - 12/22/17 1608      OT LONG TERM GOAL #1   Title  Patient will improve right hand function to hold utensil in right hand for self feeding with modified independence.     Baseline  unable to hold utensil at eval, now able to hold utensils and self feed with minimal spillage.    Time  12    Period  Weeks    Status  Achieved      OT LONG TERM GOAL #2   Title  Pt will increase hand strength in right by 5# to be able to cut food with modified independence.    Baseline  unable at eval, able to cut meat with  occasional assist at 30 visit.    Time  12    Period  Weeks    Status  Achieved      OT LONG TERM GOAL #3   Title  Patient will demonstrate increased awareness and strategies to attend to right side to hold items without dropping items.    Baseline  drops items frequently, 30 visit-patient dropping objects less frequently and is more aware when holding objects in hand    Time  12    Period  Weeks    Status  On-going      OT LONG TERM GOAL #4   Title  Patient will demonstrate independence in home exercise program for ROM, strength and coordination of right hand.    Baseline  no current program, modified independent with current program at 30 visit but continuing to add exercises.    Time  6    Period  Weeks    Status  On-going      OT LONG TERM GOAL #5   Title  Patient will demonstrate the ability to obtain items from right pocket 90% of the time.    Baseline  difficulty getting keys or coins out of right pocket, 30 visit update-able to demonstrate 75% of the time    Time  12    Period  Weeks    Status  On-going      Long Term Additional Goals   Additional Long Term Goals  Yes      OT LONG TERM GOAL #6   Title  Patient will demonstrate pouring a drink with the right hand with attention to right side and not knocking the cup over and spilling.      Baseline  patient often does not realize the cup is on his right side and knocks it over when attempting to get a drink    Time  12    Period  Weeks    Status  New    Target Date  03/15/18            Plan - 01/08/18 2021    Clinical Impression Statement  Patient continues to make good progress with handwriting skills and has advanced to smaller lined paper, small built up pen and demonstrates improved legibility.  His wife reports patient has be consistent with working on this at home over the last week.  Issued  HEP with manipulation of washers or coins holding with thumb finger combo with oppositional hold. Patient demonstrates  difficulty with graded pressure and needs to work on more at home over the next week.  Continues to demonstrate motor planning difficulties but has improved and requiring decreased frequency of cues/    Occupational Profile and client history currently impacting functional performance  repeated falls, TBI, expressive aphasia, limited motion in right hand, lack of coordination from previous CVA    Occupational performance deficits (Please refer to evaluation for details):  ADL's;IADL's;Leisure    Rehab Potential  Good    Current Impairments/barriers affecting progress:  expressive aphasia, falls, neglect    OT Frequency  2x / week    OT Duration  12 weeks    OT Treatment/Interventions  Self-care/ADL training;Visual/perceptual remediation/compensation;DME and/or AE instruction;Patient/family education;Moist Heat;Therapeutic exercise;Manual Therapy;Therapeutic activities;Neuromuscular education    Consulted and Agree with Plan of Care  Patient    Family Member Consulted  wife, [name]       Patient will benefit from skilled therapeutic intervention in order to improve the following deficits and impairments:  Impaired vision/preception, Decreased coordination, Decreased safety awareness, Impaired UE functional use, Decreased strength  Visit Diagnosis: Muscle weakness (generalized)  Other lack of coordination  Neurologic neglect syndrome    Problem List Patient Active Problem List   Diagnosis Date Noted  . Sleep disturbance   . PAF (paroxysmal atrial fibrillation) ([place])   . Recurrent UTI   . Hypokalemia   . Hyponatremia   . Abnormal urine   . 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 Benign essential HTN   . Urinary retention   . History of gout   . Global aphasia   . SAH (subarachnoid hemorrhage) ([place])   . SDH (subdural hematoma) ([place])   . Lymphocytosis   . Subdural hemorrhage following injury [facility]) 05/12/2016   Sabeen Piechocki T Laylah Riga, OTR/L, CLT  Jerrica Thorman 01/08/2018, 8:32  PM  [place] MAIN [facility] SERVICES [address] [place], [place], [address] Phone: [phone]   Fax:  [phone]  Name: Lazarius Rivkin MRN: [id number] Date of Birth: Jun 29, 1941

## 2018-01-09 DIAGNOSIS — R040 Epistaxis: Secondary | ICD-10-CM | POA: Diagnosis not present

## 2018-01-10 ENCOUNTER — Ambulatory Visit: Payer: PPO | Admitting: Occupational Therapy

## 2018-01-10 DIAGNOSIS — R278 Other lack of coordination: Secondary | ICD-10-CM

## 2018-01-10 DIAGNOSIS — M6281 Muscle weakness (generalized): Secondary | ICD-10-CM | POA: Diagnosis not present

## 2018-01-10 DIAGNOSIS — R414 Neurologic neglect syndrome: Secondary | ICD-10-CM

## 2018-01-11 ENCOUNTER — Encounter: Payer: Self-pay | Admitting: Occupational Therapy

## 2018-01-11 DIAGNOSIS — J340 Abscess, furuncle and carbuncle of nose: Secondary | ICD-10-CM | POA: Diagnosis not present

## 2018-01-11 DIAGNOSIS — R040 Epistaxis: Secondary | ICD-10-CM | POA: Diagnosis not present

## 2018-01-11 NOTE — Therapy (Signed)
[place] MAIN [facility] SERVICES [address] [place], [place], [address] Phone: [phone]   Fax:  [phone]  Occupational Therapy Treatment  Patient Details  Name: James [name] MRN: [id number] Date of Birth: 12-05-1940 Referring Provider: Manuella Ghazi   Encounter Date: 01/10/2018  OT End of Session - 01/11/18 1759    Visit Number  39    Number of Visits  60    Date for OT Re-Evaluation  03/15/18    OT Start Time  0844    OT Stop Time  0930    OT Time Calculation (min)  46 min    Activity Tolerance  Patient tolerated treatment well    Behavior During Therapy  [facility] for tasks assessed/performed       Past Medical History:  Diagnosis Date  . Alcohol abuse, in remission   . Atrial fibrillation ([place])   . Bilateral renal masses   . Closed right ankle fracture   . 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 Gait disorder   . Gout   . Hypertension   . Seizure disorder ([place])   . 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     Past Surgical History:  Procedure Laterality Date  . CRANIECTOMY FOR DEPRESSED SKULL FRACTURE  2009   with MRSA infection  . CRANIOTOMY     X 2  . HERNIA REPAIR     during childhood  . ORIF ANKLE FRACTURE Left 2012    There were no vitals filed for this visit.  Subjective Assessment - 01/11/18 1754    Subjective   Patient indicating he has been working on homework and wants to continue with therapy for the next month. Wife is pleased that patient is willing to work and she is happy seeing patient make progress.     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  He suffered another fall in 2017.  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     Patient Stated Goals  to be able to use  the right hand functionally    Currently in Pain?  No/denies    Multiple Pain Sites  No                   OT Treatments/Exercises (OP) - 01/11/18 1805      Neurological Re-education Exercises   Other Exercises 1  Patient seen for manipulation of nuts and bolts of varying sizes, performs best with larger nuts to be able to hold with secure grip and balance graded pressure, increased difficulty with larger ones.  Performed on elevated surface with assist to get nuts started on bolts, able to place washers without difficulty this date. Attempted manipulation of small snap bead, patient demonstrates mod to max difficulty but is able to complete a few if several are already started in the string.     Other Exercises 2  Patient seen for reaching patterns with right hand and placing items to right side.               OT Education - 01/11/18 1759    Education provided  Yes    Education Details  exercises for home for fine motor coordination skills    Person(s)  Educated  Patient;Spouse    Methods  Explanation;Demonstration;Verbal cues    Comprehension  Verbal cues required;Returned demonstration;Verbalized understanding          OT Long Term Goals - 12/22/17 1608      OT LONG TERM GOAL #1   Title  Patient will improve right hand function to hold utensil in right hand for self feeding with modified independence.     Baseline  unable to hold utensil at eval, now able to hold utensils and self feed with minimal spillage.    Time  12    Period  Weeks    Status  Achieved      OT LONG TERM GOAL #2   Title  Pt will increase hand strength in right by 5# to be able to cut food with modified independence.    Baseline  unable at eval, able to cut meat with occasional assist at 30 visit.    Time  12    Period  Weeks    Status  Achieved      OT LONG TERM GOAL #3   Title  Patient will demonstrate increased awareness and strategies to attend to right side to hold items without  dropping items.    Baseline  drops items frequently, 30 visit-patient dropping objects less frequently and is more aware when holding objects in hand    Time  12    Period  Weeks    Status  On-going      OT LONG TERM GOAL #4   Title  Patient will demonstrate independence in home exercise program for ROM, strength and coordination of right hand.    Baseline  no current program, modified independent with current program at 30 visit but continuing to add exercises.    Time  6    Period  Weeks    Status  On-going      OT LONG TERM GOAL #5   Title  Patient will demonstrate the ability to obtain items from right pocket 90% of the time.    Baseline  difficulty getting keys or coins out of right pocket, 30 visit update-able to demonstrate 75% of the time    Time  12    Period  Weeks    Status  On-going      Long Term Additional Goals   Additional Long Term Goals  Yes      OT LONG TERM GOAL #6   Title  Patient will demonstrate pouring a drink with the right hand with attention to right side and not knocking the cup over and spilling.      Baseline  patient often does not realize the cup is on his right side and knocks it over when attempting to get a drink    Time  12    Period  Weeks    Status  New    Target Date  03/15/18            Plan - 01/11/18 1802    Clinical Impression Statement  Patient demonstrates difficulty with holding and manipulation of small nuts to place on bolt.  If given a larger nut and bolt combo, he is able to complete.  For the medium to small nuts, he requires assist to thread onto bolt.  Patient with moderate to max difficulty with small snap beads and will need a larger surface area to work with.  Instructed patient and wife regarding exercises and size of nuts and bolts to work with at  home for home program.  Patient indicates seeing progress and is willing to continue therapy for another month, one time a week.  Wife is pleased and can see definitive  progress at home and when they are out for community events. Plan to address tool use next session with right hand.    Occupational Profile and client history currently impacting functional performance  repeated falls, TBI, expressive aphasia, limited motion in right hand, lack of coordination from previous CVA    Occupational performance deficits (Please refer to evaluation for details):  ADL's;IADL's;Leisure    Rehab Potential  Good    Current Impairments/barriers affecting progress:  expressive aphasia, falls, neglect    OT Frequency  2x / week    OT Duration  12 weeks    OT Treatment/Interventions  Self-care/ADL training;Visual/perceptual remediation/compensation;DME and/or AE instruction;Patient/family education;Moist Heat;Therapeutic exercise;Manual Therapy;Therapeutic activities;Neuromuscular education    Consulted and Agree with Plan of Care  Patient       Patient will benefit from skilled therapeutic intervention in order to improve the following deficits and impairments:  Impaired vision/preception, Decreased coordination, Decreased safety awareness, Impaired UE functional use, Decreased strength  Visit Diagnosis: Muscle weakness (generalized)  Other lack of coordination  Neurologic neglect syndrome    Problem List Patient Active Problem List   Diagnosis Date Noted  . Sleep disturbance   . PAF (paroxysmal atrial fibrillation) ([place])   . Recurrent UTI   . Hypokalemia   . Hyponatremia   . Abnormal urine   . 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 Benign essential HTN   . Urinary retention   . History of gout   . Global aphasia   . SAH (subarachnoid hemorrhage) ([place])   . SDH (subdural hematoma) ([place])   . Lymphocytosis   . Subdural hemorrhage following injury ([place]) 05/12/2016   Randall Rampersad T Rodell Marrs, OTR/L, CLT  Jenet Durio 01/11/2018, 6:13 PM  [place] MAIN [facility] SERVICES [address] [place], [place], [address] Phone:  [phone]   Fax:  [phone]  Name: Quindell Shere MRN: [id number] Date of Birth: Jul 17, 1941

## 2018-01-16 ENCOUNTER — Encounter: Payer: Self-pay | Admitting: Occupational Therapy

## 2018-01-16 ENCOUNTER — Ambulatory Visit: Payer: PPO | Admitting: Occupational Therapy

## 2018-01-16 DIAGNOSIS — M6281 Muscle weakness (generalized): Secondary | ICD-10-CM

## 2018-01-16 DIAGNOSIS — R278 Other lack of coordination: Secondary | ICD-10-CM

## 2018-01-16 DIAGNOSIS — R414 Neurologic neglect syndrome: Secondary | ICD-10-CM

## 2018-01-17 NOTE — Therapy (Signed)
[place] MAIN [facility] SERVICES [address] [place], [place], [address] Phone: [phone]   Fax:  [phone]  Occupational Therapy Treatment/Progress Update Period from 10/30/17 to 01/16/2018  Patient Details  Name: James [name] MRN: [id number] Date of Birth: [birth year] Referring Provider: Manuella Ghazi   Encounter Date: 01/16/2018  OT End of Session - 01/16/18 1415    Visit Number  40    Number of Visits  60    Date for OT Re-Evaluation  03/15/18    OT Start Time  1300    OT Stop Time  1345    OT Time Calculation (min)  45 min    Activity Tolerance  Patient tolerated treatment well    Behavior During Therapy  [facility] for tasks assessed/performed       Past Medical History:  Diagnosis Date  . Alcohol abuse, in remission   . Atrial fibrillation ([place])   . Bilateral renal masses   . Closed right ankle fracture   . 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 Gait disorder   . Gout   . Hypertension   . Seizure disorder ([place])   . 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     Past Surgical History:  Procedure Laterality Date  . CRANIECTOMY FOR DEPRESSED SKULL FRACTURE  2009   with MRSA infection  . CRANIOTOMY     X 2  . HERNIA REPAIR     during childhood  . ORIF ANKLE FRACTURE Left 2012    There were no vitals filed for this visit.  Subjective Assessment - 01/16/18 1414    Subjective   Patient has been working more at home with his handwriting, continued last week with manipulation of larger nuts and bolts.     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  He suffered another fall in 2017.  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     Patient Stated Goals  to be able to use the right hand functionally    Currently in Pain?  No/denies    Multiple  Pain Sites  No                   OT Treatments/Exercises (OP) - 01/17/18 0001      ADLs   ADL Comments  Patient seen for the ability to obtain items from his right pocket, he is able to retrieve his keys with increased time allowed, he completed multiple trials with occasional cues.  Patient able to place coins into right pocket but has more difficulty attempting to remove them due to lack of sensation.  When attempting to get them out, he tends to pull the pocket liner inside out and the change falls all over the floor.  Multiple trials with variations completed.  Recommended to patient and wife he consider a coin purse, we explored a couple of options regarding different openings and closures and which may be best for him.  His wife is planning to purchase one soon.       Neurological Re-education Exercises   Other Exercises 1  Patient seen for emphasis on picking up objects with thumb and middle finger today with verbal and tactile cues and attempting to use right index finger to help move item to change direction.  Instructed on how to work on this at home and the use of a ink pen with click top- holding between thumb and MF and using index to click the top of the pen retract/protract the ink for writing.               OT Education - 01/16/18 1414    Education provided  Yes    Education Details  exercises for home for fine motor coordination skills, upgraded weekly    Person(s) Educated  Patient;Spouse    Methods  Explanation;Demonstration;Verbal cues    Comprehension  Verbal cues required;Returned demonstration;Verbalized understanding          OT Long Term Goals - 01/16/18 1416      OT LONG TERM GOAL #1   Title  Patient will improve right hand function to hold utensil in right hand for self feeding with modified independence.     Baseline  unable to hold utensil at eval, now able to hold utensils and self feed with minimal spillage.    Time  12    Period  Weeks     Status  Achieved      OT LONG TERM GOAL #2   Title  Pt will increase hand strength in right by 5# to be able to cut food with modified independence.    Baseline  unable at eval, able to cut meat with occasional assist at 30 visit.    Time  12    Period  Weeks    Status  Achieved      OT LONG TERM GOAL #3   Title  Patient will demonstrate increased awareness and strategies to attend to right side to hold items without dropping items.    Baseline  drops items frequently, 30 visit-patient dropping objects less frequently and is more aware when holding objects in hand    Time  12    Period  Weeks    Status  On-going      OT LONG TERM GOAL #4   Title  Patient will demonstrate independence in home exercise program for ROM, strength and coordination of right hand.    Baseline  no current program, modified independent with current program at 30 visit but continuing to add exercises.    Time  6    Period  Weeks    Status  On-going      OT LONG TERM GOAL #5   Title  Patient will demonstrate the ability to obtain items from right pocket 90% of the time.    Baseline  difficulty getting keys or coins out of right pocket, 30 visit update-able to demonstrate 75% of the time    Time  12    Period  Weeks    Status  On-going      OT LONG TERM GOAL #6   Title  Patient will demonstrate pouring a drink with the right hand with attention to right side and not knocking the cup over and spilling.      Baseline  patient often does not realize the cup is on his right side and knocks it over when attempting to get a drink    Time  12    Period  Weeks    Status  New            Plan - 01/16/18 1415    Clinical Impression Statement  Patient continues to work towards increased awareness of right side with reaching for items, manipulation of items with a  variety of prehension patterns to pick up, hold, flip and turn items.  He continues to have difficulty with managing money manipulation if using  right pocket and recommended a coin purse for the right pocket.  He continues to benefit from skilled OT to maximize his safety and independence in daily tasks.     Occupational Profile and client history currently impacting functional performance  repeated falls, TBI, expressive aphasia, limited motion in right hand, lack of coordination from previous CVA    Occupational performance deficits (Please refer to evaluation for details):  ADL's;IADL's;Leisure    Rehab Potential  Good    Current Impairments/barriers affecting progress:  expressive aphasia, falls, neglect    OT Frequency  2x / week    OT Duration  12 weeks    OT Treatment/Interventions  Self-care/ADL training;Visual/perceptual remediation/compensation;DME and/or AE instruction;Patient/family education;Moist Heat;Therapeutic exercise;Manual Therapy;Therapeutic activities;Neuromuscular education    Consulted and Agree with Plan of Care  Patient    Family Member Consulted  wife, [name]       Patient will benefit from skilled therapeutic intervention in order to improve the following deficits and impairments:  Impaired vision/preception, Decreased coordination, Decreased safety awareness, Impaired UE functional use, Decreased strength  Visit Diagnosis: Muscle weakness (generalized)  Other lack of coordination  Neurologic neglect syndrome    Problem List Patient Active Problem List   Diagnosis Date Noted  . Sleep disturbance   . PAF (paroxysmal atrial fibrillation) ([place])   . Recurrent UTI   . Hypokalemia   . Hyponatremia   . Abnormal urine   . 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 Benign essential HTN   . Urinary retention   . History of gout   . Global aphasia   . SAH (subarachnoid hemorrhage) ([place])   . SDH (subdural hematoma) ([place])   . Lymphocytosis   . Subdural hemorrhage following injury ([place]) 05/12/2016   Bertis Hustead T Chrisangel Eskenazi, OTR/L, CLT  Onika Gudiel 01/17/2018, 9:08 AM  [place] MAIN [facility] SERVICES [address] [place], [place], [address] Phone: [phone]   Fax:  [phone]  Name: Yitzchok Carriger MRN: [id number] Date of Birth: [birth year]

## 2018-01-23 ENCOUNTER — Ambulatory Visit: Payer: PPO | Attending: Neurology | Admitting: Occupational Therapy

## 2018-01-23 DIAGNOSIS — R414 Neurologic neglect syndrome: Secondary | ICD-10-CM | POA: Diagnosis not present

## 2018-01-23 DIAGNOSIS — M6281 Muscle weakness (generalized): Secondary | ICD-10-CM

## 2018-01-23 DIAGNOSIS — R278 Other lack of coordination: Secondary | ICD-10-CM | POA: Diagnosis not present

## 2018-01-24 ENCOUNTER — Encounter: Payer: Self-pay | Admitting: Occupational Therapy

## 2018-01-24 NOTE — Therapy (Signed)
[place] MAIN [facility] SERVICES [address] [place], [place], [address] Phone: [phone]   Fax:  [phone]  Occupational Therapy Treatment  Patient Details  Name: James [name] MRN: [id number] Date of Birth: 1941/06/14 Referring Provider: Manuella Ghazi   Encounter Date: 01/23/2018  OT End of Session - 01/24/18 1054    Visit Number  41    Number of Visits  60    Date for OT Re-Evaluation  03/15/18    Authorization Type  Medicare    OT Start Time  1259    OT Stop Time  1349    OT Time Calculation (min)  50 min    Activity Tolerance  Patient tolerated treatment well    Behavior During Therapy  [facility] for tasks assessed/performed       Past Medical History:  Diagnosis Date  . Alcohol abuse, in remission   . Atrial fibrillation ([place])   . Bilateral renal masses   . Closed right ankle fracture   . 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 Gait disorder   . Gout   . Hypertension   . Seizure disorder ([place])   . 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     Past Surgical History:  Procedure Laterality Date  . CRANIECTOMY FOR DEPRESSED SKULL FRACTURE  2009   with MRSA infection  . CRANIOTOMY     X 2  . HERNIA REPAIR     during childhood  . ORIF ANKLE FRACTURE Left 2012    There were no vitals filed for this visit.  Subjective Assessment - 01/24/18 1052    Subjective   Patient indicating he has been working on handwriting again at home and brought in pen to try today.  He was able to demonstrate the exercise I gave him last week with isolation of the index finger with clicking the pen.     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  He suffered another fall in 2017.  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     Patient Stated Goals  to be able to  use the right hand functionally    Currently in Pain?  No/denies    Multiple Pain Sites  No         Patient seen for Handwriting skills utilizing pen patient brought from home with medium sized built up handle and click button top.  Patient's legibility was not as good today compared to other days for his name in script.  Returned to pen we have used in the clinic in the past and it improved slightly.  Patient's first name becoming more legible over time.  Cues for tripod grasp, pt tends to hold pen further up the shaft and does not have good control with this finger position.   Patient seen for PVC pipe copy design this date with emphasis on attention to drawing/schematic placed on right side, occasional cues to scan all the way to the right, coordination of attaching pieces together and reaching tasks to obtain pieces and place into an elevated design.  He required 2 cues for switching out 2 pieces, one to make a longer piece and another for a fitter to be turned a different direction.  Patient seen for use of tool bench with focus on  tool use of Allen wrench with larger nuts and bolts, cues required and some difficulty noted.  This task requires proprioception of one hand under the bench to stabilize the nut and the other hand can be on top using the wrench to turn.  With decreased sensation, patient has to stabilize the nut with the left hand underneath the bench (no vision utilized, proprioception only) and then right hand for tool use with moderate difficulty.                     OT Education - 01/24/18 1054    Education provided  Yes    Education Details  isolated right index finger exercises for home    Person(s) Educated  Patient;Spouse    Methods  Explanation;Demonstration;Verbal cues    Comprehension  Verbal cues required;Returned demonstration;Verbalized understanding          OT Long Term Goals - 01/17/18 0909      OT LONG TERM GOAL #1   Title  Patient will  improve right hand function to hold utensil in right hand for self feeding with modified independence.     Baseline  unable to hold utensil at eval, now able to hold utensils and self feed with minimal spillage.    Time  12    Period  Weeks    Status  Achieved      OT LONG TERM GOAL #2   Title  Pt will increase hand strength in right by 5# to be able to cut food with modified independence.    Baseline  unable at eval, able to cut meat with occasional assist at 30 visit.    Time  12    Period  Weeks    Status  Achieved      OT LONG TERM GOAL #3   Title  Patient will demonstrate increased awareness and strategies to attend to right side to hold items without dropping items.    Baseline  drops items frequently, 30 visit-patient dropping objects less frequently and is more aware when holding objects in hand    Time  12    Period  Weeks    Status  On-going      OT LONG TERM GOAL #4   Title  Patient will demonstrate independence in home exercise program for ROM, strength and coordination of right hand.    Baseline  no current program, modified independent with current program at 30 visit but continuing to add exercises.    Time  6    Period  Weeks    Status  On-going      OT LONG TERM GOAL #5   Title  Patient will demonstrate the ability to obtain items from right pocket 90% of the time.    Baseline  difficulty getting keys or coins out of right pocket, 30 visit update-able to demonstrate 75% of the time    Time  12    Period  Weeks    Status  On-going      OT LONG TERM GOAL #6   Title  Patient will demonstrate pouring a drink with the right hand with attention to right side and not knocking the cup over and spilling.      Baseline  patient often does not realize the cup is on his right side and knocks it over when attempting to get a drink    Time  12    Period  Weeks    Status  New  Plan - 01/24/18 1055    Clinical Impression Statement  Patient demonstrated  decreased legibility today which could be a result of using a different pen or from hand fatigue this date.  He has consistently shown improvements in legibility of first name over time.  He requires occasionally cues for tripod grasp on pen and tends to hold pen too far up the shaft which decreases his control.  He did really well with PVC task with design copy, may need to explore this concept more as a means of communication at home when requesting him to perform tasks. Moderate difficulty with use of tool work bench which requires more proprioception and hand function to complete.     Occupational Profile and client history currently impacting functional performance  repeated falls, TBI, expressive aphasia, limited motion in right hand, lack of coordination from previous CVA    Occupational performance deficits (Please refer to evaluation for details):  ADL's;IADL's;Leisure    Rehab Potential  Good    Current Impairments/barriers affecting progress:  expressive aphasia, falls, neglect    OT Frequency  2x / week    OT Duration  12 weeks    OT Treatment/Interventions  Self-care/ADL training;Visual/perceptual remediation/compensation;DME and/or AE instruction;Patient/family education;Moist Heat;Therapeutic exercise;Manual Therapy;Therapeutic activities;Neuromuscular education    Consulted and Agree with Plan of Care  Patient       Patient will benefit from skilled therapeutic intervention in order to improve the following deficits and impairments:  Impaired vision/preception, Decreased coordination, Decreased safety awareness, Impaired UE functional use, Decreased strength  Visit Diagnosis: Muscle weakness (generalized)  Other lack of coordination  Neurologic neglect syndrome    Problem List Patient Active Problem List   Diagnosis Date Noted  . Sleep disturbance   . PAF (paroxysmal atrial fibrillation) ([place])   . Recurrent UTI   . Hypokalemia   . Hyponatremia   . Abnormal urine   .  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 Benign essential HTN   . Urinary retention   . History of gout   . Global aphasia   . SAH (subarachnoid hemorrhage) ([place])   . SDH (subdural hematoma) ([place])   . Lymphocytosis   . Subdural hemorrhage following injury ([place]) 05/12/2016   James [name] T James [name], OTR/L, CLT  James [name] 01/24/2018, 11:30 AM  [place] MAIN [facility] SERVICES [place], [place], [address] Phone: [phone]   Fax:  [phone]  Name: James [name] MRN: [id number] Date of Birth: Jul 05, 1941

## 2018-01-31 ENCOUNTER — Ambulatory Visit: Payer: PPO | Admitting: Occupational Therapy

## 2018-01-31 DIAGNOSIS — M6281 Muscle weakness (generalized): Secondary | ICD-10-CM

## 2018-01-31 DIAGNOSIS — R414 Neurologic neglect syndrome: Secondary | ICD-10-CM

## 2018-01-31 DIAGNOSIS — R278 Other lack of coordination: Secondary | ICD-10-CM

## 2018-02-05 ENCOUNTER — Encounter: Payer: Self-pay | Admitting: Occupational Therapy

## 2018-02-07 NOTE — Therapy (Signed)
[place] MAIN [facility] SERVICES [address] [place], [place], [address] Phone: [phone]   Fax:  [phone]  Occupational Therapy Treatment  Patient Details  Name: James [name] MRN: [id number] Date of Birth: [birth year] Referring Provider: Manuella Ghazi   Encounter Date: 01/31/2018  OT End of Session - 02/07/18 0936    Visit Number  42    Number of Visits  60    Date for OT Re-Evaluation  03/15/18    Authorization Type  Medicare    OT Start Time  1300    OT Stop Time  1350    OT Time Calculation (min)  50 min    Activity Tolerance  Patient tolerated treatment well    Behavior During Therapy  [facility] for tasks assessed/performed       Past Medical History:  Diagnosis Date  . Alcohol abuse, in remission   . Atrial fibrillation ([place])   . Bilateral renal masses   . Closed right ankle fracture   . 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 Gait disorder   . Gout   . Hypertension   . Seizure disorder ([place])   . 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     Past Surgical History:  Procedure Laterality Date  . CRANIECTOMY FOR DEPRESSED SKULL FRACTURE  2009   with MRSA infection  . CRANIOTOMY     X 2  . HERNIA REPAIR     during childhood  . ORIF ANKLE FRACTURE Left 2012    There were no vitals filed for this visit.  Subjective Assessment - 02/07/18 0935    Subjective   Patient brought in 2 coin purses his wife ordered on [facility] based on therapist recommendations.  Patient also brought in some quarters with a vertical holder and showing therapist what exercise he was trying at home to simulate one we did last session.     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  He suffered another fall in 2017.  Since 2005 he  has suffered from expressive aphasia.     Patient Stated Goals  to be able to use the right hand functionally    Currently in Pain?  No/denies    Multiple Pain Sites  No                           OT Education - 02/07/18 0935    Education provided  Yes    Education Details  use of coin holder    Person(s) Educated  Patient;Spouse    Methods  Explanation;Demonstration;Verbal cues    Comprehension  Verbal cues required;Returned demonstration;Verbalized understanding          OT Long Term Goals - 01/17/18 0909      OT LONG TERM GOAL #1   Title  Patient will improve right hand function to hold utensil in right hand for self feeding with modified independence.     Baseline  unable to hold utensil at eval, now able to hold utensils and self feed with minimal spillage.    Time  12    Period  Weeks    Status  Achieved      OT LONG TERM GOAL #  2   Title  Pt will increase hand strength in right by 5# to be able to cut food with modified independence.    Baseline  unable at eval, able to cut meat with occasional assist at 30 visit.    Time  12    Period  Weeks    Status  Achieved      OT LONG TERM GOAL #3   Title  Patient will demonstrate increased awareness and strategies to attend to right side to hold items without dropping items.    Baseline  drops items frequently, 30 visit-patient dropping objects less frequently and is more aware when holding objects in hand    Time  12    Period  Weeks    Status  On-going      OT LONG TERM GOAL #4   Title  Patient will demonstrate independence in home exercise program for ROM, strength and coordination of right hand.    Baseline  no current program, modified independent with current program at 30 visit but continuing to add exercises.    Time  6    Period  Weeks    Status  On-going      OT LONG TERM GOAL #5   Title  Patient will demonstrate the ability to obtain items from right pocket 90% of the time.    Baseline   difficulty getting keys or coins out of right pocket, 30 visit update-able to demonstrate 75% of the time    Time  12    Period  Weeks    Status  On-going      OT LONG TERM GOAL #6   Title  Patient will demonstrate pouring a drink with the right hand with attention to right side and not knocking the cup over and spilling.      Baseline  patient often does not realize the cup is on his right side and knocks it over when attempting to get a drink    Time  12    Period  Weeks    Status  New            Plan - 02/07/18 0936    Clinical Impression Statement  Patient continues to demonstrate progress with right hand functional use, focused this date on use of coin purs, 2 types to place into pocket to manage coins.  Without a coin holder, he can not reach into pocket and obain coins without dropping.  Patient required cues for positioning of coin holder and use of both sides of the hand to manipulate holder open to place and remove coins.  Patient to continue to work on picking up coins, placing in and out of the holder.  Continue to work towards goals in plan of care to improve independence in daily tasks.     Occupational Profile and client history currently impacting functional performance  repeated falls, TBI, expressive aphasia, limited motion in right hand, lack of coordination from previous CVA    Occupational performance deficits (Please refer to evaluation for details):  ADL's;IADL's;Leisure    Rehab Potential  Good    Current Impairments/barriers affecting progress:  expressive aphasia, falls, neglect    OT Frequency  2x / week    OT Duration  12 weeks    OT Treatment/Interventions  Self-care/ADL training;Visual/perceptual remediation/compensation;DME and/or AE instruction;Patient/family education;Moist Heat;Therapeutic exercise;Manual Therapy;Therapeutic activities;Neuromuscular education    Consulted and Agree with Plan of Care  Patient       Patient will benefit from  skilled  therapeutic intervention in order to improve the following deficits and impairments:  Impaired vision/preception, Decreased coordination, Decreased safety awareness, Impaired UE functional use, Decreased strength  Visit Diagnosis: Muscle weakness (generalized)  Other lack of coordination  Neurologic neglect syndrome    Problem List Patient Active Problem List   Diagnosis Date Noted  . Sleep disturbance   . PAF (paroxysmal atrial fibrillation) ([place])   . Recurrent UTI   . Hypokalemia   . Hyponatremia   . Abnormal urine   . 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 Benign essential HTN   . Urinary retention   . History of gout   . Global aphasia   . SAH (subarachnoid hemorrhage) ([place])   . SDH (subdural hematoma) ([place])   . Lymphocytosis   . Subdural hemorrhage following injury ([place]) 05/12/2016   Amy T Lovett, OTR/L, CLT  Lovett,Amy 02/07/2018, 9:41 AM  [place] MAIN [facility] SERVICES [address] [place], [place], [address] Phone: [phone]   Fax:  [phone]  Name: James [name] MRN: [id number] Date of Birth: 10-11-1940

## 2018-02-08 ENCOUNTER — Ambulatory Visit: Payer: PPO | Admitting: Occupational Therapy

## 2018-02-08 DIAGNOSIS — R278 Other lack of coordination: Secondary | ICD-10-CM

## 2018-02-08 DIAGNOSIS — M6281 Muscle weakness (generalized): Secondary | ICD-10-CM

## 2018-02-08 DIAGNOSIS — R414 Neurologic neglect syndrome: Secondary | ICD-10-CM

## 2018-02-10 ENCOUNTER — Encounter: Payer: Self-pay | Admitting: Occupational Therapy

## 2018-02-10 NOTE — Therapy (Signed)
[place] MAIN [facility] SERVICES [address] [place], [place], [address] Phone: [phone]   Fax:  [phone]  Occupational Therapy Treatment  Patient Details  Name: James [name] MRN: [id number] Date of Birth: [birth year] Referring Provider: Manuella Ghazi   Encounter Date: 02/08/2018  OT End of Session - 02/10/18 1145    Visit Number  43    Number of Visits  60    Date for OT Re-Evaluation  03/15/18    Authorization Type  Medicare    OT Start Time  1101    OT Stop Time  1145    OT Time Calculation (min)  44 min    Activity Tolerance  Patient tolerated treatment well    Behavior During Therapy  [facility] for tasks assessed/performed       Past Medical History:  Diagnosis Date  . Alcohol abuse, in remission   . Atrial fibrillation ([place])   . Bilateral renal masses   . Closed right ankle fracture   . 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 Gait disorder   . Gout   . Hypertension   . Seizure disorder ([place])   . 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     Past Surgical History:  Procedure Laterality Date  . CRANIECTOMY FOR DEPRESSED SKULL FRACTURE  2009   with MRSA infection  . CRANIOTOMY     X 2  . HERNIA REPAIR     during childhood  . ORIF ANKLE FRACTURE Left 2012    There were no vitals filed for this visit.  Subjective Assessment - 02/10/18 1145    Subjective   Patient indicating he would like to work on use of tools today.    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  He suffered another fall in 2017.  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     Patient Stated Goals  to be able to use the right hand functionally    Currently in Pain?  No/denies    Multiple Pain Sites  No          Patient seen this date for focus on tool use,  manipulation of small pieces, coordination of tasks involving  Bilateral hand use.  Verbal cues at times for attending to tasks on the right, hand over hand tactile cues on Tools to assist with stabilizing and coordination of turning and use. Used hex tool and large flathead screwdriver  This date.  Difficulty at times with using work bench since bottom of bench requires vision occluded.                 OT Education - 02/10/18 1145    Education provided  Yes    Education Details  tool use    Person(s) Educated  Patient;Spouse    Methods  Explanation;Demonstration;Verbal cues    Comprehension  Verbal cues required;Returned demonstration;Verbalized understanding          OT Long Term Goals - 01/17/18 0909      OT LONG TERM GOAL #1   Title  Patient will improve right hand function to hold utensil in right hand for self feeding with modified independence.     Baseline  unable to hold utensil at eval, now able to hold utensils and self feed  with minimal spillage.    Time  12    Period  Weeks    Status  Achieved      OT LONG TERM GOAL #2   Title  Pt will increase hand strength in right by 5# to be able to cut food with modified independence.    Baseline  unable at eval, able to cut meat with occasional assist at 30 visit.    Time  12    Period  Weeks    Status  Achieved      OT LONG TERM GOAL #3   Title  Patient will demonstrate increased awareness and strategies to attend to right side to hold items without dropping items.    Baseline  drops items frequently, 30 visit-patient dropping objects less frequently and is more aware when holding objects in hand    Time  12    Period  Weeks    Status  On-going      OT LONG TERM GOAL #4   Title  Patient will demonstrate independence in home exercise program for ROM, strength and coordination of right hand.    Baseline  no current program, modified independent with current program at 30 visit but continuing to add exercises.     Time  6    Period  Weeks    Status  On-going      OT LONG TERM GOAL #5   Title  Patient will demonstrate the ability to obtain items from right pocket 90% of the time.    Baseline  difficulty getting keys or coins out of right pocket, 30 visit update-able to demonstrate 75% of the time    Time  12    Period  Weeks    Status  On-going      OT LONG TERM GOAL #6   Title  Patient will demonstrate pouring a drink with the right hand with attention to right side and not knocking the cup over and spilling.      Baseline  patient often does not realize the cup is on his right side and knocks it over when attempting to get a drink    Time  12    Period  Weeks    Status  New            Plan - 02/10/18 1146    Clinical Impression Statement  Patient working towards improving right hand use for functional tasks and with the use of tools to complete home maintenance tasks.  He worked this date on attempts to hold and use tool in right hand, has some difficulty with stabilizing tool and turning tool at times.  He tends to alternate between using his right hand to hold the tool and the left when he is trying to achieve precision.  Continue to work towards improving hand function for daily activities.    Occupational Profile and client history currently impacting functional performance  repeated falls, TBI, expressive aphasia, limited motion in right hand, lack of coordination from previous CVA    Occupational performance deficits (Please refer to evaluation for details):  ADL's;IADL's;Leisure    Rehab Potential  Good    Current Impairments/barriers affecting progress:  expressive aphasia, falls, neglect    OT Frequency  2x / week    OT Duration  12 weeks    OT Treatment/Interventions  Self-care/ADL training;Visual/perceptual remediation/compensation;DME and/or AE instruction;Patient/family education;Moist Heat;Therapeutic exercise;Manual Therapy;Therapeutic activities;Neuromuscular education     Consulted and Agree with Plan of Care  Patient  Patient will benefit from skilled therapeutic intervention in order to improve the following deficits and impairments:  Impaired vision/preception, Decreased coordination, Decreased safety awareness, Impaired UE functional use, Decreased strength  Visit Diagnosis: Muscle weakness (generalized)  Other lack of coordination  Neurologic neglect syndrome    Problem List Patient Active Problem List   Diagnosis Date Noted  . Sleep disturbance   . PAF (paroxysmal atrial fibrillation) ([place])   . Recurrent UTI   . Hypokalemia   . Hyponatremia   . Abnormal urine   . 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 Benign essential HTN   . Urinary retention   . History of gout   . Global aphasia   . SAH (subarachnoid hemorrhage) ([place])   . SDH (subdural hematoma) ([place])   . Lymphocytosis   . Subdural hemorrhage following injury ([place]) 05/12/2016   Keniya Schlotterbeck T Deavion Dobbs, OTR/L, CLT  Debrina Kizer 02/10/2018, 11:49 AM  [place], [place], [address] Phone: [phone]   Fax:  [phone]  Name: James [name] MRN: [id number] Date of Birth: Jul 03, 1941

## 2018-02-13 ENCOUNTER — Ambulatory Visit: Payer: PPO | Admitting: Occupational Therapy

## 2018-02-13 DIAGNOSIS — R278 Other lack of coordination: Secondary | ICD-10-CM

## 2018-02-13 DIAGNOSIS — R414 Neurologic neglect syndrome: Secondary | ICD-10-CM

## 2018-02-13 DIAGNOSIS — M6281 Muscle weakness (generalized): Secondary | ICD-10-CM | POA: Diagnosis not present

## 2018-02-15 ENCOUNTER — Encounter: Payer: Self-pay | Admitting: Occupational Therapy

## 2018-02-15 NOTE — Therapy (Signed)
[place] MAIN [facility] SERVICES [address] [place], [place], [address] Phone: [phone]   Fax:  [phone]  Occupational Therapy Treatment  Patient Details  Name: James [name] MRN: [id number] Date of Birth: [birth year] Referring Provider: Manuella Ghazi   Encounter Date: 02/13/2018  OT End of Session - 02/15/18 0846    Visit Number  44    Number of Visits  60    Date for OT Re-Evaluation  03/15/18    Authorization Type  Medicare    OT Start Time  1100    OT Stop Time  1140    OT Time Calculation (min)  40 min    Activity Tolerance  Patient tolerated treatment well    Behavior During Therapy  [facility] for tasks assessed/performed       Past Medical History:  Diagnosis Date  . Alcohol abuse, in remission   . Atrial fibrillation ([place])   . Bilateral renal masses   . Closed right ankle fracture   . 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 Gait disorder   . Gout   . Hypertension   . Seizure disorder ([place])   . 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     Past Surgical History:  Procedure Laterality Date  . CRANIECTOMY FOR DEPRESSED SKULL FRACTURE  2009   with MRSA infection  . CRANIOTOMY     X 2  . HERNIA REPAIR     during childhood  . ORIF ANKLE FRACTURE Left 2012    There were no vitals filed for this visit.  Subjective Assessment - 02/15/18 0845    Subjective   Patient denies any pain.  Wife reports patient is doing well at home, working on exercises, occasionally gets frustrated. Advised wife to encourage patient to take a break when he appears frustrated with task.     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  He suffered another fall in 2017.  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     Patient Stated Goals  to be able to use the  right hand functionally    Currently in Pain?  No/denies    Multiple Pain Sites  No         Patient seen for manipulation of 100 pegboard pieces to pick up and flip/turn to opposite end to place back into grid.   Cues to use middle finger and thumb with index finger to attempt turning and flipping object, he tends to pick up with  thumb and index and cannot perform any movements for manipulation. Instructed on Connect 4 to promote right handed use to pick up and manipulate checkers, encourage reach to place into  grid and to promote right sided awareness when viewing the board and trying to place items strategically.  Cues for  detecting diagonal patterns.  Multiple trials performed and patient demonstrated increased awareness after performing  several times. When items were missed, it was usually on the right side and he required cues to detect.  Handwriting for name on smaller lines today with use of medium built up handle.   Legibility varies at times but is overall improving over time.  Legibility is also dependent upon pen grip pattern.  OT Education - 02/15/18 [phone]    Education provided  Yes    Education Details  attention to right side, scanning, [place]    Person(s) Educated  Patient;Spouse    Methods  Explanation;Demonstration;Verbal cues    Comprehension  Verbal cues required;Returned demonstration;Verbalized understanding          OT Long Term Goals - 01/17/18 0909      OT LONG TERM GOAL #1   Title  Patient will improve right hand function to hold utensil in right hand for self feeding with modified independence.     Baseline  unable to hold utensil at eval, now able to hold utensils and self feed with minimal spillage.    Time  12    Period  Weeks    Status  Achieved      OT LONG TERM GOAL #2   Title  Pt will increase hand strength in right by 5# to be able to cut food with modified independence.    Baseline  unable at eval, able to  cut meat with occasional assist at 30 visit.    Time  12    Period  Weeks    Status  Achieved      OT LONG TERM GOAL #3   Title  Patient will demonstrate increased awareness and strategies to attend to right side to hold items without dropping items.    Baseline  drops items frequently, 30 visit-patient dropping objects less frequently and is more aware when holding objects in hand    Time  12    Period  Weeks    Status  On-going      OT LONG TERM GOAL #4   Title  Patient will demonstrate independence in home exercise program for ROM, strength and coordination of right hand.    Baseline  no current program, modified independent with current program at 30 visit but continuing to add exercises.    Time  6    Period  Weeks    Status  On-going      OT LONG TERM GOAL #5   Title  Patient will demonstrate the ability to obtain items from right pocket 90% of the time.    Baseline  difficulty getting keys or coins out of right pocket, 30 visit update-able to demonstrate 75% of the time    Time  12    Period  Weeks    Status  On-going      OT LONG TERM GOAL #6   Title  Patient will demonstrate pouring a drink with the right hand with attention to right side and not knocking the cup over and spilling.      Baseline  patient often does not realize the cup is on his right side and knocks it over when attempting to get a drink    Time  12    Period  Weeks    Status  New            Plan - 02/15/18 0847    Clinical Impression Statement  Patient has continued to make good progress with right UE hand function and increased use into daily tasks.  He demonstrates decreased awareness and attention to the right side at times but responds well to cues.  Patient continues to work on home exercises with wife's supervision and support and will continue to offer additional ideas and exercises each week to add to program.  He is demonstrating increased use with right hand at home and  in the clinic with  self feeding, handwriting and spontaneous use of hand during tasks.  When meeting people for the first time, he is extending his right hand to shake hands to say hello.  Today while working, he dropped an item on the floor and impulsively jumped up to get it, was off balance but reached for the chair and was able to steady self.  Patient has been observed on multiple occasions this month in therapy with episodes of decreased balance but no falls in the clinic.  Continue to work towards goals.     Occupational Profile and client history currently impacting functional performance  repeated falls, TBI, expressive aphasia, limited motion in right hand, lack of coordination from previous CVA    Occupational performance deficits (Please refer to evaluation for details):  ADL's;IADL's;Leisure    Rehab Potential  Good    Current Impairments/barriers affecting progress:  expressive aphasia, falls, neglect    OT Frequency  2x / week    OT Duration  12 weeks    OT Treatment/Interventions  Self-care/ADL training;Visual/perceptual remediation/compensation;DME and/or AE instruction;Patient/family education;Moist Heat;Therapeutic exercise;Manual Therapy;Therapeutic activities;Neuromuscular education    Consulted and Agree with Plan of Care  Patient       Patient will benefit from skilled therapeutic intervention in order to improve the following deficits and impairments:  Impaired vision/preception, Decreased coordination, Decreased safety awareness, Impaired UE functional use, Decreased strength  Visit Diagnosis: Muscle weakness (generalized)  Other lack of coordination  Neurologic neglect syndrome    Problem List Patient Active Problem List   Diagnosis Date Noted  . Sleep disturbance   . PAF (paroxysmal atrial fibrillation) ([place])   . Recurrent UTI   . Hypokalemia   . Hyponatremia   . Abnormal urine   . 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 Benign essential HTN   . Urinary  retention   . History of gout   . Global aphasia   . SAH (subarachnoid hemorrhage) ([place])   . SDH (subdural hematoma) ([place])   . Lymphocytosis   . Subdural hemorrhage following injury ([place]) 05/12/2016    Alyvia Derk 02/15/2018, 8:50 AM  [place] MAIN [facility] SERVICES [address] [place], [place], [address] Phone: [phone]   Fax:  [phone]  Name: James [name] MRN: [id number] Date of Birth: [birth year]

## 2018-02-19 ENCOUNTER — Ambulatory Visit: Payer: PPO | Admitting: Occupational Therapy

## 2018-02-19 DIAGNOSIS — M6281 Muscle weakness (generalized): Secondary | ICD-10-CM | POA: Diagnosis not present

## 2018-02-19 DIAGNOSIS — R414 Neurologic neglect syndrome: Secondary | ICD-10-CM

## 2018-02-19 DIAGNOSIS — R278 Other lack of coordination: Secondary | ICD-10-CM

## 2018-02-21 ENCOUNTER — Encounter: Payer: Self-pay | Admitting: Occupational Therapy

## 2018-02-21 DIAGNOSIS — R29898 Other symptoms and signs involving the musculoskeletal system: Secondary | ICD-10-CM | POA: Diagnosis not present

## 2018-02-21 DIAGNOSIS — R2689 Other abnormalities of gait and mobility: Secondary | ICD-10-CM | POA: Diagnosis not present

## 2018-02-21 DIAGNOSIS — G40209 Localization-related (focal) (partial) symptomatic epilepsy and epileptic syndromes with complex partial seizures, not intractable, without status epilepticus: Secondary | ICD-10-CM | POA: Diagnosis not present

## 2018-02-21 DIAGNOSIS — I69320 Aphasia following cerebral infarction: Secondary | ICD-10-CM | POA: Diagnosis not present

## 2018-02-21 NOTE — Therapy (Signed)
[place] MAIN [facility] SERVICES [address] [place], [place], [address] Phone: [phone]   Fax:  [phone]  Occupational Therapy Treatment  Patient Details  Name: James [name] MRN: [id number] Date of Birth: 09-09-1940 Referring Provider: Manuella Ghazi   Encounter Date: 02/19/2018  OT End of Session - 02/21/18 2000    Visit Number  45    Number of Visits  60    Date for OT Re-Evaluation  03/15/18    Authorization Type  Medicare    OT Start Time  1101    OT Stop Time  1147    OT Time Calculation (min)  46 min    Activity Tolerance  Patient tolerated treatment well    Behavior During Therapy  [facility] for tasks assessed/performed       Past Medical History:  Diagnosis Date  . Alcohol abuse, in remission   . Atrial fibrillation ([place])   . Bilateral renal masses   . Closed right ankle fracture   . 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 Gait disorder   . Gout   . Hypertension   . Seizure disorder ([place])   . 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     Past Surgical History:  Procedure Laterality Date  . CRANIECTOMY FOR DEPRESSED SKULL FRACTURE  2009   with MRSA infection  . CRANIOTOMY     X 2  . HERNIA REPAIR     during childhood  . ORIF ANKLE FRACTURE Left 2012    There were no vitals filed for this visit.  Subjective Assessment - 02/21/18 1959    Subjective   Patient admits to multiple falls over the last few weeks but denies any injuries.     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  He suffered another fall in 2017.  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     Patient Stated Goals  to be able to use the right hand functionally    Currently in Pain?  No/denies    Multiple Pain Sites   No                           OT Education - 02/21/18 2000    Education provided  Yes    Education Details  fall reduction, potential PT referral, safety    Person(s) Educated  Patient;Spouse    Methods  Explanation;Demonstration;Verbal cues    Comprehension  Verbal cues required;Returned demonstration;Verbalized understanding          OT Long Term Goals - 01/17/18 0909      OT LONG TERM GOAL #1   Title  Patient will improve right hand function to hold utensil in right hand for self feeding with modified independence.     Baseline  unable to hold utensil at eval, now able to hold utensils and self feed with minimal spillage.    Time  12    Period  Weeks    Status  Achieved      OT LONG TERM GOAL #2   Title  Pt will increase hand strength in right by 5# to be able to cut food with modified independence.    Baseline  unable at eval, able to cut meat with occasional assist at 30 visit.    Time  12    Period  Weeks    Status  Achieved      OT LONG TERM GOAL #3   Title  Patient will demonstrate increased awareness and strategies to attend to right side to hold items without dropping items.    Baseline  drops items frequently, 30 visit-patient dropping objects less frequently and is more aware when holding objects in hand    Time  12    Period  Weeks    Status  On-going      OT LONG TERM GOAL #4   Title  Patient will demonstrate independence in home exercise program for ROM, strength and coordination of right hand.    Baseline  no current program, modified independent with current program at 30 visit but continuing to add exercises.    Time  6    Period  Weeks    Status  On-going      OT LONG TERM GOAL #5   Title  Patient will demonstrate the ability to obtain items from right pocket 90% of the time.    Baseline  difficulty getting keys or coins out of right pocket, 30 visit update-able to demonstrate 75% of the time    Time  12    Period  Weeks     Status  On-going      OT LONG TERM GOAL #6   Title  Patient will demonstrate pouring a drink with the right hand with attention to right side and not knocking the cup over and spilling.      Baseline  patient often does not realize the cup is on his right side and knocks it over when attempting to get a drink    Time  12    Period  Weeks    Status  New            Plan - 02/21/18 2001    Clinical Impression Statement  Patient with decreased balance in the last few weeks and has had multiple falls without injury.  Had PT perform short free screen this date and patient would likely benefit from PT referral to further address balance and gait.      Occupational Profile and client history currently impacting functional performance  repeated falls, TBI, expressive aphasia, limited motion in right hand, lack of coordination from previous CVA    Occupational performance deficits (Please refer to evaluation for details):  ADL's;IADL's;Leisure    Rehab Potential  Good    Current Impairments/barriers affecting progress:  expressive aphasia, falls, neglect    OT Frequency  2x / week    OT Duration  12 weeks    OT Treatment/Interventions  Self-care/ADL training;Visual/perceptual remediation/compensation;DME and/or AE instruction;Patient/family education;Moist Heat;Therapeutic exercise;Manual Therapy;Therapeutic activities;Neuromuscular education    Consulted and Agree with Plan of Care  Patient    Family Member Consulted  wife, [name]       Patient will benefit from skilled therapeutic intervention in order to improve the following deficits and impairments:  Impaired vision/preception, Decreased coordination, Decreased safety awareness, Impaired UE functional use, Decreased strength  Visit Diagnosis: Muscle weakness (generalized)  Other lack of coordination  Neurologic neglect syndrome    Problem List Patient Active Problem List   Diagnosis Date Noted  . Sleep disturbance   . PAF  (paroxysmal atrial fibrillation) ([place])   . Recurrent UTI   . Hypokalemia   . Hyponatremia   .  Abnormal urine   . 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 Benign essential HTN   . Urinary retention   . History of gout   . Global aphasia   . SAH (subarachnoid hemorrhage) ([place])   . SDH (subdural hematoma) ([place])   . Lymphocytosis   . Subdural hemorrhage following injury ([place]) 05/12/2016   Ellyson Rarick T Era Parr, OTR/L, CLT  Toriano Aikey 02/21/2018, 8:04 PM  [place] MAIN [facility] SERVICES [address] [place], [place], [address] Phone: [phone]   Fax:  [phone]  Name: Shelden Raborn MRN: [id number] Date of Birth: [birth year]

## 2018-02-26 ENCOUNTER — Ambulatory Visit: Payer: PPO | Attending: Neurology | Admitting: Physical Therapy

## 2018-02-26 ENCOUNTER — Other Ambulatory Visit: Payer: Self-pay

## 2018-02-26 ENCOUNTER — Encounter: Payer: Self-pay | Admitting: Occupational Therapy

## 2018-02-26 ENCOUNTER — Encounter: Payer: Self-pay | Admitting: Physical Therapy

## 2018-02-26 ENCOUNTER — Ambulatory Visit: Payer: PPO | Admitting: Occupational Therapy

## 2018-02-26 DIAGNOSIS — R2681 Unsteadiness on feet: Secondary | ICD-10-CM

## 2018-02-26 DIAGNOSIS — M6281 Muscle weakness (generalized): Secondary | ICD-10-CM

## 2018-02-26 DIAGNOSIS — R414 Neurologic neglect syndrome: Secondary | ICD-10-CM

## 2018-02-26 DIAGNOSIS — R278 Other lack of coordination: Secondary | ICD-10-CM

## 2018-02-26 DIAGNOSIS — R262 Difficulty in walking, not elsewhere classified: Secondary | ICD-10-CM | POA: Insufficient documentation

## 2018-02-26 DIAGNOSIS — I639 Cerebral infarction, unspecified: Secondary | ICD-10-CM | POA: Insufficient documentation

## 2018-02-26 NOTE — Addendum Note (Signed)
Addended by: Alanson Puls on: 02/26/2018 12:02 PM   Modules accepted: Orders

## 2018-02-26 NOTE — Therapy (Signed)
[place] MAIN [facility] SERVICES [address] [place], [place], [address] Phone: [phone]   Fax:  [phone]  Physical Therapy Evaluation  Patient Details  Name: James [name] MRN: [id number] Date of Birth: 1941-06-19 Referring Provider: shah   Encounter Date: 02/26/2018  PT End of Session - 02/26/18 1124    Visit Number  1    Number of Visits  25    Date for PT Re-Evaluation  05/21/18    Authorization Type  1/10 starting on 02/26/18    PT Start Time  1100    PT Stop Time  1145    PT Time Calculation (min)  45 min    Equipment Utilized During Treatment  Gait belt    Activity Tolerance  Patient tolerated treatment well       Past Medical History:  Diagnosis Date  . Alcohol abuse, in remission   . Atrial fibrillation ([place])   . Bilateral renal masses   . Closed right ankle fracture   . 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 Gait disorder   . Gout   . Hypertension   . Seizure disorder ([place])   . 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     Past Surgical History:  Procedure Laterality Date  . CRANIECTOMY FOR DEPRESSED SKULL FRACTURE  2009   with MRSA infection  . CRANIOTOMY     X 2  . HERNIA REPAIR     during childhood  . ORIF ANKLE FRACTURE Left 2012    There were no vitals filed for this visit.   Subjective Assessment - 02/26/18 1106    Subjective  Patient is having more staggering in his walking and his gait is unsteady.     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  He suffered another fall in 2017.  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    Patient Stated Goals  Patient wants to have a safer gait and not stagger or feel off balance.     Currently in Pain?  No/denies         [facility] PT Assessment - 02/26/18 1102       Assessment   Medical Diagnosis  Balance/Gait    Referring Provider  shah    Onset Date/Surgical Date  02/21/18    Prior Therapy  OT,       Precautions   Precautions  Fall      Restrictions   Weight Bearing Restrictions  No      Balance Screen   Has the patient fallen in the past 6 months  No    Has the patient had a decrease in activity level because of a fear of falling?   Yes    Is the patient reluctant to leave their home because of a fear of falling?   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   Overall Cognitive Status  History of cognitive impairments - at baseline    Attention  Focused    Focused Attention  Appears intact    Memory  Impaired  Memory Impairment  Decreased recall of new information    Awareness  Appears intact    Behaviors  -- impulsive         POSTURE: WNL   PROM/AROM: WFL BUE and BLE  STRENGTH:  Graded on a 0-5 scale Muscle Group Left Right                          Hip Flex 4/5 -4/5  Hip Abd 3/5 -3/5  Hip Add 3/5 -3/5  Hip Ext 3/5 -3/5  Hip IR/ER    Knee Flex 5/5 5/5  Knee Ext 5/5 5/5  Ankle DF 4/5 4/5  Ankle PF 4/5 4/5   SENSATION: WNL BUE and BLE   Coordination :  Poor finger to nose test with decreased speed and decreased accuracy Decreased ankle to knee test with decreased speed and accuracy   FUNCTIONAL MOBILITY:Poor safety with sit to stand transfers with uncontrolled decent and staggering sit to stand with multiple attempts to stand   BALANCE: Standing Dynamic Balance  Normal Stand independently unsupported, able to weight shift and cross midline maximally   Good Stand independently unsupported, able to weight shift and cross midline moderately x  Good-/Fair+ Stand independently unsupported, able to weight shift across midline minimally   Fair Stand independently  unsupported, weight shift, and reach ipsilaterally, loss of balance when crossing midline   Poor+ Able to stand with Min A and reach ipsilaterally, unable to weight shift   Poor Able to stand with Mod A and minimally reach ipsilaterally, unable to cross midline.    Static Standing Balance  Normal Able to maintain standing balance against maximal resistance   Good Able to maintain standing balance against moderate resistance   Good-/Fair+ Able to maintain standing balance against minimal resistance x  Fair Able to stand unsupported without UE support and without LOB for 1-2 min   Fair- Requires Min A and UE support to maintain standing without loss of balance   Poor+ Requires mod A and UE support to maintain standing without loss of balance   Poor Requires max A and UE support to maintain standing balance without loss       GAIT: Patient ambulates with spc with outdoor ambulation and has path deviation and difficulty with turns with staggering , unsteady gait   OUTCOME MEASURES: TEST Outcome Interpretation  5 times sit<>stand 19.15sec >60 yo, >15 sec indicates increased risk for falls  10 meter walk test    1.0             m/s <1.0 m/s indicates increased risk for falls; limited community ambulator  Timed up and Go  15.18               sec <14 sec indicates increased risk for falls  DGI  16/24 24/24 is no falls risk  Berg Balance Assessment50/56 36/56 <36/56 (100% risk for falls), 37-45 (80% risk for falls); 46-51 (>50% risk for falls); 52-55 (lower risk <25% of falls)            Objective measurements completed on examination: See above findings.   Treatment: Bridges x 15 x 2 SAQ with 4 lbs x 15 x 2 BLE Heel raises x 20            PT Education - 02/26/18 1120    Education Details  Plan of care    Person(s) Educated  Patient;Spouse    Methods  Explanation    Comprehension  Verbalized understanding       PT Short Term Goals - 02/26/18 1128      PT SHORT TERM  GOAL #1   Title  Patient will be independent in home exercise program to improve strength/mobility for better functional independence with ADLs.    Time  6    Period  Weeks    Status  New    Target Date  04/09/18      PT SHORT TERM GOAL #2   Title  Patient (> 29 years old) will complete five times sit to stand test in < 15 seconds indicating an increased LE strength and improved balance.    Baseline  19.15 sec and needs CGA to min assist to keep from falling    Time  6    Period  Weeks    Status  New    Target Date  04/09/18        PT Long Term Goals - 02/26/18 1129      PT LONG TERM GOAL #1   Title  Patient will increase Berg Balance score by > 6 points to demonstrate decreased fall risk during functional activities.    Baseline  36/56 Berg    Time  12    Period  Weeks    Status  New    Target Date  05/21/18      PT LONG TERM GOAL #2   Title  Patient will reduce timed up and go to <11 seconds to reduce fall risk and demonstrate improved transfer/gait ability.    Baseline  15.18 sec with poor safety judgement and uncontrolled sit    Time  12    Period  Weeks    Status  New    Target Date  05/21/18      PT LONG TERM GOAL #3   Title  Patient will increase dynamic gait index score to >19/24 as to demonstrate reduced fall risk and improved dynamic gait balance for better safety with community/home ambulation.     Baseline  16/24    Time  12    Period  Weeks    Status  New    Target Date  05/21/18             Plan - 02/26/18 1125    Clinical Impression Statement  Patient presents with decreased BLE strength, decreased gait speed, decreased standing balance. Patient has decreased Berg balance test and decreased outcome measures.  Patient's main complaint is unsteady gait  and inability to participate in desired activities. Patient will benefit from skilled therapy in order to increase LE strength and standing balance and increase gait speed and improve ability to  participate in desired activities, and decrease  falls risk.     Clinical Decision Making  Moderate    Rehab Potential  Good    PT Frequency  2x / week    PT Duration  12 weeks    PT Treatment/Interventions  Therapeutic activities;Therapeutic exercise;Balance training;Neuromuscular re-education;Functional mobility training;Gait training;Manual techniques    PT Next Visit Plan  balance and HEP    PT Home Exercise Plan  heel raises, bridges    Consulted and Agree with Plan of Care  Patient;Family member/caregiver       Patient will benefit from skilled therapeutic intervention in order to improve the following deficits and impairments:  Abnormal gait, Decreased coordination, Decreased activity tolerance, Decreased endurance, Decreased strength, Decreased balance, Decreased safety awareness, Difficulty walking  Visit Diagnosis: Muscle weakness (generalized)  Other  lack of coordination  Difficulty in walking, not elsewhere classified  Unsteadiness on feet     Problem List Patient Active Problem List   Diagnosis Date Noted  . Sleep disturbance   . PAF (paroxysmal atrial fibrillation) ([place])   . Recurrent UTI   . Hypokalemia   . Hyponatremia   . Abnormal urine   . 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 Benign essential HTN   . Urinary retention   . History of gout   . Global aphasia   . SAH (subarachnoid hemorrhage) ([place])   . SDH (subdural hematoma) ([place])   . Lymphocytosis   . Subdural hemorrhage following injury ([place]) 05/12/2016    Alanson Puls, PT DPT 02/26/2018, 11:50 AM  [place] MAIN [facility] SERVICES [address] [place], [place], [address] Phone: [phone]   Fax:  [phone]  Name: James [name] MRN: [id number] Date of Birth: Jul 31, 1940

## 2018-02-28 ENCOUNTER — Encounter: Payer: Self-pay | Admitting: Physical Therapy

## 2018-02-28 ENCOUNTER — Ambulatory Visit: Payer: PPO | Admitting: Physical Therapy

## 2018-02-28 ENCOUNTER — Ambulatory Visit: Payer: PPO | Admitting: Occupational Therapy

## 2018-02-28 DIAGNOSIS — R414 Neurologic neglect syndrome: Secondary | ICD-10-CM

## 2018-02-28 DIAGNOSIS — M6281 Muscle weakness (generalized): Secondary | ICD-10-CM | POA: Diagnosis not present

## 2018-02-28 DIAGNOSIS — I609 Nontraumatic subarachnoid hemorrhage, unspecified: Secondary | ICD-10-CM

## 2018-02-28 DIAGNOSIS — R262 Difficulty in walking, not elsewhere classified: Secondary | ICD-10-CM

## 2018-02-28 DIAGNOSIS — R278 Other lack of coordination: Secondary | ICD-10-CM

## 2018-02-28 DIAGNOSIS — R2681 Unsteadiness on feet: Secondary | ICD-10-CM

## 2018-02-28 NOTE — Therapy (Signed)
[place] MAIN [facility] SERVICES [address] [place], [place], [address] Phone: [phone]   Fax:  [phone]  Physical Therapy Treatment  Patient Details  Name: James [name] MRN: [id number] Date of Birth: [birth year] Referring Provider: shah   Encounter Date: 02/28/2018  PT End of Session - 02/28/18 0933    Visit Number  2    Number of Visits  25    Date for PT Re-Evaluation  05/21/18    Authorization Type  2/10 starting on 02/26/18    PT Start Time  0930    PT Stop Time  1010    PT Time Calculation (min)  40 min    Equipment Utilized During Treatment  Gait belt    Activity Tolerance  Patient tolerated treatment well       Past Medical History:  Diagnosis Date  . Alcohol abuse, in remission   . Atrial fibrillation ([place])   . Bilateral renal masses   . Closed right ankle fracture   . 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 Gait disorder   . Gout   . Hypertension   . Seizure disorder ([place])   . 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     Past Surgical History:  Procedure Laterality Date  . CRANIECTOMY FOR DEPRESSED SKULL FRACTURE  2009   with MRSA infection  . CRANIOTOMY     X 2  . HERNIA REPAIR     during childhood  . ORIF ANKLE FRACTURE Left 2012    There were no vitals filed for this visit.  Subjective Assessment - 02/28/18 0933    Subjective  Patient is having more staggering in his walking and his gait is unsteady.     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  He suffered another fall in 2017.  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    Patient Stated Goals  Patient wants to have a safer gait and not stagger or feel off balance.     Currently in Pain?  No/denies    Multiple Pain Sites  No       NEUROMUSCULAR  RE-EDUCATION  Eccentric step downs from 6 inch stool x 10 x 2 BLE  Fwd stepping to stepping stone from flat surface x 10 , CGA  Lateral stepping to stepping stones from flat surface x 10 , CGA   Toe tapping 6 inch stool without UE assist, min assist   Side stepping on blue foam balance beam x 5 lengths of the parallel bars   Matrix fwd/bwd, side to side with 22. 5 lbs and mod assist x 3 each way, needs min VCs to increase step length for better foot clearance; pt demonstrates increased posterior lean with stepping backwards requiring cues to improve forward weight shift for increased stance control;  Standing with hip abd x 10 x 2 with GTB   Standing with hip ext x 10 x 2 with GTB   Heel raises x 20    Patient needs VC and demonstration for correct performance of exercises.                      PT Education - 02/28/18 0933    Education Details  HEP    Person(s) Educated  Patient    Methods  Explanation    Comprehension  Verbalized understanding       PT Short Term Goals - 02/26/18 1128      PT SHORT TERM GOAL #1   Title  Patient will be independent in home exercise program to improve strength/mobility for better functional independence with ADLs.    Time  6    Period  Weeks    Status  New    Target Date  04/09/18      PT SHORT TERM GOAL #2   Title  Patient (> 4 years old) will complete five times sit to stand test in < 15 seconds indicating an increased LE strength and improved balance.    Baseline  19.15 sec and needs CGA to min assist to keep from falling    Time  6    Period  Weeks    Status  New    Target Date  04/09/18        PT Long Term Goals - 02/26/18 1129      PT LONG TERM GOAL #1   Title  Patient will increase Berg Balance score by > 6 points to demonstrate decreased fall risk during functional activities.    Baseline  36/56 Berg    Time  12    Period  Weeks    Status  New    Target Date  05/21/18      PT LONG TERM GOAL #2    Title  Patient will reduce timed up and go to <11 seconds to reduce fall risk and demonstrate improved transfer/gait ability.    Baseline  15.18 sec with poor safety judgement and uncontrolled sit    Time  12    Period  Weeks    Status  New    Target Date  05/21/18      PT LONG TERM GOAL #3   Title  Patient will increase dynamic gait index score to >19/24 as to demonstrate reduced fall risk and improved dynamic gait balance for better safety with community/home ambulation.     Baseline  16/24    Time  12    Period  Weeks    Status  New    Target Date  05/21/18            Plan - 02/28/18 0936    Clinical Impression Statement  Dynamic and static balance interventions performed today, with a focus on  LE stability.  Pt tolerated all exercises well.  Intermediate dynamic standing balance tasks were performed with min A  needed for reaching outside of her base of support.  Pt would continue to benefit from skilled therapy services in order to address balance deficits in order to decrease fall risk and improve mobility.    Rehab Potential  Good    PT Frequency  2x / week    PT Duration  12 weeks    PT Treatment/Interventions  Therapeutic activities;Therapeutic exercise;Balance training;Neuromuscular re-education;Functional mobility training;Gait training;Manual techniques    PT Next Visit Plan  balance and HEP    PT Home Exercise Plan  heel raises, bridges    Consulted and Agree with Plan of Care  Patient;Family member/caregiver       Patient will benefit from skilled therapeutic intervention in order to improve the following deficits and impairments:  Abnormal gait, Decreased coordination, Decreased activity tolerance, Decreased endurance, Decreased strength, Decreased balance, Decreased safety awareness, Difficulty walking  Visit Diagnosis: Muscle weakness (generalized)  Other lack of  coordination  Difficulty in walking, not elsewhere classified  Unsteadiness on  feet  Neurologic neglect syndrome  SAH (subarachnoid hemorrhage) ([place])     Problem List Patient Active Problem List   Diagnosis Date Noted  . Sleep disturbance   . PAF (paroxysmal atrial fibrillation) ([place])   . Recurrent UTI   . Hypokalemia   . Hyponatremia   . Abnormal urine   . 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 Benign essential HTN   . Urinary retention   . History of gout   . Global aphasia   . SAH (subarachnoid hemorrhage) ([place])   . SDH (subdural hematoma) ([place])   . Lymphocytosis   . Subdural hemorrhage following injury ([place]) 05/12/2016    Alanson Puls, PT DPT 02/28/2018, 9:37 AM  [place] MAIN [facility] SERVICES [address] [place], [place], [address] Phone: [phone]   Fax:  [phone]  Name: James [name] MRN: [id number] Date of Birth: Jun 24, 1941

## 2018-02-28 NOTE — Patient Instructions (Signed)
Heel Raise: Bilateral (Standing)    Rise on balls of feet. Repeat 20____ times per set. Do ___2_ sets per session. Do __7 sessions per day.  http://orth.exer.us/38   Copyright  VHI. All rights reserved.  Hip Extension: Standing (Single Leg)     Pull  leg back, keeping knee nearly straight. Repeat _15_ times per set. Repeat with other leg. Do 2__ sets per session. Do _7_ sessions per week.  http://tub.exer.us/193   Copyright  VHI. All rights reserved.  HIP: Abduction - Standing (Band)    Place band around legs.  Raise leg out and slightly back.  _15__ reps per set, _2_ sets per day, _7__ days per week Hold onto a support.  Copyright  VHI. All rights reserved.

## 2018-03-01 ENCOUNTER — Encounter: Payer: Self-pay | Admitting: Occupational Therapy

## 2018-03-01 NOTE — Therapy (Signed)
[place] MAIN [facility] SERVICES [address] [place], [place], [address] Phone: [phone]   Fax:  [phone]  Occupational Therapy Treatment  Patient Details  Name: James [name] MRN: [id number] Date of Birth: 09-Aug-1940 Referring Provider: shah   Encounter Date: 02/26/2018  OT End of Session - 03/01/18 1024    Visit Number  46    Number of Visits  60    Date for OT Re-Evaluation  03/15/18    Authorization Type  Medicare    OT Start Time  1200    OT Stop Time  1247    OT Time Calculation (min)  47 min    Activity Tolerance  Patient tolerated treatment well    Behavior During Therapy  [facility] for tasks assessed/performed       Past Medical History:  Diagnosis Date  . Alcohol abuse, in remission   . Atrial fibrillation ([place])   . Bilateral renal masses   . Closed right ankle fracture   . 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 Gait disorder   . Gout   . Hypertension   . Seizure disorder ([place])   . 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     Past Surgical History:  Procedure Laterality Date  . CRANIECTOMY FOR DEPRESSED SKULL FRACTURE  2009   with MRSA infection  . CRANIOTOMY     X 2  . HERNIA REPAIR     during childhood  . ORIF ANKLE FRACTURE Left 2012    There were no vitals filed for this visit.  Subjective Assessment - 03/01/18 1024    Subjective   Had PT today for evaluation and denies any falls since he was here last.     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  He suffered another fall in 2017.  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     Patient Stated Goals  to be able to use the right hand functionally    Currently in Pain?  No/denies    Multiple Pain Sites  No        Patient seen for resistive pinch to pick  up and place pins onto elevated surface, all levels, some difficulty noted At the highest level of black.  Cues occasionally for level of pressure on pin. Manipulation of push pins to place into moderate resistive board.  Patient has difficulty with picking up small pins from the table  Able to take from therapist if handed pin, cues for prehension patterns.  Switched to small less resistive board and  Able to place.   Manipulation of [place] discs with cues to pick up with middle finger and thumb and use index finger for turning and  Flipping object.  Worked towards isolated finger movements of the index finger.                     OT Education - 03/01/18 1024    Education provided  Yes    Education Details  graded pressure exercises    Person(s) Educated  Patient;Spouse    Methods  Explanation;Demonstration;Verbal cues    Comprehension  Verbal cues required;Returned demonstration;Verbalized understanding                 Plan - 03/01/18 1025  Clinical Impression Statement  Patient demonstrates difficulty with moderate resistance push pins into bulletin board, could place in less resistance board, has difficulty picking up pins from table, can take the pin from therapist if handed it.  Patient continues to work towards adjusting pressure of right hand when picking up and manipulating objects. Continue to work towards normalizing tone and improving right hand function.     Occupational Profile and client history currently impacting functional performance  repeated falls, TBI, expressive aphasia, limited motion in right hand, lack of coordination from previous CVA    Occupational performance deficits (Please refer to evaluation for details):  ADL's;IADL's;Leisure    Rehab Potential  Good    Current Impairments/barriers affecting progress:  expressive aphasia, falls, neglect    OT Frequency  2x / week    OT Duration  12 weeks    OT Treatment/Interventions   Self-care/ADL training;Visual/perceptual remediation/compensation;DME and/or AE instruction;Patient/family education;Moist Heat;Therapeutic exercise;Manual Therapy;Therapeutic activities;Neuromuscular education    Consulted and Agree with Plan of Care  Patient       Patient will benefit from skilled therapeutic intervention in order to improve the following deficits and impairments:  Impaired vision/preception, Decreased coordination, Decreased safety awareness, Impaired UE functional use, Decreased strength  Visit Diagnosis: Muscle weakness (generalized)  Other lack of coordination  Neurologic neglect syndrome    Problem List Patient Active Problem List   Diagnosis Date Noted  . Sleep disturbance   . PAF (paroxysmal atrial fibrillation) ([place])   . Recurrent UTI   . Hypokalemia   . Hyponatremia   . Abnormal urine   . 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 Benign essential HTN   . Urinary retention   . History of gout   . Global aphasia   . SAH (subarachnoid hemorrhage) ([place])   . SDH (subdural hematoma) ([place])   . Lymphocytosis   . Subdural hemorrhage following injury ([place]) 05/12/2016   Akiko Schexnider T Barney Russomanno, OTR/L, CLT  Cordelro Gautreau 03/01/2018, 10:29 AM  [place] MAIN [facility] SERVICES [address] [place], [place], [address] Phone: [phone]   Fax:  [phone]  Name: James [name] MRN: [id number] Date of Birth: Mar 01, 1941

## 2018-03-05 ENCOUNTER — Encounter: Payer: Self-pay | Admitting: Occupational Therapy

## 2018-03-05 ENCOUNTER — Encounter: Payer: Self-pay | Admitting: Physical Therapy

## 2018-03-05 ENCOUNTER — Ambulatory Visit: Payer: PPO | Admitting: Occupational Therapy

## 2018-03-05 ENCOUNTER — Ambulatory Visit: Payer: PPO | Admitting: Physical Therapy

## 2018-03-05 DIAGNOSIS — M6281 Muscle weakness (generalized): Secondary | ICD-10-CM

## 2018-03-05 DIAGNOSIS — I609 Nontraumatic subarachnoid hemorrhage, unspecified: Secondary | ICD-10-CM

## 2018-03-05 DIAGNOSIS — R2681 Unsteadiness on feet: Secondary | ICD-10-CM

## 2018-03-05 DIAGNOSIS — R262 Difficulty in walking, not elsewhere classified: Secondary | ICD-10-CM

## 2018-03-05 DIAGNOSIS — R414 Neurologic neglect syndrome: Secondary | ICD-10-CM

## 2018-03-05 DIAGNOSIS — R278 Other lack of coordination: Secondary | ICD-10-CM

## 2018-03-05 NOTE — Therapy (Signed)
[place] MAIN [facility] SERVICES [address] [place], [place], [address] Phone: [phone]   Fax:  [phone]  Occupational Therapy Treatment  Patient Details  Name: James [name] MRN: [id number] Date of Birth: 1941-05-02 Referring Provider: shah   Encounter Date: 03/05/2018  OT End of Session - 03/05/18 0913    Visit Number  48    Number of Visits  60    Date for OT Re-Evaluation  03/15/18    Authorization Type  Medicare    OT Start Time  0831    OT Stop Time  0915    OT Time Calculation (min)  44 min    Activity Tolerance  Patient tolerated treatment well    Behavior During Therapy  [facility] for tasks assessed/performed       Past Medical History:  Diagnosis Date  . Alcohol abuse, in remission   . Atrial fibrillation ([place])   . Bilateral renal masses   . Closed right ankle fracture   . 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 Gait disorder   . Gout   . Hypertension   . Seizure disorder ([place])   . 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     Past Surgical History:  Procedure Laterality Date  . CRANIECTOMY FOR DEPRESSED SKULL FRACTURE  2009   with MRSA infection  . CRANIOTOMY     X 2  . HERNIA REPAIR     during childhood  . ORIF ANKLE FRACTURE Left 2012    There were no vitals filed for this visit.  Subjective Assessment - 03/05/18 0906    Subjective   Pt indicating he had a good weekend, denies any falls, making gestures to say he has been working on his right hand prehension to pick up items.     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  He suffered another fall in 2017.  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     Patient Stated Goals  to be able to use the right hand functionally    Currently in Pain?  No/denies    Multiple Pain Sites  No          Patient seen this date for focus on pattern design copy with occasional cues for scanning to the right side to attend to pattern. Patient completing complex pattern with jumbo judy board and variety of colors and placements.  When he thought he was finished,  He required cues to go back and add 4 that were missing, all 4 were at the top of the grid, 1/2 to the right and 1/2 to the left.  Provided cues to  Review design and he was able to detect his mistake and correct.  Cues at times for turning and flipping of objects.                  OT Education - 03/05/18 0912    Education provided  Yes    Education Details  manipulation skills    Person(s) Educated  Patient;Spouse    Methods  Explanation;Demonstration;Verbal cues    Comprehension  Verbal cues required;Returned demonstration;Verbalized understanding          OT Long Term Goals - 01/17/18 0909      OT LONG TERM GOAL #  1   Title  Patient will improve right hand function to hold utensil in right hand for self feeding with modified independence.     Baseline  unable to hold utensil at eval, now able to hold utensils and self feed with minimal spillage.    Time  12    Period  Weeks    Status  Achieved      OT LONG TERM GOAL #2   Title  Pt will increase hand strength in right by 5# to be able to cut food with modified independence.    Baseline  unable at eval, able to cut meat with occasional assist at 30 visit.    Time  12    Period  Weeks    Status  Achieved      OT LONG TERM GOAL #3   Title  Patient will demonstrate increased awareness and strategies to attend to right side to hold items without dropping items.    Baseline  drops items frequently, 30 visit-patient dropping objects less frequently and is more aware when holding objects in hand    Time  12    Period  Weeks    Status  On-going      OT LONG TERM GOAL #4   Title  Patient will demonstrate independence in home  exercise program for ROM, strength and coordination of right hand.    Baseline  no current program, modified independent with current program at 30 visit but continuing to add exercises.    Time  6    Period  Weeks    Status  On-going      OT LONG TERM GOAL #5   Title  Patient will demonstrate the ability to obtain items from right pocket 90% of the time.    Baseline  difficulty getting keys or coins out of right pocket, 30 visit update-able to demonstrate 75% of the time    Time  12    Period  Weeks    Status  On-going      OT LONG TERM GOAL #6   Title  Patient will demonstrate pouring a drink with the right hand with attention to right side and not knocking the cup over and spilling.      Baseline  patient often does not realize the cup is on his right side and knocks it over when attempting to get a drink    Time  12    Period  Weeks    Status  New            Plan - 03/05/18 0913    Clinical Impression Statement  Patient does well with pattern design, he required occasional cues to attend to the right and had difficulty with the top row of the design this date.  Once he was provided with cues and compared the design to his, he was able to detect mistake and correct. Some difficulty with turning items to place into grid.     Occupational Profile and client history currently impacting functional performance  repeated falls, TBI, expressive aphasia, limited motion in right hand, lack of coordination from previous CVA    Occupational performance deficits (Please refer to evaluation for details):  ADL's;IADL's;Leisure    Rehab Potential  Good    Current Impairments/barriers affecting progress:  expressive aphasia, falls, neglect    OT Frequency  2x / week    OT Duration  12 weeks    OT Treatment/Interventions  Self-care/ADL training;Visual/perceptual remediation/compensation;DME and/or AE instruction;Patient/family  education;Moist Heat;Therapeutic exercise;Manual Therapy;Therapeutic  activities;Neuromuscular education    Consulted and Agree with Plan of Care  Patient    Family Member Consulted  wife, [name]       Patient will benefit from skilled therapeutic intervention in order to improve the following deficits and impairments:  Impaired vision/preception, Decreased coordination, Decreased safety awareness, Impaired UE functional use, Decreased strength  Visit Diagnosis: Muscle weakness (generalized)  Other lack of coordination  Neurologic neglect syndrome    Problem List Patient Active Problem List   Diagnosis Date Noted  . Sleep disturbance   . PAF (paroxysmal atrial fibrillation) ([place])   . Recurrent UTI   . Hypokalemia   . Hyponatremia   . Abnormal urine   . 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 Benign essential HTN   . Urinary retention   . History of gout   . Global aphasia   . SAH (subarachnoid hemorrhage) ([place])   . SDH (subdural hematoma) ([place])   . Lymphocytosis   . Subdural hemorrhage following injury [facility]) 05/12/2016   Amy T Lovett, OTR/L, CLT  Lovett,Amy 03/06/2018, 4:01 PM  [place] MAIN [facility] SERVICES [address] [place], [place], [address] Phone: [phone]   Fax:  [phone]  Name: James [name] MRN: [id number] Date of Birth: [birth year]

## 2018-03-05 NOTE — Therapy (Signed)
[place] MAIN [facility] SERVICES [address] [place], [place], [address] Phone: [phone]   Fax:  [phone]  Physical Therapy Treatment  Patient Details  Name: James [name] MRN: [id number] Date of Birth: Jan 03, 1941 Referring Provider: shah   Encounter Date: 03/05/2018  PT End of Session - 03/05/18 0945    Visit Number  3    Number of Visits  25    Date for PT Re-Evaluation  05/21/18    Authorization Type  3/10 starting on 02/26/18    PT Start Time  0935    PT Stop Time  1015    PT Time Calculation (min)  40 min    Equipment Utilized During Treatment  Gait belt    Activity Tolerance  Patient tolerated treatment well    Behavior During Therapy  [facility] for tasks assessed/performed       Past Medical History:  Diagnosis Date  . Alcohol abuse, in remission   . Atrial fibrillation ([place])   . Bilateral renal masses   . Closed right ankle fracture   . 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 Gait disorder   . Gout   . Hypertension   . Seizure disorder ([place])   . 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     Past Surgical History:  Procedure Laterality Date  . CRANIECTOMY FOR DEPRESSED SKULL FRACTURE  2009   with MRSA infection  . CRANIOTOMY     X 2  . HERNIA REPAIR     during childhood  . ORIF ANKLE FRACTURE Left 2012    There were no vitals filed for this visit.  Subjective Assessment - 03/05/18 0944    Subjective  Patient is having more staggering in his walking and his gait is unsteady. Today his wife is reporting that he has slowed down his walking and is safer.     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  He suffered another fall in 2017.  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    Patient Stated Goals  Patient wants to have  a safer gait and not stagger or feel off balance.     Currently in Pain?  No/denies    Multiple Pain Sites  No       NEUROMUSCULAR RE-EDUCATION  Eccentric step downs from 6 inch stool x 10 x 2 BLE  Fwd stepping to disk from foam  surface x 10 x 3 sets , CGA  Lateral stepping to stepping stones from flat surface x 10 , CGA   Toe tapping 6 inch stool without UE assist, min assist  Stepping over hurdle fwd/bwd, and side to side x 15 each direction  Tandem standing on 2 disks with 50% UE support  Normal feet position,  standing on 2 disks with 50% UE support   Side stepping on blue foam balance beam x 5 lengths of the parallel bars   Matrix fwd/bwd, side to side with 22. 5 lbs and mod assist x 3 each way, needs min VCs to increase step length for better foot clearance; pt demonstrates increased posterior lean with stepping backwards requiring cues to improve forward weight shift for increased stance control;     Patient needs VC and demonstration for correct performance of exercises.  PT Education - 03/05/18 0945    Education Details  safety with gait    Person(s) Educated  Patient    Methods  Explanation;Demonstration;Tactile cues;Verbal cues    Comprehension  Verbalized understanding;Returned demonstration       PT Short Term Goals - 02/26/18 1128      PT SHORT TERM GOAL #1   Title  Patient will be independent in home exercise program to improve strength/mobility for better functional independence with ADLs.    Time  6    Period  Weeks    Status  New    Target Date  04/09/18      PT SHORT TERM GOAL #2   Title  Patient (> 10 years old) will complete five times sit to stand test in < 15 seconds indicating an increased LE strength and improved balance.    Baseline  19.15 sec and needs CGA to min assist to keep from falling    Time  6    Period  Weeks    Status  New    Target Date  04/09/18        PT Long Term Goals  - 02/26/18 1129      PT LONG TERM GOAL #1   Title  Patient will increase Berg Balance score by > 6 points to demonstrate decreased fall risk during functional activities.    Baseline  36/56 Berg    Time  12    Period  Weeks    Status  New    Target Date  05/21/18      PT LONG TERM GOAL #2   Title  Patient will reduce timed up and go to <11 seconds to reduce fall risk and demonstrate improved transfer/gait ability.    Baseline  15.18 sec with poor safety judgement and uncontrolled sit    Time  12    Period  Weeks    Status  New    Target Date  05/21/18      PT LONG TERM GOAL #3   Title  Patient will increase dynamic gait index score to >19/24 as to demonstrate reduced fall risk and improved dynamic gait balance for better safety with community/home ambulation.     Baseline  16/24    Time  12    Period  Weeks    Status  New    Target Date  05/21/18            Plan - 03/05/18 0947    Clinical Impression Statement  Pt was able to progress dynamic balance exercises today, noting improved postural reactions with LOB and moving outside normal BOS.  Pt was able to perform all exercises on uneven surfaces with minimal LOB noted.  Single limb stability activities were added today, with decrease control noted when attempting to perform tasks with single leg.  Pt would continue to benefit from skilled therapy services to address further balance impairments and decrease falls risk    Rehab Potential  Good    PT Frequency  2x / week    PT Duration  12 weeks    PT Treatment/Interventions  Therapeutic activities;Therapeutic exercise;Balance training;Neuromuscular re-education;Functional mobility training;Gait training;Manual techniques    PT Next Visit Plan  balance and HEP    PT Home Exercise Plan  heel raises, bridges    Consulted and Agree with Plan of Care  Patient;Family member/caregiver       Patient will benefit from skilled therapeutic intervention in order to improve the  following  deficits and impairments:  Abnormal gait, Decreased coordination, Decreased activity tolerance, Decreased endurance, Decreased strength, Decreased balance, Decreased safety awareness, Difficulty walking  Visit Diagnosis: Muscle weakness (generalized)  Other lack of coordination  Neurologic neglect syndrome  Difficulty in walking, not elsewhere classified  Unsteadiness on feet  SAH (subarachnoid hemorrhage) ([place])     Problem List Patient Active Problem List   Diagnosis Date Noted  . Sleep disturbance   . PAF (paroxysmal atrial fibrillation) ([place])   . Recurrent UTI   . Hypokalemia   . Hyponatremia   . Abnormal urine   . 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 Benign essential HTN   . Urinary retention   . History of gout   . Global aphasia   . SAH (subarachnoid hemorrhage) ([place])   . SDH (subdural hematoma) ([place])   . Lymphocytosis   . Subdural hemorrhage following injury ([place]) 05/12/2016    Alanson Puls, PT DPT 03/05/2018, 9:48 AM  [place] MAIN [facility] SERVICES [address] [place], [place], [address] Phone: [phone]   Fax:  [phone]  Name: James [name] MRN: [id number] Date of Birth: Mar 02, 1941

## 2018-03-05 NOTE — Therapy (Signed)
[place] MAIN [facility] SERVICES [address] [place], [place], [address] Phone: [phone]   Fax:  [phone]  Occupational Therapy Treatment  Patient Details  Name: James [name] MRN: [id number] Date of Birth: [birth year] Referring Provider: shah   Encounter Date: 02/28/2018  OT End of Session - 03/05/18 0849    Visit Number  50    Number of Visits  60    Date for OT Re-Evaluation  03/15/18    Authorization Type  Medicare    OT Start Time  0830    OT Stop Time  0915    OT Time Calculation (min)  45 min    Activity Tolerance  Patient tolerated treatment well    Behavior During Therapy  [facility] for tasks assessed/performed       Past Medical History:  Diagnosis Date  . Alcohol abuse, in remission   . Atrial fibrillation ([place])   . Bilateral renal masses   . Closed right ankle fracture   . 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 Gait disorder   . Gout   . Hypertension   . Seizure disorder ([place])   . 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     Past Surgical History:  Procedure Laterality Date  . CRANIECTOMY FOR DEPRESSED SKULL FRACTURE  2009   with MRSA infection  . CRANIOTOMY     X 2  . HERNIA REPAIR     during childhood  . ORIF ANKLE FRACTURE Left 2012    There were no vitals filed for this visit.  Subjective Assessment - 03/05/18 0848    Subjective   Patient smiling and singing with music today during therapy.  Patient indicates he is doing well despite coming early in the am. 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  He suffered another fall in 2017.  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     Patient Stated Goals  to be able to use the right hand functionally    Currently in Pain?  No/denies    Multiple Pain Sites  No         Patient seen this date for focus on right hand gripping patterns with graded pressure. Patient able to demonstrate modulation  of pressure based on object this date with focus on using visual feedback as a compensatory strategy with cues from therapist.  Engaged in visual scanning task and fine motor activity with picking up checker sized objects to place into grid. Cues for scanning  to the right at times but once cues, he returned to the right on subsequent trials.                     OT Education - 03/05/18 0849    Education provided  Yes    Education Details  scanning to the right side, graded pressure    Person(s) Educated  Patient;Spouse    Methods  Explanation;Demonstration;Verbal cues    Comprehension  Verbal cues required;Returned demonstration;Verbalized understanding          OT Long Term Goals - 01/17/18 0909      OT LONG TERM GOAL #1   Title  Patient will improve right  hand function to hold utensil in right hand for self feeding with modified independence.     Baseline  unable to hold utensil at eval, now able to hold utensils and self feed with minimal spillage.    Time  12    Period  Weeks    Status  Achieved      OT LONG TERM GOAL #2   Title  Pt will increase hand strength in right by 5# to be able to cut food with modified independence.    Baseline  unable at eval, able to cut meat with occasional assist at 30 visit.    Time  12    Period  Weeks    Status  Achieved      OT LONG TERM GOAL #3   Title  Patient will demonstrate increased awareness and strategies to attend to right side to hold items without dropping items.    Baseline  drops items frequently, 30 visit-patient dropping objects less frequently and is more aware when holding objects in hand    Time  12    Period  Weeks    Status  On-going      OT LONG TERM GOAL #4   Title  Patient will demonstrate independence  in home exercise program for ROM, strength and coordination of right hand.    Baseline  no current program, modified independent with current program at 30 visit but continuing to add exercises.    Time  6    Period  Weeks    Status  On-going      OT LONG TERM GOAL #5   Title  Patient will demonstrate the ability to obtain items from right pocket 90% of the time.    Baseline  difficulty getting keys or coins out of right pocket, 30 visit update-able to demonstrate 75% of the time    Time  12    Period  Weeks    Status  On-going      OT LONG TERM GOAL #6   Title  Patient will demonstrate pouring a drink with the right hand with attention to right side and not knocking the cup over and spilling.      Baseline  patient often does not realize the cup is on his right side and knocks it over when attempting to get a drink    Time  12    Period  Weeks    Status  New            Plan - 03/05/18 0850    Clinical Impression Statement  Patient continues to progress with right hand use. Wife reports he is going frat with his new coin purse and money holder. He responds quickly to feedback and cues and demonstrates carryover into subsequent trials. Continue to work towards goals to increase independence in necessary daily tasks.     Occupational Profile and client history currently impacting functional performance  repeated falls, TBI, expressive aphasia, limited motion in right hand, lack of coordination from previous CVA    Occupational performance deficits (Please refer to evaluation for details):  ADL's;IADL's;Leisure    Rehab Potential  Good    Current Impairments/barriers affecting progress:  expressive aphasia, falls, neglect    OT Frequency  2x / week    OT Duration  12 weeks    OT Treatment/Interventions  Self-care/ADL training;Visual/perceptual remediation/compensation;DME and/or AE instruction;Patient/family education;Moist Heat;Therapeutic exercise;Manual Therapy;Therapeutic  activities;Neuromuscular education    Consulted and Agree with Plan of Care  Patient  Family Member Consulted  wife, [name]       Patient will benefit from skilled therapeutic intervention in order to improve the following deficits and impairments:  Impaired vision/preception, Decreased coordination, Decreased safety awareness, Impaired UE functional use, Decreased strength  Visit Diagnosis: Muscle weakness (generalized)  Other lack of coordination  Neurologic neglect syndrome    Problem List Patient Active Problem List   Diagnosis Date Noted  . Sleep disturbance   . PAF (paroxysmal atrial fibrillation) ([place])   . Recurrent UTI   . Hypokalemia   . Hyponatremia   . Abnormal urine   . 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 Benign essential HTN   . Urinary retention   . History of gout   . Global aphasia   . SAH (subarachnoid hemorrhage) ([place])   . SDH (subdural hematoma) ([place])   . Lymphocytosis   . Subdural hemorrhage following injury ([place]) 05/12/2016   Amy T Lovett, OTR/L, CLT  Lovett,Amy 03/05/2018, 9:03 AM  [place] MAIN [facility] SERVICES [address] [place], [place], [address] Phone: [phone]   Fax:  [phone]  Name: James [name] MRN: [id number] Date of Birth: 1940-12-06

## 2018-03-07 ENCOUNTER — Ambulatory Visit: Payer: PPO

## 2018-03-07 VITALS — BP 112/66 | HR 60

## 2018-03-07 DIAGNOSIS — M6281 Muscle weakness (generalized): Secondary | ICD-10-CM | POA: Diagnosis not present

## 2018-03-07 DIAGNOSIS — R278 Other lack of coordination: Secondary | ICD-10-CM

## 2018-03-07 DIAGNOSIS — R2681 Unsteadiness on feet: Secondary | ICD-10-CM

## 2018-03-07 NOTE — Therapy (Signed)
[place] MAIN [facility] SERVICES [address] [place], [place], [address] Phone: [phone]   Fax:  [phone]  Physical Therapy Treatment  Patient Details  Name: James [name] MRN: [id number] Date of Birth: 15-Jan-1941 Referring Provider: shah   Encounter Date: 03/07/2018  PT End of Session - 03/07/18 0940    Visit Number  4    Number of Visits  25    Date for PT Re-Evaluation  05/21/18    Authorization Type  4/10 starting on 02/26/18    PT Start Time  0931    PT Stop Time  1015    PT Time Calculation (min)  44 min    Equipment Utilized During Treatment  Gait belt    Activity Tolerance  Patient tolerated treatment well    Behavior During Therapy  [facility] for tasks assessed/performed       Past Medical History:  Diagnosis Date  . Alcohol abuse, in remission   . Atrial fibrillation ([place])   . Bilateral renal masses   . Closed right ankle fracture   . 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 Gait disorder   . Gout   . Hypertension   . Seizure disorder ([place])   . 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     Past Surgical History:  Procedure Laterality Date  . CRANIECTOMY FOR DEPRESSED SKULL FRACTURE  2009   with MRSA infection  . CRANIOTOMY     X 2  . HERNIA REPAIR     during childhood  . ORIF ANKLE FRACTURE Left 2012    Vitals:   03/07/18 0934  BP: 112/66  Pulse: 60  SpO2: 99%    Subjective Assessment - 03/07/18 0939    Subjective  Pt reports that he is doing well today. No specific questions or concerns. Denies pain and no external signs of discomfort. Wife denies any health changes since last visit.    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  He suffered another fall in 2017.  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     Patient Stated Goals  Patient wants to have a safer gait and not stagger or feel off balance.     Currently in Pain?  No/denies         TREATMENT  NEUROMUSCULAR RE-EDUCATION NuStep L4 x 4 minutes for warm-up (unbilled); Matrix fwd/bwd, side to side with 22.5 lbs andmod assist x 3 each way except for only performed x 2 during L sidestepping. Pt demonstrates poor safety awareness, ataxic stepping, and general lack of safety during sidestepping both ways. Intermittent cues to slow down and concentrate on proper safe stepping.  Toe tapping 6 inch stool without UE assist, min assist with pt frequently losing balance while in single leg stance on RLE; Stepping over hurdle fwd/bwd, and side to side x 15 each direction Side stepping on blue foam balance beam x 5 lengths of the parallel bars Tandem balance on Airex without UE support alternating forward LE 30s x 2 each; Static balance on 1/2 bolster (flat side up) with no UE support 30s x 2; Standing on 1/2 bolster (Flat side up) heel/toe rock x 15 without UE support;  PT Education - 03/07/18 0940    Education Details  exercise form/technique    Person(s) Educated  Patient    Methods  Explanation    Comprehension  Verbalized understanding       PT Short Term Goals - 02/26/18 1128      PT SHORT TERM GOAL #1   Title  Patient will be independent in home exercise program to improve strength/mobility for better functional independence with ADLs.    Time  6    Period  Weeks    Status  New    Target Date  04/09/18      PT SHORT TERM GOAL #2   Title  Patient (> 68 years old) will complete five times sit to stand test in < 15 seconds indicating an increased LE strength and improved balance.    Baseline  19.15 sec and needs CGA to min assist to keep from falling    Time  6    Period  Weeks    Status  New    Target Date  04/09/18        PT Long Term Goals - 02/26/18 1129      PT LONG TERM GOAL #1    Title  Patient will increase Berg Balance score by > 6 points to demonstrate decreased fall risk during functional activities.    Baseline  36/56 Berg    Time  12    Period  Weeks    Status  New    Target Date  05/21/18      PT LONG TERM GOAL #2   Title  Patient will reduce timed up and go to <11 seconds to reduce fall risk and demonstrate improved transfer/gait ability.    Baseline  15.18 sec with poor safety judgement and uncontrolled sit    Time  12    Period  Weeks    Status  New    Target Date  05/21/18      PT LONG TERM GOAL #3   Title  Patient will increase dynamic gait index score to >19/24 as to demonstrate reduced fall risk and improved dynamic gait balance for better safety with community/home ambulation.     Baseline  16/24    Time  12    Period  Weeks    Status  New    Target Date  05/21/18            Plan - 03/07/18 1610    Clinical Impression Statement  Pt demonstrates poor safety awareness, ataxic stepping, and general lack of safety during balance exercises today. He requires frequent verbal and tactile cues as well as demonstration to perform exercises correctly. He struggles on unstable surfaces and in single leg stance especially on the right side. Pt would continue to benefit from skilled therapy services to address further balance impairments and decrease falls risk.    Rehab Potential  Good    PT Frequency  2x / week    PT Duration  12 weeks    PT Treatment/Interventions  Therapeutic activities;Therapeutic exercise;Balance training;Neuromuscular re-education;Functional mobility training;Gait training;Manual techniques    PT Next Visit Plan  balance and HEP    PT Home Exercise Plan  heel raises, bridges    Consulted and Agree with Plan of Care  Patient;Family member/caregiver       Patient will benefit from skilled therapeutic intervention in order to improve the following deficits and impairments:  Abnormal gait, Decreased coordination, Decreased  activity tolerance, Decreased endurance, Decreased  strength, Decreased balance, Decreased safety awareness, Difficulty walking  Visit Diagnosis: Muscle weakness (generalized)  Other lack of coordination  Unsteadiness on feet     Problem List Patient Active Problem List   Diagnosis Date Noted  . Sleep disturbance   . PAF (paroxysmal atrial fibrillation) ([place])   . Recurrent UTI   . Hypokalemia   . Hyponatremia   . Abnormal urine   . 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 Benign essential HTN   . Urinary retention   . History of gout   . Global aphasia   . SAH (subarachnoid hemorrhage) ([place])   . SDH (subdural hematoma) ([place])   . Lymphocytosis   . Subdural hemorrhage following injury ([place]) 05/12/2016   Phillips Grout PT, DPT, GCS  Tate Jerkins 03/07/2018, 11:25 AM  [place] MAIN [facility] SERVICES [address] [place], [place], [address] Phone: [phone]   Fax:  [phone]  Name: James [name] MRN: [id number] Date of Birth: Mar 18, 1941

## 2018-03-12 ENCOUNTER — Encounter: Payer: Self-pay | Admitting: Occupational Therapy

## 2018-03-12 ENCOUNTER — Ambulatory Visit: Payer: PPO | Admitting: Physical Therapy

## 2018-03-12 ENCOUNTER — Ambulatory Visit: Payer: PPO | Admitting: Occupational Therapy

## 2018-03-12 ENCOUNTER — Encounter: Payer: Self-pay | Admitting: Physical Therapy

## 2018-03-12 DIAGNOSIS — S065XAA Traumatic subdural hemorrhage with loss of consciousness status unknown, initial encounter: Secondary | ICD-10-CM

## 2018-03-12 DIAGNOSIS — R278 Other lack of coordination: Secondary | ICD-10-CM

## 2018-03-12 DIAGNOSIS — M6281 Muscle weakness (generalized): Secondary | ICD-10-CM

## 2018-03-12 DIAGNOSIS — I609 Nontraumatic subarachnoid hemorrhage, unspecified: Secondary | ICD-10-CM

## 2018-03-12 DIAGNOSIS — R262 Difficulty in walking, not elsewhere classified: Secondary | ICD-10-CM

## 2018-03-12 DIAGNOSIS — S065X9A Traumatic subdural hemorrhage with loss of consciousness of unspecified duration, initial encounter: Secondary | ICD-10-CM

## 2018-03-12 DIAGNOSIS — R414 Neurologic neglect syndrome: Secondary | ICD-10-CM

## 2018-03-12 DIAGNOSIS — R2681 Unsteadiness on feet: Secondary | ICD-10-CM

## 2018-03-12 NOTE — Therapy (Signed)
[place] MAIN [facility] SERVICES [address] [place], [place], [address] Phone: [phone]   Fax:  [phone]  Physical Therapy Treatment  Patient Details  Name: James [name] MRN: [id number] Date of Birth: 1940/09/02 Referring Provider: shah   Encounter Date: 03/12/2018  PT End of Session - 03/12/18 1111    Visit Number  5    Number of Visits  25    Date for PT Re-Evaluation  05/21/18    Authorization Type  5/10 starting on 02/26/18    Equipment Utilized During Treatment  Gait belt    Activity Tolerance  Patient tolerated treatment well    Behavior During Therapy  [facility] for tasks assessed/performed       Past Medical History:  Diagnosis Date  . Alcohol abuse, in remission   . Atrial fibrillation ([place])   . Bilateral renal masses   . Closed right ankle fracture   . 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 Gait disorder   . Gout   . Hypertension   . Seizure disorder ([place])   . 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     Past Surgical History:  Procedure Laterality Date  . CRANIECTOMY FOR DEPRESSED SKULL FRACTURE  2009   with MRSA infection  . CRANIOTOMY     X 2  . HERNIA REPAIR     during childhood  . ORIF ANKLE FRACTURE Left 2012    There were no vitals filed for this visit.  Subjective Assessment - 03/12/18 1103    Subjective  Pt reports that he is doing well today. No specific questions or concerns. Denies pain and no external signs of discomfort. Wife denies any health changes since last visit.    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  He suffered another fall in 2017.  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    Patient Stated Goals  Patient wants to have a safer gait and not stagger or feel off balance.     Currently  in Pain?  No/denies    Multiple Pain Sites  No         TREATMENT  NEUROMUSCULAR RE-EDUCATION [employer]  L4 x 5 minutes for warm-up (unbilled);  Matrix fwd/bwd, side to side with 22.5 lbs andmod assist x 3 each way except for only performed x 2 during L sidestepping. Pt demonstrates poor safety awareness, ataxic stepping, and general lack of safety during sidestepping both ways. Intermittent cues to slow down and concentrate on proper safe stepping.   Toe tapping 6 inch stool without UE assist, min assist with pt frequently losing balance while in single leg stance on RLE  Stepping over hurdle fwd/bwd, and side to side x 15 each direction  Side stepping on blue foam balance beam x 5 lengths of the parallel bars  Tandem balance on Airex without UE support alternating forward LE 30s x 2 each;  Static balance on 1/2 bolster (flat side up) with no UE support 30s x 2;  Standing on 1/2 bolster (Flat side up) heel/toe rock x 15 without UE support;   Patient needs HHA and UE support 75 % of the time    Therapeutic exercise:  Leg press 100 lbs x 20 x 2  Step ups to 6 inch  stool x 20 BUE  cues for correct secuencing and technique               PT Education - 03/12/18 1111    Education Details  HEP    Person(s) Educated  Patient    Methods  Explanation    Comprehension  Verbalized understanding       PT Short Term Goals - 02/26/18 1128      PT SHORT TERM GOAL #1   Title  Patient will be independent in home exercise program to improve strength/mobility for better functional independence with ADLs.    Time  6    Period  Weeks    Status  New    Target Date  04/09/18      PT SHORT TERM GOAL #2   Title  Patient (> 84 years old) will complete five times sit to stand test in < 15 seconds indicating an increased LE strength and improved balance.    Baseline  19.15 sec and needs CGA to min assist to keep from falling    Time  6    Period  Weeks    Status  New     Target Date  04/09/18        PT Long Term Goals - 02/26/18 1129      PT LONG TERM GOAL #1   Title  Patient will increase Berg Balance score by > 6 points to demonstrate decreased fall risk during functional activities.    Baseline  36/56 Berg    Time  12    Period  Weeks    Status  New    Target Date  05/21/18      PT LONG TERM GOAL #2   Title  Patient will reduce timed up and go to <11 seconds to reduce fall risk and demonstrate improved transfer/gait ability.    Baseline  15.18 sec with poor safety judgement and uncontrolled sit    Time  12    Period  Weeks    Status  New    Target Date  05/21/18      PT LONG TERM GOAL #3   Title  Patient will increase dynamic gait index score to >19/24 as to demonstrate reduced fall risk and improved dynamic gait balance for better safety with community/home ambulation.     Baseline  16/24    Time  12    Period  Weeks    Status  New    Target Date  05/21/18            Plan - 03/12/18 1112    Clinical Impression Statement  Patient instructed in advanced dynamic and static balance exercise. Utilized stable and uneven surfaces to further challenge dynamic balance. Patient required min Vcs for correct positioning and exercise technique! Patient had a difficult time during narrow base of support challenges.  He exhibits better SLS ability being able to progress to foam; Patient would benefit from additional skilled PT Intervention to improve strength, balance and gait safety    Rehab Potential  Good    PT Frequency  2x / week    PT Duration  12 weeks    PT Treatment/Interventions  Therapeutic activities;Therapeutic exercise;Balance training;Neuromuscular re-education;Functional mobility training;Gait training;Manual techniques    PT Next Visit Plan  balance and HEP    PT Home Exercise Plan  heel raises, bridges    Consulted and Agree with Plan of Care  Patient;Family member/caregiver       Patient  will benefit from skilled  therapeutic intervention in order to improve the following deficits and impairments:  Abnormal gait, Decreased coordination, Decreased activity tolerance, Decreased endurance, Decreased strength, Decreased balance, Decreased safety awareness, Difficulty walking  Visit Diagnosis: Muscle weakness (generalized)  Other lack of coordination  Unsteadiness on feet  Neurologic neglect syndrome  Difficulty in walking, not elsewhere classified  SDH (subdural hematoma) ([facility])  SAH (subarachnoid hemorrhage) ([place])     Problem List Patient Active Problem List   Diagnosis Date Noted  . Sleep disturbance   . PAF (paroxysmal atrial fibrillation) ([place])   . Recurrent UTI   . Hypokalemia   . Hyponatremia   . Abnormal urine   . 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 Benign essential HTN   . Urinary retention   . History of gout   . Global aphasia   . SAH (subarachnoid hemorrhage) ([place])   . SDH (subdural hematoma) ([place])   . Lymphocytosis   . Subdural hemorrhage following injury ([place]) 05/12/2016    Alanson Puls, PT DPT 03/12/2018, 11:14 AM  [place] MAIN [facility] SERVICES [address] [place], [place], [address] Phone: [phone]   Fax:  [phone]  Name: James [name] MRN: [id number] Date of Birth: 12-25-1940

## 2018-03-14 ENCOUNTER — Ambulatory Visit: Payer: PPO | Admitting: Occupational Therapy

## 2018-03-14 ENCOUNTER — Ambulatory Visit: Payer: PPO | Admitting: Physical Therapy

## 2018-03-14 ENCOUNTER — Encounter: Payer: Self-pay | Admitting: Physical Therapy

## 2018-03-14 DIAGNOSIS — R414 Neurologic neglect syndrome: Secondary | ICD-10-CM

## 2018-03-14 DIAGNOSIS — M6281 Muscle weakness (generalized): Secondary | ICD-10-CM

## 2018-03-14 DIAGNOSIS — R278 Other lack of coordination: Secondary | ICD-10-CM

## 2018-03-14 DIAGNOSIS — R2681 Unsteadiness on feet: Secondary | ICD-10-CM

## 2018-03-14 DIAGNOSIS — R262 Difficulty in walking, not elsewhere classified: Secondary | ICD-10-CM

## 2018-03-14 NOTE — Therapy (Signed)
[place] MAIN [facility] SERVICES [address] [place], [place], [address] Phone: [phone]   Fax:  [phone]  Physical Therapy Treatment  Patient Details  Name: James [name] MRN: [id number] Date of Birth: 08-Nov-1940 Referring Provider: shah   Encounter Date: 03/14/2018  PT End of Session - 03/14/18 1015    Visit Number  6    Number of Visits  25    Date for PT Re-Evaluation  05/21/18    Authorization Type  6/10 starting on 02/26/18    PT Start Time  1015    PT Stop Time  1100    PT Time Calculation (min)  45 min    Equipment Utilized During Treatment  Gait belt    Activity Tolerance  Patient tolerated treatment well    Behavior During Therapy  [facility] for tasks assessed/performed       Past Medical History:  Diagnosis Date  . Alcohol abuse, in remission   . Atrial fibrillation ([place])   . Bilateral renal masses   . Closed right ankle fracture   . 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 Gait disorder   . Gout   . Hypertension   . Seizure disorder ([place])   . 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     Past Surgical History:  Procedure Laterality Date  . CRANIECTOMY FOR DEPRESSED SKULL FRACTURE  2009   with MRSA infection  . CRANIOTOMY     X 2  . HERNIA REPAIR     during childhood  . ORIF ANKLE FRACTURE Left 2012    There were no vitals filed for this visit.  Subjective Assessment - 03/14/18 1015    Subjective  Pt reports that he is doing well today. No specific questions or concerns. Denies pain and no external signs of discomfort. Wife denies any health changes since last visit.    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  He suffered another fall in 2017.  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    Patient Stated Goals   Patient wants to have a safer gait and not stagger or feel off balance.     Currently in Pain?  No/denies    Multiple Pain Sites  No       Treatment:   Tandem stand flat surface  with head turns x 2 minutes, CGA and cues for posture  Stepping onto AIREX, then on to 4 inch step followed by stepping down onto AIREX and then level surface in //bars x15.Pt required 75% UE assist, and performance improved with each repetition   Four square: Side step and back, diagonals left and diagonals right  x10 bilaterally; CGA  Eccentric step downs x 10 BLE; cues to not put too much weight bearing on UE's  Squats x 10 with 5 sec hold, cues to maintain erect position  Heel raises x 10 x 2 ; cues to not rock forward  Step-ups to 6" step x 10 bilateral; cues for getting entire foot on step  Quantum leg press 100 # x 20 x 3; cues for not snapping LE in extension and performing slowly  Matrix 22. 5 lbs and side stepping left and side stepping right x 5     CGA and  mod verbal cues  used throughout with increased in postural sway and LOB most seen with narrow base of support and while on uneven surfaces. Continues to have balance deficits typical with diagnosis. Patient performs intermediate level exercises without pain behaviors and needs verbal cuing for postural alignment and head positioning                         PT Education - 03/14/18 1015    Education Details  HEP    Person(s) Educated  Patient    Methods  Explanation;Demonstration;Tactile cues    Comprehension  Verbalized understanding       PT Short Term Goals - 02/26/18 1128      PT SHORT TERM GOAL #1   Title  Patient will be independent in home exercise program to improve strength/mobility for better functional independence with ADLs.    Time  6    Period  Weeks    Status  New    Target Date  04/09/18      PT SHORT TERM GOAL #2   Title  Patient (> 52 years old) will complete five times sit to  stand test in < 15 seconds indicating an increased LE strength and improved balance.    Baseline  19.15 sec and needs CGA to min assist to keep from falling    Time  6    Period  Weeks    Status  New    Target Date  04/09/18        PT Long Term Goals - 02/26/18 1129      PT LONG TERM GOAL #1   Title  Patient will increase Berg Balance score by > 6 points to demonstrate decreased fall risk during functional activities.    Baseline  36/56 Berg    Time  12    Period  Weeks    Status  New    Target Date  05/21/18      PT LONG TERM GOAL #2   Title  Patient will reduce timed up and go to <11 seconds to reduce fall risk and demonstrate improved transfer/gait ability.    Baseline  15.18 sec with poor safety judgement and uncontrolled sit    Time  12    Period  Weeks    Status  New    Target Date  05/21/18      PT LONG TERM GOAL #3   Title  Patient will increase dynamic gait index score to >19/24 as to demonstrate reduced fall risk and improved dynamic gait balance for better safety with community/home ambulation.     Baseline  16/24    Time  12    Period  Weeks    Status  New    Target Date  05/21/18            Plan - 03/14/18 1016    Clinical Impression Statement  Dynamic and static balance interventions performed today, with a focus on LE stability. Pt tolerated all exercises well. Intermediate dynamic standing balance tasks were performed with min A needed for reaching outside of her base of support. Pt would continue to benefit from skilled therapy services in order to address balance deficits in order to decrease fall risk and improve mobility    Rehab Potential  Good    PT Frequency  2x / week    PT Duration  12 weeks    PT Treatment/Interventions  Therapeutic activities;Therapeutic exercise;Balance training;Neuromuscular re-education;Functional mobility training;Gait training;Manual techniques  PT Next Visit Plan  balance and HEP    PT Home Exercise Plan  heel  raises, bridges    Consulted and Agree with Plan of Care  Patient;Family member/caregiver       Patient will benefit from skilled therapeutic intervention in order to improve the following deficits and impairments:  Abnormal gait, Decreased coordination, Decreased activity tolerance, Decreased endurance, Decreased strength, Decreased balance, Decreased safety awareness, Difficulty walking  Visit Diagnosis: Muscle weakness (generalized)  Other lack of coordination  Unsteadiness on feet  Neurologic neglect syndrome  Difficulty in walking, not elsewhere classified     Problem List Patient Active Problem List   Diagnosis Date Noted  . Sleep disturbance   . PAF (paroxysmal atrial fibrillation) ([place])   . Recurrent UTI   . Hypokalemia   . Hyponatremia   . Abnormal urine   . 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 Benign essential HTN   . Urinary retention   . History of gout   . Global aphasia   . SAH (subarachnoid hemorrhage) ([place])   . SDH (subdural hematoma) ([place])   . Lymphocytosis   . Subdural hemorrhage following injury ([place]) 05/12/2016    Alanson Puls, PT DPT 03/14/2018, 10:33 AM  [place] MAIN [facility] SERVICES [address] [place], [place], [address] Phone: [phone]   Fax:  [phone]  Name: James [name] MRN: [id number] Date of Birth: 1940-10-13

## 2018-03-19 ENCOUNTER — Encounter: Payer: Self-pay | Admitting: Physical Therapy

## 2018-03-19 ENCOUNTER — Ambulatory Visit: Payer: PPO | Admitting: Occupational Therapy

## 2018-03-19 ENCOUNTER — Ambulatory Visit: Payer: PPO | Admitting: Physical Therapy

## 2018-03-19 DIAGNOSIS — R2681 Unsteadiness on feet: Secondary | ICD-10-CM

## 2018-03-19 DIAGNOSIS — R414 Neurologic neglect syndrome: Secondary | ICD-10-CM

## 2018-03-19 DIAGNOSIS — R278 Other lack of coordination: Secondary | ICD-10-CM

## 2018-03-19 DIAGNOSIS — M6281 Muscle weakness (generalized): Secondary | ICD-10-CM | POA: Diagnosis not present

## 2018-03-19 DIAGNOSIS — R262 Difficulty in walking, not elsewhere classified: Secondary | ICD-10-CM

## 2018-03-19 NOTE — Therapy (Signed)
[place] MAIN [facility] SERVICES [address] [place], [place], [address] Phone: [phone]   Fax:  [phone]  Physical Therapy Treatment  Patient Details  Name: James [name] MRN: [id number] Date of Birth: March 02, 1941 Referring Provider: shah   Encounter Date: 03/19/2018  PT End of Session - 03/19/18 0919    Visit Number  7    Number of Visits  25    Date for PT Re-Evaluation  05/21/18    Authorization Type  7/10 starting on 02/26/18    PT Start Time  0915    PT Stop Time  1000    PT Time Calculation (min)  45 min    Equipment Utilized During Treatment  Gait belt    Activity Tolerance  Patient tolerated treatment well    Behavior During Therapy  [facility] for tasks assessed/performed       Past Medical History:  Diagnosis Date  . Alcohol abuse, in remission   . Atrial fibrillation ([place])   . Bilateral renal masses   . Closed right ankle fracture   . 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 Gait disorder   . Gout   . Hypertension   . Seizure disorder ([place])   . 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     Past Surgical History:  Procedure Laterality Date  . CRANIECTOMY FOR DEPRESSED SKULL FRACTURE  2009   with MRSA infection  . CRANIOTOMY     X 2  . HERNIA REPAIR     during childhood  . ORIF ANKLE FRACTURE Left 2012    There were no vitals filed for this visit.  Subjective Assessment - 03/19/18 0918    Subjective  Pt reports that he is doing well today. No specific questions or concerns. Denies pain and no external signs of discomfort. Wife denies any health changes since last visit.    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  He suffered another fall in 2017.  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    Patient Stated Goals   Patient wants to have a safer gait and not stagger or feel off balance.     Currently in Pain?  No/denies    Multiple Pain Sites  No       NEUROMUSCULAR RE-EDUCATION  Lunging onto BOSU ball with max VC for correct technique and 1 UE assist  Eccentric step downs from 6 inch stool x 10 x 2 BLE  Fwd stepping to disk from foam  surface x 10 x 3 sets , CGA  Lateral stepping to stepping stones from flat surface x 10 , CGA  Toe tapping 6 inch stool without UE assist, min assist  Stepping over 1/2oam  fwd/bwd, and side to side x 15 each direction, very poor coordination and poor motor control  Tandem standing on flor with 50% UE support  Hip 3 way with gTB x 15 BLE  Side stepping on blue foam balance beam x 5 lengths of the parallel bars  Matrix fwd/bwd, side to side with 22. 5 lbs andmod assist x 3 each way, needsmin VCs to increase step length for better foot clearance; pt demonstrates increased posterior lean with stepping backwards requiring cues to improve forward weight shift for increased stance control;  Patient needs VC and demonstration for correct performance of exercises.                         PT Education - 03/19/18 0919    Education Details  HEP    Person(s) Educated  Patient    Methods  Explanation;Demonstration    Comprehension  Verbalized understanding;Returned demonstration;Verbal cues required       PT Short Term Goals - 02/26/18 1128      PT SHORT TERM GOAL #1   Title  Patient will be independent in home exercise program to improve strength/mobility for better functional independence with ADLs.    Time  6    Period  Weeks    Status  New    Target Date  04/09/18      PT SHORT TERM GOAL #2   Title  Patient (> 80 years old) will complete five times sit to stand test in < 15 seconds indicating an increased LE strength and improved balance.    Baseline  19.15 sec and needs CGA to min assist to keep from falling     Time  6    Period  Weeks    Status  New    Target Date  04/09/18        PT Long Term Goals - 02/26/18 1129      PT LONG TERM GOAL #1   Title  Patient will increase Berg Balance score by > 6 points to demonstrate decreased fall risk during functional activities.    Baseline  36/56 Berg    Time  12    Period  Weeks    Status  New    Target Date  05/21/18      PT LONG TERM GOAL #2   Title  Patient will reduce timed up and go to <11 seconds to reduce fall risk and demonstrate improved transfer/gait ability.    Baseline  15.18 sec with poor safety judgement and uncontrolled sit    Time  12    Period  Weeks    Status  New    Target Date  05/21/18      PT LONG TERM GOAL #3   Title  Patient will increase dynamic gait index score to >19/24 as to demonstrate reduced fall risk and improved dynamic gait balance for better safety with community/home ambulation.     Baseline  16/24    Time  12    Period  Weeks    Status  New    Target Date  05/21/18            Plan - 03/19/18 0920    Clinical Impression Statement  Dynamic and static balance interventions performed today, with a focus on LE stability. Pt tolerated all exercises well. Intermediate dynamic standing balance tasks were performed with min A needed for reaching outside of her base of support. Pt would continue to benefit from skilled therapy services in order to address balance deficits in order to decrease fall risk and improve mobility    Rehab Potential  Good    PT Frequency  2x / week    PT Duration  12 weeks    PT Treatment/Interventions  Therapeutic activities;Therapeutic exercise;Balance training;Neuromuscular re-education;Functional mobility training;Gait training;Manual techniques    PT Next Visit Plan  balance and HEP    PT Home Exercise Plan  heel raises, bridges    Consulted and Agree with Plan of Care  Patient;Family member/caregiver  Patient will benefit from skilled therapeutic intervention in  order to improve the following deficits and impairments:  Abnormal gait, Decreased coordination, Decreased activity tolerance, Decreased endurance, Decreased strength, Decreased balance, Decreased safety awareness, Difficulty walking  Visit Diagnosis: Muscle weakness (generalized)  Other lack of coordination  Neurologic neglect syndrome  Difficulty in walking, not elsewhere classified  Unsteadiness on feet     Problem List Patient Active Problem List   Diagnosis Date Noted  . Sleep disturbance   . PAF (paroxysmal atrial fibrillation) ([place])   . Recurrent UTI   . Hypokalemia   . Hyponatremia   . Abnormal urine   . 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 Benign essential HTN   . Urinary retention   . History of gout   . Global aphasia   . SAH (subarachnoid hemorrhage) ([place])   . SDH (subdural hematoma) ([place])   . Lymphocytosis   . Subdural hemorrhage following injury ([place]) 05/12/2016    Alanson Puls, PT DPT 03/19/2018, 9:21 AM  [place] MAIN [facility] SERVICES [address] [place], [place], [address] Phone: [phone]   Fax:  [phone]  Name: James [name] MRN: [id number] Date of Birth: [birth year]

## 2018-03-21 ENCOUNTER — Encounter: Payer: Self-pay | Admitting: Physical Therapy

## 2018-03-21 ENCOUNTER — Ambulatory Visit: Payer: PPO | Admitting: Occupational Therapy

## 2018-03-21 ENCOUNTER — Ambulatory Visit: Payer: PPO | Admitting: Physical Therapy

## 2018-03-21 DIAGNOSIS — R414 Neurologic neglect syndrome: Secondary | ICD-10-CM

## 2018-03-21 DIAGNOSIS — M6281 Muscle weakness (generalized): Secondary | ICD-10-CM

## 2018-03-21 DIAGNOSIS — R262 Difficulty in walking, not elsewhere classified: Secondary | ICD-10-CM

## 2018-03-21 DIAGNOSIS — R278 Other lack of coordination: Secondary | ICD-10-CM

## 2018-03-21 DIAGNOSIS — R2681 Unsteadiness on feet: Secondary | ICD-10-CM

## 2018-03-21 NOTE — Therapy (Signed)
[place] MAIN [facility] SERVICES [address] [place], [place], [address] Phone: [phone]   Fax:  [phone]  Physical Therapy Treatment  Patient Details  Name: James [name] MRN: [id number] Date of Birth: [birth year] Referring Provider: shah   Encounter Date: 03/21/2018  PT End of Session - 03/21/18 1041    Visit Number  8    Number of Visits  25    Date for PT Re-Evaluation  05/21/18    Authorization Type  8/10 starting on 02/26/18    PT Start Time  1015    PT Stop Time  1100    PT Time Calculation (min)  45 min    Equipment Utilized During Treatment  Gait belt    Activity Tolerance  Patient tolerated treatment well    Behavior During Therapy  [facility] for tasks assessed/performed       Past Medical History:  Diagnosis Date  . Alcohol abuse, in remission   . Atrial fibrillation ([place])   . Bilateral renal masses   . Closed right ankle fracture   . 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 Gait disorder   . Gout   . Hypertension   . Seizure disorder ([place])   . 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     Past Surgical History:  Procedure Laterality Date  . CRANIECTOMY FOR DEPRESSED SKULL FRACTURE  2009   with MRSA infection  . CRANIOTOMY     X 2  . HERNIA REPAIR     during childhood  . ORIF ANKLE FRACTURE Left 2012    There were no vitals filed for this visit.  Subjective Assessment - 03/21/18 1041    Subjective  Pt reports that he is doing well today. No specific questions or concerns. Denies pain and no external signs of discomfort. Wife denies any health changes since last visit.    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  He suffered another fall in 2017.  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    Patient Stated Goals   Patient wants to have a safer gait and not stagger or feel off balance.     Currently in Pain?  No/denies    Multiple Pain Sites  No       NEUROMUSCULAR RE-EDUCATION  Eccentric step downs from 6 inch stool x 10 x 2 BLE  Fwd stepping to stepping stone from flat surface x 10 , CGA  Lateral stepping to stepping stones from flat surface x 10 , CGA   Toe tapping 6 inch stool without UE assist, min assist   Side stepping on blue foam balance beam x 5 lengths of the parallel bars   Matrix fwd/bwd, side to side with 22. 5 lbs and mod assist x 3 each way, needs min VCs to increase step length for better foot clearance; pt demonstrates increased posterior lean with stepping backwards requiring cues to improve forward weight shift for increased stance control;  Standing with hip abd x 10 x 2 with GTB   Standing with hip ext x 10 x 2 with GTB   Heel raises x 20    Patient needs VC and demonstration for correct performance of exercises.  PT Education - 03/21/18 1041    Education Details  HEP    Person(s) Educated  Patient    Methods  Explanation;Demonstration;Verbal cues    Comprehension  Verbalized understanding;Returned demonstration       PT Short Term Goals - 02/26/18 1128      PT SHORT TERM GOAL #1   Title  Patient will be independent in home exercise program to improve strength/mobility for better functional independence with ADLs.    Time  6    Period  Weeks    Status  New    Target Date  04/09/18      PT SHORT TERM GOAL #2   Title  Patient (> 76 years old) will complete five times sit to stand test in < 15 seconds indicating an increased LE strength and improved balance.    Baseline  19.15 sec and needs CGA to min assist to keep from falling    Time  6    Period  Weeks    Status  New    Target Date  04/09/18        PT Long Term Goals - 02/26/18 1129      PT LONG TERM GOAL #1   Title  Patient will increase Berg  Balance score by > 6 points to demonstrate decreased fall risk during functional activities.    Baseline  36/56 Berg    Time  12    Period  Weeks    Status  New    Target Date  05/21/18      PT LONG TERM GOAL #2   Title  Patient will reduce timed up and go to <11 seconds to reduce fall risk and demonstrate improved transfer/gait ability.    Baseline  15.18 sec with poor safety judgement and uncontrolled sit    Time  12    Period  Weeks    Status  New    Target Date  05/21/18      PT LONG TERM GOAL #3   Title  Patient will increase dynamic gait index score to >19/24 as to demonstrate reduced fall risk and improved dynamic gait balance for better safety with community/home ambulation.     Baseline  16/24    Time  12    Period  Weeks    Status  New    Target Date  05/21/18            Plan - 03/21/18 1101    Clinical Impression Statement  Patient instructed in advanced dynamic and static balance exercise. Utilized stable and uneven surfaces to further challenge dynamic balance. Patient required min Vcs for correct positioning and exercise technique! Patient had a difficult time during narrow base of support challenges. He exhibits better SLS ability being able to progress to foam; Patient would benefit from additional skilled PT Intervention to improve strength, balance and gait safety    Rehab Potential  Good    PT Frequency  2x / week    PT Duration  12 weeks    PT Treatment/Interventions  Therapeutic activities;Therapeutic exercise;Balance training;Neuromuscular re-education;Functional mobility training;Gait training;Manual techniques    PT Next Visit Plan  balance and HEP    PT Home Exercise Plan  heel raises, bridges    Consulted and Agree with Plan of Care  Patient;Family member/caregiver       Patient will benefit from skilled therapeutic intervention in order to improve the following deficits and impairments:  Abnormal gait, Decreased coordination, Decreased activity  tolerance,  Decreased endurance, Decreased strength, Decreased balance, Decreased safety awareness, Difficulty walking  Visit Diagnosis: Muscle weakness (generalized)  Other lack of coordination  Neurologic neglect syndrome  Difficulty in walking, not elsewhere classified  Unsteadiness on feet     Problem List Patient Active Problem List   Diagnosis Date Noted  . Sleep disturbance   . PAF (paroxysmal atrial fibrillation) ([place])   . Recurrent UTI   . Hypokalemia   . Hyponatremia   . Abnormal urine   . 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 Benign essential HTN   . Urinary retention   . History of gout   . Global aphasia   . SAH (subarachnoid hemorrhage) ([place])   . SDH (subdural hematoma) ([place])   . Lymphocytosis   . Subdural hemorrhage following injury ([place]) 05/12/2016    Alanson Puls, PT DPT 03/21/2018, 11:02 AM  [place] MAIN [facility] SERVICES [address] [place], [place], [address] Phone: [phone]   Fax:  [phone]  Name: James [name] MRN: [id number] Date of Birth: [birth year]

## 2018-03-24 ENCOUNTER — Encounter: Payer: Self-pay | Admitting: Occupational Therapy

## 2018-03-24 NOTE — Therapy (Signed)
[place] MAIN [facility] SERVICES [address] [place], [place], [address] Phone: [phone]   Fax:  [phone]  Occupational Therapy Treatment  Patient Details  Name: James [name] MRN: [id number] Date of Birth: [birth year] Referring Provider: shah   Encounter Date: 03/12/2018  OT End of Session - 03/24/18 1623    Visit Number  8    Number of Visits  60    Date for OT Re-Evaluation  03/15/18    Authorization Type  Medicare    OT Start Time  6962    OT Stop Time  1231    OT Time Calculation (min)  46 min    Activity Tolerance  Patient tolerated treatment well    Behavior During Therapy  [facility] for tasks assessed/performed       Past Medical History:  Diagnosis Date  . Alcohol abuse, in remission   . Atrial fibrillation ([place])   . Bilateral renal masses   . Closed right ankle fracture   . 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 Gait disorder   . Gout   . Hypertension   . Seizure disorder ([place])   . 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     Past Surgical History:  Procedure Laterality Date  . CRANIECTOMY FOR DEPRESSED SKULL FRACTURE  2009   with MRSA infection  . CRANIOTOMY     X 2  . HERNIA REPAIR     during childhood  . ORIF ANKLE FRACTURE Left 2012    There were no vitals filed for this visit.  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  He suffered another fall in 2017.  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     Patient Stated Goals  to be able to use the right hand functionally    Currently in Pain?  No/denies    Multiple Pain Sites  No          Patient seen for manipulation of small Magnets on easel board with cues for prehension patterns  for picking up, then use of  translatory skills of the hand to move to palm and using palm for storage.  Attempting to separate magnets in hand to place  one at a time.  Difficulty with using hand for storage and with prehension pattern with thumb and index tip to tip pattern.  Multiple  trials and repetitions performed with moderate verbal cues and therapist hand over hand assist, tactile cues                   OT Education - 03/24/18 1623    Education provided  Yes    Education Details  manipulation skills, graded pressure    Person(s) Educated  Patient;Spouse    Methods  Explanation;Demonstration;Verbal cues    Comprehension  Verbal cues required;Returned demonstration;Verbalized understanding          OT Long Term Goals - 01/17/18 0909      OT LONG TERM GOAL #1   Title  Patient will improve right hand function to hold utensil in right hand for self feeding with modified independence.     Baseline  unable to hold utensil at eval, now  able to hold utensils and self feed with minimal spillage.    Time  12    Period  Weeks    Status  Achieved      OT LONG TERM GOAL #2   Title  Pt will increase hand strength in right by 5# to be able to cut food with modified independence.    Baseline  unable at eval, able to cut meat with occasional assist at 30 visit.    Time  12    Period  Weeks    Status  Achieved      OT LONG TERM GOAL #3   Title  Patient will demonstrate increased awareness and strategies to attend to right side to hold items without dropping items.    Baseline  drops items frequently, 30 visit-patient dropping objects less frequently and is more aware when holding objects in hand    Time  12    Period  Weeks    Status  On-going      OT LONG TERM GOAL #4   Title  Patient will demonstrate independence in home exercise program for ROM, strength and coordination of right hand.    Baseline  no current program, modified independent with current program at 30  visit but continuing to add exercises.    Time  6    Period  Weeks    Status  On-going      OT LONG TERM GOAL #5   Title  Patient will demonstrate the ability to obtain items from right pocket 90% of the time.    Baseline  difficulty getting keys or coins out of right pocket, 30 visit update-able to demonstrate 75% of the time    Time  12    Period  Weeks    Status  On-going      OT LONG TERM GOAL #6   Title  Patient will demonstrate pouring a drink with the right hand with attention to right side and not knocking the cup over and spilling.      Baseline  patient often does not realize the cup is on his right side and knocks it over when attempting to get a drink    Time  12    Period  Weeks    Status  New            Plan - 03/24/18 1624    Clinical Impression Statement  Moderate difficulty with using the right hand for storage today with small magnets the size of bulletin board pins.  Patient required moderate cues and therapist guiding and assist with manipulation skills and attempts at using the hand for storage.  Patient appears frustrated at times and does best with short breaks.  Continue to work towards tasks to improve right hand function for necessary daily tasks.     Occupational Profile and client history currently impacting functional performance  repeated falls, TBI, expressive aphasia, limited motion in right hand, lack of coordination from previous CVA    Occupational performance deficits (Please refer to evaluation for details):  ADL's;IADL's;Leisure    Rehab Potential  Good    Current Impairments/barriers affecting progress:  expressive aphasia, falls, neglect    OT Frequency  2x / week    OT Duration  12 weeks    OT Treatment/Interventions  Self-care/ADL training;Visual/perceptual remediation/compensation;DME and/or AE instruction;Patient/family education;Moist Heat;Therapeutic exercise;Manual Therapy;Therapeutic activities;Neuromuscular education    Consulted and  Agree with Plan of Care  Patient       Patient  will benefit from skilled therapeutic intervention in order to improve the following deficits and impairments:  Impaired vision/preception, Decreased coordination, Decreased safety awareness, Impaired UE functional use, Decreased strength  Visit Diagnosis: Muscle weakness (generalized)  Other lack of coordination    Problem List Patient Active Problem List   Diagnosis Date Noted  . Sleep disturbance   . PAF (paroxysmal atrial fibrillation) ([place])   . Recurrent UTI   . Hypokalemia   . Hyponatremia   . Abnormal urine   . 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 Benign essential HTN   . Urinary retention   . History of gout   . Global aphasia   . SAH (subarachnoid hemorrhage) ([place])   . SDH (subdural hematoma) ([place])   . Lymphocytosis   . Subdural hemorrhage following injury [facility]) 05/12/2016   Amy T Lovett, OTR/L, CLT  Lovett,Amy 03/24/2018, 4:28 PM  [place] MAIN [facility] SERVICES [address] [place], [place], [address] Phone: [phone]   Fax:  [phone]  Name: James [name] MRN: [id number] Date of Birth: [birth year]

## 2018-03-24 NOTE — Therapy (Signed)
[place] MAIN [facility] SERVICES [address] [place], [place], [address] Phone: [phone]   Fax:  [phone]  Occupational Therapy Treatment  Patient Details  Name: James [name] MRN: [id number] Date of Birth: 02-Feb-1941 Referring Provider: shah   Encounter Date: 03/19/2018  OT End of Session - 03/24/18 1815    Visit Number  51    Number of Visits  62    Date for OT Re-Evaluation  06/08/18    Authorization Type  Medicare visit 1/10    OT Start Time  0830    OT Stop Time  0915    OT Time Calculation (min)  45 min    Activity Tolerance  Patient tolerated treatment well    Behavior During Therapy  [facility] for tasks assessed/performed       Past Medical History:  Diagnosis Date  . Alcohol abuse, in remission   . Atrial fibrillation ([place])   . Bilateral renal masses   . Closed right ankle fracture   . 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 Gait disorder   . Gout   . Hypertension   . Seizure disorder ([place])   . 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     Past Surgical History:  Procedure Laterality Date  . CRANIECTOMY FOR DEPRESSED SKULL FRACTURE  2009   with MRSA infection  . CRANIOTOMY     X 2  . HERNIA REPAIR     during childhood  . ORIF ANKLE FRACTURE Left 2012    There were no vitals filed for this visit.  Subjective Assessment - 03/24/18 1814    Subjective   Patient denies any pain, indicates he has been doing exercises at home.     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  He suffered another fall in 2017.  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     Patient Stated Goals  to be able to use the right hand functionally    Currently in Pain?  No/denies    Multiple Pain Sites  No       Patient seen for manipulation of  [employer]ue pieces, collars/washers/dowels,  unable to pick up small collars with right, dowels  and washers with increased effort.  Use of magnetic bowl to place objects in for retrieval to increase resistance to fingers.  Patient occasionally requires therapist to hand him the object when he is struggling to manipulate piece to pick up.  Patient performing Plumber assembly with pvc pieces and use of diagram for visual spatial skills and design copy, 1-2 cues  for design and moderate cues to use both hands when attempting to secure pieces.                     OT Education - 03/24/18 1815    Education provided  Yes    Education Details  fine motor coordination skills, functional hand tasks    Person(s) Educated  Patient;Spouse    Methods  Explanation;Demonstration;Verbal cues    Comprehension  Verbal cues required;Returned demonstration;Verbalized understanding          OT Long Term Goals - 03/24/18 1633      OT LONG TERM GOAL #1   Title  Patient will improve right hand function to hold  utensil in right hand for self feeding with modified independence.     Baseline  unable to hold utensil at eval, now able to hold utensils and self feed with minimal spillage.    Time  12    Period  Weeks    Status  Achieved      OT LONG TERM GOAL #2   Title  Pt will increase hand strength in right by 5# to be able to cut food with modified independence.    Baseline  unable at eval, able to cut meat with occasional assist at 30 visit.    Time  12    Period  Weeks    Status  Achieved      OT LONG TERM GOAL #3   Title  Patient will demonstrate increased awareness and strategies to attend to right side to hold items without dropping items.    Baseline  drops items frequently, 30 visit-patient dropping objects less frequently and is more aware when holding objects in hand    Time  12    Period  Weeks    Status  On-going      OT LONG TERM GOAL #4   Title  Patient will demonstrate  independence in home exercise program for ROM, strength and coordination of right hand.    Baseline  no current program, modified independent with current program at 30 visit but continuing to add exercises.    Time  6    Period  Weeks    Status  On-going      OT LONG TERM GOAL #5   Title  Patient will demonstrate the ability to obtain items from right pocket 90% of the time.    Baseline  difficulty getting keys or coins out of right pocket, 30 visit update-able to demonstrate 75% of the time    Time  12    Period  Weeks    Status  On-going      OT LONG TERM GOAL #6   Title  Patient will demonstrate pouring a drink with the right hand with attention to right side and not knocking the cup over and spilling.      Baseline  patient often does not realize the cup is on his right side and knocks it over when attempting to get a drink    Time  12    Period  Weeks    Status  On-going            Plan - 03/24/18 1816    Clinical Impression Statement  Patient demonstrates difficulty with manipulation of small objects less than 1/2 inch in size, hard to pick up from flat surface but when handed, patient was able to demonstrate prehension patterns with cues to take object from therapist. Patient does well with design copy but required cues to secure pieces with 2 hands.  Continue to work towards goals to increase right hand use for daily tasks.     Occupational Profile and client history currently impacting functional performance  repeated falls, TBI, expressive aphasia, limited motion in right hand, lack of coordination from previous CVA    Occupational performance deficits (Please refer to evaluation for details):  ADL's;IADL's;Leisure    Rehab Potential  Good    Current Impairments/barriers affecting progress:  expressive aphasia, falls, neglect    OT Frequency  2x / week    OT Duration  12 weeks    OT Treatment/Interventions  Self-care/ADL training;Visual/perceptual  remediation/compensation;DME and/or AE instruction;Patient/family education;Moist Heat;Therapeutic  exercise;Manual Therapy;Therapeutic activities;Neuromuscular education    Consulted and Agree with Plan of Care  Patient       Patient will benefit from skilled therapeutic intervention in order to improve the following deficits and impairments:  Impaired vision/preception, Decreased coordination, Decreased safety awareness, Impaired UE functional use, Decreased strength  Visit Diagnosis: Muscle weakness (generalized)  Other lack of coordination  Neurologic neglect syndrome    Problem List Patient Active Problem List   Diagnosis Date Noted  . Sleep disturbance   . PAF (paroxysmal atrial fibrillation) ([place])   . Recurrent UTI   . Hypokalemia   . Hyponatremia   . Abnormal urine   . 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 Benign essential HTN   . Urinary retention   . History of gout   . Global aphasia   . SAH (subarachnoid hemorrhage) ([place])   . SDH (subdural hematoma) ([place])   . Lymphocytosis   . Subdural hemorrhage following injury [facility]) 05/12/2016   Amy T Lovett, OTR/L, CLT  Lovett,Amy 03/24/2018, 6:23 PM  [place] MAIN [facility] SERVICES [address] [place], [place], [address] Phone: [phone]   Fax:  [phone]  Name: Edrian Melucci MRN: [id number] Date of Birth: 12/03/1940

## 2018-03-24 NOTE — Therapy (Signed)
[place] MAIN [facility] SERVICES [address] [place], [place], [address] Phone: [phone]   Fax:  [phone]  Occupational Therapy Treatment/Progress Note/Reassessment Reporting period from 01/16/2018 to 03/14/2018  Patient Details  Name: James [name] MRN: [id number] Date of Birth: September 20, 1940 Referring Provider: shah   Encounter Date: 03/14/2018  OT End of Session - 03/24/18 1631    Visit Number  50    Number of Visits  43    Date for OT Re-Evaluation  06/08/18    Authorization Type  Medicare visit 10/10    OT Start Time  0925    OT Stop Time  1015    OT Time Calculation (min)  50 min    Activity Tolerance  Patient tolerated treatment well    Behavior During Therapy  [facility] for tasks assessed/performed       Past Medical History:  Diagnosis Date  . Alcohol abuse, in remission   . Atrial fibrillation ([place])   . Bilateral renal masses   . Closed right ankle fracture   . 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 Gait disorder   . Gout   . Hypertension   . Seizure disorder ([place])   . 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     Past Surgical History:  Procedure Laterality Date  . CRANIECTOMY FOR DEPRESSED SKULL FRACTURE  2009   with MRSA infection  . CRANIOTOMY     X 2  . HERNIA REPAIR     during childhood  . ORIF ANKLE FRACTURE Left 2012    There were no vitals filed for this visit.  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  He suffered another fall in 2017.  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     Patient Stated Goals  to be able to use the right hand functionally    Currently in Pain?  No/denies    Multiple Pain Sites  No            Patient seen for use of scissors in right hand for Cutting paper, unable to use regular scissors, min assist and cues with loop scissors,  required lines drawn in to follow with cutting.  Patient instructed to cut across page and make small square notes.   Patient Writing on  squares with assist for words and then worked to [employer] onto [employer] with magnets with the use of both hands, cues  for performing task with bilateral hand use.   Reassessment of goals, POC and updated to reflect progress.                 OT Education - 03/24/18 4270    Education provided  Yes    Education Details  fine motor coordination skills, functional hand tasks    Person(s) Educated  Patient;Spouse    Methods  Explanation;Demonstration;Verbal cues    Comprehension  Verbal cues required;Returned demonstration;Verbalized understanding          OT Long Term Goals - 03/24/18 1633      OT LONG TERM GOAL #1   Title  Patient will improve right hand function to hold utensil in right hand for  self feeding with modified independence.     Baseline  unable to hold utensil at eval, now able to hold utensils and self feed with minimal spillage.    Time  12    Period  Weeks    Status  Achieved      OT LONG TERM GOAL #2   Title  Pt will increase hand strength in right by 5# to be able to cut food with modified independence.    Baseline  unable at eval, able to cut meat with occasional assist at 30 visit.    Time  12    Period  Weeks    Status  Achieved      OT LONG TERM GOAL #3   Title  Patient will demonstrate increased awareness and strategies to attend to right side to hold items without dropping items.    Baseline  drops items frequently, 30 visit-patient dropping objects less frequently and is more aware when holding objects in hand    Time  12    Period  Weeks    Status  On-going      OT LONG TERM GOAL #4   Title  Patient will demonstrate independence in home  exercise program for ROM, strength and coordination of right hand.    Baseline  no current program, modified independent with current program at 30 visit but continuing to add exercises.    Time  6    Period  Weeks    Status  On-going      OT LONG TERM GOAL #5   Title  Patient will demonstrate the ability to obtain items from right pocket 90% of the time.    Baseline  difficulty getting keys or coins out of right pocket, 30 visit update-able to demonstrate 75% of the time    Time  12    Period  Weeks    Status  On-going      OT LONG TERM GOAL #6   Title  Patient will demonstrate pouring a drink with the right hand with attention to right side and not knocking the cup over and spilling.      Baseline  patient often does not realize the cup is on his right side and knocks it over when attempting to get a drink    Time  12    Period  Weeks    Status  On-going            Plan - 03/24/18 1632    Clinical Impression Statement  Patient has continued to make good progress, unable this date to use regular scissors for cutting paper but could use an adapted loop scissor to perform cutting with min assist and cues.  He continues to demonstrate neglect on the right side but demonstrates increased awareness and compensatory techniques in the clinic and in daily tasks.  Patient has continued to make progress with his right hand and would benefit from skilled OT to maximize safety and independence in daily tasks.  Goals updated to reflect progress.      Occupational Profile and client history currently impacting functional performance  repeated falls, TBI, expressive aphasia, limited motion in right hand, lack of coordination from previous CVA    Occupational performance deficits (Please refer to evaluation for details):  ADL's;IADL's;Leisure    Rehab Potential  Good    Current Impairments/barriers affecting progress:  expressive aphasia, falls, neglect    OT Frequency  2x / week    OT Duration  12 weeks    OT Treatment/Interventions  Self-care/ADL training;Visual/perceptual remediation/compensation;DME and/or AE instruction;Patient/family education;Moist Heat;Therapeutic exercise;Manual Therapy;Therapeutic activities;Neuromuscular education    Consulted and Agree with Plan of Care  Patient       Patient will benefit from skilled therapeutic intervention in order to improve the following deficits and impairments:  Impaired vision/preception, Decreased coordination, Decreased safety awareness, Impaired UE functional use, Decreased strength  Visit Diagnosis: Muscle weakness (generalized)  Other lack of coordination  Neurologic neglect syndrome    Problem List Patient Active Problem List   Diagnosis Date Noted  . Sleep disturbance   . PAF (paroxysmal atrial fibrillation) ([place])   . Recurrent UTI   . Hypokalemia   . Hyponatremia   . Abnormal urine   . 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 Benign essential HTN   . Urinary retention   . History of gout   . Global aphasia   . SAH (subarachnoid hemorrhage) ([place])   . SDH (subdural hematoma) ([place])   . Lymphocytosis   . Subdural hemorrhage following injury [facility]) 05/12/2016   Amy T Lovett, OTR/L, CLT  Lovett,Amy 03/24/2018, 4:48 PM  [place] MAIN [facility] SERVICES [address] [place], [place], [address] Phone: [phone]   Fax:  [phone]  Name: Scott Vanderveer MRN: [id number] Date of Birth: 03-01-1941

## 2018-03-24 NOTE — Therapy (Signed)
[place] MAIN [facility] SERVICES [address] [place], [place], [address] Phone: [phone]   Fax:  [phone]  Occupational Therapy Treatment  Patient Details  Name: James [name] MRN: [id number] Date of Birth: [birth year] Referring Provider: shah   Encounter Date: 03/21/2018  OT End of Session - 03/24/18 1827    Visit Number  52    Number of Visits  44    Date for OT Re-Evaluation  06/08/18    Authorization Type  Medicare visit 2/10    OT Start Time  0930    OT Stop Time  1016    OT Time Calculation (min)  46 min    Activity Tolerance  Patient tolerated treatment well    Behavior During Therapy  [facility] for tasks assessed/performed       Past Medical History:  Diagnosis Date  . Alcohol abuse, in remission   . Atrial fibrillation ([place])   . Bilateral renal masses   . Closed right ankle fracture   . 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 Gait disorder   . Gout   . Hypertension   . Seizure disorder ([place])   . 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     Past Surgical History:  Procedure Laterality Date  . CRANIECTOMY FOR DEPRESSED SKULL FRACTURE  2009   with MRSA infection  . CRANIOTOMY     X 2  . HERNIA REPAIR     during childhood  . ORIF ANKLE FRACTURE Left 2012    There were no vitals filed for this visit.  Subjective Assessment - 03/24/18 1826    Subjective   Patient indicating frustration today with specific tasks with object manipulation.     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  He suffered another fall in 2017.  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     Patient Stated Goals  to be able to use the right hand functionally    Currently in Pain?  No/denies    Multiple Pain Sites  No          Patient seen for  manipulation of 100/Judy pegboard with difficulty if pegs are in board, once pegs are placed outside  of the board, patient can pick up and attempt to turn.  Moderate cues to pick up with middle finger and thumb and then  use index finger to turn object.   Manipulation of cards to sort into 2 piles, red and black with cues for thumb, finger combinations to turn and flip opposite  directions.  Focused on speed and dexterity of task.                    OT Education - 03/24/18 1826    Education provided  Yes    Education Details  thumb finger combinations with cards    Person(s) Educated  Patient    Methods  Explanation;Demonstration;Verbal cues    Comprehension  Verbal cues required;Returned demonstration;Verbalized understanding          OT Long Term Goals - 03/24/18 1633      OT LONG TERM GOAL #1   Title  Patient will improve right hand function to hold utensil in right hand for self feeding with modified independence.  Baseline  unable to hold utensil at eval, now able to hold utensils and self feed with minimal spillage.    Time  12    Period  Weeks    Status  Achieved      OT LONG TERM GOAL #2   Title  Pt will increase hand strength in right by 5# to be able to cut food with modified independence.    Baseline  unable at eval, able to cut meat with occasional assist at 30 visit.    Time  12    Period  Weeks    Status  Achieved      OT LONG TERM GOAL #3   Title  Patient will demonstrate increased awareness and strategies to attend to right side to hold items without dropping items.    Baseline  drops items frequently, 30 visit-patient dropping objects less frequently and is more aware when holding objects in hand    Time  12    Period  Weeks    Status  On-going      OT LONG TERM GOAL #4   Title  Patient will demonstrate independence in home exercise program for ROM, strength and coordination of right hand.    Baseline  no current program, modified  independent with current program at 30 visit but continuing to add exercises.    Time  6    Period  Weeks    Status  On-going      OT LONG TERM GOAL #5   Title  Patient will demonstrate the ability to obtain items from right pocket 90% of the time.    Baseline  difficulty getting keys or coins out of right pocket, 30 visit update-able to demonstrate 75% of the time    Time  12    Period  Weeks    Status  On-going      OT LONG TERM GOAL #6   Title  Patient will demonstrate pouring a drink with the right hand with attention to right side and not knocking the cup over and spilling.      Baseline  patient often does not realize the cup is on his right side and knocks it over when attempting to get a drink    Time  12    Period  Weeks    Status  On-going            Plan - 03/24/18 1830    Clinical Impression Statement  Patient continues to work towards modulating graded pressure of the right hand, fine motor coordination skills, speed and dexterity.  Continues to demonstrate improvements but also difficulty with manipulation of small objects.  Continue to work towards goals in plan of care.     Occupational Profile and client history currently impacting functional performance  repeated falls, TBI, expressive aphasia, limited motion in right hand, lack of coordination from previous CVA    Occupational performance deficits (Please refer to evaluation for details):  ADL's;IADL's;Leisure    Rehab Potential  Good    Current Impairments/barriers affecting progress:  expressive aphasia, falls, neglect    OT Frequency  2x / week    OT Duration  12 weeks    OT Treatment/Interventions  Self-care/ADL training;Visual/perceptual remediation/compensation;DME and/or AE instruction;Patient/family education;Moist Heat;Therapeutic exercise;Manual Therapy;Therapeutic activities;Neuromuscular education    Consulted and Agree with Plan of Care  Patient       Patient will benefit from skilled therapeutic  intervention in order to improve the following deficits and impairments:  Impaired vision/preception, Decreased coordination, Decreased safety awareness, Impaired UE functional use, Decreased strength  Visit Diagnosis: Muscle weakness (generalized)  Other lack of coordination  Neurologic neglect syndrome    Problem List Patient Active Problem List   Diagnosis Date Noted  . Sleep disturbance   . PAF (paroxysmal atrial fibrillation) ([place])   . Recurrent UTI   . Hypokalemia   . Hyponatremia   . Abnormal urine   . 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 Benign essential HTN   . Urinary retention   . History of gout   . Global aphasia   . SAH (subarachnoid hemorrhage) ([place])   . SDH (subdural hematoma) ([place])   . Lymphocytosis   . Subdural hemorrhage following injury [facility]) 05/12/2016   James [name], OTR/L, CLT  James [name] 03/24/2018, 6:33 PM  [place] MAIN [facility] SERVICES [address] [place], [place], [address] Phone: [phone]   Fax:  [phone]  Name: James [name] MRN: [id number] Date of Birth: 12/07/1940

## 2018-03-27 ENCOUNTER — Ambulatory Visit: Payer: PPO | Attending: Neurology | Admitting: Physical Therapy

## 2018-03-27 ENCOUNTER — Encounter: Payer: Self-pay | Admitting: Occupational Therapy

## 2018-03-27 ENCOUNTER — Encounter: Payer: Self-pay | Admitting: Physical Therapy

## 2018-03-27 ENCOUNTER — Ambulatory Visit: Payer: PPO | Admitting: Occupational Therapy

## 2018-03-27 DIAGNOSIS — I609 Nontraumatic subarachnoid hemorrhage, unspecified: Secondary | ICD-10-CM | POA: Diagnosis not present

## 2018-03-27 DIAGNOSIS — R414 Neurologic neglect syndrome: Secondary | ICD-10-CM | POA: Diagnosis not present

## 2018-03-27 DIAGNOSIS — S065X9A Traumatic subdural hemorrhage with loss of consciousness of unspecified duration, initial encounter: Secondary | ICD-10-CM | POA: Insufficient documentation

## 2018-03-27 DIAGNOSIS — R278 Other lack of coordination: Secondary | ICD-10-CM

## 2018-03-27 DIAGNOSIS — M6281 Muscle weakness (generalized): Secondary | ICD-10-CM | POA: Diagnosis not present

## 2018-03-27 DIAGNOSIS — R2681 Unsteadiness on feet: Secondary | ICD-10-CM | POA: Insufficient documentation

## 2018-03-27 DIAGNOSIS — R262 Difficulty in walking, not elsewhere classified: Secondary | ICD-10-CM | POA: Insufficient documentation

## 2018-03-27 DIAGNOSIS — R4701 Aphasia: Secondary | ICD-10-CM | POA: Diagnosis not present

## 2018-03-27 NOTE — Therapy (Signed)
[place] MAIN [facility] SERVICES [address] [place], [place], [address] Phone: [phone]   Fax:  [phone]  Occupational Therapy Treatment  Patient Details  Name: James [name] MRN: [id number] Date of Birth: [birth year] Referring Provider: shah   Encounter Date: 03/27/2018  OT End of Session - 03/30/18 1011    Visit Number  69    Number of Visits  27    Date for OT Re-Evaluation  06/08/18    Authorization Type  Medicare visit 3/10    OT Start Time  0846    OT Stop Time  0930    OT Time Calculation (min)  44 min    Activity Tolerance  Patient tolerated treatment well    Behavior During Therapy  [facility] for tasks assessed/performed       Past Medical History:  Diagnosis Date  . Alcohol abuse, in remission   . Atrial fibrillation ([place])   . Bilateral renal masses   . Closed right ankle fracture   . 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 Gait disorder   . Gout   . Hypertension   . Seizure disorder ([place])   . 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     Past Surgical History:  Procedure Laterality Date  . CRANIECTOMY FOR DEPRESSED SKULL FRACTURE  2009   with MRSA infection  . CRANIOTOMY     X 2  . HERNIA REPAIR     during childhood  . ORIF ANKLE FRACTURE Left 2012    There were no vitals filed for this visit.  Subjective Assessment - 03/30/18 1011    Subjective   Patient acknowledging he had a good weekend and nice Labor Day, No complaints of pain.      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  He suffered another fall in 2017.  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     Patient Stated Goals  to be able to use the right hand functionally    Currently in Pain?  No/denies    Multiple Pain Sites  No        Patient seen for  unknotting exercise with use of bilateral UEs with cues for prehension patterns on the right with knots Which are tighter this date than in the past. Patient tends to want to to use his mouth/teeth to assist and required cues to not do this.  Patient seen for attempts to pick up small dowel sticks, unable to pick up from flat surface, able to pick up from vertical position if dowels are placed upright into resistive putty.    Manipulation of [place] discs with cues to pick up between thumb and middle finger and to use index finger to turn object, emphasis on quality of movement. Difficulty noted with dexterity and isolated finger movements.                  OT Education - 03/30/18 1011    Education provided  Yes    Education Details  prehension patterns on right, HEP    Person(s) Educated  Patient    Methods  Explanation;Demonstration;Verbal cues    Comprehension  Verbal cues required;Returned demonstration;Verbalized understanding          OT Long Term Goals - 03/24/18 1633  OT LONG TERM GOAL #1   Title  Patient will improve right hand function to hold utensil in right hand for self feeding with modified independence.     Baseline  unable to hold utensil at eval, now able to hold utensils and self feed with minimal spillage.    Time  12    Period  Weeks    Status  Achieved      OT LONG TERM GOAL #2   Title  Pt will increase hand strength in right by 5# to be able to cut food with modified independence.    Baseline  unable at eval, able to cut meat with occasional assist at 30 visit.    Time  12    Period  Weeks    Status  Achieved      OT LONG TERM GOAL #3   Title  Patient will demonstrate increased awareness and strategies to attend to right side to hold items without dropping items.    Baseline  drops items frequently, 30 visit-patient dropping objects less frequently and is more aware when holding objects in hand    Time  12    Period  Weeks    Status   On-going      OT LONG TERM GOAL #4   Title  Patient will demonstrate independence in home exercise program for ROM, strength and coordination of right hand.    Baseline  no current program, modified independent with current program at 30 visit but continuing to add exercises.    Time  6    Period  Weeks    Status  On-going      OT LONG TERM GOAL #5   Title  Patient will demonstrate the ability to obtain items from right pocket 90% of the time.    Baseline  difficulty getting keys or coins out of right pocket, 30 visit update-able to demonstrate 75% of the time    Time  12    Period  Weeks    Status  On-going      OT LONG TERM GOAL #6   Title  Patient will demonstrate pouring a drink with the right hand with attention to right side and not knocking the cup over and spilling.      Baseline  patient often does not realize the cup is on his right side and knocks it over when attempting to get a drink    Time  12    Period  Weeks    Status  On-going            Plan - 03/30/18 1012    Clinical Impression Statement  Patient demonstrates difficulty with picking up small dowel sticks from tabletop, flat surface. when items are placed in a vertical position, he is able to pick up but drops items frequently.  He also has difficulty with reinforcing thumb and middle finger pick up patterns while using index finger to isolate and turn object.  Continue to work on prehension patterns, quality of movements and speed/dexterity.    Occupational Profile and client history currently impacting functional performance  repeated falls, TBI, expressive aphasia, limited motion in right hand, lack of coordination from previous CVA    Occupational performance deficits (Please refer to evaluation for details):  ADL's;IADL's;Leisure    Rehab Potential  Good    Current Impairments/barriers affecting progress:  expressive aphasia, falls, neglect    OT Frequency  2x / week    OT Duration  12 weeks  OT  Treatment/Interventions  Self-care/ADL training;Visual/perceptual remediation/compensation;DME and/or AE instruction;Patient/family education;Moist Heat;Therapeutic exercise;Manual Therapy;Therapeutic activities;Neuromuscular education    Consulted and Agree with Plan of Care  Patient       Patient will benefit from skilled therapeutic intervention in order to improve the following deficits and impairments:  Impaired vision/preception, Decreased coordination, Decreased safety awareness, Impaired UE functional use, Decreased strength  Visit Diagnosis: Muscle weakness (generalized)  Other lack of coordination  Neurologic neglect syndrome    Problem List Patient Active Problem List   Diagnosis Date Noted  . Sleep disturbance   . PAF (paroxysmal atrial fibrillation) ([place])   . Recurrent UTI   . Hypokalemia   . Hyponatremia   . Abnormal urine   . 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 Benign essential HTN   . Urinary retention   . History of gout   . Global aphasia   . SAH (subarachnoid hemorrhage) ([place])   . SDH (subdural hematoma) ([place])   . Lymphocytosis   . Subdural hemorrhage following injury ([place]) 05/12/2016   Abbey Veith T Thoma Paulsen, OTR/L, CLT  Victorya Hillman 03/30/2018, 10:14 AM  [place] MAIN [facility] SERVICES [address] [place], [place], [address] Phone: [phone]   Fax:  [phone]  Name: James [name] MRN: [id number] Date of Birth: 1941-03-28

## 2018-03-27 NOTE — Therapy (Signed)
[place] MAIN [facility] SERVICES [address] [place], [place], [address] Phone: [phone]   Fax:  [phone]  Physical Therapy Treatment  Patient Details  Name: James [name] MRN: [id number] Date of Birth: April 23, 1941 Referring Provider: shah   Encounter Date: 03/27/2018  PT End of Session - 03/27/18 0812    Visit Number  9    Number of Visits  25    Date for PT Re-Evaluation  05/21/18    Authorization Type  9/10 starting on 02/26/18    PT Start Time  0805    PT Stop Time  0845    PT Time Calculation (min)  40 min    Equipment Utilized During Treatment  Gait belt    Activity Tolerance  Patient tolerated treatment well    Behavior During Therapy  [facility] for tasks assessed/performed       Past Medical History:  Diagnosis Date  . Alcohol abuse, in remission   . Atrial fibrillation ([place])   . Bilateral renal masses   . Closed right ankle fracture   . 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 Gait disorder   . Gout   . Hypertension   . Seizure disorder ([place])   . 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     Past Surgical History:  Procedure Laterality Date  . CRANIECTOMY FOR DEPRESSED SKULL FRACTURE  2009   with MRSA infection  . CRANIOTOMY     X 2  . HERNIA REPAIR     during childhood  . ORIF ANKLE FRACTURE Left 2012    There were no vitals filed for this visit.  Subjective Assessment - 03/27/18 0811    Subjective  Pt reports that he is doing well today. No specific questions or concerns. Denies pain and no external signs of discomfort. Wife denies any health changes since last visit.    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  He suffered another fall in 2017.  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    Patient Stated Goals   Patient wants to have a safer gait and not stagger or feel off balance.     Currently in Pain?  No/denies    Multiple Pain Sites  No       NEUROMUSCULAR RE-EDUCATION  Eccentric step downs from 6 inch stool x 10 x 2 BLE, CGA and UE assist  Fwd stepping to stepping stone from flat surface x 10 ,  CGA and UE assist  Lateral stepping to stepping stones from flat surface x 10 ,  CGA and UE assist   Toe tapping 6 inch stool without UE assist, min assist   Side stepping on blue foam balance beam x 5 lengths of the parallel bars   Matrix fwd/bwd, side to side with 22. 5 lbs and mod assist x 3 each way, needs min VCs to increase step length for better foot clearance; pt demonstrates increased posterior lean with stepping backwards requiring cues to improve forward weight shift for increased stance control;   Gait out in hallway, CGA for all activities with VCs for maintaining gait speed and step length with activities: -Horizontal head turns x160 ft, calling out cards as walking to give point of focus -Vertical head turns x160 ft -Direction changes  x80 ft, some difficulty with changing directions quickly and beginning to walk backwards -Speed changes x160 ft, some difficulty with slower gait speed and maintaining balance but good increase in gait speed    CGA and Min to mod verbal cues used throughout with increased in postural sway and LOB most seen with narrow base of support and while on uneven surfaces. Continues to have balance deficits typical with diagnosis. Patient performs advanced level exercises without pain behaviors and needs verbal cuing for postural alignment and head positioning                   PT Education - 03/27/18 0811    Education Details  saftey, slowing down, HEP    Person(s) Educated  Patient    Methods  Explanation;Demonstration;Tactile cues;Verbal cues    Comprehension  Returned demonstration;Need further instruction       PT Short Term  Goals - 02/26/18 1128      PT SHORT TERM GOAL #1   Title  Patient will be independent in home exercise program to improve strength/mobility for better functional independence with ADLs.    Time  6    Period  Weeks    Status  New    Target Date  04/09/18      PT SHORT TERM GOAL #2   Title  Patient (> 37 years old) will complete five times sit to stand test in < 15 seconds indicating an increased LE strength and improved balance.    Baseline  19.15 sec and needs CGA to min assist to keep from falling    Time  6    Period  Weeks    Status  New    Target Date  04/09/18        PT Long Term Goals - 02/26/18 1129      PT LONG TERM GOAL #1   Title  Patient will increase Berg Balance score by > 6 points to demonstrate decreased fall risk during functional activities.    Baseline  36/56 Berg    Time  12    Period  Weeks    Status  New    Target Date  05/21/18      PT LONG TERM GOAL #2   Title  Patient will reduce timed up and go to <11 seconds to reduce fall risk and demonstrate improved transfer/gait ability.    Baseline  15.18 sec with poor safety judgement and uncontrolled sit    Time  12    Period  Weeks    Status  New    Target Date  05/21/18      PT LONG TERM GOAL #3   Title  Patient will increase dynamic gait index score to >19/24 as to demonstrate reduced fall risk and improved dynamic gait balance for better safety with community/home ambulation.     Baseline  16/24    Time  12    Period  Weeks    Status  New    Target Date  05/21/18            Plan - 03/27/18 0813    Clinical Impression Statement  Patient tolerated therapy session well. Pt performed balance activities with compliant surfaces as well as working on forwards/backwards stepping over object; required CGA-min A for minor LOB with VCs for proper technique and sequencing. Pt performed LE strengthening in standing and seated position with 2# ankle weights with decreased rest breaks; required  CGA-supervision for safety and VCs for  proper technique and form. Pt will continue to benefit from skilled PT intervention for improvements in balance, strength, and gait safety.    Rehab Potential  Good    PT Frequency  2x / week    PT Duration  12 weeks    PT Treatment/Interventions  Therapeutic activities;Therapeutic exercise;Balance training;Neuromuscular re-education;Functional mobility training;Gait training;Manual techniques    PT Next Visit Plan  balance and HEP    PT Home Exercise Plan  heel raises, bridges    Consulted and Agree with Plan of Care  Patient;Family member/caregiver       Patient will benefit from skilled therapeutic intervention in order to improve the following deficits and impairments:  Abnormal gait, Decreased coordination, Decreased activity tolerance, Decreased endurance, Decreased strength, Decreased balance, Decreased safety awareness, Difficulty walking  Visit Diagnosis: Muscle weakness (generalized)  Other lack of coordination  Neurologic neglect syndrome  Difficulty in walking, not elsewhere classified  Unsteadiness on feet     Problem List Patient Active Problem List   Diagnosis Date Noted  . Sleep disturbance   . PAF (paroxysmal atrial fibrillation) ([place])   . Recurrent UTI   . Hypokalemia   . Hyponatremia   . Abnormal urine   . 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 Benign essential HTN   . Urinary retention   . History of gout   . Global aphasia   . SAH (subarachnoid hemorrhage) ([place])   . SDH (subdural hematoma) ([place])   . Lymphocytosis   . Subdural hemorrhage following injury ([place]) 05/12/2016    Alanson Puls, PT DPT 03/27/2018, 8:25 AM  [place] MAIN [facility] SERVICES [address] [place], [place], [address] Phone: [phone]   Fax:  [phone]  Name: Gaspar Fowle MRN: [id number] Date of Birth: 1940/11/23

## 2018-03-29 ENCOUNTER — Ambulatory Visit: Payer: PPO | Admitting: Occupational Therapy

## 2018-03-29 ENCOUNTER — Encounter: Payer: Self-pay | Admitting: Physical Therapy

## 2018-03-29 ENCOUNTER — Ambulatory Visit: Payer: PPO | Admitting: Physical Therapy

## 2018-03-29 DIAGNOSIS — R262 Difficulty in walking, not elsewhere classified: Secondary | ICD-10-CM

## 2018-03-29 DIAGNOSIS — R414 Neurologic neglect syndrome: Secondary | ICD-10-CM

## 2018-03-29 DIAGNOSIS — M6281 Muscle weakness (generalized): Secondary | ICD-10-CM

## 2018-03-29 DIAGNOSIS — R278 Other lack of coordination: Secondary | ICD-10-CM

## 2018-03-29 NOTE — Therapy (Signed)
[place] MAIN [facility] SERVICES [address] [place], [place], [address] Phone: [phone]   Fax:  [phone]  Physical Therapy Treatment/Physical Therapy Progress Note   Dates of reporting period  02/26/18  to  03/29/18 Patient Details  Name: James [name] MRN: [id number] Date of Birth: Jun 02, 1941 Referring Provider: shah   Encounter Date: 03/29/2018  PT End of Session - 03/29/18 0819    Visit Number  10    Number of Visits  25    Date for PT Re-Evaluation  05/21/18    Authorization Type  10/10 starting on 02/26/18    PT Start Time  0800    PT Stop Time  0840    PT Time Calculation (min)  40 min    Equipment Utilized During Treatment  Gait belt    Activity Tolerance  Patient tolerated treatment well    Behavior During Therapy  [facility] for tasks assessed/performed       Past Medical History:  Diagnosis Date  . Alcohol abuse, in remission   . Atrial fibrillation ([place])   . Bilateral renal masses   . Closed right ankle fracture   . 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 Gait disorder   . Gout   . Hypertension   . Seizure disorder ([place])   . 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     Past Surgical History:  Procedure Laterality Date  . CRANIECTOMY FOR DEPRESSED SKULL FRACTURE  2009   with MRSA infection  . CRANIOTOMY     X 2  . HERNIA REPAIR     during childhood  . ORIF ANKLE FRACTURE Left 2012    There were no vitals filed for this visit.  Subjective Assessment - 03/29/18 0818    Subjective  Pt reports that he is doing well today. No specific questions or concerns. Denies pain and no external signs of discomfort. Wife denies any health changes since last visit.    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  He suffered another fall in 2017.   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    Patient Stated Goals  Patient wants to have a safer gait and not stagger or feel off balance.     Currently in Pain?  No/denies      Treatment: Therapeutic activities:  DGI performed, Berg balance test performed , 5 x sit to stand, TUG  Neuromuscular training:  Side stepping over bolster x 10, mod assist with 75% loss of balance or stepping on 1/2 bolster  Fwd/bwd stepping over bolster x 10, mod assist with loss of balance and uncontrolled RLE foot placement   Resisted walking 17.5# forward/backward, side stepping x4 way, x2 laps each direction, required CGA for safety; Required min VCs to increase step length for better hip strengthening/stabilization; Cues for correct technique of exercises and slow eccentric control to target specific muscles.                       PT Education - 03/29/18 0818    Education Details  balance, safety, HEP    Person(s) Educated  Patient    Methods  Explanation;Demonstration;Tactile cues    Comprehension  Verbalized understanding;Returned demonstration       PT Short Term  Goals - 03/29/18 0820      PT SHORT TERM GOAL #1   Title  Patient will be independent in home exercise program to improve strength/mobility for better functional independence with ADLs.    Time  6    Period  Weeks    Status  Partially Met    Target Date  04/09/18      PT SHORT TERM GOAL #2   Title  Patient (> 15 years old) will complete five times sit to stand test in < 15 seconds indicating an increased LE strength and improved balance.    Baseline  19.15 sec and needs CGA to min assist to keep from falling, 03/29/18=16.82 sec    Time  6    Period  Weeks    Status  Partially Met    Target Date  04/09/18        PT Long Term Goals - 03/29/18 0823      PT LONG TERM GOAL #1   Title  Patient will increase Berg Balance score by > 6 points to demonstrate decreased fall risk during functional  activities.    Baseline  36/56 Merrilee Jansky,;  03/29/18 Berg = 36/52     Time  12    Period  Weeks    Status  Partially Met    Target Date  05/21/18      PT LONG TERM GOAL #2   Title  Patient will reduce timed up and go to <11 seconds to reduce fall risk and demonstrate improved transfer/gait ability.    Baseline  15.18 sec with poor safety judgement and uncontrolled sit, 03/29/18=13.02 sec    Time  12    Period  Weeks    Status  Partially Met    Target Date  05/21/18      PT LONG TERM GOAL #3   Title  Patient will increase dynamic gait index score to >19/24 as to demonstrate reduced fall risk and improved dynamic gait balance for better safety with community/home ambulation.     Baseline  16/24, 03/29/18=16/25    Time  12    Period  Weeks    Status  Partially Met    Target Date  05/21/18            Plan - 03/29/18 0820    Clinical Impression Statement  Patient's goals were re-assessed today with progression of scores on 5 x sit to stand, TUG.  Patient fatigues with ambulation and has decreased gait speed with head turns, stepping over obstacles and turns. Patient's condition has the potential to improve in response to therapy. Frequent cueing for swinging arms allowed patient improved stability with standing tasks. Maximum improvement is yet to be obtained. The anticipated improvement is attainable and reasonable in a generally predictable time. Pt will benefit from additional skilled PT intervention for improvements in balance, strength, and gait safety    Rehab Potential  Good    PT Frequency  2x / week    PT Duration  12 weeks    PT Treatment/Interventions  Therapeutic activities;Therapeutic exercise;Balance training;Neuromuscular re-education;Functional mobility training;Gait training;Manual techniques    PT Next Visit Plan  balance and HEP    PT Home Exercise Plan  heel raises, bridges    Consulted and Agree with Plan of Care  Patient;Family member/caregiver       Patient will  benefit from skilled therapeutic intervention in order to improve the following deficits and impairments:  Abnormal gait, Decreased coordination, Decreased activity tolerance, Decreased  endurance, Decreased strength, Decreased balance, Decreased safety awareness, Difficulty walking  Visit Diagnosis: Muscle weakness (generalized)  Neurologic neglect syndrome  Other lack of coordination  Difficulty in walking, not elsewhere classified     Problem List Patient Active Problem List   Diagnosis Date Noted  . Sleep disturbance   . PAF (paroxysmal atrial fibrillation) ([place])   . Recurrent UTI   . Hypokalemia   . Hyponatremia   . Abnormal urine   . 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 Benign essential HTN   . Urinary retention   . History of gout   . Global aphasia   . SAH (subarachnoid hemorrhage) ([place])   . SDH (subdural hematoma) ([place])   . Lymphocytosis   . Subdural hemorrhage following injury ([place]) 05/12/2016    Alanson Puls,  PT DPT 03/29/2018, 8:35 AM  [place] MAIN [facility] SERVICES [address] [place], [place], [address] Phone: [phone]   Fax:  [phone]  Name: James [name] MRN: [id number] Date of Birth: [birth year]

## 2018-04-02 ENCOUNTER — Ambulatory Visit: Payer: PPO | Admitting: Occupational Therapy

## 2018-04-02 ENCOUNTER — Encounter: Payer: Self-pay | Admitting: Physical Therapy

## 2018-04-02 ENCOUNTER — Ambulatory Visit: Payer: PPO | Admitting: Physical Therapy

## 2018-04-02 DIAGNOSIS — R414 Neurologic neglect syndrome: Secondary | ICD-10-CM

## 2018-04-02 DIAGNOSIS — R278 Other lack of coordination: Secondary | ICD-10-CM

## 2018-04-02 DIAGNOSIS — M6281 Muscle weakness (generalized): Secondary | ICD-10-CM | POA: Diagnosis not present

## 2018-04-02 DIAGNOSIS — R262 Difficulty in walking, not elsewhere classified: Secondary | ICD-10-CM

## 2018-04-02 DIAGNOSIS — R2681 Unsteadiness on feet: Secondary | ICD-10-CM

## 2018-04-02 NOTE — Therapy (Signed)
[place] MAIN [facility] SERVICES [address] [place], [place], [address] Phone: [phone]   Fax:  [phone]  Physical Therapy Treatment  Patient Details  Name: James [name] MRN: [id number] Date of Birth: Sep 11, 1940 Referring Provider: shah   Encounter Date: 04/02/2018  PT End of Session - 04/02/18 0849    Visit Number  11    Number of Visits  25    Date for PT Re-Evaluation  05/21/18    Authorization Type  1/10 starting on 02/26/18 progress note    PT Start Time  0845    PT Stop Time  0930    PT Time Calculation (min)  45 min    Equipment Utilized During Treatment  Gait belt    Activity Tolerance  Patient tolerated treatment well    Behavior During Therapy  [facility] for tasks assessed/performed       Past Medical History:  Diagnosis Date  . Alcohol abuse, in remission   . Atrial fibrillation ([place])   . Bilateral renal masses   . Closed right ankle fracture   . 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 Gait disorder   . Gout   . Hypertension   . Seizure disorder ([place])   . 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     Past Surgical History:  Procedure Laterality Date  . CRANIECTOMY FOR DEPRESSED SKULL FRACTURE  2009   with MRSA infection  . CRANIOTOMY     X 2  . HERNIA REPAIR     during childhood  . ORIF ANKLE FRACTURE Left 2012    There were no vitals filed for this visit.  Subjective Assessment - 04/02/18 0848    Subjective  Pt reports that he is doing well today. No specific questions or concerns. Denies pain and no external signs of discomfort. Wife denies any health changes since last visit.    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  He suffered another fall in 2017.  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    Patient  Stated Goals  Patient wants to have a safer gait and not stagger or feel off balance.     Currently in Pain?  No/denies    Multiple Pain Sites  No       NEUROMUSCULAR RE-EDUCATION Warm up octane fitness x 5 mins , level 4 Toe tapping 6 inch stool without UE assist, cues for technique and to slow down  Tandem gait in // bars x 4 laps , posture correction cues to slow down Side stepping on blue  foam balance beam x 5 lengths of the parallel bars with posture correction cues, min assist 4 square fwd/bwd, side to side stepping/ diagonal stepping, cues to not step on the lines, CGA and cues for balance recovery  Tandem stand on disk and foam x 2 minutes, CGA and cues for posture Side stepping with head control, CGA and correction of feet direction Stepping onto AIREX, then on to 4 inch step followed by stepping down onto AIREX and then level surface in //bars x15.  Pt required occasional UE assist, and performance improved with each repetition   Stepping over hurdle, fwd and  back x10 bilaterally  ; CGA Side step and back flat surface  x10 bilaterally; CGA  Rocker board x 2 mins pt being unsteady and losing balance multiple times throughout each direction; increased time to complete task   Cues for proper technique of exercises, slow eccentric contractions to target specific muscles and facility increased muscle building. Therapeutic rest breaks for energy conservation                      PT Education - 04/02/18 0848    Education Details  HEP, safety, balance    Person(s) Educated  Patient    Methods  Explanation;Demonstration;Tactile cues;Verbal cues    Comprehension  Verbalized understanding;Verbal cues required;Need further instruction       PT Short Term Goals - 03/29/18 0820      PT SHORT TERM GOAL #1   Title  Patient will be independent in home exercise program to improve strength/mobility for better functional independence with ADLs.    Time  6    Period  Weeks     Status  Partially Met    Target Date  04/09/18      PT SHORT TERM GOAL #2   Title  Patient (> 77 years old) will complete five times sit to stand test in < 15 seconds indicating an increased LE strength and improved balance.    Baseline  19.15 sec and needs CGA to min assist to keep from falling, 03/29/18=16.82 sec    Time  6    Period  Weeks    Status  Partially Met    Target Date  04/09/18        PT Long Term Goals - 03/29/18 0823      PT LONG TERM GOAL #1   Title  Patient will increase Berg Balance score by > 6 points to demonstrate decreased fall risk during functional activities.    Baseline  36/56 Merrilee Jansky,;  03/29/18 Berg = 36/52     Time  12    Period  Weeks    Status  Partially Met    Target Date  05/21/18      PT LONG TERM GOAL #2   Title  Patient will reduce timed up and go to <11 seconds to reduce fall risk and demonstrate improved transfer/gait ability.    Baseline  15.18 sec with poor safety judgement and uncontrolled sit, 03/29/18=13.02 sec    Time  12    Period  Weeks    Status  Partially Met    Target Date  05/21/18      PT LONG TERM GOAL #3   Title  Patient will increase dynamic gait index score to >19/24 as to demonstrate reduced fall risk and improved dynamic gait balance for better safety with community/home ambulation.     Baseline  16/24, 03/29/18=16/25    Time  12    Period  Weeks    Status  Partially Met    Target Date  05/21/18            Plan - 04/02/18 0911    Clinical Impression Statement  Pt requires direction and verbal cues for correct performance of standing dynamic balance exercises and strengthening exercises. .Patient has fatigue with endurance and difficulty with transfers and standing tasks.  Patient struggles with posture and ability to perform sit to stand due to weakness, as well as balance with mobility.  Pt encouraged continuing HEP   Patient will benefit from continued skilled PT to improve mobility and safety.    Rehab Potential   Good    PT  Frequency  2x / week    PT Duration  12 weeks    PT Treatment/Interventions  Therapeutic activities;Therapeutic exercise;Balance training;Neuromuscular re-education;Functional mobility training;Gait training;Manual techniques    PT Next Visit Plan  balance and HEP    PT Home Exercise Plan  heel raises, bridges    Consulted and Agree with Plan of Care  Patient;Family member/caregiver       Patient will benefit from skilled therapeutic intervention in order to improve the following deficits and impairments:  Abnormal gait, Decreased coordination, Decreased activity tolerance, Decreased endurance, Decreased strength, Decreased balance, Decreased safety awareness, Difficulty walking  Visit Diagnosis: Muscle weakness (generalized)  Neurologic neglect syndrome  Other lack of coordination  Difficulty in walking, not elsewhere classified  Unsteadiness on feet     Problem List Patient Active Problem List   Diagnosis Date Noted  . Sleep disturbance   . PAF (paroxysmal atrial fibrillation) ([place])   . Recurrent UTI   . Hypokalemia   . Hyponatremia   . Abnormal urine   . 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 Benign essential HTN   . Urinary retention   . History of gout   . Global aphasia   . SAH (subarachnoid hemorrhage) ([place])   . SDH (subdural hematoma) ([place])   . Lymphocytosis   . Subdural hemorrhage following injury ([place]) 05/12/2016    Alanson Puls, PT DPT 04/02/2018, 9:15 AM  [place] MAIN [facility] SERVICES [address] [place], [place], [address] Phone: [phone]   Fax:  [phone]  Name: Sophie Quiles MRN: [id number] Date of Birth: [birth year]

## 2018-04-03 ENCOUNTER — Encounter: Payer: Self-pay | Admitting: Occupational Therapy

## 2018-04-03 NOTE — Therapy (Signed)
[place] MAIN [facility] SERVICES [address] [place], [place], [address] Phone: [phone]   Fax:  [phone]  Occupational Therapy Treatment  Patient Details  Name: James [name] MRN: [id number] Date of Birth: 1940/11/20 Referring Provider: shah   Encounter Date: 03/29/2018  OT End of Session - 04/03/18 0957    Visit Number  87    Number of Visits  72    Date for OT Re-Evaluation  06/08/18    Authorization Type  Medicare visit 4/10    OT Start Time  0845    OT Stop Time  0931    OT Time Calculation (min)  46 min    Activity Tolerance  Patient tolerated treatment well    Behavior During Therapy  [facility] for tasks assessed/performed       Past Medical History:  Diagnosis Date  . Alcohol abuse, in remission   . Atrial fibrillation ([place])   . Bilateral renal masses   . Closed right ankle fracture   . 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 Gait disorder   . Gout   . Hypertension   . Seizure disorder ([place])   . 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     Past Surgical History:  Procedure Laterality Date  . CRANIECTOMY FOR DEPRESSED SKULL FRACTURE  2009   with MRSA infection  . CRANIOTOMY     X 2  . HERNIA REPAIR     during childhood  . ORIF ANKLE FRACTURE Left 2012    There were no vitals filed for this visit.  Subjective Assessment - 04/03/18 0956    Subjective   Patient's wife reports things are going well and patient has continued to work on exercises at home, one day  he spent hours on a task for coordination and was very focused.     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  He suffered another fall in 2017.  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     Patient Stated Goals  to be able to use the right hand functionally     Currently in Pain?  No/denies    Multiple Pain Sites  No        Patient seen for Manipulation of Strong magnets with hooks placing on elevated magnetic board with washers to hang, min to  moderate difficulty with task, cues for prehension patterns and to use an oppositional pattern to hold washers to hook onto magnets.   Has more difficulty with removing the items from the hooks than placing.  Dropped 5 of 20 this date.   Manipulation of Small magnets with prehension patterns to hold item with the same pattern as holding a push pin with thumb and index  finger, cues for graded pressure.  Has difficulty with separating magnets if they hook together.                     OT Education - 04/03/18 [phone]    Education provided  Yes    Education Details  fine motor coordination, manipulation of washers and magnets    Person(s) Educated  Patient    Methods  Explanation;Demonstration;Verbal cues    Comprehension  Verbal cues required;Returned demonstration;Verbalized understanding  OT Long Term Goals - 03/24/18 1633      OT LONG TERM GOAL #1   Title  Patient will improve right hand function to hold utensil in right hand for self feeding with modified independence.     Baseline  unable to hold utensil at eval, now able to hold utensils and self feed with minimal spillage.    Time  12    Period  Weeks    Status  Achieved      OT LONG TERM GOAL #2   Title  Pt will increase hand strength in right by 5# to be able to cut food with modified independence.    Baseline  unable at eval, able to cut meat with occasional assist at 30 visit.    Time  12    Period  Weeks    Status  Achieved      OT LONG TERM GOAL #3   Title  Patient will demonstrate increased awareness and strategies to attend to right side to hold items without dropping items.    Baseline  drops items frequently, 30 visit-patient dropping objects less frequently and is more aware when holding objects in hand     Time  12    Period  Weeks    Status  On-going      OT LONG TERM GOAL #4   Title  Patient will demonstrate independence in home exercise program for ROM, strength and coordination of right hand.    Baseline  no current program, modified independent with current program at 30 visit but continuing to add exercises.    Time  6    Period  Weeks    Status  On-going      OT LONG TERM GOAL #5   Title  Patient will demonstrate the ability to obtain items from right pocket 90% of the time.    Baseline  difficulty getting keys or coins out of right pocket, 30 visit update-able to demonstrate 75% of the time    Time  12    Period  Weeks    Status  On-going      OT LONG TERM GOAL #6   Title  Patient will demonstrate pouring a drink with the right hand with attention to right side and not knocking the cup over and spilling.      Baseline  patient often does not realize the cup is on his right side and knocks it over when attempting to get a drink    Time  12    Period  Weeks    Status  On-going            Plan - 04/03/18 0957    Clinical Impression Statement  Patient continues to work towards calibration of movements with right hand for manipulation and fine motor coordiation skills. He has difficulty with picking up and manipulation of smaller objects especially less than 1.2 inch in size but has continued to work towards improving prehension patterns especially picking up washers and coins with an oppositional pattern, cues and therapist demonstration.  Patient continues to drop items frequently but realizes when he drops them and will work on picking them up from the floor.  He is using his right hand more spontaneously and will often recognize and correct if he attempts to use his left nondominant first.  Wife reports she has noticed a difference in this at home. Continue to work towards goals to increase independence in daily tasks and hand function on  the right .     Occupational  Profile and client history currently impacting functional performance  repeated falls, TBI, expressive aphasia, limited motion in right hand, lack of coordination from previous CVA    Occupational performance deficits (Please refer to evaluation for details):  ADL's;IADL's;Leisure    Rehab Potential  Good    Current Impairments/barriers affecting progress:  expressive aphasia, falls, neglect    OT Frequency  2x / week    OT Duration  12 weeks    OT Treatment/Interventions  Self-care/ADL training;Visual/perceptual remediation/compensation;DME and/or AE instruction;Patient/family education;Moist Heat;Therapeutic exercise;Manual Therapy;Therapeutic activities;Neuromuscular education    Consulted and Agree with Plan of Care  Patient       Patient will benefit from skilled therapeutic intervention in order to improve the following deficits and impairments:  Impaired vision/preception, Decreased coordination, Decreased safety awareness, Impaired UE functional use, Decreased strength  Visit Diagnosis: Muscle weakness (generalized)  Neurologic neglect syndrome  Other lack of coordination    Problem List Patient Active Problem List   Diagnosis Date Noted  . Sleep disturbance   . PAF (paroxysmal atrial fibrillation) ([place])   . Recurrent UTI   . Hypokalemia   . Hyponatremia   . Abnormal urine   . 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 Benign essential HTN   . Urinary retention   . History of gout   . Global aphasia   . SAH (subarachnoid hemorrhage) ([place])   . SDH (subdural hematoma) ([place])   . Lymphocytosis   . Subdural hemorrhage following injury ([place]) 05/12/2016   Lindsee Labarre T Matteo Banke, OTR/L, CLT  Decklyn Hornik 04/03/2018, 10:02 AM  [place] MAIN [facility] SERVICES [place], [place], [address] Phone: [phone]   Fax:  [phone]  Name: Keimari Racine MRN: [id number] Date of Birth: 10/25/1940

## 2018-04-04 ENCOUNTER — Ambulatory Visit: Payer: PPO | Admitting: Occupational Therapy

## 2018-04-04 ENCOUNTER — Encounter: Payer: Self-pay | Admitting: Occupational Therapy

## 2018-04-04 ENCOUNTER — Encounter: Payer: Self-pay | Admitting: Physical Therapy

## 2018-04-04 ENCOUNTER — Ambulatory Visit: Payer: PPO | Admitting: Physical Therapy

## 2018-04-04 DIAGNOSIS — M6281 Muscle weakness (generalized): Secondary | ICD-10-CM

## 2018-04-04 DIAGNOSIS — R278 Other lack of coordination: Secondary | ICD-10-CM

## 2018-04-04 DIAGNOSIS — R2681 Unsteadiness on feet: Secondary | ICD-10-CM

## 2018-04-04 DIAGNOSIS — R414 Neurologic neglect syndrome: Secondary | ICD-10-CM

## 2018-04-04 DIAGNOSIS — R262 Difficulty in walking, not elsewhere classified: Secondary | ICD-10-CM

## 2018-04-04 NOTE — Therapy (Signed)
[place] MAIN [facility] SERVICES [address] [place], [place], [address] Phone: [phone]   Fax:  [phone]  Occupational Therapy Treatment  Patient Details  Name: James [name] MRN: [id number] Date of Birth: 1941/03/31 Referring Provider: shah   Encounter Date: 04/02/2018  OT End of Session - 04/04/18 2043    Visit Number  55    Number of Visits  40    Date for OT Re-Evaluation  06/08/18    Authorization Type  Medicare visit 5/10    OT Start Time  0800    OT Stop Time  0845    OT Time Calculation (min)  45 min    Activity Tolerance  Patient tolerated treatment well    Behavior During Therapy  [facility] for tasks assessed/performed       Past Medical History:  Diagnosis Date  . Alcohol abuse, in remission   . Atrial fibrillation ([place])   . Bilateral renal masses   . Closed right ankle fracture   . 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 Gait disorder   . Gout   . Hypertension   . Seizure disorder ([place])   . 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     Past Surgical History:  Procedure Laterality Date  . CRANIECTOMY FOR DEPRESSED SKULL FRACTURE  2009   with MRSA infection  . CRANIOTOMY     X 2  . HERNIA REPAIR     during childhood  . ORIF ANKLE FRACTURE Left 2012    There were no vitals filed for this visit.  Subjective Assessment - 04/04/18 2041    Subjective   Patient indicating he had a good weekend, wife reports she ordered a magnetic board for patient to use at home to work on similar tasks we are doing in the clinic.    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  He suffered another fall in 2017.  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     Patient Stated Goals  to be able to use the right hand functionally    Currently  in Pain?  No/denies    Multiple Pain Sites  No          Patient seen for manipulation of Ball pegs, patient was previously unable to pick up ball pegs from a container and  only if the therapist handed the pegs to patient.  Today he was able to pick up the pegs, cues for turning and manipulating  pieces to place into grid.  Nice improvement noted. Patient seen for finger strengthening with use of push pins and placing into moderate resistive Bulletin board, cues for  prehension patterns with thumb and index finger and to push into board fully.                   OT Education - 04/04/18 2042    Education provided  Yes    Education Details  prehension patterns, manipulation skills    Person(s) Educated  Patient    Methods  Explanation;Demonstration;Verbal cues    Comprehension  Verbal cues required;Returned demonstration;Verbalized understanding          OT Long Term Goals - 03/24/18 1633      OT LONG TERM GOAL #1   Title  Patient will improve right hand function to hold utensil in right hand for self feeding with modified independence.     Baseline  unable to hold utensil at eval, now able to hold utensils and self feed with minimal spillage.    Time  12    Period  Weeks    Status  Achieved      OT LONG TERM GOAL #2   Title  Pt will increase hand strength in right by 5# to be able to cut food with modified independence.    Baseline  unable at eval, able to cut meat with occasional assist at 30 visit.    Time  12    Period  Weeks    Status  Achieved      OT LONG TERM GOAL #3   Title  Patient will demonstrate increased awareness and strategies to attend to right side to hold items without dropping items.    Baseline  drops items frequently, 30 visit-patient dropping objects less frequently and is more aware when holding objects in hand    Time  12    Period  Weeks    Status  On-going      OT LONG TERM GOAL #4   Title  Patient will demonstrate independence  in home exercise program for ROM, strength and coordination of right hand.    Baseline  no current program, modified independent with current program at 30 visit but continuing to add exercises.    Time  6    Period  Weeks    Status  On-going      OT LONG TERM GOAL #5   Title  Patient will demonstrate the ability to obtain items from right pocket 90% of the time.    Baseline  difficulty getting keys or coins out of right pocket, 30 visit update-able to demonstrate 75% of the time    Time  12    Period  Weeks    Status  On-going      OT LONG TERM GOAL #6   Title  Patient will demonstrate pouring a drink with the right hand with attention to right side and not knocking the cup over and spilling.      Baseline  patient often does not realize the cup is on his right side and knocks it over when attempting to get a drink    Time  12    Period  Weeks    Status  On-going            Plan - 04/04/18 2043    Clinical Impression Statement  Patient demonstrates significant progress with his right hand in terms of manipulation skills and use of prehension patterns.  He responds well to cues and therapist demo of patterns with index and thumb as well as patterns with middle finger and thumb while using index finger to turn and manipulate pieces. Continue to work towards goals to improve hand function and attention to right side.      Occupational Profile and client history currently impacting functional performance  repeated falls, TBI, expressive aphasia, limited motion in right hand, lack of coordination from previous CVA    Occupational performance deficits (Please refer to evaluation for details):  ADL's;IADL's;Leisure    Rehab Potential  Good    Current Impairments/barriers affecting progress:  expressive aphasia, falls, neglect    OT Frequency  2x / week    OT Duration  12 weeks    OT Treatment/Interventions  Self-care/ADL training;Visual/perceptual  remediation/compensation;DME and/or AE  instruction;Patient/family education;Moist Heat;Therapeutic exercise;Manual Therapy;Therapeutic activities;Neuromuscular education    Consulted and Agree with Plan of Care  Patient       Patient will benefit from skilled therapeutic intervention in order to improve the following deficits and impairments:  Impaired vision/preception, Decreased coordination, Decreased safety awareness, Impaired UE functional use, Decreased strength  Visit Diagnosis: Muscle weakness (generalized)  Neurologic neglect syndrome  Other lack of coordination    Problem List Patient Active Problem List   Diagnosis Date Noted  . Sleep disturbance   . PAF (paroxysmal atrial fibrillation) ([place])   . Recurrent UTI   . Hypokalemia   . Hyponatremia   . Abnormal urine   . 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 Benign essential HTN   . Urinary retention   . History of gout   . Global aphasia   . SAH (subarachnoid hemorrhage) ([place])   . SDH (subdural hematoma) ([place])   . Lymphocytosis   . Subdural hemorrhage following injury [facility]) 05/12/2016   James [name] T Opal Mckellips, OTR/L, CLT James [name] 04/04/2018, 8:44 PM  [place] MAIN [facility] SERVICES [address] [place], [place], [address] Phone: [phone]   Fax:  [phone]  Name: James [name] MRN: [id number] Date of Birth: Oct 14, 1940

## 2018-04-04 NOTE — Therapy (Signed)
[place] MAIN [facility] SERVICES [address] [place], [place], [address] Phone: [phone]   Fax:  [phone]  Physical Therapy Treatment  Patient Details  Name: James [name] MRN: [id number] Date of Birth: 1941-03-20 Referring Provider: shah   Encounter Date: 04/04/2018  PT End of Session - 04/04/18 0821    Visit Number  12    Number of Visits  25    Date for PT Re-Evaluation  05/21/18    Authorization Type  2/10 starting on 02/26/18 progress note    PT Start Time  0802    PT Stop Time  0845    PT Time Calculation (min)  43 min    Equipment Utilized During Treatment  Gait belt    Activity Tolerance  Patient tolerated treatment well    Behavior During Therapy  [facility] for tasks assessed/performed       Past Medical History:  Diagnosis Date  . Alcohol abuse, in remission   . Atrial fibrillation ([place])   . Bilateral renal masses   . Closed right ankle fracture   . 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 Gait disorder   . Gout   . Hypertension   . Seizure disorder ([place])   . 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     Past Surgical History:  Procedure Laterality Date  . CRANIECTOMY FOR DEPRESSED SKULL FRACTURE  2009   with MRSA infection  . CRANIOTOMY     X 2  . HERNIA REPAIR     during childhood  . ORIF ANKLE FRACTURE Left 2012    There were no vitals filed for this visit.  Subjective Assessment - 04/04/18 0820    Subjective  Pt reports that he is doing well today. No specific questions or concerns. Denies pain and no external signs of discomfort. Wife denies any health changes since last visit.    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  He suffered another fall in 2017.  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    Patient  Stated Goals  Patient wants to have a safer gait and not stagger or feel off balance.     Currently in Pain?  No/denies    Multiple Pain Sites  No       NEUROMUSCULAR RE-EDUCATION   Airex NBOS head turns x 20  each;cues for posture correction, 25 % UE support   Airex feet apart and tapping laterally, fwd/bwd, yellow disk x 20 cues for posture correction, loss of motor control for RLE foot placement   Airex cone  reaching fwd, laterally and, low  crossing midline cues for looking up , with 50% UE support    Toe tapping 6 inch stool without UE assist x 20 , cues for technique,   Side stepping  gait in // bars x 4 laps , posture correction cues   Side stepping on blue  foam balance beam x 5 lengths of the parallel bars with posture correction cues   4 square fwd/bwd, side to side stepping/ diagonal stepping, cues to not step on the lines  Rocker board  and trunk rotation with yellow thera ball , fwd/bwd, and lateral shifts  with 50% UE support x 2 mins  Tandem standing on 1/2 foam with flat  side up and cone stacking on stool with need of min assist x 20 cones x 2 sets , 25 % UE support   TM side stepping with elevation 2 and . 4 miles / hour , 50 % UE support   Cues for proper technique of exercises, slow eccentric contractions to target specific muscles                          PT Education - 04/04/18 0820    Education Details  balance, hep, saftey    Person(s) Educated  Patient    Methods  Explanation;Demonstration;Tactile cues;Verbal cues    Comprehension  Verbalized understanding;Returned demonstration;Need further instruction       PT Short Term Goals - 03/29/18 0820      PT SHORT TERM GOAL #1   Title  Patient will be independent in home exercise program to improve strength/mobility for better functional independence with ADLs.    Time  6    Period  Weeks    Status  Partially Met    Target Date  04/09/18      PT SHORT TERM GOAL #2   Title   Patient (> 50 years old) will complete five times sit to stand test in < 15 seconds indicating an increased LE strength and improved balance.    Baseline  19.15 sec and needs CGA to min assist to keep from falling, 03/29/18=16.82 sec    Time  6    Period  Weeks    Status  Partially Met    Target Date  04/09/18        PT Long Term Goals - 03/29/18 0823      PT LONG TERM GOAL #1   Title  Patient will increase Berg Balance score by > 6 points to demonstrate decreased fall risk during functional activities.    Baseline  36/56 Merrilee Jansky,;  03/29/18 Berg = 36/52     Time  12    Period  Weeks    Status  Partially Met    Target Date  05/21/18      PT LONG TERM GOAL #2   Title  Patient will reduce timed up and go to <11 seconds to reduce fall risk and demonstrate improved transfer/gait ability.    Baseline  15.18 sec with poor safety judgement and uncontrolled sit, 03/29/18=13.02 sec    Time  12    Period  Weeks    Status  Partially Met    Target Date  05/21/18      PT LONG TERM GOAL #3   Title  Patient will increase dynamic gait index score to >19/24 as to demonstrate reduced fall risk and improved dynamic gait balance for better safety with community/home ambulation.     Baseline  16/24, 03/29/18=16/25    Time  12    Period  Weeks    Status  Partially Met    Target Date  05/21/18            Plan - 04/04/18 3845    Clinical Impression Statement  Patient demonstrates improved stability and strength allowing patient to perform short duration standing interventions with rest periods.  Patient performs intermediate standing dynamic standing balance exercises to shift weight and perform single leg standing activities with min and mod assist. Patient fatigues moderately with exercises requiring rest breaks at this time. Patient will continue to benefit from skilled physical therapy to improve pain and mobility.  Rehab Potential  Good    PT Frequency  2x / week    PT Duration  12 weeks    PT  Treatment/Interventions  Therapeutic activities;Therapeutic exercise;Balance training;Neuromuscular re-education;Functional mobility training;Gait training;Manual techniques    PT Next Visit Plan  balance and HEP    PT Home Exercise Plan  heel raises, bridges    Consulted and Agree with Plan of Care  Patient;Family member/caregiver       Patient will benefit from skilled therapeutic intervention in order to improve the following deficits and impairments:  Abnormal gait, Decreased coordination, Decreased activity tolerance, Decreased endurance, Decreased strength, Decreased balance, Decreased safety awareness, Difficulty walking  Visit Diagnosis: Muscle weakness (generalized)  Neurologic neglect syndrome  Other lack of coordination  Difficulty in walking, not elsewhere classified  Unsteadiness on feet     Problem List Patient Active Problem List   Diagnosis Date Noted  . Sleep disturbance   . PAF (paroxysmal atrial fibrillation) ([place])   . Recurrent UTI   . Hypokalemia   . Hyponatremia   . Abnormal urine   . 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 Benign essential HTN   . Urinary retention   . History of gout   . Global aphasia   . SAH (subarachnoid hemorrhage) ([place])   . SDH (subdural hematoma) ([place])   . Lymphocytosis   . Subdural hemorrhage following injury ([place]) 05/12/2016    Alanson Puls, PT DPT 04/04/2018, 8:24 AM  [place] MAIN [facility] SERVICES [address] [place], [place], [address] Phone: [phone]   Fax:  [phone]  Name: James [name] MRN: [id number] Date of Birth: [birth year]

## 2018-04-04 NOTE — Therapy (Signed)
[place] MAIN [facility] SERVICES [address] [place], [place], [address] Phone: [phone]   Fax:  [phone]  Occupational Therapy Treatment  Patient Details  Name: James [name] MRN: [id number] Date of Birth: 1941-06-11 Referring Provider: shah   Encounter Date: 04/04/2018  OT End of Session - 04/04/18 2047    Visit Number  56    Number of Visits  84    Date for OT Re-Evaluation  06/08/18    Authorization Type  Medicare visit 6/10    OT Start Time  0846    OT Stop Time  0930    OT Time Calculation (min)  44 min    Activity Tolerance  Patient tolerated treatment well    Behavior During Therapy  [facility] for tasks assessed/performed       Past Medical History:  Diagnosis Date  . Alcohol abuse, in remission   . Atrial fibrillation ([place])   . Bilateral renal masses   . Closed right ankle fracture   . 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 Gait disorder   . Gout   . Hypertension   . Seizure disorder ([place])   . 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     Past Surgical History:  Procedure Laterality Date  . CRANIECTOMY FOR DEPRESSED SKULL FRACTURE  2009   with MRSA infection  . CRANIOTOMY     X 2  . HERNIA REPAIR     during childhood  . ORIF ANKLE FRACTURE Left 2012    There were no vitals filed for this visit.  Subjective Assessment - 04/04/18 2046    Subjective   Patient denies any pain, smiling and laughing during treatment session.  Indicates some frustration with manipulation skills.     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  He suffered another fall in 2017.  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     Patient Stated Goals  to be able to use the right hand functionally    Currently in Pain?  No/denies    Multiple Pain  Sites  No           Patient seen for manipulation of Checker sized objects to pick up from flat surface and place into grid in an elevated plane.    Patient has difficulty with holding more than one piece at a time, using the hand for storage and then moving pieces to fingertips.   Switched focus to management of one item with prehension between thumb and index with cues.  Patient able to demonstrate  increased scanning to right side to detect patterns.   Manipulation of [place] discs with middle finger and thumb with use of index finger to turn disk to alternate side. Cues for  prehension patterns.  Handwriting this date with focus on first and last name.  Multiple trials completed with fair legibility.                  OT Education - 04/04/18 2047    Education provided  Yes    Education Details  prehension patterns, manipulation skills    Person(s) Educated  Patient    Methods  Explanation;Demonstration;Verbal cues    Comprehension  Verbal cues required;Returned demonstration;Verbalized understanding  OT Long Term Goals - 03/24/18 1633      OT LONG TERM GOAL #1   Title  Patient will improve right hand function to hold utensil in right hand for self feeding with modified independence.     Baseline  unable to hold utensil at eval, now able to hold utensils and self feed with minimal spillage.    Time  12    Period  Weeks    Status  Achieved      OT LONG TERM GOAL #2   Title  Pt will increase hand strength in right by 5# to be able to cut food with modified independence.    Baseline  unable at eval, able to cut meat with occasional assist at 30 visit.    Time  12    Period  Weeks    Status  Achieved      OT LONG TERM GOAL #3   Title  Patient will demonstrate increased awareness and strategies to attend to right side to hold items without dropping items.    Baseline  drops items frequently, 30 visit-patient dropping objects less frequently and is  more aware when holding objects in hand    Time  12    Period  Weeks    Status  On-going      OT LONG TERM GOAL #4   Title  Patient will demonstrate independence in home exercise program for ROM, strength and coordination of right hand.    Baseline  no current program, modified independent with current program at 30 visit but continuing to add exercises.    Time  6    Period  Weeks    Status  On-going      OT LONG TERM GOAL #5   Title  Patient will demonstrate the ability to obtain items from right pocket 90% of the time.    Baseline  difficulty getting keys or coins out of right pocket, 30 visit update-able to demonstrate 75% of the time    Time  12    Period  Weeks    Status  On-going      OT LONG TERM GOAL #6   Title  Patient will demonstrate pouring a drink with the right hand with attention to right side and not knocking the cup over and spilling.      Baseline  patient often does not realize the cup is on his right side and knocks it over when attempting to get a drink    Time  12    Period  Weeks    Status  On-going            Plan - 04/04/18 2047    Clinical Impression Statement  Patient has improved with manipulation skills and picking up items.  Continues to demonstrate difficulty with translatory movements of the hand as well as using the hand for storage.  Encouraged patient to continue to work on functional hand skills at home as well as handwriting tasks.     Occupational Profile and client history currently impacting functional performance  repeated falls, TBI, expressive aphasia, limited motion in right hand, lack of coordination from previous CVA    Occupational performance deficits (Please refer to evaluation for details):  ADL's;IADL's;Leisure    Rehab Potential  Good    Current Impairments/barriers affecting progress:  expressive aphasia, falls, neglect    OT Frequency  2x / week    OT Duration  12 weeks    OT Treatment/Interventions  Self-care/ADL  training;Visual/perceptual remediation/compensation;DME and/or AE instruction;Patient/family education;Moist Heat;Therapeutic exercise;Manual Therapy;Therapeutic activities;Neuromuscular education    Consulted and Agree with Plan of Care  Patient       Patient will benefit from skilled therapeutic intervention in order to improve the following deficits and impairments:  Impaired vision/preception, Decreased coordination, Decreased safety awareness, Impaired UE functional use, Decreased strength  Visit Diagnosis: Muscle weakness (generalized)  Other lack of coordination  Neurologic neglect syndrome    Problem List Patient Active Problem List   Diagnosis Date Noted  . Sleep disturbance   . PAF (paroxysmal atrial fibrillation) ([place])   . Recurrent UTI   . Hypokalemia   . Hyponatremia   . Abnormal urine   . 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 Benign essential HTN   . Urinary retention   . History of gout   . Global aphasia   . SAH (subarachnoid hemorrhage) ([place])   . SDH (subdural hematoma) ([place])   . Lymphocytosis   . Subdural hemorrhage following injury ([place]) 05/12/2016   Abria Vannostrand T Lorelee Mclaurin, OTR/L, CLT  Quentina Fronek 04/04/2018, 9:08 PM  [place] MAIN [facility] SERVICES [address] [place], [place], [address] Phone: [phone]   Fax:  [phone]  Name: Chey Cho MRN: [id number] Date of Birth: Jan 17, 1941

## 2018-04-09 ENCOUNTER — Ambulatory Visit: Payer: PPO | Admitting: Occupational Therapy

## 2018-04-09 ENCOUNTER — Encounter: Payer: Self-pay | Admitting: Occupational Therapy

## 2018-04-09 ENCOUNTER — Encounter: Payer: Self-pay | Admitting: Physical Therapy

## 2018-04-09 ENCOUNTER — Ambulatory Visit: Payer: PPO | Admitting: Physical Therapy

## 2018-04-09 DIAGNOSIS — R262 Difficulty in walking, not elsewhere classified: Secondary | ICD-10-CM

## 2018-04-09 DIAGNOSIS — M6281 Muscle weakness (generalized): Secondary | ICD-10-CM | POA: Diagnosis not present

## 2018-04-09 DIAGNOSIS — R278 Other lack of coordination: Secondary | ICD-10-CM

## 2018-04-09 DIAGNOSIS — R2681 Unsteadiness on feet: Secondary | ICD-10-CM

## 2018-04-09 DIAGNOSIS — R414 Neurologic neglect syndrome: Secondary | ICD-10-CM

## 2018-04-09 NOTE — Therapy (Signed)
[place] MAIN [facility] SERVICES [address] [place], [place], [address] Phone: [phone]   Fax:  [phone]  Physical Therapy Treatment  Patient Details  Name: James [name] MRN: [id number] Date of Birth: 1940-11-11 Referring Provider: shah   Encounter Date: 04/09/2018  PT End of Session - 04/09/18 0844    Visit Number  13    Number of Visits  25    Date for PT Re-Evaluation  05/21/18    Authorization Type  3/10 starting on 02/26/18 progress note    PT Start Time  0845    PT Stop Time  0930    PT Time Calculation (min)  45 min    Equipment Utilized During Treatment  Gait belt    Activity Tolerance  Patient tolerated treatment well    Behavior During Therapy  [facility] for tasks assessed/performed       Past Medical History:  Diagnosis Date  . Alcohol abuse, in remission   . Atrial fibrillation ([place])   . Bilateral renal masses   . Closed right ankle fracture   . 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 Gait disorder   . Gout   . Hypertension   . Seizure disorder ([place])   . 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     Past Surgical History:  Procedure Laterality Date  . CRANIECTOMY FOR DEPRESSED SKULL FRACTURE  2009   with MRSA infection  . CRANIOTOMY     X 2  . HERNIA REPAIR     during childhood  . ORIF ANKLE FRACTURE Left 2012    There were no vitals filed for this visit.  Subjective Assessment - 04/09/18 0843    Subjective  Pt reports that he is doing well today. No specific questions or concerns. Denies pain and no external signs of discomfort. Wife denies any health changes since last visit.    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  He suffered another fall in 2017.  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    Patient  Stated Goals  Patient wants to have a safer gait and not stagger or feel off balance.     Currently in Pain?  No/denies    Multiple Pain Sites  No         Neuromuscular Re-education  Airex balance with toe taps to 6" step alternating LE x 10 each, faded UE support , continues to need UE support 75%  Static balance on 1/2 bolster (flat side up) with no UE support 30 s x 2   Rockerboard: Lateral weight shift x10 reps each direction, no UE support, CGA for safety with VCs to control the board tap in each direction and not letting it hit too hard   Feet apart on BOSU ball and head turns  x10 reps,  UE support, CGA for safety with VCs to utilize core  Airex pad  Head turns  x2 min, CGA for safety, demonstrated difficulty with catching the ball on its return and required VCs to control the speed of toss to control the return speed  Airex pad, balloon passes x2 min, supervision for safety with varying directions and speed of balloon, VCs for utilizing both hands and minimizing UE support Agility ladder with Min assist  BOSU  ball lunges with single UE support x 15 BLE , cues and modeling / demonstration for instructions    TM walking bwd with min assist x 5 mins . 4 miles / hour  Matrix side stepping and bwd walking x 5 mins   CGA and Min to mod verbal cues used throughout with increased in postural sway and LOB most seen with narrow base of support and while on uneven surfaces. Continues to have balance deficits typical with diagnosis. Patient performs intermediate level exercises without pain behaviors and needs verbal cuing for postural alignment and head positioning                        PT Education - 04/09/18 0843    Education Details  saftey, HEP, balance    Person(s) Educated  Patient    Methods  Explanation;Verbal cues;Tactile cues    Comprehension  Verbalized understanding;Returned demonstration;Need further instruction       PT Short Term Goals - 03/29/18  0820      PT SHORT TERM GOAL #1   Title  Patient will be independent in home exercise program to improve strength/mobility for better functional independence with ADLs.    Time  6    Period  Weeks    Status  Partially Met    Target Date  04/09/18      PT SHORT TERM GOAL #2   Title  Patient (> 98 years old) will complete five times sit to stand test in < 15 seconds indicating an increased LE strength and improved balance.    Baseline  19.15 sec and needs CGA to min assist to keep from falling, 03/29/18=16.82 sec    Time  6    Period  Weeks    Status  Partially Met    Target Date  04/09/18        PT Long Term Goals - 03/29/18 0823      PT LONG TERM GOAL #1   Title  Patient will increase Berg Balance score by > 6 points to demonstrate decreased fall risk during functional activities.    Baseline  36/56 Merrilee Jansky,;  03/29/18 Berg = 36/52     Time  12    Period  Weeks    Status  Partially Met    Target Date  05/21/18      PT LONG TERM GOAL #2   Title  Patient will reduce timed up and go to <11 seconds to reduce fall risk and demonstrate improved transfer/gait ability.    Baseline  15.18 sec with poor safety judgement and uncontrolled sit, 03/29/18=13.02 sec    Time  12    Period  Weeks    Status  Partially Met    Target Date  05/21/18      PT LONG TERM GOAL #3   Title  Patient will increase dynamic gait index score to >19/24 as to demonstrate reduced fall risk and improved dynamic gait balance for better safety with community/home ambulation.     Baseline  16/24, 03/29/18=16/25    Time  12    Period  Weeks    Status  Partially Met    Target Date  05/21/18            Plan - 04/09/18 0844    Clinical Impression Statement  Patient demonstrated improved coordination with increased repetition and practice. Initiation of new task requires verbal, visual, and demonstrative cueing to be performed with tactile correction. Decreased correction  and cueing was needed as task was repeated.  Patient able to perform open and closed chain exercises.  Patient would benefit from additional skilled PT intervention to improve coordination and balance.    Rehab Potential  Good    PT Frequency  2x / week    PT Duration  12 weeks    PT Treatment/Interventions  Therapeutic activities;Therapeutic exercise;Balance training;Neuromuscular re-education;Functional mobility training;Gait training;Manual techniques    PT Next Visit Plan  balance and HEP    PT Home Exercise Plan  heel raises, bridges    Consulted and Agree with Plan of Care  Patient;Family member/caregiver       Patient will benefit from skilled therapeutic intervention in order to improve the following deficits and impairments:  Abnormal gait, Decreased coordination, Decreased activity tolerance, Decreased endurance, Decreased strength, Decreased balance, Decreased safety awareness, Difficulty walking  Visit Diagnosis: Muscle weakness (generalized)  Other lack of coordination  Neurologic neglect syndrome  Difficulty in walking, not elsewhere classified  Unsteadiness on feet     Problem List Patient Active Problem List   Diagnosis Date Noted  . Sleep disturbance   . PAF (paroxysmal atrial fibrillation) ([place])   . Recurrent UTI   . Hypokalemia   . Hyponatremia   . Abnormal urine   . 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 Benign essential HTN   . Urinary retention   . History of gout   . Global aphasia   . SAH (subarachnoid hemorrhage) ([place])   . SDH (subdural hematoma) ([place])   . Lymphocytosis   . Subdural hemorrhage following injury ([place]) 05/12/2016    Alanson Puls, PT DPT 04/09/2018, 8:53 AM  [place] MAIN [facility] SERVICES [address] [place], [place], [address] Phone: [phone]   Fax:  [phone]  Name: James [name] MRN: [id number] Date of Birth: 1940-12-23

## 2018-04-11 ENCOUNTER — Ambulatory Visit: Payer: PPO | Admitting: Occupational Therapy

## 2018-04-11 ENCOUNTER — Ambulatory Visit: Payer: PPO | Admitting: Physical Therapy

## 2018-04-11 ENCOUNTER — Encounter: Payer: Self-pay | Admitting: Physical Therapy

## 2018-04-11 DIAGNOSIS — R2681 Unsteadiness on feet: Secondary | ICD-10-CM

## 2018-04-11 DIAGNOSIS — R4701 Aphasia: Secondary | ICD-10-CM

## 2018-04-11 DIAGNOSIS — M6281 Muscle weakness (generalized): Secondary | ICD-10-CM | POA: Diagnosis not present

## 2018-04-11 DIAGNOSIS — I609 Nontraumatic subarachnoid hemorrhage, unspecified: Secondary | ICD-10-CM

## 2018-04-11 DIAGNOSIS — S065XAA Traumatic subdural hemorrhage with loss of consciousness status unknown, initial encounter: Secondary | ICD-10-CM

## 2018-04-11 DIAGNOSIS — R414 Neurologic neglect syndrome: Secondary | ICD-10-CM

## 2018-04-11 DIAGNOSIS — R278 Other lack of coordination: Secondary | ICD-10-CM

## 2018-04-11 DIAGNOSIS — S065X9A Traumatic subdural hemorrhage with loss of consciousness of unspecified duration, initial encounter: Secondary | ICD-10-CM

## 2018-04-11 NOTE — Therapy (Signed)
[place] MAIN [facility] SERVICES [address] [place], [place], [address] Phone: [phone]   Fax:  [phone]  Physical Therapy Treatment  Patient Details  Name: James [name] MRN: [id number] Date of Birth: 03-May-1941 Referring Provider: shah   Encounter Date: 04/11/2018  PT End of Session - 04/11/18 0806    Visit Number  14    Number of Visits  25    Date for PT Re-Evaluation  05/21/18    Authorization Type  4/10 starting on 02/26/18 progress note    PT Start Time  0803    PT Stop Time  0845    PT Time Calculation (min)  42 min    Equipment Utilized During Treatment  Gait belt    Activity Tolerance  Patient tolerated treatment well    Behavior During Therapy  [facility] for tasks assessed/performed       Past Medical History:  Diagnosis Date  . Alcohol abuse, in remission   . Atrial fibrillation ([place])   . Bilateral renal masses   . Closed right ankle fracture   . 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 Gait disorder   . Gout   . Hypertension   . Seizure disorder ([place])   . 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     Past Surgical History:  Procedure Laterality Date  . CRANIECTOMY FOR DEPRESSED SKULL FRACTURE  2009   with MRSA infection  . CRANIOTOMY     X 2  . HERNIA REPAIR     during childhood  . ORIF ANKLE FRACTURE Left 2012    There were no vitals filed for this visit.  Subjective Assessment - 04/11/18 0806    Subjective  Pt reports that he is doing well today. No specific questions or concerns. Denies pain and no external signs of discomfort. Wife denies any health changes since last visit.    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  He suffered another fall in 2017.  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    Patient  Stated Goals  Patient wants to have a safer gait and not stagger or feel off balance.     Currently in Pain?  No/denies    Multiple Pain Sites  No       Treatment: Neuromuscular training:  Gait out in hallway, CGA for all activities with VCs for maintaining gait speed and step length with activities: -Horizontal head turns x160 ft, calling out cards as walking to give point of focus -Vertical head turns x160 ft -Direction changes x80 ft, some difficulty with changing directions quickly and beginning to walk backwards -Speed changes x160 ft, some difficulty with slower gait speed and maintaining balance but good increase in gait speed  -Dual task with alternating arm and leg lifts x40 ft x2 laps, demonstrated increased difficulty with coordinating leg and arm lift and required increased time, demonstration, and mod VCs for proper sequencing   Seated LAQ x 15 x 2   Seated hip flex x 15 x 2   Marching in hooklying x 15 x 2,  with TA contraction  SAQ x 15 x 2 , 3 lbs and 3 sec hold  SLR X 15 BLE , intervals x 2   sidellying hip abd x 15 x  2 BLE  Standing hip abd x 15 BLE  sidelying clam x 15 with GTB x 15 x 2   Bridges x 15 with 3 sec hold x 15 x 2.  Heel raises x 15 x 2  CGA and  mod verbal cues used throughout with increased in postural sway and LOB most seen with narrow base of support  Patient performs intermediate level exercises without pain behaviors and needs verbal cuing for postural alignment and head positioning.                        PT Education - 04/11/18 0806    Education Details  HEP    Person(s) Educated  Patient    Methods  Explanation;Demonstration;Tactile cues;Verbal cues    Comprehension  Verbalized understanding;Returned demonstration       PT Short Term Goals - 03/29/18 0820      PT SHORT TERM GOAL #1   Title  Patient will be independent in home exercise program to improve strength/mobility for better functional independence  with ADLs.    Time  6    Period  Weeks    Status  Partially Met    Target Date  04/09/18      PT SHORT TERM GOAL #2   Title  Patient (> 60 years old) will complete five times sit to stand test in < 15 seconds indicating an increased LE strength and improved balance.    Baseline  19.15 sec and needs CGA to min assist to keep from falling, 03/29/18=16.82 sec    Time  6    Period  Weeks    Status  Partially Met    Target Date  04/09/18        PT Long Term Goals - 03/29/18 0823      PT LONG TERM GOAL #1   Title  Patient will increase Berg Balance score by > 6 points to demonstrate decreased fall risk during functional activities.    Baseline  36/56 Merrilee Jansky,;  03/29/18 Berg = 36/52     Time  12    Period  Weeks    Status  Partially Met    Target Date  05/21/18      PT LONG TERM GOAL #2   Title  Patient will reduce timed up and go to <11 seconds to reduce fall risk and demonstrate improved transfer/gait ability.    Baseline  15.18 sec with poor safety judgement and uncontrolled sit, 03/29/18=13.02 sec    Time  12    Period  Weeks    Status  Partially Met    Target Date  05/21/18      PT LONG TERM GOAL #3   Title  Patient will increase dynamic gait index score to >19/24 as to demonstrate reduced fall risk and improved dynamic gait balance for better safety with community/home ambulation.     Baseline  16/24, 03/29/18=16/25    Time  12    Period  Weeks    Status  Partially Met    Target Date  05/21/18            Plan - 04/11/18 0807    Clinical Impression Statement  Pt responded well to all interventions today.  Focus on core and hip strengthening in order to improve balance and ambulation.  Pt attempted hip ABD with weight and single leg bridges against gravity, but was unable to perform against resistance of gravity secondary to weakness.  Pt was  able to complete core exercises, requiring cueing for proper form and hip alignment during seated and supine core activities. Pt would  continue to benefit from skilled therapy services in order to improve LE and core strength, gait and functional mobility in order to decrease risk of falls and improve independence with mobility.    Rehab Potential  Good    PT Frequency  2x / week    PT Duration  12 weeks    PT Treatment/Interventions  Therapeutic activities;Therapeutic exercise;Balance training;Neuromuscular re-education;Functional mobility training;Gait training;Manual techniques    PT Next Visit Plan  balance and HEP    PT Home Exercise Plan  heel raises, bridges    Consulted and Agree with Plan of Care  Patient;Family member/caregiver       Patient will benefit from skilled therapeutic intervention in order to improve the following deficits and impairments:  Abnormal gait, Decreased coordination, Decreased activity tolerance, Decreased endurance, Decreased strength, Decreased balance, Decreased safety awareness, Difficulty walking  Visit Diagnosis: Muscle weakness (generalized)  Other lack of coordination  Neurologic neglect syndrome  Unsteadiness on feet  SDH (subdural hematoma) (HCC)  SAH (subarachnoid hemorrhage) (HCC)  Global aphasia     Problem List Patient Active Problem List   Diagnosis Date Noted  . Sleep disturbance   . PAF (paroxysmal atrial fibrillation) ([place])   . Recurrent UTI   . Hypokalemia   . Hyponatremia   . Abnormal urine   . 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 Benign essential HTN   . Urinary retention   . History of gout   . Global aphasia   . SAH (subarachnoid hemorrhage) ([place])   . SDH (subdural hematoma) ([place])   . Lymphocytosis   . Subdural hemorrhage following injury ([place]) 05/12/2016    Alanson Puls , PT DPT 04/11/2018, 8:10 AM  [place] MAIN [facility] SERVICES [address] [place], [place], [address] Phone: [phone]   Fax:  [phone]  Name: James [name] MRN: [id number] Date of Birth:  06-20-1941

## 2018-04-12 ENCOUNTER — Encounter: Payer: Self-pay | Admitting: Occupational Therapy

## 2018-04-16 ENCOUNTER — Encounter: Payer: Self-pay | Admitting: Physical Therapy

## 2018-04-16 ENCOUNTER — Ambulatory Visit: Payer: PPO | Admitting: Physical Therapy

## 2018-04-16 ENCOUNTER — Ambulatory Visit: Payer: PPO | Admitting: Occupational Therapy

## 2018-04-16 DIAGNOSIS — R414 Neurologic neglect syndrome: Secondary | ICD-10-CM

## 2018-04-16 DIAGNOSIS — M6281 Muscle weakness (generalized): Secondary | ICD-10-CM

## 2018-04-16 DIAGNOSIS — S065XAA Traumatic subdural hemorrhage with loss of consciousness status unknown, initial encounter: Secondary | ICD-10-CM

## 2018-04-16 DIAGNOSIS — R278 Other lack of coordination: Secondary | ICD-10-CM

## 2018-04-16 DIAGNOSIS — R262 Difficulty in walking, not elsewhere classified: Secondary | ICD-10-CM

## 2018-04-16 DIAGNOSIS — R2681 Unsteadiness on feet: Secondary | ICD-10-CM

## 2018-04-16 DIAGNOSIS — S065X9A Traumatic subdural hemorrhage with loss of consciousness of unspecified duration, initial encounter: Secondary | ICD-10-CM

## 2018-04-16 DIAGNOSIS — I609 Nontraumatic subarachnoid hemorrhage, unspecified: Secondary | ICD-10-CM

## 2018-04-16 NOTE — Therapy (Addendum)
[place] MAIN [facility] SERVICES [address] [place], [place], [address] Phone: [phone]   Fax:  [phone]  Physical Therapy Treatment  Patient Details  Name: James [name] MRN: [id number] Date of Birth: 10-24-1940 Referring Provider: shah   Encounter Date: 04/16/2018  PT End of Session - 04/16/18 0810    Visit Number  15    Number of Visits  25    Date for PT Re-Evaluation  05/21/18    Authorization Type  5/10 starting on 02/26/18 progress note    PT Start Time  0802    PT Stop Time  0842    PT Time Calculation (min)  40 min    Equipment Utilized During Treatment  Gait belt    Activity Tolerance  Patient tolerated treatment well    Behavior During Therapy  [facility] for tasks assessed/performed       Past Medical History:  Diagnosis Date  . Alcohol abuse, in remission   . Atrial fibrillation ([place])   . Bilateral renal masses   . Closed right ankle fracture   . 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 Gait disorder   . Gout   . Hypertension   . Seizure disorder ([place])   . 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     Past Surgical History:  Procedure Laterality Date  . CRANIECTOMY FOR DEPRESSED SKULL FRACTURE  2009   with MRSA infection  . CRANIOTOMY     X 2  . HERNIA REPAIR     during childhood  . ORIF ANKLE FRACTURE Left 2012    There were no vitals filed for this visit.  Subjective Assessment - 04/16/18 0809    Subjective  Pt reports that he is doing well today. No specific questions or concerns. Denies pain and no external signs of discomfort. Wife denies any health changes since last visit.    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  He suffered another fall in 2017.  Since 2005 he has  suffered from expressive aphasia.    Patient Stated Goals  Patient wants to have a safer gait and not stagger or feel off balance.     Currently in Pain?  No/denies    Multiple Pain Sites  No       Treatment:  Octane fitness L 6 x 5 mins   Lunges into bosu ball left and right with HHA on parallel bars x 10 x 2   Tandem stance, eyes closed, x10 sec holds with each foot in rear x2 bouts each foot, supervision for safety, 50%  UE support -VCs for proper technique and positioning for each exercise   Rockerboard Lateral weight shift x10 reps each direction, 50 %  UE support, CGA for safety with VCs to control the board tap in each direction and not letting it hit too hard   AP weight shift x10 reps, 50% UE support, CGA for safety with VCs to utilize ankles to transfer weight over toes and back over heels without  leaning the hips  Airex pad head turns  x2 min, CGA for safety, demonstrated difficulty with loss of balance posterior   Airex pad, weight shifting with cone pass x2 min, supervision for safety  with varying directions , VCs for utilizing both hands and minimizing UE support  Agility ladder :Step out, out, in, in forwards x2 laps, backwards x2 laps, CGA for safety, VCs to take big enough steps and to try to increase speed to work on coordination   Side stepping x1 lap each direction, CGA for safety, VCs for taking a big enough step to get both feet into the square   Gait out in hallway, CGA for all activities with VCs for maintaining gait speed and step length with activities: Horizontal head turns Vertical head turns  Direction changes  some difficulty with changing directions quickly and beginning to walk backwards Speed changes some difficulty with slower gait speed and maintaining balance but good increase in gait speed  Dual task with alternating arm  Lifts and head turns  demonstrated increased difficulty with coordinating  arm lift and required increased time,  demonstration, and mod VCs for proper sequencing   Airex pad trunk rotation   x2 min, CGA for safety, demonstrated difficulty with trunk rotation and keeping arms extended  Step over hurdle fwd/ bwd, side to side , CGA for safety, VCs to take big enough steps and to try to increase speed to work on coordination                          PT Education - 04/16/18 0810    Education Details HEP, balance    Person(s) Educated  Patient    Methods  Explanation;Demonstration;Tactile cues;Verbal cues    Comprehension  Returned demonstration;Need further instruction;Tactile cues required       PT Short Term Goals - 03/29/18 0820      PT SHORT TERM GOAL #1   Title  Patient will be independent in home exercise program to improve strength/mobility for better functional independence with ADLs.    Time  6    Period  Weeks    Status  Partially Met    Target Date  04/09/18      PT SHORT TERM GOAL #2   Title  Patient (> 67 years old) will complete five times sit to stand test in < 15 seconds indicating an increased LE strength and improved balance.    Baseline  19.15 sec and needs CGA to min assist to keep from falling, 03/29/18=16.82 sec    Time  6    Period  Weeks    Status  Partially Met    Target Date  04/09/18        PT Long Term Goals - 03/29/18 0823      PT LONG TERM GOAL #1   Title  Patient will increase Berg Balance score by > 6 points to demonstrate decreased fall risk during functional activities.    Baseline  36/56 Merrilee Jansky,;  03/29/18 Berg = 36/52     Time  12    Period  Weeks    Status  Partially Met    Target Date  05/21/18      PT LONG TERM GOAL #2   Title  Patient will reduce timed up and go to <11 seconds to reduce fall risk and demonstrate improved transfer/gait ability.    Baseline  15.18 sec with poor safety judgement and uncontrolled sit, 03/29/18=13.02 sec    Time  12    Period  Weeks    Status  Partially Met    Target Date  05/21/18      PT LONG  TERM GOAL #  3   Title  Patient will increase dynamic gait index score to >19/24 as to demonstrate reduced fall risk and improved dynamic gait balance for better safety with community/home ambulation.     Baseline  16/24, 03/29/18=16/25    Time  12    Period  Weeks    Status  Partially Met    Target Date  05/21/18            Plan - 04/16/18 0811    Clinical Impression Statement  Pt presents with unsteadiness on uneven surfaces and fatigues with therapeutic exercises. Patient needs assist with instruction for balance with side stepping on uneven surfaces,  and needs CGA assist with balance activities. Patient demonstrates difficulty with side stepping  and navigating small spaces with decreased base of support and increased challenges for LE.  Patient tolerated all interventions well this date and will benefit from continued skilled PT interventions to improve strength and balance and decrease risk of falling    Rehab Potential  Good    PT Frequency  2x / week    PT Duration  12 weeks    PT Treatment/Interventions  Therapeutic activities;Therapeutic exercise;Balance training;Neuromuscular re-education;Functional mobility training;Gait training;Manual techniques    PT Next Visit Plan  balance and HEP    PT Home Exercise Plan  heel raises, bridges    Consulted and Agree with Plan of Care  Patient;Family member/caregiver       Patient will benefit from skilled therapeutic intervention in order to improve the following deficits and impairments:  Abnormal gait, Decreased coordination, Decreased activity tolerance, Decreased endurance, Decreased strength, Decreased balance, Decreased safety awareness, Difficulty walking  Visit Diagnosis: Muscle weakness (generalized)  Other lack of coordination  Neurologic neglect syndrome  Unsteadiness on feet  SDH (subdural hematoma) (HCC)  SAH (subarachnoid hemorrhage) (HCC)  Difficulty in walking, not elsewhere classified     Problem  List Patient Active Problem List   Diagnosis Date Noted  . Sleep disturbance   . PAF (paroxysmal atrial fibrillation) ([place])   . Recurrent UTI   . Hypokalemia   . Hyponatremia   . Abnormal urine   . 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 Benign essential HTN   . Urinary retention   . History of gout   . Global aphasia   . SAH (subarachnoid hemorrhage) ([place])   . SDH (subdural hematoma) ([place])   . Lymphocytosis   . Subdural hemorrhage following injury ([place]) 05/12/2016    Alanson Puls, PT DPT 04/16/2018, 8:12 AM  [place] MAIN [facility] SERVICES [address] [place], [place], [address] Phone: [phone]   Fax:  [phone]  Name: James [name] MRN: [id number] Date of Birth: [birth year]

## 2018-04-18 ENCOUNTER — Ambulatory Visit: Payer: PPO | Admitting: Physical Therapy

## 2018-04-18 ENCOUNTER — Encounter: Payer: Self-pay | Admitting: Physical Therapy

## 2018-04-18 ENCOUNTER — Ambulatory Visit: Payer: PPO | Admitting: Occupational Therapy

## 2018-04-18 DIAGNOSIS — M6281 Muscle weakness (generalized): Secondary | ICD-10-CM

## 2018-04-18 DIAGNOSIS — I609 Nontraumatic subarachnoid hemorrhage, unspecified: Secondary | ICD-10-CM

## 2018-04-18 DIAGNOSIS — R414 Neurologic neglect syndrome: Secondary | ICD-10-CM

## 2018-04-18 DIAGNOSIS — R262 Difficulty in walking, not elsewhere classified: Secondary | ICD-10-CM

## 2018-04-18 DIAGNOSIS — R278 Other lack of coordination: Secondary | ICD-10-CM

## 2018-04-18 DIAGNOSIS — S065X9A Traumatic subdural hemorrhage with loss of consciousness of unspecified duration, initial encounter: Secondary | ICD-10-CM

## 2018-04-18 DIAGNOSIS — R2681 Unsteadiness on feet: Secondary | ICD-10-CM

## 2018-04-18 DIAGNOSIS — S065XAA Traumatic subdural hemorrhage with loss of consciousness status unknown, initial encounter: Secondary | ICD-10-CM

## 2018-04-18 NOTE — Therapy (Addendum)
[place] MAIN [facility] SERVICES [address] [place], [place], [address] Phone: [phone]   Fax:  [phone]  Physical Therapy Treatment  Patient Details  Name: James [name] MRN: [id number] Date of Birth: 24-Oct-1940 Referring Provider: shah   Encounter Date: 04/18/2018  PT End of Session - 04/18/18 0826    Visit Number  16    Number of Visits  25    Date for PT Re-Evaluation  05/21/18    Authorization Type  5/10 starting on 02/26/18 progress note    PT Start Time  0802    PT Stop Time  0842    PT Time Calculation (min)  40 min    Equipment Utilized During Treatment  Gait belt    Activity Tolerance  Patient tolerated treatment well    Behavior During Therapy  [facility] for tasks assessed/performed       Past Medical History:  Diagnosis Date  . Alcohol abuse, in remission   . Atrial fibrillation ([place])   . Bilateral renal masses   . Closed right ankle fracture   . 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 Gait disorder   . Gout   . Hypertension   . Seizure disorder ([place])   . 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     Past Surgical History:  Procedure Laterality Date  . CRANIECTOMY FOR DEPRESSED SKULL FRACTURE  2009   with MRSA infection  . CRANIOTOMY     X 2  . HERNIA REPAIR     during childhood  . ORIF ANKLE FRACTURE Left 2012    There were no vitals filed for this visit.  Subjective Assessment - 04/18/18 0825    Subjective  Pt reports that he is doing well today. No specific questions or concerns. Denies pain and no external signs of discomfort. Wife denies any health changes since last visit.    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  He suffered another fall in 2017.  Since 2005 he has  suffered from expressive aphasia.    Patient Stated Goals  Patient wants to have a safer gait and not stagger or feel off balance.     Currently in Pain?  No/denies    Multiple Pain Sites  No        Treatment:  Rocker board : fwd/bwd tapping softly, side to side tapping for lateral weight shifts; 50% UE assist  Tandem stand flat surface  with head turns x 2 minutes, CGA and cues for posture  Stepping onto AIREX, then on to 4 inch step followed by stepping down onto AIREX and then level surface in //bars x15.Pt required 75% UE assist, and performance improved with each repetition   bosu ball lunge  Side step and back, x10 bilaterally; CGA  Eccentric step downs x 10 BLE; cues to not put too much weight bearing on UE's  Squats x 10 with 5 sec hold, cues to maintain erect position  Heel raises x 10 x 2 ; cues to not rock forward  Step-ups to 6" step x 10 bilateral; cues for getting entire foot on step  Quantum leg press 100 # x 20 x 3; cues for not snapping LE in extension and performing  slowly  Matrix 22. 5 lbs and side stepping left and side stepping right x 5    CGA and mod verbal cues used throughout with increased in postural sway and LOB most seen with narrow base of support and while on uneven surfaces. Continues to have balance deficits typical with diagnosis. Patient performs intermediate level exercises without pain behaviors and needs verbal cuing for postural alignment                          PT Education - 04/18/18 0826    Education Details   HEP, balance    Person(s) Educated  Patient    Methods  Explanation;Demonstration;Tactile cues    Comprehension  Verbalized understanding;Need further instruction;Returned demonstration       PT Short Term Goals - 03/29/18 0820      PT SHORT TERM GOAL #1   Title  Patient will be independent in home exercise program to improve strength/mobility for better functional independence with  ADLs.    Time  6    Period  Weeks    Status  Partially Met    Target Date  04/09/18      PT SHORT TERM GOAL #2   Title  Patient (> 42 years old) will complete five times sit to stand test in < 15 seconds indicating an increased LE strength and improved balance.    Baseline  19.15 sec and needs CGA to min assist to keep from falling, 03/29/18=16.82 sec    Time  6    Period  Weeks    Status  Partially Met    Target Date  04/09/18        PT Long Term Goals - 03/29/18 0823      PT LONG TERM GOAL #1   Title  Patient will increase Berg Balance score by > 6 points to demonstrate decreased fall risk during functional activities.    Baseline  36/56 Merrilee Jansky,;  03/29/18 Berg = 36/52     Time  12    Period  Weeks    Status  Partially Met    Target Date  05/21/18      PT LONG TERM GOAL #2   Title  Patient will reduce timed up and go to <11 seconds to reduce fall risk and demonstrate improved transfer/gait ability.    Baseline  15.18 sec with poor safety judgement and uncontrolled sit, 03/29/18=13.02 sec    Time  12    Period  Weeks    Status  Partially Met    Target Date  05/21/18      PT LONG TERM GOAL #3   Title  Patient will increase dynamic gait index score to >19/24 as to demonstrate reduced fall risk and improved dynamic gait balance for better safety with community/home ambulation.     Baseline  16/24, 03/29/18=16/25    Time  12    Period  Weeks    Status  Partially Met    Target Date  05/21/18            Plan - 04/18/18 0827    Clinical Impression Statement  Patient demonstrates wide base of support and decreased dynamic standing balance with advanced challenges though improvements seen in  ability to maintain center of gravity during intermediate challenged dynamic standing balance activities. Patient will continue to benefit from skilled therapy in order to improve dynamic standing balance and endurance.    Rehab Potential  Good  PT Frequency  2x / week    PT Duration  12  weeks    PT Treatment/Interventions  Therapeutic activities;Therapeutic exercise;Balance training;Neuromuscular re-education;Functional mobility training;Gait training;Manual techniques    PT Next Visit Plan  balance and HEP    PT Home Exercise Plan  heel raises, bridges    Consulted and Agree with Plan of Care  Patient;Family member/caregiver       Patient will benefit from skilled therapeutic intervention in order to improve the following deficits and impairments:  Abnormal gait, Decreased coordination, Decreased activity tolerance, Decreased endurance, Decreased strength, Decreased balance, Decreased safety awareness, Difficulty walking  Visit Diagnosis: Muscle weakness (generalized)  Other lack of coordination  Neurologic neglect syndrome  Unsteadiness on feet  SDH (subdural hematoma) (HCC)  SAH (subarachnoid hemorrhage) (HCC)  Difficulty in walking, not elsewhere classified     Problem List Patient Active Problem List   Diagnosis Date Noted  . Sleep disturbance   . PAF (paroxysmal atrial fibrillation) ([place])   . Recurrent UTI   . Hypokalemia   . Hyponatremia   . Abnormal urine   . 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 Benign essential HTN   . Urinary retention   . History of gout   . Global aphasia   . SAH (subarachnoid hemorrhage) ([place])   . SDH (subdural hematoma) ([place])   . Lymphocytosis   . Subdural hemorrhage following injury ([place]) 05/12/2016    Alanson Puls, PT DPT 04/18/2018, 8:29 AM  [place] MAIN [facility] SERVICES [address] [place], [place], [address] Phone: [phone]   Fax:  [phone]  Name: James [name] MRN: [id number] Date of Birth: [birth year]

## 2018-04-20 NOTE — Therapy (Signed)
[place] MAIN [facility] SERVICES [address] [place], [place], [address] Phone: [phone]   Fax:  [phone]  Occupational Therapy Treatment  Patient Details  Name: Lynn Sissel MRN: [id number] Date of Birth: 11-24-1940 No data recorded  Encounter Date: 04/09/2018  OT End of Session - 04/20/18 1625    Visit Number  29    Number of Visits  28    Date for OT Re-Evaluation  06/08/18    Authorization Type  Medicare visit 7/10    OT Start Time  0930    OT Stop Time  1015    OT Time Calculation (min)  45 min    Activity Tolerance  Patient tolerated treatment well    Behavior During Therapy  [facility] for tasks assessed/performed       Past Medical History:  Diagnosis Date  . Alcohol abuse, in remission   . Atrial fibrillation ([place])   . Bilateral renal masses   . Closed right ankle fracture   . 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 Gait disorder   . Gout   . Hypertension   . Seizure disorder ([place])   . 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     Past Surgical History:  Procedure Laterality Date  . CRANIECTOMY FOR DEPRESSED SKULL FRACTURE  2009   with MRSA infection  . CRANIOTOMY     X 2  . HERNIA REPAIR     during childhood  . ORIF ANKLE FRACTURE Left 2012    There were no vitals filed for this visit.  Subjective Assessment - 04/20/18 1624    Subjective   Patient indicating he is doing well and worked on exercises at home over the weekend.     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  He suffered another fall in 2017.  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     Patient Stated Goals  to be able to use the right hand functionally    Currently in Pain?  No/denies    Multiple Pain Sites  No         Patient seen for finger  prehension/strengthening with interlocking pieces which are  inch in size, various shapes with cues for prehension patterns and stabilizing to place together.  Attempts at manipulation of  inch Small pegs difficulty to pick up and turn to place into grid however, can remove from grid when items are in a vertical position.  Knotting and unknotting exercises with emphasis on right hand to lead task, patient able to complete if knots are lightly tied however has increased difficulty if tightened.                    OT Education - 04/20/18 1625    Education provided  Yes    Education Details  manipulation of small objects and prehension patterns    Person(s) Educated  Patient    Methods  Explanation;Demonstration;Verbal cues    Comprehension  Verbal cues required;Returned demonstration;Verbalized understanding          OT Long Term Goals - 03/24/18 1633      OT LONG TERM GOAL #1   Title  Patient will improve right hand function to hold utensil in right hand for self feeding with  modified independence.     Baseline  unable to hold utensil at eval, now able to hold utensils and self feed with minimal spillage.    Time  12    Period  Weeks    Status  Achieved      OT LONG TERM GOAL #2   Title  Pt will increase hand strength in right by 5# to be able to cut food with modified independence.    Baseline  unable at eval, able to cut meat with occasional assist at 30 visit.    Time  12    Period  Weeks    Status  Achieved      OT LONG TERM GOAL #3   Title  Patient will demonstrate increased awareness and strategies to attend to right side to hold items without dropping items.    Baseline  drops items frequently, 30 visit-patient dropping objects less frequently and is more aware when holding objects in hand    Time  12    Period  Weeks    Status  On-going      OT LONG TERM GOAL #4   Title  Patient will demonstrate independence in home exercise program for ROM, strength  and coordination of right hand.    Baseline  no current program, modified independent with current program at 30 visit but continuing to add exercises.    Time  6    Period  Weeks    Status  On-going      OT LONG TERM GOAL #5   Title  Patient will demonstrate the ability to obtain items from right pocket 90% of the time.    Baseline  difficulty getting keys or coins out of right pocket, 30 visit update-able to demonstrate 75% of the time    Time  12    Period  Weeks    Status  On-going      OT LONG TERM GOAL #6   Title  Patient will demonstrate pouring a drink with the right hand with attention to right side and not knocking the cup over and spilling.      Baseline  patient often does not realize the cup is on his right side and knocks it over when attempting to get a drink    Time  12    Period  Weeks    Status  On-going            Plan - 04/20/18 1626    Clinical Impression Statement  Patient demonstrates improvements in right finger strength and prehension skills with increased ability to pick up, hold, stabilize and manipulate objects less than 1 inch in size. He does require verbal cues for prehension patterns and occasional tactile or guiding cues for movements. Continue to encourage exercises at home on a daily basis to improve carryover.      Occupational Profile and client history currently impacting functional performance  repeated falls, TBI, expressive aphasia, limited motion in right hand, lack of coordination from previous CVA    Occupational performance deficits (Please refer to evaluation for details):  ADL's;IADL's;Leisure    Rehab Potential  Good    Current Impairments/barriers affecting progress:  expressive aphasia, falls, neglect    OT Frequency  2x / week    OT Duration  12 weeks    OT Treatment/Interventions  Self-care/ADL training;Visual/perceptual remediation/compensation;DME and/or AE instruction;Patient/family education;Moist Heat;Therapeutic  exercise;Manual Therapy;Therapeutic activities;Neuromuscular education    Consulted and Agree with Plan of Care  Patient  Patient will benefit from skilled therapeutic intervention in order to improve the following deficits and impairments:  Impaired vision/preception, Decreased coordination, Decreased safety awareness, Impaired UE functional use, Decreased strength  Visit Diagnosis: Muscle weakness (generalized)  Other lack of coordination  Neurologic neglect syndrome    Problem List Patient Active Problem List   Diagnosis Date Noted  . Sleep disturbance   . PAF (paroxysmal atrial fibrillation) ([place])   . Recurrent UTI   . Hypokalemia   . Hyponatremia   . Abnormal urine   . 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 Benign essential HTN   . Urinary retention   . History of gout   . Global aphasia   . SAH (subarachnoid hemorrhage) ([place])   . SDH (subdural hematoma) ([place])   . Lymphocytosis   . Subdural hemorrhage following injury [facility]) 05/12/2016   Reygan Heagle T Cheris Tweten, OTR/L, CLT  Abisai Coble 04/20/2018, 4:29 PM  [place] MAIN [facility] SERVICES [address] [place], [place], [address] Phone: [phone]   Fax:  [phone]  Name: Hershal Eriksson MRN: [id number] Date of Birth: 06/03/1941

## 2018-04-20 NOTE — Therapy (Signed)
[place] MAIN [facility] SERVICES [address] [place], [place], [address] Phone: [phone]   Fax:  [phone]  Occupational Therapy Treatment  Patient Details  Name: James [name] MRN: [id number] Date of Birth: [birth year] No data recorded  Encounter Date: 04/11/2018  OT End of Session - 04/20/18 1825    Visit Number  34    Number of Visits  8    Date for OT Re-Evaluation  06/08/18    Authorization Type  Medicare visit 8/10    OT Start Time  0846    OT Stop Time  0930    OT Time Calculation (min)  44 min    Activity Tolerance  Patient tolerated treatment well    Behavior During Therapy  [facility] for tasks assessed/performed       Past Medical History:  Diagnosis Date  . Alcohol abuse, in remission   . Atrial fibrillation ([place])   . Bilateral renal masses   . Closed right ankle fracture   . 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 Gait disorder   . Gout   . Hypertension   . Seizure disorder ([place])   . 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     Past Surgical History:  Procedure Laterality Date  . CRANIECTOMY FOR DEPRESSED SKULL FRACTURE  2009   with MRSA infection  . CRANIOTOMY     X 2  . HERNIA REPAIR     during childhood  . ORIF ANKLE FRACTURE Left 2012    There were no vitals filed for this visit.  Subjective Assessment - 04/20/18 1824    Subjective   Patient's wife reports patient got out his magnetic board and magnets for home program and worked on it over the weekend as well as handwriting.     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  He suffered another fall in 2017.  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     Patient Stated Goals  to be able to use the right hand functionally    Currently in Pain?  No/denies    Multiple Pain Sites  No         Patient seen for manipulation of 100 pegboard/Judy board with emphasis on turning and picking up from board (with difficulty) but is showing improvements, cues for oppositional movements to pick up between thumb and middle finger and using index finger to manipulate object. Then worked on [employer] of picking up and placing into next row, no turning involved.  Manipulation of small Magnets which are the same shape as push pins, working towards separating pieces with right hand and then reaching to place onto board.  Cues for techniques to separate magnets                   OT Education - 04/20/18 1825    Education provided  Yes    Education Details  manipulation of small objects and prehension patterns    Person(s) Educated  Patient    Methods  Explanation;Demonstration;Verbal cues    Comprehension  Verbal cues required;Returned demonstration;Verbalized understanding          OT Long Term Goals - 03/24/18 1633      OT LONG TERM GOAL #1   Title  Patient will improve right hand function to hold utensil in right hand for self feeding with modified independence.     Baseline  unable to hold utensil at eval, now able to hold utensils and self feed with minimal spillage.    Time  12    Period  Weeks    Status  Achieved      OT LONG TERM GOAL #2   Title  Pt will increase hand strength in right by 5# to be able to cut food with modified independence.    Baseline  unable at eval, able to cut meat with occasional assist at 30 visit.    Time  12    Period  Weeks    Status  Achieved      OT LONG TERM GOAL #3   Title  Patient will demonstrate increased awareness and strategies to attend to right side to hold items without dropping items.    Baseline  drops items frequently, 30 visit-patient dropping objects less frequently and is more aware when holding objects in hand    Time  12    Period  Weeks    Status  On-going      OT LONG  TERM GOAL #4   Title  Patient will demonstrate independence in home exercise program for ROM, strength and coordination of right hand.    Baseline  no current program, modified independent with current program at 30 visit but continuing to add exercises.    Time  6    Period  Weeks    Status  On-going      OT LONG TERM GOAL #5   Title  Patient will demonstrate the ability to obtain items from right pocket 90% of the time.    Baseline  difficulty getting keys or coins out of right pocket, 30 visit update-able to demonstrate 75% of the time    Time  12    Period  Weeks    Status  On-going      OT LONG TERM GOAL #6   Title  Patient will demonstrate pouring a drink with the right hand with attention to right side and not knocking the cup over and spilling.      Baseline  patient often does not realize the cup is on his right side and knocks it over when attempting to get a drink    Time  12    Period  Weeks    Status  On-going            Plan - 04/20/18 1826    Clinical Impression Statement  Patient continues to demonstrate limitations in right hand coordination, graded pressure and manipulation skills.  He has show improvements in all areas and continues to work on these skills to increase hand function to perform daily activities. Continue to encourage patient to work towards manipulation of smaller objects and translatory movements of the right hand.      Occupational Profile and client history currently impacting functional performance  repeated falls, TBI, expressive aphasia, limited motion in right hand, lack of coordination from previous CVA    Occupational performance deficits (Please refer to evaluation for details):  ADL's;IADL's;Leisure    Rehab Potential  Good    Current Impairments/barriers affecting progress:  expressive aphasia, falls, neglect    OT Frequency  2x / week    OT Duration  12 weeks    OT Treatment/Interventions  Self-care/ADL training;Visual/perceptual  remediation/compensation;DME and/or AE instruction;Patient/family education;Moist Heat;Therapeutic exercise;Manual Therapy;Therapeutic activities;Neuromuscular  education    Consulted and Agree with Plan of Care  Patient       Patient will benefit from skilled therapeutic intervention in order to improve the following deficits and impairments:  Impaired vision/preception, Decreased coordination, Decreased safety awareness, Impaired UE functional use, Decreased strength  Visit Diagnosis: Muscle weakness (generalized)  Other lack of coordination  Neurologic neglect syndrome    Problem List Patient Active Problem List   Diagnosis Date Noted  . Sleep disturbance   . PAF (paroxysmal atrial fibrillation) ([place])   . Recurrent UTI   . Hypokalemia   . Hyponatremia   . Abnormal urine   . 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 Benign essential HTN   . Urinary retention   . History of gout   . Global aphasia   . SAH (subarachnoid hemorrhage) ([place])   . SDH (subdural hematoma) ([place])   . Lymphocytosis   . Subdural hemorrhage following injury [facility]) 05/12/2016   Rachana Malesky T Noelie Renfrow, OTR/L, CLT  Agastya Meister 04/20/2018, 6:29 PM  [place] MAIN [facility] SERVICES [address] [place], [place], [address] Phone: [phone]   Fax:  [phone]  Name: James [name] MRN: [id number] Date of Birth: 10-10-1940

## 2018-04-21 ENCOUNTER — Encounter: Payer: Self-pay | Admitting: Occupational Therapy

## 2018-04-21 NOTE — Therapy (Addendum)
[place] MAIN [facility] SERVICES [address] [place], [place], [address] Phone: [phone]   Fax:  [phone]  Occupational Therapy Treatment/Progress Update Reporting period from 03/14/2018 to 04/18/2018  Patient Details  Name: James [name] MRN: [id number] Date of Birth: 03/10/1941 No data recorded  Encounter Date: 04/18/2018  OT End of Session - 04/22/18 0958    Visit Number  60    Number of Visits  57    Date for OT Re-Evaluation  06/08/18    Authorization Type  Medicare visit 10/10    OT Start Time  0845    OT Stop Time  0930    OT Time Calculation (min)  45 min    Activity Tolerance  Patient tolerated treatment well    Behavior During Therapy  [facility] for tasks assessed/performed       Past Medical History:  Diagnosis Date  . Alcohol abuse, in remission   . Atrial fibrillation ([place])   . Bilateral renal masses   . Closed right ankle fracture   . 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 Gait disorder   . Gout   . Hypertension   . Seizure disorder ([place])   . 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     Past Surgical History:  Procedure Laterality Date  . CRANIECTOMY FOR DEPRESSED SKULL FRACTURE  2009   with MRSA infection  . CRANIOTOMY     X 2  . HERNIA REPAIR     during childhood  . ORIF ANKLE FRACTURE Left 2012    There were no vitals filed for this visit.  Subjective Assessment - 04/22/18 0957    Subjective   Patient smiling and ready to get started today.     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  He suffered another fall in 2017.  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     Patient Stated Goals  to be able to use the right hand functionally    Currently in Pain?  No/denies    Multiple Pain Sites  No           Patient seen for handwriting skills for first and last name, attempts at printing and script with emphasis on legibility. Cues for tripod grasp and to move fingers closer to end of the pen. Multiple repetitions to fill one page. Patient seen for manipulation skills with turning and flipping objects of various sizes ([place] disc, large peg and pen), cues for  Use of thumb and middle finger to pick up objects and use index finger to turn objects.  Reaching task with placing and removing velcro squares from resistive board placed in elevation to encourage greater reach.                  OT Education - 04/22/18 0957    Education provided  Yes    Education Details  manipulation skills, handwriting    Person(s) Educated  Patient    Methods  Explanation;Demonstration;Verbal cues    Comprehension  Verbal cues required;Returned demonstration;Verbalized understanding          OT Long Term Goals - 04/22/18 0959      OT LONG TERM GOAL #1   Title  Patient will improve right hand function to hold utensil  in right hand for self feeding with modified independence.     Baseline  unable to hold utensil at eval, now able to hold utensils and self feed with minimal spillage.    Time  12    Period  Weeks    Status  Achieved      OT LONG TERM GOAL #2   Title  Pt will increase hand strength in right by 5# to be able to cut food with modified independence.    Baseline  unable at eval, able to cut meat with occasional assist at 30 visit.    Time  12    Period  Weeks    Status  Achieved      OT LONG TERM GOAL #3   Title  Patient will demonstrate increased awareness and strategies to attend to right side to hold items without dropping items.    Baseline  drops items frequently, 30 visit-patient dropping objects less frequently and is more aware when holding objects in hand    Time  12    Period  Weeks    Status  On-going      OT LONG TERM GOAL #4   Title  Patient will  demonstrate independence in home exercise program for ROM, strength and coordination of right hand.    Baseline  no current program, modified independent with current program at 30 visit but continuing to add exercises.    Time  6    Period  Weeks    Status  On-going      OT LONG TERM GOAL #5   Title  Patient will demonstrate the ability to obtain items from right pocket 90% of the time.    Baseline  difficulty getting keys or coins out of right pocket, 30 visit update-able to demonstrate 75% of the time    Time  12    Period  Weeks    Status  On-going      OT LONG TERM GOAL #6   Title  Patient will demonstrate pouring a drink with the right hand with attention to right side and not knocking the cup over and spilling.      Baseline  patient often does not realize the cup is on his right side and knocks it over when attempting to get a drink    Time  12    Period  Weeks    Status  On-going            Plan - 04/22/18 0958    Clinical Impression Statement  Patient continues to work on handwriting skills in the clinic and at home with focus on his name, first and last.  He demonstrate increased difficulty with printing letters and tend to write in script but legibility is still poor but improved since working on task.  He is able to demonstrate increased consistency with tripod grasp on pen, occasional cues to move fingers down on pen.  Patient working towards Mudlogger with isolated finger movements.  He continues to require cues but is now able to detect most times when he is performing incorrectly and works to correct.  Patient continues to progress and benefits from skilled OT to increase independence and safety in daily tasks.     Occupational Profile and client history currently impacting functional performance  repeated falls, TBI, expressive aphasia, limited motion in right hand, lack of coordination from previous CVA    Occupational performance deficits (Please  refer to evaluation for details):  ADL's;IADL's;Leisure    Rehab Potential  Good    Current Impairments/barriers affecting progress:  expressive aphasia, falls, neglect    OT Frequency  2x / week    OT Duration  12 weeks    OT Treatment/Interventions  Self-care/ADL training;Visual/perceptual remediation/compensation;DME and/or AE instruction;Patient/family education;Moist Heat;Therapeutic exercise;Manual Therapy;Therapeutic activities;Neuromuscular education    Consulted and Agree with Plan of Care  Patient       Patient will benefit from skilled therapeutic intervention in order to improve the following deficits and impairments:  Impaired vision/preception, Decreased coordination, Decreased safety awareness, Impaired UE functional use, Decreased strength  Visit Diagnosis: Muscle weakness (generalized)  Other lack of coordination  Neurologic neglect syndrome    Problem List Patient Active Problem List   Diagnosis Date Noted  . Sleep disturbance   . PAF (paroxysmal atrial fibrillation) ([place])   . Recurrent UTI   . Hypokalemia   . Hyponatremia   . Abnormal urine   . 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 Benign essential HTN   . Urinary retention   . History of gout   . Global aphasia   . SAH (subarachnoid hemorrhage) ([place])   . SDH (subdural hematoma) ([place])   . Lymphocytosis   . Subdural hemorrhage following injury ([place]) 05/12/2016   Toy Eisemann T Jayland Null, OTR/L, CLT  Charnel Giles 04/22/2018, 10:05 AM  [place] MAIN [facility] SERVICES [place], [place], [address] Phone: [phone]   Fax:  [phone]  Name: James [name] MRN: [id number] Date of Birth: 1941/01/05

## 2018-04-21 NOTE — Therapy (Signed)
[place] MAIN [facility] SERVICES [address] [place], [place], [address] Phone: [phone]   Fax:  [phone]  Occupational Therapy Treatment  Patient Details  Name: James [name] MRN: [id number] Date of Birth: 1941-06-23 No data recorded  Encounter Date: 04/16/2018  OT End of Session - 04/21/18 1827    Visit Number  69    Number of Visits  76    Date for OT Re-Evaluation  06/08/18    Authorization Type  Medicare visit 9/10    OT Start Time  0845    OT Stop Time  0928    OT Time Calculation (min)  43 min    Activity Tolerance  Patient tolerated treatment well    Behavior During Therapy  [facility] for tasks assessed/performed       Past Medical History:  Diagnosis Date  . Alcohol abuse, in remission   . Atrial fibrillation ([place])   . Bilateral renal masses   . Closed right ankle fracture   . 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 Gait disorder   . Gout   . Hypertension   . Seizure disorder ([place])   . 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     Past Surgical History:  Procedure Laterality Date  . CRANIECTOMY FOR DEPRESSED SKULL FRACTURE  2009   with MRSA infection  . CRANIOTOMY     X 2  . HERNIA REPAIR     during childhood  . ORIF ANKLE FRACTURE Left 2012    There were no vitals filed for this visit.  Subjective Assessment - 04/21/18 1825    Subjective   Patient indicating he did his homework over the weekend, worked on handwriting and picking up small items.     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  He suffered another fall in 2017.  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     Patient Stated Goals  to be able to use the right hand functionally    Currently in Pain?  No/denies    Multiple Pain Sites  No         Patient seen for fine motor coordination skills to pick up small objects from tabletop and sort and place into containers, cues for  Prehension skills using thumb and index and then thumb and middle finger.  Worked towards isolated finger movements with index to  Turn and manipulate objects. Improved performance with larger objects versus smaller.   Patient seen for separating magnets with right hand using one handed technique, cues for technique.                    OT Education - 04/21/18 1826    Education provided  Yes    Education Details  HEP, graded pressure, isolated movements    Person(s) Educated  Patient    Methods  Explanation;Demonstration;Verbal cues    Comprehension  Verbal cues required;Returned demonstration;Verbalized understanding          OT Long Term Goals - 03/24/18 1633      OT LONG TERM GOAL #1   Title  Patient will improve right hand function to hold utensil in right hand for self feeding with modified independence.     Baseline  unable to hold utensil at  eval, now able to hold utensils and self feed with minimal spillage.    Time  12    Period  Weeks    Status  Achieved      OT LONG TERM GOAL #2   Title  Pt will increase hand strength in right by 5# to be able to cut food with modified independence.    Baseline  unable at eval, able to cut meat with occasional assist at 30 visit.    Time  12    Period  Weeks    Status  Achieved      OT LONG TERM GOAL #3   Title  Patient will demonstrate increased awareness and strategies to attend to right side to hold items without dropping items.    Baseline  drops items frequently, 30 visit-patient dropping objects less frequently and is more aware when holding objects in hand    Time  12    Period  Weeks    Status  On-going      OT LONG TERM GOAL #4   Title  Patient will demonstrate independence in home exercise program for ROM, strength and coordination of right hand.    Baseline  no  current program, modified independent with current program at 30 visit but continuing to add exercises.    Time  6    Period  Weeks    Status  On-going      OT LONG TERM GOAL #5   Title  Patient will demonstrate the ability to obtain items from right pocket 90% of the time.    Baseline  difficulty getting keys or coins out of right pocket, 30 visit update-able to demonstrate 75% of the time    Time  12    Period  Weeks    Status  On-going      OT LONG TERM GOAL #6   Title  Patient will demonstrate pouring a drink with the right hand with attention to right side and not knocking the cup over and spilling.      Baseline  patient often does not realize the cup is on his right side and knocks it over when attempting to get a drink    Time  12    Period  Weeks    Status  On-going            Plan - 04/21/18 1828    Clinical Impression Statement  Patient continues to work towards improving manipulation skills of right hand, isolated finger movements and use of graded pressure.  He responds well to cues and repetition and occasional guiding but does admit to frustration with smaller objects.  Wife reports patient will self initiate exercises at home most times during the week.     Occupational Profile and client history currently impacting functional performance  repeated falls, TBI, expressive aphasia, limited motion in right hand, lack of coordination from previous CVA    Occupational performance deficits (Please refer to evaluation for details):  ADL's;IADL's;Leisure    Current Impairments/barriers affecting progress:  expressive aphasia, falls, neglect    OT Frequency  2x / week    OT Duration  12 weeks    OT Treatment/Interventions  Self-care/ADL training;Visual/perceptual remediation/compensation;DME and/or AE instruction;Patient/family education;Moist Heat;Therapeutic exercise;Manual Therapy;Therapeutic activities;Neuromuscular education    Consulted and Agree with Plan of Care   Patient       Patient will benefit from skilled therapeutic intervention in order to improve the following deficits and impairments:  Impaired vision/preception, Decreased  coordination, Decreased safety awareness, Impaired UE functional use, Decreased strength  Visit Diagnosis: Muscle weakness (generalized)  Other lack of coordination  Neurologic neglect syndrome    Problem List Patient Active Problem List   Diagnosis Date Noted  . Sleep disturbance   . PAF (paroxysmal atrial fibrillation) ([place])   . Recurrent UTI   . Hypokalemia   . Hyponatremia   . Abnormal urine   . 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 Benign essential HTN   . Urinary retention   . History of gout   . Global aphasia   . SAH (subarachnoid hemorrhage) ([place])   . SDH (subdural hematoma) ([place])   . Lymphocytosis   . Subdural hemorrhage following injury [facility]) 05/12/2016   Traxton Kolenda T Yue Flanigan, OTR/L, CLT  Doaa Kendzierski 04/21/2018, 6:37 PM  [place] MAIN [facility] SERVICES [address] [place], [place], [address] Phone: [phone]   Fax:  [phone]  Name: James [name] MRN: [id number] Date of Birth: 15-Jul-1941

## 2018-04-22 ENCOUNTER — Encounter: Payer: Self-pay | Admitting: Occupational Therapy

## 2018-04-23 ENCOUNTER — Ambulatory Visit: Payer: PPO | Admitting: Occupational Therapy

## 2018-04-23 ENCOUNTER — Encounter: Payer: Self-pay | Admitting: Physical Therapy

## 2018-04-23 ENCOUNTER — Ambulatory Visit: Payer: PPO | Admitting: Physical Therapy

## 2018-04-23 ENCOUNTER — Encounter: Payer: Self-pay | Admitting: Occupational Therapy

## 2018-04-23 DIAGNOSIS — R2681 Unsteadiness on feet: Secondary | ICD-10-CM

## 2018-04-23 DIAGNOSIS — R414 Neurologic neglect syndrome: Secondary | ICD-10-CM

## 2018-04-23 DIAGNOSIS — R278 Other lack of coordination: Secondary | ICD-10-CM

## 2018-04-23 DIAGNOSIS — S065X9A Traumatic subdural hemorrhage with loss of consciousness of unspecified duration, initial encounter: Secondary | ICD-10-CM

## 2018-04-23 DIAGNOSIS — M6281 Muscle weakness (generalized): Secondary | ICD-10-CM

## 2018-04-23 DIAGNOSIS — I609 Nontraumatic subarachnoid hemorrhage, unspecified: Secondary | ICD-10-CM

## 2018-04-23 DIAGNOSIS — R262 Difficulty in walking, not elsewhere classified: Secondary | ICD-10-CM

## 2018-04-23 DIAGNOSIS — S065XAA Traumatic subdural hemorrhage with loss of consciousness status unknown, initial encounter: Secondary | ICD-10-CM

## 2018-04-23 NOTE — Therapy (Signed)
[place] MAIN [facility] SERVICES [address] [place], [place], [address] Phone: [phone]   Fax:  [phone]  Physical Therapy Treatment  Patient Details  Name: James [name] MRN: [id number] Date of Birth: [birth year] Referring Provider (PT): shah   Encounter Date: 04/23/2018  PT End of Session - 04/23/18 0859    Visit Number  17    Number of Visits  25    Date for PT Re-Evaluation  05/21/18    Authorization Type  6/10 starting on 02/26/18 progress note    Equipment Utilized During Treatment  Gait belt    Activity Tolerance  Patient tolerated treatment well    Behavior During Therapy  [facility] for tasks assessed/performed       Past Medical History:  Diagnosis Date  . Alcohol abuse, in remission   . Atrial fibrillation ([place])   . Bilateral renal masses   . Closed right ankle fracture   . 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 Gait disorder   . Gout   . Hypertension   . Seizure disorder ([place])   . 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     Past Surgical History:  Procedure Laterality Date  . CRANIECTOMY FOR DEPRESSED SKULL FRACTURE  2009   with MRSA infection  . CRANIOTOMY     X 2  . HERNIA REPAIR     during childhood  . ORIF ANKLE FRACTURE Left 2012    There were no vitals filed for this visit.  Subjective Assessment - 04/23/18 0858    Subjective  Pt reports that he is doing well today. No specific questions or concerns. Denies pain and no external signs of discomfort. Wife denies any health changes since last visit.    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  He suffered another fall in 2017.  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    Patient Stated Goals  Patient wants to have  a safer gait and not stagger or feel off balance.     Currently in Pain?  No/denies    Multiple Pain Sites  No       Treatment:  Rocker board : fwd/bwd tapping softly, side to side tapping for lateral weight shifts; 50% UE assist  Side stepping in parallel bars with UE assist 50 % , CGA and cues for posture  Stepping onto AIREX, then on to 4 inch step followed by stepping down onto AIREX and then level surface in //bars x15.Pt required 75%UE assist, and performance improved with each repetition   bosu ball lunge Side step and back, x10 bilaterally; CGA, UE support and cues for lunging fwd  Eccentric step downs x 10 BLE; cues to not put too much weight bearing on UE's  3 way hip with RTB x 10 x 2  cues to maintain erect position  Tapping  to 6" step x 10 bilateral; cues for getting foot high enough  Quantum leg press 100 # x 20 x 3; cues for not snapping LE in extension and performing slowly  Matrix 22. 5 lbs and side stepping left and side stepping right x 5   CGA and mod verbal cues used throughout with increased in postural sway and  LOB most seen with narrow base of support and while on uneven surfaces. Continues to have balance deficits typical with diagnosis.                        PT Education - 04/23/18 0859    Education Details  saftey, HEP, balance    Person(s) Educated  Patient    Methods  Explanation    Comprehension  Verbalized understanding;Returned demonstration;Need further instruction       PT Short Term Goals - 03/29/18 0820      PT SHORT TERM GOAL #1   Title  Patient will be independent in home exercise program to improve strength/mobility for better functional independence with ADLs.    Time  6    Period  Weeks    Status  Partially Met    Target Date  04/09/18      PT SHORT TERM GOAL #2   Title  Patient (> 65 years old) will complete five times sit to stand test in < 15 seconds indicating an increased LE  strength and improved balance.    Baseline  19.15 sec and needs CGA to min assist to keep from falling, 03/29/18=16.82 sec    Time  6    Period  Weeks    Status  Partially Met    Target Date  04/09/18        PT Long Term Goals - 03/29/18 0823      PT LONG TERM GOAL #1   Title  Patient will increase Berg Balance score by > 6 points to demonstrate decreased fall risk during functional activities.    Baseline  36/56 Merrilee Jansky,;  03/29/18 Berg = 36/52     Time  12    Period  Weeks    Status  Partially Met    Target Date  05/21/18      PT LONG TERM GOAL #2   Title  Patient will reduce timed up and go to <11 seconds to reduce fall risk and demonstrate improved transfer/gait ability.    Baseline  15.18 sec with poor safety judgement and uncontrolled sit, 03/29/18=13.02 sec    Time  12    Period  Weeks    Status  Partially Met    Target Date  05/21/18      PT LONG TERM GOAL #3   Title  Patient will increase dynamic gait index score to >19/24 as to demonstrate reduced fall risk and improved dynamic gait balance for better safety with community/home ambulation.     Baseline  16/24, 03/29/18=16/25    Time  12    Period  Weeks    Status  Partially Met    Target Date  05/21/18            Plan - 04/23/18 0859    Clinical Impression Statement  Patient demonstrated improved coordination with increased repetition and practice. Initiation of new task requires verbal, visual, and demonstrative cueing to be performed with tactile correction. Decreased correction and cueing was needed as task was repeated. Patient able to perform open and closed chain exercises. Patient would benefit from additional skilled PT intervention to improve coordination and balance    Rehab Potential  Good    PT Frequency  2x / week    PT Duration  12 weeks    PT Treatment/Interventions  Therapeutic activities;Therapeutic exercise;Balance training;Neuromuscular re-education;Functional mobility training;Gait training;Manual  techniques    PT Next Visit Plan  balance and HEP  PT Home Exercise Plan  heel raises, bridges    Consulted and Agree with Plan of Care  Patient;Family member/caregiver       Patient will benefit from skilled therapeutic intervention in order to improve the following deficits and impairments:  Abnormal gait, Decreased coordination, Decreased activity tolerance, Decreased endurance, Decreased strength, Decreased balance, Decreased safety awareness, Difficulty walking  Visit Diagnosis: Muscle weakness (generalized)  Other lack of coordination  Neurologic neglect syndrome  Unsteadiness on feet  SDH (subdural hematoma) ([facility])  SAH (subarachnoid hemorrhage) ([facility])  Difficulty in walking, not elsewhere classified     Problem List Patient Active Problem List   Diagnosis Date Noted  . Sleep disturbance   . PAF (paroxysmal atrial fibrillation) ([place])   . Recurrent UTI   . Hypokalemia   . Hyponatremia   . Abnormal urine   . 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 Benign essential HTN   . Urinary retention   . History of gout   . Global aphasia   . SAH (subarachnoid hemorrhage) ([place])   . SDH (subdural hematoma) ([place])   . Lymphocytosis   . Subdural hemorrhage following injury ([place]) 05/12/2016    Alanson Puls, PT DPT 04/23/2018, 9:01 AM  [place] MAIN [facility] SERVICES [address] [place], [place], [address] Phone: [phone]   Fax:  [phone]  Name: [place] [name] MRN: [id number] Date of Birth: 01-Nov-1940

## 2018-04-23 NOTE — Therapy (Signed)
[place] MAIN [facility] SERVICES [address] [place], [place], [address] Phone: [phone]   Fax:  [phone]  Occupational Therapy Treatment  Patient Details  Name: [place] [name] MRN: [id number] Date of Birth: 1940-11-14 No data recorded  Encounter Date: 04/23/2018  OT End of Session - 04/23/18 1837    Visit Number  61    Number of Visits  28    Date for OT Re-Evaluation  06/08/18    Authorization Type  Medicare visit 1/10    OT Start Time  0930    OT Stop Time  1016    OT Time Calculation (min)  46 min    Activity Tolerance  Patient tolerated treatment well    Behavior During Therapy  [facility] for tasks assessed/performed       Past Medical History:  Diagnosis Date  . Alcohol abuse, in remission   . Atrial fibrillation ([place])   . Bilateral renal masses   . Closed right ankle fracture   . 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 Gait disorder   . Gout   . Hypertension   . Seizure disorder ([place])   . 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     Past Surgical History:  Procedure Laterality Date  . CRANIECTOMY FOR DEPRESSED SKULL FRACTURE  2009   with MRSA infection  . CRANIOTOMY     X 2  . HERNIA REPAIR     during childhood  . ORIF ANKLE FRACTURE Left 2012    There were no vitals filed for this visit.  Subjective Assessment - 04/23/18 1835    Subjective   Wife reports they are going to the beach tomorrow and will not be at appt for wednesday.  Asking for exercises to take to the beach to stay active and towards consistency.     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  He suffered another fall in 2017.  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     Patient Stated Goals  to be able to use the right hand functionally    Currently  in Pain?  No/denies    Multiple Pain Sites  No       Patient seen for manipulation of coins from tabletop with right hand, cues for prehension patterns and occasional guiding especially for tip prehension to pick up dimes.   Patient placing into resistive bank with reach encouraged and finger strength to push into container.   Handwriting for name in notebook, first and last name.   Graded pressure with regular styrofoam cup and cup with cuts to challenge grasp pressure.  Occasional cues to modulate pressure to keep form of cup.  Grip strength with 17# for 25 repetitions for sustained grip to pick up object with hand gripper and move to place items into a container.                     OT Education - 04/23/18 1836    Education provided  Yes    Education Details  manipulation of coins, HEP, written plan for vacation    Person(s) Educated  Patient    Methods  Explanation;Demonstration;Verbal cues    Comprehension  Verbal cues required;Returned demonstration;Verbalized understanding  OT Long Term Goals - 04/22/18 0959      OT LONG TERM GOAL #1   Title  Patient will improve right hand function to hold utensil in right hand for self feeding with modified independence.     Baseline  unable to hold utensil at eval, now able to hold utensils and self feed with minimal spillage.    Time  12    Period  Weeks    Status  Achieved      OT LONG TERM GOAL #2   Title  Pt will increase hand strength in right by 5# to be able to cut food with modified independence.    Baseline  unable at eval, able to cut meat with occasional assist at 30 visit.    Time  12    Period  Weeks    Status  Achieved      OT LONG TERM GOAL #3   Title  Patient will demonstrate increased awareness and strategies to attend to right side to hold items without dropping items.    Baseline  drops items frequently, 30 visit-patient dropping objects less frequently and is more aware when holding  objects in hand    Time  12    Period  Weeks    Status  On-going      OT LONG TERM GOAL #4   Title  Patient will demonstrate independence in home exercise program for ROM, strength and coordination of right hand.    Baseline  no current program, modified independent with current program at 30 visit but continuing to add exercises.    Time  6    Period  Weeks    Status  On-going      OT LONG TERM GOAL #5   Title  Patient will demonstrate the ability to obtain items from right pocket 90% of the time.    Baseline  difficulty getting keys or coins out of right pocket, 30 visit update-able to demonstrate 75% of the time    Time  12    Period  Weeks    Status  On-going      OT LONG TERM GOAL #6   Title  Patient will demonstrate pouring a drink with the right hand with attention to right side and not knocking the cup over and spilling.      Baseline  patient often does not realize the cup is on his right side and knocks it over when attempting to get a drink    Time  12    Period  Weeks    Status  On-going            Plan - 04/23/18 1837    Clinical Impression Statement  Patient brought in notebook of handwriting from home.  Legibility remains poor but has some samples that are improved.  Patient reminded to stop when his hand gets tired and come back to it later. Patient going to the beach with his wife, wriiten program issued for handwriting, manipulation of coins, magnets to separate and also place on refrigerator in standing, graded pressure on styrofoam cups and retrieving items from right pocket.  Patient demonstrates understanding.  Patient has difficulty with picking up coins from tabletop and maintaining prehension pinch on coins, especially on dimes.  Continue to work towards goals to improve right hand function for use in daily tasks.        Patient will benefit from skilled therapeutic intervention in order to improve the following deficits and  impairments:     Visit  Diagnosis: Muscle weakness (generalized)  Other lack of coordination  Neurologic neglect syndrome    Problem List Patient Active Problem List   Diagnosis Date Noted  . Sleep disturbance   . PAF (paroxysmal atrial fibrillation) ([place])   . Recurrent UTI   . Hypokalemia   . Hyponatremia   . Abnormal urine   . 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 Benign essential HTN   . Urinary retention   . History of gout   . Global aphasia   . SAH (subarachnoid hemorrhage) ([place])   . SDH (subdural hematoma) ([place])   . Lymphocytosis   . Subdural hemorrhage following injury ([place]) 05/12/2016   Deshanda Molitor T Cadience Bradfield, OTR/L, CLT  Twan Harkin 04/23/2018, 6:42 PM  [place] MAIN [facility] SERVICES [address] [place], [place], [address] Phone: [phone]   Fax:  [phone]  Name: James [name] MRN: [id number] Date of Birth: [birth year]

## 2018-04-25 ENCOUNTER — Ambulatory Visit: Payer: PPO | Admitting: Physical Therapy

## 2018-04-25 ENCOUNTER — Encounter: Payer: PPO | Admitting: Occupational Therapy

## 2018-04-30 ENCOUNTER — Ambulatory Visit: Payer: PPO | Attending: Neurology | Admitting: Occupational Therapy

## 2018-04-30 ENCOUNTER — Ambulatory Visit: Payer: PPO | Admitting: Physical Therapy

## 2018-04-30 ENCOUNTER — Encounter: Payer: Self-pay | Admitting: Occupational Therapy

## 2018-04-30 ENCOUNTER — Encounter: Payer: Self-pay | Admitting: Physical Therapy

## 2018-04-30 DIAGNOSIS — I609 Nontraumatic subarachnoid hemorrhage, unspecified: Secondary | ICD-10-CM | POA: Diagnosis not present

## 2018-04-30 DIAGNOSIS — R2681 Unsteadiness on feet: Secondary | ICD-10-CM | POA: Insufficient documentation

## 2018-04-30 DIAGNOSIS — R278 Other lack of coordination: Secondary | ICD-10-CM | POA: Insufficient documentation

## 2018-04-30 DIAGNOSIS — R262 Difficulty in walking, not elsewhere classified: Secondary | ICD-10-CM

## 2018-04-30 DIAGNOSIS — M6281 Muscle weakness (generalized): Secondary | ICD-10-CM | POA: Diagnosis not present

## 2018-04-30 DIAGNOSIS — R414 Neurologic neglect syndrome: Secondary | ICD-10-CM

## 2018-04-30 DIAGNOSIS — S065X9A Traumatic subdural hemorrhage with loss of consciousness of unspecified duration, initial encounter: Secondary | ICD-10-CM | POA: Diagnosis not present

## 2018-04-30 DIAGNOSIS — S065XAA Traumatic subdural hemorrhage with loss of consciousness status unknown, initial encounter: Secondary | ICD-10-CM

## 2018-04-30 NOTE — Therapy (Signed)
[place] MAIN [facility] SERVICES [address] [place], [place], [address] Phone: [phone]   Fax:  [phone]  Physical Therapy Treatment  Patient Details  Name: James [name] MRN: [id number] Date of Birth: 01/23/1941 Referring Provider (PT): shah   Encounter Date: 04/30/2018  PT End of Session - 04/30/18 0947    Visit Number  18    Number of Visits  25    Date for PT Re-Evaluation  05/21/18    Authorization Type  7/10 starting on 02/26/18 progress note    PT Start Time  0932    PT Stop Time  1014    PT Time Calculation (min)  42 min    Equipment Utilized During Treatment  Gait belt    Activity Tolerance  Patient tolerated treatment well    Behavior During Therapy  WFL for tasks assessed/performed       Past Medical History:  Diagnosis Date  . Alcohol abuse, in remission   . Atrial fibrillation ([place])   . Bilateral renal masses   . Closed right ankle fracture   . 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 Gait disorder   . Gout   . Hypertension   . Seizure disorder ([place])   . 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     Past Surgical History:  Procedure Laterality Date  . CRANIECTOMY FOR DEPRESSED SKULL FRACTURE  2009   with MRSA infection  . CRANIOTOMY     X 2  . HERNIA REPAIR     during childhood  . ORIF ANKLE FRACTURE Left 2012    There were no vitals filed for this visit.  Subjective Assessment - 04/30/18 0946    Subjective  Pt reports that he is doing well today. No specific questions or concerns. Denies pain and no external signs of discomfort. Wife denies any health changes since last visit.    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  He suffered another fall in 2017.  Since 2005 he  has suffered from expressive aphasia.    Patient Stated Goals  Patient wants to have a safer gait and not stagger or feel off balance.     Currently in Pain?  No/denies    Multiple Pain Sites  No       Review HEP: [employer] x 5 mins L 6 TM walking side stepping . 4 miles/ hour left and right x 3 mins Standing Heel raises x20 reps bilaterally, BUE support , supervision for safety Standing Marching x15 reps each leg, BUE support , supervision for safety Supine SLR x 15 BLE sidelying hip abd x 15 BLE  Leg press x 100 lbs x 20 x 2 Seated with green tband around BLE: Seated hip flexion march x15 bilaterally; Standing hip abduction x15 bilaterally; LAQ x15 bilaterally;ankle DF x15 bilaterally; Patient required min-moderate verbal/tactile cues for correct exercise technique with cues for tband placement and to increase ROM for better strengthening;    VCs for proper technique and positioning for each exercise                    PT Education - 04/30/18 0946    Education Details  saftey and HEP  Person(s) Educated  Patient    Methods  Explanation;Demonstration;Tactile cues;Verbal cues    Comprehension  Returned demonstration;Need further instruction       PT Short Term Goals - 03/29/18 0820      PT SHORT TERM GOAL #1   Title  Patient will be independent in home exercise program to improve strength/mobility for better functional independence with ADLs.    Time  6    Period  Weeks    Status  Partially Met    Target Date  04/09/18      PT SHORT TERM GOAL #2   Title  Patient (> 76 years old) will complete five times sit to stand test in < 15 seconds indicating an increased LE strength and improved balance.    Baseline  19.15 sec and needs CGA to min assist to keep from falling, 03/29/18=16.82 sec    Time  6    Period  Weeks    Status  Partially Met    Target Date  04/09/18        PT Long Term Goals - 03/29/18 0823      PT LONG TERM GOAL #1   Title   Patient will increase Berg Balance score by > 6 points to demonstrate decreased fall risk during functional activities.    Baseline  36/56 Merrilee Jansky,;  03/29/18 Berg = 36/52     Time  12    Period  Weeks    Status  Partially Met    Target Date  05/21/18      PT LONG TERM GOAL #2   Title  Patient will reduce timed up and go to <11 seconds to reduce fall risk and demonstrate improved transfer/gait ability.    Baseline  15.18 sec with poor safety judgement and uncontrolled sit, 03/29/18=13.02 sec    Time  12    Period  Weeks    Status  Partially Met    Target Date  05/21/18      PT LONG TERM GOAL #3   Title  Patient will increase dynamic gait index score to >19/24 as to demonstrate reduced fall risk and improved dynamic gait balance for better safety with community/home ambulation.     Baseline  16/24, 03/29/18=16/25    Time  12    Period  Weeks    Status  Partially Met    Target Date  05/21/18            Plan - 04/30/18 0948    Clinical Impression Statement  Pt requires direction and verbal cues for correct performance of exercises. Patient demonstrates weakness in BLE and performs open and closed chain exercises with no reports of pain. Pt was able to perform all exercises with max assist and VC for technique..   Patient struggles with speed during movement as well as balance with unstable surfaces.  Pt encouraged continuing new supine HEP .Follow-up as scheduled.    Rehab Potential  Good    PT Frequency  2x / week    PT Duration  12 weeks    PT Treatment/Interventions  Therapeutic activities;Therapeutic exercise;Balance training;Neuromuscular re-education;Functional mobility training;Gait training;Manual techniques    PT Next Visit Plan  balance and HEP    PT Home Exercise Plan  heel raises, bridges    Consulted and Agree with Plan of Care  Patient;Family member/caregiver       Patient will benefit from skilled therapeutic intervention in order to improve the following deficits and  impairments:  Abnormal gait, Decreased  coordination, Decreased activity tolerance, Decreased endurance, Decreased strength, Decreased balance, Decreased safety awareness, Difficulty walking  Visit Diagnosis: Muscle weakness (generalized)  Other lack of coordination  Unsteadiness on feet  Neurologic neglect syndrome  SDH (subdural hematoma) (HCC)  SAH (subarachnoid hemorrhage) (HCC)  Difficulty in walking, not elsewhere classified     Problem List Patient Active Problem List   Diagnosis Date Noted  . Sleep disturbance   . PAF (paroxysmal atrial fibrillation) ([place])   . Recurrent UTI   . Hypokalemia   . Hyponatremia   . Abnormal urine   . 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 Benign essential HTN   . Urinary retention   . History of gout   . Global aphasia   . SAH (subarachnoid hemorrhage) ([place])   . SDH (subdural hematoma) ([place])   . Lymphocytosis   . Subdural hemorrhage following injury ([place]) 05/12/2016    Alanson Puls, PT DPT 04/30/2018, 9:50 AM  [place] MAIN [facility] SERVICES [address] [place], [place], [address] Phone: [phone]   Fax:  [phone]  Name: James [name] MRN: [id number] Date of Birth: [birth year]

## 2018-04-30 NOTE — Therapy (Signed)
[place] MAIN [facility] SERVICES [address] [place], [place], [address] Phone: [phone]   Fax:  [phone]  Occupational Therapy Treatment  Patient Details  Name: James [name] MRN: [id number] Date of Birth: April 22, 1941 No data recorded  Encounter Date: 04/30/2018  OT End of Session - 04/30/18 0904    Visit Number  84    Number of Visits  54    Date for OT Re-Evaluation  06/08/18    Authorization Type  Medicare visit 2/10    OT Start Time  0845    OT Stop Time  0931    OT Time Calculation (min)  46 min    Activity Tolerance  Patient tolerated treatment well    Behavior During Therapy  [facility] for tasks assessed/performed       Past Medical History:  Diagnosis Date  . Alcohol abuse, in remission   . Atrial fibrillation ([place])   . Bilateral renal masses   . Closed right ankle fracture   . 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 Gait disorder   . Gout   . Hypertension   . Seizure disorder ([place])   . 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     Past Surgical History:  Procedure Laterality Date  . CRANIECTOMY FOR DEPRESSED SKULL FRACTURE  2009   with MRSA infection  . CRANIOTOMY     X 2  . HERNIA REPAIR     during childhood  . ORIF ANKLE FRACTURE Left 2012    There were no vitals filed for this visit.  Subjective Assessment - 04/30/18 0902    Subjective   Wife's sister brought patient today, she reports patients wife hurt her knee and is at the walk in clinic today.  Sister reports patient did not get a chance to do many exercises while on vacation but they did go out to eat peel and eat shrimp and he was able to use both hands to peel the shrimp.     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  He suffered another fall in 2017.  Since 2005 he  has suffered from expressive aphasia.     Patient Stated Goals  to be able to use the right hand functionally    Currently in Pain?  No/denies    Multiple Pain Sites  No          Patient indicating difficulty with managing separating magnets with one hand technique on the right (working towards isolated finger movements/coordination) Patient seen for bilateral hand use to separate large hook magnets, cues to pull with right hand while stabilizing with left.  Round disc magnets of various sizes with attempts to push apart with right hand but unable therefore switched to a bilateral hand use.  Small bulletin board magnets with attempts to separate with right hand only with cues but has increased difficulty, requires moderate cues, demonstration and occasional guiding.   Dropping multiple items frequently however he picks them up and persists to complete task.   Cues to not use his mouth to separate items.     Patient seen for handwriting skills with cues for tripod grasp on right, completing first and last name for multiple reps with improved legibility this date.  To continue to work on at home  in his notebook               OT Education - 04/30/18 [phone]    Education provided  Yes    Education Details  one handed manipulation to separate items, HEP    Person(s) Educated  Patient    Methods  Explanation;Demonstration;Verbal cues    Comprehension  Verbal cues required;Returned demonstration;Verbalized understanding          OT Long Term Goals - 04/22/18 0959      OT LONG TERM GOAL #1   Title  Patient will improve right hand function to hold utensil in right hand for self feeding with modified independence.     Baseline  unable to hold utensil at eval, now able to hold utensils and self feed with minimal spillage.    Time  12    Period  Weeks    Status  Achieved      OT LONG TERM GOAL #2   Title  Pt will increase hand strength in right by 5# to be able to cut food  with modified independence.    Baseline  unable at eval, able to cut meat with occasional assist at 30 visit.    Time  12    Period  Weeks    Status  Achieved      OT LONG TERM GOAL #3   Title  Patient will demonstrate increased awareness and strategies to attend to right side to hold items without dropping items.    Baseline  drops items frequently, 30 visit-patient dropping objects less frequently and is more aware when holding objects in hand    Time  12    Period  Weeks    Status  On-going      OT LONG TERM GOAL #4   Title  Patient will demonstrate independence in home exercise program for ROM, strength and coordination of right hand.    Baseline  no current program, modified independent with current program at 30 visit but continuing to add exercises.    Time  6    Period  Weeks    Status  On-going      OT LONG TERM GOAL #5   Title  Patient will demonstrate the ability to obtain items from right pocket 90% of the time.    Baseline  difficulty getting keys or coins out of right pocket, 30 visit update-able to demonstrate 75% of the time    Time  12    Period  Weeks    Status  On-going      OT LONG TERM GOAL #6   Title  Patient will demonstrate pouring a drink with the right hand with attention to right side and not knocking the cup over and spilling.      Baseline  patient often does not realize the cup is on his right side and knocks it over when attempting to get a drink    Time  12    Period  Weeks    Status  On-going            Plan - 04/30/18 0904    Clinical Impression Statement  Patient was on vacation last week most of the week with family and reports he was not able to do many exercises while being gone.  Sister in law reports going out to dinner and patient was spontaneously using his right hand to help peel shrimp which he would have required help for in the past or would  have attempted with only left hand.  Patient continues to demonstrate difficulty with  isolated finger movements on the right but has made good progress  in the last few months and now seeing carryover into daily activities.  Continue to work towards improving hand skills on the right for modulation of grasp, precision in graded pressure and fine motor coordination to manipulate small objects.     Occupational Profile and client history currently impacting functional performance  repeated falls, TBI, expressive aphasia, limited motion in right hand, lack of coordination from previous CVA    Occupational performance deficits (Please refer to evaluation for details):  ADL's;IADL's;Leisure    Rehab Potential  Good    Current Impairments/barriers affecting progress:  expressive aphasia, falls, neglect    OT Frequency  2x / week    OT Duration  12 weeks    OT Treatment/Interventions  Self-care/ADL training;Visual/perceptual remediation/compensation;DME and/or AE instruction;Patient/family education;Moist Heat;Therapeutic exercise;Manual Therapy;Therapeutic activities;Neuromuscular education    Consulted and Agree with Plan of Care  Patient       Patient will benefit from skilled therapeutic intervention in order to improve the following deficits and impairments:  Impaired vision/preception, Decreased coordination, Decreased safety awareness, Impaired UE functional use, Decreased strength  Visit Diagnosis: Muscle weakness (generalized)  Other lack of coordination  Neurologic neglect syndrome    Problem List Patient Active Problem List   Diagnosis Date Noted  . Sleep disturbance   . PAF (paroxysmal atrial fibrillation) ([place])   . Recurrent UTI   . Hypokalemia   . Hyponatremia   . Abnormal urine   . 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 Benign essential HTN   . Urinary retention   . History of gout   . Global aphasia   . SAH (subarachnoid hemorrhage) ([place])   . SDH (subdural hematoma) ([place])   . Lymphocytosis   . Subdural hemorrhage following injury ([place])  05/12/2016   Ziaire Hagos T Arnola Crittendon, OTR/L, CLT  Jolyne Laye 04/30/2018, 9:32 AM  [place] MAIN [facility] SERVICES [address] [place], [place], [address] Phone: [phone]   Fax:  [phone]  Name: Azar South MRN: [id number] Date of Birth: 07/21/1941

## 2018-05-02 ENCOUNTER — Ambulatory Visit: Payer: PPO | Admitting: Occupational Therapy

## 2018-05-02 ENCOUNTER — Ambulatory Visit: Payer: PPO | Admitting: Physical Therapy

## 2018-05-07 ENCOUNTER — Ambulatory Visit: Payer: PPO | Admitting: Physical Therapy

## 2018-05-07 ENCOUNTER — Encounter: Payer: Self-pay | Admitting: Physical Therapy

## 2018-05-07 ENCOUNTER — Ambulatory Visit: Payer: PPO | Admitting: Occupational Therapy

## 2018-05-07 DIAGNOSIS — I609 Nontraumatic subarachnoid hemorrhage, unspecified: Secondary | ICD-10-CM

## 2018-05-07 DIAGNOSIS — S065XAA Traumatic subdural hemorrhage with loss of consciousness status unknown, initial encounter: Secondary | ICD-10-CM

## 2018-05-07 DIAGNOSIS — S065X9A Traumatic subdural hemorrhage with loss of consciousness of unspecified duration, initial encounter: Secondary | ICD-10-CM

## 2018-05-07 DIAGNOSIS — R2681 Unsteadiness on feet: Secondary | ICD-10-CM

## 2018-05-07 DIAGNOSIS — R278 Other lack of coordination: Secondary | ICD-10-CM

## 2018-05-07 DIAGNOSIS — M6281 Muscle weakness (generalized): Secondary | ICD-10-CM | POA: Diagnosis not present

## 2018-05-07 DIAGNOSIS — R414 Neurologic neglect syndrome: Secondary | ICD-10-CM

## 2018-05-07 DIAGNOSIS — R262 Difficulty in walking, not elsewhere classified: Secondary | ICD-10-CM

## 2018-05-07 NOTE — Therapy (Signed)
[place] MAIN [facility] SERVICES [address] [place], [place], [address] Phone: [phone]   Fax:  [phone]  Physical Therapy Treatment  Patient Details  Name: James [name] MRN: [id number] Date of Birth: 1940-10-11 Referring Provider (PT): shah   Encounter Date: 05/07/2018  PT End of Session - 05/07/18 0938    Visit Number  19    Number of Visits  25    Date for PT Re-Evaluation  05/21/18    Authorization Type  9/10 starting on 02/26/18 progress note    PT Start Time  0932    PT Stop Time  1014    PT Time Calculation (min)  42 min    Equipment Utilized During Treatment  Gait belt    Activity Tolerance  Patient tolerated treatment well    Behavior During Therapy  WFL for tasks assessed/performed       Past Medical History:  Diagnosis Date  . Alcohol abuse, in remission   . Atrial fibrillation ([place])   . Bilateral renal masses   . Closed right ankle fracture   . 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 Gait disorder   . Gout   . Hypertension   . Seizure disorder ([place])   . 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     Past Surgical History:  Procedure Laterality Date  . CRANIECTOMY FOR DEPRESSED SKULL FRACTURE  2009   with MRSA infection  . CRANIOTOMY     X 2  . HERNIA REPAIR     during childhood  . ORIF ANKLE FRACTURE Left 2012    There were no vitals filed for this visit.  Subjective Assessment - 05/07/18 0938    Subjective  Pt reports that he is doing well today. No specific questions or concerns. Denies pain and no external signs of discomfort. Wife denies any health changes since last visit.    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  He suffered another fall in 2017.  Since 2005 he  has suffered from expressive aphasia.    Patient Stated Goals  Patient wants to have a safer gait and not stagger or feel off balance.     Currently in Pain?  No/denies    Multiple Pain Sites  No       TREATMENT NEUROMUSCULAR RE-EDUCATION [employer]  L4 x 5 minutes Matrix fwd/bwd, side to side with 22.5 lbs andmod assist x 3 each way except for only performed x 2 during L sidestepping. Pt demonstrates poor safety awareness, ataxic stepping, and general lack of safety during sidestepping both ways. Intermittent cues to slow down and concentrate on proper safe stepping.  Toe tapping 6 inch stool without UE assist, min assist with pt frequently losing balance while in single leg stance on RLE; Stepping over hurdle fwd/bwd, and side to side x 15 each direction with some foot catching and use of UE support  Tandem balance on Airex without UE support alternating forward LE 30s x 2 each Therapeutic exercise; Leg press 100 lbs x 20 x 2 sets  Bridges x 20  Hip abd/ER x 20 with GTB x 20 x 2  sidelying hip abd x 10 x 2 BLE  Patient is impulsive and  is not aware when he is getting off balance during ambulation following an activity that causes fatigue. He is not able to slow down movement during ambulation in the gym from one area to another and needs CGA for safety.                      PT Education - 05/07/18 0938    Education Details  HEP, safety     Person(s) Educated  Patient    Methods  Explanation;Tactile cues;Verbal cues;Demonstration    Comprehension  Returned demonstration;Need further instruction       PT Short Term Goals - 03/29/18 0820      PT SHORT TERM GOAL #1   Title  Patient will be independent in home exercise program to improve strength/mobility for better functional independence with ADLs.    Time  6    Period  Weeks    Status  Partially Met    Target Date  04/09/18      PT SHORT TERM GOAL #2   Title  Patient (> 57 years old) will complete five  times sit to stand test in < 15 seconds indicating an increased LE strength and improved balance.    Baseline  19.15 sec and needs CGA to min assist to keep from falling, 03/29/18=16.82 sec    Time  6    Period  Weeks    Status  Partially Met    Target Date  04/09/18        PT Long Term Goals - 03/29/18 0823      PT LONG TERM GOAL #1   Title  Patient will increase Berg Balance score by > 6 points to demonstrate decreased fall risk during functional activities.    Baseline  36/56 Merrilee Jansky,;  03/29/18 Berg = 36/52     Time  12    Period  Weeks    Status  Partially Met    Target Date  05/21/18      PT LONG TERM GOAL #2   Title  Patient will reduce timed up and go to <11 seconds to reduce fall risk and demonstrate improved transfer/gait ability.    Baseline  15.18 sec with poor safety judgement and uncontrolled sit, 03/29/18=13.02 sec    Time  12    Period  Weeks    Status  Partially Met    Target Date  05/21/18      PT LONG TERM GOAL #3   Title  Patient will increase dynamic gait index score to >19/24 as to demonstrate reduced fall risk and improved dynamic gait balance for better safety with community/home ambulation.     Baseline  16/24, 03/29/18=16/25    Time  12    Period  Weeks    Status  Partially Met    Target Date  05/21/18            Plan - 05/07/18 0940    Clinical Impression Statement  Pt was able to progress dynamic balance exercises today, noting improved postural reactions with LOB and moving outside normal BOS.  Pt was able to perform all exercises on uneven surfaces with minimal LOB noted.  Dynamic balance stability activities were progressed, with decrease control noted when attempting to perform tasks with narrow base of support.   Pt would continue to benefit from skilled therapy services to address further balance impairments and decrease falls risk.    Rehab Potential  Good    PT Frequency  2x /  week    PT Duration  12 weeks    PT Treatment/Interventions   Therapeutic activities;Therapeutic exercise;Balance training;Neuromuscular re-education;Functional mobility training;Gait training;Manual techniques    PT Next Visit Plan  balance and HEP    PT Home Exercise Plan  heel raises, bridges    Consulted and Agree with Plan of Care  Patient;Family member/caregiver       Patient will benefit from skilled therapeutic intervention in order to improve the following deficits and impairments:  Abnormal gait, Decreased coordination, Decreased activity tolerance, Decreased endurance, Decreased strength, Decreased balance, Decreased safety awareness, Difficulty walking  Visit Diagnosis: Muscle weakness (generalized)  Other lack of coordination  Unsteadiness on feet  Neurologic neglect syndrome  SDH (subdural hematoma) (HCC)  SAH (subarachnoid hemorrhage) (HCC)  Difficulty in walking, not elsewhere classified     Problem List Patient Active Problem List   Diagnosis Date Noted  . Sleep disturbance   . PAF (paroxysmal atrial fibrillation) ([place])   . Recurrent UTI   . Hypokalemia   . Hyponatremia   . Abnormal urine   . 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 Benign essential HTN   . Urinary retention   . History of gout   . Global aphasia   . SAH (subarachnoid hemorrhage) ([place])   . SDH (subdural hematoma) ([place])   . Lymphocytosis   . Subdural hemorrhage following injury ([place]) 05/12/2016    Alanson Puls, PT DPT 05/07/2018, 10:04 AM  [place] MAIN [facility] SERVICES [place], [place], [address] Phone: [phone]   Fax:  [phone]  Name: James [name] MRN: [id number] Date of Birth: Nov 19, 1940

## 2018-05-09 ENCOUNTER — Encounter: Payer: Self-pay | Admitting: Physical Therapy

## 2018-05-09 ENCOUNTER — Encounter: Payer: Self-pay | Admitting: Occupational Therapy

## 2018-05-09 ENCOUNTER — Ambulatory Visit: Payer: PPO | Admitting: Occupational Therapy

## 2018-05-09 ENCOUNTER — Ambulatory Visit: Payer: PPO | Admitting: Physical Therapy

## 2018-05-09 DIAGNOSIS — R278 Other lack of coordination: Secondary | ICD-10-CM

## 2018-05-09 DIAGNOSIS — M6281 Muscle weakness (generalized): Secondary | ICD-10-CM | POA: Diagnosis not present

## 2018-05-09 DIAGNOSIS — R2681 Unsteadiness on feet: Secondary | ICD-10-CM

## 2018-05-09 DIAGNOSIS — R262 Difficulty in walking, not elsewhere classified: Secondary | ICD-10-CM

## 2018-05-09 NOTE — Therapy (Signed)
[place] MAIN [facility] SERVICES [address] [place], [place], [address] Phone: [phone]   Fax:  [phone]  Physical Therapy Treatment Physical Therapy Progress Note   Dates of reporting period  02/26/2018   to   05/09/2018   Patient Details  Name: James [name] MRN: [id number] Date of Birth: 01/09/1941 Referring Provider (PT): shah   Encounter Date: 05/09/2018  PT End of Session - 05/09/18 0850    Visit Number  20    Number of Visits  25    Date for PT Re-Evaluation  05/21/18    Authorization Type  1/10 starting on 05/09/18 progress note    PT Start Time  0917    PT Stop Time  0958    PT Time Calculation (min)  41 min    Equipment Utilized During Treatment  Gait belt    Activity Tolerance  Patient tolerated treatment well    Behavior During Therapy  [facility] for tasks assessed/performed       Past Medical History:  Diagnosis Date  . Alcohol abuse, in remission   . Atrial fibrillation ([place])   . Bilateral renal masses   . Closed right ankle fracture   . 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 Gait disorder   . Gout   . Hypertension   . Seizure disorder ([employer])   . 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     Past Surgical History:  Procedure Laterality Date  . CRANIECTOMY FOR DEPRESSED SKULL FRACTURE  2009   with MRSA infection  . CRANIOTOMY     X 2  . HERNIA REPAIR     during childhood  . ORIF ANKLE FRACTURE Left 2012    There were no vitals filed for this visit.    Subjective Assessment - 05/09/18 0918    Subjective  Pt reports his day is going fine and he denies any pain. No falls or stumbles reported.    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  He suffered another fall in 2017.   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    Patient Stated Goals  Patient wants to have a safer gait and not stagger or feel off balance.     Currently in Pain?  No/denies    Multiple Pain Sites  No      TREATMENT Objective Measurements/Neuromuscular Re-education TUG: 10.53 sec BERG: 45/56 DGI: 17/24 Sit<> stand, split-stance, B, 2x5, VCs for slow, controlled movement, no LOB Standing, EC, perturbations 1 min, mat table behind with SBA/CGA, no LOB w/ limited and controlled postural sway  Therapeutic exercise: Bridges x 30 SLR 2x15, 3# ankle weight Patient requires VCs for controlled movements.  Patient continues to self-select a faster pace during dynamic activities and is unaware when unsteadiness occurs; he is responsive to education on pace and safety, but there is limited carryover inter- and intra-session.    PT Education - 05/09/18 0850    Education Details  exercise technique, safety, balance strategies    Person(s) Educated  Patient    Methods  Explanation;Demonstration;Tactile cues;Verbal cues    Comprehension  Returned demonstration;Need further instruction;Verbalized understanding       PT Short Term Goals - 05/09/18 7371  PT SHORT TERM GOAL #1   Title  Patient will be independent in home exercise program to improve strength/mobility for better functional independence with ADLs.    Baseline  05/09/2018: Pt reports doing bed exercises    Time  6    Period  Weeks    Status  Partially Met    Target Date  04/09/18      PT SHORT TERM GOAL #2   Title  Patient (> 60 years old) will complete five times sit to stand test in < 15 seconds indicating an increased LE strength and improved balance.    Baseline  19.15 sec and needs CGA to min assist to keep from falling, 03/29/18=16.82 sec, 05/09/2018 15.23sec    Time  6    Period  Weeks    Status  Partially Met    Target Date  04/09/18        PT Long Term Goals - 05/09/18 0924      PT LONG TERM GOAL #1    Title  Patient will increase Berg Balance score by > 6 points to demonstrate decreased fall risk during functional activities.    Baseline  36/56 Merrilee Jansky,;  03/29/18 Berg = 36/52; 05/09/18= 45/56    Time  12    Period  Weeks    Status  Achieved    Target Date  05/21/18      PT LONG TERM GOAL #2   Title  Patient will reduce timed up and go to <11 seconds to reduce fall risk and demonstrate improved transfer/gait ability.    Baseline  15.18 sec with poor safety judgement and uncontrolled sit, 03/29/18=13.02 sec, 05/09/2018 10.53 sec    Time  12    Period  Weeks    Status  Achieved    Target Date  05/21/18      PT LONG TERM GOAL #3   Title  Patient will increase dynamic gait index score to >19/24 as to demonstrate reduced fall risk and improved dynamic gait balance for better safety with community/home ambulation.     Baseline  16/24, 03/29/18=16/25; 05/09/18=17/24    Time  12    Period  Weeks    Status  Partially Met    Target Date  05/21/18      PT LONG TERM GOAL #4   Title  Patient will ascend and descend 4 stairs with single rail UE support using a step-through pattern with SBA in order to demonstrate improved balance, safety, and decrease risk of falls.    Baseline  05/09/18=CGA/Min A for safety, step-to pattern, LUE support,     Time  4    Period  Weeks    Status  New    Target Date  06/06/18            Plan - 05/09/18 0854    Clinical Impression Statement Patient demonstrates deficits in balance, safety, and LE strength and motor control as evidenced by his unsteadiness of gait with head turns, navigating obstacles, and lack of safety awareness during dynamic activities. Patient has made improvements in balance with scores on timed-up-and-go (10.53 sec) and Berg (45/56) indicating a decreased falls risk for individuals >25 years of age. Patient will benefit from continued skilled therapeutic intervention to address deficits in safety, balance, LE strength and motor control in  order to improve safety during dynamic activities and prevent falls. Patient's condition has the potential to improve in response to therapy. Maximum improvement is yet to be obtained. The anticipated  improvement is attainable and reasonable in a generally predictable time.    Rehab Potential  Good    PT Frequency  2x / week    PT Duration  12 weeks    PT Treatment/Interventions  Therapeutic activities;Therapeutic exercise;Balance training;Neuromuscular re-education;Functional mobility training;Gait training;Manual techniques    PT Next Visit Plan  progress balance and HEP    PT Home Exercise Plan  heel raises, bridges    Consulted and Agree with Plan of Care  Patient;Family member/caregiver       Patient will benefit from skilled therapeutic intervention in order to improve the following deficits and impairments:  Abnormal gait, Decreased coordination, Decreased activity tolerance, Decreased endurance, Decreased strength, Decreased balance, Decreased safety awareness, Difficulty walking  Visit Diagnosis: Muscle weakness (generalized)  Other lack of coordination  Unsteadiness on feet  Difficulty in walking, not elsewhere classified     Problem List Patient Active Problem List   Diagnosis Date Noted  . Sleep disturbance   . PAF (paroxysmal atrial fibrillation) ([place])   . Recurrent UTI   . Hypokalemia   . Hyponatremia   . Abnormal urine   . 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 Benign essential HTN   . Urinary retention   . History of gout   . Global aphasia   . SAH (subarachnoid hemorrhage) ([place])   . SDH (subdural hematoma) ([place])   . Lymphocytosis   . Subdural hemorrhage following injury [facility]) 05/12/2016   Myles Gip PT, DPT [phone] 05/09/2018, 10:40 AM  [place] MAIN [facility] SERVICES [place], [place], [address] Phone: [phone]   Fax:  [phone]  Name: James [name] MRN: [id number]  Date of  Birth: 10/17/1940

## 2018-05-09 NOTE — Therapy (Signed)
[place] MAIN [facility] SERVICES [address] [place], [place], [address] Phone: [phone]   Fax:  [phone]  Occupational Therapy Treatment  Patient Details  Name: James [name] MRN: [id number] Date of Birth: 14-Jul-1941 No data recorded  Encounter Date: 05/09/2018  OT End of Session - 05/09/18 0838    Visit Number  79    Number of Visits  39    Date for OT Re-Evaluation  06/08/18    Authorization Type  Medicare visit 3/10    OT Start Time  0830    OT Stop Time  0915    OT Time Calculation (min)  45 min    Activity Tolerance  Patient tolerated treatment well    Behavior During Therapy  [facility] for tasks assessed/performed       Past Medical History:  Diagnosis Date  . Alcohol abuse, in remission   . Atrial fibrillation ([place])   . Bilateral renal masses   . Closed right ankle fracture   . 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 Gait disorder   . Gout   . Hypertension   . Seizure disorder ([place])   . 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     Past Surgical History:  Procedure Laterality Date  . CRANIECTOMY FOR DEPRESSED SKULL FRACTURE  2009   with MRSA infection  . CRANIOTOMY     X 2  . HERNIA REPAIR     during childhood  . ORIF ANKLE FRACTURE Left 2012    There were no vitals filed for this visit.  Subjective Assessment - 05/09/18 0835    Subjective   Pt. reports that his wife's knee is better.    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  He suffered another fall in 2017.  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     Patient Stated Goals  to be able to use the right hand functionally    Currently in Pain?  No/denies       OT TREATMENT    Neuro muscular re-education:  Pt.  worked on [facility] grasping 1/2" small magnetic pegs, disconnecting them, and placing them on a positioned at a vertical angle at a tabletop. Pt. worked on isolated finger movements, separating, and moving the pegs through his hand. Pt. Worked on removing the pegs while working on thumb opposition to the tip of the 2nd digit.   Therapeutic Exercise:  Pt. Worked on pinch strengthening in the left hand for lateral, and 3pt. pinch using yellow, red, green, and blue resistive clips. Pt. worked on placing the clips at various vertical and horizontal angles. Tactile and verbal cues were required for eliciting the desired movement.  Selfcare:  Pt. Worked on [employer] with emphasis placed on formulating, and maintaining a tripod grasp on the pen. Writing was more legible at the beginning of each word, and became less legible at the end of the word. Pt. Required verbal cues, and visual demonstration for hand position on the pen.                        OT Education - 05/09/18 [phone]    Education provided  Yes  Education Details  one handed manipulation to separate items, HEP    Person(s) Educated  Patient    Methods  Explanation;Demonstration;Verbal cues    Comprehension  Verbal cues required;Returned demonstration;Verbalized understanding          OT Long Term Goals - 04/22/18 0959      OT LONG TERM GOAL #1   Title  Patient will improve right hand function to hold utensil in right hand for self feeding with modified independence.     Baseline  unable to hold utensil at eval, now able to hold utensils and self feed with minimal spillage.    Time  12    Period  Weeks    Status  Achieved      OT LONG TERM GOAL #2   Title  Pt will increase hand strength in right by 5# to be able to cut food with modified independence.    Baseline  unable at eval, able to cut meat with occasional assist at 30 visit.    Time  12    Period  Weeks    Status  Achieved      OT LONG TERM  GOAL #3   Title  Patient will demonstrate increased awareness and strategies to attend to right side to hold items without dropping items.    Baseline  drops items frequently, 30 visit-patient dropping objects less frequently and is more aware when holding objects in hand    Time  12    Period  Weeks    Status  On-going      OT LONG TERM GOAL #4   Title  Patient will demonstrate independence in home exercise program for ROM, strength and coordination of right hand.    Baseline  no current program, modified independent with current program at 30 visit but continuing to add exercises.    Time  6    Period  Weeks    Status  On-going      OT LONG TERM GOAL #5   Title  Patient will demonstrate the ability to obtain items from right pocket 90% of the time.    Baseline  difficulty getting keys or coins out of right pocket, 30 visit update-able to demonstrate 75% of the time    Time  12    Period  Weeks    Status  On-going      OT LONG TERM GOAL #6   Title  Patient will demonstrate pouring a drink with the right hand with attention to right side and not knocking the cup over and spilling.      Baseline  patient often does not realize the cup is on his right side and knocks it over when attempting to get a drink    Time  12    Period  Weeks    Status  On-going            Plan - 05/09/18 0839    Clinical Impression Statement  Pt. presents with impaired legibility when writing his name, and difficulty maintaining atripod grasp on the pen. Writing was more legible at the beginning of each word, and progressively becomes less legible towards the end of the word. Pt. continues to focus on [facility] skills needed to manipulate objects within his daily tasks. Pt. conitnues to work on improving hand function skills with emphasis on isolated finger movements.    Occupational Profile and client history currently impacting functional performance  repeated falls, TBI, expressive aphasia, limited motion in  right hand, lack of coordination from previous CVA    Occupational performance deficits (Please refer to evaluation for details):  ADL's;IADL's;Leisure    Rehab Potential  Good    Current Impairments/barriers affecting progress:  expressive aphasia, falls, neglect    OT Frequency  2x / week    OT Duration  12 weeks    OT Treatment/Interventions  Self-care/ADL training;Visual/perceptual remediation/compensation;DME and/or AE instruction;Patient/family education;Moist Heat;Therapeutic exercise;Manual Therapy;Therapeutic activities;Neuromuscular education    Clinical Decision Making  Several treatment options, min-mod task modification necessary    Consulted and Agree with Plan of Care  Patient    Family Member Consulted  wife, [name]       Patient will benefit from skilled therapeutic intervention in order to improve the following deficits and impairments:  Impaired vision/preception, Decreased coordination, Decreased safety awareness, Impaired UE functional use, Decreased strength  Visit Diagnosis: Other lack of coordination  Muscle weakness (generalized)    Problem List Patient Active Problem List   Diagnosis Date Noted  . Sleep disturbance   . PAF (paroxysmal atrial fibrillation) ([place])   . Recurrent UTI   . Hypokalemia   . Hyponatremia   . Abnormal urine   . 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 Benign essential HTN   . Urinary retention   . History of gout   . Global aphasia   . SAH (subarachnoid hemorrhage) ([place])   . SDH (subdural hematoma) ([place])   . Lymphocytosis   . Subdural hemorrhage following injury ([place]) 05/12/2016    Harrel Carina, MS, OTR/L 05/09/2018, 9:32 AM  [place] MAIN [facility] SERVICES [address] [place], [place], [address] Phone: [phone]   Fax:  [phone]  Name: Dyami Umbach MRN: [id number] Date of Birth: 17-Mar-1941

## 2018-05-14 ENCOUNTER — Ambulatory Visit: Payer: PPO | Admitting: Occupational Therapy

## 2018-05-14 ENCOUNTER — Ambulatory Visit: Payer: PPO | Admitting: Physical Therapy

## 2018-05-14 ENCOUNTER — Encounter: Payer: Self-pay | Admitting: Physical Therapy

## 2018-05-14 ENCOUNTER — Encounter: Payer: Self-pay | Admitting: Occupational Therapy

## 2018-05-14 DIAGNOSIS — R262 Difficulty in walking, not elsewhere classified: Secondary | ICD-10-CM

## 2018-05-14 DIAGNOSIS — M6281 Muscle weakness (generalized): Secondary | ICD-10-CM

## 2018-05-14 DIAGNOSIS — R278 Other lack of coordination: Secondary | ICD-10-CM

## 2018-05-14 DIAGNOSIS — S065XAA Traumatic subdural hemorrhage with loss of consciousness status unknown, initial encounter: Secondary | ICD-10-CM

## 2018-05-14 DIAGNOSIS — I609 Nontraumatic subarachnoid hemorrhage, unspecified: Secondary | ICD-10-CM

## 2018-05-14 DIAGNOSIS — R2681 Unsteadiness on feet: Secondary | ICD-10-CM

## 2018-05-14 DIAGNOSIS — R414 Neurologic neglect syndrome: Secondary | ICD-10-CM

## 2018-05-14 DIAGNOSIS — S065X9A Traumatic subdural hemorrhage with loss of consciousness of unspecified duration, initial encounter: Secondary | ICD-10-CM

## 2018-05-14 NOTE — Therapy (Signed)
[place] MAIN [facility] SERVICES [address] [place], [place], [address] Phone: [phone]   Fax:  [phone]  Physical Therapy Treatment  Patient Details  Name: James [name] MRN: [id number] Date of Birth: 1941/04/09 Referring Provider (PT): shah   Encounter Date: 05/14/2018  PT End of Session - 05/14/18 0959    Visit Number  21    Number of Visits  25    Date for PT Re-Evaluation  05/21/18    Authorization Type  2/10 starting on 05/09/18 progress note    PT Start Time  0915    PT Stop Time  1000    PT Time Calculation (min)  45 min    Equipment Utilized During Treatment  Gait belt    Activity Tolerance  Patient tolerated treatment well    Behavior During Therapy  [facility] for tasks assessed/performed       Past Medical History:  Diagnosis Date  . Alcohol abuse, in remission   . Atrial fibrillation ([place])   . Bilateral renal masses   . Closed right ankle fracture   . 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 Gait disorder   . Gout   . Hypertension   . Seizure disorder ([place])   . 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     Past Surgical History:  Procedure Laterality Date  . CRANIECTOMY FOR DEPRESSED SKULL FRACTURE  2009   with MRSA infection  . CRANIOTOMY     X 2  . HERNIA REPAIR     during childhood  . ORIF ANKLE FRACTURE Left 2012    There were no vitals filed for this visit.  Subjective Assessment - 05/14/18 0958    Subjective  Pt reports his day is going fine and he denies any pain. No falls or stumbles reported.    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  He suffered another fall in 2017.  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    Patient Stated Goals  Patient wants to  have a safer gait and not stagger or feel off balance.     Currently in Pain?  No/denies    Multiple Pain Sites  No          Review HEP: [employer] x 5 mins L 6 TM walking side stepping . 4 miles/ hour left and right x 3 mins Standing Heel raises x20 reps bilaterally, BUE support , supervision for safety Standing Marching with 3 lbs  x15 reps each leg, BUE support , supervision for safety Supine SLR , 3 lbs x 15 BLE sidelying hip abd, 3 lbs  x 15 BLE Leg press x 100 lbs x 20 x 2 Seated with green tband around BLE: Seated hip flexion march, 3 lbs  x15 bilaterally; Standing hip abduction x15 bilaterally; LAQ x15 bilaterally;ankle DF x15 bilaterally; Patient required min-moderate verbal/tactile cues for correct exercise techniquewith cues for tband placement and to increase ROM for better strengthening;   VCs for proper technique and positioning for each exercise                      PT Education - 05/14/18 0959    Education Details  HEP  Person(s) Educated  Patient    Methods  Explanation;Demonstration;Tactile cues;Verbal cues    Comprehension  Returned demonstration;Verbal cues required;Tactile cues required;Need further instruction       PT Short Term Goals - 05/09/18 6226      PT SHORT TERM GOAL #1   Title  Patient will be independent in home exercise program to improve strength/mobility for better functional independence with ADLs.    Baseline  05/09/2018: Pt reports doing bed exercises    Time  6    Period  Weeks    Status  Partially Met    Target Date  04/09/18      PT SHORT TERM GOAL #2   Title  Patient (> 36 years old) will complete five times sit to stand test in < 15 seconds indicating an increased LE strength and improved balance.    Baseline  19.15 sec and needs CGA to min assist to keep from falling, 03/29/18=16.82 sec, 05/09/2018 15.23sec    Time  6    Period  Weeks    Status  Partially Met    Target Date  04/09/18        PT  Long Term Goals - 05/09/18 0924      PT LONG TERM GOAL #1   Title  Patient will increase Berg Balance score by > 6 points to demonstrate decreased fall risk during functional activities.    Baseline  36/56 Merrilee Jansky,;  03/29/18 Berg = 36/52; 05/09/18= 45/56    Time  12    Period  Weeks    Status  Achieved    Target Date  05/21/18      PT LONG TERM GOAL #2   Title  Patient will reduce timed up and go to <11 seconds to reduce fall risk and demonstrate improved transfer/gait ability.    Baseline  15.18 sec with poor safety judgement and uncontrolled sit, 03/29/18=13.02 sec, 05/09/2018 10.53 sec    Time  12    Period  Weeks    Status  Achieved    Target Date  05/21/18      PT LONG TERM GOAL #3   Title  Patient will increase dynamic gait index score to >19/24 as to demonstrate reduced fall risk and improved dynamic gait balance for better safety with community/home ambulation.     Baseline  16/24, 03/29/18=16/25; 05/09/18=17/24    Time  12    Period  Weeks    Status  Partially Met    Target Date  05/21/18      PT LONG TERM GOAL #4   Title  Patient will ascend and descend 4 stairs with single rail UE support using a step-through pattern with SBA in order to demonstrate improved balance, safety, and decrease risk of falls.    Baseline  05/09/18=CGA/Min A for safety, step-to pattern, LUE support,     Time  4    Period  Weeks    Status  New    Target Date  06/06/18            Plan - 05/14/18 1001    Clinical Impression Statement  Patient demonstrates understanding of HEP with moderate corrections needed. Patient challenged with sit to stand transfer with multiple repetitions due to fatigue. Weak LE combined with weak core musculature results in poor postural control and balance deficits. Patient will continue to benefit from skilled physical therapy to improve balance and mobility.    Rehab Potential  Good    PT Frequency  2x /  week    PT Duration  12 weeks    PT Treatment/Interventions   Therapeutic activities;Therapeutic exercise;Balance training;Neuromuscular re-education;Functional mobility training;Gait training;Manual techniques    PT Next Visit Plan  progress balance and HEP    PT Home Exercise Plan  heel raises, bridges    Consulted and Agree with Plan of Care  Patient;Family member/caregiver       Patient will benefit from skilled therapeutic intervention in order to improve the following deficits and impairments:  Abnormal gait, Decreased coordination, Decreased activity tolerance, Decreased endurance, Decreased strength, Decreased balance, Decreased safety awareness, Difficulty walking  Visit Diagnosis: Other lack of coordination  Muscle weakness (generalized)  Unsteadiness on feet  Difficulty in walking, not elsewhere classified  Neurologic neglect syndrome  SDH (subdural hematoma) ([facility])  SAH (subarachnoid hemorrhage) ([place])     Problem List Patient Active Problem List   Diagnosis Date Noted  . Sleep disturbance   . PAF (paroxysmal atrial fibrillation) ([place])   . Recurrent UTI   . Hypokalemia   . Hyponatremia   . Abnormal urine   . 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 Benign essential HTN   . Urinary retention   . History of gout   . Global aphasia   . SAH (subarachnoid hemorrhage) ([place])   . SDH (subdural hematoma) ([place])   . Lymphocytosis   . Subdural hemorrhage following injury ([place]) 05/12/2016     Alanson Puls, PT DPT 05/14/2018, 10:04 AM  [place] MAIN [facility] SERVICES [address] [place], [place], [address] Phone: [phone]   Fax:  [phone]  Name: Basheer Molchan MRN: [id number] Date of Birth: Aug 02, 1940

## 2018-05-16 ENCOUNTER — Encounter: Payer: Self-pay | Admitting: Physical Therapy

## 2018-05-16 ENCOUNTER — Ambulatory Visit: Payer: PPO | Admitting: Physical Therapy

## 2018-05-16 ENCOUNTER — Ambulatory Visit: Payer: PPO | Admitting: Occupational Therapy

## 2018-05-16 DIAGNOSIS — R2681 Unsteadiness on feet: Secondary | ICD-10-CM

## 2018-05-16 DIAGNOSIS — R262 Difficulty in walking, not elsewhere classified: Secondary | ICD-10-CM

## 2018-05-16 DIAGNOSIS — M6281 Muscle weakness (generalized): Secondary | ICD-10-CM

## 2018-05-16 DIAGNOSIS — S065XAA Traumatic subdural hemorrhage with loss of consciousness status unknown, initial encounter: Secondary | ICD-10-CM

## 2018-05-16 DIAGNOSIS — I609 Nontraumatic subarachnoid hemorrhage, unspecified: Secondary | ICD-10-CM

## 2018-05-16 DIAGNOSIS — R414 Neurologic neglect syndrome: Secondary | ICD-10-CM

## 2018-05-16 DIAGNOSIS — R278 Other lack of coordination: Secondary | ICD-10-CM

## 2018-05-16 DIAGNOSIS — S065X9A Traumatic subdural hemorrhage with loss of consciousness of unspecified duration, initial encounter: Secondary | ICD-10-CM

## 2018-05-16 NOTE — Therapy (Signed)
[place] MAIN [facility] SERVICES [address] [place], [place], [address] Phone: [phone]   Fax:  [phone]  Physical Therapy Treatment  Patient Details  Name: James [name] MRN: [id number] Date of Birth: 09-07-1940 Referring Provider (PT): shah   Encounter Date: 05/16/2018  PT End of Session - 05/16/18 1003    Visit Number  22    Number of Visits  25    Date for PT Re-Evaluation  05/21/18    Authorization Type  3/10 starting on 05/09/18 progress note    PT Start Time  0930    PT Stop Time  1015    PT Time Calculation (min)  45 min    Equipment Utilized During Treatment  Gait belt    Activity Tolerance  Patient tolerated treatment well    Behavior During Therapy  WFL for tasks assessed/performed       Past Medical History:  Diagnosis Date  . Alcohol abuse, in remission   . Atrial fibrillation ([place])   . Bilateral renal masses   . Closed right ankle fracture   . 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 Gait disorder   . Gout   . Hypertension   . Seizure disorder ([place])   . 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     Past Surgical History:  Procedure Laterality Date  . CRANIECTOMY FOR DEPRESSED SKULL FRACTURE  2009   with MRSA infection  . CRANIOTOMY     X 2  . HERNIA REPAIR     during childhood  . ORIF ANKLE FRACTURE Left 2012    There were no vitals filed for this visit.  Subjective Assessment - 05/16/18 1002    Subjective  Pt reports his day is going fine and he denies any pain. No falls or stumbles reported.    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  He suffered another fall in 2017.  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    Patient Stated Goals  Patient wants to  have a safer gait and not stagger or feel off balance.     Currently in Pain?  No/denies    Multiple Pain Sites  No         Therapeutic exercise:   Octane fitness level 3 for warm up (unbilled) x39mns   Standing on 1/2 bolster (Flat side up): Heel/toe rock 2x10 with finger tip hold for balance with cues to keep knee straight for better ankle strategies;   Stepping over 2 hurdle fwd side to side x 10   Stepping over 1 hurdle bwd x10 cues for posture, increased hip flexion   Standing hip abd 3 lbs 10 x 2  bilaterally   Standing hip extension without GTB due to increased fatigue today 10 x 2  BLE      Cues for core activation of TA in standing to reduce back pain and for improved posture. Patient needed several sitting rest breaks during session.   Review HEP: Heel raises x20 reps bilaterally, BUE support in // bars, supervision for safety Marching x15 reps each leg, BUE support in // bars, supervision for safety Tandem stance, eyes open, x10 sec holds with each foot in rear x2 bouts  each foot, supervision for safety, no UE support Tandem stance, eyes closed, x10 sec holds with each foot in rear x2 bouts each foot, supervision for safety, no UE support -VCs for proper technique and positioning for each exercise                       PT Education - 05/16/18 1003    Education Details  HEP, safety    Person(s) Educated  Patient    Methods  Explanation;Demonstration;Tactile cues;Verbal cues    Comprehension  Returned demonstration       PT Short Term Goals - 05/09/18 0922      PT SHORT TERM GOAL #1   Title  Patient will be independent in home exercise program to improve strength/mobility for better functional independence with ADLs.    Baseline  05/09/2018: Pt reports doing bed exercises    Time  6    Period  Weeks    Status  Partially Met    Target Date  04/09/18      PT SHORT TERM GOAL #2   Title  Patient (> 62 years old) will complete five times sit  to stand test in < 15 seconds indicating an increased LE strength and improved balance.    Baseline  19.15 sec and needs CGA to min assist to keep from falling, 03/29/18=16.82 sec, 05/09/2018 15.23sec    Time  6    Period  Weeks    Status  Partially Met    Target Date  04/09/18        PT Long Term Goals - 05/09/18 0924      PT LONG TERM GOAL #1   Title  Patient will increase Berg Balance score by > 6 points to demonstrate decreased fall risk during functional activities.    Baseline  36/56 Merrilee Jansky,;  03/29/18 Berg = 36/52; 05/09/18= 45/56    Time  12    Period  Weeks    Status  Achieved    Target Date  05/21/18      PT LONG TERM GOAL #2   Title  Patient will reduce timed up and go to <11 seconds to reduce fall risk and demonstrate improved transfer/gait ability.    Baseline  15.18 sec with poor safety judgement and uncontrolled sit, 03/29/18=13.02 sec, 05/09/2018 10.53 sec    Time  12    Period  Weeks    Status  Achieved    Target Date  05/21/18      PT LONG TERM GOAL #3   Title  Patient will increase dynamic gait index score to >19/24 as to demonstrate reduced fall risk and improved dynamic gait balance for better safety with community/home ambulation.     Baseline  16/24, 03/29/18=16/25; 05/09/18=17/24    Time  12    Period  Weeks    Status  Partially Met    Target Date  05/21/18      PT LONG TERM GOAL #4   Title  Patient will ascend and descend 4 stairs with single rail UE support using a step-through pattern with SBA in order to demonstrate improved balance, safety, and decrease risk of falls.    Baseline  05/09/18=CGA/Min A for safety, step-to pattern, LUE support,     Time  4    Period  Weeks    Status  New    Target Date  06/06/18            Plan - 05/16/18 1004  Clinical Impression Statement  Patient demonstrates LOB with standing balance exercises indicating decreased balancing strategies. Patient did require UE support to perform sidestepping up and over exercise.  Patient will benefit from further skilled therapy to return to prior level of function. .  Pt was encouraged to perform HEP during the week in order to continue progressing balance and strength interventions.  Pt would continue to benefit from skilled therapy services in order to further address LE strength deficits and balance deficits in order to decrease fall risk and improve mobility.    Rehab Potential  Good    PT Frequency  2x / week    PT Duration  12 weeks    PT Treatment/Interventions  Therapeutic activities;Therapeutic exercise;Balance training;Neuromuscular re-education;Functional mobility training;Gait training;Manual techniques    PT Next Visit Plan  progress balance and HEP    PT Home Exercise Plan  heel raises, bridges    Consulted and Agree with Plan of Care  Patient;Family member/caregiver       Patient will benefit from skilled therapeutic intervention in order to improve the following deficits and impairments:  Abnormal gait, Decreased coordination, Decreased activity tolerance, Decreased endurance, Decreased strength, Decreased balance, Decreased safety awareness, Difficulty walking  Visit Diagnosis: Muscle weakness (generalized)  Other lack of coordination  Neurologic neglect syndrome  Unsteadiness on feet  Difficulty in walking, not elsewhere classified  SDH (subdural hematoma) (HCC)  SAH (subarachnoid hemorrhage) ([place])     Problem List Patient Active Problem List   Diagnosis Date Noted  . Sleep disturbance   . PAF (paroxysmal atrial fibrillation) ([place])   . Recurrent UTI   . Hypokalemia   . Hyponatremia   . Abnormal urine   . 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 Benign essential HTN   . Urinary retention   . History of gout   . Global aphasia   . SAH (subarachnoid hemorrhage) ([place])   . SDH (subdural hematoma) ([place])   . Lymphocytosis   . Subdural hemorrhage following injury ([place]) 05/12/2016    Alanson Puls, PT  DPT 05/16/2018, 10:06 AM  [place] MAIN [facility] SERVICES [address] [place], [place], [address] Phone: [phone]   Fax:  [phone]  Name: James [name] MRN: [id number] Date of Birth: 03-Nov-1940

## 2018-05-19 ENCOUNTER — Encounter: Payer: Self-pay | Admitting: Occupational Therapy

## 2018-05-19 NOTE — Therapy (Signed)
[place] MAIN [facility] SERVICES [address] [place], [place], [address] Phone: [phone]   Fax:  [phone]  Occupational Therapy Treatment  Patient Details  Name: James [name] MRN: [id number] Date of Birth: January 11, 1941 No data recorded  Encounter Date: 05/14/2018  OT End of Session - 05/19/18 1209    Visit Number  48    Number of Visits  38    Date for OT Re-Evaluation  06/08/18    Authorization Type  Medicare visit 4/10    OT Start Time  0829    OT Stop Time  0915    OT Time Calculation (min)  46 min    Activity Tolerance  Patient tolerated treatment well    Behavior During Therapy  [facility] for tasks assessed/performed       Past Medical History:  Diagnosis Date  . Alcohol abuse, in remission   . Atrial fibrillation ([place])   . Bilateral renal masses   . Closed right ankle fracture   . 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 Gait disorder   . Gout   . Hypertension   . Seizure disorder ([place])   . 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     Past Surgical History:  Procedure Laterality Date  . CRANIECTOMY FOR DEPRESSED SKULL FRACTURE  2009   with MRSA infection  . CRANIOTOMY     X 2  . HERNIA REPAIR     during childhood  . ORIF ANKLE FRACTURE Left 2012    There were no vitals filed for this visit.  Subjective Assessment - 05/19/18 1209    Subjective   Patient indicating he has had a good week, worked on some handwriting.  Smiling and indicating he is glad therapist is back from vacation.     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  He suffered another fall in 2017.  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     Patient Stated Goals  to be able to use the right hand functionally    Currently in Pain?  No/denies    Multiple  Pain Sites  No         Patient seen for focus on prehension patterns with right hand to pick up small objects 1/2 in size, cues and therapist demonstration of prehension patterns  With use of thumb and index combinations. Patient tends to revert to a lateral pinch and requires extensive cues to use tip to tip pinch for precision tasks.   Patient seen for handwriting skills with minimal cues for pen grip, focusing on name                  OT Education - 05/19/18 1209    Education provided  Yes    Education Details  manipulation of objects    Person(s) Educated  Patient    Methods  Explanation;Demonstration;Verbal cues    Comprehension  Verbal cues required;Returned demonstration;Verbalized understanding          OT Long Term Goals - 05/14/18 0846      OT LONG TERM GOAL #1   Title  Patient will improve right hand function to hold utensil in right hand for self feeding with modified independence.     Baseline  unable to hold utensil  at eval, now able to hold utensils and self feed with minimal spillage.    Time  12    Period  Weeks    Status  Achieved      OT LONG TERM GOAL #2   Title  Pt will increase hand strength in right by 5# to be able to cut food with modified independence.    Baseline  unable at eval, able to cut meat with occasional assist at 30 visit.    Time  12    Period  Weeks    Status  Achieved      OT LONG TERM GOAL #3   Title  Patient will demonstrate increased awareness and strategies to attend to right side to hold items without dropping items.    Baseline  drops items frequently, 30 visit-patient dropping objects less frequently and is more aware when holding objects in hand    Time  12    Period  Weeks    Status  On-going      OT LONG TERM GOAL #4   Title  Patient will demonstrate independence in home exercise program for ROM, strength and coordination of right hand.    Baseline  no current program, modified independent with current  program at 30 visit but continuing to add exercises.    Time  6    Period  Weeks    Status  On-going      OT LONG TERM GOAL #5   Title  Patient will demonstrate the ability to obtain items from right pocket 90% of the time.    Baseline  difficulty getting keys or coins out of right pocket, 30 visit update-able to demonstrate 75% of the time    Time  12    Period  Weeks    Status  On-going      OT LONG TERM GOAL #6   Title  Patient will demonstrate pouring a drink with the right hand with attention to right side and not knocking the cup over and spilling.      Baseline  patient often does not realize the cup is on his right side and knocks it over when attempting to get a drink    Time  12    Period  Weeks    Status  On-going            Plan - 05/19/18 1210    Clinical Impression Statement  Patient continues to demonstrate difficulty with prehension patterns with right hand with use of index and thumb to pick up and manipulate small objects.  Patient tends to pick up items with more of a lateral pinch rather than tip to tip pinch.  Handwriting continues to lack legibility at times however he is improving with grasp on pen and size of letters. Continues to work towards goals to increase independence in daily tasks.     Occupational Profile and client history currently impacting functional performance  repeated falls, TBI, expressive aphasia, limited motion in right hand, lack of coordination from previous CVA    Occupational performance deficits (Please refer to evaluation for details):  ADL's;IADL's;Leisure    Rehab Potential  Good    Current Impairments/barriers affecting progress:  expressive aphasia, falls, neglect    OT Frequency  2x / week    OT Treatment/Interventions  Self-care/ADL training;Visual/perceptual remediation/compensation;DME and/or AE instruction;Patient/family education;Moist Heat;Therapeutic exercise;Manual Therapy;Therapeutic activities;Neuromuscular education     Consulted and Agree with Plan of Care  Patient    Family Member  Consulted  wife, [name]       Patient will benefit from skilled therapeutic intervention in order to improve the following deficits and impairments:  Impaired vision/preception, Decreased coordination, Decreased safety awareness, Impaired UE functional use, Decreased strength  Visit Diagnosis: Muscle weakness (generalized)  Other lack of coordination  Neurologic neglect syndrome    Problem List Patient Active Problem List   Diagnosis Date Noted  . Sleep disturbance   . PAF (paroxysmal atrial fibrillation) ([place])   . Recurrent UTI   . Hypokalemia   . Hyponatremia   . Abnormal urine   . 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 Benign essential HTN   . Urinary retention   . History of gout   . Global aphasia   . SAH (subarachnoid hemorrhage) ([place])   . SDH (subdural hematoma) ([place])   . Lymphocytosis   . Subdural hemorrhage following injury [facility]) 05/12/2016   Amy T Lovett, OTR/L, CLT  Lovett,Amy 05/19/2018, 12:13 PM  [place] MAIN [facility] SERVICES [address] [place], [place], [address] Phone: [phone]   Fax:  [phone]  Name: James [name] MRN: [id number] Date of Birth: 02/24/1941

## 2018-05-19 NOTE — Therapy (Signed)
[place] MAIN [facility] SERVICES [address] [place], [place], [address] Phone: [phone]   Fax:  [phone]  Occupational Therapy Treatment  Patient Details  Name: James [name] MRN: [id number] Date of Birth: 11/23/1940 No data recorded  Encounter Date: 05/16/2018  OT End of Session - 05/19/18 1446    Visit Number  65    Number of Visits  76    Date for OT Re-Evaluation  06/08/18    Authorization Type  Medicare visit 5/10    OT Start Time  1015    OT Stop Time  1100    OT Time Calculation (min)  45 min    Activity Tolerance  Patient tolerated treatment well    Behavior During Therapy  [facility] for tasks assessed/performed       Past Medical History:  Diagnosis Date  . Alcohol abuse, in remission   . Atrial fibrillation ([place])   . Bilateral renal masses   . Closed right ankle fracture   . 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 Gait disorder   . Gout   . Hypertension   . Seizure disorder ([place])   . 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     Past Surgical History:  Procedure Laterality Date  . CRANIECTOMY FOR DEPRESSED SKULL FRACTURE  2009   with MRSA infection  . CRANIOTOMY     X 2  . HERNIA REPAIR     during childhood  . ORIF ANKLE FRACTURE Left 2012    There were no vitals filed for this visit.  Subjective Assessment - 05/19/18 1445    Subjective   No pain today.  Reports he has worked on using the magnets at home but rarely.    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  He suffered another fall in 2017.  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     Patient Stated Goals  to be able to use the right hand functionally    Currently in Pain?  No/denies    Multiple Pain Sites  No         Manipulation of Magnets working  to separate set of 4-5 with right hand only and place onto the board.  Multiple trials completed, dropping items frequently, difficulty to pick up object with right hand from the floor, often has to switch to left hand to pick up when dropped.  Min to moderate difficulty with separating objects with one hand only but has improved since the  first attempt at this task when he was unable to complete. Difficulty with removing magnets from board using  a tip to tip grasp, tends to use a lateral pinch for the task and has moderate difficulty changing. Increased difficulty if the board is placed in a upright plane versus flat to the table.   Flipping cards with use of tip to tip pinch with min to moderate cues and demonstration.  Once he was able to demonstrate consistently, added complexity to task by sorting cards by 4 suits while maintaining the use of a tip to tip grasp onto each card.  Patient able to detect often when he uses incorrect pinch and able to correct.  OT Education - 05/19/18 1446    Education provided  Yes    Education Details  manipulation of objects    Person(s) Educated  Patient    Methods  Explanation;Demonstration;Verbal cues    Comprehension  Verbal cues required;Returned demonstration;Verbalized understanding          OT Long Term Goals - 05/14/18 0846      OT LONG TERM GOAL #1   Title  Patient will improve right hand function to hold utensil in right hand for self feeding with modified independence.     Baseline  unable to hold utensil at eval, now able to hold utensils and self feed with minimal spillage.    Time  12    Period  Weeks    Status  Achieved      OT LONG TERM GOAL #2   Title  Pt will increase hand strength in right by 5# to be able to cut food with modified independence.    Baseline  unable at eval, able to cut meat with occasional assist at 30 visit.    Time  12    Period  Weeks    Status  Achieved      OT LONG TERM  GOAL #3   Title  Patient will demonstrate increased awareness and strategies to attend to right side to hold items without dropping items.    Baseline  drops items frequently, 30 visit-patient dropping objects less frequently and is more aware when holding objects in hand    Time  12    Period  Weeks    Status  On-going      OT LONG TERM GOAL #4   Title  Patient will demonstrate independence in home exercise program for ROM, strength and coordination of right hand.    Baseline  no current program, modified independent with current program at 30 visit but continuing to add exercises.    Time  6    Period  Weeks    Status  On-going      OT LONG TERM GOAL #5   Title  Patient will demonstrate the ability to obtain items from right pocket 90% of the time.    Baseline  difficulty getting keys or coins out of right pocket, 30 visit update-able to demonstrate 75% of the time    Time  12    Period  Weeks    Status  On-going      OT LONG TERM GOAL #6   Title  Patient will demonstrate pouring a drink with the right hand with attention to right side and not knocking the cup over and spilling.      Baseline  patient often does not realize the cup is on his right side and knocks it over when attempting to get a drink    Time  12    Period  Weeks    Status  On-going            Plan - 05/19/18 1446    Clinical Impression Statement  Difficulty with removing magnets from board using  a tip to tip grasp, tends to use a lateral pinch for the task and has moderate difficulty changing. Increased difficulty if the board is placed in a upright plane versus flat to the table.  Patient able to detect often when he uses incorrect pinch and able to correct.   Continue to work towards goals to impact right hand function and right sided neglect to increase safety and independence  in daily tasks.     Occupational Profile and client history currently impacting functional performance  repeated falls, TBI,  expressive aphasia, limited motion in right hand, lack of coordination from previous CVA    Occupational performance deficits (Please refer to evaluation for details):  ADL's;IADL's;Leisure    Current Impairments/barriers affecting progress:  expressive aphasia, falls, neglect    OT Frequency  2x / week    OT Duration  12 weeks    OT Treatment/Interventions  Self-care/ADL training;Visual/perceptual remediation/compensation;DME and/or AE instruction;Patient/family education;Moist Heat;Therapeutic exercise;Manual Therapy;Therapeutic activities;Neuromuscular education    Consulted and Agree with Plan of Care  Patient       Patient will benefit from skilled therapeutic intervention in order to improve the following deficits and impairments:  Impaired vision/preception, Decreased coordination, Decreased safety awareness, Impaired UE functional use, Decreased strength  Visit Diagnosis: Muscle weakness (generalized)  Other lack of coordination  Neurologic neglect syndrome    Problem List Patient Active Problem List   Diagnosis Date Noted  . Sleep disturbance   . PAF (paroxysmal atrial fibrillation) ([place])   . Recurrent UTI   . Hypokalemia   . Hyponatremia   . Abnormal urine   . 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 Benign essential HTN   . Urinary retention   . History of gout   . Global aphasia   . SAH (subarachnoid hemorrhage) ([place])   . SDH (subdural hematoma) ([place])   . Lymphocytosis   . Subdural hemorrhage following injury ([place]) 05/12/2016   Derreck Wiltsey T Brianah Hopson, OTR/L, CLT  Demisha Nokes 05/19/2018, 2:48 PM  [place] MAIN [facility] SERVICES [address] [place], [place], [address] Phone: [phone]   Fax:  [phone]  Name: James [name] MRN: [id number] Date of Birth: [birth year]

## 2018-05-21 ENCOUNTER — Ambulatory Visit: Payer: PPO | Admitting: Occupational Therapy

## 2018-05-21 ENCOUNTER — Encounter: Payer: Self-pay | Admitting: Physical Therapy

## 2018-05-21 ENCOUNTER — Ambulatory Visit: Payer: PPO | Admitting: Physical Therapy

## 2018-05-21 DIAGNOSIS — M6281 Muscle weakness (generalized): Secondary | ICD-10-CM

## 2018-05-21 DIAGNOSIS — R414 Neurologic neglect syndrome: Secondary | ICD-10-CM

## 2018-05-21 DIAGNOSIS — I609 Nontraumatic subarachnoid hemorrhage, unspecified: Secondary | ICD-10-CM

## 2018-05-21 DIAGNOSIS — R278 Other lack of coordination: Secondary | ICD-10-CM

## 2018-05-21 DIAGNOSIS — S065X9A Traumatic subdural hemorrhage with loss of consciousness of unspecified duration, initial encounter: Secondary | ICD-10-CM

## 2018-05-21 DIAGNOSIS — R262 Difficulty in walking, not elsewhere classified: Secondary | ICD-10-CM

## 2018-05-21 DIAGNOSIS — R2681 Unsteadiness on feet: Secondary | ICD-10-CM

## 2018-05-21 DIAGNOSIS — S065XAA Traumatic subdural hemorrhage with loss of consciousness status unknown, initial encounter: Secondary | ICD-10-CM

## 2018-05-21 NOTE — Therapy (Addendum)
[place] MAIN [facility] SERVICES [address] [place], [place], [address] Phone: [phone]   Fax:  [phone]  Physical Therapy Treatment  Patient Details  Name: James [name] MRN: [id number] Date of Birth: 1941-03-18 Referring Provider (PT): shah   Encounter Date: 05/21/2018  PT End of Session - 05/21/18 1410    Visit Number  23    Number of Visits  25    Date for PT Re-Evaluation  05/21/18    Authorization Type  4/10 starting on 05/09/18 progress note    PT Start Time  0935    PT Stop Time  1015    PT Time Calculation (min)  40 min    Equipment Utilized During Treatment  Gait belt    Activity Tolerance  Patient tolerated treatment well    Behavior During Therapy  WFL for tasks assessed/performed       Past Medical History:  Diagnosis Date  . Alcohol abuse, in remission   . Atrial fibrillation ([place])   . Bilateral renal masses   . Closed right ankle fracture   . 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 Gait disorder   . Gout   . Hypertension   . Seizure disorder ([place])   . 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     Past Surgical History:  Procedure Laterality Date  . CRANIECTOMY FOR DEPRESSED SKULL FRACTURE  2009   with MRSA infection  . CRANIOTOMY     X 2  . HERNIA REPAIR     during childhood  . ORIF ANKLE FRACTURE Left 2012    There were no vitals filed for this visit.  Subjective Assessment - 05/21/18 1405    Subjective  Pt reports his day is going fine and he denies any pain. No falls or stumbles reported.    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  He suffered another fall in 2017.  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    Patient Stated Goals  Patient wants to  have a safer gait and not stagger or feel off balance.     Currently in Pain?  No/denies    Multiple Pain Sites  No        Treatment: Octane fitness x 5 mins L 6   Single leg step over hurdle x 20 fwd/bwd and side to side left and right with min assist and in parallel bars with poor stepping quality and decreased coordination  Standing on blue foam and tapping 6 inch stool x 20 , CGA  Standing on blue foam and stepping up to  6 inch stool x 20, CGA  4 square fwd/bwd, side to side x 10 with CGA  Fwd stepping in agility ladder, side stepping in agility ladder in parallel bars with CGA and poor motor control  Slow marching in parallel bars fro better control and CGA      CGA and  mod verbal cues used throughout with increased in postural sway and LOB most seen with narrow base of support and while on uneven surfaces. Continues to have balance deficits typical with diagnosis. Patient performs intermediate level exercises, and needs verbal cuing for postural alignment and head positioning Tactile cues and assistance  needed to keep patient form loss of balance.                    PT Education - 05/21/18 1409    Education Details  HEP    Person(s) Educated  Patient    Methods  Explanation    Comprehension  Returned demonstration;Need further instruction       PT Short Term Goals - 05/09/18 4097      PT SHORT TERM GOAL #1   Title  Patient will be independent in home exercise program to improve strength/mobility for better functional independence with ADLs.    Baseline  05/09/2018: Pt reports doing bed exercises    Time  6    Period  Weeks    Status  Partially Met    Target Date  04/09/18      PT SHORT TERM GOAL #2   Title  Patient (> 37 years old) will complete five times sit to stand test in < 15 seconds indicating an increased LE strength and improved balance.    Baseline  19.15 sec and needs CGA to min assist to keep from falling, 03/29/18=16.82 sec, 05/09/2018  15.23sec    Time  6    Period  Weeks    Status  Partially Met    Target Date  04/09/18        PT Long Term Goals - 05/09/18 0924      PT LONG TERM GOAL #1   Title  Patient will increase Berg Balance score by > 6 points to demonstrate decreased fall risk during functional activities.    Baseline  36/56 Merrilee Jansky,;  03/29/18 Berg = 36/52; 05/09/18= 45/56    Time  12    Period  Weeks    Status  Achieved    Target Date  05/21/18      PT LONG TERM GOAL #2   Title  Patient will reduce timed up and go to <11 seconds to reduce fall risk and demonstrate improved transfer/gait ability.    Baseline  15.18 sec with poor safety judgement and uncontrolled sit, 03/29/18=13.02 sec, 05/09/2018 10.53 sec    Time  12    Period  Weeks    Status  Achieved    Target Date  05/21/18      PT LONG TERM GOAL #3   Title  Patient will increase dynamic gait index score to >19/24 as to demonstrate reduced fall risk and improved dynamic gait balance for better safety with community/home ambulation.     Baseline  16/24, 03/29/18=16/25; 05/09/18=17/24    Time  12    Period  Weeks    Status  Partially Met    Target Date  05/21/18      PT LONG TERM GOAL #4   Title  Patient will ascend and descend 4 stairs with single rail UE support using a step-through pattern with SBA in order to demonstrate improved balance, safety, and decrease risk of falls.    Baseline  05/09/18=CGA/Min A for safety, step-to pattern, LUE support,     Time  4    Period  Weeks    Status  New    Target Date  06/06/18         Clinical Impression:  Pt presents with unsteadiness on uneven surfaces and fatigues with therapeutic exercises. Patient needs assist with instruction for balance with side stepping on uneven surfaces and needs CGA assist with balance activities. Patient demonstrates difficulty with side stepping, motor control  for BLE foot placement, and navigating small spaces with decreased base of support and increased challenges for LE.   Patient tolerated all interventions well this date and will benefit from continued skilled PT interventions to improve strength and balance and decrease risk of falling  Plan - 05/21/18 1420    Rehab Potential  Good    PT Frequency  2x / week    PT Duration  12 weeks    PT Treatment/Interventions  Therapeutic activities;Therapeutic exercise;Balance training;Neuromuscular re-education;Functional mobility training;Gait training;Manual techniques    PT Next Visit Plan  progress balance and HEP    PT Home Exercise Plan  heel raises, bridges    Consulted and Agree with Plan of Care  Patient;Family member/caregiver       Patient will benefit from skilled therapeutic intervention in order to improve the following deficits and impairments:  Abnormal gait, Decreased coordination, Decreased activity tolerance, Decreased endurance, Decreased strength, Decreased balance, Decreased safety awareness, Difficulty walking  Visit Diagnosis: Muscle weakness (generalized)  Other lack of coordination  Neurologic neglect syndrome  Unsteadiness on feet  Difficulty in walking, not elsewhere classified  SDH (subdural hematoma) (HCC)  SAH (subarachnoid hemorrhage) ([place])     Problem List Patient Active Problem List   Diagnosis Date Noted  . Sleep disturbance   . PAF (paroxysmal atrial fibrillation) ([place])   . Recurrent UTI   . Hypokalemia   . Hyponatremia   . Abnormal urine   . 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 Benign essential HTN   . Urinary retention   . History of gout   . Global aphasia   . SAH (subarachnoid hemorrhage) ([place])   . SDH (subdural hematoma) ([place])   . Lymphocytosis   . Subdural hemorrhage following injury ([place]) 05/12/2016    Alanson Puls, PT DPT 05/21/2018, 2:26 PM  [place] MAIN [facility] SERVICES [address] [place], [place], [address] Phone: [phone]   Fax:  [phone]  Name: James [name] MRN: [id number] Date of Birth: 1941-03-26

## 2018-05-23 ENCOUNTER — Ambulatory Visit: Payer: PPO | Admitting: Occupational Therapy

## 2018-05-23 ENCOUNTER — Ambulatory Visit: Payer: PPO | Admitting: Physical Therapy

## 2018-05-23 ENCOUNTER — Encounter: Payer: Self-pay | Admitting: Physical Therapy

## 2018-05-23 DIAGNOSIS — M6281 Muscle weakness (generalized): Secondary | ICD-10-CM

## 2018-05-23 DIAGNOSIS — I609 Nontraumatic subarachnoid hemorrhage, unspecified: Secondary | ICD-10-CM

## 2018-05-23 DIAGNOSIS — R278 Other lack of coordination: Secondary | ICD-10-CM

## 2018-05-23 DIAGNOSIS — R414 Neurologic neglect syndrome: Secondary | ICD-10-CM

## 2018-05-23 DIAGNOSIS — S065X9A Traumatic subdural hemorrhage with loss of consciousness of unspecified duration, initial encounter: Secondary | ICD-10-CM

## 2018-05-23 DIAGNOSIS — R2681 Unsteadiness on feet: Secondary | ICD-10-CM

## 2018-05-23 DIAGNOSIS — S065XAA Traumatic subdural hemorrhage with loss of consciousness status unknown, initial encounter: Secondary | ICD-10-CM

## 2018-05-23 DIAGNOSIS — R262 Difficulty in walking, not elsewhere classified: Secondary | ICD-10-CM

## 2018-05-23 NOTE — Addendum Note (Signed)
Addended by: Alanson Puls on: 05/23/2018 10:24 AM   Modules accepted: Orders

## 2018-05-23 NOTE — Therapy (Signed)
[place] MAIN [facility] SERVICES [address] [place], [place], [address] Phone: [phone]   Fax:  [phone]  Physical Therapy Treatment  Patient Details  Name: James [name] MRN: [id number] Date of Birth: 04-19-1941 Referring Provider (PT): shah   Encounter Date: 05/23/2018  PT End of Session - 05/23/18 0936    Visit Number  24    Number of Visits  25    Date for PT Re-Evaluation  05/21/18    Authorization Type  5/10 starting on 05/09/18 progress note    Equipment Utilized During Treatment  Gait belt    Activity Tolerance  Patient tolerated treatment well    Behavior During Therapy  [facility] for tasks assessed/performed       Past Medical History:  Diagnosis Date  . Alcohol abuse, in remission   . Atrial fibrillation ([place])   . Bilateral renal masses   . Closed right ankle fracture   . 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 Gait disorder   . Gout   . Hypertension   . Seizure disorder ([place])   . 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     Past Surgical History:  Procedure Laterality Date  . CRANIECTOMY FOR DEPRESSED SKULL FRACTURE  2009   with MRSA infection  . CRANIOTOMY     X 2  . HERNIA REPAIR     during childhood  . ORIF ANKLE FRACTURE Left 2012    There were no vitals filed for this visit.  Subjective Assessment - 05/23/18 0936    Subjective  Pt reports his day is going fine and he denies any pain. No falls or stumbles reported.    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  He suffered another fall in 2017.  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    Patient Stated Goals  Patient wants to have a safer gait and not stagger or feel off balance.     Currently in Pain?   No/denies    Multiple Pain Sites  No       Review HEP: Octane fitness x 5 mins L 6 Four square with mod assist fwd/ bwd x 10 Step ups to 6 inch stool x 20  Stepping over 1/2 foam x 20  TM walking side stepping . 4 miles/ hour left and right x 3 mins StandingHeel raises x20 reps bilaterally, BUE support , supervision for safety StandingMarching with 3 lbs  x15 reps each leg, BUE support , supervision for safety Supine SLR , 3 lbs x 15 BLE sidelying hip abd, 3 lbs  x 15 BLE Leg press x 100 lbs x 20 x 2 Seated with green tband around BLE: Seatedhip flexion march, 3 lbs  x15 bilaterally; Standinghip abduction x15 bilaterally; LAQ x15 bilaterally;ankle DF x15 bilaterally; Patient required [name] verbal/tactile cues for correct exercise techniquewith cues for tband placement and to increase ROM for better strengthening; VCs for proper technique and positioning for each exercise                        PT Education - 05/23/18 0936    Education Details  safety , HEP    Person(s) Educated  Patient  Methods  Explanation;Demonstration;Tactile cues    Comprehension  Returned demonstration;Need further instruction       PT Short Term Goals - 05/09/18 8882      PT SHORT TERM GOAL #1   Title  Patient will be independent in home exercise program to improve strength/mobility for better functional independence with ADLs.    Baseline  05/09/2018: Pt reports doing bed exercises    Time  6    Period  Weeks    Status  Partially Met    Target Date  04/09/18      PT SHORT TERM GOAL #2   Title  Patient (> 43 years old) will complete five times sit to stand test in < 15 seconds indicating an increased LE strength and improved balance.    Baseline  19.15 sec and needs CGA to [name], 03/29/18=16.82 sec, 05/09/2018 15.23sec    Time  6    Period  Weeks    Status  Partially Met    Target Date  04/09/18        PT Long Term Goals - 05/09/18  0924      PT LONG TERM GOAL #1   Title  Patient will increase Berg Balance score by > 6 points to demonstrate decreased fall risk during functional activities.    Baseline  36/56 Merrilee Jansky,;  03/29/18 Berg = 36/52; 05/09/18= 45/56    Time  12    Period  Weeks    Status  Achieved    Target Date  05/21/18      PT LONG TERM GOAL #2   Title  Patient will reduce timed up and go to <11 seconds to reduce fall risk and demonstrate improved transfer/gait ability.    Baseline  15.18 sec with poor safety judgement and uncontrolled sit, 03/29/18=13.02 sec, 05/09/2018 10.53 sec    Time  12    Period  Weeks    Status  Achieved    Target Date  05/21/18      PT LONG TERM GOAL #3   Title  Patient will increase dynamic gait index score to >19/24 as to demonstrate reduced fall risk and improved dynamic gait balance for better safety with community/home ambulation.     Baseline  16/24, 03/29/18=16/25; 05/09/18=17/24    Time  12    Period  Weeks    Status  Partially Met    Target Date  05/21/18      PT LONG TERM GOAL #4   Title  Patient will ascend and descend 4 stairs with single rail UE support using a step-through pattern with SBA in order to demonstrate improved balance, safety, and decrease risk of falls.    Baseline  05/09/18=[name] A for safety, step-to pattern, LUE support,     Time  4    Period  Weeks    Status  New    Target Date  06/06/18            Plan - 05/23/18 0941    Clinical Impression Statement  Patient demonstrates leg weakness, and impaired balance and lack of coordination   that is causing difficulty with walking and mobility activities.  Patient still fatigues quickly to due very weak core and LE.  Patient showed poor saftey  technique and is able to generate good form after heavy cueing. Patient will continue to benefit from skilled physical therapy to improve xxx strength and xxx to improve quality of life    Rehab Potential  Good    PT Frequency  2x / week    PT Duration  12  weeks    PT Treatment/Interventions  Therapeutic activities;Therapeutic exercise;Balance training;Neuromuscular re-education;Functional mobility training;Gait training;Manual techniques    PT Next Visit Plan  progress balance and HEP    PT Home Exercise Plan  heel raises, bridges    Consulted and Agree with Plan of Care  Patient;Family member/caregiver       Patient will benefit from skilled therapeutic intervention in order to improve the following deficits and impairments:  Abnormal gait, Decreased coordination, Decreased activity tolerance, Decreased endurance, Decreased strength, Decreased balance, Decreased safety awareness, Difficulty walking  Visit Diagnosis: Muscle weakness (generalized)  Other lack of coordination  Neurologic neglect syndrome  Unsteadiness on feet  Difficulty in walking, not elsewhere classified  SDH (subdural hematoma) (HCC)  SAH (subarachnoid hemorrhage) ([place])     Problem List Patient Active Problem List   Diagnosis Date Noted  . Sleep disturbance   . PAF (paroxysmal atrial fibrillation) ([place])   . Recurrent UTI   . Hypokalemia   . Hyponatremia   . Abnormal urine   . 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 Benign essential HTN   . Urinary retention   . History of gout   . Global aphasia   . SAH (subarachnoid hemorrhage) ([place])   . SDH (subdural hematoma) ([place])   . Lymphocytosis   . Subdural hemorrhage following injury ([place]) 05/12/2016    Alanson Puls, PT DPT 05/23/2018, 10:05 AM  [place] MAIN [facility] SERVICES [address] [place], [place], [address] Phone: [phone]   Fax:  [phone]  Name: James [name] MRN: [id number] Date of Birth: 06/17/1941

## 2018-05-24 ENCOUNTER — Encounter: Payer: Self-pay | Admitting: Occupational Therapy

## 2018-05-24 NOTE — Therapy (Signed)
[place] MAIN [facility] SERVICES [address] [place], [place], [address] Phone: [phone]   Fax:  [phone]  Occupational Therapy Treatment  Patient Details  Name: James [name] MRN: [id number] Date of Birth: [birth year] No data recorded  Encounter Date: 05/21/2018  OT End of Session - 05/24/18 1244    Visit Number  66    Number of Visits  37    Date for OT Re-Evaluation  06/08/18    Authorization Type  Medicare visit 6/10    OT Start Time  1015    OT Stop Time  1100    OT Time Calculation (min)  45 min    Activity Tolerance  Patient tolerated treatment well    Behavior During Therapy  [facility] for tasks assessed/performed       Past Medical History:  Diagnosis Date  . Alcohol abuse, in remission   . Atrial fibrillation ([place])   . Bilateral renal masses   . Closed right ankle fracture   . 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 Gait disorder   . Gout   . Hypertension   . Seizure disorder ([place])   . 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     Past Surgical History:  Procedure Laterality Date  . CRANIECTOMY FOR DEPRESSED SKULL FRACTURE  2009   with MRSA infection  . CRANIOTOMY     X 2  . HERNIA REPAIR     during childhood  . ORIF ANKLE FRACTURE Left 2012    There were no vitals filed for this visit.  Subjective Assessment - 05/24/18 1244    Subjective   Wife reports she and the patient would like to be able to operate his flip phone to call for help or call her when she is out and about.  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  He suffered another fall in 2017.  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     Patient Stated Goals  to be able to use the right hand  functionally    Currently in Pain?  No/denies    Multiple Pain Sites  No          Patient seen this date with focus on how to utilize Cell phone (flip phone) in order to call close family members and emergency numbers. Patient has moderate to max difficulty with recognizing what buttons to use to make a call and has increased difficulty with navigating and accessing contact list to determine who he will need to call (Emergency services, wife, sister in law, etc.)  Default at the top of the contact list is emergency services and cannot be changed, patient's instinct is just to press OK and this is the option highlighted.    Modification of task as follows:  use of stickers and numbers in sequence on the keys to place a call as follows: Number 1 indicates Opening contact list Number 2 activates scrolldown bar to highlight options of who to call Number 3 Activates the send button to call the highlighted person Patient can then close the flip phone to end the call.  Multiple trials completed with variations and patient performs best with numbered  stickers on phone 1-3.  Instructed wife and sister in law regarding how patient utilizes modifications.  Patient to practice at home and follow up next session.  Fine motor coordination skills: Manipulation of small pieces of Purdue pegboard with washers, dowel and collars, small in size, cues for manipulation/prehension.                    OT Education - 05/24/18 1244    Education provided  Yes    Education Details  modifications for use of cell phone    Person(s) Educated  Patient    Methods  Explanation;Demonstration;Verbal cues    Comprehension  Verbal cues required;Returned demonstration;Verbalized understanding          OT Long Term Goals - 05/14/18 0846      OT LONG TERM GOAL #1   Title  Patient will improve right hand function to hold utensil in right hand for self feeding with modified independence.     Baseline  unable  to hold utensil at eval, now able to hold utensils and self feed with minimal spillage.    Time  12    Period  Weeks    Status  Achieved      OT LONG TERM GOAL #2   Title  Pt will increase hand strength in right by 5# to be able to cut food with modified independence.    Baseline  unable at eval, able to cut meat with occasional assist at 30 visit.    Time  12    Period  Weeks    Status  Achieved      OT LONG TERM GOAL #3   Title  Patient will demonstrate increased awareness and strategies to attend to right side to hold items without dropping items.    Baseline  drops items frequently, 30 visit-patient dropping objects less frequently and is more aware when holding objects in hand    Time  12    Period  Weeks    Status  On-going      OT LONG TERM GOAL #4   Title  Patient will demonstrate independence in home exercise program for ROM, strength and coordination of right hand.    Baseline  no current program, modified independent with current program at 30 visit but continuing to add exercises.    Time  6    Period  Weeks    Status  On-going      OT LONG TERM GOAL #5   Title  Patient will demonstrate the ability to obtain items from right pocket 90% of the time.    Baseline  difficulty getting keys or coins out of right pocket, 30 visit update-able to demonstrate 75% of the time    Time  12    Period  Weeks    Status  On-going      OT LONG TERM GOAL #6   Title  Patient will demonstrate pouring a drink with the right hand with attention to right side and not knocking the cup over and spilling.      Baseline  patient often does not realize the cup is on his right side and knocks it over when attempting to get a drink    Time  12    Period  Weeks    Status  On-going            Plan - 05/24/18 1245    Clinical Impression Statement  Patient has been unable to utilize his  cell phone and wife reports she would like for patient to be able to call for help or be able to call her  or family member if she is away at the store. Patient demonstrated difficulty with the coordination of buttons, which buttons to push and how to choose contacts with use of a flip phone.  Modifications were made to use numbered stickers with steps 1-3 as outlined above to place a call.  Patient was able to demonstrate after multiple trials and will continue to work on the next few days and report back to see if this method will be a viable option and successful for patient and wife.      Occupational Profile and client history currently impacting functional performance  repeated falls, TBI, expressive aphasia, limited motion in right hand, lack of coordination from previous CVA    Occupational performance deficits (Please refer to evaluation for details):  ADL's;IADL's;Leisure    Rehab Potential  Good    Current Impairments/barriers affecting progress:  expressive aphasia, falls, neglect    OT Frequency  2x / week    OT Duration  12 weeks    OT Treatment/Interventions  Self-care/ADL training;Visual/perceptual remediation/compensation;DME and/or AE instruction;Patient/family education;Moist Heat;Therapeutic exercise;Manual Therapy;Therapeutic activities;Neuromuscular education    Consulted and Agree with Plan of Care  Patient       Patient will benefit from skilled therapeutic intervention in order to improve the following deficits and impairments:  Impaired vision/preception, Decreased coordination, Decreased safety awareness, Impaired UE functional use, Decreased strength  Visit Diagnosis: Muscle weakness (generalized)  Other lack of coordination  Neurologic neglect syndrome    Problem List Patient Active Problem List   Diagnosis Date Noted  . Sleep disturbance   . PAF (paroxysmal atrial fibrillation) ([place])   . Recurrent UTI   . Hypokalemia   . Hyponatremia   . Abnormal urine   . 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 Benign essential HTN   . Urinary retention   .  History of gout   . Global aphasia   . SAH (subarachnoid hemorrhage) ([place])   . SDH (subdural hematoma) ([place])   . Lymphocytosis   . Subdural hemorrhage following injury ([place]) 05/12/2016   James [name] T James [name], OTR/L, CLT  James [name] 05/24/2018, 12:49 PM  [place] MAIN [facility] SERVICES [address] [place], [place], [address] Phone: [phone]   Fax:  [phone]  Name: James [name] MRN: [id number] Date of Birth: Jun 25, 1941

## 2018-05-24 NOTE — Therapy (Signed)
[place] MAIN [facility] SERVICES [address] [place], [place], [address] Phone: [phone]   Fax:  [phone]  Occupational Therapy Treatment  Patient Details  Name: James [name] MRN: [id number] Date of Birth: 03-27-1941 No data recorded  Encounter Date: 05/23/2018  OT End of Session - 05/24/18 1952    Visit Number  75    Number of Visits  84    Date for OT Re-Evaluation  06/08/18    Authorization Type  Medicare visit 7/10    OT Start Time  1015    OT Stop Time  1101    OT Time Calculation (min)  46 min    Activity Tolerance  Patient tolerated treatment well    Behavior During Therapy  [facility] for tasks assessed/performed       Past Medical History:  Diagnosis Date  . Alcohol abuse, in remission   . Atrial fibrillation ([place])   . Bilateral renal masses   . Closed right ankle fracture   . 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 Gait disorder   . Gout   . Hypertension   . Seizure disorder ([place])   . 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     Past Surgical History:  Procedure Laterality Date  . CRANIECTOMY FOR DEPRESSED SKULL FRACTURE  2009   with MRSA infection  . CRANIOTOMY     X 2  . HERNIA REPAIR     during childhood  . ORIF ANKLE FRACTURE Left 2012    There were no vitals filed for this visit.  Subjective Assessment - 05/24/18 1951    Subjective   Wife states, "You are Brilliant!"  she reports the patient did well with the cell phone option with use of numbers on the phone to signify the steps of how to place a call.   No pain noted.     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  He suffered another fall in 2017.  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     Patient Stated Goals  to be able to use the right hand  functionally    Currently in Pain?  No/denies    Multiple Pain Sites  No            [place] for flipping and turning with right hand, cues for use of MF and thumb to pick up, index finger to turn Small beads the size of pills/medication, picking up and sorting into 2 week medicine pill sorter and then removing items.  Occasional cues for prehension patterns but also demonstrates higher level hand skills when opening, closing and moving flip top with tip of hand to place item in.   Handwriting for name, first and last name in script with use of small built up pen with triangular grip, use of smaller lined paper and cues for size of letters as well as tripod grip on pen.                 OT Education - 05/24/18 1952    Education provided  Yes    Education Details  use of pill organizer, manipulation of simulated medications    Person(s) Educated  Patient    Methods  Explanation;Demonstration;Verbal cues    Comprehension  Verbal cues  required;Returned demonstration;Verbalized understanding          OT Long Term Goals - 05/14/18 0846      OT LONG TERM GOAL #1   Title  Patient will improve right hand function to hold utensil in right hand for self feeding with modified independence.     Baseline  unable to hold utensil at eval, now able to hold utensils and self feed with minimal spillage.    Time  12    Period  Weeks    Status  Achieved      OT LONG TERM GOAL #2   Title  Pt will increase hand strength in right by 5# to be able to cut food with modified independence.    Baseline  unable at eval, able to cut meat with occasional assist at 30 visit.    Time  12    Period  Weeks    Status  Achieved      OT LONG TERM GOAL #3   Title  Patient will demonstrate increased awareness and strategies to attend to right side to hold items without dropping items.    Baseline  drops items frequently, 30 visit-patient dropping objects less frequently and is more aware when  holding objects in hand    Time  12    Period  Weeks    Status  On-going      OT LONG TERM GOAL #4   Title  Patient will demonstrate independence in home exercise program for ROM, strength and coordination of right hand.    Baseline  no current program, modified independent with current program at 30 visit but continuing to add exercises.    Time  6    Period  Weeks    Status  On-going      OT LONG TERM GOAL #5   Title  Patient will demonstrate the ability to obtain items from right pocket 90% of the time.    Baseline  difficulty getting keys or coins out of right pocket, 30 visit update-able to demonstrate 75% of the time    Time  12    Period  Weeks    Status  On-going      OT LONG TERM GOAL #6   Title  Patient will demonstrate pouring a drink with the right hand with attention to right side and not knocking the cup over and spilling.      Baseline  patient often does not realize the cup is on his right side and knocks it over when attempting to get a drink    Time  12    Period  Weeks    Status  On-going            Plan - 05/24/18 1953    Clinical Impression Statement  Patient and wife pleased with patients ability to use cell phone over the last couple days as well as his progress with his right hand.  Patient was able to demonstrate improved coordination and manipulation skills to pick up and sort small objects the shape and size of pills to place into 2 week medication sort box.  He is dropped items occasionally but is demonstrating higher level hand and coordination skills compared to when he first started therapy.  Instructed wife and patient on how he can work on this skill at home.     Occupational Profile and client history currently impacting functional performance  repeated falls, TBI, expressive aphasia, limited motion in right hand, lack of coordination  from previous CVA    Occupational performance deficits (Please refer to evaluation for details):   ADL's;IADL's;Leisure    Rehab Potential  Good    Current Impairments/barriers affecting progress:  expressive aphasia, falls, neglect    OT Frequency  2x / week    OT Duration  12 weeks    OT Treatment/Interventions  Self-care/ADL training;Visual/perceptual remediation/compensation;DME and/or AE instruction;Patient/family education;Moist Heat;Therapeutic exercise;Manual Therapy;Therapeutic activities;Neuromuscular education    Consulted and Agree with Plan of Care  Patient    Family Member Consulted  wife, [name]       Patient will benefit from skilled therapeutic intervention in order to improve the following deficits and impairments:  Impaired vision/preception, Decreased coordination, Decreased safety awareness, Impaired UE functional use, Decreased strength  Visit Diagnosis: Muscle weakness (generalized)  Other lack of coordination  Neurologic neglect syndrome    Problem List Patient Active Problem List   Diagnosis Date Noted  . Sleep disturbance   . PAF (paroxysmal atrial fibrillation) ([place])   . Recurrent UTI   . Hypokalemia   . Hyponatremia   . Abnormal urine   . 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 Benign essential HTN   . Urinary retention   . History of gout   . Global aphasia   . SAH (subarachnoid hemorrhage) ([place])   . SDH (subdural hematoma) ([place])   . Lymphocytosis   . Subdural hemorrhage following injury ([place]) 05/12/2016   Guila Owensby T Elihue Ebert, OTR/L, CLT  Gerene Nedd 05/24/2018, 7:57 PM  [place] MAIN [facility] SERVICES [address] [place], [place], [address] Phone: [phone]   Fax:  [phone]  Name: James [name] MRN: [id number] Date of Birth: 22-Mar-1941

## 2018-05-28 ENCOUNTER — Encounter: Payer: Self-pay | Admitting: Physical Therapy

## 2018-05-28 ENCOUNTER — Ambulatory Visit: Payer: PPO

## 2018-05-28 ENCOUNTER — Ambulatory Visit: Payer: PPO | Attending: Neurology | Admitting: Occupational Therapy

## 2018-05-28 DIAGNOSIS — M6281 Muscle weakness (generalized): Secondary | ICD-10-CM | POA: Diagnosis not present

## 2018-05-28 DIAGNOSIS — R278 Other lack of coordination: Secondary | ICD-10-CM

## 2018-05-28 DIAGNOSIS — S065X9A Traumatic subdural hemorrhage with loss of consciousness of unspecified duration, initial encounter: Secondary | ICD-10-CM | POA: Insufficient documentation

## 2018-05-28 DIAGNOSIS — R262 Difficulty in walking, not elsewhere classified: Secondary | ICD-10-CM | POA: Insufficient documentation

## 2018-05-28 DIAGNOSIS — R2681 Unsteadiness on feet: Secondary | ICD-10-CM

## 2018-05-28 DIAGNOSIS — I609 Nontraumatic subarachnoid hemorrhage, unspecified: Secondary | ICD-10-CM | POA: Diagnosis not present

## 2018-05-28 DIAGNOSIS — R414 Neurologic neglect syndrome: Secondary | ICD-10-CM | POA: Diagnosis not present

## 2018-05-28 NOTE — Therapy (Signed)
[place] MAIN [facility] SERVICES [address] [place], [place], [address] Phone: [phone]   Fax:  [phone]  Physical Therapy Treatment  Patient Details  Name: James [name] MRN: [id number] Date of Birth: 12-02-1940 Referring Provider (PT): shah   Encounter Date: 05/28/2018  PT End of Session - 05/28/18 1019    Visit Number  25    Number of Visits  25    Date for PT Re-Evaluation  05/21/18    Authorization Type  6/10 starting on 05/09/18 progress note    PT Start Time  1015    PT Stop Time  1100    PT Time Calculation (min)  45 min    Equipment Utilized During Treatment  Gait belt    Activity Tolerance  Patient tolerated treatment well    Behavior During Therapy  WFL for tasks assessed/performed       Past Medical History:  Diagnosis Date  . Alcohol abuse, in remission   . Atrial fibrillation ([place])   . Bilateral renal masses   . Closed right ankle fracture   . 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 Gait disorder   . Gout   . Hypertension   . Seizure disorder ([place])   . 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     Past Surgical History:  Procedure Laterality Date  . CRANIECTOMY FOR DEPRESSED SKULL FRACTURE  2009   with MRSA infection  . CRANIOTOMY     X 2  . HERNIA REPAIR     during childhood  . ORIF ANKLE FRACTURE Left 2012    There were no vitals filed for this visit.  Subjective Assessment - 05/28/18 1019    Subjective  Patient denies any falls or pain at start of session.    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  He suffered another fall in 2017.  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    Patient Stated Goals  Patient wants to have a safer gait and not stagger or feel off balance.     Currently in Pain?   No/denies      Treatment: Octane fitness x 5 mins L 6 Four square with mod assist fwd/ bwd x 10 Step ups to 6 inch stool x 20  Stepping over 1/2 foam x 20  Side stepping in hall with close supervision/CGA 4 rounds  StandingMarchingwith 3 lbsx15 reps each leg, BUE support supervision for safety Leg press x 100 lbs x 20 x 2 Standinghip abduction and extension x15 bilaterally; LAQ x15 bilaterally;ankle DF x15 bilaterally;      PT Education - 05/28/18 1019    Education Details  therex form and technique    Person(s) Educated  Patient    Methods  Explanation;Demonstration;Tactile cues    Comprehension  Verbalized understanding;Need further instruction       PT Short Term Goals - 05/09/18 7371      PT SHORT TERM GOAL #1   Title  Patient will be independent in home exercise program to improve strength/mobility for better functional independence with ADLs.    Baseline  05/09/2018: Pt reports doing bed exercises    Time  6    Period  Weeks    Status  Partially Met    Target Date  04/09/18      PT SHORT TERM GOAL #2   Title  Patient (> 77 years old) will complete five times sit to stand test in < 15 seconds indicating an increased LE strength and improved balance.    Baseline  19.15 sec and needs CGA to min assist to keep from falling, 03/29/18=16.82 sec, 05/09/2018 15.23sec    Time  6    Period  Weeks    Status  Partially Met    Target Date  04/09/18        PT Long Term Goals - 05/09/18 0924      PT LONG TERM GOAL #1   Title  Patient will increase Berg Balance score by > 6 points to demonstrate decreased fall risk during functional activities.    Baseline  36/56 Merrilee Jansky,;  03/29/18 Berg = 36/52; 05/09/18= 45/56    Time  12    Period  Weeks    Status  Achieved    Target Date  05/21/18      PT LONG TERM GOAL #2   Title  Patient will reduce timed up and go to <11 seconds to reduce fall risk and demonstrate improved transfer/gait ability.    Baseline  15.18 sec with poor  safety judgement and uncontrolled sit, 03/29/18=13.02 sec, 05/09/2018 10.53 sec    Time  12    Period  Weeks    Status  Achieved    Target Date  05/21/18      PT LONG TERM GOAL #3   Title  Patient will increase dynamic gait index score to >19/24 as to demonstrate reduced fall risk and improved dynamic gait balance for better safety with community/home ambulation.     Baseline  16/24, 03/29/18=16/25; 05/09/18=17/24    Time  12    Period  Weeks    Status  Partially Met    Target Date  05/21/18      PT LONG TERM GOAL #4   Title  Patient will ascend and descend 4 stairs with single rail UE support using a step-through pattern with SBA in order to demonstrate improved balance, safety, and decrease risk of falls.    Baseline  05/09/18=CGA/Min A for safety, step-to pattern, LUE support,     Time  4    Period  Weeks    Status  New    Target Date  06/06/18            Plan - 05/28/18 1157    Clinical Impression Statement  Patient needed max to moderate tactile and verbal cues throughout session for safety as well proper exercise technique and form. 1-2 LOB noted during fwd and backward stepping with visual cues, most difficult activity during session with modAx1 from PT to maintain balance.     PT Frequency  2x / week    PT Duration  12 weeks    PT Treatment/Interventions  Therapeutic activities;Therapeutic exercise;Balance training;Neuromuscular re-education;Functional mobility training;Gait training;Manual techniques    PT Next Visit Plan  progress balance and HEP    PT Home Exercise Plan  heel raises, bridges    Consulted and Agree with Plan of Care  Patient       Patient will benefit from skilled therapeutic intervention in order to improve the following deficits and impairments:  Abnormal gait, Decreased coordination, Decreased activity tolerance, Decreased endurance, Decreased strength, Decreased balance, Decreased safety awareness, Difficulty walking  Visit Diagnosis: Muscle  weakness (generalized)  Other lack of coordination  Unsteadiness on feet  Difficulty  in walking, not elsewhere classified     Problem List Patient Active Problem List   Diagnosis Date Noted  . Sleep disturbance   . PAF (paroxysmal atrial fibrillation) ([place])   . Recurrent UTI   . Hypokalemia   . Hyponatremia   . Abnormal urine   . 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 Benign essential HTN   . Urinary retention   . History of gout   . Global aphasia   . SAH (subarachnoid hemorrhage) ([place])   . SDH (subdural hematoma) ([place])   . Lymphocytosis   . Subdural hemorrhage following injury [facility]) 05/12/2016   Lieutenant Diego PT, DPT [phone] PM,05/28/18 [place] MAIN [facility] SERVICES [address] [place], [place], [address] Phone: [phone]   Fax:  [phone]  Name: Glennon Kopko MRN: [id number] Date of Birth: 1941-05-19

## 2018-05-30 ENCOUNTER — Encounter: Payer: Self-pay | Admitting: Occupational Therapy

## 2018-05-30 ENCOUNTER — Encounter: Payer: Self-pay | Admitting: Physical Therapy

## 2018-05-30 ENCOUNTER — Ambulatory Visit: Payer: PPO | Admitting: Occupational Therapy

## 2018-05-30 ENCOUNTER — Ambulatory Visit: Payer: PPO | Admitting: Physical Therapy

## 2018-05-30 DIAGNOSIS — R414 Neurologic neglect syndrome: Secondary | ICD-10-CM

## 2018-05-30 DIAGNOSIS — M6281 Muscle weakness (generalized): Secondary | ICD-10-CM | POA: Diagnosis not present

## 2018-05-30 DIAGNOSIS — S065X9A Traumatic subdural hemorrhage with loss of consciousness of unspecified duration, initial encounter: Secondary | ICD-10-CM

## 2018-05-30 DIAGNOSIS — S065XAA Traumatic subdural hemorrhage with loss of consciousness status unknown, initial encounter: Secondary | ICD-10-CM

## 2018-05-30 DIAGNOSIS — R278 Other lack of coordination: Secondary | ICD-10-CM

## 2018-05-30 DIAGNOSIS — I609 Nontraumatic subarachnoid hemorrhage, unspecified: Secondary | ICD-10-CM

## 2018-05-30 DIAGNOSIS — R2681 Unsteadiness on feet: Secondary | ICD-10-CM

## 2018-05-30 DIAGNOSIS — R262 Difficulty in walking, not elsewhere classified: Secondary | ICD-10-CM

## 2018-05-30 NOTE — Therapy (Signed)
[place] MAIN [facility] SERVICES [address] [place], [place], [address] Phone: [phone]   Fax:  [phone]  Occupational Therapy Treatment  Patient Details  Name: James [name] MRN: [id number] Date of Birth: Jul 26, 1940 No data recorded  Encounter Date: 05/28/2018  OT End of Session - 05/30/18 2035    Visit Number  11    Number of Visits  55    Date for OT Re-Evaluation  06/08/18    Authorization Type  Medicare visit 8/10    OT Start Time  0930    OT Stop Time  1015    OT Time Calculation (min)  45 min    Activity Tolerance  Patient tolerated treatment well    Behavior During Therapy  [facility] for tasks assessed/performed       Past Medical History:  Diagnosis Date  . Alcohol abuse, in remission   . Atrial fibrillation ([place])   . Bilateral renal masses   . Closed right ankle fracture   . 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 Gait disorder   . Gout   . Hypertension   . Seizure disorder ([place])   . 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     Past Surgical History:  Procedure Laterality Date  . CRANIECTOMY FOR DEPRESSED SKULL FRACTURE  2009   with MRSA infection  . CRANIOTOMY     X 2  . HERNIA REPAIR     during childhood  . ORIF ANKLE FRACTURE Left 2012    There were no vitals filed for this visit.  Subjective Assessment - 05/30/18 2034    Subjective   Patient indicating during session some frustration over manipulation of smaller objects, 1/2 inch in size.     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  He suffered another fall in 2017.  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     Patient Stated Goals  to be able to use the right hand functionally    Currently in Pain?  No/denies    Multiple Pain Sites  No         Patient seen for design copy and use of Pvc pipe assembly, moderate cues to  utilize right hand primarily for task and to use left as an assist.   Manipulation of Small pegs unable to place into small grid and reports frustration  with task, if assist for pegs to place into board, he can remove with right hand with  cues for prehension patterns.                    OT Education - 05/30/18 2035    Education provided  Yes    Education Details  manipulation of small 1/2 inch sized objects    Person(s) Educated  Patient    Methods  Explanation;Demonstration;Verbal cues    Comprehension  Verbal cues required;Returned demonstration;Verbalized understanding          OT Long Term Goals - 05/14/18 0846      OT LONG TERM GOAL #1   Title  Patient will improve right hand function to hold utensil in right hand for self feeding with modified independence.     Baseline  unable to hold utensil at eval, now able to hold utensils and  self feed with minimal spillage.    Time  12    Period  Weeks    Status  Achieved      OT LONG TERM GOAL #2   Title  Pt will increase hand strength in right by 5# to be able to cut food with modified independence.    Baseline  unable at eval, able to cut meat with occasional assist at 30 visit.    Time  12    Period  Weeks    Status  Achieved      OT LONG TERM GOAL #3   Title  Patient will demonstrate increased awareness and strategies to attend to right side to hold items without dropping items.    Baseline  drops items frequently, 30 visit-patient dropping objects less frequently and is more aware when holding objects in hand    Time  12    Period  Weeks    Status  On-going      OT LONG TERM GOAL #4   Title  Patient will demonstrate independence in home exercise program for ROM, strength and coordination of right hand.    Baseline  no current program, modified independent with current program at 30 visit but continuing to add exercises.     Time  6    Period  Weeks    Status  On-going      OT LONG TERM GOAL #5   Title  Patient will demonstrate the ability to obtain items from right pocket 90% of the time.    Baseline  difficulty getting keys or coins out of right pocket, 30 visit update-able to demonstrate 75% of the time    Time  12    Period  Weeks    Status  On-going      OT LONG TERM GOAL #6   Title  Patient will demonstrate pouring a drink with the right hand with attention to right side and not knocking the cup over and spilling.      Baseline  patient often does not realize the cup is on his right side and knocks it over when attempting to get a drink    Time  12    Period  Weeks    Status  On-going            Plan - 05/30/18 2035    Clinical Impression Statement  Patient indicating frustration today with working on smaller items to manipulate, items less than 1/2 inch in size.  Cues today with reaching tasks to perform PVC assembly, patient tending to reach with left UE and requires cues on several occasions to lead today with right hand and use left as a helper.  He appeared so involved with pattern design he was not focused on use of right hand for task. Continues to demonstrate difficulty with manipulation of small 1/2 inch sized objects to pick up and turn item to place into small grid, he does better with removing items once they are placed.  Continue to work towards goals to improve right UE functional use for daily tasks.     Occupational Profile and client history currently impacting functional performance  repeated falls, TBI, expressive aphasia, limited motion in right hand, lack of coordination from previous CVA    Occupational performance deficits (Please refer to evaluation for details):  ADL's;IADL's;Leisure    Rehab Potential  Good    Current Impairments/barriers affecting progress:  expressive aphasia, falls, neglect    OT Frequency  2x /  week    OT Duration  12 weeks    OT  Treatment/Interventions  Self-care/ADL training;Visual/perceptual remediation/compensation;DME and/or AE instruction;Patient/family education;Moist Heat;Therapeutic exercise;Manual Therapy;Therapeutic activities;Neuromuscular education    Consulted and Agree with Plan of Care  Patient       Patient will benefit from skilled therapeutic intervention in order to improve the following deficits and impairments:  Impaired vision/preception, Decreased coordination, Decreased safety awareness, Impaired UE functional use, Decreased strength  Visit Diagnosis: Muscle weakness (generalized)  Other lack of coordination  Neurologic neglect syndrome    Problem List Patient Active Problem List   Diagnosis Date Noted  . Sleep disturbance   . PAF (paroxysmal atrial fibrillation) ([place])   . Recurrent UTI   . Hypokalemia   . Hyponatremia   . Abnormal urine   . 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 Benign essential HTN   . Urinary retention   . History of gout   . Global aphasia   . SAH (subarachnoid hemorrhage) ([place])   . SDH (subdural hematoma) ([place])   . Lymphocytosis   . Subdural hemorrhage following injury ([place]) 05/12/2016    Lovett,Amy 05/30/2018, 8:43 PM Amy Oneita Jolly, OTR/L, [place] MAIN [facility] SERVICES [address] [place], [place], [address] Phone: [phone]   Fax:  [phone]  Name: James [name] MRN: [id number] Date of Birth: [birth year]

## 2018-05-30 NOTE — Therapy (Addendum)
[place] MAIN [facility] SERVICES [address] [place], [place], [address] Phone: [phone]   Fax:  [phone]  Occupational Therapy Treatment  Patient Details  Name: James [name] MRN: [id number] Date of Birth: 06/03/1941 No data recorded  Encounter Date: 05/30/2018  OT End of Session - 05/30/18 2022    Visit Number  21    Number of Visits  30    Date for OT Re-Evaluation  06/08/18    Authorization Type  Medicare visit 9/10    OT Start Time  3810    OT Stop Time  1100    OT Time Calculation (min)  45 min    Activity Tolerance  Patient tolerated treatment well    Behavior During Therapy  [facility] for tasks assessed/performed       Past Medical History:  Diagnosis Date  . Alcohol abuse, in remission   . Atrial fibrillation ([place])   . Bilateral renal masses   . Closed right ankle fracture   . 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 Gait disorder   . Gout   . Hypertension   . Seizure disorder ([place])   . 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     Past Surgical History:  Procedure Laterality Date  . CRANIECTOMY FOR DEPRESSED SKULL FRACTURE  2009   with MRSA infection  . CRANIOTOMY     X 2  . HERNIA REPAIR     during childhood  . ORIF ANKLE FRACTURE Left 2012    There were no vitals filed for this visit.  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  He suffered another fall in 2017.  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     Patient Stated Goals  to be able to use the right hand functionally    Currently in Pain?  No/denies    Multiple Pain Sites  No            Patient seen for focus on Cards shuffling,  almost able to perform with right hand, he has difficulty with thumb pressure to  flip through cards, however, with cues and demonstration he improved to being able to partially perform task and will need additional focus to calibrate movements.  Patient seen for Flipping cards with thumb/finger combinations, cards towards him and cards flipped away.  Cues for patterns and to facilitate alternating patterns working on speed and dexterity.  Manipulation of Washers and dowels placed in magnetic bowl able to retrieve from magnetic bowl and place onto dowel sticks in elevated position to encourage reach.  He demos difficulty with removing washers from long dowel stick which requires balance of graded pressure of thumb and index finger.                 OT Education - 05/30/18 2021    Education provided  Yes    Education Details  use of cards at home for hand and manipulation skills    Person(s) Educated  Patient    Methods  Explanation;Demonstration;Verbal cues    Comprehension  Verbal cues required;Returned demonstration;Verbalized understanding          OT Long Term Goals - 05/14/18 0846      OT LONG TERM  GOAL #1   Title  Patient will improve right hand function to hold utensil in right hand for self feeding with modified independence.     Baseline  unable to hold utensil at eval, now able to hold utensils and self feed with minimal spillage.    Time  12    Period  Weeks    Status  Achieved      OT LONG TERM GOAL #2   Title  Pt will increase hand strength in right by 5# to be able to cut food with modified independence.    Baseline  unable at eval, able to cut meat with occasional assist at 30 visit.    Time  12    Period  Weeks    Status  Achieved      OT LONG TERM GOAL #3   Title  Patient will demonstrate increased awareness and strategies to attend to right side to hold items without dropping items.    Baseline  drops items frequently, 30 visit-patient dropping objects less frequently and is more aware when holding objects in hand    Time  12     Period  Weeks    Status  On-going      OT LONG TERM GOAL #4   Title  Patient will demonstrate independence in home exercise program for ROM, strength and coordination of right hand.    Baseline  no current program, modified independent with current program at 30 visit but continuing to add exercises.    Time  6    Period  Weeks    Status  On-going      OT LONG TERM GOAL #5   Title  Patient will demonstrate the ability to obtain items from right pocket 90% of the time.    Baseline  difficulty getting keys or coins out of right pocket, 30 visit update-able to demonstrate 75% of the time    Time  12    Period  Weeks    Status  On-going      OT LONG TERM GOAL #6   Title  Patient will demonstrate pouring a drink with the right hand with attention to right side and not knocking the cup over and spilling.      Baseline  patient often does not realize the cup is on his right side and knocks it over when attempting to get a drink    Time  12    Period  Weeks    Status  On-going            Plan - 05/30/18 2022    Clinical Impression Statement  Patient demonstrates difficulty with manipulation of cards to shuffle deck, with focus and cues, patient was able to grossly perform task but will need continued focus in this area to improve skill.  He plays cards with wife and friends at home and would like to be able to manage cards better.  Improvements shown with tip to tip prehension patterns to pick up washers from magnetic bowl but has difficulty with graded pressure in fingers to balance to remove washers from dowel sticks. Continue to work towards goals.     Occupational Profile and client history currently impacting functional performance  repeated falls, TBI, expressive aphasia, limited motion in right hand, lack of coordination from previous CVA    Occupational performance deficits (Please refer to evaluation for details):  ADL's;IADL's;Leisure    Rehab Potential  Good    Current  Impairments/barriers affecting progress:  expressive aphasia, falls, neglect    OT Frequency  2x / week    OT Duration  12 weeks    OT Treatment/Interventions  Self-care/ADL training;Visual/perceptual remediation/compensation;DME and/or AE instruction;Patient/family education;Moist Heat;Therapeutic exercise;Manual Therapy;Therapeutic activities;Neuromuscular education    Consulted and Agree with Plan of Care  Patient    Family Member Consulted  wife, [name]       Patient will benefit from skilled therapeutic intervention in order to improve the following deficits and impairments:  Impaired vision/preception, Decreased coordination, Decreased safety awareness, Impaired UE functional use, Decreased strength  Visit Diagnosis: Muscle weakness (generalized)  Other lack of coordination  Neurologic neglect syndrome    Problem List Patient Active Problem List   Diagnosis Date Noted  . Sleep disturbance   . PAF (paroxysmal atrial fibrillation) ([place])   . Recurrent UTI   . Hypokalemia   . Hyponatremia   . Abnormal urine   . 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 Benign essential HTN   . Urinary retention   . History of gout   . Global aphasia   . SAH (subarachnoid hemorrhage) ([place])   . SDH (subdural hematoma) ([place])   . Lymphocytosis   . Subdural hemorrhage following injury [facility]) 05/12/2016   Donovan Persley T Graycen Degan, OTR/L, CLT  Rushil Kimbrell 05/30/2018, 8:39 PM  [place] MAIN [facility] SERVICES [address] [place], [place], [address] Phone: [phone]   Fax:  [phone]  Name: James [name] MRN: [id number] Date of Birth: 04/03/1941

## 2018-05-30 NOTE — Therapy (Signed)
[place] MAIN [facility] SERVICES [address] [place], [place], [address] Phone: [phone]   Fax:  [phone]  Physical Therapy Treatment  Patient Details  Name: James [name] MRN: [id number] Date of Birth: 29-Nov-1940 Referring Provider (PT): shah   Encounter Date: 05/30/2018  PT End of Session - 05/30/18 0947    Visit Number  26    Number of Visits  34    Date for PT Re-Evaluation  08/13/18    Authorization Type  7/10 starting on 05/09/18 progress note    PT Start Time  0932    PT Stop Time  1014    PT Time Calculation (min)  42 min    Equipment Utilized During Treatment  Gait belt    Activity Tolerance  Patient tolerated treatment well    Behavior During Therapy  WFL for tasks assessed/performed       Past Medical History:  Diagnosis Date  . Alcohol abuse, in remission   . Atrial fibrillation ([place])   . Bilateral renal masses   . Closed right ankle fracture   . 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 Gait disorder   . Gout   . Hypertension   . Seizure disorder ([place])   . 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     Past Surgical History:  Procedure Laterality Date  . CRANIECTOMY FOR DEPRESSED SKULL FRACTURE  2009   with MRSA infection  . CRANIOTOMY     X 2  . HERNIA REPAIR     during childhood  . ORIF ANKLE FRACTURE Left 2012    There were no vitals filed for this visit.  Subjective Assessment - 05/30/18 0945    Subjective  Patient denies any falls or pain at start of session., n new concerns.    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  He suffered another fall in 2017.  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    Patient Stated Goals  Patient wants to have a safer gait  and not stagger or feel off balance.     Currently in Pain?  No/denies    Multiple Pain Sites  No       Neuromuscular Re-education  Side stepping on TM at . 3 miles/ hr and elevation 1 x 3 mins left and 3 mins right  Airex balance with toe taps to 6" step alternating LE x 10 each, faded UE support , continues to need UE support 75%  Static balance on 1/2 bolster (flat side up) with no UE support 30 s x 2   Rockerboard: Lateral weight shift x10 reps each direction, no UE support, CGA for safety with VCs to control the board tap in each direction and not letting it hit too hard   Feet apart on BOSU ball and head turns  x10 reps,  UE support, CGA for safety with VCs to utilize core  Airex pad  Head turns  x2 min, CGA for safety, demonstrated difficulty with catching the ball on its return and required VCs to control the speed of toss to control the return speed  Airex pad, balloon passes x2 min, supervision for safety with varying directions and speed of balloon, VCs for  utilizing both hands and minimizing UE support Agility ladder with Min assist  BOSU ball lunges with single UE support x 15 BLE , cues and modeling / demonstration for instructions    Matrix side stepping and bwd walking x 2 trials each way, very uncontrolled and poor balance strategies   CGA and Min to mod verbal cues used throughout with increased in postural sway and LOB most seen with narrow base of support and while on uneven surfaces. Continues to have balance deficits typical with diagnosis. Patient performs intermediate level exercises without pain behaviors and needs verbal cuing for postural alignment and head positioning                         PT Education - 05/30/18 0946    Education Details  therex form and technique    Person(s) Educated  Patient    Methods  Demonstration    Comprehension  Returned demonstration;Need further instruction       PT Short Term Goals -  05/09/18 9211      PT SHORT TERM GOAL #1   Title  Patient will be independent in home exercise program to improve strength/mobility for better functional independence with ADLs.    Baseline  05/09/2018: Pt reports doing bed exercises    Time  6    Period  Weeks    Status  Partially Met    Target Date  04/09/18      PT SHORT TERM GOAL #2   Title  Patient (> 81 years old) will complete five times sit to stand test in < 15 seconds indicating an increased LE strength and improved balance.    Baseline  19.15 sec and needs CGA to min assist to keep from falling, 03/29/18=16.82 sec, 05/09/2018 15.23sec    Time  6    Period  Weeks    Status  Partially Met    Target Date  04/09/18        PT Long Term Goals - 05/09/18 0924      PT LONG TERM GOAL #1   Title  Patient will increase Berg Balance score by > 6 points to demonstrate decreased fall risk during functional activities.    Baseline  36/56 Merrilee Jansky,;  03/29/18 Berg = 36/52; 05/09/18= 45/56    Time  12    Period  Weeks    Status  Achieved    Target Date  05/21/18      PT LONG TERM GOAL #2   Title  Patient will reduce timed up and go to <11 seconds to reduce fall risk and demonstrate improved transfer/gait ability.    Baseline  15.18 sec with poor safety judgement and uncontrolled sit, 03/29/18=13.02 sec, 05/09/2018 10.53 sec    Time  12    Period  Weeks    Status  Achieved    Target Date  05/21/18      PT LONG TERM GOAL #3   Title  Patient will increase dynamic gait index score to >19/24 as to demonstrate reduced fall risk and improved dynamic gait balance for better safety with community/home ambulation.     Baseline  16/24, 03/29/18=16/25; 05/09/18=17/24    Time  12    Period  Weeks    Status  Partially Met    Target Date  05/21/18      PT LONG TERM GOAL #4   Title  Patient will ascend and descend 4 stairs with single rail UE support using  a step-through pattern with SBA in order to demonstrate improved balance, safety, and decrease  risk of falls.    Baseline  05/09/18=CGA/Min A for safety, step-to pattern, LUE support,     Time  4    Period  Weeks    Status  New    Target Date  06/06/18            Plan - 05/30/18 0949    Clinical Impression Statement  Patient demonstrates understanding of HEP with moderate corrections needed. Patient challenged with sit to stand transfer with multiple repetitions due to fatigue. Weak LE combined with weak core musculature results in poor postural control and balance deficits. Patient will continue to benefit from skilled physical therapy to improve balance and mobility.    Rehab Potential  Good    PT Frequency  2x / week    PT Duration  12 weeks    PT Treatment/Interventions  Therapeutic activities;Therapeutic exercise;Balance training;Neuromuscular re-education;Functional mobility training;Gait training;Manual techniques    PT Next Visit Plan  progress balance and HEP    PT Home Exercise Plan  heel raises, bridges    Consulted and Agree with Plan of Care  Patient       Patient will benefit from skilled therapeutic intervention in order to improve the following deficits and impairments:  Abnormal gait, Decreased coordination, Decreased activity tolerance, Decreased endurance, Decreased strength, Decreased balance, Decreased safety awareness, Difficulty walking  Visit Diagnosis: Muscle weakness (generalized)  Other lack of coordination  Unsteadiness on feet  Difficulty in walking, not elsewhere classified  Neurologic neglect syndrome  SDH (subdural hematoma) ([facility])  SAH (subarachnoid hemorrhage) ([facility])     Problem List Patient Active Problem List   Diagnosis Date Noted  . Sleep disturbance   . PAF (paroxysmal atrial fibrillation) ([place])   . Recurrent UTI   . Hypokalemia   . Hyponatremia   . Abnormal urine   . 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 Benign essential HTN   . Urinary retention   . History of gout   . Global aphasia   . SAH  (subarachnoid hemorrhage) ([place])   . SDH (subdural hematoma) ([place])   . Lymphocytosis   . Subdural hemorrhage following injury ([place]) 05/12/2016    Alanson Puls, PT DPT 05/30/2018, 9:51 AM  [place] MAIN [facility] SERVICES [address] [place], [place], [address] Phone: [phone]   Fax:  [phone]  Name: James [name] MRN: [id number] Date of Birth: 21-Jun-1941

## 2018-06-04 ENCOUNTER — Ambulatory Visit: Payer: PPO | Admitting: Physical Therapy

## 2018-06-04 ENCOUNTER — Ambulatory Visit: Payer: PPO | Admitting: Occupational Therapy

## 2018-06-04 ENCOUNTER — Encounter: Payer: Self-pay | Admitting: Physical Therapy

## 2018-06-04 DIAGNOSIS — M6281 Muscle weakness (generalized): Secondary | ICD-10-CM | POA: Diagnosis not present

## 2018-06-04 DIAGNOSIS — R414 Neurologic neglect syndrome: Secondary | ICD-10-CM

## 2018-06-04 DIAGNOSIS — R262 Difficulty in walking, not elsewhere classified: Secondary | ICD-10-CM

## 2018-06-04 DIAGNOSIS — R2681 Unsteadiness on feet: Secondary | ICD-10-CM

## 2018-06-04 DIAGNOSIS — R278 Other lack of coordination: Secondary | ICD-10-CM

## 2018-06-04 NOTE — Therapy (Signed)
[place] MAIN [facility] SERVICES [address] [place], [place], [address] Phone: [phone]   Fax:  [phone]  Physical Therapy Treatment  Patient Details  Name: James [name] MRN: [id number] Date of Birth: 05-12-1941 Referring Provider (PT): shah   Encounter Date: 06/04/2018  PT End of Session - 06/04/18 0931    Visit Number  27    Number of Visits  31    Date for PT Re-Evaluation  08/13/18    Authorization Type  8/10 starting on 05/09/18 progress note    PT Start Time  0917    PT Stop Time  1000    PT Time Calculation (min)  43 min    Equipment Utilized During Treatment  Gait belt    Activity Tolerance  Patient tolerated treatment well    Behavior During Therapy  [facility] for tasks assessed/performed       Past Medical History:  Diagnosis Date  . Alcohol abuse, in remission   . Atrial fibrillation ([place])   . Bilateral renal masses   . Closed right ankle fracture   . 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 Gait disorder   . Gout   . Hypertension   . Seizure disorder ([place])   . 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     Past Surgical History:  Procedure Laterality Date  . CRANIECTOMY FOR DEPRESSED SKULL FRACTURE  2009   with MRSA infection  . CRANIOTOMY     X 2  . HERNIA REPAIR     during childhood  . ORIF ANKLE FRACTURE Left 2012    There were no vitals filed for this visit.  Subjective Assessment - 06/04/18 0929    Subjective  Patient denies any falls or pain at start of session., n new concerns.    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  He suffered another fall in 2017.  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    Patient Stated Goals  Patient wants to have a safer gait  and not stagger or feel off balance.     Currently in Pain?  No/denies    Multiple Pain Sites  No       NEUROMUSCULAR RE-EDUCATION   Airex cone  reaching crossing midline cues for looking up    Toe tapping 6 inch stool without UE assist x 20 , cues for technique  Toe tapping from purple foam to  6 inch stool without UE assist x 20 , cues for technique   Side stepping on blue  foam balance beam x 5 lengths of the parallel bars with posture correction cues   4 square fwd/bwd, side to side stepping/ diagonal stepping, cues to not step on the lines  Purple foam   and trunk rotation with yellow thera ball , fwd/bwd, and lateral shifts  with 50% UE support x 2 mins  Tandem standing on 1/2 foam with flat side up and cone stacking on stool with need of min assist x 20 cones x 2 sets      Ther-ex  Octane fitness  L6 x 5 minutes   Standing marching with 3# ankle weights x 15; Standing hip extension with 3# ankle weights x 15; Standing  hip abduction with 3# ankle weights x 15;  Leg press 100 lbs x 20 x 2    Pt educated throughout session about proper posture and technique with exercises. Improved exercise technique, movement at target joints, use of target muscles after min to mod verbal, visual, tactile cues.                      PT Education - 06/04/18 0929    Education Details  HEP, safety    Person(s) Educated  Patient    Methods  Explanation    Comprehension  Returned demonstration;Need further instruction       PT Short Term Goals - 05/09/18 0922      PT SHORT TERM GOAL #1   Title  Patient will be independent in home exercise program to improve strength/mobility for better functional independence with ADLs.    Baseline  05/09/2018: Pt reports doing bed exercises    Time  6    Period  Weeks    Status  Partially Met    Target Date  04/09/18      PT SHORT TERM GOAL #2   Title  Patient (> 60 years old) will complete five times sit to stand test in < 15  seconds indicating an increased LE strength and improved balance.    Baseline  19.15 sec and needs CGA to min assist to keep from falling, 03/29/18=16.82 sec, 05/09/2018 15.23sec    Time  6    Period  Weeks    Status  Partially Met    Target Date  04/09/18        PT Long Term Goals - 05/09/18 0924      PT LONG TERM GOAL #1   Title  Patient will increase Berg Balance score by > 6 points to demonstrate decreased fall risk during functional activities.    Baseline  36/56 Berg,;  03/29/18 Berg = 36/52; 05/09/18= 45/56    Time  12    Period  Weeks    Status  Achieved    Target Date  05/21/18      PT LONG TERM GOAL #2   Title  Patient will reduce timed up and go to <11 seconds to reduce fall risk and demonstrate improved transfer/gait ability.    Baseline  15.18 sec with poor safety judgement and uncontrolled sit, 03/29/18=13.02 sec, 05/09/2018 10.53 sec    Time  12    Period  Weeks    Status  Achieved    Target Date  05/21/18      PT LONG TERM GOAL #3   Title  Patient will increase dynamic gait index score to >19/24 as to demonstrate reduced fall risk and improved dynamic gait balance for better safety with community/home ambulation.     Baseline  16/24, 03/29/18=16/25; 05/09/18=17/24    Time  12    Period  Weeks    Status  Partially Met    Target Date  05/21/18      PT LONG TERM GOAL #4   Title  Patient will ascend and descend 4 stairs with single rail UE support using a step-through pattern with SBA in order to demonstrate improved balance, safety, and decrease risk of falls.    Baseline  05/09/18=CGA/Min A for safety, step-to pattern, LUE support,     Time  4    Period  Weeks    Status  New    Target Date  06/06/18              Plan - 06/04/18 0934    Clinical Impression Statement  Dynamic and static balance interventions continued today, with a focus on unilateral LE stability, slowing down and safety.  Pt tolerated all exercises well.  Intermediate dynamic standing balance  tasks were progressed with cga needed for reaching outside of base of support. Patient demonstrates understanding of HEP with min corrections needed. Patient challenged closed chain and open chain exercises with multiple repetitions due to fatigue. Weak LE combined with weak core musculature results in fair postural control and balance deficits. Patient will be DC today after education in HEP.     Rehab Potential  Good    PT Frequency  2x / week    PT Duration  12 weeks    PT Treatment/Interventions  Therapeutic activities;Therapeutic exercise;Balance training;Neuromuscular re-education;Functional mobility training;Gait training;Manual techniques    PT Next Visit Plan  progress balance and HEP    PT Home Exercise Plan  heel raises, bridges    Consulted and Agree with Plan of Care  Patient       Patient will benefit from skilled therapeutic intervention in order to improve the following deficits and impairments:  Abnormal gait, Decreased coordination, Decreased activity tolerance, Decreased endurance, Decreased strength, Decreased balance, Decreased safety awareness, Difficulty walking  Visit Diagnosis: Muscle weakness (generalized)  Other lack of coordination  Neurologic neglect syndrome  Unsteadiness on feet  Difficulty in walking, not elsewhere classified     Problem List Patient Active Problem List   Diagnosis Date Noted  . Sleep disturbance   . PAF (paroxysmal atrial fibrillation) ([place])   . Recurrent UTI   . Hypokalemia   . Hyponatremia   . Abnormal urine   . 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 Benign essential HTN   . Urinary retention   . History of gout   . Global aphasia   . SAH (subarachnoid hemorrhage) ([place])   . SDH (subdural hematoma) ([place])   . Lymphocytosis   . Subdural hemorrhage following injury ([place]) 05/12/2016    Alanson Puls, PT DPT 06/04/2018, 9:37 AM  [place] MAIN [facility]  SERVICES [address] [place], [place], [address] Phone: [phone]   Fax:  [phone]  Name: James [name] MRN: [id number] Date of Birth: [birth year]

## 2018-06-05 ENCOUNTER — Encounter: Payer: Self-pay | Admitting: Occupational Therapy

## 2018-06-05 NOTE — Therapy (Addendum)
[place] MAIN [facility] SERVICES [address] [place], [place], [address] Phone: [phone]   Fax:  [phone]  Occupational Therapy Treatment/Progress Note Update Reporting period from 04/17/2018 to 06/04/2018   Patient Details  Name: James [name] MRN: [id number] Date of Birth: 1940-08-10 No data recorded  Encounter Date: 06/04/2018  OT End of Session - 06/05/18 1642    Visit Number  51    Number of Visits  68    Date for OT Re-Evaluation  06/08/18    Authorization Type  Medicare visit 10/10    OT Start Time  0828    OT Stop Time  0915    OT Time Calculation (min)  47 min    Activity Tolerance  Patient tolerated treatment well    Behavior During Therapy  [facility] for tasks assessed/performed       Past Medical History:  Diagnosis Date  . Alcohol abuse, in remission   . Atrial fibrillation ([place])   . Bilateral renal masses   . Closed right ankle fracture   . 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 Gait disorder   . Gout   . Hypertension   . Seizure disorder ([place])   . 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     Past Surgical History:  Procedure Laterality Date  . CRANIECTOMY FOR DEPRESSED SKULL FRACTURE  2009   with MRSA infection  . CRANIOTOMY     X 2  . HERNIA REPAIR     during childhood  . ORIF ANKLE FRACTURE Left 2012    There were no vitals filed for this visit.  Subjective Assessment - 06/05/18 1640    Subjective   Patient reports he is doing well.  Discussed reassessment for next session and he will reflect on the next few days regarding tasks that have gotten better and anything that he would like to focus on in the coming weeks.     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  He suffered another fall in 2017.  Since 2005 he has  suffered from expressive aphasia.     Patient Stated Goals  to be able to use the right hand functionally    Currently in Pain?  No/denies    Multiple Pain Sites  No          Patient seen for finger strengthening tasks with use of Push pins and moderate resistance bulletin board.  Patient has some difficulty with picking up the push pins from the table and turning to place into the board.   If handed the pin, he is able to get the pin started in the board but requires cues to go back and make sure  the pins are pushed all the way down.  Patient has less difficulty with pulling the pins to remove.  Alternates  between a lateral pinch and tip to tip pinch.  Patient seen for manipulation of Glass beads, some with flat side up and others flat side down, he demonstrates  difficulty with picking up when the flat side is down. Multiple attempts for ones with flat side down.  Continues to  drop items occasionally but appears more aware when he is storing one in his hand than in the past.  OT Education - 06/05/18 1640    Education provided  Yes    Education Details  strength and coordination skills with right hand    Person(s) Educated  Patient    Methods  Explanation;Demonstration;Verbal cues    Comprehension  Verbal cues required;Returned demonstration;Verbalized understanding          OT Long Term Goals - 06/04/18 0846      OT LONG TERM GOAL #1   Title  Patient will improve right hand function to hold utensil in right hand for self feeding with modified independence.     Baseline  unable to hold utensil at eval, now able to hold utensils and self feed with minimal spillage.    Time  12    Period  Weeks    Status  Achieved      OT LONG TERM GOAL #2   Title  Pt will increase hand strength in right by 5# to be able to cut food with modified independence.    Baseline  unable at eval, able to cut meat with occasional assist at 30 visit.    Time  12     Period  Weeks    Status  Achieved      OT LONG TERM GOAL #3   Title  Patient will demonstrate increased awareness and strategies to attend to right side to hold items without dropping items.    Baseline  drops items frequently, 30 visit-patient dropping objects less frequently and is more aware when holding objects in hand    Time  12    Period  Weeks    Status  On-going      OT LONG TERM GOAL #4   Title  Patient will demonstrate independence in home exercise program for ROM, strength and coordination of right hand.    Baseline  no current program, modified independent with current program at 30 visit but continuing to add exercises.    Time  6    Period  Weeks    Status  On-going      OT LONG TERM GOAL #5   Title  Patient will demonstrate the ability to obtain items from right pocket 90% of the time.    Baseline  difficulty getting keys or coins out of right pocket, 30 visit update-able to demonstrate 75% of the time    Time  12    Period  Weeks    Status  On-going      OT LONG TERM GOAL #6   Title  Patient will demonstrate pouring a drink with the right hand with attention to right side and not knocking the cup over and spilling.      Baseline  patient often does not realize the cup is on his right side and knocks it over when attempting to get a drink    Time  12    Period  Weeks    Status  On-going            Plan - 06/05/18 1642    Clinical Impression Statement  Will plan to reassess patient skills next session and update goals for reassessment.  Patient has made good progress with his right hand especially with modulation of graded pressure and fine motor coordination skills.  He has advanced to working towards  inch sized objects for picking up and manipulating, performance depends on size and shape of object.  He continues to have difficulty with picking up items from the table which require turning or  manipulation to put into a slot or specific place. Continue to work  towards goals to increase independence in daily task with use of right dominant hand.  He continues to benefit from skilled OT services to address right neglect and right sided hand function.   Occupational Profile and client history currently impacting functional performance  repeated falls, TBI, expressive aphasia, limited motion in right hand, lack of coordination from previous CVA    Occupational performance deficits (Please refer to evaluation for details):  ADL's;IADL's;Leisure    Rehab Potential  Good    Current Impairments/barriers affecting progress:  expressive aphasia, falls, neglect    OT Frequency  2x / week    OT Duration  12 weeks    OT Treatment/Interventions  Self-care/ADL training;Visual/perceptual remediation/compensation;DME and/or AE instruction;Patient/family education;Moist Heat;Therapeutic exercise;Manual Therapy;Therapeutic activities;Neuromuscular education    Consulted and Agree with Plan of Care  Patient    Family Member Consulted  wife, [name]       Patient will benefit from skilled therapeutic intervention in order to improve the following deficits and impairments:  Impaired vision/preception, Decreased coordination, Decreased safety awareness, Impaired UE functional use, Decreased strength  Visit Diagnosis: Muscle weakness (generalized)  Other lack of coordination  Neurologic neglect syndrome    Problem List Patient Active Problem List   Diagnosis Date Noted  . Sleep disturbance   . PAF (paroxysmal atrial fibrillation) ([place])   . Recurrent UTI   . Hypokalemia   . Hyponatremia   . Abnormal urine   . 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 Benign essential HTN   . Urinary retention   . History of gout   . Global aphasia   . SAH (subarachnoid hemorrhage) ([place])   . SDH (subdural hematoma) ([place])   . Lymphocytosis   . Subdural hemorrhage following injury [facility]) 05/12/2016   Sammantha Mehlhaff T Eastyn Skalla, OTR/L, CLT  Faylynn Stamos 06/05/2018, 4:44  PM  [place] MAIN [facility] SERVICES [address] [place], [place], [address] Phone: [phone]   Fax:  [phone]  Name: James [name] MRN: [id number] Date of Birth: 1941-03-22

## 2018-06-06 ENCOUNTER — Encounter: Payer: Self-pay | Admitting: Physical Therapy

## 2018-06-06 ENCOUNTER — Ambulatory Visit: Payer: PPO | Admitting: Physical Therapy

## 2018-06-06 ENCOUNTER — Ambulatory Visit: Payer: PPO | Admitting: Occupational Therapy

## 2018-06-06 DIAGNOSIS — M6281 Muscle weakness (generalized): Secondary | ICD-10-CM | POA: Diagnosis not present

## 2018-06-06 DIAGNOSIS — R262 Difficulty in walking, not elsewhere classified: Secondary | ICD-10-CM

## 2018-06-06 DIAGNOSIS — R278 Other lack of coordination: Secondary | ICD-10-CM

## 2018-06-06 DIAGNOSIS — S065XAA Traumatic subdural hemorrhage with loss of consciousness status unknown, initial encounter: Secondary | ICD-10-CM

## 2018-06-06 DIAGNOSIS — R2681 Unsteadiness on feet: Secondary | ICD-10-CM

## 2018-06-06 DIAGNOSIS — I609 Nontraumatic subarachnoid hemorrhage, unspecified: Secondary | ICD-10-CM

## 2018-06-06 DIAGNOSIS — S065X9A Traumatic subdural hemorrhage with loss of consciousness of unspecified duration, initial encounter: Secondary | ICD-10-CM

## 2018-06-06 DIAGNOSIS — R414 Neurologic neglect syndrome: Secondary | ICD-10-CM

## 2018-06-06 NOTE — Therapy (Signed)
[place] MAIN [facility] SERVICES [address] [place], [place], [address] Phone: [phone]   Fax:  [phone]  Physical Therapy Treatment  Patient Details  Name: James [name] MRN: [id number] Date of Birth: 1941-02-08 Referring Provider (PT): shah   Encounter Date: 06/06/2018  PT End of Session - 06/06/18 0939    Visit Number  28    Number of Visits  56    Date for PT Re-Evaluation  08/13/18    Authorization Type  9/10 starting on 05/09/18 progress note    PT Start Time  0917    PT Stop Time  1000    PT Time Calculation (min)  43 min    Equipment Utilized During Treatment  Gait belt    Activity Tolerance  Patient tolerated treatment well    Behavior During Therapy  [facility] for tasks assessed/performed       Past Medical History:  Diagnosis Date  . Alcohol abuse, in remission   . Atrial fibrillation ([place])   . Bilateral renal masses   . Closed right ankle fracture   . 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 Gait disorder   . Gout   . Hypertension   . Seizure disorder ([place])   . 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     Past Surgical History:  Procedure Laterality Date  . CRANIECTOMY FOR DEPRESSED SKULL FRACTURE  2009   with MRSA infection  . CRANIOTOMY     X 2  . HERNIA REPAIR     during childhood  . ORIF ANKLE FRACTURE Left 2012    There were no vitals filed for this visit.  Subjective Assessment - 06/06/18 0938    Subjective  Patient denies any falls or pain at start of session., n new concerns.    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  He suffered another fall in 2017.  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    Patient Stated Goals  Patient wants to have a safer gait  and not stagger or feel off balance.     Currently in Pain?  No/denies    Multiple Pain Sites  No         Therapeutic exercise:   Octane fitness level 6 x 5 mins    Standing on 1/2 bolster (Flat side up): Heel/toe rock 2x10 with finger tip hold for balance with cues to keep knee straight for better ankle strategies;   Stepping over 2 hurdle fwd side to side x 10   Stepping over 1 hurdle bwd x10 cues for posture, increased hip flexion   Standing hip abd with 3 lbs 2x15 bilaterally   Standing hip extension with 3 lbs 2x15 BLE    Standing hip abd with 3# ankle weight x 15 BLE and  BUE support   Standing hip extension with 3# ankle weight x 10 BLE with BUE support   Standing hip flexion marches with 3# ankle weight x 10 BLE with BUE support   Standing HS curls with 3# ankle weight x 10 BLE with BUE support   Standing heel raises x 15 with 3# ankle weights   LAQ with 3# ankle weights with 3 second holds x10 each  LE   STS from chair without UE support 2x10   Mini squats x10 with no UE support      Cues for core activation of TA in standing to reduce back pain and for improved posture. Patient needed several sitting rest breaks during session.                       PT Education - 06/06/18 0938    Education Details  hep    Person(s) Educated  Patient    Methods  Explanation;Tactile cues;Verbal cues    Comprehension  Returned demonstration;Need further instruction       PT Short Term Goals - 05/09/18 6712      PT SHORT TERM GOAL #1   Title  Patient will be independent in home exercise program to improve strength/mobility for better functional independence with ADLs.    Baseline  05/09/2018: Pt reports doing bed exercises    Time  6    Period  Weeks    Status  Partially Met    Target Date  04/09/18      PT SHORT TERM GOAL #2   Title  Patient (> 28 years old) will complete five times sit to stand test in < 15 seconds indicating an increased LE  strength and improved balance.    Baseline  19.15 sec and needs CGA to min assist to keep from falling, 03/29/18=16.82 sec, 05/09/2018 15.23sec    Time  6    Period  Weeks    Status  Partially Met    Target Date  04/09/18        PT Long Term Goals - 05/09/18 0924      PT LONG TERM GOAL #1   Title  Patient will increase Berg Balance score by > 6 points to demonstrate decreased fall risk during functional activities.    Baseline  36/56 Merrilee Jansky,;  03/29/18 Berg = 36/52; 05/09/18= 45/56    Time  12    Period  Weeks    Status  Achieved    Target Date  05/21/18      PT LONG TERM GOAL #2   Title  Patient will reduce timed up and go to <11 seconds to reduce fall risk and demonstrate improved transfer/gait ability.    Baseline  15.18 sec with poor safety judgement and uncontrolled sit, 03/29/18=13.02 sec, 05/09/2018 10.53 sec    Time  12    Period  Weeks    Status  Achieved    Target Date  05/21/18      PT LONG TERM GOAL #3   Title  Patient will increase dynamic gait index score to >19/24 as to demonstrate reduced fall risk and improved dynamic gait balance for better safety with community/home ambulation.     Baseline  16/24, 03/29/18=16/25; 05/09/18=17/24    Time  12    Period  Weeks    Status  Partially Met    Target Date  05/21/18      PT LONG TERM GOAL #4   Title  Patient will ascend and descend 4 stairs with single rail UE support using a step-through pattern with SBA in order to demonstrate improved balance, safety, and decrease risk of falls.    Baseline  05/09/18=CGA/Min A for safety, step-to pattern, LUE support,     Time  4    Period  Weeks    Status  New    Target Date  06/06/18  Plan - 06/06/18 0940    Clinical Impression Statement  Pt requires direction and verbal cues for correct performance of exercises. Patient demonstrates weakness in BLE and performs open and closed chain exercises with no reports of pain. Pt was able to perform all exercises with min  assist and VC for technique. Patient struggles with control during movement as well as leg position with unstable surfaces.  Pt encouraged continuing HEP .Follow-up as scheduled.    Rehab Potential  Good    PT Frequency  2x / week    PT Duration  12 weeks    PT Treatment/Interventions  Therapeutic activities;Therapeutic exercise;Balance training;Neuromuscular re-education;Functional mobility training;Gait training;Manual techniques    PT Next Visit Plan  progress balance and HEP    PT Home Exercise Plan  heel raises, bridges    Consulted and Agree with Plan of Care  Patient       Patient will benefit from skilled therapeutic intervention in order to improve the following deficits and impairments:  Abnormal gait, Decreased coordination, Decreased activity tolerance, Decreased endurance, Decreased strength, Decreased balance, Decreased safety awareness, Difficulty walking  Visit Diagnosis: Muscle weakness (generalized)  Other lack of coordination  Neurologic neglect syndrome  Unsteadiness on feet  Difficulty in walking, not elsewhere classified  SAH (subarachnoid hemorrhage) (H[facility])  SDH (subdural hematoma) (H[facility])     Problem List Patient Active Problem List   Diagnosis Date Noted  . Sleep disturbance   . PAF (paroxysmal atrial fibrillation) ([place])   . Recurrent UTI   . Hypokalemia   . Hyponatremia   . Abnormal urine   . 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 Benign essential HTN   . Urinary retention   . History of gout   . Global aphasia   . SAH (subarachnoid hemorrhage) ([place])   . SDH (subdural hematoma) ([place])   . Lymphocytosis   . Subdural hemorrhage following injury ([place]) 05/12/2016    Alanson Puls, PT DPT 06/06/2018, 9:43 AM  [place] MAIN [facility] SERVICES [address] [place], [place], [address] Phone: [phone]   Fax:  [phone]  Name: James [name] MRN: [id number] Date of Birth:  13-Feb-1941

## 2018-06-08 DIAGNOSIS — E7849 Other hyperlipidemia: Secondary | ICD-10-CM | POA: Diagnosis not present

## 2018-06-08 DIAGNOSIS — I4819 Other persistent atrial fibrillation: Secondary | ICD-10-CM | POA: Diagnosis not present

## 2018-06-08 DIAGNOSIS — E538 Deficiency of other specified B group vitamins: Secondary | ICD-10-CM | POA: Diagnosis not present

## 2018-06-08 DIAGNOSIS — I2510 Atherosclerotic heart disease of native coronary artery without angina pectoris: Secondary | ICD-10-CM | POA: Diagnosis not present

## 2018-06-08 DIAGNOSIS — Z125 Encounter for screening for malignant neoplasm of prostate: Secondary | ICD-10-CM | POA: Diagnosis not present

## 2018-06-08 DIAGNOSIS — M1A09X0 Idiopathic chronic gout, multiple sites, without tophus (tophi): Secondary | ICD-10-CM | POA: Diagnosis not present

## 2018-06-08 DIAGNOSIS — I10 Essential (primary) hypertension: Secondary | ICD-10-CM | POA: Diagnosis not present

## 2018-06-11 ENCOUNTER — Encounter: Payer: PPO | Admitting: Occupational Therapy

## 2018-06-11 ENCOUNTER — Ambulatory Visit: Payer: PPO | Admitting: Physical Therapy

## 2018-06-12 ENCOUNTER — Encounter: Payer: Self-pay | Admitting: Occupational Therapy

## 2018-06-12 NOTE — Therapy (Signed)
[place] MAIN [facility] SERVICES [address] [place], [place], [address] Phone: [phone]   Fax:  [phone]  Occupational Therapy Treatment/Recertification   Patient Details  Name: James [name] MRN: [id number] Date of Birth: December 29, 1940 No data recorded  Encounter Date: 06/06/2018  OT End of Session - 06/12/18 1836    Visit Number  71   Number of Visits  105    Date for OT Re-Evaluation  08/29/2018   Authorization Type  Medicare visit 1/10    OT Start Time  1000    OT Stop Time  1046    OT Time Calculation (min)  46 min    Activity Tolerance  Patient tolerated treatment well    Behavior During Therapy  [facility] for tasks assessed/performed       Past Medical History:  Diagnosis Date  . Alcohol abuse, in remission   . Atrial fibrillation ([place])   . Bilateral renal masses   . Closed right ankle fracture   . 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 Gait disorder   . Gout   . Hypertension   . Seizure disorder ([place])   . 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     Past Surgical History:  Procedure Laterality Date  . CRANIECTOMY FOR DEPRESSED SKULL FRACTURE  2009   with MRSA infection  . CRANIOTOMY     X 2  . HERNIA REPAIR     during childhood  . ORIF ANKLE FRACTURE Left 2012    There were no vitals filed for this visit.  Subjective Assessment - 06/12/18 1836    Subjective   Patient denies any pain, indicates he feels therapy is helping but would like to try to decrease to one time a week and continue with goals not met.     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  He suffered another fall in 2017.  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     Patient Stated Goals  to be able to use  the right hand functionally    Currently in Pain?  No/denies    Multiple Pain Sites  No          Reassessment of physical performance skills as follows:  Grip strength 45# right. Pinch skills on right, lateral 15#, 3 point pinch 15#, 2 point pinch 14#.   Reaching tasks in multi directions to obtain larger items from various height shelves with right hand without dropping items.    Neuro: 9 hole peg test, moderate difficulty this date picking up and turning pegs to place into board but was able to complete task with increased time allowed:  8 min 25 sec.  Patient seen for manipulation of coins from tabletop to place into a resistive bank with cues for prehension patterns and pressure in right hand.  ADL:  Patient and wife report difficulty at times with managing utensils, spillage at times when using utensil in right hand,  Demonstrates mild difficulty at times with cutting meat and veggies. Patient able to demonstrate holding cup in right hand without spilling For short periods of time.  Patient still occasionally forgets he is holding the item on the right and will spill cup.  Handwriting improving but still has decreased legibility.                   OT Education - 06/12/18 1836    Education provided  Yes    Education Details  POC, goals, progress    Person(s) Educated  Patient;Spouse    Methods  Explanation    Comprehension  Verbalized understanding          OT Long Term Goals - 06/12/18 1838      OT LONG TERM GOAL #1   Title  Patient will demonstrate use of utensil in right hand for self feeding with modified independence with little to no spillage.     Baseline  still has diff with spillage and managing utensils at times.    Time  12    Period  Weeks    Status  New      OT LONG TERM GOAL #2   Title  Pt will increase hand strength in right by 5# to be able to cut food with modified independence.    Baseline  unable at eval, able to cut meat with  occasional assist at 30 visit.    Time  12    Period  Weeks    Status  Achieved      OT LONG TERM GOAL #3   Title  Patient will demonstrate increased awareness and strategies to attend to right side to hold items without dropping items.    Baseline  drops items frequently, 30 visit-patient dropping objects less frequently and is more aware when holding objects in hand    Time  12    Period  Weeks    Status  On-going      OT LONG TERM GOAL #4   Title  Patient will demonstrate independence in home exercise program for ROM, strength and coordination of right hand.    Baseline  no current program, modified independent with current program at 30 visit but continuing to add exercises.    Time  6    Period  Weeks    Status  On-going      OT LONG TERM GOAL #5   Title  Patient will demonstrate the ability to obtain items from right pocket 90% of the time.    Baseline  difficulty getting keys or coins out of right pocket, 30 visit update-able to demonstrate 75% of the time    Time  12    Period  Weeks    Status  Achieved      OT LONG TERM GOAL #6   Title  Patient will demonstrate pouring a drink with the right hand with attention to right side and not knocking the cup over and spilling.      Baseline  patient often does not realize the cup is on his right side and knocks it over when attempting to get a drink    Time  12    Period  Weeks    Status  On-going            Plan - 06/12/18 1837    Clinical Impression Statement  Patient has continued to make excellent progress with his right UE.  He has progressed to being able to demonstrate picking up smaller items up to  inch in size.  Still has difficulty with items  inch in size or smaller.  He has difficulty with manipulation skills to turn or flip items to opposite side.  Drops items frequently but not  as often as in the past.  Wife reports he has improved with self-feeding skills and use of utensils, he continues to demonstrate some  difficulty with use of fork and knife in combination to cut food and then take a bite.  Will revisit this skill and address ways to improve independence.  Patient is able to consistently retrieve items from his right pocket with some modifications in place.  He tends to use coin purse in the pocket rather than loose coins. Patient continues to benefit from skilled OT services to address his right sided neglect, right hand fine motor skills and modulation of graded pressure activities.  Will plan to continue one time a week to work towards goals and impairments listed above to increase independence in daily tasks.     Occupational Profile and client history currently impacting functional performance  repeated falls, TBI, expressive aphasia, limited motion in right hand, lack of coordination from previous CVA    Occupational performance deficits (Please refer to evaluation for details):  ADL's;IADL's;Leisure    Rehab Potential  Good    Current Impairments/barriers affecting progress:  expressive aphasia, falls, neglect    OT Frequency  1x / week    OT Duration  12 weeks    OT Treatment/Interventions  Self-care/ADL training;Visual/perceptual remediation/compensation;DME and/or AE instruction;Patient/family education;Moist Heat;Therapeutic exercise;Manual Therapy;Therapeutic activities;Neuromuscular education    Consulted and Agree with Plan of Care  Patient    Family Member Consulted  wife, [name]       Patient will benefit from skilled therapeutic intervention in order to improve the following deficits and impairments:  Impaired vision/preception, Decreased coordination, Decreased safety awareness, Impaired UE functional use, Decreased strength  Visit Diagnosis: Muscle weakness (generalized)  Other lack of coordination  Neurologic neglect syndrome    Problem List Patient Active Problem List   Diagnosis Date Noted  . Sleep disturbance   . PAF (paroxysmal atrial fibrillation) ([place])   .  Recurrent UTI   . Hypokalemia   . Hyponatremia   . Abnormal urine   . 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 Benign essential HTN   . Urinary retention   . History of gout   . Global aphasia   . SAH (subarachnoid hemorrhage) ([place])   . SDH (subdural hematoma) ([place])   . Lymphocytosis   . Subdural hemorrhage following injury ([place]) 05/12/2016   Saivion Goettel T Kenetra Hildenbrand, OTR/L, CLT  Daleon Willinger 06/12/2018, 7:01 PM  [place] MAIN [facility] SERVICES [address] [place], [place], [address] Phone: [phone]   Fax:  [phone]  Name: Chisum Habenicht MRN: [id number] Date of Birth: July 13, 1941

## 2018-06-13 ENCOUNTER — Ambulatory Visit: Payer: PPO | Admitting: Occupational Therapy

## 2018-06-13 ENCOUNTER — Encounter: Payer: Self-pay | Admitting: Physical Therapy

## 2018-06-13 ENCOUNTER — Ambulatory Visit: Payer: PPO | Admitting: Physical Therapy

## 2018-06-13 DIAGNOSIS — M6281 Muscle weakness (generalized): Secondary | ICD-10-CM

## 2018-06-13 DIAGNOSIS — R414 Neurologic neglect syndrome: Secondary | ICD-10-CM

## 2018-06-13 DIAGNOSIS — R2681 Unsteadiness on feet: Secondary | ICD-10-CM

## 2018-06-13 DIAGNOSIS — R262 Difficulty in walking, not elsewhere classified: Secondary | ICD-10-CM

## 2018-06-13 DIAGNOSIS — R278 Other lack of coordination: Secondary | ICD-10-CM

## 2018-06-13 NOTE — Therapy (Signed)
[place] MAIN [facility] SERVICES [address] [place], [place], [address] Phone: [phone]   Fax:  [phone]  Physical Therapy Treatment/ Physical Therapy Progress Note   Dates of reporting period 05/09/18   to  06/13/18  Patient Details  Name: James [name] MRN: [id number] Date of Birth: 08-31-1940 Referring Provider (PT): shah   Encounter Date: 06/13/2018  PT End of Session - 06/13/18 0908    Visit Number  29    Number of Visits  74    Date for PT Re-Evaluation  08/13/18    Authorization Type  10/10 starting on 05/09/18 progress note    PT Start Time  0915    PT Stop Time  1000    PT Time Calculation (min)  45 min    Equipment Utilized During Treatment  Gait belt    Activity Tolerance  Patient tolerated treatment well    Behavior During Therapy  WFL for tasks assessed/performed       Past Medical History:  Diagnosis Date  . Alcohol abuse, in remission   . Atrial fibrillation ([place])   . Bilateral renal masses   . Closed right ankle fracture   . 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 Gait disorder   . Gout   . Hypertension   . Seizure disorder ([place])   . 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     Past Surgical History:  Procedure Laterality Date  . CRANIECTOMY FOR DEPRESSED SKULL FRACTURE  2009   with MRSA infection  . CRANIOTOMY     X 2  . HERNIA REPAIR     during childhood  . ORIF ANKLE FRACTURE Left 2012    There were no vitals filed for this visit.  Treatment: Goals reviewed and outcome measures performed: TUG, 5 x sit to stand, Berg, DGI  Neuromuscular Re-education  Toe taps to 6" step without UE support alternating LE x15 each , needs UE 25% Normal stance on Airex pad 2x60sec (requires minA for falls recovery 2 times)?  Forward and backward stepping over hurdle x15 each direction , 25% need  of UE SIde stepping over hurdle 15x each direction (requires minA for falls), 25 % need of UE CGA and Min  verbal cues used throughout with increased in postural sway and LOB most seen with narrow base of support and while on uneven surfaces. Continues to have balance deficits typical with diagnosis. Patient performs intermediate level exercises without pain behaviors and needs verbal cuing for postural alignment and head positioning                           PT Short Term Goals - 06/13/18 0912      PT SHORT TERM GOAL #1   Title  Patient will be independent in home exercise program to improve strength/mobility for better functional independence with ADLs.    Baseline  05/09/2018: Pt reports doing bed exercises, 06/13/18  Pt reports doing bed exercises    Time  6    Period  Weeks    Status  Partially Met    Target Date  08/13/18      PT SHORT TERM GOAL #2   Title  Patient (> 12 years old) will complete five times sit to stand test in < 15 seconds indicating an increased LE strength  and improved balance.    Baseline  19.15 sec and needs CGA to min assist to keep from falling, 03/29/18=16.82 sec, 05/09/2018 15.23sec11/20/19=13.73    Time  6    Period  Weeks    Status  Partially Met    Target Date  08/13/18        PT Long Term Goals - 06/13/18 0913      PT LONG TERM GOAL #1   Title  Patient will increase Berg Balance score by > 6 points to demonstrate decreased fall risk during functional activities.    Baseline  36/56 Berg,;  03/29/18 Berg = 36/52; 05/09/18= 45/56, 06/13/18=43/56    Time  12    Period  Weeks    Status  Achieved    Target Date  08/13/18      PT LONG TERM GOAL #2   Title  Patient will reduce timed up and go to <11 seconds to reduce fall risk and demonstrate improved transfer/gait ability.    Baseline  15.18 sec with poor safety judgement and uncontrolled sit, 03/29/18=13.02 sec, 05/09/2018 10.53 sec, 06/13/18=9.48 sec    Time  12    Period  Weeks     Status  Achieved    Target Date  08/13/18      PT LONG TERM GOAL #3   Title  Patient will increase dynamic gait index score to >19/24 as to demonstrate reduced fall risk and improved dynamic gait balance for better safety with community/home ambulation.     Baseline  16/24, 03/29/18=16/25; 05/09/18=17/24, 06/13/18=17/24    Time  12    Period  Weeks    Status  Partially Met    Target Date  08/13/18      PT LONG TERM GOAL #4   Title  Patient will ascend and descend 4 stairs with single rail UE support using a step-through pattern with SBA in order to demonstrate improved balance, safety, and decrease risk of falls.    Baseline  05/09/18=CGA/Min A for safety, step-to pattern, LUE support, 06/13/18= LUE support and step to pattern    Time  4    Period  Weeks    Status  New    Target Date  08/13/18            Plan - 06/13/18 0936    Clinical Impression Statement  Patient's condition has the potential to improve in response to therapy. Maximum improvement is yet to be obtained. The anticipated improvement is attainable and reasonable in a generally predictable time.  Patient unable to report subjective due to aphasia.  Patient demonstrates improved stability and strength allowing patient to perform short duration standing interventions with rest periods.  Patient performs beginning standing dynamic standing balance exercises to shift weight and perform single leg standing activities with mod assist. Patient fatigues quickly with exercises requiring rest breaks at this time. Patient will continue to benefit from skilled physical therapy to improve pain and mobility.    Rehab Potential  Good    PT Frequency  2x / week    PT Duration  12 weeks    PT Treatment/Interventions  Therapeutic activities;Therapeutic exercise;Balance training;Neuromuscular re-education;Functional mobility training;Gait training;Manual techniques    PT Next Visit Plan  progress balance and HEP    PT Home Exercise Plan   heel raises, bridges    Consulted and Agree with Plan of Care  Patient       Patient will benefit from skilled therapeutic intervention in order to improve the following  deficits and impairments:  Abnormal gait, Decreased coordination, Decreased activity tolerance, Decreased endurance, Decreased strength, Decreased balance, Decreased safety awareness, Difficulty walking  Visit Diagnosis: Muscle weakness (generalized)  Other lack of coordination  Neurologic neglect syndrome  Unsteadiness on feet  Difficulty in walking, not elsewhere classified     Problem List Patient Active Problem List   Diagnosis Date Noted  . Sleep disturbance   . PAF (paroxysmal atrial fibrillation) ([place])   . Recurrent UTI   . Hypokalemia   . Hyponatremia   . Abnormal urine   . 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 Benign essential HTN   . Urinary retention   . History of gout   . Global aphasia   . SAH (subarachnoid hemorrhage) ([place])   . SDH (subdural hematoma) ([place])   . Lymphocytosis   . Subdural hemorrhage following injury ([place]) 05/12/2016    Alanson Puls, PT DPT 06/13/2018, 9:41 AM  [place] MAIN [facility] SERVICES [address] [place], [place], [address] Phone: [phone]   Fax:  [phone]  Name: James [name] MRN: [id number] Date of Birth: [birth year]

## 2018-06-15 ENCOUNTER — Encounter: Payer: Self-pay | Admitting: Occupational Therapy

## 2018-06-15 DIAGNOSIS — I2510 Atherosclerotic heart disease of native coronary artery without angina pectoris: Secondary | ICD-10-CM | POA: Diagnosis not present

## 2018-06-15 DIAGNOSIS — I10 Essential (primary) hypertension: Secondary | ICD-10-CM | POA: Diagnosis not present

## 2018-06-15 DIAGNOSIS — Z0000 Encounter for general adult medical examination without abnormal findings: Secondary | ICD-10-CM | POA: Diagnosis not present

## 2018-06-15 DIAGNOSIS — E538 Deficiency of other specified B group vitamins: Secondary | ICD-10-CM | POA: Diagnosis not present

## 2018-06-15 DIAGNOSIS — F3342 Major depressive disorder, recurrent, in full remission: Secondary | ICD-10-CM | POA: Diagnosis not present

## 2018-06-15 DIAGNOSIS — I4891 Unspecified atrial fibrillation: Secondary | ICD-10-CM | POA: Diagnosis not present

## 2018-06-15 DIAGNOSIS — M1A09X0 Idiopathic chronic gout, multiple sites, without tophus (tophi): Secondary | ICD-10-CM | POA: Diagnosis not present

## 2018-06-15 DIAGNOSIS — Z8679 Personal history of other diseases of the circulatory system: Secondary | ICD-10-CM | POA: Diagnosis not present

## 2018-06-15 DIAGNOSIS — I69320 Aphasia following cerebral infarction: Secondary | ICD-10-CM | POA: Diagnosis not present

## 2018-06-15 DIAGNOSIS — K219 Gastro-esophageal reflux disease without esophagitis: Secondary | ICD-10-CM | POA: Diagnosis not present

## 2018-06-15 DIAGNOSIS — G40209 Localization-related (focal) (partial) symptomatic epilepsy and epileptic syndromes with complex partial seizures, not intractable, without status epilepticus: Secondary | ICD-10-CM | POA: Diagnosis not present

## 2018-06-15 DIAGNOSIS — D696 Thrombocytopenia, unspecified: Secondary | ICD-10-CM | POA: Diagnosis not present

## 2018-06-15 DIAGNOSIS — E7849 Other hyperlipidemia: Secondary | ICD-10-CM | POA: Diagnosis not present

## 2018-06-15 NOTE — Therapy (Signed)
[place] MAIN [facility] SERVICES [address] [place], [place], [address] Phone: [phone]   Fax:  [phone]  Occupational Therapy Treatment  Patient Details  Name: James [name] MRN: [id number] Date of Birth: 09/03/1940 No data recorded  Encounter Date: 06/13/2018  OT End of Session - 06/15/18 1701    Visit Number  72    Number of Visits  24    Date for OT Re-Evaluation  08/29/18    Authorization Type  Medicare visit 2/10    OT Start Time  0830    OT Stop Time  0915    OT Time Calculation (min)  45 min    Activity Tolerance  Patient tolerated treatment well    Behavior During Therapy  [facility] for tasks assessed/performed       Past Medical History:  Diagnosis Date  . Alcohol abuse, in remission   . Atrial fibrillation ([place])   . Bilateral renal masses   . Closed right ankle fracture   . 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 Gait disorder   . Gout   . Hypertension   . Seizure disorder ([place])   . 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     Past Surgical History:  Procedure Laterality Date  . CRANIECTOMY FOR DEPRESSED SKULL FRACTURE  2009   with MRSA infection  . CRANIOTOMY     X 2  . HERNIA REPAIR     during childhood  . ORIF ANKLE FRACTURE Left 2012    There were no vitals filed for this visit.  Subjective Assessment - 06/15/18 1659    Subjective   Wife reports patient had to go to financial planner and sign some papers and did well with his signature this week.     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  He suffered another fall in 2017.  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     Patient Stated Goals  to be able to use the right hand functionally    Currently in Pain?  No/denies    Multiple Pain Sites  No            Patient seen for focus on prehension patterns with use of small push pin sized Magnets with emphasis on prehension with use of right index and thumb, tip to tip.  Occasionally changing to utilize a lateral pinch to pick up and place onto magnetic board placed in elevation.  Worked towards attempts to separate magnets from [employer] to place individually on the board.  Patient seen for ADL task of cutting with knife and fork with use of resistive Putty for simulated piece of meat/food.  He was able to complete with cues for grasp and placement of right hand onto knife.  He tends to place his hand further back on the knife, and needs to hold it closer.   Patient seen for Handwriting skills with use of ruled paper, smaller lines to produce first and last name in cursive.  Patient has difficulty with writing in print and is used to cursive.  Legibility continues to slowly improve.                  OT Education - 06/15/18 1701    Education provided  Yes  Education Details  handwriting, prehension patterns    Person(s) Educated  Patient;Spouse    Methods  Explanation    Comprehension  Verbalized understanding;Returned demonstration;Verbal cues required          OT Long Term Goals - 06/12/18 1838      OT LONG TERM GOAL #1   Title  Patient will demonstrate use of utensil in right hand for self feeding with modified independence with little to no spillage.     Baseline  still has diff with spillage and managing utensils at times.    Time  12    Period  Weeks    Status  New      OT LONG TERM GOAL #2   Title  Pt will increase hand strength in right by 5# to be able to cut food with modified independence.    Baseline  unable at eval, able to cut meat with occasional assist at 30 visit.    Time  12    Period  Weeks    Status  Achieved      OT LONG TERM GOAL #3   Title  Patient will demonstrate increased awareness and strategies to attend to right side to hold items  without dropping items.    Baseline  drops items frequently, 30 visit-patient dropping objects less frequently and is more aware when holding objects in hand    Time  12    Period  Weeks    Status  On-going      OT LONG TERM GOAL #4   Title  Patient will demonstrate independence in home exercise program for ROM, strength and coordination of right hand.    Baseline  no current program, modified independent with current program at 30 visit but continuing to add exercises.    Time  6    Period  Weeks    Status  On-going      OT LONG TERM GOAL #5   Title  Patient will demonstrate the ability to obtain items from right pocket 90% of the time.    Baseline  difficulty getting keys or coins out of right pocket, 30 visit update-able to demonstrate 75% of the time    Time  12    Period  Weeks    Status  Achieved      OT LONG TERM GOAL #6   Title  Patient will demonstrate pouring a drink with the right hand with attention to right side and not knocking the cup over and spilling.      Baseline  patient often does not realize the cup is on his right side and knocks it over when attempting to get a drink    Time  12    Period  Weeks    Status  On-going            Plan - 06/15/18 1702    Clinical Impression Statement  Patient and wife pleased with progress regarding handwriting to be able to sign important papers this week.  He is now demonstrating modulation of size of letters and accomodating to appropriate sized space, legibility slowly improving.  Hand skills improving for utensil use and was able to cut items with knife and fork this date with minimal cues for hand placement on knife. Continue to work towards goals in plan of care to increase independence in daily tasks.     Occupational Profile and client history currently impacting functional performance  repeated falls, TBI, expressive aphasia, limited motion in  right hand, lack of coordination from previous CVA    Occupational  performance deficits (Please refer to evaluation for details):  ADL's;IADL's;Leisure    Rehab Potential  Good    Current Impairments/barriers affecting progress:  expressive aphasia, falls, neglect    OT Frequency  1x / week    OT Duration  12 weeks    OT Treatment/Interventions  Self-care/ADL training;Visual/perceptual remediation/compensation;DME and/or AE instruction;Patient/family education;Moist Heat;Therapeutic exercise;Manual Therapy;Therapeutic activities;Neuromuscular education    Consulted and Agree with Plan of Care  Patient    Family Member Consulted  wife, [name]       Patient will benefit from skilled therapeutic intervention in order to improve the following deficits and impairments:  Impaired vision/preception, Decreased coordination, Decreased safety awareness, Impaired UE functional use, Decreased strength  Visit Diagnosis: Other lack of coordination  Neurologic neglect syndrome  Muscle weakness (generalized)    Problem List Patient Active Problem List   Diagnosis Date Noted  . Sleep disturbance   . PAF (paroxysmal atrial fibrillation) ([place])   . Recurrent UTI   . Hypokalemia   . Hyponatremia   . Abnormal urine   . 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 Benign essential HTN   . Urinary retention   . History of gout   . Global aphasia   . SAH (subarachnoid hemorrhage) ([place])   . SDH (subdural hematoma) ([place])   . Lymphocytosis   . Subdural hemorrhage following injury [facility]) 05/12/2016   Kendan Cornforth T Arvie Bartholomew, OTR/L, CLT  Sarah Baez 06/15/2018, 5:06 PM  [place] MAIN [facility] SERVICES [address] [place], [place], [address] Phone: [phone]   Fax:  [phone]  Name: James [name] MRN: [id number] Date of Birth: [birth year]

## 2018-06-20 ENCOUNTER — Encounter: Payer: Self-pay | Admitting: Physical Therapy

## 2018-06-20 ENCOUNTER — Ambulatory Visit: Payer: PPO | Admitting: Occupational Therapy

## 2018-06-20 ENCOUNTER — Ambulatory Visit: Payer: PPO | Admitting: Physical Therapy

## 2018-06-20 DIAGNOSIS — R262 Difficulty in walking, not elsewhere classified: Secondary | ICD-10-CM

## 2018-06-20 DIAGNOSIS — R278 Other lack of coordination: Secondary | ICD-10-CM

## 2018-06-20 DIAGNOSIS — M6281 Muscle weakness (generalized): Secondary | ICD-10-CM | POA: Diagnosis not present

## 2018-06-20 DIAGNOSIS — R2681 Unsteadiness on feet: Secondary | ICD-10-CM

## 2018-06-20 NOTE — Therapy (Signed)
[place] MAIN [facility] SERVICES [address] [place], [place], [address] Phone: [phone]   Fax:  [phone]  Physical Therapy Treatment  Patient Details  Name: James [name] MRN: [id number] Date of Birth: 1941-04-20 Referring Provider (PT): shah   Encounter Date: 06/20/2018  PT End of Session - 06/20/18 0918    Visit Number  30    Number of Visits  47    Date for PT Re-Evaluation  08/29/18    Authorization Type  1/10 starting on 06/13/18 progress note    PT Start Time  0920    PT Stop Time  1000    PT Time Calculation (min)  40 min    Equipment Utilized During Treatment  Gait belt    Activity Tolerance  Patient tolerated treatment well    Behavior During Therapy  WFL for tasks assessed/performed       Past Medical History:  Diagnosis Date  . Alcohol abuse, in remission   . Atrial fibrillation ([place])   . Bilateral renal masses   . Closed right ankle fracture   . 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 Gait disorder   . Gout   . Hypertension   . Seizure disorder ([place])   . 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     Past Surgical History:  Procedure Laterality Date  . CRANIECTOMY FOR DEPRESSED SKULL FRACTURE  2009   with MRSA infection  . CRANIOTOMY     X 2  . HERNIA REPAIR     during childhood  . ORIF ANKLE FRACTURE Left 2012    There were no vitals filed for this visit.  TREATMENT  Subjective Assessment - 06/20/18 0923    Subjective  Patient denies any falls or pain since last session. Patient states that he is excited for Thanksgiving and has already been to the grocery store which was very busy.    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  He suffered another fall in 2017.  Since  2005 he has suffered from expressive aphasia.    Patient Stated Goals  Patient wants to have a safer gait and not stagger or feel off balance.     Currently in Pain?  No/denies    Multiple Pain Sites  No      TREATMENT  Therapeutic exercise: Octane fitness level 6 x 5 mins, VCs for use of whole foot contact and maintaining appropriate speed  Standing on 1/2 bolster (Flat side up): Heel/toe [name] 2x10 with finger tip hold for balance with cues to keep knee straight for better ankle strategies; 2 min balance in middle w/ min VCs for ankle strategy initiation and CGA Stepping over 1 hurdle fwd/bwd x10 cues for posture, increased hip flexion   Standing hip abd with 4# ankle weight x 20 BLE and  BUE support   Standing hip extension with 4# ankle weight x 20 BLE with BUE support  Standing hip flexion marches with 4# ankle weight x 20 BLE with BUE support  Standing HS curls with 4# ankle weight x 20 BLE with BUE support  Standing heel raises x 20 with 4# ankle weights  LAQ with 4# ankle weights with 1x10;  3 second holds x10  each LE Toe Taps with 4# ankle weights, x10 BLE, x10 alternating  STS from chair without UE support 2x10; noted increased posterior lean with increased reps; patient benefits from VCs for glute activation at the top of the movement    PT Education - 06/20/18 0917    Education Details  safety, body mechanics, exercise technique    Person(s) Educated  Patient    Methods  Explanation;Tactile cues;Demonstration;Verbal cues    Comprehension  Verbalized understanding;Need further instruction;Verbal cues required;Returned demonstration       PT Short Term Goals - 06/13/18 0912      PT SHORT TERM GOAL #1   Title  Patient will be independent in home exercise program to improve strength/mobility for better functional independence with ADLs.    Baseline  05/09/2018: Pt reports doing bed exercises, 06/13/18  Pt reports doing bed exercises    Time  6    Period  Weeks    Status   Partially Met    Target Date  08/29/18      PT SHORT TERM GOAL #2   Title  Patient (> 56 years old) will complete five times sit to stand test in < 15 seconds indicating an increased LE strength and improved balance.    Baseline  19.15 sec and needs CGA to min assist to keep from falling, 03/29/18=16.82 sec, 05/09/2018 15.23sec11/20/19=13.73    Time  6    Period  Weeks    Status  Partially Met    Target Date  08/29/18        PT Long Term Goals - 06/13/18 0913      PT LONG TERM GOAL #1   Title  Patient will increase Berg Balance score by > 6 points to demonstrate decreased fall risk during functional activities.    Baseline  36/56 Berg,;  03/29/18 Berg = 36/52; 05/09/18= 45/56, 06/13/18=43/56    Time  12    Period  Weeks    Status  On-going    Target Date  08/29/18      PT LONG TERM GOAL #2   Title  Patient will reduce timed up and go to <11 seconds to reduce fall risk and demonstrate improved transfer/gait ability.    Baseline  15.18 sec with poor safety judgement and uncontrolled sit, 03/29/18=13.02 sec, 05/09/2018 10.53 sec, 06/13/18=9.48 sec    Time  12    Period  Weeks    Status  On-going    Target Date  08/29/18      PT LONG TERM GOAL #3   Title  Patient will increase dynamic gait index score to >19/24 as to demonstrate reduced fall risk and improved dynamic gait balance for better safety with community/home ambulation.     Baseline  16/24, 03/29/18=16/25; 05/09/18=17/24, 06/13/18=17/24    Time  12    Period  Weeks    Status  On-going    Target Date  08/29/18      PT LONG TERM GOAL #4   Title  Patient will ascend and descend 4 stairs with single rail UE support using a step-through pattern with SBA in order to demonstrate improved balance, safety, and decrease risk of falls.    Baseline  05/09/18=CGA/Min A for safety, step-to pattern, LUE support, 06/13/18= LUE support and step to pattern    Time  4    Period  Weeks    Status  On-going    Target Date  08/29/18  Plan - 06/20/18 1004    Clinical Impression Statement  Patient presents with excellent motivation to participate in therapy. Patient demonstrates improving strength as evidenced by his tolerance of increased resistance and repetitions during standing strengthening exercises. Patient continues to benefit from VCs for safety and the maintenance of optimal form with controlled movement. Patient will continue to benefit from skilled therapeutic intervention to address deficits in balance, mobility, and strength in order to improve QOL and decrease risk for falls.     Rehab Potential  Good    PT Frequency  1x / week    PT Duration  12 weeks    PT Treatment/Interventions  Therapeutic activities;Therapeutic exercise;Balance training;Neuromuscular re-education;Functional mobility training;Gait training;Manual techniques    PT Next Visit Plan  progress balance and HEP    PT Home Exercise Plan  heel raises, bridges    Consulted and Agree with Plan of Care  Patient       Patient will benefit from skilled therapeutic intervention in order to improve the following deficits and impairments:  Abnormal gait, Decreased coordination, Decreased activity tolerance, Decreased endurance, Decreased strength, Decreased balance, Decreased safety awareness, Difficulty walking  Visit Diagnosis: Muscle weakness (generalized)  Other lack of coordination  Unsteadiness on feet  Difficulty in walking, not elsewhere classified     Problem List Patient Active Problem List   Diagnosis Date Noted  . Sleep disturbance   . PAF (paroxysmal atrial fibrillation) ([place])   . Recurrent UTI   . Hypokalemia   . Hyponatremia   . Abnormal urine   . 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 Benign essential HTN   . Urinary retention   . History of gout   . Global aphasia   . SAH (subarachnoid hemorrhage) ([place])   . SDH (subdural hematoma) ([place])   . Lymphocytosis   . Subdural hemorrhage following  injury [facility]) 05/12/2016   Myles Gip PT, DPT [phone] 06/20/2018, 10:07 AM  [place] MAIN [facility] SERVICES [place], [place], [address] Phone: [phone]   Fax:  [phone]  Name: James [name] MRN: [id number] Date of Birth: [birth year]

## 2018-06-27 ENCOUNTER — Ambulatory Visit: Payer: PPO | Admitting: Physical Therapy

## 2018-06-27 ENCOUNTER — Ambulatory Visit: Payer: PPO | Attending: Neurology | Admitting: Occupational Therapy

## 2018-06-27 DIAGNOSIS — M6281 Muscle weakness (generalized): Secondary | ICD-10-CM | POA: Insufficient documentation

## 2018-06-27 DIAGNOSIS — R414 Neurologic neglect syndrome: Secondary | ICD-10-CM | POA: Insufficient documentation

## 2018-06-27 DIAGNOSIS — R262 Difficulty in walking, not elsewhere classified: Secondary | ICD-10-CM | POA: Diagnosis not present

## 2018-06-27 DIAGNOSIS — R278 Other lack of coordination: Secondary | ICD-10-CM | POA: Diagnosis not present

## 2018-06-27 DIAGNOSIS — R2681 Unsteadiness on feet: Secondary | ICD-10-CM | POA: Diagnosis not present

## 2018-06-30 ENCOUNTER — Encounter: Payer: Self-pay | Admitting: Occupational Therapy

## 2018-06-30 NOTE — Therapy (Signed)
[place] MAIN [facility] SERVICES [address] [place], [place], [address] Phone: [phone]   Fax:  [phone]  Occupational Therapy Treatment  Patient Details  Name: James [name] MRN: [id number] Date of Birth: [birth year] No data recorded  Encounter Date: 06/27/2018  OT End of Session - 06/30/18 1558    Visit Number  45    Number of Visits  29    Date for OT Re-Evaluation  08/29/18    Authorization Type  Medicare visit 3/10    OT Start Time  1345    OT Stop Time  1430    OT Time Calculation (min)  45 min    Activity Tolerance  Patient tolerated treatment well    Behavior During Therapy  [facility] for tasks assessed/performed       Past Medical History:  Diagnosis Date  . Alcohol abuse, in remission   . Atrial fibrillation ([place])   . Bilateral renal masses   . Closed right ankle fracture   . 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 Gait disorder   . Gout   . Hypertension   . Seizure disorder ([place])   . 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     Past Surgical History:  Procedure Laterality Date  . CRANIECTOMY FOR DEPRESSED SKULL FRACTURE  2009   with MRSA infection  . CRANIOTOMY     X 2  . HERNIA REPAIR     during childhood  . ORIF ANKLE FRACTURE Left 2012    There were no vitals filed for this visit.  Subjective Assessment - 06/30/18 1556    Subjective   Pt attempting to communicate and ask about playing solitaire on the computer.  Wife reports he gets a bit frustrated if he doesn't win the game.  When doing online puzzles for vision, he tends to click on ads on the right side of the screen.  They will plan to bring in his tablet for recommendations.     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  He suffered another fall in 2017.  Since 2005  he has suffered from expressive aphasia.     Patient Stated Goals  to be able to use the right hand functionally    Currently in Pain?  No/denies    Multiple Pain Sites  No         Visual scanning exercises with use of deck of cards for the following: Scatter design of cards with therapist calling out specific cards for patient to find.   Patient demonstrates slowness with right sided awareness and scanning but is able to complete With occasional minimal cues.  Patient also performing scatter pile with starting to seek cards in order of one specific suit from either an ascending or descending order.  Patient did not require any cues for this task and can work on speed of scanning and sorting at home as a part of his home program.    Manipulation of checker sized objects to pick up and place into grid with pattern design requiring visual scanning to the right to detect patterns  In a vertical, horizontal or diagonal pattern.                   OT Education - 06/30/18 1557  Education provided  Yes    Education Details  visual scanning/exercises for home program    Person(s) Educated  Patient;Spouse    Methods  Explanation;Demonstration;Verbal cues    Comprehension  Verbalized understanding;Returned demonstration;Verbal cues required          OT Long Term Goals - 06/12/18 1838      OT LONG TERM GOAL #1   Title  Patient will demonstrate use of utensil in right hand for self feeding with modified independence with little to no spillage.     Baseline  still has diff with spillage and managing utensils at times.    Time  12    Period  Weeks    Status  New      OT LONG TERM GOAL #2   Title  Pt will increase hand strength in right by 5# to be able to cut food with modified independence.    Baseline  unable at eval, able to cut meat with occasional assist at 30 visit.    Time  12    Period  Weeks    Status  Achieved      OT LONG TERM GOAL #3   Title  Patient  will demonstrate increased awareness and strategies to attend to right side to hold items without dropping items.    Baseline  drops items frequently, 30 visit-patient dropping objects less frequently and is more aware when holding objects in hand    Time  12    Period  Weeks    Status  On-going      OT LONG TERM GOAL #4   Title  Patient will demonstrate independence in home exercise program for ROM, strength and coordination of right hand.    Baseline  no current program, modified independent with current program at 30 visit but continuing to add exercises.    Time  6    Period  Weeks    Status  On-going      OT LONG TERM GOAL #5   Title  Patient will demonstrate the ability to obtain items from right pocket 90% of the time.    Baseline  difficulty getting keys or coins out of right pocket, 30 visit update-able to demonstrate 75% of the time    Time  12    Period  Weeks    Status  Achieved      OT LONG TERM GOAL #6   Title  Patient will demonstrate pouring a drink with the right hand with attention to right side and not knocking the cup over and spilling.      Baseline  patient often does not realize the cup is on his right side and knocks it over when attempting to get a drink    Time  12    Period  Weeks    Status  On-going            Plan - 06/30/18 1558    Clinical Impression Statement  Patient seen this date for focus on right sided awareness, visual scanning and attention to right side.  Patient continues to require occasional cues for right sided awareness but also demonstrates increased consistency with scanning to the right after trials of exercises.  Instructed on multiple exercises with cards to be able to work on this skill at home with instruction to wife as well.  They both have questions about using his tablet at home for games and want to bring it in for recommendations.  Continue to work  towards goals.     Occupational Profile and client history currently  impacting functional performance  repeated falls, TBI, expressive aphasia, limited motion in right hand, lack of coordination from previous CVA    Occupational performance deficits (Please refer to evaluation for details):  ADL's;IADL's;Leisure    Rehab Potential  Good    Current Impairments/barriers affecting progress:  expressive aphasia, falls, neglect    OT Frequency  1x / week    OT Duration  12 weeks    OT Treatment/Interventions  Self-care/ADL training;Visual/perceptual remediation/compensation;DME and/or AE instruction;Patient/family education;Moist Heat;Therapeutic exercise;Manual Therapy;Therapeutic activities;Neuromuscular education    Consulted and Agree with Plan of Care  Patient    Family Member Consulted  wife, [name]       Patient will benefit from skilled therapeutic intervention in order to improve the following deficits and impairments:  Impaired vision/preception, Decreased coordination, Decreased safety awareness, Impaired UE functional use, Decreased strength  Visit Diagnosis: Muscle weakness (generalized)  Other lack of coordination  Neurologic neglect syndrome    Problem List Patient Active Problem List   Diagnosis Date Noted  . Sleep disturbance   . PAF (paroxysmal atrial fibrillation) ([place])   . Recurrent UTI   . Hypokalemia   . Hyponatremia   . Abnormal urine   . 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 Benign essential HTN   . Urinary retention   . History of gout   . Global aphasia   . SAH (subarachnoid hemorrhage) ([place])   . SDH (subdural hematoma) ([place])   . Lymphocytosis   . Subdural hemorrhage following injury ([place]) 05/12/2016   Jaileigh Weimer T [place] Festa, OTR/L, CLT  Annette Bertelson 06/30/2018, 4:03 PM  [place] MAIN [facility] SERVICES [address] [place], [place], [address] Phone: [phone]   Fax:  [phone]  Name: Earle Troiano MRN: [id number] Date of Birth: Dec 30, 1940

## 2018-07-04 ENCOUNTER — Ambulatory Visit: Payer: PPO | Admitting: Occupational Therapy

## 2018-07-04 ENCOUNTER — Encounter: Payer: Self-pay | Admitting: Occupational Therapy

## 2018-07-04 ENCOUNTER — Ambulatory Visit: Payer: PPO | Admitting: Physical Therapy

## 2018-07-04 ENCOUNTER — Encounter: Payer: Self-pay | Admitting: Physical Therapy

## 2018-07-04 DIAGNOSIS — R278 Other lack of coordination: Secondary | ICD-10-CM

## 2018-07-04 DIAGNOSIS — R2681 Unsteadiness on feet: Secondary | ICD-10-CM

## 2018-07-04 DIAGNOSIS — R414 Neurologic neglect syndrome: Secondary | ICD-10-CM

## 2018-07-04 DIAGNOSIS — M6281 Muscle weakness (generalized): Secondary | ICD-10-CM | POA: Diagnosis not present

## 2018-07-04 DIAGNOSIS — R262 Difficulty in walking, not elsewhere classified: Secondary | ICD-10-CM

## 2018-07-04 NOTE — Therapy (Signed)
[place] MAIN [facility] SERVICES [address] [place], [place], [address] Phone: [phone]   Fax:  [phone]  Physical Therapy Treatment  Patient Details  Name: James [name] MRN: [id number] Date of Birth: [birth year] Referring Provider (PT): shah   Encounter Date: 07/04/2018  PT End of Session - 07/04/18 0838    Visit Number  31    Number of Visits  53    Date for PT Re-Evaluation  08/29/18    Authorization Type  2/10 starting on 06/13/18 progress note    PT Start Time  0845    PT Stop Time  0930    PT Time Calculation (min)  45 min    Equipment Utilized During Treatment  Gait belt    Activity Tolerance  Patient tolerated treatment well    Behavior During Therapy  WFL for tasks assessed/performed       Past Medical History:  Diagnosis Date  . Alcohol abuse, in remission   . Atrial fibrillation ([place])   . Bilateral renal masses   . Closed right ankle fracture   . 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 Gait disorder   . Gout   . Hypertension   . Seizure disorder ([place])   . 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     Past Surgical History:  Procedure Laterality Date  . CRANIECTOMY FOR DEPRESSED SKULL FRACTURE  2009   with MRSA infection  . CRANIOTOMY     X 2  . HERNIA REPAIR     during childhood  . ORIF ANKLE FRACTURE Left 2012    There were no vitals filed for this visit.  Subjective Assessment - 07/04/18 0833    Subjective  Patient denies any falls or pain since last session. Patient states that he is excited for Thanksgiving and has already been to the grocery store which was very busy.    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  He suffered another fall in 2017.  Since 2005 he has  suffered from expressive aphasia.    Patient Stated Goals  Patient wants to have a safer gait and not stagger or feel off balance.     Currently in Pain?  No/denies    Multiple Pain Sites  No       Ther-ex  Standing hip abd with 2# ankle weight x 15 BLE and  BUE support  Standing hip extension with 2# ankle weight x 10 BLE with BUE support  Standing hip flexion marches with 2# ankle weight x 10 BLE with BUE support  Standing HS curls with 2# ankle weight x 10 BLE with BUE support  Standing heel raises x 15 with 2# ankle weights  LAQ with 2# ankle weights with 3 second holds x10 each LE  STS from chair without UE support 2x10  Mini squats x10 with no UE support      Neuromuscular Re-education  Toe taps to 6" step without UE support alternating LE x15 each  Normal stance on Airex pad 2x60sec (requires minA for falls recovery 2 times)?  Forward and backward stepping over hurdle x15 each direction  SIde stepping over hurdle 15x each direction   Patient has difficulty with  Motor control  and needs tactile cues for correct positioning.                      PT Education - 07/04/18 0837    Education Details  safety, body mechanics, exercise technique    Person(s) Educated  Patient    Methods  Explanation    Comprehension  Verbalized understanding;Tactile cues required;Returned demonstration       PT Short Term Goals - 06/13/18 0912      PT SHORT TERM GOAL #1   Title  Patient will be independent in home exercise program to improve strength/mobility for better functional independence with ADLs.    Baseline  05/09/2018: Pt reports doing bed exercises, 06/13/18  Pt reports doing bed exercises    Time  6    Period  Weeks    Status  Partially Met    Target Date  08/29/18      PT SHORT TERM GOAL #2   Title  Patient (> 16 years old) will complete five times sit to stand test in < 15 seconds indicating an increased LE strength and improved balance.    Baseline  19.15  sec and needs CGA to min assist to keep from falling, 03/29/18=16.82 sec, 05/09/2018 15.23sec11/20/19=13.73    Time  6    Period  Weeks    Status  Partially Met    Target Date  08/29/18        PT Long Term Goals - 06/13/18 0913      PT LONG TERM GOAL #1   Title  Patient will increase Berg Balance score by > 6 points to demonstrate decreased fall risk during functional activities.    Baseline  36/56 Berg,;  03/29/18 Berg = 36/52; 05/09/18= 45/56, 06/13/18=43/56    Time  12    Period  Weeks    Status  On-going    Target Date  08/29/18      PT LONG TERM GOAL #2   Title  Patient will reduce timed up and go to <11 seconds to reduce fall risk and demonstrate improved transfer/gait ability.    Baseline  15.18 sec with poor safety judgement and uncontrolled sit, 03/29/18=13.02 sec, 05/09/2018 10.53 sec, 06/13/18=9.48 sec    Time  12    Period  Weeks    Status  On-going    Target Date  08/29/18      PT LONG TERM GOAL #3   Title  Patient will increase dynamic gait index score to >19/24 as to demonstrate reduced fall risk and improved dynamic gait balance for better safety with community/home ambulation.     Baseline  16/24, 03/29/18=16/25; 05/09/18=17/24, 06/13/18=17/24    Time  12    Period  Weeks    Status  On-going    Target Date  08/29/18      PT LONG TERM GOAL #4   Title  Patient will ascend and descend 4 stairs with single rail UE support using a step-through pattern with SBA in order to demonstrate improved balance, safety, and decrease risk of falls.    Baseline  05/09/18=CGA/Min A for safety, step-to pattern, LUE support, 06/13/18= LUE support and step to pattern    Time  4    Period  Weeks    Status  On-going    Target Date  08/29/18            Plan - 07/04/18 0839    Clinical Impression Statement  Patient required max verbal cues to perform  matrix fwd/ bwd/ side stepping with weights for posture and control and required verbal and tactile cues during all dynamic standing  balance activities. Patient demonstrated decreased gait speed with ambulation, with poor motor control  without AD.  Patient will continue to benefit from skilled therapy in order to improve strength, dynamic standing balance and increase gait speed to reduce risk for falls    Rehab Potential  Good    PT Frequency  1x / week    PT Duration  12 weeks    PT Treatment/Interventions  Therapeutic activities;Therapeutic exercise;Balance training;Neuromuscular re-education;Functional mobility training;Gait training;Manual techniques    PT Next Visit Plan  progress balance and HEP    PT Home Exercise Plan  heel raises, bridges    Consulted and Agree with Plan of Care  Patient       Patient will benefit from skilled therapeutic intervention in order to improve the following deficits and impairments:  Abnormal gait, Decreased coordination, Decreased activity tolerance, Decreased endurance, Decreased strength, Decreased balance, Decreased safety awareness, Difficulty walking  Visit Diagnosis: Muscle weakness (generalized)  Other lack of coordination  Neurologic neglect syndrome  Unsteadiness on feet  Difficulty in walking, not elsewhere classified     Problem List Patient Active Problem List   Diagnosis Date Noted  . Sleep disturbance   . PAF (paroxysmal atrial fibrillation) ([place])   . Recurrent UTI   . Hypokalemia   . Hyponatremia   . Abnormal urine   . 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 Benign essential HTN   . Urinary retention   . History of gout   . Global aphasia   . SAH (subarachnoid hemorrhage) ([place])   . SDH (subdural hematoma) ([place])   . Lymphocytosis   . Subdural hemorrhage following injury ([place]) 05/12/2016    Alanson Puls, PT DPT 07/04/2018, 8:41 AM  [place] MAIN [facility] SERVICES [address] [place], [place], [address] Phone: [phone]   Fax:  [phone]  Name: James [name] MRN:  [id number] Date of Birth: [birth year]

## 2018-07-04 NOTE — Therapy (Signed)
[place] MAIN [facility] SERVICES [address] [place], [place], [address] Phone: [phone]   Fax:  [phone]  Occupational Therapy Treatment  Patient Details  Name: James [name] MRN: [id number] Date of Birth: 17-Oct-1940 No data recorded  Encounter Date: 07/04/2018  OT End of Session - 07/04/18 0936    Visit Number  40    Number of Visits  11    Date for OT Re-Evaluation  08/29/18    Authorization Type  Medicare visit 4/10    OT Start Time  0932    OT Stop Time  1015    OT Time Calculation (min)  43 min    Activity Tolerance  Patient tolerated treatment well    Behavior During Therapy  [facility] for tasks assessed/performed       Past Medical History:  Diagnosis Date  . Alcohol abuse, in remission   . Atrial fibrillation ([place])   . Bilateral renal masses   . Closed right ankle fracture   . 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 Gait disorder   . Gout   . Hypertension   . Seizure disorder ([place])   . 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     Past Surgical History:  Procedure Laterality Date  . CRANIECTOMY FOR DEPRESSED SKULL FRACTURE  2009   with MRSA infection  . CRANIOTOMY     X 2  . HERNIA REPAIR     during childhood  . ORIF ANKLE FRACTURE Left 2012    There were no vitals filed for this visit.  Subjective Assessment - 07/04/18 0935    Subjective   Patient forgot to bring in tablet today based on conversation last week regarding access to games and use of keyboard on tablet.     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  He suffered another fall in 2017.  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     Patient Stated Goals  to be able to use the right hand functionally    Currently in Pain?  No/denies    Multiple Pain Sites   No        Patient seen for use of computer mouse to work towards managing computer screen and pages.  Patient Demonstrates difficulty with mouse in clinic due to right and left click buttons and sensitivity of mouse.  Also Difficulty with working on a small space to click in.  Patient may perform better with an one click mouse for his  Tablet, wife and patient forgot to bring in today but will try to bring in next session.    Patient seen for scanning exercises for vision for right visual field and attention to right side.  Patient demos difficulty with Complex search a word, does best with larger font with words less than 5 letters in length.  Issued one To take home to work on as homework this week.                          OT Long Term Goals - 06/12/18 1838      OT LONG TERM GOAL #1   Title  Patient will demonstrate use of utensil in right hand for self feeding with modified independence with little  to no spillage.     Baseline  still has diff with spillage and managing utensils at times.    Time  12    Period  Weeks    Status  New      OT LONG TERM GOAL #2   Title  Pt will increase hand strength in right by 5# to be able to cut food with modified independence.    Baseline  unable at eval, able to cut meat with occasional assist at 30 visit.    Time  12    Period  Weeks    Status  Achieved      OT LONG TERM GOAL #3   Title  Patient will demonstrate increased awareness and strategies to attend to right side to hold items without dropping items.    Baseline  drops items frequently, 30 visit-patient dropping objects less frequently and is more aware when holding objects in hand    Time  12    Period  Weeks    Status  On-going      OT LONG TERM GOAL #4   Title  Patient will demonstrate independence in home exercise program for ROM, strength and coordination of right hand.    Baseline  no current program, modified independent with current program at 30  visit but continuing to add exercises.    Time  6    Period  Weeks    Status  On-going      OT LONG TERM GOAL #5   Title  Patient will demonstrate the ability to obtain items from right pocket 90% of the time.    Baseline  difficulty getting keys or coins out of right pocket, 30 visit update-able to demonstrate 75% of the time    Time  12    Period  Weeks    Status  Achieved      OT LONG TERM GOAL #6   Title  Patient will demonstrate pouring a drink with the right hand with attention to right side and not knocking the cup over and spilling.      Baseline  patient often does not realize the cup is on his right side and knocks it over when attempting to get a drink    Time  12    Period  Weeks    Status  On-going            Plan - 07/04/18 0955    Clinical Impression Statement  Patient had difficulty today with attempting to use a computer mouse to operate the screen, the specific site we used was very sensitive and if he didn't click directly in the small box, it closed the whole area.  Will try with his tablet to see if we can find a mouse that is compatible and easy to use.      Occupational Profile and client history currently impacting functional performance  repeated falls, TBI, expressive aphasia, limited motion in right hand, lack of coordination from previous CVA    Occupational performance deficits (Please refer to evaluation for details):  ADL's;IADL's;Leisure    Rehab Potential  Good    Current Impairments/barriers affecting progress:  expressive aphasia, falls, neglect    OT Frequency  1x / week    OT Duration  12 weeks    OT Treatment/Interventions  Self-care/ADL training;Visual/perceptual remediation/compensation;DME and/or AE instruction;Patient/family education;Moist Heat;Therapeutic exercise;Manual Therapy;Therapeutic activities;Neuromuscular education    Consulted and Agree with Plan of Care  Patient    Family Member Consulted  wife, [name]       Patient will  benefit from skilled therapeutic intervention in order to improve the following deficits and impairments:  Impaired vision/preception, Decreased coordination, Decreased safety awareness, Impaired UE functional use, Decreased strength  Visit Diagnosis: Muscle weakness (generalized)  Other lack of coordination  Neurologic neglect syndrome    Problem List Patient Active Problem List   Diagnosis Date Noted  . Sleep disturbance   . PAF (paroxysmal atrial fibrillation) ([place])   . Recurrent UTI   . Hypokalemia   . Hyponatremia   . Abnormal urine   . 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 Benign essential HTN   . Urinary retention   . History of gout   . Global aphasia   . SAH (subarachnoid hemorrhage) ([place])   . SDH (subdural hematoma) ([place])   . Lymphocytosis   . Subdural hemorrhage following injury [facility]) 05/12/2016   Cicero Noy T [place] Plagge, OTR/L, CLT Walden Statz 07/04/2018, 8:26 PM  [place] MAIN [facility] SERVICES [address] [place], [place], [address] Phone: [phone]   Fax:  [phone]  Name: James [name] MRN: [id number] Date of Birth: [birth year]

## 2018-07-11 ENCOUNTER — Encounter: Payer: Self-pay | Admitting: Occupational Therapy

## 2018-07-11 ENCOUNTER — Ambulatory Visit: Payer: PPO | Admitting: Physical Therapy

## 2018-07-11 ENCOUNTER — Encounter: Payer: Self-pay | Admitting: Physical Therapy

## 2018-07-11 ENCOUNTER — Ambulatory Visit: Payer: PPO | Admitting: Occupational Therapy

## 2018-07-11 DIAGNOSIS — R278 Other lack of coordination: Secondary | ICD-10-CM

## 2018-07-11 DIAGNOSIS — R2681 Unsteadiness on feet: Secondary | ICD-10-CM

## 2018-07-11 DIAGNOSIS — M6281 Muscle weakness (generalized): Secondary | ICD-10-CM | POA: Diagnosis not present

## 2018-07-11 DIAGNOSIS — R262 Difficulty in walking, not elsewhere classified: Secondary | ICD-10-CM

## 2018-07-11 DIAGNOSIS — R414 Neurologic neglect syndrome: Secondary | ICD-10-CM

## 2018-07-11 NOTE — Therapy (Signed)
[place] MAIN [facility] SERVICES [address] [place], [place], [address] Phone: [phone]   Fax:  [phone]  Occupational Therapy Treatment  Patient Details  Name: James [name] MRN: [id number] Date of Birth: 10-18-1940 No data recorded  Encounter Date: 07/11/2018  OT End of Session - 07/13/18 1650    Visit Number  64    Number of Visits  4    Date for OT Re-Evaluation  08/29/18    Authorization Type  Medicare visit 5/10    OT Start Time  0930    OT Stop Time  1014    OT Time Calculation (min)  44 min    Activity Tolerance  Patient tolerated treatment well    Behavior During Therapy  [facility] for tasks assessed/performed       Past Medical History:  Diagnosis Date  . Alcohol abuse, in remission   . Atrial fibrillation ([place])   . Bilateral renal masses   . Closed right ankle fracture   . 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 Gait disorder   . Gout   . Hypertension   . Seizure disorder ([place])   . 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     Past Surgical History:  Procedure Laterality Date  . CRANIECTOMY FOR DEPRESSED SKULL FRACTURE  2009   with MRSA infection  . CRANIOTOMY     X 2  . HERNIA REPAIR     during childhood  . ORIF ANKLE FRACTURE Left 2012     Subjective Assessment - 07/13/18 1648    Subjective   Patient remembered to bring in his tablet today and wants to work on accessing app for games and use of right hand to operate keyboard.     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  He suffered another fall in 2017.  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     Patient Stated Goals  to be able to use the right hand functionally    Currently in Pain?  No/denies    Multiple Pain Sites  No       Patient seen this date  for focus on use of pen to circle items on paper working on pen grip and control of motion to  Form circle around the words.  Increased difficulty with search a word with letters close together.  When words were Spread out on a page, patient performed best.  Cues for pen grip and ways to control motions to circle words.        Patient seen today for use of tablet to access apps to play games at home. He tends to play with his left nondominant  hand, had patient utilize his right dominant hand this date, cues to keep contact with tablet with his right finger to drag  and drop items from one area to another. Some difficulty with this task however improved with focus and time.      Instructed patient and wife on home exercises related to tasks in the clinic this date so he can continue with focus on this area          OT Education - 07/13/18 1649    Education provided  Yes    Education Details  use of tablet, visual  scanning exercises, handwriting    Person(s) Educated  Patient;Spouse    Methods  Explanation;Demonstration;Verbal cues    Comprehension  Verbalized understanding;Returned demonstration;Verbal cues required          OT Long Term Goals - 06/12/18 1838      OT LONG TERM GOAL #1   Title  Patient will demonstrate use of utensil in right hand for self feeding with modified independence with little to no spillage.     Baseline  still has diff with spillage and managing utensils at times.    Time  12    Period  Weeks    Status  New      OT LONG TERM GOAL #2   Title  Pt will increase hand strength in right by 5# to be able to cut food with modified independence.    Baseline  unable at eval, able to cut meat with occasional assist at 30 visit.    Time  12    Period  Weeks    Status  Achieved      OT LONG TERM GOAL #3   Title  Patient will demonstrate increased awareness and strategies to attend to right side to hold items without dropping items.    Baseline   drops items frequently, 30 visit-patient dropping objects less frequently and is more aware when holding objects in hand    Time  12    Period  Weeks    Status  On-going      OT LONG TERM GOAL #4   Title  Patient will demonstrate independence in home exercise program for ROM, strength and coordination of right hand.    Baseline  no current program, modified independent with current program at 30 visit but continuing to add exercises.    Time  6    Period  Weeks    Status  On-going      OT LONG TERM GOAL #5   Title  Patient will demonstrate the ability to obtain items from right pocket 90% of the time.    Baseline  difficulty getting keys or coins out of right pocket, 30 visit update-able to demonstrate 75% of the time    Time  12    Period  Weeks    Status  Achieved      OT LONG TERM GOAL #6   Title  Patient will demonstrate pouring a drink with the right hand with attention to right side and not knocking the cup over and spilling.      Baseline  patient often does not realize the cup is on his right side and knocks it over when attempting to get a drink    Time  12    Period  Weeks    Status  On-going            Plan - 07/13/18 1650    Clinical Impression Statement  Patient seen today for use of tablet to access apps to play games at home.  He tends to play with his left nondominant hand, had patient utilize his right dominant hand this date, cues to keep contact with tablet with his right finger to drag and drop items from one area to another.  Some difficulty with this task however improved with focus and time.  Patient has difficulty with jigsaw puzzle app since it has ads on the right side of the screen and patient often clicks in this area mistakenly and causes the ads to pop up.  He  performed better today with use of pen with circling items when items where spread out more on the page than when in search a word puzzle Wife instructed on items we focused on this date and will  work on these tasks with patient over the holiday.      Occupational Profile and client history currently impacting functional performance  repeated falls, TBI, expressive aphasia, limited motion in right hand, lack of coordination from previous CVA    Occupational performance deficits (Please refer to evaluation for details):  ADL's;IADL's;Leisure    Rehab Potential  Good       Patient will benefit from skilled therapeutic intervention in order to improve the following deficits and impairments:  Impaired vision/preception, Decreased coordination, Decreased safety awareness, Impaired UE functional use, Decreased strength  Visit Diagnosis: Muscle weakness (generalized)  Other lack of coordination  Neurologic neglect syndrome    Problem List Patient Active Problem List   Diagnosis Date Noted  . Sleep disturbance   . PAF (paroxysmal atrial fibrillation) ([place])   . Recurrent UTI   . Hypokalemia   . Hyponatremia   . Abnormal urine   . 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 Benign essential HTN   . Urinary retention   . History of gout   . Global aphasia   . SAH (subarachnoid hemorrhage) ([place])   . SDH (subdural hematoma) ([place])   . Lymphocytosis   . Subdural hemorrhage following injury [facility]) 05/12/2016   Etosha Wetherell T Magdelyn Roebuck, OTR/L, CLT  Hakop Humbarger 07/13/2018, 4:59 PM  [place] MAIN [facility] SERVICES [address] [place], [place], [address] Phone: [phone]   Fax:  [phone]  Name: Kendrik Mcshan MRN: [id number] Date of Birth: 05/28/1941

## 2018-07-11 NOTE — Therapy (Signed)
[place] MAIN [facility] SERVICES [address] [place], [place], [address] Phone: [phone]   Fax:  [phone]  Physical Therapy Treatment  Patient Details  Name: James [name] MRN: [id number] Date of Birth: 07-29-1940 Referring Provider (PT): shah   Encounter Date: 07/11/2018  PT End of Session - 07/11/18 0836    Visit Number  32    Number of Visits  49    Date for PT Re-Evaluation  08/29/18    Authorization Type  2/10 starting on 06/13/18 progress note    PT Start Time  0850    PT Stop Time  0930    PT Time Calculation (min)  40 min    Equipment Utilized During Treatment  Gait belt    Activity Tolerance  Patient tolerated treatment well    Behavior During Therapy  WFL for tasks assessed/performed       Past Medical History:  Diagnosis Date  . Alcohol abuse, in remission   . Atrial fibrillation ([place])   . Bilateral renal masses   . Closed right ankle fracture   . 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 Gait disorder   . Gout   . Hypertension   . Seizure disorder ([place])   . 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     Past Surgical History:  Procedure Laterality Date  . CRANIECTOMY FOR DEPRESSED SKULL FRACTURE  2009   with MRSA infection  . CRANIOTOMY     X 2  . HERNIA REPAIR     during childhood  . ORIF ANKLE FRACTURE Left 2012    There were no vitals filed for this visit.  Subjective Assessment - 07/11/18 0836    Subjective  Patient denies any falls or pain since last session. Patient states that he is excited for Thanksgiving and has already been to the grocery store which was very busy.    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  He suffered another fall in 2017.  Since 2005 he has  suffered from expressive aphasia.    Patient Stated Goals  Patient wants to have a safer gait and not stagger or feel off balance.     Currently in Pain?  No/denies    Multiple Pain Sites  No      Treatment:   Leg press x 15 x 2 with 90 lbs, Verbal cues to complete slowly and not let knees snap back. 2nd set 105lbs   Single leg press 75# x15 each side. Verbal cues to slow down movement.     Step ups to 6 inch stool x 10. No UE suport. CGA   Eccentric step downs from 6 inch stool x 10 each LE. Visual cues for technique. Verbal cues to complete slow and tap heel.  BUE CGA   Standing on airex pad taps on 6in step. No UE support   Supine hip flex x10 with 2# Ankle weight with each LE. Verbal cues to complete slowly   hooklying marching x 10 each LE with 2# AW. Verbal cues    Bridges x10 3 seconds holds.    LAQ x 10. Verbal cues to complete slowly     hooklying hip abd/ER with gTB x 20  sidelying hip abd with 2# Ankle weight x 10 BLE. ModA for positioning and correct alignment for exercise   Standing hip abd with 2# ankle weight x 15 BLE and  BUE support   Standing hip extension with 2# ankle weight x 10 BLE with BUE support   Standing hip flexion marches with 2# ankle weight x 10 BLE with BUE support    CGA and Min to mod verbal cues used throughout with increased in postural sway and LOB most seen with narrow base of support and while on uneven surfaces. Continues to have balance deficits typical with diagnosis. Patient performs intermediate level exercises without pain behaviors.                        PT Education - 07/11/18 0836    Education Details  HEP    Person(s) Educated  Patient    Methods  Explanation    Comprehension  Verbalized understanding       PT Short Term Goals - 06/13/18 0912      PT SHORT TERM GOAL #1   Title  Patient will be independent in home exercise program to improve strength/mobility for better functional independence  with ADLs.    Baseline  05/09/2018: Pt reports doing bed exercises, 06/13/18  Pt reports doing bed exercises    Time  6    Period  Weeks    Status  Partially Met    Target Date  08/29/18      PT SHORT TERM GOAL #2   Title  Patient (> 77 years old) will complete five times sit to stand test in < 15 seconds indicating an increased LE strength and improved balance.    Baseline  19.15 sec and needs CGA to min assist to keep from falling, 03/29/18=16.82 sec, 05/09/2018 15.23sec11/20/19=13.73    Time  6    Period  Weeks    Status  Partially Met    Target Date  08/29/18        PT Long Term Goals - 06/13/18 0913      PT LONG TERM GOAL #1   Title  Patient will increase Berg Balance score by > 6 points to demonstrate decreased fall risk during functional activities.    Baseline  36/56 Berg,;  03/29/18 Berg = 36/52; 05/09/18= 45/56, 06/13/18=43/56    Time  12    Period  Weeks    Status  On-going    Target Date  08/29/18      PT LONG TERM GOAL #2   Title  Patient will reduce timed up and go to <11 seconds to reduce fall risk and demonstrate improved transfer/gait ability.    Baseline  15.18 sec with poor safety judgement and uncontrolled sit, 03/29/18=13.02 sec, 05/09/2018 10.53 sec, 06/13/18=9.48 sec    Time  12    Period  Weeks    Status  On-going    Target Date  08/29/18      PT LONG TERM GOAL #3   Title  Patient will increase dynamic gait index score to >19/24 as to demonstrate reduced fall risk and improved dynamic gait balance for better safety with community/home ambulation.     Baseline  16/24, 03/29/18=16/25; 05/09/18=17/24, 06/13/18=17/24    Time  12    Period  Weeks    Status  On-going    Target Date  08/29/18      PT LONG TERM GOAL #4   Title  Patient will ascend and  descend 4 stairs with single rail UE support using a step-through pattern with SBA in order to demonstrate improved balance, safety, and decrease risk of falls.    Baseline  05/09/18=CGA/Min A for safety, step-to  pattern, LUE support, 06/13/18= LUE support and step to pattern    Time  4    Period  Weeks    Status  On-going    Target Date  08/29/18            Plan - 07/11/18 0839    Clinical Impression Statement  Pt responded well to all interventions today.  Focus on core and hip strengthening in order to improve balance and ambulation.  Pt performed hip ABD with weight and single leg bridges against gravity..  Pt was able to complete core exercises, requiring cueing for proper form and hip alignment during seated and supine core activities. Pt would continue to benefit from skilled therapy services in order to improve LE and core strength, gait and functional mobility in order to decrease risk of falls and improve independence with mobility.    Rehab Potential  Good    PT Frequency  1x / week    PT Duration  12 weeks    PT Treatment/Interventions  Therapeutic activities;Therapeutic exercise;Balance training;Neuromuscular re-education;Functional mobility training;Gait training;Manual techniques    PT Next Visit Plan  progress balance and HEP    PT Home Exercise Plan  heel raises, bridges    Consulted and Agree with Plan of Care  Patient       Patient will benefit from skilled therapeutic intervention in order to improve the following deficits and impairments:  Abnormal gait, Decreased coordination, Decreased activity tolerance, Decreased endurance, Decreased strength, Decreased balance, Decreased safety awareness, Difficulty walking  Visit Diagnosis: Muscle weakness (generalized)  Other lack of coordination  Neurologic neglect syndrome  Unsteadiness on feet  Difficulty in walking, not elsewhere classified     Problem List Patient Active Problem List   Diagnosis Date Noted  . Sleep disturbance   . PAF (paroxysmal atrial fibrillation) ([place])   . Recurrent UTI   . Hypokalemia   . Hyponatremia   . Abnormal urine   . 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 Benign  essential HTN   . Urinary retention   . History of gout   . Global aphasia   . SAH (subarachnoid hemorrhage) ([place])   . SDH (subdural hematoma) ([place])   . Lymphocytosis   . Subdural hemorrhage following injury ([place]) 05/12/2016    Alanson Puls, PT DPT 07/11/2018, 9:33 AM  [place] MAIN [facility] SERVICES [address] [place], [place], [address] Phone: [phone]   Fax:  [phone]  Name: James [name] MRN: [id number] Date of Birth: 01/16/1941

## 2018-07-19 ENCOUNTER — Ambulatory Visit: Payer: PPO | Admitting: Physical Therapy

## 2018-07-19 ENCOUNTER — Ambulatory Visit: Payer: PPO | Admitting: Occupational Therapy

## 2018-07-19 ENCOUNTER — Encounter: Payer: Self-pay | Admitting: Physical Therapy

## 2018-07-19 ENCOUNTER — Encounter: Payer: Self-pay | Admitting: Occupational Therapy

## 2018-07-19 DIAGNOSIS — R414 Neurologic neglect syndrome: Secondary | ICD-10-CM

## 2018-07-19 DIAGNOSIS — M6281 Muscle weakness (generalized): Secondary | ICD-10-CM

## 2018-07-19 DIAGNOSIS — R278 Other lack of coordination: Secondary | ICD-10-CM

## 2018-07-19 DIAGNOSIS — R2681 Unsteadiness on feet: Secondary | ICD-10-CM

## 2018-07-19 DIAGNOSIS — R262 Difficulty in walking, not elsewhere classified: Secondary | ICD-10-CM

## 2018-07-19 NOTE — Therapy (Signed)
[place] MAIN [facility] SERVICES [address] [place], [place], [address] Phone: [phone]   Fax:  [phone]  Physical Therapy Treatment  Patient Details  Name: James [name] MRN: [id number] Date of Birth: [birth year] Referring Provider (PT): shah   Encounter Date: 07/19/2018  PT End of Session - 07/19/18 0957    Visit Number  33    Number of Visits  52    Date for PT Re-Evaluation  08/29/18    Authorization Type  3/10 starting on 06/13/18 progress note    PT Start Time  0930    PT Stop Time  1010    PT Time Calculation (min)  40 min    Equipment Utilized During Treatment  Gait belt    Activity Tolerance  Patient tolerated treatment well    Behavior During Therapy  WFL for tasks assessed/performed       Past Medical History:  Diagnosis Date  . Alcohol abuse, in remission   . Atrial fibrillation ([place])   . Bilateral renal masses   . Closed right ankle fracture   . 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 Gait disorder   . Gout   . Hypertension   . Seizure disorder ([place])   . 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     Past Surgical History:  Procedure Laterality Date  . CRANIECTOMY FOR DEPRESSED SKULL FRACTURE  2009   with MRSA infection  . CRANIOTOMY     X 2  . HERNIA REPAIR     during childhood  . ORIF ANKLE FRACTURE Left 2012    There were no vitals filed for this visit.  Subjective Assessment - 07/19/18 0956    Subjective  Patient denies any falls or pain since last session. 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  He suffered another fall in 2017.  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    Patient Stated Goals  Patient wants to have a safer  gait and not stagger or feel off balance.     Currently in Pain?  No/denies    Multiple Pain Sites  No        Neuromuscular Re-education  Performed outcome measures toreview goals: Berg, DGI, TUG, stairs goal  Side stepping on TM at . 3 miles/ hr and elevation 1 x 3 mins left and 3 mins right  Airex balance with toe taps to 6" step alternating LE x 10 each, faded UE support, continues to need UE support 75%  Static balance on 1/2 bolster (flat side up) with no UE support 30s x 2  Airex padHead turnsx2 min, CGA for safety, demonstrated difficulty with catching the ball on its return and required VCs to control the speed of toss to control the return speed  BOSU ball lunges with single UE support x 15 BLE , cues and modeling / demonstration for instructions  Therapeutic exercise: Leg press 75 lbs x 20 x 3  Squats with 3 sec hold x 10 x 2 , cues for correct technique and form  CGA and  mod verbal cues used throughout with increased in postural sway and LOB most seen with narrow base of support  and while on uneven surfaces. Continues to have balance deficits typical with diagnosis. Patient performs intermediate level exercises without pain behaviors and needs verbal cuing for postural alignment and head positioning                         PT Education - 07/19/18 0957    Education Details  safety, body mechanics, exercise technique    Person(s) Educated  Patient    Methods  Explanation    Comprehension  Need further instruction       PT Short Term Goals - 06/13/18 0912      PT SHORT TERM GOAL #1   Title  Patient will be independent in home exercise program to improve strength/mobility for better functional independence with ADLs.    Baseline  05/09/2018: Pt reports doing bed exercises, 06/13/18  Pt reports doing bed exercises    Time  6    Period  Weeks    Status  Partially Met    Target Date  08/29/18      PT SHORT TERM GOAL #2   Title   Patient (77 years old) will complete five times sit to stand test in < 15 seconds indicating an increased LE strength and improved balance.    Baseline  19.15 sec and needs CGA to min assist to keep from falling, 03/29/18=16.82 sec, 05/09/2018 15.23sec11/20/19=13.73    Time  6    Period  Weeks    Status  Partially Met    Target Date  08/29/18        PT Long Term Goals - 07/19/18 1003      PT LONG TERM GOAL #1   Title  Patient will increase Berg Balance score by > 6 points to demonstrate decreased fall risk during functional activities.    Baseline  36/56 Berg,;  03/29/18 Berg = 36/52; 05/09/18= 45/56, 06/13/18=43/56, 07/19/18= 44/56    Time  12    Period  Weeks    Status  On-going    Target Date  08/29/18      PT LONG TERM GOAL #2   Title  Patient will reduce timed up and go to <11 seconds to reduce fall risk and demonstrate improved transfer/gait ability.    Baseline  15.18 sec with poor safety judgement and uncontrolled sit, 03/29/18=13.02 sec, 05/09/2018 10.53 sec, 06/13/18=9.48 sec, 07/19/18  10.52    Time  12    Period  Weeks    Status  On-going    Target Date  08/29/18      PT LONG TERM GOAL #3   Title  Patient will increase dynamic gait index score to >19/24 as to demonstrate reduced fall risk and improved dynamic gait balance for better safety with community/home ambulation.     Baseline  16/24, 03/29/18=16/25; 05/09/18=17/24, 06/13/18=17/24, 07/19/18 = 17/24    Time  12    Period  Weeks    Status  On-going    Target Date  08/29/18      PT LONG TERM GOAL #4   Title  Patient will ascend and descend 4 stairs with single rail UE support using a step-through pattern with SBA in order to demonstrate improved balance, safety, and decrease risk of falls.    Baseline  05/09/18=CGA/Min A for safety, step-to pattern, LUE support, 06/13/18= LUE support and step to pattern, 07/19/18 needs UE support    Time  4    Period  Weeks    Status  On-going    Target Date  08/29/18             Plan - 07/19/18 1002    Clinical Impression Statement  Pt (77 years old) requires direction and verbal cues for correct performance of exercises. Patient demonstrates weakness in BLE and performs open and closed chain exercises with minimal rest periods. Patient performs dynamic standing balance activities for coordination and improving motor control to BLE in narrow base of support. Pt was able to perform all exercises with max assist and VC for technique. Patient struggles with coordination of movement as well as balance with unstable surfaces.  Pt encouraged continuing new supine HEP .Follow-up as scheduled.    Rehab Potential  Good    PT Frequency  1x / week    PT Duration  12 weeks    PT Treatment/Interventions  Therapeutic activities;Therapeutic exercise;Balance training;Neuromuscular re-education;Functional mobility training;Gait training;Manual techniques    PT Next Visit Plan  progress balance and HEP    PT Home Exercise Plan  heel raises, bridges    Consulted and Agree with Plan of Care  Patient       Patient will benefit from skilled therapeutic intervention in order to improve the following deficits and impairments:  Abnormal gait, Decreased coordination, Decreased activity tolerance, Decreased endurance, Decreased strength, Decreased balance, Decreased safety awareness, Difficulty walking  Visit Diagnosis: Muscle weakness (generalized)  Neurologic neglect syndrome  Unsteadiness on feet  Other lack of coordination  Difficulty in walking, not elsewhere classified     Problem List Patient Active Problem List   Diagnosis Date Noted  . Sleep disturbance   . PAF (paroxysmal atrial fibrillation) ([place])   . Recurrent UTI   . Hypokalemia   . Hyponatremia   . Abnormal urine   . 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 Benign essential HTN   . Urinary retention   . History of gout   . Global aphasia   . SAH (subarachnoid hemorrhage) ([place])   . SDH (subdural  hematoma) ([place])   . Lymphocytosis   . Subdural hemorrhage following injury ([place]) 05/12/2016    Alanson Puls, PT DPT 07/19/2018, 10:06 AM  [place] MAIN [facility] SERVICES [address] [place], [place], [address] Phone: [phone]   Fax:  [phone]  Name: Kashmere Staffa MRN: [id number] Date of Birth: 01-Mar-1941

## 2018-07-20 NOTE — Therapy (Signed)
[place] MAIN [facility] SERVICES [address] [place], [place], [address] Phone: [phone]   Fax:  [phone]  Occupational Therapy Treatment  Patient Details  Name: James [name] MRN: [id number] Date of Birth: Jan 24, 1941 No data recorded  Encounter Date: 07/19/2018  OT End of Session - 07/19/18 1039    Visit Number  15    Number of Visits  75    Date for OT Re-Evaluation  08/29/18    Authorization Type  Medicare visit 6/10    OT Start Time  1020    OT Stop Time  1100    OT Time Calculation (min)  40 min    Activity Tolerance  Patient tolerated treatment well    Behavior During Therapy  [facility] for tasks assessed/performed       Past Medical History:  Diagnosis Date  . Alcohol abuse, in remission   . Atrial fibrillation ([place])   . Bilateral renal masses   . Closed right ankle fracture   . 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 Gait disorder   . Gout   . Hypertension   . Seizure disorder ([place])   . 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     Past Surgical History:  Procedure Laterality Date  . CRANIECTOMY FOR DEPRESSED SKULL FRACTURE  2009   with MRSA infection  . CRANIOTOMY     X 2  . HERNIA REPAIR     during childhood  . ORIF ANKLE FRACTURE Left 2012    There were no vitals filed for this visit.  Subjective Assessment - 07/19/18 1035    Subjective   Wife reports she thinks she has figured out how to get the ads off of the tablet since patient tends to always click on the ads located to the right of the screen.     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  He suffered another fall in 2017.  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     Patient Stated Goals  to be able to use the right hand functionally    Currently in Pain?   No/denies    Multiple Pain Sites  No       Patient seen for visual scanning with variety of colored objects placed to right side, therapist directed color of object for  Patient to pick up place into grid.  Patient requires cues to scan all the way to the far right side, missing items occasionally.    Patient focused on turning objects with right hand to place into grid and demonstrated min to moderate difficulty.   Handwriting for name, first and last in script with use of medium grip pen and cues to move tripod grasp down towards the end of the pen.                      OT Education - 07/19/18 1038    Education provided  Yes    Education Details  manipulation skills with right hand.    Person(s) Educated  Patient;Spouse    Methods  Explanation;Demonstration;Verbal cues    Comprehension  Verbalized understanding;Returned demonstration;Verbal cues required          OT Long Term Goals - 06/12/18 1838  OT LONG TERM GOAL #1   Title  Patient will demonstrate use of utensil in right hand for self feeding with modified independence with little to no spillage.     Baseline  still has diff with spillage and managing utensils at times.    Time  12    Period  Weeks    Status  New      OT LONG TERM GOAL #2   Title  Pt will increase hand strength in right by 5# to be able to cut food with modified independence.    Baseline  unable at eval, able to cut meat with occasional assist at 30 visit.    Time  12    Period  Weeks    Status  Achieved      OT LONG TERM GOAL #3   Title  Patient will demonstrate increased awareness and strategies to attend to right side to hold items without dropping items.    Baseline  drops items frequently, 30 visit-patient dropping objects less frequently and is more aware when holding objects in hand    Time  12    Period  Weeks    Status  On-going      OT LONG TERM GOAL #4   Title  Patient will demonstrate independence in home  exercise program for ROM, strength and coordination of right hand.    Baseline  no current program, modified independent with current program at 30 visit but continuing to add exercises.    Time  6    Period  Weeks    Status  On-going      OT LONG TERM GOAL #5   Title  Patient will demonstrate the ability to obtain items from right pocket 90% of the time.    Baseline  difficulty getting keys or coins out of right pocket, 30 visit update-able to demonstrate 75% of the time    Time  12    Period  Weeks    Status  Achieved      OT LONG TERM GOAL #6   Title  Patient will demonstrate pouring a drink with the right hand with attention to right side and not knocking the cup over and spilling.      Baseline  patient often does not realize the cup is on his right side and knocks it over when attempting to get a drink    Time  12    Period  Weeks    Status  On-going            Plan - 07/19/18 1040    Clinical Impression Statement  Patient continues to demonstrate some difficulty with manipulation of round objects with the right hand. He is able to remove items from the grid but difficulty with placing.  Patient continuing to work on developing a secure grasp on pen and writing name.  Continues to demonstrate neglect at times     Occupational Profile and client history currently impacting functional performance  repeated falls, TBI, expressive aphasia, limited motion in right hand, lack of coordination from previous CVA    Occupational performance deficits (Please refer to evaluation for details):  ADL's;IADL's;Leisure    Rehab Potential  Good    Current Impairments/barriers affecting progress:  expressive aphasia, falls, neglect    OT Frequency  1x / week    OT Duration  12 weeks    OT Treatment/Interventions  Self-care/ADL training;Visual/perceptual remediation/compensation;DME and/or AE instruction;Patient/family education;Moist Heat;Therapeutic exercise;Manual Therapy;Therapeutic  activities;Neuromuscular education  Consulted and Agree with Plan of Care  Patient    Family Member Consulted  wife, [name]       Patient will benefit from skilled therapeutic intervention in order to improve the following deficits and impairments:  Impaired vision/preception, Decreased coordination, Decreased safety awareness, Impaired UE functional use, Decreased strength  Visit Diagnosis: Muscle weakness (generalized)  Neurologic neglect syndrome  Other lack of coordination    Problem List Patient Active Problem List   Diagnosis Date Noted  . Sleep disturbance   . PAF (paroxysmal atrial fibrillation) ([place])   . Recurrent UTI   . Hypokalemia   . Hyponatremia   . Abnormal urine   . 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 Benign essential HTN   . Urinary retention   . History of gout   . Global aphasia   . SAH (subarachnoid hemorrhage) ([place])   . SDH (subdural hematoma) ([place])   . Lymphocytosis   . Subdural hemorrhage following injury [facility]) 05/12/2016    T , OTR/L, CLT   , 07/20/2018, 2:39 PM  [place] MAIN [facility] SERVICES [address] [place], [place], [address] Phone: [phone]   Fax:  [phone]  Name: Dmarcus Decicco MRN: [id number] Date of Birth: 12/14/1940

## 2018-07-24 ENCOUNTER — Encounter: Payer: Self-pay | Admitting: Occupational Therapy

## 2018-07-24 ENCOUNTER — Ambulatory Visit: Payer: PPO | Admitting: Occupational Therapy

## 2018-07-24 ENCOUNTER — Ambulatory Visit: Payer: PPO | Admitting: Physical Therapy

## 2018-07-24 ENCOUNTER — Encounter: Payer: Self-pay | Admitting: Physical Therapy

## 2018-07-24 DIAGNOSIS — R262 Difficulty in walking, not elsewhere classified: Secondary | ICD-10-CM

## 2018-07-24 DIAGNOSIS — R278 Other lack of coordination: Secondary | ICD-10-CM

## 2018-07-24 DIAGNOSIS — M6281 Muscle weakness (generalized): Secondary | ICD-10-CM | POA: Diagnosis not present

## 2018-07-24 DIAGNOSIS — R2681 Unsteadiness on feet: Secondary | ICD-10-CM

## 2018-07-24 DIAGNOSIS — R414 Neurologic neglect syndrome: Secondary | ICD-10-CM

## 2018-07-24 NOTE — Therapy (Signed)
[place] MAIN [facility] SERVICES [address] [place], [place], [address] Phone: [phone]   Fax:  [phone]  Physical Therapy Treatment  Patient Details  Name: James [name] MRN: [id number] Date of Birth: Dec 21, 1940 Referring Provider (PT): shah   Encounter Date: 07/24/2018  PT End of Session - 07/24/18 1009    Visit Number  34    Number of Visits  46    Date for PT Re-Evaluation  08/29/18    Authorization Type  4/10 starting on 06/13/18 progress note    PT Start Time  1015    PT Stop Time  1100    PT Time Calculation (min)  45 min    Equipment Utilized During Treatment  Gait belt    Activity Tolerance  Patient tolerated treatment well    Behavior During Therapy  [facility] for tasks assessed/performed       Past Medical History:  Diagnosis Date  . Alcohol abuse, in remission   . Atrial fibrillation ([place])   . Bilateral renal masses   . Closed right ankle fracture   . 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 Gait disorder   . Gout   . Hypertension   . Seizure disorder ([place])   . 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     Past Surgical History:  Procedure Laterality Date  . CRANIECTOMY FOR DEPRESSED SKULL FRACTURE  2009   with MRSA infection  . CRANIOTOMY     X 2  . HERNIA REPAIR     during childhood  . ORIF ANKLE FRACTURE Left 2012    There were no vitals filed for this visit.  Subjective Assessment - 07/24/18 1009    Subjective  Patient denies any falls or pain since last session. 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  He suffered another fall in 2017.  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    Patient Stated Goals  Patient wants to have a safer  gait and not stagger or feel off balance.     Currently in Pain?  No/denies    Multiple Pain Sites  No       Treatment: Side stepping on TM . 4 miles / hour x 5 mins  Side stepping on 2 x 4 x 5 reps and single HHA  Rocker board : fwd/bwd tapping softly, side to side tapping for lateral weight shifts; 50% UE assist  Side stepping in parallel bars with UE assist 50 % , CGA and cues for posture  Stepping onto AIREX, then on to 4 inch step followed by stepping down onto AIREX and then level surface in //bars x15.Pt required 75%UE assist, and performance improved with each repetition   bosu ball lungeSide step and back, x10 bilaterally; CGA, UE support and cues for lunging fwd  Eccentric step downs x 10 BLE; cues to not put too much weight bearing on UE's  3 way hip with RTB x 10 x 2  cues to maintain erect position  Tapping  to 6" step from foam  x 10 bilateral; cues for getting foot high enough  Quantum leg press 100 # x 20 x 3; cues for not  snapping LE in extension and performing slowly  Matrix 22. 5 lbs and side stepping left and side stepping right x 5   CGA and mod verbal cues used throughout with increased in postural sway and LOB most seen with narrow base of support and while on uneven surfaces. Continues to have balance deficits typical with diagnosis.                        PT Education - 07/24/18 1009    Education Details  safety, body mechanics, exercise technique    Person(s) Educated  Patient    Methods  Explanation    Comprehension  Returned demonstration;Need further instruction       PT Short Term Goals - 06/13/18 0912      PT SHORT TERM GOAL #1   Title  Patient will be independent in home exercise program to improve strength/mobility for better functional independence with ADLs.    Baseline  05/09/2018: Pt reports doing bed exercises, 06/13/18  Pt reports doing bed exercises    Time  6    Period  Weeks    Status   Partially Met    Target Date  08/29/18      PT SHORT TERM GOAL #2   Title  Patient (> 43 years old) will complete five times sit to stand test in < 15 seconds indicating an increased LE strength and improved balance.    Baseline  19.15 sec and needs CGA to min assist to keep from falling, 03/29/18=16.82 sec, 05/09/2018 15.23sec11/20/19=13.73    Time  6    Period  Weeks    Status  Partially Met    Target Date  08/29/18        PT Long Term Goals - 07/19/18 1003      PT LONG TERM GOAL #1   Title  Patient will increase Berg Balance score by > 6 points to demonstrate decreased fall risk during functional activities.    Baseline  36/56 Berg,;  03/29/18 Berg = 36/52; 05/09/18= 45/56, 06/13/18=43/56, 07/19/18= 44/56    Time  12    Period  Weeks    Status  On-going    Target Date  08/29/18      PT LONG TERM GOAL #2   Title  Patient will reduce timed up and go to <11 seconds to reduce fall risk and demonstrate improved transfer/gait ability.    Baseline  15.18 sec with poor safety judgement and uncontrolled sit, 03/29/18=13.02 sec, 05/09/2018 10.53 sec, 06/13/18=9.48 sec, 07/19/18  10.52    Time  12    Period  Weeks    Status  On-going    Target Date  08/29/18      PT LONG TERM GOAL #3   Title  Patient will increase dynamic gait index score to >19/24 as to demonstrate reduced fall risk and improved dynamic gait balance for better safety with community/home ambulation.     Baseline  16/24, 03/29/18=16/25; 05/09/18=17/24, 06/13/18=17/24, 07/19/18 = 17/24    Time  12    Period  Weeks    Status  On-going    Target Date  08/29/18      PT LONG TERM GOAL #4   Title  Patient will ascend and descend 4 stairs with single rail UE support using a step-through pattern with SBA in order to demonstrate improved balance, safety, and decrease risk of falls.    Baseline  05/09/18=CGA/Min A for safety, step-to pattern, LUE support, 06/13/18=  LUE support and step to pattern, 07/19/18 needs UE support    Time  4     Period  Weeks    Status  On-going    Target Date  08/29/18            Plan - 07/24/18 1010    Clinical Impression Statement  Pt requires direction and verbal cues for correct performance of strengthening exercises.  Patient has fatigue with endurance and difficulty with increased repetitions and increased weights. Patient struggles with posture and ability to perform coordinated gait and transfers , as well as balance with mobility.  Pt encouraged continuing HEP   Patient will benefit from continued skilled PT to improve mobility and safety.    Rehab Potential  Good    PT Frequency  1x / week    PT Duration  12 weeks    PT Treatment/Interventions  Therapeutic activities;Therapeutic exercise;Balance training;Neuromuscular re-education;Functional mobility training;Gait training;Manual techniques    PT Next Visit Plan  progress balance and HEP    PT Home Exercise Plan  heel raises, bridges    Consulted and Agree with Plan of Care  Patient       Patient will benefit from skilled therapeutic intervention in order to improve the following deficits and impairments:  Abnormal gait, Decreased coordination, Decreased activity tolerance, Decreased endurance, Decreased strength, Decreased balance, Decreased safety awareness, Difficulty walking  Visit Diagnosis: Muscle weakness (generalized)  Neurologic neglect syndrome  Other lack of coordination  Unsteadiness on feet  Difficulty in walking, not elsewhere classified     Problem List Patient Active Problem List   Diagnosis Date Noted  . Sleep disturbance   . PAF (paroxysmal atrial fibrillation) ([place])   . Recurrent UTI   . Hypokalemia   . Hyponatremia   . Abnormal urine   . 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 Benign essential HTN   . Urinary retention   . History of gout   . Global aphasia   . SAH (subarachnoid hemorrhage) ([place])   . SDH (subdural hematoma) ([place])   . Lymphocytosis   . Subdural  hemorrhage following injury ([place]) 05/12/2016    Alanson [name], PT DPT 07/24/2018, 11:05 AM  [place] MAIN [facility] SERVICES [place], [place], [address] Phone: [phone]   Fax:  [phone]  Name: James [name] MRN: [id number] Date of Birth: 05/05/1941

## 2018-07-24 NOTE — Therapy (Signed)
[place] MAIN [facility] SERVICES [address] [place], [place], [address] Phone: [phone]   Fax:  [phone]  Occupational Therapy Treatment  Patient Details  Name: James [name] MRN: [id number] Date of Birth: Dec 19, 1940 No data recorded  Encounter Date: 07/24/2018  OT End of Session - 07/24/18 0937    Visit Number  20    Number of Visits  10    Date for OT Re-Evaluation  08/29/18    Authorization Type  Medicare visit 7/10    OT Start Time  0930    OT Stop Time  1015    OT Time Calculation (min)  45 min    Activity Tolerance  Patient tolerated treatment well    Behavior During Therapy  [facility] for tasks assessed/performed       Past Medical History:  Diagnosis Date  . Alcohol abuse, in remission   . Atrial fibrillation ([place])   . Bilateral renal masses   . Closed right ankle fracture   . 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 Gait disorder   . Gout   . Hypertension   . Seizure disorder ([place])   . 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     Past Surgical History:  Procedure Laterality Date  . CRANIECTOMY FOR DEPRESSED SKULL FRACTURE  2009   with MRSA infection  . CRANIOTOMY     X 2  . HERNIA REPAIR     during childhood  . ORIF ANKLE FRACTURE Left 2012    There were no vitals filed for this visit.  Subjective Assessment - 07/24/18 0936    Subjective   Wife reports patient spent about an hour yesterday using the iPad and trying to work with a stylus to move puzzle pieces on the right side of the screen into the template. Pt brought the iPad in and wants help with managing the stylus in the right hand.     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  He suffered another fall in 2017.  Since 2005  he has suffered from expressive aphasia.     Patient Stated Goals  to be able to use the right hand functionally    Currently in Pain?  No/denies    Multiple Pain Sites  No        Neuromuscular re education:  Patient seen this date for tool use with pen stylus to hold and operate iPad to move items from one location to the next.   Patient requires cues for tripod grasp to hold stylus and to keep contact with the screen.  His wife had the ads removed from The right side of the screen so now patient can work on this task without clicking onto ads and getting frustrated. He occasionally misses "picking up" the item on the right to move it and often can self correct and other times requires cues. Speed of task is slow but is improving with repetition of task and cues.    Manipulation of push pins to pick up from table, moderate difficulty with turning items to place into board.  If pins are placed into board He is able to remove them with cues for prehension patterns.  OT Education - 07/24/18 1751    Education provided  Yes    Education Details  use of stylus tool in right hand    Person(s) Educated  Patient;Spouse    Methods  Explanation;Demonstration;Verbal cues    Comprehension  Verbalized understanding;Returned demonstration;Verbal cues required          OT Long Term Goals - 06/12/18 1838      OT LONG TERM GOAL #1   Title  Patient will demonstrate use of utensil in right hand for self feeding with modified independence with little to no spillage.     Baseline  still has diff with spillage and managing utensils at times.    Time  12    Period  Weeks    Status  New      OT LONG TERM GOAL #2   Title  Pt will increase hand strength in right by 5# to be able to cut food with modified independence.    Baseline  unable at eval, able to cut meat with occasional assist at 30 visit.    Time  12    Period  Weeks    Status  Achieved      OT LONG TERM  GOAL #3   Title  Patient will demonstrate increased awareness and strategies to attend to right side to hold items without dropping items.    Baseline  drops items frequently, 30 visit-patient dropping objects less frequently and is more aware when holding objects in hand    Time  12    Period  Weeks    Status  On-going      OT LONG TERM GOAL #4   Title  Patient will demonstrate independence in home exercise program for ROM, strength and coordination of right hand.    Baseline  no current program, modified independent with current program at 30 visit but continuing to add exercises.    Time  6    Period  Weeks    Status  On-going      OT LONG TERM GOAL #5   Title  Patient will demonstrate the ability to obtain items from right pocket 90% of the time.    Baseline  difficulty getting keys or coins out of right pocket, 30 visit update-able to demonstrate 75% of the time    Time  12    Period  Weeks    Status  Achieved      OT LONG TERM GOAL #6   Title  Patient will demonstrate pouring a drink with the right hand with attention to right side and not knocking the cup over and spilling.      Baseline  patient often does not realize the cup is on his right side and knocks it over when attempting to get a drink    Time  12    Period  Weeks    Status  On-going            Plan - 07/24/18 1752    Clinical Impression Statement  Patient requires cues for right tripod grip on stylus, able to use for short periods of time and then when grip/fingers move he has a hard time determining when to adjust.  Cues to adjust and return to a tripod grasp rather than full finger grasp on tool.  He also worked towards using his finger to move items on the ipad from one location to another but requires cues at times to attend to right side occasionally missing intended  item.  Patient has progressed well with graded pressure demonstrated when using the stylus tool.  Continue to work toward goals.  Wife may be  having knee surgery in February and is planning to have her sister bring patient in to therapy.     Occupational Profile and client history currently impacting functional performance  repeated falls, TBI, expressive aphasia, limited motion in right hand, lack of coordination from previous CVA    Occupational performance deficits (Please refer to evaluation for details):  ADL's;IADL's;Leisure    Rehab Potential  Good    Current Impairments/barriers affecting progress:  expressive aphasia, falls, neglect    OT Frequency  1x / week    OT Duration  12 weeks    OT Treatment/Interventions  Self-care/ADL training;Visual/perceptual remediation/compensation;DME and/or AE instruction;Patient/family education;Moist Heat;Therapeutic exercise;Manual Therapy;Therapeutic activities;Neuromuscular education    Consulted and Agree with Plan of Care  Patient    Family Member Consulted  wife, [name]       Patient will benefit from skilled therapeutic intervention in order to improve the following deficits and impairments:  Impaired vision/preception, Decreased coordination, Decreased safety awareness, Impaired UE functional use, Decreased strength  Visit Diagnosis: Muscle weakness (generalized)  Other lack of coordination  Neurologic neglect syndrome    Problem List Patient Active Problem List   Diagnosis Date Noted  . Sleep disturbance   . PAF (paroxysmal atrial fibrillation) ([place])   . Recurrent UTI   . Hypokalemia   . Hyponatremia   . Abnormal urine   . 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 Benign essential HTN   . Urinary retention   . History of gout   . Global aphasia   . SAH (subarachnoid hemorrhage) ([place])   . SDH (subdural hematoma) ([place])   . Lymphocytosis   . Subdural hemorrhage following injury [facility]) 05/12/2016   Lasha Echeverria T Irvin Bastin, OTR/L, CLT Kiyani Jernigan 07/24/2018, 5:56 PM  [place] MAIN [facility] SERVICES [address]  [place], [place], [address] Phone: [phone]   Fax:  [phone]  Name: James [name] MRN: [id number] Date of Birth: 1940-10-02

## 2018-08-01 ENCOUNTER — Encounter: Payer: Self-pay | Admitting: Physical Therapy

## 2018-08-01 ENCOUNTER — Ambulatory Visit: Payer: PPO | Admitting: Physical Therapy

## 2018-08-01 ENCOUNTER — Ambulatory Visit: Payer: PPO | Attending: Neurology | Admitting: Occupational Therapy

## 2018-08-01 DIAGNOSIS — R2681 Unsteadiness on feet: Secondary | ICD-10-CM

## 2018-08-01 DIAGNOSIS — R262 Difficulty in walking, not elsewhere classified: Secondary | ICD-10-CM | POA: Insufficient documentation

## 2018-08-01 DIAGNOSIS — R278 Other lack of coordination: Secondary | ICD-10-CM | POA: Diagnosis not present

## 2018-08-01 DIAGNOSIS — R414 Neurologic neglect syndrome: Secondary | ICD-10-CM

## 2018-08-01 DIAGNOSIS — M6281 Muscle weakness (generalized): Secondary | ICD-10-CM | POA: Insufficient documentation

## 2018-08-01 NOTE — Therapy (Signed)
[place] MAIN [facility] SERVICES [address] [place], [place], [address] Phone: [phone]   Fax:  [phone]  Occupational Therapy Treatment  Patient Details  Name: James [name] MRN: [id number] Date of Birth: Oct 26, 1940 No data recorded  Encounter Date: 08/01/2018  OT End of Session - 08/10/18 1742    Visit Number  61    Number of Visits  73    Date for OT Re-Evaluation  08/29/18    Authorization Type  Medicare visit 8/10    OT Start Time  0930    OT Stop Time  1015    OT Time Calculation (min)  45 min    Activity Tolerance  Patient tolerated treatment well    Behavior During Therapy  [facility] for tasks assessed/performed       Past Medical History:  Diagnosis Date  . Alcohol abuse, in remission   . Atrial fibrillation ([place])   . Bilateral renal masses   . Closed right ankle fracture   . 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 Gait disorder   . Gout   . Hypertension   . Seizure disorder ([place])   . 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     Past Surgical History:  Procedure Laterality Date  . CRANIECTOMY FOR DEPRESSED SKULL FRACTURE  2009   with MRSA infection  . CRANIOTOMY     X 2  . HERNIA REPAIR     during childhood  . ORIF ANKLE FRACTURE Left 2012    There were no vitals filed for this visit.  Subjective Assessment - 08/10/18 1742    Subjective   Patient reports he is doing well, been trying to use his right hand for tasks at home and with the IPad.     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  He suffered another fall in 2017.  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     Patient Stated Goals  to be able to use the right hand functionally    Currently in Pain?  No/denies    Multiple Pain Sites  No         Patient seen this date for manipulation of 100 pegboard pieces to place into grid following moderately complex design copy with emphasis on  Right sided awareness.  Verbal cues required at times for prehension patterns to pick up pieces with middle finger and thumb and to facilitate use Of index finger to turn objects to place in a vertical position to put in the board.  He required one cue for the design on the right.    Patient also seen for handwriting skills with printed and script name on smaller lines in a sample check. Legibility remains fair to poor but is improving, he does best with his first name.  Cues for tripod grasp on pen And he performs better with medium built up pen handle than regular pen.  Multiple trials completed.                    OT Education - 08/10/18 1750    Education provided  Yes    Education Details  fine motor coordination skills, scanning environment    Person(s) Educated  Patient;Spouse    Methods  Explanation;Demonstration;Verbal cues    Comprehension  Verbalized understanding;Returned demonstration;Verbal cues required          OT Long Term Goals - 08/10/18 1753      OT LONG TERM GOAL #1   Title  Patient will demonstrate use of utensil in right hand for self feeding with modified independence with little to no spillage.     Baseline  still has diff with spillage and managing utensils at times.    Time  12    Period  Weeks    Status  On-going      OT LONG TERM GOAL #2   Title  Pt will increase hand strength in right by 5# to be able to cut food with modified independence.    Baseline  unable at eval, able to cut meat with occasional assist at 30 visit.    Time  12    Period  Weeks    Status  Achieved      OT LONG TERM GOAL #3   Title  Patient will demonstrate increased awareness and strategies to attend to right side to hold items without dropping items.    Baseline  drops items frequently,  30 visit-patient dropping objects less frequently and is more aware when holding objects in hand    Time  12    Period  Weeks    Status  On-going      OT LONG TERM GOAL #4   Title  Patient will demonstrate independence in home exercise program for ROM, strength and coordination of right hand.    Baseline  no current program, modified independent with current program at 30 visit but continuing to add exercises.    Time  6    Period  Weeks    Status  On-going      OT LONG TERM GOAL #5   Title  Patient will demonstrate the ability to obtain items from right pocket 90% of the time.    Baseline  difficulty getting keys or coins out of right pocket, 30 visit update-able to demonstrate 75% of the time    Time  12    Period  Weeks    Status  Achieved      OT LONG TERM GOAL #6   Title  Patient will demonstrate pouring a drink with the right hand with attention to right side and not knocking the cup over and spilling.      Baseline  patient often does not realize the cup is on his right side and knocks it over when attempting to get a drink    Time  12    Period  Weeks    Status  On-going            Plan - 08/10/18 1743    Clinical Impression Statement  Patient continues to progress with right hand skills, he is becoming more consistent with modulation of grip and is able to demonstrate adjustments to his graded grasp based on the object used.  He occasionally requires cues to lessen his grip if he is really focused on a task and is becoming frustrated.  Once cued, he is able to adjust.  Patient continues to progress towards goals to improve hand function and use in daily tasks.  Recommend looking at tool use next session requiring both hands to complete task.      Occupational Profile and client history currently impacting functional performance  repeated falls, TBI, expressive aphasia,  limited motion in right hand, lack of coordination from previous CVA    Occupational performance  deficits (Please refer to evaluation for details):  ADL's;IADL's;Leisure    Rehab Potential  Good    OT Frequency  1x / week    OT Duration  12 weeks    OT Treatment/Interventions  Self-care/ADL training;Visual/perceptual remediation/compensation;DME and/or AE instruction;Patient/family education;Moist Heat;Therapeutic exercise;Manual Therapy;Therapeutic activities;Neuromuscular education    Consulted and Agree with Plan of Care  Patient    Family Member Consulted  wife, [name]       Patient will benefit from skilled therapeutic intervention in order to improve the following deficits and impairments:  Impaired vision/preception, Decreased coordination, Decreased safety awareness, Impaired UE functional use, Decreased strength  Visit Diagnosis: Muscle weakness (generalized)  Neurologic neglect syndrome  Other lack of coordination    Problem List Patient Active Problem List   Diagnosis Date Noted  . Sleep disturbance   . PAF (paroxysmal atrial fibrillation) ([place])   . Recurrent UTI   . Hypokalemia   . Hyponatremia   . Abnormal urine   . 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 Benign essential HTN   . Urinary retention   . History of gout   . Global aphasia   . SAH (subarachnoid hemorrhage) ([place])   . SDH (subdural hematoma) ([place])   . Lymphocytosis   . Subdural hemorrhage following injury [facility]) 05/12/2016   Amy T Lovett, OTR/L, CLT  Lovett,Amy 08/10/2018, 5:54 PM  [place] MAIN [facility] SERVICES [address] [place], [place], [address] Phone: [phone]   Fax:  [phone]  Name: James [name] MRN: [id number] Date of Birth: 01-10-1941

## 2018-08-01 NOTE — Therapy (Addendum)
[place] MAIN [facility] SERVICES [address] [place], [place], [address] Phone: [phone]   Fax:  [phone]  Physical Therapy Treatment  Patient Details  Name: James [name] MRN: [id number] Date of Birth: Feb 04, 1941 Referring Provider (PT): shah   Encounter Date: 08/01/2018  PT End of Session - 08/01/18 1009    Visit Number  35    Number of Visits  49    Date for PT Re-Evaluation  08/29/18    Authorization Type  5/10 starting on 06/13/18 progress note    PT Start Time  1015    PT Stop Time  1055    PT Time Calculation (min)  40 min    Equipment Utilized During Treatment  Gait belt    Activity Tolerance  Patient tolerated treatment well    Behavior During Therapy  WFL for tasks assessed/performed       Past Medical History:  Diagnosis Date  . Alcohol abuse, in remission   . Atrial fibrillation ([place])   . Bilateral renal masses   . Closed right ankle fracture   . 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 Gait disorder   . Gout   . Hypertension   . Seizure disorder ([place])   . 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     Past Surgical History:  Procedure Laterality Date  . CRANIECTOMY FOR DEPRESSED SKULL FRACTURE  2009   with MRSA infection  . CRANIOTOMY     X 2  . HERNIA REPAIR     during childhood  . ORIF ANKLE FRACTURE Left 2012    There were no vitals filed for this visit.  Subjective Assessment - 08/01/18 1009    Subjective  Patient denies any falls or pain since last session. 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  He suffered another fall in 2017.  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    Patient Stated Goals  Patient wants to have a safer gait  and not stagger or feel off balance.     Currently in Pain?  No/denies    Multiple Pain Sites  No      Treatment: [employer] fitness x 5 mins   Rockerboard Lateral weight shift x10 reps each direction, no UE support, CGA for safety with VCs to control the board tap in each direction and not letting it hit too hard   AP weight shift x10 reps, no UE support, CGA for safety with VCs to utilize ankles to transfer weight over toes and back over heels without just leaning the trunk   Standing on 1/2 bolster (Flat side up): Heel/toe rock 2x10 with finger tip hold for balance with cues to keep knee straight for better ankle strategies;   Stepping over 2 hurdle fwd side to side x 10   Stepping over 1 hurdle bwd x10 cues for posture, increased hip flexion   Standing hip abd without resistance due to weakness 2x5 bilaterally   Standing hip extension without GTB due to increased fatigue today 2x5 BLE   Agility ladder Step out, out, in, in forwards x2 laps, backwards x2 laps, CGA for safety, VCs to take big enough steps and  to try to increase speed to work on coordination   Side stepping x1 lap each direction, CGA for safety, VCs for taking a big enough step to get both feet into the square    Patient needs mod assist and vc for improved motor control and coordination.                       PT Education - 08/01/18 1009    Education Details  HEP    Person(s) Educated  Patient    Methods  Demonstration    Comprehension  Returned demonstration;Need further instruction       PT Short Term Goals - 06/13/18 0912      PT SHORT TERM GOAL #1   Title  Patient will be independent in home exercise program to improve strength/mobility for better functional independence with ADLs.    Baseline  05/09/2018: Pt reports doing bed exercises, 06/13/18  Pt reports doing bed exercises    Time  6    Period  Weeks    Status  Partially Met    Target Date  08/29/18      PT SHORT TERM GOAL #2    Title  Patient (> 59 years old) will complete five times sit to stand test in < 15 seconds indicating an increased LE strength and improved balance.    Baseline  19.15 sec and needs CGA to min assist to keep from falling, 03/29/18=16.82 sec, 05/09/2018 15.23sec11/20/19=13.73    Time  6    Period  Weeks    Status  Partially Met    Target Date  08/29/18        PT Long Term Goals - 07/19/18 1003      PT LONG TERM GOAL #1   Title  Patient will increase Berg Balance score by > 6 points to demonstrate decreased fall risk during functional activities.    Baseline  36/56 Berg,;  03/29/18 Berg = 36/52; 05/09/18= 45/56, 06/13/18=43/56, 07/19/18= 44/56    Time  12    Period  Weeks    Status  On-going    Target Date  08/29/18      PT LONG TERM GOAL #2   Title  Patient will reduce timed up and go to <11 seconds to reduce fall risk and demonstrate improved transfer/gait ability.    Baseline  15.18 sec with poor safety judgement and uncontrolled sit, 03/29/18=13.02 sec, 05/09/2018 10.53 sec, 06/13/18=9.48 sec, 07/19/18  10.52    Time  12    Period  Weeks    Status  On-going    Target Date  08/29/18      PT LONG TERM GOAL #3   Title  Patient will increase dynamic gait index score to >19/24 as to demonstrate reduced fall risk and improved dynamic gait balance for better safety with community/home ambulation.     Baseline  16/24, 03/29/18=16/25; 05/09/18=17/24, 06/13/18=17/24, 07/19/18 = 17/24    Time  12    Period  Weeks    Status  On-going    Target Date  08/29/18      PT LONG TERM GOAL #4   Title  Patient will ascend and descend 4 stairs with single rail UE support using a step-through pattern with SBA in order to demonstrate improved balance, safety, and decrease risk of falls.    Baseline  05/09/18=CGA/Min A for safety, step-to pattern, LUE support, 06/13/18= LUE support and step to pattern, 07/19/18 needs UE support    Time  4    Period  Weeks    Status  On-going    Target Date  08/29/18             Plan - 08/01/18 1034    Clinical Impression Statement  Patient demonstrates improved stability and strength allowing patient to perform longer duration standing interventions with rest periods.  Patient performs beginning standing dynamic standing balance exercises to shift weight and perform single leg standing activities with mod assist. Patient fatigues quickly with exercises requiring rest breaks at this time. Patient will continue to benefit from skilled physical therapy to improve pain and mobility.    Rehab Potential  Good    PT Frequency  1x / week    PT Duration  12 weeks    PT Treatment/Interventions  Therapeutic activities;Therapeutic exercise;Balance training;Neuromuscular re-education;Functional mobility training;Gait training;Manual techniques    PT Next Visit Plan  progress balance and HEP    PT Home Exercise Plan  heel raises, bridges    Consulted and Agree with Plan of Care  Patient       Patient will benefit from skilled therapeutic intervention in order to improve the following deficits and impairments:  Abnormal gait, Decreased coordination, Decreased activity tolerance, Decreased endurance, Decreased strength, Decreased balance, Decreased safety awareness, Difficulty walking  Visit Diagnosis: Muscle weakness (generalized)  Neurologic neglect syndrome  Other lack of coordination  Unsteadiness on feet  Difficulty in walking, not elsewhere classified     Problem List Patient Active Problem List   Diagnosis Date Noted  . Sleep disturbance   . PAF (paroxysmal atrial fibrillation) ([place])   . Recurrent UTI   . Hypokalemia   . Hyponatremia   . Abnormal urine   . 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 Benign essential HTN   . Urinary retention   . History of gout   . Global aphasia   . SAH (subarachnoid hemorrhage) ([place])   . SDH (subdural hematoma) ([place])   . Lymphocytosis   . Subdural hemorrhage following injury ([place]) 05/12/2016     Alanson Puls, PT DPT 08/01/2018, 10:35 AM  [place] MAIN [facility] SERVICES [address] [place], [place], [address] Phone: [phone]   Fax:  [phone]  Name: James [name] MRN: [id number] Date of Birth: 1941/04/14

## 2018-08-08 ENCOUNTER — Encounter: Payer: Self-pay | Admitting: Physical Therapy

## 2018-08-08 ENCOUNTER — Ambulatory Visit: Payer: PPO | Admitting: Occupational Therapy

## 2018-08-08 ENCOUNTER — Ambulatory Visit: Payer: PPO | Admitting: Physical Therapy

## 2018-08-08 DIAGNOSIS — R2681 Unsteadiness on feet: Secondary | ICD-10-CM

## 2018-08-08 DIAGNOSIS — M6281 Muscle weakness (generalized): Secondary | ICD-10-CM

## 2018-08-08 DIAGNOSIS — R278 Other lack of coordination: Secondary | ICD-10-CM

## 2018-08-08 DIAGNOSIS — R414 Neurologic neglect syndrome: Secondary | ICD-10-CM

## 2018-08-08 DIAGNOSIS — R262 Difficulty in walking, not elsewhere classified: Secondary | ICD-10-CM

## 2018-08-08 NOTE — Therapy (Signed)
[place] MAIN [facility] SERVICES [address] [place], [place], [address] Phone: [phone]   Fax:  [phone]  Physical Therapy Treatment  Patient Details  Name: [place] [name] MRN: [id number] Date of Birth: [birth year] Referring Provider (PT): shah   Encounter Date: 08/08/2018  PT End of Session - 08/08/18 0855    Visit Number  36    Number of Visits  20    Date for PT Re-Evaluation  08/29/18    Authorization Type  6/10 starting on 06/13/18 progress note    PT Start Time  0845    PT Stop Time  0930    PT Time Calculation (min)  45 min    Equipment Utilized During Treatment  Gait belt    Activity Tolerance  Patient tolerated treatment well    Behavior During Therapy  WFL for tasks assessed/performed       Past Medical History:  Diagnosis Date  . Alcohol abuse, in remission   . Atrial fibrillation ([place])   . Bilateral renal masses   . Closed right ankle fracture   . 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 Gait disorder   . Gout   . Hypertension   . Seizure disorder ([place])   . 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     Past Surgical History:  Procedure Laterality Date  . CRANIECTOMY FOR DEPRESSED SKULL FRACTURE  2009   with MRSA infection  . CRANIOTOMY     X 2  . HERNIA REPAIR     during childhood  . ORIF ANKLE FRACTURE Left 2012    There were no vitals filed for this visit.  Subjective Assessment - 08/08/18 0854    Subjective  Patient denies any falls or pain since last session. 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  He suffered another fall in 2017.  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    Patient Stated Goals  Patient wants to have a safer gait  and not stagger or feel off balance.     Currently in Pain?  No/denies    Multiple Pain Sites  No       Review HEP: Heel raises x20 reps bilaterally, BUE support in // bars, supervision for safety Marching x15 reps each leg, BUE support in // bars, supervision for safety Feet together eyes open, x10 sec holds  x2 bouts  supervision for safety, no UE support Feet  together eyes closed, x10 sec holds  x2 bouts supervision for safety, no UE support VCs for proper technique and positioning for each exercise   Rockerboard Lateral weight shift x10 reps each direction, 50% UE support, CGA for safety with VCs to control the board tap in each direction and not letting it hit too hard  AP weight shift x10 reps, no UE support, CGA for safety with VCs to utilize ankles to transfer weight over toes and back over heels without just leaning the trunk  Four square: Step out, out, in, in forwards x2 laps, backwards x2 laps, CGA for safety, VCs to take big enough steps and to try to increase speed to work on coordination  Side stepping x1 lap each direction,  CGA for safety, VCs for taking a big enough step to get both feet into the square  Gait out in hallway, CGA for all activities with VCs for maintaining gait speed and step length with activities: Horizontal head turns x160 ft, calling out cards as walking to give point of focus Vertical head turns x100 ft Direction changes 100 ft, some difficulty with changing directions quickly and beginning to walk backwards  Supine hooklying hip abd/ER x 20  SLR x 10 x 2 Hip abd in sidelying x 10 x 2 ; poor motor control and assist needed for correct positioning of leg during exercises Bridging x 10 x 2, cues for correct LE positioning   CGA and  mod verbal cues used throughout with increased in postural sway and LOB most seen with narrow base of support and while on uneven surfaces. Continues to have balance deficits typical with diagnosis. Patient performs  intermediate level exercises without pain behaviors and needs verbal cuing for postural alignment and head positioning                 PT Education - 08/08/18 0854    Education Details  HEP, safety    Person(s) Educated  Patient    Methods  Explanation;Tactile cues;Verbal cues    Comprehension  Returned demonstration;Need further instruction       PT Short Term Goals - 06/13/18 0912      PT SHORT TERM GOAL #1   Title  Patient will be independent in home exercise program to improve strength/mobility for better functional independence with ADLs.    Baseline  05/09/2018: Pt reports doing bed exercises, 06/13/18  Pt reports doing bed exercises    Time  6    Period  Weeks    Status  Partially Met    Target Date  08/29/18      PT SHORT TERM GOAL #2   Title  Patient (> 8 years old) will complete five times sit to stand test in < 15 seconds indicating an increased LE strength and improved balance.    Baseline  19.15 sec and needs CGA to min assist to keep from falling, 03/29/18=16.82 sec, 05/09/2018 15.23sec11/20/19=13.73    Time  6    Period  Weeks    Status  Partially Met    Target Date  08/29/18        PT Long Term Goals - 07/19/18 1003      PT LONG TERM GOAL #1   Title  Patient will increase Berg Balance score by > 6 points to demonstrate decreased fall risk during functional activities.    Baseline  36/56 Berg,;  03/29/18 Berg = 36/52; 05/09/18= 45/56, 06/13/18=43/56, 07/19/18= 44/56    Time  12    Period  Weeks    Status  On-going    Target Date  08/29/18      PT LONG TERM GOAL #2   Title  Patient will reduce timed up and go to <11 seconds to reduce fall risk and demonstrate improved transfer/gait ability.    Baseline  15.18 sec with poor safety judgement and uncontrolled sit, 03/29/18=13.02 sec, 05/09/2018 10.53 sec, 06/13/18=9.48 sec, 07/19/18  10.52    Time  12    Period  Weeks    Status  On-going    Target Date  08/29/18      PT LONG TERM GOAL #3   Title   Patient will increase dynamic gait index score to >19/24 as to demonstrate reduced fall risk  and improved dynamic gait balance for better safety with community/home ambulation.     Baseline  16/24, 03/29/18=16/25; 05/09/18=17/24, 06/13/18=17/24, 07/19/18 = 17/24    Time  12    Period  Weeks    Status  On-going    Target Date  08/29/18      PT LONG TERM GOAL #4   Title  Patient will ascend and descend 4 stairs with single rail UE support using a step-through pattern with SBA in order to demonstrate improved balance, safety, and decrease risk of falls.    Baseline  05/09/18=CGA/Min A for safety, step-to pattern, LUE support, 06/13/18= LUE support and step to pattern, 07/19/18 needs UE support    Time  4    Period  Weeks    Status  On-going    Target Date  08/29/18            Plan - 08/08/18 0856    Clinical Impression Statement  Patient demonstrates LOB with standing balance exercises indicating decreased balancing strategies and ataxic movements in all extremities.  Patient did require  CGA support to perform sidestepping up and over exercise. Patient will benefit from further skilled therapy to return to prior level of function. .  Pt was encouraged to perform HEP during the week to continue progressing balance and strength interventions.  Pt would continue to benefit from skilled therapy services to further address LE strength deficits and balance deficits in order to decrease fall risk and improve mobility.    Rehab Potential  Good    PT Frequency  1x / week    PT Duration  12 weeks    PT Treatment/Interventions  Therapeutic activities;Therapeutic exercise;Balance training;Neuromuscular re-education;Functional mobility training;Gait training;Manual techniques    PT Next Visit Plan  progress balance and HEP    PT Home Exercise Plan  heel raises, bridges    Consulted and Agree with Plan of Care  Patient       Patient will benefit from skilled therapeutic intervention in order to  improve the following deficits and impairments:  Abnormal gait, Decreased coordination, Decreased activity tolerance, Decreased endurance, Decreased strength, Decreased balance, Decreased safety awareness, Difficulty walking  Visit Diagnosis: Muscle weakness (generalized)  Neurologic neglect syndrome  Other lack of coordination  Unsteadiness on feet  Difficulty in walking, not elsewhere classified     Problem List Patient Active Problem List   Diagnosis Date Noted  . Sleep disturbance   . PAF (paroxysmal atrial fibrillation) ([place])   . Recurrent UTI   . Hypokalemia   . Hyponatremia   . Abnormal urine   . 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 Benign essential HTN   . Urinary retention   . History of gout   . Global aphasia   . SAH (subarachnoid hemorrhage) ([place])   . SDH (subdural hematoma) ([place])   . Lymphocytosis   . Subdural hemorrhage following injury ([place]) 05/12/2016    Alanson Puls, PT DPT 08/08/2018, 9:16 AM  [place] MAIN [facility] SERVICES [address] [place], [place], [address] Phone: [phone]   Fax:  [phone]  Name: Trindon Dorton MRN: [id number] Date of Birth: [birth year]

## 2018-08-10 ENCOUNTER — Encounter: Payer: Self-pay | Admitting: Occupational Therapy

## 2018-08-10 NOTE — Therapy (Signed)
[place] MAIN [facility] SERVICES [address] [place], [place], [address] Phone: [phone]   Fax:  [phone]  Occupational Therapy Treatment  Patient Details  Name: [place] [name] MRN: [id number] Date of Birth: 06-Dec-1940 No data recorded  Encounter Date: 08/08/2018  OT End of Session - 08/10/18 1804    Visit Number  52    Number of Visits  83    Date for OT Re-Evaluation  08/29/18    Authorization Type  Medicare visit 9/10    OT Start Time  0930    OT Stop Time  1015    OT Time Calculation (min)  45 min    Activity Tolerance  Patient tolerated treatment well    Behavior During Therapy  [facility] for tasks assessed/performed       Past Medical History:  Diagnosis Date  . Alcohol abuse, in remission   . Atrial fibrillation ([place])   . Bilateral renal masses   . Closed right ankle fracture   . 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 Gait disorder   . Gout   . Hypertension   . Seizure disorder ([place])   . 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     Past Surgical History:  Procedure Laterality Date  . CRANIECTOMY FOR DEPRESSED SKULL FRACTURE  2009   with MRSA infection  . CRANIOTOMY     X 2  . HERNIA REPAIR     during childhood  . ORIF ANKLE FRACTURE Left 2012    There were no vitals filed for this visit.  Subjective Assessment - 08/10/18 1803    Subjective   Pt indicates he is doing well, laughing during session.      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  He suffered another fall in 2017.  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     Patient Stated Goals  to be able to use the right hand functionally    Currently in Pain?  No/denies    Multiple Pain Sites  No        Neuromuscular Reeducation:  Patient seen this date  with emphasis on tool use and tool bench to apply large nuts onto bolts requiring the use of an adjustable wrench and then switching to  Use of a hex tool for other bolts.  Patient demonstrates use of right hand to hold wrench but cannot isolate fingers enough to adjust the width of the wrench without using 2 hands He can turn the wrench but requires frequent adjustment to grip and placement onto bolt.   When using hex tool, he requires long end of tool in hand with cues and occasional guiding, unable to place tool in hex bolt and use short end with fingers.   Moderate cues and assist with tool tasks this date.   Unknotting exercise:  Patient  Required to unknot knots placed in medium sized nylon rope with use of bilateral hands to complete task. Cues to allow right hand to lead task.  Improved speed this date than in sessions in the past when this task was performed.                    OT Education - 08/10/18 1804    Education provided  Yes  Education Details  tool use at home    Person(s) Educated  Patient;Spouse    Methods  Explanation;Demonstration;Verbal cues    Comprehension  Verbalized understanding;Returned demonstration;Verbal cues required          OT Long Term Goals - 08/10/18 1753      OT LONG TERM GOAL #1   Title  Patient will demonstrate use of utensil in right hand for self feeding with modified independence with little to no spillage.     Baseline  still has diff with spillage and managing utensils at times.    Time  12    Period  Weeks    Status  On-going      OT LONG TERM GOAL #2   Title  Pt will increase hand strength in right by 5# to be able to cut food with modified independence.    Baseline  unable at eval, able to cut meat with occasional assist at 30 visit.    Time  12    Period  Weeks    Status  Achieved      OT LONG TERM GOAL #3   Title  Patient will demonstrate increased awareness and strategies to attend to right side to hold items  without dropping items.    Baseline  drops items frequently, 30 visit-patient dropping objects less frequently and is more aware when holding objects in hand    Time  12    Period  Weeks    Status  On-going      OT LONG TERM GOAL #4   Title  Patient will demonstrate independence in home exercise program for ROM, strength and coordination of right hand.    Baseline  no current program, modified independent with current program at 30 visit but continuing to add exercises.    Time  6    Period  Weeks    Status  On-going      OT LONG TERM GOAL #5   Title  Patient will demonstrate the ability to obtain items from right pocket 90% of the time.    Baseline  difficulty getting keys or coins out of right pocket, 30 visit update-able to demonstrate 75% of the time    Time  12    Period  Weeks    Status  Achieved      OT LONG TERM GOAL #6   Title  Patient will demonstrate pouring a drink with the right hand with attention to right side and not knocking the cup over and spilling.      Baseline  patient often does not realize the cup is on his right side and knocks it over when attempting to get a drink    Time  12    Period  Weeks    Status  On-going            Plan - 08/10/18 1805    Clinical Impression Statement  Patient demonstrates difficulty with tool use with tool in right hand, he is able to demonstrate a secure grip at times with cues but when he gets focused on the task and the outcome he often loses his grip and requires cues to adjust.  At times its verbal cues, other times it requires physical assist to change his position of this grip on the tool.  He continues to work towards improved funtional use of the right hand, patient and wife pleased with progress however patient gets frustrated at times with more complex tasks.  Occupational Profile and client history currently impacting functional performance  repeated falls, TBI, expressive aphasia, limited motion in right hand,  lack of coordination from previous CVA    Occupational performance deficits (Please refer to evaluation for details):  ADL's;IADL's;Leisure    Rehab Potential  Good    OT Frequency  1x / week    OT Duration  12 weeks    OT Treatment/Interventions  Self-care/ADL training;Visual/perceptual remediation/compensation;DME and/or AE instruction;Patient/family education;Moist Heat;Therapeutic exercise;Manual Therapy;Therapeutic activities;Neuromuscular education    Consulted and Agree with Plan of Care  Patient    Family Member Consulted  wife, [name]       Patient will benefit from skilled therapeutic intervention in order to improve the following deficits and impairments:  Impaired vision/preception, Decreased coordination, Decreased safety awareness, Impaired UE functional use, Decreased strength  Visit Diagnosis: Muscle weakness (generalized)  Neurologic neglect syndrome  Other lack of coordination    Problem List Patient Active Problem List   Diagnosis Date Noted  . Sleep disturbance   . PAF (paroxysmal atrial fibrillation) ([place])   . Recurrent UTI   . Hypokalemia   . Hyponatremia   . Abnormal urine   . 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 Benign essential HTN   . Urinary retention   . History of gout   . Global aphasia   . SAH (subarachnoid hemorrhage) ([place])   . SDH (subdural hematoma) ([place])   . Lymphocytosis   . Subdural hemorrhage following injury ([place]) 05/12/2016   Lurline Caver T Tregan Read, OTR/L, CLT  Dhyana Bastone 08/10/2018, 6:09 PM  [place] MAIN [facility] SERVICES [address] [place], [place], [address] Phone: [phone]   Fax:  [phone]  Name: [place] [name] MRN: [id number] Date of Birth: February 06, 1941

## 2018-08-15 ENCOUNTER — Encounter: Payer: Self-pay | Admitting: Physical Therapy

## 2018-08-15 ENCOUNTER — Ambulatory Visit: Payer: PPO | Admitting: Occupational Therapy

## 2018-08-15 ENCOUNTER — Encounter: Payer: Self-pay | Admitting: Occupational Therapy

## 2018-08-15 ENCOUNTER — Ambulatory Visit: Payer: PPO | Admitting: Physical Therapy

## 2018-08-15 DIAGNOSIS — R278 Other lack of coordination: Secondary | ICD-10-CM

## 2018-08-15 DIAGNOSIS — R262 Difficulty in walking, not elsewhere classified: Secondary | ICD-10-CM

## 2018-08-15 DIAGNOSIS — R414 Neurologic neglect syndrome: Secondary | ICD-10-CM

## 2018-08-15 DIAGNOSIS — M6281 Muscle weakness (generalized): Secondary | ICD-10-CM

## 2018-08-15 DIAGNOSIS — R2681 Unsteadiness on feet: Secondary | ICD-10-CM

## 2018-08-15 NOTE — Therapy (Signed)
[place] MAIN [facility] SERVICES [address] [place], [place], [address] Phone: [phone]   Fax:  [phone]  Physical Therapy Treatment  Patient Details  Name: James [name] MRN: [id number] Date of Birth: 1941/01/24 Referring Provider (PT): shah   Encounter Date: 08/15/2018  PT End of Session - 08/15/18 0920    Visit Number  37    Number of Visits  49    Date for PT Re-Evaluation  08/29/18    Authorization Type  7/10 starting on 06/13/18 progress note    PT Start Time  0915    PT Stop Time  0955    PT Time Calculation (min)  40 min    Equipment Utilized During Treatment  Gait belt    Activity Tolerance  Patient tolerated treatment well    Behavior During Therapy  WFL for tasks assessed/performed       Past Medical History:  Diagnosis Date  . Alcohol abuse, in remission   . Atrial fibrillation ([place])   . Bilateral renal masses   . Closed right ankle fracture   . 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 Gait disorder   . Gout   . Hypertension   . Seizure disorder ([place])   . 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     Past Surgical History:  Procedure Laterality Date  . CRANIECTOMY FOR DEPRESSED SKULL FRACTURE  2009   with MRSA infection  . CRANIOTOMY     X 2  . HERNIA REPAIR     during childhood  . ORIF ANKLE FRACTURE Left 2012    There were no vitals filed for this visit.  Subjective Assessment - 08/15/18 0919    Subjective  Patient denies any falls or pain since last session. 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  He suffered another fall in 2017.  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    Patient Stated Goals  Patient wants to have a safer gait  and not stagger or feel off balance.     Currently in Pain?  No/denies    Multiple Pain Sites  No         TREATMENT:   Advanced HEP: Standing with rail support for balance: -heel/toe raises x15 with min VCs to avoid trunk lean for less compensation; -mini squat with fingertip hold x15 reps with min VCs for positioning for quad strengthening;    sitting: Green tband BLE: Knee flex  x15 bilaterally; Knee ext x15 bilaterally; Patient required min-moderate verbal/tactile cues for correct exercise technique.                Standing in parallel bars:  Standing on floor alternate toe taps to 4 inch step with airex, with 2-1 rail assist x15 reps bilaterally; Standing one foot on floor, one foot on 4 inch step ,head turns t x 15reps each foot in step, with min A for safety and cues for weight shift for better stance control; forward step ups from airex to 4inch step with airex x5 reps each LE leading with 2-1 rail assist; does require increased time and cues for weight shift for better step negotiation; modified tandem stance: head  turns up/down, side/side, up/down x5 reps each foot in front Standing on foam and BUE shouler press with 2 lb dowell  Patient required min VCs for balance stability, including to increase trunk control for less loss of balance with smaller base of support   Weaving around cones #5 x3 laps with min A -supervision for safety; required min VCs to increase step length and increase turn for better cone negotiation; able to exhibit good walker placement but does have increased foot drag with increased repetition.   CGA and Min to mod verbal cues used throughout with increased in postural sway and LOB most seen with narrow base of support and while on uneven surfaces. Continues to have balance deficits typical with diagnosis                   PT Education - 08/15/18 0919    Education Details  HEP    Person(s) Educated  Patient    Methods  Explanation     Comprehension  Returned demonstration;Need further instruction       PT Short Term Goals - 06/13/18 0912      PT SHORT TERM GOAL #1   Title  Patient will be independent in home exercise program to improve strength/mobility for better functional independence with ADLs.    Baseline  05/09/2018: Pt reports doing bed exercises, 06/13/18  Pt reports doing bed exercises    Time  6    Period  Weeks    Status  Partially Met    Target Date  08/29/18      PT SHORT TERM GOAL #2   Title  Patient (> 78 years old) will complete five times sit to stand test in < 15 seconds indicating an increased LE strength and improved balance.    Baseline  19.15 sec and needs CGA to min assist to keep from falling, 03/29/18=16.82 sec, 05/09/2018 15.23sec11/20/19=13.73    Time  6    Period  Weeks    Status  Partially Met    Target Date  08/29/18        PT Long Term Goals - 07/19/18 1003      PT LONG TERM GOAL #1   Title  Patient will increase Berg Balance score by > 6 points to demonstrate decreased fall risk during functional activities.    Baseline  36/56 Berg,;  03/29/18 Berg = 36/52; 05/09/18= 45/56, 06/13/18=43/56, 07/19/18= 44/56    Time  12    Period  Weeks    Status  On-going    Target Date  08/29/18      PT LONG TERM GOAL #2   Title  Patient will reduce timed up and go to <11 seconds to reduce fall risk and demonstrate improved transfer/gait ability.    Baseline  15.18 sec with poor safety judgement and uncontrolled sit, 03/29/18=13.02 sec, 05/09/2018 10.53 sec, 06/13/18=9.48 sec, 07/19/18  10.52    Time  12    Period  Weeks    Status  On-going    Target Date  08/29/18      PT LONG TERM GOAL #3   Title  Patient will increase dynamic gait index score to >19/24 as to demonstrate reduced fall risk and improved dynamic gait balance for better safety with community/home ambulation.     Baseline  16/24, 03/29/18=16/25; 05/09/18=17/24, 06/13/18=17/24, 07/19/18 = 17/24    Time  12    Period  Weeks     Status  On-going    Target Date  08/29/18      PT LONG TERM GOAL #4   Title  Patient will ascend and descend 4 stairs with single rail UE support using a step-through pattern with SBA in order to demonstrate improved balance, safety, and decrease risk of falls.    Baseline  05/09/18=CGA/Min A for safety, step-to pattern, LUE support, 06/13/18= LUE support and step to pattern, 07/19/18 needs UE support    Time  4    Period  Weeks    Status  On-going    Target Date  08/29/18            Plan - 08/15/18 0920    Clinical Impression Statement  Patient has incoordination of BLE during ambulation with staggering steps and loss of balance during speed changes, turns, step ups  or navigating through obstacles. He will continue to benefit form skilled PT to improve safety and mobility.    Rehab Potential  Good    PT Frequency  1x / week    PT Duration  12 weeks    PT Treatment/Interventions  Therapeutic activities;Therapeutic exercise;Balance training;Neuromuscular re-education;Functional mobility training;Gait training;Manual techniques    PT Next Visit Plan  progress balance and HEP    PT Home Exercise Plan  heel raises, bridges    Consulted and Agree with Plan of Care  Patient       Patient will benefit from skilled therapeutic intervention in order to improve the following deficits and impairments:  Abnormal gait, Decreased coordination, Decreased activity tolerance, Decreased endurance, Decreased strength, Decreased balance, Decreased safety awareness, Difficulty walking  Visit Diagnosis: Muscle weakness (generalized)  Neurologic neglect syndrome  Other lack of coordination  Unsteadiness on feet  Difficulty in walking, not elsewhere classified     Problem List Patient Active Problem List   Diagnosis Date Noted  . Sleep disturbance   . PAF (paroxysmal atrial fibrillation) ([place])   . Recurrent UTI   . Hypokalemia   . Hyponatremia   . Abnormal urine   . Aphasia due to  closed TBI (traumatic brain injury)   . Benign essential HTN   . Urinary retention   . History of gout   . Global aphasia   . SAH (subarachnoid hemorrhage) ([place])   . SDH (subdural hematoma) ([place])   . Lymphocytosis   . Subdural hemorrhage following injury ([place]) 05/12/2016    Alanson Puls, PT DPT 08/15/2018, 9:24 AM  [place] MAIN [facility] SERVICES [address] [place], [place], [address] Phone: [phone]   Fax:  [phone]  Name: Tycho Cheramie MRN: [id number] Date of Birth: September 03, 1940

## 2018-08-22 ENCOUNTER — Ambulatory Visit: Payer: PPO | Admitting: Occupational Therapy

## 2018-08-22 ENCOUNTER — Ambulatory Visit: Payer: PPO | Admitting: Physical Therapy

## 2018-08-22 ENCOUNTER — Encounter: Payer: Self-pay | Admitting: Physical Therapy

## 2018-08-22 DIAGNOSIS — R278 Other lack of coordination: Secondary | ICD-10-CM

## 2018-08-22 DIAGNOSIS — M6281 Muscle weakness (generalized): Secondary | ICD-10-CM

## 2018-08-22 DIAGNOSIS — R262 Difficulty in walking, not elsewhere classified: Secondary | ICD-10-CM

## 2018-08-22 DIAGNOSIS — R2681 Unsteadiness on feet: Secondary | ICD-10-CM

## 2018-08-22 DIAGNOSIS — R414 Neurologic neglect syndrome: Secondary | ICD-10-CM

## 2018-08-22 NOTE — Therapy (Signed)
[place] MAIN [facility] SERVICES [address] [place], [place], [address] Phone: [phone]   Fax:  [phone]  Occupational Therapy Treatment/Progress Update/10th visit Reporting period from 06/04/2018 to 08/15/2018   Patient Details  Name: James [name] MRN: [id number] Date of Birth: 1940/12/02 No data recorded  Encounter Date: 08/15/2018  OT End of Session - 08/22/18 1513    Visit Number  14    Number of Visits  37    Date for OT Re-Evaluation  08/29/18    Authorization Type  Medicare visit 10/10    OT Start Time  1000    OT Stop Time  1045    OT Time Calculation (min)  45 min    Activity Tolerance  Patient tolerated treatment well    Behavior During Therapy  [facility] for tasks assessed/performed       Past Medical History:  Diagnosis Date  . Alcohol abuse, in remission   . Atrial fibrillation ([place])   . Bilateral renal masses   . Closed right ankle fracture   . 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 Gait disorder   . Gout   . Hypertension   . Seizure disorder ([place])   . 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     Past Surgical History:  Procedure Laterality Date  . CRANIECTOMY FOR DEPRESSED SKULL FRACTURE  2009   with MRSA infection  . CRANIOTOMY     X 2  . HERNIA REPAIR     during childhood  . ORIF ANKLE FRACTURE Left 2012    There were no vitals filed for this visit.  Subjective Assessment - 08/22/18 1512    Subjective   Patient indicating he would like to work on his right hand.  Wife having surgery soon for knee replacement.     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  He suffered another fall in 2017.  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     Patient Stated Goals  to be able to use the right hand functionally     Currently in Pain?  No/denies    Multiple Pain Sites  No         Therex: Patient seen for manipulation of Resistive pinch pins on the right to pick up, pinch and place in elevated plane motion encouraging shoulder flexion.  Cues for lateral pinch on more resistive pins such as the blue and the black.  Manipulation of push pins into bulletin board, moderate resistance required to place pins fully into the board.  Patient requires verbal cues as well as occasional tactile cues for prehension patterns for picking up and holding item with right hand.     Neuro: Manipulation of Glass beads from flat surface, some with flat side down, others with flat side up.  Patient picking up and placing into container with cues for prehension patterns, attempts at translatory movements of the hand to move objects from finger tip to palm and back.  Patient has moderate difficulty with this task and often drops items from the ulnar side of the hand.                    OT Education - 08/22/18 1512    Education provided  Yes  Education Details  fine motor coordination skills with picking up small objects    Person(s) Educated  Patient;Spouse    Methods  Explanation;Demonstration;Verbal cues    Comprehension  Verbalized understanding;Returned demonstration;Verbal cues required          OT Long Term Goals - 08/10/18 1753      OT LONG TERM GOAL #1   Title  Patient will demonstrate use of utensil in right hand for self feeding with modified independence with little to no spillage.     Baseline  still has diff with spillage and managing utensils at times.    Time  12    Period  Weeks    Status  On-going      OT LONG TERM GOAL #2   Title  Pt will increase hand strength in right by 5# to be able to cut food with modified independence.    Baseline  unable at eval, able to cut meat with occasional assist at 30 visit.    Time  12    Period  Weeks    Status  Achieved      OT LONG TERM  GOAL #3   Title  Patient will demonstrate increased awareness and strategies to attend to right side to hold items without dropping items.    Baseline  drops items frequently, 30 visit-patient dropping objects less frequently and is more aware when holding objects in hand    Time  12    Period  Weeks    Status  On-going      OT LONG TERM GOAL #4   Title  Patient will demonstrate independence in home exercise program for ROM, strength and coordination of right hand.    Baseline  no current program, modified independent with current program at 30 visit but continuing to add exercises.    Time  6    Period  Weeks    Status  On-going      OT LONG TERM GOAL #5   Title  Patient will demonstrate the ability to obtain items from right pocket 90% of the time.    Baseline  difficulty getting keys or coins out of right pocket, 30 visit update-able to demonstrate 75% of the time    Time  12    Period  Weeks    Status  Achieved      OT LONG TERM GOAL #6   Title  Patient will demonstrate pouring a drink with the right hand with attention to right side and not knocking the cup over and spilling.      Baseline  patient often does not realize the cup is on his right side and knocks it over when attempting to get a drink    Time  12    Period  Weeks    Status  On-going            Plan - 08/22/18 1514    Clinical Impression Statement  Patient continues to work towards improving right hand function with graded pressure and coordination skills.  Although patient demonstrates lack of sensation in the right, he is able to compensate with use of vision and attention to right hand.  He continues to demonstrate balance issues and often loses his balance as he performs transtional movements.  Continue to work towards goals in [place] to maximize safety and independence in daily tasks.     Occupational Profile and client history currently impacting functional performance  repeated falls, TBI, expressive  aphasia, limited  motion in right hand, lack of coordination from previous CVA    Occupational performance deficits (Please refer to evaluation for details):  ADL's;IADL's;Leisure    Rehab Potential  Good    Current Impairments/barriers affecting progress:  expressive aphasia, falls, neglect    OT Frequency  1x / week    OT Duration  12 weeks    OT Treatment/Interventions  Self-care/ADL training;Visual/perceptual remediation/compensation;DME and/or AE instruction;Patient/family education;Moist Heat;Therapeutic exercise;Manual Therapy;Therapeutic activities;Neuromuscular education    Consulted and Agree with Plan of Care  Patient    Family Member Consulted  wife, [name]       Patient will benefit from skilled therapeutic intervention in order to improve the following deficits and impairments:  Impaired vision/preception, Decreased coordination, Decreased safety awareness, Impaired UE functional use, Decreased strength  Visit Diagnosis: Muscle weakness (generalized)  Neurologic neglect syndrome  Other lack of coordination    Problem List Patient Active Problem List   Diagnosis Date Noted  . Sleep disturbance   . PAF (paroxysmal atrial fibrillation) ([place])   . Recurrent UTI   . Hypokalemia   . Hyponatremia   . Abnormal urine   . 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 Benign essential HTN   . Urinary retention   . History of gout   . Global aphasia   . SAH (subarachnoid hemorrhage) ([place])   . SDH (subdural hematoma) ([place])   . Lymphocytosis   . Subdural hemorrhage following injury [facility]) 05/12/2016    T , OTR/L, CLT  , 08/22/2018, 3:23 PM  [place] MAIN [facility] SERVICES [address] [place], [place], [address] Phone: [phone]   Fax:  [phone]  Name: Kourtland Coopman MRN: [id number] Date of Birth: November 20, 1940

## 2018-08-22 NOTE — Therapy (Signed)
[name] [facility] SERVICES [address] [place], [place], [address] Phone: [phone]   Fax:  [phone]  Physical Therapy Treatment  Patient Details  Name: James [name] MRN: [id number] Date of Birth: Jun 23, 1941 Referring Provider (PT): shah   Encounter Date: 08/22/2018  PT End of Session - 08/22/18 0920    Visit Number  38    Number of Visits  49    Date for PT Re-Evaluation  08/29/18    Authorization Type  8/10 starting on 06/13/18 progress note    PT Start Time  0915    PT Stop Time  1000    PT Time Calculation (min)  45 min    Equipment Utilized During Treatment  Gait belt    Activity Tolerance  Patient tolerated treatment well    Behavior During Therapy  WFL for tasks assessed/performed       Past Medical History:  Diagnosis Date  . Alcohol abuse, in remission   . Atrial fibrillation ([place])   . Bilateral renal masses   . Closed right ankle fracture   . 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 Gait disorder   . Gout   . Hypertension   . Seizure disorder ([place])   . 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     Past Surgical History:  Procedure Laterality Date  . CRANIECTOMY FOR DEPRESSED SKULL FRACTURE  2009   with MRSA infection  . CRANIOTOMY     X 2  . HERNIA REPAIR     during childhood  . ORIF ANKLE FRACTURE Left 2012    There were no vitals filed for this visit.  Subjective Assessment - 08/22/18 0920    Subjective  Patient denies any falls or pain since last session. 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  He suffered another fall in 2017.  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    Patient Stated Goals  Patient wants to have a safer gait  and not stagger or feel off balance.     Currently in Pain?  No/denies    Multiple Pain Sites  No         Neuromuscular Re-education  Side stepping on TM at . 3 miles/ hr and elevation 1 x 3 mins left and 3 mins right  Airex balance with toe taps to 6" step alternating LE x 10 each, faded UE support, continues to need UE support 75%  Static balance on foam one LE and 6 inch stool 1 LE , head turns with no UE support 30s x 2  Rockerboard:Lateral weight shift x10 reps each direction, no UE support, CGA for safety with VCs to control the board tap in each direction and not letting it hit too hard  Feet apart on BOSU ball and head turnsx10 reps, UE support, CGA for safety with VCs to utilizecore  Airex padHead turnsx2 min, CGA for safety, demonstrated difficulty with catching the ball on its return and required VCs to control the speed of toss to control the return speed  Four square fwd/bwd x 10, side to side x 10 with CGA and several loss of balance   Therapeutic exercise:  SLR with 3 lbs x 15 x 2   Hip abd with 3 lbs x 15 x 2  hooklying abduction/ER with GTB x 15 x 2   hooklying marching x 20   Bridging x 15 x 2   Patient needs vC and TC  for correct technique and form. Pt demonstrates increased postural sway when standing on uneven surface and requires // bars to steady, and demonstrates fatigue at end of set of exercises focused on strength and endurance                     PT Education - 08/22/18 0920    Education Details  HEP    Person(s) Educated  Patient    Methods  Explanation;Demonstration;Tactile cues;Verbal cues    Comprehension  Returned demonstration;Need further instruction       PT Short Term Goals - 06/13/18 0912      PT SHORT TERM GOAL #1   Title  Patient will be independent in home exercise program to improve strength/mobility for better functional independence with ADLs.    Baseline  05/09/2018: Pt reports doing  bed exercises, 06/13/18  Pt reports doing bed exercises    Time  6    Period  Weeks    Status  Partially Met    Target Date  08/29/18      PT SHORT TERM GOAL #2   Title  Patient (> 49 years old) will complete five times sit to stand test in < 15 seconds indicating an increased LE strength and improved balance.    Baseline  19.15 sec and needs CGA to min assist to keep from falling, 03/29/18=16.82 sec, 05/09/2018 15.23sec11/20/19=13.73    Time  6    Period  Weeks    Status  Partially Met    Target Date  08/29/18        PT Long Term Goals - 07/19/18 1003      PT LONG TERM GOAL #1   Title  Patient will increase Berg Balance score by > 6 points to demonstrate decreased fall risk during functional activities.    Baseline  36/56 Berg,;  03/29/18 Berg = 36/52; 05/09/18= 45/56, 06/13/18=43/56, 07/19/18= 44/56    Time  12    Period  Weeks    Status  On-going    Target Date  08/29/18      PT LONG TERM GOAL #2   Title  Patient will reduce timed up and go to <11 seconds to reduce fall risk and demonstrate improved transfer/gait ability.    Baseline  15.18 sec with poor safety judgement and uncontrolled sit, 03/29/18=13.02 sec, 05/09/2018 10.53 sec, 06/13/18=9.48 sec, 07/19/18  10.52    Time  12    Period  Weeks    Status  On-going    Target Date  08/29/18      PT LONG TERM GOAL #3   Title  Patient will increase dynamic gait index score to >19/24 as to demonstrate reduced fall risk and improved dynamic gait balance for better safety with community/home ambulation.     Baseline  16/24, 03/29/18=16/25; 05/09/18=17/24, 06/13/18=17/24, 07/19/18 = 17/24    Time  12    Period  Weeks    Status  On-going    Target Date  08/29/18      PT LONG TERM GOAL #4   Title  Patient will ascend and descend 4 stairs with single rail UE support using a step-through pattern with SBA in order to demonstrate improved  balance, safety, and decrease risk of falls.    Baseline  05/09/18=CGA/Min A for safety, step-to  pattern, LUE support, 06/13/18= LUE support and step to pattern, 07/19/18 needs UE support    Time  4    Period  Weeks    Status  On-going    Target Date  08/29/18            Plan - 08/22/18 5597    Clinical Impression Statement  Pt requires direction and verbal cues for correct performance of exercises. Patient demonstrates improving strength in BLE and performs open and closed chain exercises and reports no of pain. Pt was able to perform all exercises with min VC for technique. Patient struggles with keeping a straight path during ambulation especially during turning and doing dual tasks. Pt encouraged continuing HEP. Follow-up as scheduled.    Rehab Potential  Good    PT Frequency  1x / week    PT Duration  12 weeks    PT Treatment/Interventions  Therapeutic activities;Therapeutic exercise;Balance training;Neuromuscular re-education;Functional mobility training;Gait training;Manual techniques    PT Next Visit Plan  progress balance and HEP    PT Home Exercise Plan  heel raises, bridges    Consulted and Agree with Plan of Care  Patient       Patient will benefit from skilled therapeutic intervention in order to improve the following deficits and impairments:  Abnormal gait, Decreased coordination, Decreased activity tolerance, Decreased endurance, Decreased strength, Decreased balance, Decreased safety awareness, Difficulty walking  Visit Diagnosis: Muscle weakness (generalized)  Neurologic neglect syndrome  Other lack of coordination  Unsteadiness on feet  Difficulty in walking, not elsewhere classified     Problem List Patient Active Problem List   Diagnosis Date Noted  . Sleep disturbance   . PAF (paroxysmal atrial fibrillation) ([place])   . Recurrent UTI   . Hypokalemia   . Hyponatremia   . Abnormal urine   . 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 Benign essential HTN   . Urinary retention   . History of gout   . Global aphasia   . SAH  (subarachnoid hemorrhage) ([place])   . SDH (subdural hematoma) ([place])   . Lymphocytosis   . Subdural hemorrhage following injury ([place]) 05/12/2016    Alanson Puls, PT DPT 08/22/2018, 9:24 AM  [place] MAIN [facility] SERVICES [address] [place], [place], [address] Phone: [phone]   Fax:  [phone]  Name: James [name] MRN: [id number] Date of Birth: [birth year]

## 2018-08-24 ENCOUNTER — Encounter: Payer: Self-pay | Admitting: Occupational Therapy

## 2018-08-24 NOTE — Therapy (Signed)
[name] MAIN [facility] SERVICES [address] [place], [place], [address] Phone: [phone]   Fax:  [phone]  Occupational Therapy Treatment  Patient Details  Name: James [name] MRN: [id number] Date of Birth: Oct 12, 1940 No data recorded  Encounter Date: 08/22/2018  OT End of Session - 08/24/18 1448    Visit Number  39    Number of Visits  26    Date for OT Re-Evaluation  08/29/18    Authorization Type  Medicare visit 1/10    OT Start Time  1015    OT Stop Time  1100    OT Time Calculation (min)  45 min    Activity Tolerance  Patient tolerated treatment well    Behavior During Therapy  [facility] for tasks assessed/performed       Past Medical History:  Diagnosis Date  . Alcohol abuse, in remission   . Atrial fibrillation ([place])   . Bilateral renal masses   . Closed right ankle fracture   . 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 Gait disorder   . Gout   . Hypertension   . Seizure disorder ([place])   . 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     Past Surgical History:  Procedure Laterality Date  . CRANIECTOMY FOR DEPRESSED SKULL FRACTURE  2009   with MRSA infection  . CRANIOTOMY     X 2  . HERNIA REPAIR     during childhood  . ORIF ANKLE FRACTURE Left 2012    There were no vitals filed for this visit.  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  He suffered another fall in 2017.  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     Patient Stated Goals  to be able to use the right hand functionally    Currently in Pain?  No/denies    Multiple Pain Sites  No          Neuromuscular Reeducation: Patient seen this date with focus on manipulation skills with picking up Coins from flat tabletop  and placing into bank, patient has difficulty at times picking up coins from flat surface and has to bring item to the edge of the table in order to pick up.  Cues for using the hand for storage with attempts to move coins to palm and back, drops items occasionally.  He also occasionally forgets he has a coin in his hand before picking up the next set of coins, requires cues to attend to right hand.    Patient seen for coordination tasks including manipulation of [place] discs, cues for picking up with middle finger and thumb while attempting to use index finger to turn and flip to opposite side of the disc.  Then he worked on [employer] with right hand up to 12 without knocking over, working more on precision movements.  Patient seen for manipulation of pieces from [employer] pegboard, difficulty with collars mostly and removing with using the palm for storage, cues provided for technique.   IADLs: Patient seen for focus on Handwriting skills with right hand with printing and signing name, tried several pens and does best with pen with continuously ink flow.  Cues for keeping in the designated lines and  cues occasionally to readjust tripod grasp.  Strengthening: Patient seen for strengthening with Resistive pinch pins, placing onto elevated surface to encourage reaching patterns.  Patient able to complete all levels of pinch but demos more difficulty with most resistive black pin.                   OT Education - 08/24/18 1457    Education provided  Yes    Education Details  tripod grasp, handwriting    Person(s) Educated  Patient;Spouse    Methods  Explanation;Demonstration;Verbal cues    Comprehension  Verbalized understanding;Returned demonstration;Verbal cues required          OT Long Term Goals - 08/10/18 1753      OT LONG TERM GOAL #1   Title  Patient will demonstrate use of utensil in right hand for self feeding with modified independence with little to no spillage.      Baseline  still has diff with spillage and managing utensils at times.    Time  12    Period  Weeks    Status  On-going      OT LONG TERM GOAL #2   Title  Pt will increase hand strength in right by 5# to be able to cut food with modified independence.    Baseline  unable at eval, able to cut meat with occasional assist at 30 visit.    Time  12    Period  Weeks    Status  Achieved      OT LONG TERM GOAL #3   Title  Patient will demonstrate increased awareness and strategies to attend to right side to hold items without dropping items.    Baseline  drops items frequently, 30 visit-patient dropping objects less frequently and is more aware when holding objects in hand    Time  12    Period  Weeks    Status  On-going      OT LONG TERM GOAL #4   Title  Patient will demonstrate independence in home exercise program for ROM, strength and coordination of right hand.    Baseline  no current program, modified independent with current program at 30 visit but continuing to add exercises.    Time  6    Period  Weeks    Status  On-going      OT LONG TERM GOAL #5   Title  Patient will demonstrate the ability to obtain items from right pocket 90% of the time.    Baseline  difficulty getting keys or coins out of right pocket, 30 visit update-able to demonstrate 75% of the time    Time  12    Period  Weeks    Status  Achieved      OT LONG TERM GOAL #6   Title  Patient will demonstrate pouring a drink with the right hand with attention to right side and not knocking the cup over and spilling.      Baseline  patient often does not realize the cup is on his right side and knocks it over when attempting to get a drink    Time  12    Period  Weeks    Status  On-going            Plan - 08/24/18 1449    Clinical Impression Statement  Patient continues to progress towards improving right hand function.  He is becoming more consistent with appropriate level grasp on items as well as  a more  secure tripod grasp on a pen.  Continue to work towards goals to improve safety and independence with necessary daily tasks.     Occupational Profile and client history currently impacting functional performance  repeated falls, TBI, expressive aphasia, limited motion in right hand, lack of coordination from previous CVA    Occupational performance deficits (Please refer to evaluation for details):  ADL's;IADL's;Leisure    Rehab Potential  Good    Current Impairments/barriers affecting progress:  expressive aphasia, falls, neglect    OT Frequency  1x / week    OT Duration  12 weeks    OT Treatment/Interventions  Self-care/ADL training;Visual/perceptual remediation/compensation;DME and/or AE instruction;Patient/family education;Moist Heat;Therapeutic exercise;Manual Therapy;Therapeutic activities;Neuromuscular education    Consulted and Agree with Plan of Care  Patient       Patient will benefit from skilled therapeutic intervention in order to improve the following deficits and impairments:  Impaired vision/preception, Decreased coordination, Decreased safety awareness, Impaired UE functional use, Decreased strength  Visit Diagnosis: Muscle weakness (generalized)  Other lack of coordination  Neurologic neglect syndrome    Problem List Patient Active Problem List   Diagnosis Date Noted  . Sleep disturbance   . PAF (paroxysmal atrial fibrillation) ([place])   . Recurrent UTI   . Hypokalemia   . Hyponatremia   . Abnormal urine   . 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 Benign essential HTN   . Urinary retention   . History of gout   . Global aphasia   . SAH (subarachnoid hemorrhage) ([place])   . SDH (subdural hematoma) ([place])   . Lymphocytosis   . Subdural hemorrhage following injury ([place]) 05/12/2016   Lizette Pazos T Maiko Salais, OTR/L, CLT  Cabrina Shiroma 08/24/2018, 2:58 PM  [place] MAIN [facility] SERVICES [address] [place], [place],  [address] Phone: [phone]   Fax:  [phone]  Name: James [name] MRN: [id number] Date of Birth: 05/21/1941

## 2018-08-28 ENCOUNTER — Encounter: Payer: Self-pay | Admitting: Occupational Therapy

## 2018-08-28 ENCOUNTER — Ambulatory Visit: Payer: PPO | Attending: Neurology | Admitting: Physical Therapy

## 2018-08-28 ENCOUNTER — Ambulatory Visit: Payer: PPO | Admitting: Occupational Therapy

## 2018-08-28 DIAGNOSIS — R262 Difficulty in walking, not elsewhere classified: Secondary | ICD-10-CM | POA: Diagnosis not present

## 2018-08-28 DIAGNOSIS — R278 Other lack of coordination: Secondary | ICD-10-CM | POA: Diagnosis not present

## 2018-08-28 DIAGNOSIS — R414 Neurologic neglect syndrome: Secondary | ICD-10-CM | POA: Diagnosis not present

## 2018-08-28 DIAGNOSIS — M6281 Muscle weakness (generalized): Secondary | ICD-10-CM | POA: Diagnosis not present

## 2018-08-28 DIAGNOSIS — R2681 Unsteadiness on feet: Secondary | ICD-10-CM

## 2018-08-28 NOTE — Therapy (Signed)
[place] MAIN [facility] SERVICES [address] [place], [place], [address] Phone: [phone]   Fax:  [phone]  Physical Therapy Treatment  Patient Details  Name: James [name] MRN: [id number] Date of Birth: 1941-02-23 Referring Provider (PT): shah   Encounter Date: 08/28/2018  PT End of Session - 08/28/18 0841    Visit Number  39    Number of Visits  19    Date for PT Re-Evaluation  08/29/18    Authorization Type  9/10 starting on 06/13/18 progress note    PT Start Time  0830    PT Stop Time  0915    PT Time Calculation (min)  45 min    Equipment Utilized During Treatment  Gait belt    Activity Tolerance  Patient tolerated treatment well    Behavior During Therapy  WFL for tasks assessed/performed       Past Medical History:  Diagnosis Date  . Alcohol abuse, in remission   . Atrial fibrillation ([place])   . Bilateral renal masses   . Closed right ankle fracture   . 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 Gait disorder   . Gout   . Hypertension   . Seizure disorder ([place])   . 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     Past Surgical History:  Procedure Laterality Date  . CRANIECTOMY FOR DEPRESSED SKULL FRACTURE  2009   with MRSA infection  . CRANIOTOMY     X 2  . HERNIA REPAIR     during childhood  . ORIF ANKLE FRACTURE Left 2012    There were no vitals filed for this visit.  Subjective Assessment - 08/28/18 0840    Subjective  Patient denies any falls or pain since last session. 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  He suffered another fall in 2017.  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    Patient Stated Goals  Patient wants to have a safer gait  and not stagger or feel off balance.     Currently in Pain?  No/denies    Multiple Pain Sites  No       Review HEP: Heel raises x20 reps bilaterally, BUE support in // bars, supervision for safety Marching x15 reps each leg, BUE support in // bars, supervision for safety Feet together  head turns x 2 mins HHA on bars BUE supervision for safety, UE support  VCs for proper technique and positioning for each exercise  Rockerboard Lateral weight shift x10 reps each direction, 50% UE support, CGA for safety with VCs to control the board tap in each direction and not letting it hit too hard  AP weight shift x10 reps, no UE support, CGA for safety with VCs to utilize ankles to transfer weight over toes and back over heels without just leaning the trunk  Four square: Step out, out, in, in forwards x2 laps, backwards x2 laps, CGA for safety, VCs to take big enough steps and to try to increase speed to work on coordination  Side stepping in parallel bars,  CGA for safety, VCs for taking a big enough step   TM walking with BUE support  1. 0 miles / hour  Supine hooklying hip abd/ER x 20  SLR x 10 x 2 Hip abd in sidelying x 10 x 2 ; poor motor control and assist needed for correct positioning of leg during exercises Bridging x 10 x 2, cues for correct LE positioning   CGA and  mod verbal cues used throughout with increased in postural sway and LOB most seen with narrow base of support and while on uneven surfaces. Continues to have balance deficits typical with diagnosis. Patient performs intermediate level exercises without pain behaviors and needs verbal cuing for postural alignment and head positioning                         PT Education - 08/28/18 0840    Education Details  HEP    Person(s) Educated  Patient    Methods  Explanation    Comprehension  Returned demonstration;Need further instruction       PT Short Term Goals - 06/13/18 0912      PT SHORT TERM GOAL  #1   Title  Patient will be independent in home exercise program to improve strength/mobility for better functional independence with ADLs.    Baseline  05/09/2018: Pt reports doing bed exercises, 06/13/18  Pt reports doing bed exercises    Time  6    Period  Weeks    Status  Partially Met    Target Date  08/29/18      PT SHORT TERM GOAL #2   Title  Patient (> 6 years old) will complete five times sit to stand test in < 15 seconds indicating an increased LE strength and improved balance.    Baseline  19.15 sec and needs CGA to min assist to keep from falling, 03/29/18=16.82 sec, 05/09/2018 15.23sec11/20/19=13.73    Time  6    Period  Weeks    Status  Partially Met    Target Date  08/29/18        PT Long Term Goals - 07/19/18 1003      PT LONG TERM GOAL #1   Title  Patient will increase Berg Balance score by > 6 points to demonstrate decreased fall risk during functional activities.    Baseline  36/56 Berg,;  03/29/18 Berg = 36/52; 05/09/18= 45/56, 06/13/18=43/56, 07/19/18= 44/56    Time  12    Period  Weeks    Status  On-going    Target Date  08/29/18      PT LONG TERM GOAL #2   Title  Patient will reduce timed up and go to <11 seconds to reduce fall risk and demonstrate improved transfer/gait ability.    Baseline  15.18 sec with poor safety judgement and uncontrolled sit, 03/29/18=13.02 sec, 05/09/2018 10.53 sec, 06/13/18=9.48 sec, 07/19/18  10.52    Time  12    Period  Weeks    Status  On-going    Target Date  08/29/18      PT LONG TERM GOAL #3   Title  Patient will increase dynamic gait index score to >19/24 as to demonstrate reduced fall risk and improved dynamic gait balance for better safety with community/home ambulation.     Baseline  16/24, 03/29/18=16/25; 05/09/18=17/24, 06/13/18=17/24, 07/19/18 = 17/24    Time  12    Period  Weeks    Status  On-going    Target Date  08/29/18      PT LONG TERM GOAL #4   Title  Patient will ascend and descend 4 stairs with single rail  UE support using a step-through pattern with SBA in order to demonstrate improved balance, safety, and decrease risk of falls.    Baseline  05/09/18=CGA/Min A for safety, step-to pattern, LUE support, 06/13/18= LUE support and step to pattern, 07/19/18 needs UE support    Time  4    Period  Weeks    Status  On-going    Target Date  08/29/18            Plan - 08/28/18 0843    Clinical Impression Statement  Pt requires direction and verbal cues for correct performance of exercises. Patient demonstrates decreased strength BLE and incoordination and poor motor control in BLE and core strength and performs open and closed chain exercises and reports no of pain. Pt was able to perform all exercises with min VC for technique. Pt encouraged continuing HEP.    Rehab Potential  Good    PT Frequency  1x / week    PT Duration  12 weeks    PT Treatment/Interventions  Therapeutic activities;Therapeutic exercise;Balance training;Neuromuscular re-education;Functional mobility training;Gait training;Manual techniques    PT Next Visit Plan  progress balance and HEP    PT Home Exercise Plan  heel raises, bridges    Consulted and Agree with Plan of Care  Patient       Patient will benefit from skilled therapeutic intervention in order to improve the following deficits and impairments:  Abnormal gait, Decreased coordination, Decreased activity tolerance, Decreased endurance, Decreased strength, Decreased balance, Decreased safety awareness, Difficulty walking  Visit Diagnosis: Muscle weakness (generalized)  Other lack of coordination  Neurologic neglect syndrome  Unsteadiness on feet  Difficulty in walking, not elsewhere classified     Problem List Patient Active Problem List   Diagnosis Date Noted  . Sleep disturbance   . PAF (paroxysmal atrial fibrillation) ([place])   . Recurrent UTI   . Hypokalemia   . Hyponatremia   . Abnormal urine   . Aphasia due to closed TBI (traumatic brain  injury)   . Benign essential HTN   . Urinary retention   . History of gout   . Global aphasia   . SAH (subarachnoid hemorrhage) ([place])   . SDH (subdural hematoma) ([place])   . Lymphocytosis   . Subdural hemorrhage following injury ([place]) 05/12/2016    Alanson Puls, PT DPT 08/28/2018, 8:46 AM  [place] MAIN [facility] SERVICES [address] [place], [place], [address] Phone: [phone]   Fax:  [phone]  Name: James [name] MRN: [id number] Date of Birth: [birth year]

## 2018-09-03 ENCOUNTER — Encounter: Payer: Self-pay | Admitting: Occupational Therapy

## 2018-09-03 NOTE — Therapy (Signed)
[place] MAIN [facility] SERVICES [address] [place], [place], [address] Phone: [phone]   Fax:  [phone]  Occupational Therapy Treatment  Patient Details  Name: James [name] MRN: [id number] Date of Birth: December 23, 1940 No data recorded  Encounter Date: 08/28/2018  OT End of Session - 09/03/18 1554    Visit Number  82    Number of Visits  68    Date for OT Re-Evaluation  08/29/18    OT Start Time  0930    OT Stop Time  1015    OT Time Calculation (min)  45 min    Activity Tolerance  Patient tolerated treatment well    Behavior During Therapy  [facility] for tasks assessed/performed       Past Medical History:  Diagnosis Date  . Alcohol abuse, in remission   . Atrial fibrillation ([place])   . Bilateral renal masses   . Closed right ankle fracture   . 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 Gait disorder   . Gout   . Hypertension   . Seizure disorder ([place])   . 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     Past Surgical History:  Procedure Laterality Date  . CRANIECTOMY FOR DEPRESSED SKULL FRACTURE  2009   with MRSA infection  . CRANIOTOMY     X 2  . HERNIA REPAIR     during childhood  . ORIF ANKLE FRACTURE Left 2012    There were no vitals filed for this visit.  Subjective Assessment - 09/03/18 1553    Subjective   Patient indicating he is doing well, his wife is having surgery next week on her knee.  He will still plan to come for therapy, SIL will bring him.     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  He suffered another fall in 2017.  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     Patient Stated Goals  to be able to use the right hand functionally    Currently in Pain?  No/denies    Multiple Pain Sites  No          Neuromuscular Reeducation:   Patient seen for manipulation of Small dowel sticks to pick up and place into grid, unable to perform oppositional movements except index and thumb, unable to pick up or remove with thumb/middle finger despite verbal and tactile cues. Minimal difficulty with picking up from flat table and turning to place into grid.  Cues for prehension patterns to pick up and with manipulation skills.  Patient seen for manipulation of Nuts and bolts on elevated surface cues for thumb finger combinations, tends to want to perform with just index finger. Once cued, he is able to correct pattern and complete task with occasional cues.   Self care:  Patient seen for focus on handwriting for completing checks, writing on specific lines and smaller spaces than in the past.  Legibility remains fair to poor, medium built up grip pen utlilized.  Performs best with fluid ink versus ballpoint pen.                     OT Education - 09/03/18 1554    Education provided  Yes    Education Details  thumb and  finger combinations, oppositional movements    Person(s) Educated  Patient;Spouse    Methods  Explanation;Demonstration;Verbal cues    Comprehension  Verbalized understanding;Returned demonstration;Verbal cues required          OT Long Term Goals - 08/10/18 1753      OT LONG TERM GOAL #1   Title  Patient will demonstrate use of utensil in right hand for self feeding with modified independence with little to no spillage.     Baseline  still has diff with spillage and managing utensils at times.    Time  12    Period  Weeks    Status  On-going      OT LONG TERM GOAL #2   Title  Pt will increase hand strength in right by 5# to be able to cut food with modified independence.    Baseline  unable at eval, able to cut meat with occasional assist at 30 visit.    Time  12    Period  Weeks    Status  Achieved      OT LONG TERM GOAL #3   Title  Patient will demonstrate  increased awareness and strategies to attend to right side to hold items without dropping items.    Baseline  drops items frequently, 30 visit-patient dropping objects less frequently and is more aware when holding objects in hand    Time  12    Period  Weeks    Status  On-going      OT LONG TERM GOAL #4   Title  Patient will demonstrate independence in home exercise program for ROM, strength and coordination of right hand.    Baseline  no current program, modified independent with current program at 30 visit but continuing to add exercises.    Time  6    Period  Weeks    Status  On-going      OT LONG TERM GOAL #5   Title  Patient will demonstrate the ability to obtain items from right pocket 90% of the time.    Baseline  difficulty getting keys or coins out of right pocket, 30 visit update-able to demonstrate 75% of the time    Time  12    Period  Weeks    Status  Achieved      OT LONG TERM GOAL #6   Title  Patient will demonstrate pouring a drink with the right hand with attention to right side and not knocking the cup over and spilling.      Baseline  patient often does not realize the cup is on his right side and knocks it over when attempting to get a drink    Time  12    Period  Weeks    Status  On-going            Plan - 09/03/18 1555    Clinical Impression Statement  Patient continues to progress with right hand function, continues to demo difficulty with manipulation skills with thumb finger combinations and with oppositional movements.  Graded pressure has improved but still has difficulty at times if he is distracted and focusing intensely on a task.  Continue to work towards goals to improve hand function for self care and IADL tasks.  Reassessment next session.     Occupational Profile and client history currently impacting functional performance  repeated falls, TBI, expressive aphasia, limited motion in right hand, lack of coordination from previous CVA     Occupational performance deficits (  Please refer to evaluation for details):  ADL's;IADL's;Leisure    Rehab Potential  Good    Current Impairments/barriers affecting progress:  expressive aphasia, falls, neglect    OT Frequency  1x / week    OT Duration  12 weeks    OT Treatment/Interventions  Self-care/ADL training;Visual/perceptual remediation/compensation;DME and/or AE instruction;Patient/family education;Moist Heat;Therapeutic exercise;Manual Therapy;Therapeutic activities;Neuromuscular education    Consulted and Agree with Plan of Care  Patient    Family Member Consulted  wife, [name]       Patient will benefit from skilled therapeutic intervention in order to improve the following deficits and impairments:  Impaired vision/preception, Decreased coordination, Decreased safety awareness, Impaired UE functional use, Decreased strength  Visit Diagnosis: Muscle weakness (generalized)  Other lack of coordination  Neurologic neglect syndrome    Problem List Patient Active Problem List   Diagnosis Date Noted  . Sleep disturbance   . PAF (paroxysmal atrial fibrillation) ([place])   . Recurrent UTI   . Hypokalemia   . Hyponatremia   . Abnormal urine   . 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 Benign essential HTN   . Urinary retention   . History of gout   . Global aphasia   . SAH (subarachnoid hemorrhage) ([place])   . SDH (subdural hematoma) ([place])   . Lymphocytosis   . Subdural hemorrhage following injury [facility]) 05/12/2016  Amy T Lovett, OTR/L, CLT   Lovett,Amy 09/03/2018, 3:56 PM  [place] MAIN [facility] SERVICES [address] [place], [place], [address] Phone: [phone]   Fax:  [phone]  Name: James [name] MRN: [id number] Date of Birth: Jul 18, 1941

## 2018-09-05 ENCOUNTER — Ambulatory Visit: Payer: PPO | Admitting: Occupational Therapy

## 2018-09-05 ENCOUNTER — Ambulatory Visit: Payer: PPO | Admitting: Physical Therapy

## 2018-09-05 ENCOUNTER — Encounter: Payer: Self-pay | Admitting: Physical Therapy

## 2018-09-05 DIAGNOSIS — R414 Neurologic neglect syndrome: Secondary | ICD-10-CM

## 2018-09-05 DIAGNOSIS — R262 Difficulty in walking, not elsewhere classified: Secondary | ICD-10-CM

## 2018-09-05 DIAGNOSIS — M6281 Muscle weakness (generalized): Secondary | ICD-10-CM

## 2018-09-05 DIAGNOSIS — R2681 Unsteadiness on feet: Secondary | ICD-10-CM

## 2018-09-05 DIAGNOSIS — R278 Other lack of coordination: Secondary | ICD-10-CM

## 2018-09-05 NOTE — Addendum Note (Signed)
Addended by: Alanson Puls on: 09/05/2018 10:26 AM   Modules accepted: Orders

## 2018-09-05 NOTE — Therapy (Signed)
[place] MAIN [facility] SERVICES [address] [place], [place], [address] Phone: [phone]   Fax:  [phone]  Physical Therapy Treatment/recertification [facility] note 07/19/18-09/05/18  Patient Details  Name: James [name] MRN: [id number] Date of Birth: February 23, 1941 Referring Provider (PT): shah   Encounter Date: 09/05/2018  PT End of Session - 09/05/18 0928    Visit Number  40    Number of Visits  106    Date for PT Re-Evaluation  10/31/18    Authorization Type  10/10 starting on 06/13/18 progress note    PT Start Time  0931    PT Stop Time  1010    PT Time Calculation (min)  39 min    Equipment Utilized During Treatment  Gait belt    Activity Tolerance  Patient tolerated treatment well    Behavior During Therapy  [facility] for tasks assessed/performed       Past Medical History:  Diagnosis Date  . Alcohol abuse, in remission   . Atrial fibrillation ([place])   . Bilateral renal masses   . Closed right ankle fracture   . 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 Gait disorder   . Gout   . Hypertension   . Seizure disorder ([place])   . 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     Past Surgical History:  Procedure Laterality Date  . CRANIECTOMY FOR DEPRESSED SKULL FRACTURE  2009   with MRSA infection  . CRANIOTOMY     X 2  . HERNIA REPAIR     during childhood  . ORIF ANKLE FRACTURE Left 2012    There were no vitals filed for this visit.  Subjective Assessment - 09/05/18 0927    Subjective  Patient denies any falls or pain since last session. 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  He suffered another fall in 2017.  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    Patient  Stated Goals  Patient wants to have a safer gait and not stagger or feel off balance.        Treatment: Berg balance test performed DGI performed Assessment on steps TUG Goals reviewed Patient has incoordination of movements and has balance impairment, RUE is not able to assist with rollator                        PT Education - 09/05/18 0927    Education Details  HEP    Person(s) Educated  Patient    Methods  Explanation;Demonstration    Comprehension  Returned demonstration       PT Short Term Goals - 06/13/18 0912      PT SHORT TERM GOAL #1   Title  Patient will be independent in home exercise program to improve strength/mobility for better functional independence with ADLs.    Baseline  05/09/2018: Pt reports doing bed exercises, 06/13/18  Pt reports doing bed exercises    Time  6    Period  Weeks    Status  Partially Met    Target Date  08/29/18      PT SHORT TERM GOAL #2   Title  Patient (>  65 years old) will complete five times sit to stand test in < 15 seconds indicating an increased LE strength and improved balance.    Baseline  19.15 sec and needs CGA to min assist to keep from falling, 03/29/18=16.82 sec, 05/09/2018 15.23sec11/20/19=13.73    Time  6    Period  Weeks    Status  Partially Met    Target Date  08/29/18        PT Long Term Goals - 09/05/18 0936      PT LONG TERM GOAL #1   Title  Patient will increase Berg Balance score by > 6 points to demonstrate decreased fall risk during functional activities.    Baseline  36/56 Merrilee Jansky,;  03/29/18 Berg = 36/52; 05/09/18= 45/56, 06/13/18=43/56, 07/19/18= 44/56  :09/05/18=34/56    Time  12    Period  Weeks    Status  On-going    Target Date  10/31/18      PT LONG TERM GOAL #2   Title  Patient will reduce timed up and go to <11 seconds to reduce fall risk and demonstrate improved transfer/gait ability.    Baseline  15.18 sec with poor safety judgement and uncontrolled sit, 03/29/18=13.02 sec,  05/09/2018 10.53 sec, 06/13/18=9.48 sec, 07/19/18  10.52   09/05/18= 14.05 sec    Time  12    Period  Weeks    Status  On-going    Target Date  10/31/18      PT LONG TERM GOAL #3   Title  Patient will increase dynamic gait index score to >19/24 as to demonstrate reduced fall risk and improved dynamic gait balance for better safety with community/home ambulation.     Baseline  16/24, 03/29/18=16/25; 05/09/18=17/24, 06/13/18=17/24, 07/19/18 = 17/24 :   09/05/18=12/24    Time  12    Period  Weeks    Status  On-going    Target Date  10/31/18      PT LONG TERM GOAL #4   Title  Patient will ascend and descend 4 stairs with single rail UE support using a step-through pattern with SBA in order to demonstrate improved balance, safety, and decrease risk of falls.    Baseline  05/09/18=CGA/Min A for safety, step-to pattern, LUE support, 06/13/18= LUE support and step to pattern, 07/19/18 needs UE support  :09/05/18=LUE support and step to pattern,    Time  4    Period  Weeks    Status  On-going    Target Date  10/31/18            Plan - 09/05/18 1010    Clinical Impression Statement  Patient's condition has the potential to improve in response to therapy. Maximum improvement is yet to be obtained. The anticipated improvement is attainable and reasonable in a generally predictable time.  Patient reports that he is doing fine. Patient has deficits in [place]ley balance, and DGI and decreased TUG. He continues to have a falls risk. Pt requires direction and verbal cues for correct performance of exercises. Patient demonstrates decreased strength BLE and incoordination and poor motor control in BLE and core strength and performs open and closed chain exercises and reports no of pain. Pt was able to perform all exercises with mod VC for technique. Pt encouraged continuing HEP    Rehab Potential  Good    PT Frequency  1x / week    PT Duration  12 weeks    PT Treatment/Interventions  Therapeutic  activities;Therapeutic exercise;Balance training;Neuromuscular re-education;Functional mobility  training;Gait training;Manual techniques    PT Next Visit Plan  progress balance and HEP    PT Home Exercise Plan  heel raises, bridges    Consulted and Agree with Plan of Care  Patient       Patient will benefit from skilled therapeutic intervention in order to improve the following deficits and impairments:  Abnormal gait, Decreased coordination, Decreased activity tolerance, Decreased endurance, Decreased strength, Decreased balance, Decreased safety awareness, Difficulty walking  Visit Diagnosis: Muscle weakness (generalized)  Other lack of coordination  Neurologic neglect syndrome  Unsteadiness on feet  Difficulty in walking, not elsewhere classified     Problem List Patient Active Problem List   Diagnosis Date Noted  . Sleep disturbance   . PAF (paroxysmal atrial fibrillation) ([place])   . Recurrent UTI   . Hypokalemia   . Hyponatremia   . Abnormal urine   . 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 Benign essential HTN   . Urinary retention   . History of gout   . Global aphasia   . SAH (subarachnoid hemorrhage) ([place])   . SDH (subdural hematoma) ([place])   . Lymphocytosis   . Subdural hemorrhage following injury ([place]) 05/12/2016    Alanson Puls , PT DPT 09/05/2018, 10:23 AM  [place] MAIN [facility] SERVICES [address] [place], [place], [address] Phone: [phone]   Fax:  [phone]  Name: James [name] MRN: [id number] Date of Birth: 07/25/1941

## 2018-09-08 ENCOUNTER — Encounter: Payer: Self-pay | Admitting: Occupational Therapy

## 2018-09-08 NOTE — Therapy (Signed)
[place] MAIN [facility] SERVICES [address] [place], [place], [address] Phone: [phone]   Fax:  [phone]  Occupational Therapy Treatment/REcertification  Patient Details  Name: James [name] MRN: [id number] Date of Birth: 08-Mar-1941 No data recorded  Encounter Date: 09/05/2018  OT End of Session - 09/08/18 0941    Visit Number  50    Number of Visits  43    Date for OT Re-Evaluation  11/28/18    OT Start Time  1015    OT Stop Time  1100    OT Time Calculation (min)  45 min    Activity Tolerance  Patient tolerated treatment well    Behavior During Therapy  [facility] for tasks assessed/performed       Past Medical History:  Diagnosis Date  . Alcohol abuse, in remission   . Atrial fibrillation ([place])   . Bilateral renal masses   . Closed right ankle fracture   . 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 Gait disorder   . Gout   . Hypertension   . Seizure disorder ([place])   . 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     Past Surgical History:  Procedure Laterality Date  . CRANIECTOMY FOR DEPRESSED SKULL FRACTURE  2009   with MRSA infection  . CRANIOTOMY     X 2  . HERNIA REPAIR     during childhood  . ORIF ANKLE FRACTURE Left 2012    There were no vitals filed for this visit.  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  He suffered another fall in 2017.  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     Patient Stated Goals  to be able to use the right hand functionally    Currently in Pain?  No/denies    Multiple Pain Sites  No            Reassessment of self care tasks, hand function with Goals updated to reflect progress  Therapeutic activity:   Patient was seen this date for more complex task of card making for his wife for Valentine's Day. Pt. was given choices regarding Design options, was able to choose colors in which he preferred and worked towards folding paper with bilateral hand use for making card. Patient instructed on use of loop scissors for cutting paper. Required cues for managing scissors and position of scissors in regards to paper and during cutting exercise. He was unable to demonstrate perceptual task of cutting out a heart free form, but was able to trace an existing heart for use in his card with therapist assistance, verbal and occasional hand over hand guiding. Use of jumbo glue stick in right hand with cues for thoroughness of application to place heart onto card. He was unable to come up with words for his card secondary to aphasia but when given a few choices he was able to indicate The words he would like to use on his card. Therapist wrote out phrases and patient had difficulty with writing happy valentines day but was able to  write the words I love you with a good legibility as well as his name.  Although today's task was more complex and building on sequential steps, patient performed well and was pleased with the outcome of his project and was excited to present it to his wife. Patient continues to make good progress with Hand function. When writing he requires assistance for words/spelling as well as increased time and focus to complete task. Legibility was fair to good for activity this date. Goals updated to reflect progress this date.  Patient demonstrates improvements with graded pressure, improved grasp and release, improved manipulation skills and improved hand function for tasks such as self feeding and utensil/tool use. He continues to benefit from skilled occupational therapy to maximize  safety and independence and daily tasks.                   OT Education - 09/08/18 0940    Education provided  Yes    Education Details  use of loop scissors, handwriting, tracing    Person(s) Educated  Patient;Spouse    Methods  Explanation;Demonstration;Verbal cues    Comprehension  Verbalized understanding;Returned demonstration;Verbal cues required          OT Long Term Goals - 09/08/18 0942      OT LONG TERM GOAL #1   Title  Patient will demonstrate use of utensil in right hand for self feeding with modified independence with little to no spillage.     Baseline  still has diff with spillage and managing utensils at times.    Time  12    Period  Weeks    Status  On-going      OT LONG TERM GOAL #2   Title  Pt will increase hand strength in right by 5# to be able to cut food with modified independence.    Baseline  unable at eval, able to cut meat with occasional assist at 30 visit.    Time  12    Period  Weeks    Status  Achieved      OT LONG TERM GOAL #3   Title  Patient will demonstrate increased awareness and strategies to attend to right side to hold items without dropping items.    Baseline  drops items frequently, 30 visit-patient dropping objects less frequently and is more aware when holding objects in hand    Time  12    Period  Weeks    Status  On-going      OT LONG TERM GOAL #4   Title  Patient will demonstrate independence in home exercise program for ROM, strength and coordination of right hand.    Baseline  no current program, modified independent with current program at 30 visit but continuing to add exercises.    Time  6    Period  Weeks    Status  On-going      OT LONG TERM GOAL #5   Title  Patient will demonstrate the ability to obtain items from right pocket 90% of the time.    Baseline  difficulty getting keys or coins out of right pocket, 30 visit update-able to demonstrate 75% of the time    Time  12    Period  Weeks    Status   Achieved      OT LONG TERM GOAL #6   Title  Patient will demonstrate pouring a drink with the right hand with attention to right side and not knocking the cup over  and spilling.      Baseline  patient often does not realize the cup is on his right side and knocks it over when attempting to get a drink    Time  12    Period  Weeks    Status  On-going            Plan - 09/08/18 0942    Clinical Impression Statement  Although today's task was more complex and building on sequential steps, patient performed well and was pleased with the outcome of his project and was excited to present it to his wife.Patient continues to make good progress with Hand function. When writing he requires assistance for words/spelling as well as increased time and focus to complete task. Legibility was fair to good for activity this date. Goals updated to reflect progress this date.  Patient demonstrates improvements with graded pressure, improved grasp and release, improved manipulation skills and improved hand function for tasks such as self feeding and utensil/tool use. He continues to benefit from skilled occupational therapy to maximize safety and independence and daily tasks.    Occupational Profile and client history currently impacting functional performance  repeated falls, TBI, expressive aphasia, limited motion in right hand, lack of coordination from previous CVA    Occupational performance deficits (Please refer to evaluation for details):  ADL's;IADL's;Leisure    Rehab Potential  Good    Current Impairments/barriers affecting progress:  expressive aphasia, falls, neglect    OT Frequency  1x / week    OT Duration  12 weeks    OT Treatment/Interventions  Self-care/ADL training;Visual/perceptual remediation/compensation;DME and/or AE instruction;Patient/family education;Moist Heat;Therapeutic exercise;Manual Therapy;Therapeutic activities;Neuromuscular education    Consulted and Agree with Plan of Care   Patient    Family Member Consulted  wife, [name]       Patient will benefit from skilled therapeutic intervention in order to improve the following deficits and impairments:  Impaired vision/preception, Decreased coordination, Decreased safety awareness, Impaired UE functional use, Decreased strength  Visit Diagnosis: Muscle weakness (generalized)  Other lack of coordination  Neurologic neglect syndrome    Problem List Patient Active Problem List   Diagnosis Date Noted  . Sleep disturbance   . PAF (paroxysmal atrial fibrillation) ([place])   . Recurrent UTI   . Hypokalemia   . Hyponatremia   . Abnormal urine   . 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 Benign essential HTN   . Urinary retention   . History of gout   . Global aphasia   . SAH (subarachnoid hemorrhage) ([place])   . SDH (subdural hematoma) ([place])   . Lymphocytosis   . Subdural hemorrhage following injury ([place]) 05/12/2016   James [name] T Amie Cowens, OTR/L, CLT  James [name] 09/08/2018, 4:00 PM  [place] MAIN [facility] SERVICES [address] [place], [place], [address] Phone: [phone]   Fax:  [phone]  Name: James [name] MRN: [id number] Date of Birth: 1941-02-20

## 2018-09-12 ENCOUNTER — Encounter: Payer: Self-pay | Admitting: Physical Therapy

## 2018-09-12 ENCOUNTER — Ambulatory Visit: Payer: PPO | Admitting: Occupational Therapy

## 2018-09-12 ENCOUNTER — Ambulatory Visit: Payer: PPO | Admitting: Physical Therapy

## 2018-09-12 ENCOUNTER — Encounter: Payer: Self-pay | Admitting: Occupational Therapy

## 2018-09-12 DIAGNOSIS — R262 Difficulty in walking, not elsewhere classified: Secondary | ICD-10-CM

## 2018-09-12 DIAGNOSIS — R278 Other lack of coordination: Secondary | ICD-10-CM

## 2018-09-12 DIAGNOSIS — R2681 Unsteadiness on feet: Secondary | ICD-10-CM

## 2018-09-12 DIAGNOSIS — M6281 Muscle weakness (generalized): Secondary | ICD-10-CM | POA: Diagnosis not present

## 2018-09-12 DIAGNOSIS — R414 Neurologic neglect syndrome: Secondary | ICD-10-CM

## 2018-09-12 NOTE — Therapy (Signed)
[place] MAIN [facility] SERVICES [address] [place], [place], [address] Phone: [phone]   Fax:  [phone]  Physical Therapy Treatment  Patient Details  Name: James [name] MRN: [id number] Date of Birth: 1941-07-25 Referring Provider (PT): shah   Encounter Date: 09/12/2018  PT End of Session - 09/12/18 1027    Visit Number  41    Number of Visits  32    Date for PT Re-Evaluation  10/31/18    Authorization Type  1/10 starting on 06/13/18 progress note    PT Start Time  1017    PT Stop Time  1100    PT Time Calculation (min)  43 min    Equipment Utilized During Treatment  Gait belt    Activity Tolerance  Patient tolerated treatment well    Behavior During Therapy  [facility] for tasks assessed/performed       Past Medical History:  Diagnosis Date  . Alcohol abuse, in remission   . Atrial fibrillation ([place])   . Bilateral renal masses   . Closed right ankle fracture   . 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 Gait disorder   . Gout   . Hypertension   . Seizure disorder ([place])   . 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     Past Surgical History:  Procedure Laterality Date  . CRANIECTOMY FOR DEPRESSED SKULL FRACTURE  2009   with MRSA infection  . CRANIOTOMY     X 2  . HERNIA REPAIR     during childhood  . ORIF ANKLE FRACTURE Left 2012    There were no vitals filed for this visit.  Subjective Assessment - 09/12/18 1026    Subjective  Patient denies any falls or pain since last session. 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  He suffered another fall in 2017.  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    Patient Stated Goals  Patient wants to have a safer gait  and not stagger or feel off balance.     Currently in Pain?  No/denies    Multiple Pain Sites  No       Neuromuscular Re-education  Side stepping on TM at . 3 miles/ hr and elevation 1 x 3 mins left and 3 mins right  Airex balance with toe taps to 6" step alternating LE x 10 each, faded UE support, continues to need UE support 75%  Static balance on foam one LE and 6 inch stool 1 LE , head turns with no UE support 30s x 2  TM side stepping left and right x 4 mins, . 4 m/sec, x 3 mis   Therapeutic exercise:  SLR with 3 lbs x 15 x 2   Hip abd with 3 lbs x 15 x 2  hooklying abduction/ER with GTB x 15 x 2   hooklying marching x 20   Bridging x 15 x 2   Patient needs vC and TC  for correct technique and form. Pt demonstrates increased postural sway when standing on uneven surface and requires // bars to steady, and demonstrates fatigue at end of set of exercises focused on strength and endurance  PT Education - 09/12/18 1026    Education Details  HEP    Person(s) Educated  Patient    Methods  Explanation    Comprehension  Verbalized understanding;Returned demonstration;Need further instruction       PT Short Term Goals - 06/13/18 0912      PT SHORT TERM GOAL #1   Title  Patient will be independent in home exercise program to improve strength/mobility for better functional independence with ADLs.    Baseline  05/09/2018: Pt reports doing bed exercises, 06/13/18  Pt reports doing bed exercises    Time  6    Period  Weeks    Status  Partially Met    Target Date  08/29/18      PT SHORT TERM GOAL #2   Title  Patient (> 78 years old) will complete five times sit to stand test in < 15 seconds indicating an increased LE strength and improved balance.    Baseline  19.15 sec and needs CGA to min assist to keep from falling, 03/29/18=16.82 sec, 05/09/2018 15.23sec11/20/19=13.73    Time  6    Period  Weeks    Status   Partially Met    Target Date  08/29/18        PT Long Term Goals - 09/05/18 0936      PT LONG TERM GOAL #1   Title  Patient will increase Berg Balance score by > 6 points to demonstrate decreased fall risk during functional activities.    Baseline  36/56 Merrilee Jansky,;  03/29/18 Berg = 36/52; 05/09/18= 45/56, 06/13/18=43/56, 07/19/18= 44/56  :09/05/18=34/56    Time  12    Period  Weeks    Status  On-going    Target Date  10/31/18      PT LONG TERM GOAL #2   Title  Patient will reduce timed up and go to <11 seconds to reduce fall risk and demonstrate improved transfer/gait ability.    Baseline  15.18 sec with poor safety judgement and uncontrolled sit, 03/29/18=13.02 sec, 05/09/2018 10.53 sec, 06/13/18=9.48 sec, 07/19/18  10.52   09/05/18= 14.05 sec    Time  12    Period  Weeks    Status  On-going    Target Date  10/31/18      PT LONG TERM GOAL #3   Title  Patient will increase dynamic gait index score to >19/24 as to demonstrate reduced fall risk and improved dynamic gait balance for better safety with community/home ambulation.     Baseline  16/24, 03/29/18=16/25; 05/09/18=17/24, 06/13/18=17/24, 07/19/18 = 17/24 :   09/05/18=12/24    Time  12    Period  Weeks    Status  On-going    Target Date  10/31/18      PT LONG TERM GOAL #4   Title  Patient will ascend and descend 4 stairs with single rail UE support using a step-through pattern with SBA in order to demonstrate improved balance, safety, and decrease risk of falls.    Baseline  05/09/18=CGA/Min A for safety, step-to pattern, LUE support, 06/13/18= LUE support and step to pattern, 07/19/18 needs UE support  :09/05/18=LUE support and step to pattern,    Time  4    Period  Weeks    Status  On-going    Target Date  10/31/18            Plan - 09/12/18 1027    Clinical Impression Statement  Patient has incoordination of BLE during ambulation with staggering  steps and loss of balance during speed changes, turns, step ups or navigating  through obstacles. He will continue to benefit form skilled PT to improve safety and mobility    Rehab Potential  Good    PT Frequency  1x / week    PT Duration  12 weeks    PT Treatment/Interventions  Therapeutic activities;Therapeutic exercise;Balance training;Neuromuscular re-education;Functional mobility training;Gait training;Manual techniques    PT Next Visit Plan  progress balance and HEP    PT Home Exercise Plan  heel raises, bridges    Consulted and Agree with Plan of Care  Patient       Patient will benefit from skilled therapeutic intervention in order to improve the following deficits and impairments:  Abnormal gait, Decreased coordination, Decreased activity tolerance, Decreased endurance, Decreased strength, Decreased balance, Decreased safety awareness, Difficulty walking  Visit Diagnosis: Muscle weakness (generalized)  Other lack of coordination  Neurologic neglect syndrome  Unsteadiness on feet  Difficulty in walking, not elsewhere classified     Problem List Patient Active Problem List   Diagnosis Date Noted  . Sleep disturbance   . PAF (paroxysmal atrial fibrillation) ([place])   . Recurrent UTI   . Hypokalemia   . Hyponatremia   . Abnormal urine   . 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 Benign essential HTN   . Urinary retention   . History of gout   . Global aphasia   . SAH (subarachnoid hemorrhage) ([place])   . SDH (subdural hematoma) ([place])   . Lymphocytosis   . Subdural hemorrhage following injury ([place]) 05/12/2016    Arelia Sneddon S,PT DPT 09/12/2018, 10:31 AM  [place] MAIN [facility] SERVICES [address] [place], [place], [address] Phone: [phone]   Fax:  [phone]  Name: Arul Farabee MRN: [id number] Date of Birth: 1940/09/28

## 2018-09-14 NOTE — Therapy (Signed)
[place] MAIN [facility] SERVICES [address] [place], [place], [address] Phone: [phone]   Fax:  [phone]  Occupational Therapy Treatment  Patient Details  Name: James [name] MRN: [id number] Date of Birth: 08-Dec-1940 No data recorded  Encounter Date: 09/12/2018  OT End of Session - 09/14/18 1257    Visit Number  81    Number of Visits  23    Date for OT Re-Evaluation  11/28/18    OT Start Time  0925    OT Stop Time  1015    OT Time Calculation (min)  50 min    Activity Tolerance  Patient tolerated treatment well    Behavior During Therapy  [facility] for tasks assessed/performed       Past Medical History:  Diagnosis Date  . Alcohol abuse, in remission   . Atrial fibrillation ([place])   . Bilateral renal masses   . Closed right ankle fracture   . 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 Gait disorder   . Gout   . Hypertension   . Seizure disorder ([place])   . 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     Past Surgical History:  Procedure Laterality Date  . CRANIECTOMY FOR DEPRESSED SKULL FRACTURE  2009   with MRSA infection  . CRANIOTOMY     X 2  . HERNIA REPAIR     during childhood  . ORIF ANKLE FRACTURE Left 2012    There were no vitals filed for this visit.  Subjective Assessment - 09/14/18 1256    Subjective   Patient reports he is doing well, indicating he has been helping his wife at home after she had knee surgery.      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  He suffered another fall in 2017.  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     Patient Stated Goals  to be able to use the right hand functionally    Currently in Pain?  No/denies    Multiple Pain Sites  No       Neuromuscular Reeducation:  Patient seen for  functional hand skills with coordination and use of Regular scissors for cutting Shapes, circle, square, rectangle, heart, triangle cues at times to readjust grip onto scissors.  He had difficulty with loop scissors, hand tended to migrate to the middle/end of scissors and away from the loop.  He is more familiar with regular scissors and did well with task today.   Coordination skills with use of grooved pegs, patient continues to demonstrate moderate to max difficulty with placing pegs into grid.  If the pegs are placed, he is able to remove pegs with cues for prehension pattern.           Patient continues to work towards hand skills with improved attention to the right, improvements in graded grasp and improved ability to pick up and start to manipulate objects on the right side.  Continue to work towards goals in plan of care to increase functional use of right hand to improve independence in daily tasks.             OT Education - 09/14/18 1256    Education provided  Yes    Education Details  use of regular  scissors versus looped scissors    Person(s) Educated  Patient;Spouse    Methods  Explanation;Demonstration;Verbal cues    Comprehension  Verbalized understanding;Returned demonstration;Verbal cues required          OT Long Term Goals - 09/08/18 0942      OT LONG TERM GOAL #1   Title  Patient will demonstrate use of utensil in right hand for self feeding with modified independence with little to no spillage.     Baseline  still has diff with spillage and managing utensils at times.    Time  12    Period  Weeks    Status  On-going      OT LONG TERM GOAL #2   Title  Pt will increase hand strength in right by 5# to be able to cut food with modified independence.    Baseline  unable at eval, able to cut meat with occasional assist at 30 visit.    Time  12    Period  Weeks    Status  Achieved      OT LONG TERM GOAL #3   Title  Patient will demonstrate increased  awareness and strategies to attend to right side to hold items without dropping items.    Baseline  drops items frequently, 30 visit-patient dropping objects less frequently and is more aware when holding objects in hand    Time  12    Period  Weeks    Status  On-going      OT LONG TERM GOAL #4   Title  Patient will demonstrate independence in home exercise program for ROM, strength and coordination of right hand.    Baseline  no current program, modified independent with current program at 30 visit but continuing to add exercises.    Time  6    Period  Weeks    Status  On-going      OT LONG TERM GOAL #5   Title  Patient will demonstrate the ability to obtain items from right pocket 90% of the time.    Baseline  difficulty getting keys or coins out of right pocket, 30 visit update-able to demonstrate 75% of the time    Time  12    Period  Weeks    Status  Achieved      OT LONG TERM GOAL #6   Title  Patient will demonstrate pouring a drink with the right hand with attention to right side and not knocking the cup over and spilling.      Baseline  patient often does not realize the cup is on his right side and knocks it over when attempting to get a drink    Time  12    Period  Weeks    Status  On-going            Plan - 09/14/18 1258    Clinical Impression Statement  Patient continues to work towards hand skills with improved attention to the right, improvements in graded grasp and improved ability to pick up and start to manipulate objects on the right side.  Continue to work towards goals in plan of care to increase functional use of right hand to improve independence in daily tasks.      Occupational Profile and client history currently impacting functional performance  repeated falls, TBI, expressive aphasia, limited motion in right hand, lack of coordination from previous CVA    Occupational performance deficits (Please refer to evaluation for details):  ADL's;IADL's;Leisure  Rehab Potential  Good    Current Impairments/barriers affecting progress:  expressive aphasia, falls, neglect    OT Frequency  1x / week    OT Duration  12 weeks    OT Treatment/Interventions  Self-care/ADL training;Visual/perceptual remediation/compensation;DME and/or AE instruction;Patient/family education;Moist Heat;Therapeutic exercise;Manual Therapy;Therapeutic activities;Neuromuscular education    Consulted and Agree with Plan of Care  Patient       Patient will benefit from skilled therapeutic intervention in order to improve the following deficits and impairments:  Impaired vision/preception, Decreased coordination, Decreased safety awareness, Impaired UE functional use, Decreased strength  Visit Diagnosis: Muscle weakness (generalized)  Other lack of coordination  Neurologic neglect syndrome    Problem List Patient Active Problem List   Diagnosis Date Noted  . Sleep disturbance   . PAF (paroxysmal atrial fibrillation) ([place])   . Recurrent UTI   . Hypokalemia   . Hyponatremia   . Abnormal urine   . 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 Benign essential HTN   . Urinary retention   . History of gout   . Global aphasia   . SAH (subarachnoid hemorrhage) ([place])   . SDH (subdural hematoma) ([place])   . Lymphocytosis   . Subdural hemorrhage following injury ([place]) 05/12/2016   Kelsey Durflinger T Isaura Schiller, OTR/L, CLT  Franchesca Veneziano 09/14/2018, 1:02 PM  [place] MAIN [facility] SERVICES [address] [place], [place], [address] Phone: [phone]   Fax:  [phone]  Name: James [name] MRN: [id number] Date of Birth: August 18, 1940

## 2018-09-19 ENCOUNTER — Ambulatory Visit: Payer: PPO | Admitting: Physical Therapy

## 2018-09-19 ENCOUNTER — Encounter: Payer: Self-pay | Admitting: Physical Therapy

## 2018-09-19 ENCOUNTER — Ambulatory Visit: Payer: PPO | Admitting: Occupational Therapy

## 2018-09-19 DIAGNOSIS — R2681 Unsteadiness on feet: Secondary | ICD-10-CM

## 2018-09-19 DIAGNOSIS — R414 Neurologic neglect syndrome: Secondary | ICD-10-CM

## 2018-09-19 DIAGNOSIS — M6281 Muscle weakness (generalized): Secondary | ICD-10-CM

## 2018-09-19 DIAGNOSIS — R278 Other lack of coordination: Secondary | ICD-10-CM

## 2018-09-19 DIAGNOSIS — R262 Difficulty in walking, not elsewhere classified: Secondary | ICD-10-CM

## 2018-09-19 NOTE — Therapy (Signed)
[place] MAIN [facility] SERVICES [address] [place], [place], [address] Phone: [phone]   Fax:  [phone]  Physical Therapy Treatment  Patient Details  Name: James [name] MRN: [id number] Date of Birth: 26-May-1941 Referring Provider (PT): shah   Encounter Date: 09/19/2018  PT End of Session - 09/19/18 1036    Visit Number  42    Number of Visits  73    Date for PT Re-Evaluation  10/31/18    Authorization Type  2/10 starting on 09/05/18 progress note    PT Start Time  1020    PT Stop Time  1100    PT Time Calculation (min)  40 min    Equipment Utilized During Treatment  Gait belt    Activity Tolerance  Patient tolerated treatment well    Behavior During Therapy  WFL for tasks assessed/performed       Past Medical History:  Diagnosis Date  . Alcohol abuse, in remission   . Atrial fibrillation ([place])   . Bilateral renal masses   . Closed right ankle fracture   . 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 Gait disorder   . Gout   . Hypertension   . Seizure disorder ([place])   . 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     Past Surgical History:  Procedure Laterality Date  . CRANIECTOMY FOR DEPRESSED SKULL FRACTURE  2009   with MRSA infection  . CRANIOTOMY     X 2  . HERNIA REPAIR     during childhood  . ORIF ANKLE FRACTURE Left 2012    There were no vitals filed for this visit.  Subjective Assessment - 09/19/18 1035    Subjective  Patient denies any falls or pain since last session. 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  He suffered another fall in 2017.  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    Patient Stated Goals  Patient wants to have a safer gait  and not stagger or feel off balance.     Currently in Pain?  No/denies    Multiple Pain Sites  No       Treatment: [employer] fitness warm up x 3 mins  sidelying hip abd x 10 x 2  BLE SLR x 10 x 2 Bridging x 10 x 2  Hip abd/ER x 20 with BTB Leg press with 100 lbs x 20 x 2 sets  Sit to stand x 10 with feet on purple foam with min assist x 2 sets  Sit to stand with feet staggered on purple foam x 10 with min assist, left foot ahead, right foot ahead x 2 sets  Standing in parallel bars and stepping over hurdle x 10 fwd / bwd x 10 x 2 with UE assist and CGA  patient uses the back of his legs to stabilize for sit to stand and has initial static standing loss of balance on foam and has sway with static stand on foam                       PT Education - 09/19/18 1035    Education Details  HEP  Person(s) Educated  Patient    Methods  Explanation;Demonstration;Tactile cues;Verbal cues    Comprehension  Returned demonstration       PT Short Term Goals - 06/13/18 0912      PT SHORT TERM GOAL #1   Title  Patient will be independent in home exercise program to improve strength/mobility for better functional independence with ADLs.    Baseline  05/09/2018: Pt reports doing bed exercises, 06/13/18  Pt reports doing bed exercises    Time  6    Period  Weeks    Status  Partially Met    Target Date  08/29/18      PT SHORT TERM GOAL #2   Title  Patient (> 44 years old) will complete five times sit to stand test in < 15 seconds indicating an increased LE strength and improved balance.    Baseline  19.15 sec and needs CGA to min assist to keep from falling, 03/29/18=16.82 sec, 05/09/2018 15.23sec11/20/19=13.73    Time  6    Period  Weeks    Status  Partially Met    Target Date  08/29/18        PT Long Term Goals - 09/05/18 0936      PT LONG TERM GOAL #1   Title  Patient will increase Berg Balance score by > 6 points to demonstrate decreased fall risk during functional  activities.    Baseline  36/56 Merrilee Jansky,;  03/29/18 Berg = 36/52; 05/09/18= 45/56, 06/13/18=43/56, 07/19/18= 44/56  :09/05/18=34/56    Time  12    Period  Weeks    Status  On-going    Target Date  10/31/18      PT LONG TERM GOAL #2   Title  Patient will reduce timed up and go to <11 seconds to reduce fall risk and demonstrate improved transfer/gait ability.    Baseline  15.18 sec with poor safety judgement and uncontrolled sit, 03/29/18=13.02 sec, 05/09/2018 10.53 sec, 06/13/18=9.48 sec, 07/19/18  10.52   09/05/18= 14.05 sec    Time  12    Period  Weeks    Status  On-going    Target Date  10/31/18      PT LONG TERM GOAL #3   Title  Patient will increase dynamic gait index score to >19/24 as to demonstrate reduced fall risk and improved dynamic gait balance for better safety with community/home ambulation.     Baseline  16/24, 03/29/18=16/25; 05/09/18=17/24, 06/13/18=17/24, 07/19/18 = 17/24 :   09/05/18=12/24    Time  12    Period  Weeks    Status  On-going    Target Date  10/31/18      PT LONG TERM GOAL #4   Title  Patient will ascend and descend 4 stairs with single rail UE support using a step-through pattern with SBA in order to demonstrate improved balance, safety, and decrease risk of falls.    Baseline  05/09/18=CGA/Min A for safety, step-to pattern, LUE support, 06/13/18= LUE support and step to pattern, 07/19/18 needs UE support  :09/05/18=LUE support and step to pattern,    Time  4    Period  Weeks    Status  On-going    Target Date  10/31/18            Plan - 09/19/18 1039    Clinical Impression Statement  Patient required min verbal cueing during matrix machine stepping, and required CGA during all dynamic standing balance activities. Patient required occasional rest breaks between exercises  due to fatigue. Patient tolerated exercise well. Patient will continue to benefit from skilled therapy in order to improve dynamic standing balance activities and increase gait speed to reduce  risk for falls    Rehab Potential  Good    PT Frequency  1x / week    PT Duration  12 weeks    PT Treatment/Interventions  Therapeutic activities;Therapeutic exercise;Balance training;Neuromuscular re-education;Functional mobility training;Gait training;Manual techniques    PT Next Visit Plan  progress balance and HEP    PT Home Exercise Plan  heel raises, bridges    Consulted and Agree with Plan of Care  Patient       Patient will benefit from skilled therapeutic intervention in order to improve the following deficits and impairments:  Abnormal gait, Decreased coordination, Decreased activity tolerance, Decreased endurance, Decreased strength, Decreased balance, Decreased safety awareness, Difficulty walking  Visit Diagnosis: Muscle weakness (generalized)  Other lack of coordination  Neurologic neglect syndrome  Unsteadiness on feet  Difficulty in walking, not elsewhere classified     Problem List Patient Active Problem List   Diagnosis Date Noted  . Sleep disturbance   . PAF (paroxysmal atrial fibrillation) ([place])   . Recurrent UTI   . Hypokalemia   . Hyponatremia   . Abnormal urine   . 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 Benign essential HTN   . Urinary retention   . History of gout   . Global aphasia   . SAH (subarachnoid hemorrhage) ([place])   . SDH (subdural hematoma) ([place])   . Lymphocytosis   . Subdural hemorrhage following injury ([place]) 05/12/2016    Alanson Puls, PT DPT 09/19/2018, 10:40 AM  [place] MAIN [facility] SERVICES [address] [place], [place], [address] Phone: [phone]   Fax:  [phone]  Name: Zacariah Belue MRN: [id number] Date of Birth: 1941-05-23

## 2018-09-21 ENCOUNTER — Encounter: Payer: Self-pay | Admitting: Occupational Therapy

## 2018-09-21 NOTE — Therapy (Signed)
[place] MAIN [facility] SERVICES [address] [place], [place], [address] Phone: [phone]   Fax:  [phone]  Occupational Therapy Treatment  Patient Details  Name: James [name] MRN: [id number] Date of Birth: [birth year] No data recorded  Encounter Date: 09/19/2018  OT End of Session - 09/21/18 2136    Visit Number  68    Number of Visits  96    Date for OT Re-Evaluation  11/28/18    OT Start Time  0933    OT Stop Time  1015    OT Time Calculation (min)  42 min    Activity Tolerance  Patient tolerated treatment well    Behavior During Therapy  [facility] for tasks assessed/performed       Past Medical History:  Diagnosis Date  . Alcohol abuse, in remission   . Atrial fibrillation ([place])   . Bilateral renal masses   . Closed right ankle fracture   . 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 Gait disorder   . Gout   . Hypertension   . Seizure disorder ([place])   . 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     Past Surgical History:  Procedure Laterality Date  . CRANIECTOMY FOR DEPRESSED SKULL FRACTURE  2009   with MRSA infection  . CRANIOTOMY     X 2  . HERNIA REPAIR     during childhood  . ORIF ANKLE FRACTURE Left 2012    There were no vitals filed for this visit.  Subjective Assessment - 09/21/18 2134    Subjective   Patient indicates he has continued to help his wife as she recovers from knee replacement.  Wants to work on his hand more.      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  He suffered another fall in 2017.  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     Patient Stated Goals  to be able to use the right hand functionally    Currently in Pain?  No/denies    Multiple Pain Sites  No         Therapeutic exercise: Patient  seen for finger strengthening and pinch strength with use of progressive Resistance pins, Patient able to complete all levels to place onto elevated surface, cues for lateral pinch with most resistive pins, place and remove with multi directional reach.   Neuromuscular reeducation Manipulation of [place] discs to pick up from tabletop and flip to opposite side with cues for prehension patterns, to pick up between middle finger and thumb and to utilize index finger to turn object.  Patient seen for handwriting skills with large lined paper, small grip pen to write name with fair to good legibility today, copied 2 words with fair legibility.    Patient continues to progress with right hand skills, he demonstrates ability to produce graded pressure appropriately with right hand with minimal cues with use of progressive resistance pins.  Handwriting today was fair to good for name.  Continue to work towards goals to improve functional hand use for daily tasks.  He continues to benefit from skilled OT services.                    OT Education - 09/21/18 2135  Education provided  Yes    Education Details  coordination skills, using hand for storage, translatory skills    Person(s) Educated  Patient;Spouse    Methods  Explanation;Demonstration;Verbal cues    Comprehension  Verbalized understanding;Returned demonstration;Verbal cues required          OT Long Term Goals - 09/08/18 0942      OT LONG TERM GOAL #1   Title  Patient will demonstrate use of utensil in right hand for self feeding with modified independence with little to no spillage.     Baseline  still has diff with spillage and managing utensils at times.    Time  12    Period  Weeks    Status  On-going      OT LONG TERM GOAL #2   Title  Pt will increase hand strength in right by 5# to be able to cut food with modified independence.    Baseline  unable at eval, able to cut meat with occasional assist at 30 visit.     Time  12    Period  Weeks    Status  Achieved      OT LONG TERM GOAL #3   Title  Patient will demonstrate increased awareness and strategies to attend to right side to hold items without dropping items.    Baseline  drops items frequently, 30 visit-patient dropping objects less frequently and is more aware when holding objects in hand    Time  12    Period  Weeks    Status  On-going      OT LONG TERM GOAL #4   Title  Patient will demonstrate independence in home exercise program for ROM, strength and coordination of right hand.    Baseline  no current program, modified independent with current program at 30 visit but continuing to add exercises.    Time  6    Period  Weeks    Status  On-going      OT LONG TERM GOAL #5   Title  Patient will demonstrate the ability to obtain items from right pocket 90% of the time.    Baseline  difficulty getting keys or coins out of right pocket, 30 visit update-able to demonstrate 75% of the time    Time  12    Period  Weeks    Status  Achieved      OT LONG TERM GOAL #6   Title  Patient will demonstrate pouring a drink with the right hand with attention to right side and not knocking the cup over and spilling.      Baseline  patient often does not realize the cup is on his right side and knocks it over when attempting to get a drink    Time  12    Period  Weeks    Status  On-going            Plan - 09/21/18 2136    Clinical Impression Statement  Patient continues to progress with right hand skills, he demonstrates ability to produce graded pressure appropriately with right hand with minimal cues with use of progressive resistance pins.  Handwriting today was fair to good for name.  Continue to work towards goals to improve functional hand use for daily tasks.  He continues to benefit from skilled OT services.     Occupational Profile and client history currently impacting functional performance  repeated falls, TBI, expressive aphasia,  limited motion in right hand, lack of  coordination from previous CVA    Occupational performance deficits (Please refer to evaluation for details):  ADL's;IADL's;Leisure    Rehab Potential  Good    Current Impairments/barriers affecting progress:  expressive aphasia, falls, neglect    OT Frequency  1x / week    OT Duration  12 weeks    OT Treatment/Interventions  Self-care/ADL training;Visual/perceptual remediation/compensation;DME and/or AE instruction;Patient/family education;Moist Heat;Therapeutic exercise;Manual Therapy;Therapeutic activities;Neuromuscular education    Consulted and Agree with Plan of Care  Patient       Patient will benefit from skilled therapeutic intervention in order to improve the following deficits and impairments:  Impaired vision/preception, Decreased coordination, Decreased safety awareness, Impaired UE functional use, Decreased strength  Visit Diagnosis: Muscle weakness (generalized)  Other lack of coordination  Neurologic neglect syndrome    Problem List Patient Active Problem List   Diagnosis Date Noted  . Sleep disturbance   . PAF (paroxysmal atrial fibrillation) ([place])   . Recurrent UTI   . Hypokalemia   . Hyponatremia   . Abnormal urine   . 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 Benign essential HTN   . Urinary retention   . History of gout   . Global aphasia   . SAH (subarachnoid hemorrhage) ([place])   . SDH (subdural hematoma) ([place])   . Lymphocytosis   . Subdural hemorrhage following injury ([place]) 05/12/2016   Allysa Governale T Cohl Behrens, OTR/L, CLT  Donnika Kucher 09/22/2018, 1:25 PM  [place] MAIN [facility] SERVICES [address] [place], [place], [address] Phone: [phone]   Fax:  [phone]  Name: Rodrecus Belsky MRN: [id number] Date of Birth: Jun 02, 1941

## 2018-09-26 ENCOUNTER — Ambulatory Visit: Payer: PPO | Attending: Neurology | Admitting: Physical Therapy

## 2018-09-26 ENCOUNTER — Ambulatory Visit: Payer: PPO | Admitting: Occupational Therapy

## 2018-09-26 ENCOUNTER — Encounter: Payer: Self-pay | Admitting: Physical Therapy

## 2018-09-26 DIAGNOSIS — M6281 Muscle weakness (generalized): Secondary | ICD-10-CM | POA: Diagnosis not present

## 2018-09-26 DIAGNOSIS — R278 Other lack of coordination: Secondary | ICD-10-CM | POA: Insufficient documentation

## 2018-09-26 DIAGNOSIS — R2681 Unsteadiness on feet: Secondary | ICD-10-CM | POA: Diagnosis not present

## 2018-09-26 DIAGNOSIS — R414 Neurologic neglect syndrome: Secondary | ICD-10-CM

## 2018-09-26 DIAGNOSIS — R262 Difficulty in walking, not elsewhere classified: Secondary | ICD-10-CM | POA: Diagnosis not present

## 2018-09-26 NOTE — Therapy (Signed)
[place] MAIN [facility] SERVICES [address] [place], [place], [address] Phone: [phone]   Fax:  [phone]  Physical Therapy Treatment /Discharge Summary  Patient Details  Name: James [name] MRN: [id number] Date of Birth: 03/11/1941 Referring Provider (PT): shah   Encounter Date: 09/26/2018  PT End of Session - 09/26/18 0943    Visit Number  43    Number of Visits  8    Date for PT Re-Evaluation  10/31/18    Authorization Type  3/10 starting on 09/05/18 progress note    PT Start Time  0930    PT Stop Time  1010    PT Time Calculation (min)  40 min    Equipment Utilized During Treatment  Gait belt    Activity Tolerance  Patient tolerated treatment well    Behavior During Therapy  [facility] for tasks assessed/performed       Past Medical History:  Diagnosis Date  . Alcohol abuse, in remission   . Atrial fibrillation ([place])   . Bilateral renal masses   . Closed right ankle fracture   . 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 Gait disorder   . Gout   . Hypertension   . Seizure disorder ([place])   . 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     Past Surgical History:  Procedure Laterality Date  . CRANIECTOMY FOR DEPRESSED SKULL FRACTURE  2009   with MRSA infection  . CRANIOTOMY     X 2  . HERNIA REPAIR     during childhood  . ORIF ANKLE FRACTURE Left 2012    There were no vitals filed for this visit.  Subjective Assessment - 09/26/18 0942    Subjective  Patient denies any falls or pain since last session. 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  He suffered another fall in 2017.  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    Patient Stated Goals  Patient wants to  have a safer gait and not stagger or feel off balance.     Currently in Pain?  No/denies    Multiple Pain Sites  No       Goals reviewed with progress made and #1,#2,#3  not met ,goal  #4 met    Review HEP: Heel raises x20 reps bilaterally, BUE support in // bars, supervision for safety Marching x15 reps each leg, BUE support in // bars, supervision for safety Feet together head turns x 2 mins HHA on bars BUE supervision for safety, UE support VCs for proper technique and positioning for each exercise  TM sidestepping . 4 m/sec  L, R x 3 min ,bwds amb .3 m/sec  Rockerboard Lateral weight shift x10 reps each direction,50%UE support, CGA for safety with VCs to control the board tap in each direction and not letting it hit too hard  AP weight shift x10 reps, no UE support, CGA for safety with VCs to utilize ankles to transfer weight over toes and back over heels without just leaning the trunk  Four square: Step out, out, in, in forwards x2 laps, backwards x2 laps, CGA for safety, VCs to take big enough steps and to try  to increase speed to work on coordination  Side stepping in parallel bars,  CGA for safety, VCs for taking a big enough step      CGA and mod verbal cues used throughout with increased in postural sway and LOB most seen with narrow base of support and while on uneven surfaces. Continues to have balance deficits typical with diagnosis. Patient performs intermediate level exercises without pain behaviors and needs verbal cuing for postural alignment and head positioning                         PT Education - 09/26/18 0943    Education Details  HEP    Person(s) Educated  Patient    Methods  Explanation;Demonstration    Comprehension  Returned demonstration       PT Short Term Goals - 06/13/18 0912      PT SHORT TERM GOAL #1   Title  Patient will be independent in home exercise program to improve strength/mobility for better functional  independence with ADLs.    Baseline  05/09/2018: Pt reports doing bed exercises, 06/13/18  Pt reports doing bed exercises    Time  6    Period  Weeks    Status  Partially Met    Target Date  08/29/18      PT SHORT TERM GOAL #2   Title  Patient (> 48 years old) will complete five times sit to stand test in < 15 seconds indicating an increased LE strength and improved balance.    Baseline  19.15 sec and needs CGA to min assist to keep from falling, 03/29/18=16.82 sec, 05/09/2018 15.23sec11/20/19=13.73    Time  6    Period  Weeks    Status  Partially Met    Target Date  08/29/18        PT Long Term Goals - 09/26/18 0944      PT LONG TERM GOAL #1   Title  Patient will increase Berg Balance score by > 6 points to demonstrate decreased fall risk during functional activities.    Baseline  36/56 Merrilee Jansky,;  03/29/18 Berg = 36/52; 05/09/18= 45/56, 06/13/18=43/56, 07/19/18= 44/56  :09/05/18=34/56    Time  12    Period  Weeks    Status  Not Met    Target Date  09/26/18      PT LONG TERM GOAL #2   Title  Patient will reduce timed up and go to <11 seconds to reduce fall risk and demonstrate improved transfer/gait ability.    Baseline  15.18 sec with poor safety judgement and uncontrolled sit, 03/29/18=13.02 sec, 05/09/2018 10.53 sec, 06/13/18=9.48 sec, 07/19/18  10.52   09/05/18= 14.05 sec    Time  12    Period  Weeks    Status  Not Met    Target Date  09/26/18      PT LONG TERM GOAL #3   Title  Patient will increase dynamic gait index score to >19/24 as to demonstrate reduced fall risk and improved dynamic gait balance for better safety with community/home ambulation.     Baseline  16/24, 03/29/18=16/25; 05/09/18=17/24, 06/13/18=17/24, 07/19/18 = 17/24 :   09/05/18=12/24    Time  12    Period  Weeks    Status  Not Met    Target Date  09/26/18      PT LONG TERM GOAL #4   Title  Patient will ascend and descend 4 stairs with single rail UE support  using a step-through pattern with SBA in order to  demonstrate improved balance, safety, and decrease risk of falls.    Baseline  05/09/18=CGA/Min A for safety, step-to pattern, LUE support, 06/13/18= LUE support and step to pattern, 07/19/18 needs UE support  :09/05/18=LUE support and step to pattern,    Time  4    Period  Weeks    Status  Achieved    Target Date  09/26/18            Plan - 09/26/18 0947    Clinical Impression Statement  Pt requires direction and verbal cues for correct performance of exercises. Patient demonstrates improving strength in BLE and performs open and closed chain exercises and reports no of pain. Pt was able to perform all exercises with min VC for technique. Patient struggles with keeping a straight path during ambulation especially during turning and doing dual tasks..Goals were reviewd and 1 out of 4 goals met. Patient will be DCed today to HEP    Rehab Potential  Good    PT Frequency  1x / week    PT Duration  12 weeks    PT Treatment/Interventions  Therapeutic activities;Therapeutic exercise;Balance training;Neuromuscular re-education;Functional mobility training;Gait training;Manual techniques    PT Next Visit Plan  progress balance and HEP    PT Home Exercise Plan  heel raises, bridges    Consulted and Agree with Plan of Care  Patient       Patient will benefit from skilled therapeutic intervention in order to improve the following deficits and impairments:  Abnormal gait, Decreased coordination, Decreased activity tolerance, Decreased endurance, Decreased strength, Decreased balance, Decreased safety awareness, Difficulty walking  Visit Diagnosis: Muscle weakness (generalized)  Other lack of coordination  Neurologic neglect syndrome  Unsteadiness on feet  Difficulty in walking, not elsewhere classified     Problem List Patient Active Problem List   Diagnosis Date Noted  . Sleep disturbance   . PAF (paroxysmal atrial fibrillation) ([place])   . Recurrent UTI   . Hypokalemia   .  Hyponatremia   . Abnormal urine   . 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 Benign essential HTN   . Urinary retention   . History of gout   . Global aphasia   . SAH (subarachnoid hemorrhage) ([place])   . SDH (subdural hematoma) ([place])   . Lymphocytosis   . Subdural hemorrhage following injury ([place]) 05/12/2016    Alanson Puls , PT DPT 09/26/2018, 9:48 AM  [place] MAIN [facility] SERVICES [address] [place], [place], [address] Phone: [phone]   Fax:  [phone]  Name: Eron Staat MRN: [id number] Date of Birth: 1941/04/10

## 2018-09-30 ENCOUNTER — Encounter: Payer: Self-pay | Admitting: Occupational Therapy

## 2018-09-30 NOTE — Therapy (Signed)
[place] MAIN [facility] SERVICES [address] [place], [place], [address] Phone: [phone]   Fax:  [phone]  Occupational Therapy Treatment  Patient Details  Name: James [name] MRN: [id number] Date of Birth: 10/20/1940 No data recorded  Encounter Date: 09/26/2018  OT End of Session - 09/30/18 1938    Visit Number  86    Number of Visits  96    Date for OT Re-Evaluation  11/28/18    OT Start Time  0844    OT Stop Time  0930    OT Time Calculation (min)  46 min    Activity Tolerance  Patient tolerated treatment well    Behavior During Therapy  [facility] for tasks assessed/performed       Past Medical History:  Diagnosis Date  . Alcohol abuse, in remission   . Atrial fibrillation ([place])   . Bilateral renal masses   . Closed right ankle fracture   . 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 Gait disorder   . Gout   . Hypertension   . Seizure disorder ([place])   . 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     Past Surgical History:  Procedure Laterality Date  . CRANIECTOMY FOR DEPRESSED SKULL FRACTURE  2009   with MRSA infection  . CRANIOTOMY     X 2  . HERNIA REPAIR     during childhood  . ORIF ANKLE FRACTURE Left 2012    There were no vitals filed for this visit.  Subjective Assessment - 09/30/18 1936    Subjective   Patient indicating he wants to discontinue physical therapy but would like to continue with OT to work on UE, daily tasks and balance during acts in standing.      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  He suffered another fall in 2017.  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     Patient Stated Goals  to be able to use the right hand functionally    Currently in Pain?  No/denies    Multiple Pain Sites  No         Therapeutic Activities:  Patient seen this date with focus on visual scanning, right sided awareness, and visual compensatory techniques.  Patient engaged in activity with scatter colored circles (4 colors) on paper oriented horizontally, patient required to color in all circles according to color patterns.  Occasional cues required to attend to right side, missed 7 black circles, 2 blue circles and able to correct with cues.   Patient demonstrates difficulty with precision of pen to stay within the lines of the circles but has improved with attention to task and detail than with tasks in the past.   Response to treatment:Patient has continued to progress with right sided attention, visual scanning.  He continues to perform well on demand when he is aware therapist is anticipating a right sided response.  He demonstrates increased difficulty as the task changes and increases in complexity.  Continue to work towards goals to improve right sided visual awareness, attention and strategies to compensate for decreased awareness to right side in order to impact and improve independence in daily tasks.  OT Education - 09/30/18 1938    Education provided  Yes    Education Details  coordination skills, using hand for storage, translatory skills, precision with handwriting skills    Person(s) Educated  Patient;Spouse    Methods  Explanation;Demonstration;Verbal cues    Comprehension  Verbalized understanding;Returned demonstration;Verbal cues required          OT Long Term Goals - 09/08/18 0942      OT LONG TERM GOAL #1   Title  Patient will demonstrate use of utensil in right hand for self feeding with modified independence with little to no spillage.     Baseline  still has diff with spillage and managing utensils at times.    Time  12    Period  Weeks    Status  On-going      OT LONG TERM GOAL #2   Title  Pt will increase hand strength in right by 5#  to be able to cut food with modified independence.    Baseline  unable at eval, able to cut meat with occasional assist at 30 visit.    Time  12    Period  Weeks    Status  Achieved      OT LONG TERM GOAL #3   Title  Patient will demonstrate increased awareness and strategies to attend to right side to hold items without dropping items.    Baseline  drops items frequently, 30 visit-patient dropping objects less frequently and is more aware when holding objects in hand    Time  12    Period  Weeks    Status  On-going      OT LONG TERM GOAL #4   Title  Patient will demonstrate independence in home exercise program for ROM, strength and coordination of right hand.    Baseline  no current program, modified independent with current program at 30 visit but continuing to add exercises.    Time  6    Period  Weeks    Status  On-going      OT LONG TERM GOAL #5   Title  Patient will demonstrate the ability to obtain items from right pocket 90% of the time.    Baseline  difficulty getting keys or coins out of right pocket, 30 visit update-able to demonstrate 75% of the time    Time  12    Period  Weeks    Status  Achieved      OT LONG TERM GOAL #6   Title  Patient will demonstrate pouring a drink with the right hand with attention to right side and not knocking the cup over and spilling.      Baseline  patient often does not realize the cup is on his right side and knocks it over when attempting to get a drink    Time  12    Period  Weeks    Status  On-going            Plan - 09/30/18 1939    Clinical Impression Statement  Patient has continued to progress with right sided attention, visual scanning.  He continues to perform well on demand when he is aware therapist is anticipating a right sided response.  He demonstrates increased difficulty as the task changes and increases in complexity.  Continue to work towards goals to improve right sided visual awareness, attention and  strategies to compensate for decreased awareness to right side in order to impact and improve independence in  daily tasks.    Occupational Profile and client history currently impacting functional performance  repeated falls, TBI, expressive aphasia, limited motion in right hand, lack of coordination from previous CVA    Occupational performance deficits (Please refer to evaluation for details):  ADL's;IADL's;Leisure    Body Structure / Function / Physical Skills  ADL;Dexterity;Flexibility;ROM;Strength;Vision;Balance;Coordination;FMC;IADL;Tone;Endurance;UE functional use;Mobility    Cognitive Skills  Attention;Safety Awareness;Problem Solve    Psychosocial Skills  Environmental  Adaptations;Routines and Behaviors;Habits;Interpersonal Interaction    Rehab Potential  Good    OT Frequency  1x / week    OT Duration  12 weeks    OT Treatment/Interventions  Self-care/ADL training;Visual/perceptual remediation/compensation;DME and/or AE instruction;Patient/family education;Moist Heat;Therapeutic exercise;Manual Therapy;Therapeutic activities;Neuromuscular education    Consulted and Agree with Plan of Care  Patient    Family Member Consulted  wife, [name]       Patient will benefit from skilled therapeutic intervention in order to improve the following deficits and impairments:  Body Structure / Function / Physical Skills, Cognitive Skills, Psychosocial Skills  Visit Diagnosis: Muscle weakness (generalized)  Other lack of coordination  Neurologic neglect syndrome    Problem List Patient Active Problem List   Diagnosis Date Noted  . Sleep disturbance   . PAF (paroxysmal atrial fibrillation) ([place])   . Recurrent UTI   . Hypokalemia   . Hyponatremia   . Abnormal urine   . 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 Benign essential HTN   . Urinary retention   . History of gout   . Global aphasia   . SAH (subarachnoid hemorrhage) ([place])   . SDH (subdural hematoma) ([place])   .  Lymphocytosis   . Subdural hemorrhage following injury [facility]) 05/12/2016   Shaindel Sweeten T Kieara Schwark, OTR/L, CLT  Lyanna Blystone 09/30/2018, 7:47 PM  [place] MAIN [facility] SERVICES [address] [place], [place], [address] Phone: [phone]   Fax:  [phone]  Name: Gibran Veselka MRN: [id number] Date of Birth: [birth year]

## 2018-10-03 ENCOUNTER — Ambulatory Visit: Payer: PPO | Admitting: Physical Therapy

## 2018-10-03 ENCOUNTER — Other Ambulatory Visit: Payer: Self-pay

## 2018-10-03 ENCOUNTER — Ambulatory Visit: Payer: PPO | Admitting: Occupational Therapy

## 2018-10-03 DIAGNOSIS — M6281 Muscle weakness (generalized): Secondary | ICD-10-CM | POA: Diagnosis not present

## 2018-10-03 DIAGNOSIS — R2681 Unsteadiness on feet: Secondary | ICD-10-CM

## 2018-10-03 DIAGNOSIS — R278 Other lack of coordination: Secondary | ICD-10-CM

## 2018-10-03 DIAGNOSIS — R414 Neurologic neglect syndrome: Secondary | ICD-10-CM

## 2018-10-06 ENCOUNTER — Encounter: Payer: Self-pay | Admitting: Occupational Therapy

## 2018-10-06 NOTE — Therapy (Signed)
[place] MAIN [facility] SERVICES [address] [place], [place], [address] Phone: [phone]   Fax:  [phone]  Occupational Therapy Treatment  Patient Details  Name: Kayde Warehime MRN: [id number] Date of Birth: 06/24/1941 No data recorded  Encounter Date: 10/03/2018  OT End of Session - 10/06/18 1310    Visit Number  11    Number of Visits  24    Date for OT Re-Evaluation  11/28/18    OT Start Time  0835    OT Stop Time  0930    OT Time Calculation (min)  55 min    Activity Tolerance  Patient tolerated treatment well    Behavior During Therapy  [facility] for tasks assessed/performed       Past Medical History:  Diagnosis Date  . Alcohol abuse, in remission   . Atrial fibrillation ([place])   . Bilateral renal masses   . Closed right ankle fracture   . 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 Gait disorder   . Gout   . Hypertension   . Seizure disorder ([place])   . 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     Past Surgical History:  Procedure Laterality Date  . CRANIECTOMY FOR DEPRESSED SKULL FRACTURE  2009   with MRSA infection  . CRANIOTOMY     X 2  . HERNIA REPAIR     during childhood  . ORIF ANKLE FRACTURE Left 2012    There were no vitals filed for this visit.  Subjective Assessment - 10/06/18 1309    Subjective   Patient smiling on upon therapist arrival and indicates he's ready to work in the kitchen today. At the end of session he was able to tell his wife he made cookies today and say the word "Cookies".    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  He suffered another fall in 2017.  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     Patient Stated Goals  to be able to use the right hand functionally    Currently in Pain?  No/denies     Multiple Pain Sites  No       ADL/IADL:  Patient was seen in the kitchen this date for ADL task with preparation of baking cookies with premade dough, cleaning area after completion of task, placing cookies into container to take home. Patient initiated handwashing prior to the task without cues. He required min assist for directions regarding preparation, cooking times and temperatures. He required moderate cues and therapist guidance for setting temperature on stove/oven. He was able to use scissors to open package of cookie dough, remove dough and separate squares to place onto cookie sheet. He was able to use his right hand with cues to arrange cookie dough onto sheet pan. Therapist placed cookies in the oven due to safety concerns with his right hand and lack of sensation. Patient indicated the need for setting a timer but required assistance to complete this task. He engaged in cleaning up the area and washing dishes while cookies were in the oven and he was able to respond when the timer went off. Therapist removed Cookies from the oven. Patient allowed cookies to cool and used a spatula to lift cookies from sheet pan to container  and was able to place the lid securely on the container, cues to use right hand. Patient required repeated cues at times to utilize right hand during tasks, but after multiple cues he started to self-initiate use of right hand and also self-correct when he attempted to use left hand spontaneously rather than his dominant affected hand. Patient was pleased with the results and excited to show his wife what he was able to accomplish this day.  Response to tx:  Patient continues to demonstrate progress in all areas. He was able to engage in kitchen tasks this date with moderate cues initially to use right hand however over time he was able to self-correct and start to initiate use with the right hand spontaneously. Patient continues to lack sensation in his right hand and is  not safe with oven use without supervision and assistance.   Patient indicated he enjoyed working towards a purposeful activity and was excited to share his cookies with his wife and sister-in-law. Continue to work towards goals and plan of care to improve right upper extremity functional use during self-care and ADL tasks and to increase independence and decrease caregiver burden with necessary daily tasks.                    OT Education - 10/06/18 1310    Education provided  Yes    Education Details  safety in the kitchen, use of right hand with functional tasks    Person(s) Educated  Patient;Spouse    Methods  Explanation;Demonstration;Verbal cues    Comprehension  Verbalized understanding;Returned demonstration;Verbal cues required          OT Long Term Goals - 09/08/18 0942      OT LONG TERM GOAL #1   Title  Patient will demonstrate use of utensil in right hand for self feeding with modified independence with little to no spillage.     Baseline  still has diff with spillage and managing utensils at times.    Time  12    Period  Weeks    Status  On-going      OT LONG TERM GOAL #2   Title  Pt will increase hand strength in right by 5# to be able to cut food with modified independence.    Baseline  unable at eval, able to cut meat with occasional assist at 30 visit.    Time  12    Period  Weeks    Status  Achieved      OT LONG TERM GOAL #3   Title  Patient will demonstrate increased awareness and strategies to attend to right side to hold items without dropping items.    Baseline  drops items frequently, 30 visit-patient dropping objects less frequently and is more aware when holding objects in hand    Time  12    Period  Weeks    Status  On-going      OT LONG TERM GOAL #4   Title  Patient will demonstrate independence in home exercise program for ROM, strength and coordination of right hand.    Baseline  no current program, modified independent with current  program at 30 visit but continuing to add exercises.    Time  6    Period  Weeks    Status  On-going      OT LONG TERM GOAL #5   Title  Patient will demonstrate the ability to obtain items from right pocket 90% of the time.  Baseline  difficulty getting keys or coins out of right pocket, 30 visit update-able to demonstrate 75% of the time    Time  12    Period  Weeks    Status  Achieved      OT LONG TERM GOAL #6   Title  Patient will demonstrate pouring a drink with the right hand with attention to right side and not knocking the cup over and spilling.      Baseline  patient often does not realize the cup is on his right side and knocks it over when attempting to get a drink    Time  12    Period  Weeks    Status  On-going            Plan - 10/06/18 1311    Clinical Impression Statement  Patient continues to demonstrate progress in all areas. He was able to engage in kitchen tasks this date with moderate cues initially to use right hand however over time he was able to self-correct and start to initiate use with the right hand spontaneously. Patient continues to lack sensation in his right hand and is not safe with oven use without supervision and assistance.   Patient indicated he enjoyed working towards a purposeful activity and was excited to share his cookies with his wife and sister-in-law. Continue to work towards goals and plan of care to improve right upper extremity functional use during self-care and ADL tasks and to increase independence and decrease caregiver burden with necessary daily tasks.     Occupational Profile and client history currently impacting functional performance  repeated falls, TBI, expressive aphasia, limited motion in right hand, lack of coordination from previous CVA    Occupational performance deficits (Please refer to evaluation for details):  ADL's;IADL's;Leisure       Patient will benefit from skilled therapeutic intervention in order to improve  the following deficits and impairments:     Visit Diagnosis: Muscle weakness (generalized)  Other lack of coordination  Neurologic neglect syndrome  Unsteadiness on feet    Problem List Patient Active Problem List   Diagnosis Date Noted  . Sleep disturbance   . PAF (paroxysmal atrial fibrillation) ([place])   . Recurrent UTI   . Hypokalemia   . Hyponatremia   . Abnormal urine   . 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 Benign essential HTN   . Urinary retention   . History of gout   . Global aphasia   . SAH (subarachnoid hemorrhage) ([place])   . SDH (subdural hematoma) ([place])   . Lymphocytosis   . Subdural hemorrhage following injury ([place]) 05/12/2016   Amy T Lovett, OTR/L, CLT   Lovett,Amy 10/06/2018, 1:13 PM  [place] MAIN [facility] SERVICES [address] [place], [place], [address] Phone: [phone]   Fax:  [phone]  Name: Loney Domingo MRN: [id number] Date of Birth: Apr 02, 1941

## 2018-10-10 ENCOUNTER — Ambulatory Visit: Payer: PPO | Admitting: Occupational Therapy

## 2018-10-10 ENCOUNTER — Ambulatory Visit: Payer: PPO | Admitting: Physical Therapy

## 2018-10-16 ENCOUNTER — Encounter: Payer: Self-pay | Admitting: Physical Therapy

## 2018-10-16 NOTE — Therapy (Signed)
[place] MAIN [facility] SERVICES [address] [place], [place], [address] Phone: [phone]   Fax:  [phone]  October 16, 2018     Physical Therapy Discharge Summary  Patient: James [name]  MRN: [id number]  Date of Birth: 10/13/1940   Diagnosis: No diagnosis found. Referring Provider (PT): shah   The above patient had been seen in Physical Therapy 43  times of 43 treatments scheduled with 0 no shows and 0 cancellations.  The treatment consisted of neuro re-education, therapeutic exercises, therapeutic activities.  The patient is: improved  Subjective: Patient is doing well.  Discharge Findings: independent with HEP and ambulation.  Functional Status at Discharge: Patient is independent with transfers and ambulation.   PT Long Term Goals - 09/26/18 0944      PT LONG TERM GOAL #1   Title  Patient will increase Berg Balance score by > 6 points to demonstrate decreased fall risk during functional activities.    Baseline  36/56 Merrilee Jansky,;  03/29/18 Berg = 36/52; 05/09/18= 45/56, 06/13/18=43/56, 07/19/18= 44/56  :09/05/18=34/56    Time  12    Period  Weeks    Status  Not Met    Target Date  09/26/18      PT LONG TERM GOAL #2   Title  Patient will reduce timed up and go to <11 seconds to reduce fall risk and demonstrate improved transfer/gait ability.    Baseline  15.18 sec with poor safety judgement and uncontrolled sit, 03/29/18=13.02 sec, 05/09/2018 10.53 sec, 06/13/18=9.48 sec, 07/19/18  10.52   09/05/18= 14.05 sec    Time  12    Period  Weeks    Status  Not Met    Target Date  09/26/18      PT LONG TERM GOAL #3   Title  Patient will increase dynamic gait index score to >19/24 as to demonstrate reduced fall risk and improved dynamic gait balance for better safety with community/home ambulation.     Baseline  16/24, 03/29/18=16/25; 05/09/18=17/24, 06/13/18=17/24, 07/19/18 = 17/24 :   09/05/18=12/24    Time  12    Period  Weeks    Status  Not  Met    Target Date  09/26/18      PT LONG TERM GOAL #4   Title  Patient will ascend and descend 4 stairs with single rail UE support using a step-through pattern with SBA in order to demonstrate improved balance, safety, and decrease risk of falls.    Baseline  05/09/18=CGA/Min A for safety, step-to pattern, LUE support, 06/13/18= LUE support and step to pattern, 07/19/18 needs UE support  :09/05/18=LUE support and step to pattern,    Time  4    Period  Weeks    Status  Achieved    Target Date  09/26/18         Sincerely,   Alanson Puls PT, DPT     [place] [address] [place], [place], [address] Phone: [phone]   Fax:  [phone]  Patient: James [name]  MRN: [id number]  Date of Birth: 09-29-1940

## 2018-10-17 ENCOUNTER — Ambulatory Visit: Payer: PPO | Admitting: Physical Therapy

## 2018-10-17 ENCOUNTER — Encounter: Payer: PPO | Admitting: Occupational Therapy

## 2018-10-24 ENCOUNTER — Ambulatory Visit: Payer: PPO | Admitting: Physical Therapy

## 2018-10-24 ENCOUNTER — Encounter: Payer: PPO | Admitting: Occupational Therapy

## 2018-10-31 ENCOUNTER — Encounter: Payer: PPO | Admitting: Occupational Therapy

## 2018-10-31 ENCOUNTER — Ambulatory Visit: Payer: PPO | Admitting: Physical Therapy

## 2018-11-07 ENCOUNTER — Ambulatory Visit: Payer: PPO | Admitting: Physical Therapy

## 2018-11-07 ENCOUNTER — Encounter: Payer: PPO | Admitting: Occupational Therapy

## 2018-11-10 ENCOUNTER — Encounter: Payer: Self-pay | Admitting: Occupational Therapy

## 2018-11-10 NOTE — Therapy (Signed)
[place] MAIN [facility] SERVICES [address] [place], [place], [address] Phone: [phone]   Fax:  [phone]  Patient Details  Name: James [name] MRN: [id number] Date of Birth: February 28, 1941 Referring Provider:  No ref. provider found  Encounter Date: 11/10/2018  The [facility] outpatient clinics are closed at this time due to the COVID-19 epidemic. The patient was contacted in regards to their therapy services. The patient is in agreement that they are safe and consent to being on hold for therapy services until the [facility] outpatient facilities reopen. At which time, the patient will be contacted to schedule an appointment to resume therapy services.     Called patient on 11/14/18 and spoke with patient and wife.  Discussed options for potentially continuing therapy through telehealth services.  Explained how this would work and wife stated she would like to talk with patient more and get back with therapist by the end of the week.   Wife, [name] emailed to say she did not feel patient would do well with video visits, it may be too confusing to him and they prefer to wait until the clinic opens up again to return to normal therapy sessions.  In the meantime, she will work with patient at home with some of the home exercises and handwriting skills.  Therapist will check in periodically during the closure to provide support and update home exercises as needed.              Achilles Dunk, OTR/L, CLT  Lovett,Amy 11/10/2018, 9:44 AM  [place] MAIN [facility] SERVICES [address] [place], [place], [address] Phone: [phone]   Fax:  [phone]

## 2018-11-14 ENCOUNTER — Ambulatory Visit: Payer: PPO | Admitting: Physical Therapy

## 2018-11-14 ENCOUNTER — Encounter: Payer: PPO | Admitting: Occupational Therapy

## 2018-11-21 ENCOUNTER — Ambulatory Visit: Payer: PPO | Admitting: Physical Therapy

## 2018-11-21 ENCOUNTER — Encounter: Payer: PPO | Admitting: Occupational Therapy

## 2018-12-07 DIAGNOSIS — E538 Deficiency of other specified B group vitamins: Secondary | ICD-10-CM | POA: Diagnosis not present

## 2018-12-07 DIAGNOSIS — D696 Thrombocytopenia, unspecified: Secondary | ICD-10-CM | POA: Diagnosis not present

## 2018-12-07 DIAGNOSIS — I4891 Unspecified atrial fibrillation: Secondary | ICD-10-CM | POA: Diagnosis not present

## 2018-12-07 DIAGNOSIS — E7849 Other hyperlipidemia: Secondary | ICD-10-CM | POA: Diagnosis not present

## 2018-12-07 DIAGNOSIS — I10 Essential (primary) hypertension: Secondary | ICD-10-CM | POA: Diagnosis not present

## 2018-12-19 DIAGNOSIS — F3342 Major depressive disorder, recurrent, in full remission: Secondary | ICD-10-CM | POA: Diagnosis not present

## 2018-12-19 DIAGNOSIS — D696 Thrombocytopenia, unspecified: Secondary | ICD-10-CM | POA: Diagnosis not present

## 2018-12-19 DIAGNOSIS — I4891 Unspecified atrial fibrillation: Secondary | ICD-10-CM | POA: Diagnosis not present

## 2018-12-19 DIAGNOSIS — I2510 Atherosclerotic heart disease of native coronary artery without angina pectoris: Secondary | ICD-10-CM | POA: Diagnosis not present

## 2018-12-19 DIAGNOSIS — M1A09X0 Idiopathic chronic gout, multiple sites, without tophus (tophi): Secondary | ICD-10-CM | POA: Diagnosis not present

## 2018-12-19 DIAGNOSIS — E7849 Other hyperlipidemia: Secondary | ICD-10-CM | POA: Diagnosis not present

## 2018-12-19 DIAGNOSIS — E538 Deficiency of other specified B group vitamins: Secondary | ICD-10-CM | POA: Diagnosis not present

## 2018-12-19 DIAGNOSIS — I69320 Aphasia following cerebral infarction: Secondary | ICD-10-CM | POA: Diagnosis not present

## 2018-12-19 DIAGNOSIS — I10 Essential (primary) hypertension: Secondary | ICD-10-CM | POA: Diagnosis not present

## 2018-12-19 DIAGNOSIS — K219 Gastro-esophageal reflux disease without esophagitis: Secondary | ICD-10-CM | POA: Diagnosis not present

## 2018-12-19 DIAGNOSIS — Z8679 Personal history of other diseases of the circulatory system: Secondary | ICD-10-CM | POA: Diagnosis not present

## 2018-12-19 DIAGNOSIS — G40209 Localization-related (focal) (partial) symptomatic epilepsy and epileptic syndromes with complex partial seizures, not intractable, without status epilepticus: Secondary | ICD-10-CM | POA: Diagnosis not present

## 2019-02-13 DIAGNOSIS — I69320 Aphasia following cerebral infarction: Secondary | ICD-10-CM | POA: Diagnosis not present

## 2019-02-13 DIAGNOSIS — G40209 Localization-related (focal) (partial) symptomatic epilepsy and epileptic syndromes with complex partial seizures, not intractable, without status epilepticus: Secondary | ICD-10-CM | POA: Diagnosis not present

## 2019-02-13 DIAGNOSIS — R2689 Other abnormalities of gait and mobility: Secondary | ICD-10-CM | POA: Diagnosis not present

## 2019-06-17 DIAGNOSIS — I4891 Unspecified atrial fibrillation: Secondary | ICD-10-CM | POA: Diagnosis not present

## 2019-06-17 DIAGNOSIS — I10 Essential (primary) hypertension: Secondary | ICD-10-CM | POA: Diagnosis not present

## 2019-06-17 DIAGNOSIS — Z79899 Other long term (current) drug therapy: Secondary | ICD-10-CM | POA: Diagnosis not present

## 2019-06-17 DIAGNOSIS — G40209 Localization-related (focal) (partial) symptomatic epilepsy and epileptic syndromes with complex partial seizures, not intractable, without status epilepticus: Secondary | ICD-10-CM | POA: Diagnosis not present

## 2019-06-17 DIAGNOSIS — E7849 Other hyperlipidemia: Secondary | ICD-10-CM | POA: Diagnosis not present

## 2019-06-17 DIAGNOSIS — R739 Hyperglycemia, unspecified: Secondary | ICD-10-CM | POA: Diagnosis not present

## 2019-06-17 DIAGNOSIS — E538 Deficiency of other specified B group vitamins: Secondary | ICD-10-CM | POA: Diagnosis not present

## 2019-06-27 DIAGNOSIS — G40209 Localization-related (focal) (partial) symptomatic epilepsy and epileptic syndromes with complex partial seizures, not intractable, without status epilepticus: Secondary | ICD-10-CM | POA: Diagnosis not present

## 2019-06-27 DIAGNOSIS — E538 Deficiency of other specified B group vitamins: Secondary | ICD-10-CM | POA: Diagnosis not present

## 2019-06-27 DIAGNOSIS — M1A09X0 Idiopathic chronic gout, multiple sites, without tophus (tophi): Secondary | ICD-10-CM | POA: Diagnosis not present

## 2019-06-27 DIAGNOSIS — Z8679 Personal history of other diseases of the circulatory system: Secondary | ICD-10-CM | POA: Diagnosis not present

## 2019-06-27 DIAGNOSIS — E7849 Other hyperlipidemia: Secondary | ICD-10-CM | POA: Diagnosis not present

## 2019-06-27 DIAGNOSIS — F3342 Major depressive disorder, recurrent, in full remission: Secondary | ICD-10-CM | POA: Diagnosis not present

## 2019-06-27 DIAGNOSIS — Z8673 Personal history of transient ischemic attack (TIA), and cerebral infarction without residual deficits: Secondary | ICD-10-CM | POA: Diagnosis not present

## 2019-06-27 DIAGNOSIS — Z0000 Encounter for general adult medical examination without abnormal findings: Secondary | ICD-10-CM | POA: Diagnosis not present

## 2019-06-27 DIAGNOSIS — I4891 Unspecified atrial fibrillation: Secondary | ICD-10-CM | POA: Diagnosis not present

## 2019-06-27 DIAGNOSIS — D696 Thrombocytopenia, unspecified: Secondary | ICD-10-CM | POA: Diagnosis not present

## 2019-06-27 DIAGNOSIS — K219 Gastro-esophageal reflux disease without esophagitis: Secondary | ICD-10-CM | POA: Diagnosis not present

## 2019-06-27 DIAGNOSIS — I2510 Atherosclerotic heart disease of native coronary artery without angina pectoris: Secondary | ICD-10-CM | POA: Diagnosis not present

## 2019-06-27 DIAGNOSIS — I10 Essential (primary) hypertension: Secondary | ICD-10-CM | POA: Diagnosis not present

## 2019-08-23 DIAGNOSIS — S06301S Unspecified focal traumatic brain injury with loss of consciousness of 30 minutes or less, sequela: Secondary | ICD-10-CM | POA: Diagnosis not present

## 2019-08-23 DIAGNOSIS — I69320 Aphasia following cerebral infarction: Secondary | ICD-10-CM | POA: Diagnosis not present

## 2019-08-23 DIAGNOSIS — R278 Other lack of coordination: Secondary | ICD-10-CM | POA: Diagnosis not present

## 2019-08-23 DIAGNOSIS — R2689 Other abnormalities of gait and mobility: Secondary | ICD-10-CM | POA: Diagnosis not present

## 2019-10-14 ENCOUNTER — Ambulatory Visit: Payer: PPO | Attending: Neurology | Admitting: Occupational Therapy

## 2019-10-14 ENCOUNTER — Other Ambulatory Visit: Payer: Self-pay

## 2019-10-14 ENCOUNTER — Encounter: Payer: Self-pay | Admitting: Occupational Therapy

## 2019-10-14 DIAGNOSIS — M6281 Muscle weakness (generalized): Secondary | ICD-10-CM | POA: Insufficient documentation

## 2019-10-14 DIAGNOSIS — R414 Neurologic neglect syndrome: Secondary | ICD-10-CM | POA: Insufficient documentation

## 2019-10-14 DIAGNOSIS — R2681 Unsteadiness on feet: Secondary | ICD-10-CM

## 2019-10-14 DIAGNOSIS — R278 Other lack of coordination: Secondary | ICD-10-CM | POA: Diagnosis not present

## 2019-10-16 ENCOUNTER — Ambulatory Visit: Payer: PPO | Admitting: Occupational Therapy

## 2019-10-16 ENCOUNTER — Other Ambulatory Visit: Payer: Self-pay

## 2019-10-16 DIAGNOSIS — R278 Other lack of coordination: Secondary | ICD-10-CM

## 2019-10-16 DIAGNOSIS — R2681 Unsteadiness on feet: Secondary | ICD-10-CM

## 2019-10-16 DIAGNOSIS — R414 Neurologic neglect syndrome: Secondary | ICD-10-CM

## 2019-10-16 DIAGNOSIS — M6281 Muscle weakness (generalized): Secondary | ICD-10-CM

## 2019-10-18 ENCOUNTER — Encounter: Payer: Self-pay | Admitting: Occupational Therapy

## 2019-10-18 NOTE — Therapy (Signed)
[place] MAIN [facility] SERVICES [address] [place], [place], [address] Phone: [phone]   Fax:  [phone]  Occupational Therapy Evaluation  Patient Details  Name: James [name] MRN: [id number] Date of Birth: 01-30-1941 Referring Provider (OT): Joselyn Arrow   Encounter Date: 10/14/2019  OT End of Session - 10/18/19 1215    Visit Number  1    Number of Visits  24    Date for OT Re-Evaluation  01/08/20    Authorization Type  Medicare visit 1/10    OT Start Time  1102    OT Stop Time  1208    OT Time Calculation (min)  66 min    Activity Tolerance  Patient tolerated treatment well    Behavior During Therapy  [facility] for tasks assessed/performed       Past Medical History:  Diagnosis Date  . Alcohol abuse, in remission   . Atrial fibrillation ([place])   . Bilateral renal masses   . Closed right ankle fracture   . 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 Gait disorder   . Gout   . Hypertension   . Seizure disorder ([place])   . 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     Past Surgical History:  Procedure Laterality Date  . CRANIECTOMY FOR DEPRESSED SKULL FRACTURE  2009   with MRSA infection  . CRANIOTOMY     X 2  . HERNIA REPAIR     during childhood  . ORIF ANKLE FRACTURE Left 2012    There were no vitals filed for this visit.     [facility] OT Assessment - 10/18/19 1158      Assessment   Medical Diagnosis  CVA/right UE weakness    Referring Provider (OT)  Manuella Ghazi, H    Onset Date/Surgical Date  02/21/18    Hand Dominance  Right    Prior Therapy  OT, PT      Precautions   Precautions  Fall      Restrictions   Weight Bearing Restrictions  No      Balance Screen   Has the patient fallen in the past 6 months  Yes    How many times?  2    Has the patient had a decrease in activity level because of a fear of falling?    No    Is the patient reluctant to leave their home because of a fear of falling?   No      Home  Environment   Family/patient expects to be discharged to:  Private residence    Living Arrangements  Spouse/significant other    Available Help at Discharge  Family    Type of Home  Other (Comment)    Home Layout  One level    Bathroom Shower/Tub  Walk-in Shower;Curtain    [place] bars - tub/shower;Walker - 2 wheels;Walker - 4 wheels;Cane - single point;Shower seat    Lives With  Spouse      Prior Function   Level of Independence  Independent with household mobility with device    Vocation  Retired      ADL   Eating/Feeding  Needs assist with cutting food   using left hand for self feeding currently   Grooming  Modified independent    Upper Body Bathing  Modified independent    Lower Body Bathing  Modified independent    Upper Body Dressing  Increased time;Needs assist for fasteners    Lower Body Dressing  Needs assist for fasteners;Increased time    Toilet Transfer  Modified independent    Toileting - Clothing Manipulation  Increased time    Tub/Shower Transfer  Modified independent    ADL comments  Patient is currently performing all of his self care and feeding with his left hand, decreased use of right hand in the last few months.  Patient has difficulty with cutting meat, holding utensils and performing hand to mouth patterns.  He continues to demonstrate right sided neglect during functional tasks.  Decreased grip, pinch in right UE.       IADL   Prior Level of Function Shopping  independent    Shopping  Assistance for transportation    Prior Level of Function Light Housekeeping  independent    Light Housekeeping  Performs light daily tasks such as dishwashing, bed making    Prior Level of Function Meal Prep  independent    Meal Prep  Able to complete simple cold meal and snack prep    Prior  Level of Function Community Mobility  independent    Community Mobility  Relies on family or friends for transportation    Prior Level of Function Medication Managment  independent    Medication Management  Is responsible for taking medication in correct dosages at correct time    Prior Level of Function Financial Management  independent    Financial Management  Requires assistance      Mobility   Mobility Status  History of falls    Mobility Status Comments  Patient ambulating with a cane at times, decreased functional balance.        Written Expression   Dominant Hand  Right    Handwriting  Not legible      Vision - History   Baseline Vision  Wears glasses only for reading    Additional Comments  Patient demonstrates neglect on right side and decreased vision in right lower quadrant with examination.      Vision Assessment   Tracking/Visual Pursuits  Decreased smoothness of eye movement to Right inferior field    Convergence  Within functional limits      Cognition   Overall Cognitive Status  Within Functional Limits for tasks assessed    Attention  Focused    Focused Attention  Appears intact    Memory  Impaired    Memory Impairment  Decreased recall of new information    Awareness  Appears intact    Cognition Comments  Following commands consistently during evaluation      Sensation   Light Touch  Impaired Detail    Light Touch Impaired Details  Impaired RUE    Stereognosis  Impaired Detail;Impaired by gross assessment    Proprioception  Impaired by gross assessment      Coordination   Gross Motor Movements are Fluid and Coordinated  No    Fine Motor Movements are Fluid and Coordinated  No    Finger Nose Finger Test  impaired on right    9 Hole Peg Test  Right;Left    Right 9 Hole Peg Test  unable     Left 9 Hole Peg Test  33 secs      ROM / Strength   AROM / PROM / Strength  AROM;Strength  AROM   Overall AROM   Deficits    Overall AROM Comments  Patient's  AROM in shoulder WFLs, limited ROM in hand especially at the right thumb at [facility] joint.  Right shoulder flexion 142, left 150.        Strength   Overall Strength  Within functional limits for tasks performed    Overall Strength Comments  Strength in right shoulder 4/5, elbow 5/5, wrist 4/5, decreased grip strength in right hand, decreased proprioception, decreased modulation of graded grasp      Right Hand PROM   R Thumb MCP 0-60  56 Degrees   -35 extension   R Thumb IP 0-80  35 Degrees   -15 extension   R Thumb Radial ADduction/ABduction 0-55  36    R Thumb Palmar ADduction/ABduction 0-45  24      Hand Function   Right Hand Grip (lbs)  37    Right Hand Lateral Pinch  13 lbs    Right Hand 3 Point Pinch  10 lbs   slipping   Left Hand Grip (lbs)  42    Left Hand Lateral Pinch  18 lbs    Left 3 point pinch  15 lbs         tx:  Patient seen for focus on graded pressure tasks working towards awareness and modulation of grasping patterns right hand.  Patient with decreased awareness on the right, decreased initiation of functional hand use, tends to grasp tightly on objects and drops items frequently when he forgets items are in the hand.  Patient responding to cues for attention to right especially with altering grasping pattern based on object in his hand.    Patient reports right leg weakness but no pain.  Decreased functional balance with transitional movements especially with sit to stand, stand to sit and functional mobility.  Patient is aware he would benefit from PT at this time however he would like to get started with OT first and then will consider PT.   Wife reports patient will use rollator at times but he places the rollator too far out in front of him when walking.  Multiple falls in the last few months, 5+ times    Patient demonstrates increased stiffness in right hand especially at MCP joint of the thumb, decreased motion especially with ABD and extension.   Handwriting  sample with no legibility Difficulty operating cell phone again.               OT Education - 10/18/19 1214    Education provided  Yes    Education Details  Eval findings, goals, plan of care    Person(s) Educated  Patient;Spouse    Methods  Explanation;Demonstration;Verbal cues    Comprehension  Verbalized understanding;Returned demonstration;Verbal cues required          OT Long Term Goals - 10/18/19 1216      OT LONG TERM GOAL #1   Title  Patient will demonstrate use of utensil in right hand for self feeding with modified independence with little to no spillage.     Baseline  unable at eval to hold utensil    Time  12    Period  Weeks    Status  New    Target Date  01/08/20      OT LONG TERM GOAL #2   Title  Pt will increase hand strength in right by 5# to be able to cut food with modified independence.    Baseline  unable to cut meat at evaluation    Time  12    Period  Weeks    Status  New    Target Date  01/08/20      OT LONG TERM GOAL #3   Title  Patient will demonstrate increased awareness and strategies to attend to right side to hold items without dropping items.    Baseline  difficulty with detecting items in right hand at eval, forgets items are in hand    Time  12    Period  Weeks    Status  New    Target Date  01/08/20      OT LONG TERM GOAL #4   Title  Patient will demonstrate independence in home exercise program for ROM, strength and coordination of right hand.    Baseline  eval-patient has not been performing exercises from last episode of care and demonstrates decline    Time  6    Period  Weeks    Status  New    Target Date  11/25/19      OT LONG TERM GOAL #6   Title  Patient will demonstrate pouring a drink with the right hand with attention to right side and not knocking the cup over and spilling.      Baseline  unable at eval    Time  12    Period  Weeks    Status  New    Target Date  01/08/20            Plan - 10/18/19  1215    Clinical Impression Statement  Pt is a 79 yo male who suffered a fall with a TBI, subdural hemorrhage followed by a CVA in 2005 with some residual difficulties including neglect and aphasia.  He had a subsequent fall in 2017 and was seen for OT and PT with excellent progress.  When the pandemic hit, patient quarantined with his wife at [facility] and has been isolated at home for the past year.  As a result, presents with decline in function compared to last year at this time.  Patient presents with right sided neglect/visual impairment, decreased range of motion, decreased strength in right hand, decreased fine motor coordination, and decreased proprioception, motor planning which affects his ability to perform self care and IADL tasks at home and in the community.  He would benefit from skilled OT to maximize his safety and independence in daily tasks.    Occupational Profile and client history currently impacting functional performance  repeated falls, TBI, expressive aphasia, limited motion in right hand, lack of coordination from previous CVA    Occupational performance deficits (Please refer to evaluation for details):  ADL's;IADL's;Leisure    Body Structure / Function / Physical Skills  ADL;Dexterity;Flexibility;ROM;Strength;Vision;Balance;Coordination;FMC;IADL;Tone;Endurance;UE functional use;Mobility    Cognitive Skills  Attention;Safety Awareness;Problem Solve    Psychosocial Skills  Environmental  Adaptations;Routines and Behaviors;Habits;Interpersonal Interaction    Rehab Potential  Good       Patient will benefit from skilled therapeutic intervention in order to improve the following deficits and impairments:   Body Structure / Function / Physical Skills: ADL, Dexterity, Flexibility, ROM, Strength, Vision, Balance, Coordination, FMC, IADL, Tone, Endurance, UE functional use, Mobility Cognitive Skills: Attention, Safety Awareness, Problem Solve Psychosocial Skills: Environmental   Adaptations, Routines and Behaviors, Habits, Interpersonal Interaction   Visit Diagnosis: Muscle weakness (generalized)  Other lack of coordination  Neurologic neglect syndrome  Unsteadiness on feet    Problem List Patient Active Problem  List   Diagnosis Date Noted  . Sleep disturbance   . PAF (paroxysmal atrial fibrillation) ([place])   . Recurrent UTI   . Hypokalemia   . Hyponatremia   . Abnormal urine   . 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 Benign essential HTN   . Urinary retention   . History of gout   . Global aphasia   . SAH (subarachnoid hemorrhage) ([place])   . SDH (subdural hematoma) ([place])   . Lymphocytosis   . Subdural hemorrhage following injury [facility]) 05/12/2016   Tatum Massman T Abdoulie Tierce, OTR/L, CLT  Gurjot Brisco 10/18/2019, 12:41 PM  [place] MAIN [facility] SERVICES [address] [place], [place], [address] Phone: [phone]   Fax:  [phone]  Name: James [name] MRN: [id number] Date of Birth: Mar 09, 1941

## 2019-10-18 NOTE — Therapy (Signed)
[place] MAIN [facility] SERVICES [address] [place], [place], [address] Phone: [phone]   Fax:  [phone]  Occupational Therapy Treatment  Patient Details  Name: James [name] MRN: [id number] Date of Birth: 1941-04-29 Referring Provider (OT): Joselyn Arrow   Encounter Date: 10/16/2019  OT End of Session - 10/18/19 1246    Visit Number  2    Number of Visits  24    Date for OT Re-Evaluation  01/08/20    Authorization Type  Medicare visit 2    OT Start Time  1106    OT Stop Time  1205    OT Time Calculation (min)  59 min    Activity Tolerance  Patient tolerated treatment well    Behavior During Therapy  [facility] for tasks assessed/performed       Past Medical History:  Diagnosis Date  . Alcohol abuse, in remission   . Atrial fibrillation ([place])   . Bilateral renal masses   . Closed right ankle fracture   . 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 Gait disorder   . Gout   . Hypertension   . Seizure disorder ([place])   . 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     Past Surgical History:  Procedure Laterality Date  . CRANIECTOMY FOR DEPRESSED SKULL FRACTURE  2009   with MRSA infection  . CRANIOTOMY     X 2  . HERNIA REPAIR     during childhood  . ORIF ANKLE FRACTURE Left 2012    There were no vitals filed for this visit.  Subjective Assessment - 10/18/19 1245    Subjective   Patient smiling today upon therapist arrival and ready to get started.    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  He suffered another fall in 2017.  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     Patient Stated Goals  Patient indicates he wants to be stronger, use his right hand for daily tasks, be as independent as possible.    Currently in Pain?  No/denies     Multiple Pain Sites  No        Patient seen this date for strengthening tasks with grip strength on right  17# for 25 reps with cues for hand position on gripper 23# for 25 reps occasional guiding from therapist.   Moist heat to right thumb followed by PROM and stretching prior to exercises to increase motion.   Thumb ROM AAROM for flexion, extension, palmar abd, radial ABD Grasping patterns with cues Neuro: Focused on modulation of graded pressure when grasping items. Isolated finger movements working on index to facilitate flicking motion to move ball of paper, moderate difficulty.      Response to tx:  Patient demonstrates significant decline in right hand ROM, function compared to last year at this time.  Continue to provide exercises for home program to work towards goals.  Patient with aphasia and has difficulty expressing himself during session however works well with therapist using gestures and yes/no answers.  Decreased proprioception and motor planning with right hand and requires extensive cues to attend to task.  Continue OT to increase independence in daily tasks.  OT Education - 10/18/19 1246    Education provided  Yes    Education Details  graded pressure, grasping patterns, isolated finger movements on right    Person(s) Educated  Patient;Spouse    Methods  Explanation;Demonstration;Verbal cues    Comprehension  Verbalized understanding;Returned demonstration;Verbal cues required          OT Long Term Goals - 10/18/19 1216      OT LONG TERM GOAL #1   Title  Patient will demonstrate use of utensil in right hand for self feeding with modified independence with little to no spillage.     Baseline  unable at eval to hold utensil    Time  12    Period  Weeks    Status  New    Target Date  01/08/20      OT LONG TERM GOAL #2   Title  Pt will increase hand strength in right by 5# to be able to cut food with modified independence.     Baseline  unable to cut meat at evaluation    Time  12    Period  Weeks    Status  New    Target Date  01/08/20      OT LONG TERM GOAL #3   Title  Patient will demonstrate increased awareness and strategies to attend to right side to hold items without dropping items.    Baseline  difficulty with detecting items in right hand at eval, forgets items are in hand    Time  12    Period  Weeks    Status  New    Target Date  01/08/20      OT LONG TERM GOAL #4   Title  Patient will demonstrate independence in home exercise program for ROM, strength and coordination of right hand.    Baseline  eval-patient has not been performing exercises from last episode of care and demonstrates decline    Time  6    Period  Weeks    Status  New    Target Date  11/25/19      OT LONG TERM GOAL #6   Title  Patient will demonstrate pouring a drink with the right hand with attention to right side and not knocking the cup over and spilling.      Baseline  unable at eval    Time  12    Period  Weeks    Status  New    Target Date  01/08/20            Plan - 10/18/19 1247    OT Occupational Profile and History  Detailed Assessment- Review of Records and additional review of physical, cognitive, psychosocial history related to current functional performance    Occupational Profile and client history currently impacting functional performance  repeated falls, TBI, expressive aphasia, limited motion in right hand, lack of coordination from previous CVA    Occupational performance deficits (Please refer to evaluation for details):  ADL's;IADL's;Leisure    Body Structure / Function / Physical Skills  ADL;Dexterity;Flexibility;ROM;Strength;Vision;Balance;Coordination;FMC;IADL;Tone;Endurance;UE functional use;Mobility    Cognitive Skills  Attention;Safety Awareness;Problem Solve    Psychosocial Skills  Environmental  Adaptations;Routines and Behaviors;Habits;Interpersonal Interaction    Clinical Decision  Making  Several treatment options, min-mod task modification necessary    Comorbidities Affecting Occupational Performance:  May have comorbidities impacting occupational performance    Modification or Assistance to Complete Evaluation   Min-Moderate modification of tasks or assist with assess necessary to complete  eval    OT Frequency  1x / week    OT Duration  12 weeks    OT Treatment/Interventions  Self-care/ADL training;Visual/perceptual remediation/compensation;DME and/or AE instruction;Patient/family education;Moist Heat;Therapeutic exercise;Manual Therapy;Therapeutic activities;Neuromuscular education    Consulted and Agree with Plan of Care  Patient    Family Member Consulted  wife, [name]       Patient will benefit from skilled therapeutic intervention in order to improve the following deficits and impairments:   Body Structure / Function / Physical Skills: ADL, Dexterity, Flexibility, ROM, Strength, Vision, Balance, Coordination, FMC, IADL, Tone, Endurance, UE functional use, Mobility Cognitive Skills: Attention, Safety Awareness, Problem Solve Psychosocial Skills: Environmental  Adaptations, Routines and Behaviors, Habits, Interpersonal Interaction   Visit Diagnosis: Muscle weakness (generalized)  Other lack of coordination  Neurologic neglect syndrome  Unsteadiness on feet    Problem List Patient Active Problem List   Diagnosis Date Noted  . Sleep disturbance   . PAF (paroxysmal atrial fibrillation) ([place])   . Recurrent UTI   . Hypokalemia   . Hyponatremia   . Abnormal urine   . 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 Benign essential HTN   . Urinary retention   . History of gout   . Global aphasia   . SAH (subarachnoid hemorrhage) ([place])   . SDH (subdural hematoma) ([place])   . Lymphocytosis   . Subdural hemorrhage following injury [facility]) 05/12/2016   Malayia Spizzirri T Allanna Bresee, OTR/L, CLT  James [name] 10/18/2019, 12:49 PM  [place] MAIN [facility] SERVICES [address] [place], [place], [address] Phone: [phone]   Fax:  [phone]  Name: James [name] MRN: [id number] Date of Birth: 06/06/1941

## 2019-10-23 ENCOUNTER — Other Ambulatory Visit: Payer: Self-pay

## 2019-10-23 ENCOUNTER — Encounter: Payer: Self-pay | Admitting: Occupational Therapy

## 2019-10-23 ENCOUNTER — Ambulatory Visit: Payer: PPO | Admitting: Occupational Therapy

## 2019-10-23 DIAGNOSIS — M6281 Muscle weakness (generalized): Secondary | ICD-10-CM

## 2019-10-23 DIAGNOSIS — R414 Neurologic neglect syndrome: Secondary | ICD-10-CM

## 2019-10-23 DIAGNOSIS — R278 Other lack of coordination: Secondary | ICD-10-CM

## 2019-10-23 DIAGNOSIS — R2681 Unsteadiness on feet: Secondary | ICD-10-CM

## 2019-10-23 NOTE — Therapy (Signed)
[place] MAIN [facility] SERVICES [address] [place], [place], [address] Phone: [phone]   Fax:  [phone]  Occupational Therapy Treatment  Patient Details  Name: James [name] MRN: [id number] Date of Birth: 1940-11-18 Referring Provider (OT): Joselyn Arrow   Encounter Date: 10/23/2019  OT End of Session - 10/23/19 1116    Visit Number  3    Number of Visits  24    Date for OT Re-Evaluation  01/08/20    Authorization Type  medicare visit 3    OT Start Time  1110    OT Stop Time  1202    OT Time Calculation (min)  52 min    Activity Tolerance  Patient tolerated treatment well    Behavior During Therapy  [facility] for tasks assessed/performed       Past Medical History:  Diagnosis Date  . Alcohol abuse, in remission   . Atrial fibrillation ([place])   . Bilateral renal masses   . Closed right ankle fracture   . 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 Gait disorder   . Gout   . Hypertension   . Seizure disorder ([place])   . 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     Past Surgical History:  Procedure Laterality Date  . CRANIECTOMY FOR DEPRESSED SKULL FRACTURE  2009   with MRSA infection  . CRANIOTOMY     X 2  . HERNIA REPAIR     during childhood  . ORIF ANKLE FRACTURE Left 2012    There were no vitals filed for this visit.  Subjective Assessment - 10/23/19 1115    Subjective   Patient indicating he did exercise yesterday and doing well.  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  He suffered another fall in 2017.  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     Patient Stated Goals  Patient indicates he wants to be stronger, use his right hand for daily tasks, be as  independent as possible.    Currently in Pain?  No/denies    Multiple Pain Sites  No        Therapeutic Exercise: Moist heat to right hand and thumb prior to exercises to increase ROM  PROM of right thumb followed by AAROM/AROM for thumb ABD, ADD, flexion and extension, radial ABD, palmar ABD.  Patient and wife instructed on exercises for home program.   Resistive velcro checkers with index and thumb prehension to pick up 1 inch square block with cues for fingers on the wood part.  Then using the loops to place and remove with moderate cues for prehension pattern.    Neuromuscular Reeducation: Graded pressure exercise with stryofoam cup, regular and with slits to work on modulation of grasping pattern to pick up cup with visual feedback from cup.    Handwriting patient utilizing 4 different pens for handwriting this date, varying shapes and sizes of grip.  Did best with medium point marker with cues for tripod grasp on pen, adjusting as needed. Limited legibility but improved with marker today.    Response to tx:   Patient responding well to tx.  Improvements noted with prehension patterns with tip  to tip with index and thumb to pick up items.  Performance improved after heat and ROM to thumb.  Wife reports patient tends to keep thumb in flexion a lot of the time, she will plan to help with thumb exercises at home.  Continue OT to increase independence in necessary daily tasks.                  OT Education - 10/23/19 1325    Education provided  Yes    Education Details  thumb ROM/stretching, graded pressure, handwriting    Person(s) Educated  Patient;Spouse    Methods  Explanation;Demonstration;Verbal cues    Comprehension  Verbalized understanding;Returned demonstration;Verbal cues required          OT Long Term Goals - 10/18/19 1216      OT LONG TERM GOAL #1   Title  Patient will demonstrate use of utensil in right hand for self feeding with modified independence  with little to no spillage.     Baseline  unable at eval to hold utensil    Time  12    Period  Weeks    Status  New    Target Date  01/08/20      OT LONG TERM GOAL #2   Title  Pt will increase hand strength in right by 5# to be able to cut food with modified independence.    Baseline  unable to cut meat at evaluation    Time  12    Period  Weeks    Status  New    Target Date  01/08/20      OT LONG TERM GOAL #3   Title  Patient will demonstrate increased awareness and strategies to attend to right side to hold items without dropping items.    Baseline  difficulty with detecting items in right hand at eval, forgets items are in hand    Time  12    Period  Weeks    Status  New    Target Date  01/08/20      OT LONG TERM GOAL #4   Title  Patient will demonstrate independence in home exercise program for ROM, strength and coordination of right hand.    Baseline  eval-patient has not been performing exercises from last episode of care and demonstrates decline    Time  6    Period  Weeks    Status  New    Target Date  11/25/19      OT LONG TERM GOAL #6   Title  Patient will demonstrate pouring a drink with the right hand with attention to right side and not knocking the cup over and spilling.      Baseline  unable at eval    Time  12    Period  Weeks    Status  New    Target Date  01/08/20            Plan - 10/23/19 1116    Clinical Impression Statement  Patient responding well to tx.  Improvements noted with prehension patterns with tip to tip with index and thumb to pick up items.  Performance improved after heat and ROM to thumb.  Wife reports patient tends to keep thumb in flexion a lot of the time, she will plan to help with thumb exercises at home.  Continue OT to increase independence in necessary daily tasks.    OT Occupational Profile and History  Detailed Assessment- Review of Records and  additional review of physical, cognitive, psychosocial history related to  current functional performance    Occupational Profile and client history currently impacting functional performance  repeated falls, TBI, expressive aphasia, limited motion in right hand, lack of coordination from previous CVA    Occupational performance deficits (Please refer to evaluation for details):  ADL's;IADL's;Leisure    Cognitive Skills  Attention;Safety Awareness;Problem Solve    Psychosocial Skills  Environmental  Adaptations;Routines and Behaviors;Habits;Interpersonal Interaction    Rehab Potential  Good    Clinical Decision Making  Several treatment options, min-mod task modification necessary    Comorbidities Affecting Occupational Performance:  May have comorbidities impacting occupational performance    Modification or Assistance to Complete Evaluation   Min-Moderate modification of tasks or assist with assess necessary to complete eval    OT Frequency  1x / week    OT Duration  12 weeks    OT Treatment/Interventions  Self-care/ADL training;Visual/perceptual remediation/compensation;DME and/or AE instruction;Patient/family education;Moist Heat;Therapeutic exercise;Manual Therapy;Therapeutic activities;Neuromuscular education    Family Member Consulted  wife, James [name]       Patient will benefit from skilled therapeutic intervention in order to improve the following deficits and impairments:     Cognitive Skills: Attention, Safety Awareness, Problem Solve Psychosocial Skills: Environmental  Adaptations, Routines and Behaviors, Habits, Interpersonal Interaction   Visit Diagnosis: Muscle weakness (generalized)  Other lack of coordination  Neurologic neglect syndrome  Unsteadiness on feet    Problem List Patient Active Problem List   Diagnosis Date Noted  . Sleep disturbance   . PAF (paroxysmal atrial fibrillation) ([place])   . Recurrent UTI   . Hypokalemia   . Hyponatremia   . Abnormal urine   . 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 Benign essential  HTN   . Urinary retention   . History of gout   . Global aphasia   . SAH (subarachnoid hemorrhage) ([place])   . SDH (subdural hematoma) ([place])   . Lymphocytosis   . Subdural hemorrhage following injury ([place]) 05/12/2016  James [name] James [name], OTR/L, CLT   James [name] 10/23/2019, 1:33 PM  [place] MAIN [facility] SERVICES [address] [place], [place], [address] Phone: [phone]   Fax:  [phone]  Name: James [name] MRN: [id number] Date of Birth: 10-21-1940

## 2019-10-28 ENCOUNTER — Other Ambulatory Visit: Payer: Self-pay

## 2019-10-28 ENCOUNTER — Ambulatory Visit: Payer: PPO | Attending: Neurology | Admitting: Occupational Therapy

## 2019-10-28 DIAGNOSIS — M6281 Muscle weakness (generalized): Secondary | ICD-10-CM | POA: Insufficient documentation

## 2019-10-28 DIAGNOSIS — R278 Other lack of coordination: Secondary | ICD-10-CM | POA: Diagnosis not present

## 2019-10-28 DIAGNOSIS — R414 Neurologic neglect syndrome: Secondary | ICD-10-CM | POA: Insufficient documentation

## 2019-10-28 DIAGNOSIS — R2681 Unsteadiness on feet: Secondary | ICD-10-CM | POA: Diagnosis not present

## 2019-10-30 ENCOUNTER — Encounter: Payer: Self-pay | Admitting: Occupational Therapy

## 2019-10-30 ENCOUNTER — Ambulatory Visit: Payer: PPO | Admitting: Occupational Therapy

## 2019-10-30 ENCOUNTER — Other Ambulatory Visit: Payer: Self-pay

## 2019-10-30 DIAGNOSIS — M6281 Muscle weakness (generalized): Secondary | ICD-10-CM | POA: Diagnosis not present

## 2019-10-30 DIAGNOSIS — R414 Neurologic neglect syndrome: Secondary | ICD-10-CM

## 2019-10-30 DIAGNOSIS — R2681 Unsteadiness on feet: Secondary | ICD-10-CM

## 2019-10-30 DIAGNOSIS — R278 Other lack of coordination: Secondary | ICD-10-CM

## 2019-10-30 NOTE — Therapy (Signed)
[place] MAIN [facility] SERVICES [address] [place], [place], [address] Phone: [phone]   Fax:  [phone]  Occupational Therapy Treatment  Patient Details  Name: James [name] MRN: [id number] Date of Birth: September 26, 1940 Referring Provider (OT): Joselyn Arrow   Encounter Date: 10/28/2019  OT End of Session - 10/30/19 1843    Visit Number  4    Number of Visits  24    Date for OT Re-Evaluation  01/08/20    Authorization Type  medicare visit 4    OT Start Time  1102    OT Stop Time  1156    OT Time Calculation (min)  54 min    Activity Tolerance  Patient tolerated treatment well    Behavior During Therapy  [facility] for tasks assessed/performed       Past Medical History:  Diagnosis Date  . Alcohol abuse, in remission   . Atrial fibrillation ([place])   . Bilateral renal masses   . Closed right ankle fracture   . 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 Gait disorder   . Gout   . Hypertension   . Seizure disorder ([place])   . 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     Past Surgical History:  Procedure Laterality Date  . CRANIECTOMY FOR DEPRESSED SKULL FRACTURE  2009   with MRSA infection  . CRANIOTOMY     X 2  . HERNIA REPAIR     during childhood  . ORIF ANKLE FRACTURE Left 2012    There were no vitals filed for this visit.  Subjective Assessment - 10/30/19 1841    Subjective   Patient and wife report having a nice Easter Weekend, they were able to have visitors at [facility] for the first time in over a year.    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  He suffered another fall in 2017.  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     Patient Stated Goals  Patient indicates he wants to be stronger, use his right hand for daily tasks, be as  independent as possible.    Currently in Pain?  No/denies    Multiple Pain Sites  No      Therapeutic Exercises: Patient seen for PROM of Thumb with prolonged stretching to MCP joint followed by AAROM/AROM for thumb radial abd, palmar abd, attempts at extension.  Manual stretching to web space of right hand prior to active use for strengthening tasks.  Patient seen for strengthening of right hand for grip with use of Pegs jumbo with place into grid, removing with grip 4th setting (23#) for 2 sets.   Attempted finger strengthening exercises with use of push pins to place into bulletin board however patient was unable to place pins safely, modified task to therapist placing into the board and patient removing with cues for prehension patterns.  Neuro: Manipulation of Small pegs with attempts to pick up from tabletop but has increased difficulty.  Patient able to take from therapist and place into board with cues for tip to tip prehension patterns.  Patient seen for manipulation of Cards with use of scatter pile method, picking up and placing in numerical order, ascending with occasional cues for right sided attention.    Response to  tx: Patient with arthritic changes in right hand at thumb, responds well to manual stretching and PROM followed by active movements, slowly increasing ROM of webspace to be able to open hand with greater ROM to pick up items and wrap hand around a cup.  Patient performing well with targeted grip strength but also responding well to cues to modulate graded pressure with grasping patterns.  Continue OT to increase independence in daily tasks for home, in the community.                       OT Education - 10/30/19 1842    Education provided  Yes    Education Details  thumb ROM/stretching, graded pressure task    Person(s) Educated  Patient;Spouse    Methods  Explanation;Demonstration;Verbal cues    Comprehension  Verbalized understanding;Returned  demonstration;Verbal cues required          OT Long Term Goals - 10/18/19 1216      OT LONG TERM GOAL #1   Title  Patient will demonstrate use of utensil in right hand for self feeding with modified independence with little to no spillage.     Baseline  unable at eval to hold utensil    Time  12    Period  Weeks    Status  New    Target Date  01/08/20      OT LONG TERM GOAL #2   Title  Pt will increase hand strength in right by 5# to be able to cut food with modified independence.    Baseline  unable to cut meat at evaluation    Time  12    Period  Weeks    Status  New    Target Date  01/08/20      OT LONG TERM GOAL #3   Title  Patient will demonstrate increased awareness and strategies to attend to right side to hold items without dropping items.    Baseline  difficulty with detecting items in right hand at eval, forgets items are in hand    Time  12    Period  Weeks    Status  New    Target Date  01/08/20      OT LONG TERM GOAL #4   Title  Patient will demonstrate independence in home exercise program for ROM, strength and coordination of right hand.    Baseline  eval-patient has not been performing exercises from last episode of care and demonstrates decline    Time  6    Period  Weeks    Status  New    Target Date  11/25/19      OT LONG TERM GOAL #6   Title  Patient will demonstrate pouring a drink with the right hand with attention to right side and not knocking the cup over and spilling.      Baseline  unable at eval    Time  12    Period  Weeks    Status  New    Target Date  01/08/20            Plan - 10/30/19 1844    Clinical Impression Statement  Patient with arthritic changes in right hand at thumb, responds well to manual stretching and PROM followed by active movements, slowly increasing ROM of webspace to be able to open hand with greater ROM to pick up items and wrap hand around a cup.  Patient performing well with targeted grip  strength but also  responding well to cues to modulate graded pressure with grasping patterns.  Continue OT to increase independence in daily tasks for home, in the community.    OT Occupational Profile and History  Detailed Assessment- Review of Records and additional review of physical, cognitive, psychosocial history related to current functional performance    Occupational Profile and client history currently impacting functional performance  repeated falls, TBI, expressive aphasia, limited motion in right hand, lack of coordination from previous CVA    Occupational performance deficits (Please refer to evaluation for details):  ADL's;IADL's;Leisure    Body Structure / Function / Physical Skills  ADL;Dexterity;Flexibility;ROM;Strength;Vision;Balance;Coordination;FMC;IADL;Tone;Endurance;UE functional use;Mobility    Cognitive Skills  Attention;Safety Awareness;Problem Solve    Psychosocial Skills  Environmental  Adaptations;Routines and Behaviors;Habits;Interpersonal Interaction    Rehab Potential  Good    Clinical Decision Making  Several treatment options, min-mod task modification necessary    Comorbidities Affecting Occupational Performance:  May have comorbidities impacting occupational performance    Modification or Assistance to Complete Evaluation   Min-Moderate modification of tasks or assist with assess necessary to complete eval    OT Frequency  1x / week    OT Duration  12 weeks    OT Treatment/Interventions  Self-care/ADL training;Visual/perceptual remediation/compensation;DME and/or AE instruction;Patient/family education;Moist Heat;Therapeutic exercise;Manual Therapy;Therapeutic activities;Neuromuscular education    Consulted and Agree with Plan of Care  Patient    Family Member Consulted  wife, James [name]       Patient will benefit from skilled therapeutic intervention in order to improve the following deficits and impairments:   Body Structure / Function / Physical Skills: ADL, Dexterity,  Flexibility, ROM, Strength, Vision, Balance, Coordination, FMC, IADL, Tone, Endurance, UE functional use, Mobility Cognitive Skills: Attention, Safety Awareness, Problem Solve Psychosocial Skills: Environmental  Adaptations, Routines and Behaviors, Habits, Interpersonal Interaction   Visit Diagnosis: Muscle weakness (generalized)  Other lack of coordination  Neurologic neglect syndrome  Unsteadiness on feet    Problem List Patient Active Problem List   Diagnosis Date Noted  . Sleep disturbance   . PAF (paroxysmal atrial fibrillation) ([place])   . Recurrent UTI   . Hypokalemia   . Hyponatremia   . Abnormal urine   . 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 Benign essential HTN   . Urinary retention   . History of gout   . Global aphasia   . SAH (subarachnoid hemorrhage) ([place])   . SDH (subdural hematoma) ([place])   . Lymphocytosis   . Subdural hemorrhage following injury [facility]) 05/12/2016   James [name] James [name], OTR/L, CLT  James [name] 10/30/2019, 6:55 PM  [place] MAIN [facility] SERVICES [address] [place], [place], [address] Phone: [phone]   Fax:  [phone]  Name: James [name] MRN: [id number] Date of Birth: [birth year]

## 2019-11-01 ENCOUNTER — Encounter: Payer: Self-pay | Admitting: Occupational Therapy

## 2019-11-01 NOTE — Therapy (Signed)
[place] MAIN [facility] SERVICES [address] [place], [place], [address] Phone: [phone]   Fax:  [phone]  Occupational Therapy Treatment  Patient Details  Name: James [name] MRN: [id number] Date of Birth: 1941/05/15 Referring Provider (OT): Joselyn Arrow   Encounter Date: 10/30/2019  OT End of Session - 11/01/19 1143    Visit Number  5    Number of Visits  24    Date for OT Re-Evaluation  01/08/20    Authorization Type  medicare visit 5    OT Start Time  1100    OT Stop Time  1147    OT Time Calculation (min)  47 min    Activity Tolerance  Patient tolerated treatment well    Behavior During Therapy  [facility] for tasks assessed/performed       Past Medical History:  Diagnosis Date  . Alcohol abuse, in remission   . Atrial fibrillation ([place])   . Bilateral renal masses   . Closed right ankle fracture   . 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 Gait disorder   . Gout   . Hypertension   . Seizure disorder ([place])   . 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     Past Surgical History:  Procedure Laterality Date  . CRANIECTOMY FOR DEPRESSED SKULL FRACTURE  2009   with MRSA infection  . CRANIOTOMY     X 2  . HERNIA REPAIR     during childhood  . ORIF ANKLE FRACTURE Left 2012    There were no vitals filed for this visit.  Subjective Assessment - 11/01/19 1137    Subjective   Patient indicating he has been working on exercises at home and is doing well.  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  He suffered another fall in 2017.  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     Patient Stated Goals  Patient indicates he wants to be stronger, use his right  hand for daily tasks, be as independent as possible.    Currently in Pain?  No/denies    Multiple Pain Sites  No       Therapeutic Exercise:  Patient seen for PROM of Thumb with prolonged stretching to MCP joint followed by AAROM/AROM for thumb radial abd, palmar abd, attempts at extension.  Manual stretching to web space of right hand prior to active use for strengthening tasks.  Patient seen for reaching tasks with right UE with cues for opening web space and thumb with attempts at grasping items.    Neuro: Focus this date on modulation of grasping patterns for graded pressure.  Attempted a variety of items with changes in size, weight and shape with cues for grasping patterns and graded pressure for each item.  Patient has the most difficulty with lightweight items with changing his grasping pattern to light pressure.  Cues for hand placement on items especially with full grasping patterns.    Response to tx:   Patient continues to progress well in all areas.  He has been consistent with performing exercises at home.  Continues to demonstrate difficulty with modulation of graded pressure applied to items with grasping, manipulation and  picking up.  Patient responds well to cues but also requires tactile cues at times for desired responses.  Continue OT to increase functional use of right hand with necessary daily activities.                      OT Education - 11/01/19 1142    Education provided  Yes    Education Details  HEP for thumb extension, graded pressure    Person(s) Educated  Patient;Spouse    Methods  Explanation;Demonstration;Verbal cues    Comprehension  Verbalized understanding;Returned demonstration;Verbal cues required          OT Long Term Goals - 10/18/19 1216      OT LONG TERM GOAL #1   Title  Patient will demonstrate use of utensil in right hand for self feeding with modified independence with little to no spillage.     Baseline  unable at eval  to hold utensil    Time  12    Period  Weeks    Status  New    Target Date  01/08/20      OT LONG TERM GOAL #2   Title  Pt will increase hand strength in right by 5# to be able to cut food with modified independence.    Baseline  unable to cut meat at evaluation    Time  12    Period  Weeks    Status  New    Target Date  01/08/20      OT LONG TERM GOAL #3   Title  Patient will demonstrate increased awareness and strategies to attend to right side to hold items without dropping items.    Baseline  difficulty with detecting items in right hand at eval, forgets items are in hand    Time  12    Period  Weeks    Status  New    Target Date  01/08/20      OT LONG TERM GOAL #4   Title  Patient will demonstrate independence in home exercise program for ROM, strength and coordination of right hand.    Baseline  eval-patient has not been performing exercises from last episode of care and demonstrates decline    Time  6    Period  Weeks    Status  New    Target Date  11/25/19      OT LONG TERM GOAL #6   Title  Patient will demonstrate pouring a drink with the right hand with attention to right side and not knocking the cup over and spilling.      Baseline  unable at eval    Time  12    Period  Weeks    Status  New    Target Date  01/08/20            Plan - 11/01/19 1144    Clinical Impression Statement  Patient continues to progress well in all areas.  He has been consistent with performing exercises at home.  Continues to demonstrate difficulty with modulation of graded pressure applied to items with grasping, manipulation and picking up.  Patient responds well to cues but also requires tactile cues at times for desired responses.  Continue OT to increase functional use of right hand with necessary daily activities.    OT Occupational Profile and History  Detailed Assessment- Review of Records and additional review of physical, cognitive, psychosocial history related to current  functional performance    Occupational  Profile and client history currently impacting functional performance  repeated falls, TBI, expressive aphasia, limited motion in right hand, lack of coordination from previous CVA    Occupational performance deficits (Please refer to evaluation for details):  ADL's;IADL's;Leisure    Body Structure / Function / Physical Skills  ADL;Dexterity;Flexibility;ROM;Strength;Vision;Balance;Coordination;FMC;IADL;Tone;Endurance;UE functional use;Mobility    Cognitive Skills  Attention;Safety Awareness;Problem Solve    Psychosocial Skills  Environmental  Adaptations;Routines and Behaviors;Habits;Interpersonal Interaction    Rehab Potential  Good    Clinical Decision Making  Several treatment options, min-mod task modification necessary    Comorbidities Affecting Occupational Performance:  May have comorbidities impacting occupational performance    Modification or Assistance to Complete Evaluation   Min-Moderate modification of tasks or assist with assess necessary to complete eval    OT Frequency  2x / week    OT Duration  12 weeks    OT Treatment/Interventions  Self-care/ADL training;Visual/perceptual remediation/compensation;DME and/or AE instruction;Patient/family education;Moist Heat;Therapeutic exercise;Manual Therapy;Therapeutic activities;Neuromuscular education    Consulted and Agree with Plan of Care  Patient    Family Member Consulted  wife, [name]       Patient will benefit from skilled therapeutic intervention in order to improve the following deficits and impairments:   Body Structure / Function / Physical Skills: ADL, Dexterity, Flexibility, ROM, Strength, Vision, Balance, Coordination, FMC, IADL, Tone, Endurance, UE functional use, Mobility Cognitive Skills: Attention, Safety Awareness, Problem Solve Psychosocial Skills: Environmental  Adaptations, Routines and Behaviors, Habits, Interpersonal Interaction   Visit Diagnosis: Muscle weakness  (generalized)  Other lack of coordination  Neurologic neglect syndrome  Unsteadiness on feet    Problem List Patient Active Problem List   Diagnosis Date Noted  . Sleep disturbance   . PAF (paroxysmal atrial fibrillation) ([place])   . Recurrent UTI   . Hypokalemia   . Hyponatremia   . Abnormal urine   . 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 Benign essential HTN   . Urinary retention   . History of gout   . Global aphasia   . SAH (subarachnoid hemorrhage) ([place])   . SDH (subdural hematoma) ([place])   . Lymphocytosis   . Subdural hemorrhage following injury ([place]) 05/12/2016   Jordie Schreur T Niyati Heinke, OTR/L, CLT  Amahia Madonia 11/01/2019, 11:55 AM  [place] MAIN [facility] SERVICES [address] [place], [place], [address] Phone: [phone]   Fax:  [phone]  Name: James [name] MRN: [id number] Date of Birth: Aug 31, 1940

## 2019-11-04 ENCOUNTER — Encounter: Payer: Self-pay | Admitting: Occupational Therapy

## 2019-11-04 ENCOUNTER — Ambulatory Visit: Payer: PPO | Admitting: Occupational Therapy

## 2019-11-04 ENCOUNTER — Other Ambulatory Visit: Payer: Self-pay

## 2019-11-04 DIAGNOSIS — R278 Other lack of coordination: Secondary | ICD-10-CM

## 2019-11-04 DIAGNOSIS — R414 Neurologic neglect syndrome: Secondary | ICD-10-CM

## 2019-11-04 DIAGNOSIS — R2681 Unsteadiness on feet: Secondary | ICD-10-CM

## 2019-11-04 DIAGNOSIS — M6281 Muscle weakness (generalized): Secondary | ICD-10-CM | POA: Diagnosis not present

## 2019-11-06 ENCOUNTER — Ambulatory Visit: Payer: PPO | Admitting: Occupational Therapy

## 2019-11-06 ENCOUNTER — Other Ambulatory Visit: Payer: Self-pay

## 2019-11-06 DIAGNOSIS — M6281 Muscle weakness (generalized): Secondary | ICD-10-CM | POA: Diagnosis not present

## 2019-11-06 DIAGNOSIS — R414 Neurologic neglect syndrome: Secondary | ICD-10-CM

## 2019-11-06 DIAGNOSIS — R2681 Unsteadiness on feet: Secondary | ICD-10-CM

## 2019-11-06 DIAGNOSIS — R278 Other lack of coordination: Secondary | ICD-10-CM

## 2019-11-06 NOTE — Therapy (Signed)
[place] MAIN [facility] SERVICES [address] [place], [place], [address] Phone: [phone]   Fax:  [phone]  Occupational Therapy Treatment  Patient Details  Name: James [name] MRN: [id number] Date of Birth: 1941/05/02 Referring Provider (OT): Joselyn Arrow   Encounter Date: 11/04/2019  OT End of Session - 11/06/19 1514    Visit Number  6    Number of Visits  24    Date for OT Re-Evaluation  01/08/20    Authorization Type  6    OT Start Time  1104    OT Stop Time  1155    OT Time Calculation (min)  51 min    Activity Tolerance  Patient tolerated treatment well    Behavior During Therapy  [facility] for tasks assessed/performed       Past Medical History:  Diagnosis Date  . Alcohol abuse, in remission   . Atrial fibrillation ([place])   . Bilateral renal masses   . Closed right ankle fracture   . 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 Gait disorder   . Gout   . Hypertension   . Seizure disorder ([place])   . 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     Past Surgical History:  Procedure Laterality Date  . CRANIECTOMY FOR DEPRESSED SKULL FRACTURE  2009   with MRSA infection  . CRANIOTOMY     X 2  . HERNIA REPAIR     during childhood  . ORIF ANKLE FRACTURE Left 2012    There were no vitals filed for this visit.  Subjective Assessment - 11/05/19 1513    Subjective   Patient reports he is doing well, has been doing exercises at home with thumb, no complaints of pain.    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  He suffered another fall in 2017.  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     Currently in Pain?  No/denies    Multiple Pain Sites  No       Moist heat to right thumb and hand for 5 mins prior to therapeutic exercise. Patient  seen for thumb ROM, passively performed by therapist, then followed by AAROM with cues for thumb radial ABD, palmar ABD and extension.  Did not work on flexion since patient tends to keep right thumb in flexion most of the time and lacks motion in all other directions.  Focused prolonged stretching to web space as well as MCP joint of the thumb.  Patient able to demonstrate exercises with cues.  Tends to stretch more at the thumb IP rather than MCP. Grasp and release of tennis ball in right hand to promote greater web space.  Cues for correct positioning of ball in the hand.  Handwriting with larger black marker for name, limited legibility.   Use of scissors, regular and looped for cutting shapes, moderate difficulty with regular scissors, improved with loop scissors but often lost grasp and required readjustment in hand by therapist.    Response to tx: Patient continues to make progress with right thumb for ROM for use in functional hand tasks.  Patient demonstrates improved grasping patterns with cues, increased range of motion in web space of the right hand with wrapping fingers around items such as a cup.  Patient requires cues and reinforcement of thumb ROM and stretches for home program.  Patient demonstrating increased spontaneous use of left hand in the last couple sessions.  Wife reports improved use of hand with self feeding at home.  Difficulty with scissor use both regular and looped scissors.  Continue to work towards goals to improve functional use of right hand for necessary daily tasks.                     OT Education - 11/06/19 1514    Education provided  Yes    Education Details  HEP for thumb extension, graded pressure    Person(s) Educated  Patient;Spouse    Methods  Explanation;Demonstration;Verbal cues    Comprehension  Verbalized understanding;Returned demonstration;Verbal cues required          OT Long Term Goals - 10/18/19 1216      OT LONG TERM  GOAL #1   Title  Patient will demonstrate use of utensil in right hand for self feeding with modified independence with little to no spillage.     Baseline  unable at eval to hold utensil    Time  12    Period  Weeks    Status  New    Target Date  01/08/20      OT LONG TERM GOAL #2   Title  Pt will increase hand strength in right by 5# to be able to cut food with modified independence.    Baseline  unable to cut meat at evaluation    Time  12    Period  Weeks    Status  New    Target Date  01/08/20      OT LONG TERM GOAL #3   Title  Patient will demonstrate increased awareness and strategies to attend to right side to hold items without dropping items.    Baseline  difficulty with detecting items in right hand at eval, forgets items are in hand    Time  12    Period  Weeks    Status  New    Target Date  01/08/20      OT LONG TERM GOAL #4   Title  Patient will demonstrate independence in home exercise program for ROM, strength and coordination of right hand.    Baseline  eval-patient has not been performing exercises from last episode of care and demonstrates decline    Time  6    Period  Weeks    Status  New    Target Date  11/25/19      OT LONG TERM GOAL #6   Title  Patient will demonstrate pouring a drink with the right hand with attention to right side and not knocking the cup over and spilling.      Baseline  unable at eval    Time  12    Period  Weeks    Status  New    Target Date  01/08/20            Plan - 11/06/19 1515    Clinical Impression Statement  Patient continues to make progress with right thumb for ROM for use in functional hand tasks.  Patient demonstrates improved grasping patterns with cues, increased range of motion in web space of the right hand with wrapping fingers around items such as a cup.  Patient requires cues and reinforcement of thumb ROM and stretches for home program.  Patient demonstrating increased spontaneous use of left  hand in the  last couple sessions.  Wife reports improved use of hand with self feeding at home.  Difficulty with scissor use both regular and looped scissors.  Continue to work towards goals to improve functional use of right hand for necessary daily tasks.    OT Occupational Profile and History  Detailed Assessment- Review of Records and additional review of physical, cognitive, psychosocial history related to current functional performance    Occupational Profile and client history currently impacting functional performance  repeated falls, TBI, expressive aphasia, limited motion in right hand, lack of coordination from previous CVA    Occupational performance deficits (Please refer to evaluation for details):  ADL's;IADL's;Leisure    Body Structure / Function / Physical Skills  ADL;Dexterity;Flexibility;ROM;Strength;Vision;Balance;Coordination;FMC;IADL;Tone;Endurance;UE functional use;Mobility    Cognitive Skills  Attention;Safety Awareness;Problem Solve    Psychosocial Skills  Environmental  Adaptations;Routines and Behaviors;Habits;Interpersonal Interaction    Rehab Potential  Good    Clinical Decision Making  Several treatment options, min-mod task modification necessary    Comorbidities Affecting Occupational Performance:  May have comorbidities impacting occupational performance    Modification or Assistance to Complete Evaluation   Min-Moderate modification of tasks or assist with assess necessary to complete eval    OT Frequency  2x / week    OT Duration  12 weeks    OT Treatment/Interventions  Self-care/ADL training;Visual/perceptual remediation/compensation;DME and/or AE instruction;Patient/family education;Moist Heat;Therapeutic exercise;Manual Therapy;Therapeutic activities;Neuromuscular education    Consulted and Agree with Plan of Care  Patient    Family Member Consulted  wife, [name]       Patient will benefit from skilled therapeutic intervention in order to improve the following deficits and  impairments:   Body Structure / Function / Physical Skills: ADL, Dexterity, Flexibility, ROM, Strength, Vision, Balance, Coordination, FMC, IADL, Tone, Endurance, UE functional use, Mobility Cognitive Skills: Attention, Safety Awareness, Problem Solve Psychosocial Skills: Environmental  Adaptations, Routines and Behaviors, Habits, Interpersonal Interaction   Visit Diagnosis: Muscle weakness (generalized)  Other lack of coordination  Neurologic neglect syndrome  Unsteadiness on feet    Problem List Patient Active Problem List   Diagnosis Date Noted  . Sleep disturbance   . PAF (paroxysmal atrial fibrillation) ([place])   . Recurrent UTI   . Hypokalemia   . Hyponatremia   . Abnormal urine   . 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 Benign essential HTN   . Urinary retention   . History of gout   . Global aphasia   . SAH (subarachnoid hemorrhage) ([place])   . SDH (subdural hematoma) ([place])   . Lymphocytosis   . Subdural hemorrhage following injury [facility]) 05/12/2016   Tasman Zapata T Ethel Meisenheimer, OTR/L, CLT  Latondra Gebhart 11/06/2019, 3:31 PM  [place] MAIN [facility] SERVICES [address] [place], [place], [address] Phone: [phone]   Fax:  [phone]  Name: James [name] MRN: [id number] Date of Birth: 08/24/1940

## 2019-11-07 ENCOUNTER — Encounter: Payer: Self-pay | Admitting: Occupational Therapy

## 2019-11-07 NOTE — Therapy (Signed)
[place] MAIN [facility] SERVICES [address] [place], [place], [address] Phone: [phone]   Fax:  [phone]  Occupational Therapy Treatment  Patient Details  Name: James [name] MRN: [id number] Date of Birth: 03-09-1941 Referring Provider (OT): Joselyn Arrow   Encounter Date: 11/06/2019  OT End of Session - 11/07/19 0839    Visit Number  7    Number of Visits  24    Date for OT Re-Evaluation  01/08/20    Authorization Type  7    OT Start Time  1106    OT Stop Time  1155    OT Time Calculation (min)  49 min    Activity Tolerance  Patient tolerated treatment well    Behavior During Therapy  [facility] for tasks assessed/performed       Past Medical History:  Diagnosis Date  . Alcohol abuse, in remission   . Atrial fibrillation ([place])   . Bilateral renal masses   . Closed right ankle fracture   . 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 Gait disorder   . Gout   . Hypertension   . Seizure disorder ([place])   . 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     Past Surgical History:  Procedure Laterality Date  . CRANIECTOMY FOR DEPRESSED SKULL FRACTURE  2009   with MRSA infection  . CRANIOTOMY     X 2  . HERNIA REPAIR     during childhood  . ORIF ANKLE FRACTURE Left 2012    There were no vitals filed for this visit.  Subjective Assessment - 11/07/19 0837    Subjective   Patient indicating he is doing well, has a birthday coming up next month and is able to tell therapist the date and how old he will be.    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  He suffered another fall in 2017.  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     Patient Stated Goals  Patient indicates he wants to be stronger, use his right hand for daily tasks, be as independent as  possible.    Currently in Pain?  No/denies    Multiple Pain Sites  No       Moist heat to right hand and thumb for 5 mins prior to therapeutic exercise  Patient seen for PROM of right thumb in all joints and planes with prolonged stretching to MCP joint for extension and ABD.  Reinstruction of stretches for home program.  AAROM for thumb extension, IP extension, radial and palmar ABD with cues and therapist assist to move through motions.  Manual stretches to right web space  Patient seen for exercise to promote thumb extension with activating pen top with thumb to click pen on and off, cues from therapist for technique.    Patient seen for handwriting this date for first and last name on small lined paper and then on larger lined paper. Patient requires moderate and continual Cues for adjusting pen grip, multiple pens used and did best with gel ink this date.  Samples shown to wife and she states it is the best writing she has seen patient do in over 16 years.    Response to tx:   Continued progress with improved ROM at the  thumb and hand for use in functional tasks.  He continues to demonstrate difficulty with graded pressure activities but is able to adjust at times and now recognizing more the need to do so.  Patient continues to demonstrate right sided inattention and has a decreased ability to detect when objects are still in his hand at times.  Patient did best today with handwriting with use of larger lined paper and a gel ink pen today, he does require continual cues for secure tripod grasp and the need to adjust pen in his hand while writing at times.  Continue to work towards goals in plan of care to improve right UE functional use for necessary daily tasks at home and in the community.                     OT Education - 11/07/19 [phone]    Education provided  Yes    Education Details  handwriting, tripod grasp onto pen and using click top pen for thumb engagement  exercises.    Person(s) Educated  Patient;Spouse    Methods  Explanation;Demonstration;Verbal cues    Comprehension  Verbalized understanding;Returned demonstration;Verbal cues required          OT Long Term Goals - 11/07/19 0841      OT LONG TERM GOAL #1   Title  Patient will demonstrate use of utensil in right hand for self feeding with modified independence with little to no spillage.     Baseline  unable at eval to hold utensil    Time  12    Period  Weeks    Status  On-going    Target Date  01/08/20      OT LONG TERM GOAL #2   Title  Pt will increase hand strength in right by 5# to be able to cut food with modified independence.    Baseline  unable to cut meat at evaluation    Time  12    Period  Weeks    Status  On-going    Target Date  01/08/20      OT LONG TERM GOAL #3   Title  Patient will demonstrate increased awareness and strategies to attend to right side to hold items without dropping items.    Baseline  difficulty with detecting items in right hand at eval, forgets items are in hand    Time  12    Period  Weeks    Status  On-going    Target Date  01/08/20      OT LONG TERM GOAL #4   Title  Patient will demonstrate independence in home exercise program for ROM, strength and coordination of right hand.    Baseline  eval-patient has not been performing exercises from last episode of care and demonstrates decline    Time  6    Period  Weeks    Status  On-going    Target Date  11/25/19      OT LONG TERM GOAL #6   Title  Patient will demonstrate pouring a drink with the right hand with attention to right side and not knocking the cup over and spilling.      Baseline  unable at eval    Time  12    Period  Weeks    Status  On-going    Target Date  01/08/20            Plan - 11/07/19 0839    Clinical Impression Statement  Continued progress with improved ROM at the thumb and hand for use in functional tasks.  He continues to demonstrate difficulty  with graded pressure activities but is able to adjust at times and now recognizing more the need to do so.  Patient continues to demonstrate right sided inattention and has a decreased ability to detect when objects are still in his hand at times.  Patient did best today with handwriting with use of larger lined paper and a gel ink pen today, he does require continual cues for secure tripod grasp and the need to adjust pen in his hand while writing at times.  Continue to work towards goals in plan of care to improve right UE functional use for necessary daily tasks at home and in the community.    OT Occupational Profile and History  Detailed Assessment- Review of Records and additional review of physical, cognitive, psychosocial history related to current functional performance    Occupational Profile and client history currently impacting functional performance  repeated falls, TBI, expressive aphasia, limited motion in right hand, lack of coordination from previous CVA    Occupational performance deficits (Please refer to evaluation for details):  ADL's;IADL's;Leisure    Body Structure / Function / Physical Skills  ADL;Dexterity;Flexibility;ROM;Strength;Vision;Balance;Coordination;FMC;IADL;Tone;Endurance;UE functional use;Mobility    Cognitive Skills  Attention;Safety Awareness;Problem Solve    Psychosocial Skills  Environmental  Adaptations;Routines and Behaviors;Habits;Interpersonal Interaction    Rehab Potential  Good    Clinical Decision Making  Several treatment options, min-mod task modification necessary    Comorbidities Affecting Occupational Performance:  May have comorbidities impacting occupational performance    Modification or Assistance to Complete Evaluation   Min-Moderate modification of tasks or assist with assess necessary to complete eval    OT Frequency  2x / week    OT Duration  12 weeks    OT Treatment/Interventions  Self-care/ADL training;Visual/perceptual  remediation/compensation;DME and/or AE instruction;Patient/family education;Moist Heat;Therapeutic exercise;Manual Therapy;Therapeutic activities;Neuromuscular education    Consulted and Agree with Plan of Care  Patient       Patient will benefit from skilled therapeutic intervention in order to improve the following deficits and impairments:   Body Structure / Function / Physical Skills: ADL, Dexterity, Flexibility, ROM, Strength, Vision, Balance, Coordination, FMC, IADL, Tone, Endurance, UE functional use, Mobility Cognitive Skills: Attention, Safety Awareness, Problem Solve Psychosocial Skills: Environmental  Adaptations, Routines and Behaviors, Habits, Interpersonal Interaction   Visit Diagnosis: Muscle weakness (generalized)  Other lack of coordination  Neurologic neglect syndrome  Unsteadiness on feet    Problem List Patient Active Problem List   Diagnosis Date Noted  . Sleep disturbance   . PAF (paroxysmal atrial fibrillation) ([place])   . Recurrent UTI   . Hypokalemia   . Hyponatremia   . Abnormal urine   . 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 Benign essential HTN   . Urinary retention   . History of gout   . Global aphasia   . SAH (subarachnoid hemorrhage) ([place])   . SDH (subdural hematoma) ([place])   . Lymphocytosis   . Subdural hemorrhage following injury [facility]) 05/12/2016   James [name] T Willye Javier, OTR/L, CLT  Fortino Haag 11/07/2019, 8:56 AM  [place] MAIN [facility] SERVICES [address] [place], [place], [address] Phone: [phone]   Fax:  [phone]  Name: James [name] MRN: [id number] Date of Birth: 05/16/1941

## 2019-11-11 ENCOUNTER — Ambulatory Visit: Payer: PPO | Admitting: Occupational Therapy

## 2019-11-11 ENCOUNTER — Other Ambulatory Visit: Payer: Self-pay

## 2019-11-11 DIAGNOSIS — M6281 Muscle weakness (generalized): Secondary | ICD-10-CM

## 2019-11-11 DIAGNOSIS — R2681 Unsteadiness on feet: Secondary | ICD-10-CM

## 2019-11-11 DIAGNOSIS — R278 Other lack of coordination: Secondary | ICD-10-CM

## 2019-11-11 DIAGNOSIS — R414 Neurologic neglect syndrome: Secondary | ICD-10-CM

## 2019-11-12 ENCOUNTER — Encounter: Payer: Self-pay | Admitting: Occupational Therapy

## 2019-11-12 NOTE — Therapy (Signed)
[place] MAIN [facility] SERVICES [address] [place], [place], [address] Phone: [phone]   Fax:  [phone]  Occupational Therapy Treatment  Patient Details  Name: James [name] MRN: [id number] Date of Birth: [birth year] Referring Provider (OT): Joselyn Arrow   Encounter Date: 11/11/2019  OT End of Session - 11/15/19 1932    Visit Number  8    Number of Visits  24    Date for OT Re-Evaluation  01/08/20    Authorization Type  8    OT Start Time  1101    OT Stop Time  1150    OT Time Calculation (min)  49 min    Activity Tolerance  Patient tolerated treatment well    Behavior During Therapy  [facility] for tasks assessed/performed       Past Medical History:  Diagnosis Date  . Alcohol abuse, in remission   . Atrial fibrillation ([place])   . Bilateral renal masses   . Closed right ankle fracture   . 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 Gait disorder   . Gout   . Hypertension   . Seizure disorder ([place])   . 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     Past Surgical History:  Procedure Laterality Date  . CRANIECTOMY FOR DEPRESSED SKULL FRACTURE  2009   with MRSA infection  . CRANIOTOMY     X 2  . HERNIA REPAIR     during childhood  . ORIF ANKLE FRACTURE Left 2012    There were no vitals filed for this visit.  Subjective Assessment - 11/15/19 1932    Subjective   Patient asking if he has to come on Wednesday, not sure if he wants to continue with 2 times a week.  Discussed with patient his progress and need to continue with 2 times a week, patient and wife agree.  Wife would like to be able to take patient out to eat and feels he needs to improve with right hand prior to going out.    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  He suffered another fall  in 2017.  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     Patient Stated Goals  Patient indicates he wants to be stronger, use his right hand for daily tasks, be as independent as possible.    Currently in Pain?  No/denies    Multiple Pain Sites  No       Patient reports performing thumb exercises at home.  With review, he tends to still moves thumb IP joint only.  Reinstruction for PROM to MCP of thumb, instructed wife as well.  Therapeutic Activity:   Patient seen for activity with large design copy with integration of right sided visual awareness as well as coordination skills.  Cues for tip to tip prehension patterns on the right.  Patient starting to initiate tasks more with right hand spontaneously and when he doesn't he is able to often detect when attempting with left and will self correct.  Patient required min to moderate cues for task.    Response to tx:  Patient continues to progress towards goals, increased spontaneous use of right hand during tasks as well as increased self correction when not initiating use of right hand during  task.  Patient continues to require cues for prehension patterns on the right.  Performing exercises for thumb ROM at home but continues to require cues for correct form and technique.  Continue OT towards goals in plan of care to maximize safety and independence in daily tasks.                      OT Education - 11/15/19 1932    Education provided  Yes    Education Details  thumb exercises, ROM, functional hand use, prehension patterns.    Person(s) Educated  Patient;Spouse    Methods  Explanation;Demonstration;Verbal cues    Comprehension  Verbalized understanding;Returned demonstration;Verbal cues required          OT Long Term Goals - 11/07/19 0841      OT LONG TERM GOAL #1   Title  Patient will demonstrate use of utensil in right hand for self feeding with modified independence with little to no spillage.      Baseline  unable at eval to hold utensil    Time  12    Period  Weeks    Status  On-going    Target Date  01/08/20      OT LONG TERM GOAL #2   Title  Pt will increase hand strength in right by 5# to be able to cut food with modified independence.    Baseline  unable to cut meat at evaluation    Time  12    Period  Weeks    Status  On-going    Target Date  01/08/20      OT LONG TERM GOAL #3   Title  Patient will demonstrate increased awareness and strategies to attend to right side to hold items without dropping items.    Baseline  difficulty with detecting items in right hand at eval, forgets items are in hand    Time  12    Period  Weeks    Status  On-going    Target Date  01/08/20      OT LONG TERM GOAL #4   Title  Patient will demonstrate independence in home exercise program for ROM, strength and coordination of right hand.    Baseline  eval-patient has not been performing exercises from last episode of care and demonstrates decline    Time  6    Period  Weeks    Status  On-going    Target Date  11/25/19      OT LONG TERM GOAL #6   Title  Patient will demonstrate pouring a drink with the right hand with attention to right side and not knocking the cup over and spilling.      Baseline  unable at eval    Time  12    Period  Weeks    Status  On-going    Target Date  01/08/20            Plan - 11/15/19 1949    Clinical Impression Statement  Patient continues to progress towards goals, increased spontaneous use of right hand during tasks as well as increased self correction when not initiating use of right hand during task.  Patient continues to require cues for prehension patterns on the right.  Performing exercises for thumb ROM at home but continues to require cues for correct form and technique.  Continue OT towards goals in plan of care to maximize safety and independence in daily tasks.    OT Occupational  Profile and History  Detailed Assessment- Review of Records  and additional review of physical, cognitive, psychosocial history related to current functional performance    Occupational Profile and client history currently impacting functional performance  repeated falls, TBI, expressive aphasia, limited motion in right hand, lack of coordination from previous CVA    Occupational performance deficits (Please refer to evaluation for details):  ADL's;IADL's;Leisure    Body Structure / Function / Physical Skills  ADL;Dexterity;Flexibility;ROM;Strength;Vision;Balance;Coordination;FMC;IADL;Tone;Endurance;UE functional use;Mobility    Cognitive Skills  Attention;Safety Awareness;Problem Solve    Psychosocial Skills  Environmental  Adaptations;Routines and Behaviors;Habits;Interpersonal Interaction    Rehab Potential  Good    Clinical Decision Making  Several treatment options, min-mod task modification necessary    Modification or Assistance to Complete Evaluation   Min-Moderate modification of tasks or assist with assess necessary to complete eval    OT Frequency  2x / week    OT Duration  12 weeks    OT Treatment/Interventions  Self-care/ADL training;Visual/perceptual remediation/compensation;DME and/or AE instruction;Patient/family education;Moist Heat;Therapeutic exercise;Manual Therapy;Therapeutic activities;Neuromuscular education    Consulted and Agree with Plan of Care  Patient    Family Member Consulted  wife, [name]       Patient will benefit from skilled therapeutic intervention in order to improve the following deficits and impairments:   Body Structure / Function / Physical Skills: ADL, Dexterity, Flexibility, ROM, Strength, Vision, Balance, Coordination, FMC, IADL, Tone, Endurance, UE functional use, Mobility Cognitive Skills: Attention, Safety Awareness, Problem Solve Psychosocial Skills: Environmental  Adaptations, Routines and Behaviors, Habits, Interpersonal Interaction   Visit Diagnosis: Other lack of coordination  Muscle weakness  (generalized)  Neurologic neglect syndrome  Unsteadiness on feet    Problem List Patient Active Problem List   Diagnosis Date Noted  . Sleep disturbance   . PAF (paroxysmal atrial fibrillation) ([place])   . Recurrent UTI   . Hypokalemia   . Hyponatremia   . Abnormal urine   . 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 Benign essential HTN   . Urinary retention   . History of gout   . Global aphasia   . SAH (subarachnoid hemorrhage) ([place])   . SDH (subdural hematoma) ([place])   . Lymphocytosis   . Subdural hemorrhage following injury [facility]) 05/12/2016   Ezmae Speers T Marcus Groll, OTR/L, CLT  Caleen Taaffe 11/15/2019, 7:50 PM  [place] MAIN [facility] SERVICES [address] [place], [place], [address] Phone: [phone]   Fax:  [phone]  Name: James [name] MRN: [id number] Date of Birth: 1941-01-18

## 2019-11-13 ENCOUNTER — Ambulatory Visit: Payer: PPO | Admitting: Occupational Therapy

## 2019-11-13 ENCOUNTER — Other Ambulatory Visit: Payer: Self-pay

## 2019-11-13 DIAGNOSIS — R414 Neurologic neglect syndrome: Secondary | ICD-10-CM

## 2019-11-13 DIAGNOSIS — R2681 Unsteadiness on feet: Secondary | ICD-10-CM

## 2019-11-13 DIAGNOSIS — M6281 Muscle weakness (generalized): Secondary | ICD-10-CM

## 2019-11-13 DIAGNOSIS — R278 Other lack of coordination: Secondary | ICD-10-CM

## 2019-11-15 ENCOUNTER — Encounter: Payer: Self-pay | Admitting: Occupational Therapy

## 2019-11-15 NOTE — Therapy (Signed)
[place] MAIN [facility] SERVICES [address] [place], [place], [address] Phone: [phone]   Fax:  [phone]  Occupational Therapy Treatment  Patient Details  Name: James [name] MRN: [id number] Date of Birth: Apr 11, 1941 Referring Provider (OT): Joselyn Arrow   Encounter Date: 11/13/2019  OT End of Session - 11/15/19 1952    Visit Number  9    Number of Visits  24    Date for OT Re-Evaluation  01/08/20    Authorization Type  9    OT Start Time  1100    OT Stop Time  1146    OT Time Calculation (min)  46 min    Activity Tolerance  Patient tolerated treatment well    Behavior During Therapy  [facility] for tasks assessed/performed       Past Medical History:  Diagnosis Date  . Alcohol abuse, in remission   . Atrial fibrillation ([place])   . Bilateral renal masses   . Closed right ankle fracture   . 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 Gait disorder   . Gout   . Hypertension   . Seizure disorder ([place])   . 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     Past Surgical History:  Procedure Laterality Date  . CRANIECTOMY FOR DEPRESSED SKULL FRACTURE  2009   with MRSA infection  . CRANIOTOMY     X 2  . HERNIA REPAIR     during childhood  . ORIF ANKLE FRACTURE Left 2012    There were no vitals filed for this visit.  Subjective Assessment - 11/15/19 1951    Subjective   Patient ready to work today.    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  He suffered another fall in 2017.  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     Patient Stated Goals  Patient indicates he wants to be stronger, use his right hand for daily tasks, be as independent as possible.    Currently in Pain?  No/denies    Multiple Pain Sites  No       Neuromuscular  Reeducation: Patient seen this date for focus on fine motor coordination and functional hand skills with right UE.  Manipulation of variety of sized nuts and bolts with tool bench, use of tools and without.  Cues at times for prehension patterns, stabilizing bolt with right hand while manipulation with left.  Attempts for manipulation of bolt and nut with right but has difficulty.  Able to hold tool with cues for adjusting hand/grip.  Patient with increased focus with task this date.    Response to tx:   Patient demonstrates difficulty with manipulation of nuts and bolts with right hand but able to use hand to stabilize while left hand performs manipulation.  This may be due also to using tool bench and therefore vision is occluded with task.  Will continue to work towards goals in plan of care to increase functional use of right UE, patient more engaged in task this date with a familiar task.                   OT Education - 11/15/19 1952    Education provided  Yes    Education Details  thumb exercises, ROM,  functional hand use, prehension patterns.    Person(s) Educated  Patient;Spouse    Methods  Explanation;Demonstration;Verbal cues    Comprehension  Verbalized understanding;Returned demonstration;Verbal cues required          OT Long Term Goals - 11/07/19 0841      OT LONG TERM GOAL #1   Title  Patient will demonstrate use of utensil in right hand for self feeding with modified independence with little to no spillage.     Baseline  unable at eval to hold utensil    Time  12    Period  Weeks    Status  On-going    Target Date  01/08/20      OT LONG TERM GOAL #2   Title  Pt will increase hand strength in right by 5# to be able to cut food with modified independence.    Baseline  unable to cut meat at evaluation    Time  12    Period  Weeks    Status  On-going    Target Date  01/08/20      OT LONG TERM GOAL #3   Title  Patient will demonstrate increased  awareness and strategies to attend to right side to hold items without dropping items.    Baseline  difficulty with detecting items in right hand at eval, forgets items are in hand    Time  12    Period  Weeks    Status  On-going    Target Date  01/08/20      OT LONG TERM GOAL #4   Title  Patient will demonstrate independence in home exercise program for ROM, strength and coordination of right hand.    Baseline  eval-patient has not been performing exercises from last episode of care and demonstrates decline    Time  6    Period  Weeks    Status  On-going    Target Date  11/25/19      OT LONG TERM GOAL #6   Title  Patient will demonstrate pouring a drink with the right hand with attention to right side and not knocking the cup over and spilling.      Baseline  unable at eval    Time  12    Period  Weeks    Status  On-going    Target Date  01/08/20            Plan - 11/15/19 1953    Clinical Impression Statement  Patient demonstrates difficulty with manipulation of nuts and bolts with right hand but able to use hand to stabilize while left hand performs manipulation.  This may be due also to using tool bench and therefore vision is occluded with task.  Will continue to work towards goals in plan of care to increase functional use of right UE, patient more engaged in task this date with a familiar task.    OT Occupational Profile and History  Detailed Assessment- Review of Records and additional review of physical, cognitive, psychosocial history related to current functional performance    Occupational Profile and client history currently impacting functional performance  repeated falls, TBI, expressive aphasia, limited motion in right hand, lack of coordination from previous CVA    Occupational performance deficits (Please refer to evaluation for details):  ADL's;IADL's;Leisure    Body Structure / Function / Physical Skills   ADL;Dexterity;Flexibility;ROM;Strength;Vision;Balance;Coordination;FMC;IADL;Tone;Endurance;UE functional use;Mobility    Cognitive Skills  Attention;Safety Awareness;Problem Solve    Psychosocial Skills  Environmental  Adaptations;Routines and Behaviors;Habits;Interpersonal Interaction    Clinical Decision Making  Several treatment options, min-mod task modification necessary    Comorbidities Affecting Occupational Performance:  May have comorbidities impacting occupational performance    Modification or Assistance to Complete Evaluation   Min-Moderate modification of tasks or assist with assess necessary to complete eval    OT Frequency  2x / week    OT Duration  12 weeks    OT Treatment/Interventions  Self-care/ADL training;Visual/perceptual remediation/compensation;DME and/or AE instruction;Patient/family education;Moist Heat;Therapeutic exercise;Manual Therapy;Therapeutic activities;Neuromuscular education    Consulted and Agree with Plan of Care  Patient       Patient will benefit from skilled therapeutic intervention in order to improve the following deficits and impairments:   Body Structure / Function / Physical Skills: ADL, Dexterity, Flexibility, ROM, Strength, Vision, Balance, Coordination, FMC, IADL, Tone, Endurance, UE functional use, Mobility Cognitive Skills: Attention, Safety Awareness, Problem Solve Psychosocial Skills: Environmental  Adaptations, Routines and Behaviors, Habits, Interpersonal Interaction   Visit Diagnosis: Other lack of coordination  Muscle weakness (generalized)  Neurologic neglect syndrome  Unsteadiness on feet    Problem List Patient Active Problem List   Diagnosis Date Noted  . Sleep disturbance   . PAF (paroxysmal atrial fibrillation) ([place])   . Recurrent UTI   . Hypokalemia   . Hyponatremia   . Abnormal urine   . 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 Benign essential HTN   . Urinary retention   . History of gout   .  Global aphasia   . SAH (subarachnoid hemorrhage) ([place])   . SDH (subdural hematoma) ([place])   . Lymphocytosis   . Subdural hemorrhage following injury [facility]) 05/12/2016   Laverna Dossett T Jehu Mccauslin, OTR/L, CLT  James [name] 11/15/2019, 8:01 PM  [place] MAIN [facility] SERVICES [address] [place], [place], [address] Phone: [phone]   Fax:  [phone]  Name: James [name] MRN: [id number] Date of Birth: [birth year]

## 2019-11-18 ENCOUNTER — Ambulatory Visit: Payer: PPO | Admitting: Occupational Therapy

## 2019-11-18 ENCOUNTER — Encounter: Payer: Self-pay | Admitting: Occupational Therapy

## 2019-11-18 ENCOUNTER — Other Ambulatory Visit: Payer: Self-pay

## 2019-11-18 DIAGNOSIS — R278 Other lack of coordination: Secondary | ICD-10-CM

## 2019-11-18 DIAGNOSIS — M6281 Muscle weakness (generalized): Secondary | ICD-10-CM | POA: Diagnosis not present

## 2019-11-18 DIAGNOSIS — R414 Neurologic neglect syndrome: Secondary | ICD-10-CM

## 2019-11-18 NOTE — Therapy (Signed)
[place] MAIN [facility] SERVICES [address] [place], [place], [address] Phone: [phone]   Fax:  [phone]  Occupational Therapy Treatment/10th visit/Progress Update Reporting period from 10/14/2019 to 11/18/2019  Patient Details  Name: James [name] MRN: [id number] Date of Birth: November 07, 1940 Referring Provider (OT): Joselyn Arrow   Encounter Date: 11/18/2019  OT End of Session - 11/19/19 1753    Visit Number  10    Number of Visits  24    Date for OT Re-Evaluation  01/08/20    Authorization Type  10 progress note    OT Start Time  1101    OT Stop Time  1150    OT Time Calculation (min)  49 min    Activity Tolerance  Patient tolerated treatment well    Behavior During Therapy  [facility] for tasks assessed/performed       Past Medical History:  Diagnosis Date  . Alcohol abuse, in remission   . Atrial fibrillation ([place])   . Bilateral renal masses   . Closed right ankle fracture   . 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 Gait disorder   . Gout   . Hypertension   . Seizure disorder ([place])   . 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     Past Surgical History:  Procedure Laterality Date  . CRANIECTOMY FOR DEPRESSED SKULL FRACTURE  2009   with MRSA infection  . CRANIOTOMY     X 2  . HERNIA REPAIR     during childhood  . ORIF ANKLE FRACTURE Left 2012    There were no vitals filed for this visit.  Subjective Assessment - 11/20/19 1753    Subjective   Patient reports he has been working on exercises at home and showing therapist.  Pleased with his progress so far with return to therapy.    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  He suffered another fall in 2017.  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     Patient Stated Goals  Patient  indicates he wants to be stronger, use his right hand for daily tasks, be as independent as possible.    Currently in Pain?  No/denies    Multiple Pain Sites  No       Patient was seen this date for reassessment of function skills as outlined in above. Please refer for details of grip and strength measurements. Patient seen for manipulation [place] discs with right hand with moderate cues to pick up object between thumb and middle finger while attempting to use index finger to turn objects, flip to opposite colored side. Patient tends to want to pick up with thumb and index prehension patterns which is what we've been working on for the past few sessions, he responds well to cues and therapist demo for task.   Reviewed outcomes of testing and measurements with patient and wife regarding patient's progress, goals and plan of care.  We discussed the importance of continuing with two times a week at this point since patient is making significant gains in all areas.   Response to treatment: Patient is making good progress with noted improvements in grip strength on the right hand, coordination and improved initiation of hand use for daily tasks. Patient is starting to initiate  in use right hand more and has some self-awareness when he starts to use left hand, he will pause and often switch back to his right dominant affected hand for the remainder of the task. He continues to demonstrate right sided neglect and continues to grip items at times with too much pressure, but also forgets items in his hand and drops items frequently. His wife reports he knocked over a tea glass three different times in one day.  Will continue to work towards improved awareness as well as modifications and equipment for activities to perform at home with greater independence and safety. Patient was unable to pick up or complete nine hole peg test at evaluation however today, at the 10th visit, he was able to now pick up pegs  with min to moderate difficulty for turning items and place into grid.  Able to complete the test in a little over six minutes.  Patient continues to benefit from skilled occupational therapy for improved right sided awareness, right hand function, right strength, coordination, improved and function to participate in self-care and ADL tasks with greater independence.                 OT Education - 11/19/19 1753    Education provided  Yes    Education Details  progress, goals, prehension patterns with [facility]    Person(s) Educated  Patient;Spouse    Methods  Explanation;Demonstration;Verbal cues    Comprehension  Verbalized understanding;Returned demonstration;Verbal cues required          OT Long Term Goals - 11/18/19 1110      OT LONG TERM GOAL #1   Title  Patient will demonstrate use of utensil in right hand for self feeding with modified independence with little to no spillage.     Baseline  unable at eval to hold utensil, 10th visit holding utensil, some feeding    Time  12    Period  Weeks    Status  On-going    Target Date  01/08/20      OT LONG TERM GOAL #2   Title  Pt will increase hand strength in right by 5# to be able to cut food with modified independence.    Baseline  unable to cut meat at evaluation, 10th visit starting to work on cutting meat    Time  12    Period  Weeks    Status  On-going    Target Date  01/08/20      OT [place] #3   Title  Patient will demonstrate increased awareness and strategies to attend to right side to hold items without dropping items.    Baseline  difficulty with detecting items in right hand at eval, forgets items are in hand, 10th visit improved but still working on    Time  12    Period  Weeks    Status  On-going    Target Date  01/08/20      OT LONG TERM GOAL #4   Title  Patient will demonstrate independence in home exercise program for ROM, strength and coordination of right hand.    Baseline  eval-patient has  not been performing exercises from last episode of care and demonstrates decline, 10th visit doing exercises at home and continue to modify program    Time  6    Period  Weeks    Status  On-going    Target Date  11/25/19      OT LONG TERM  GOAL #6   Title  Patient will demonstrate pouring a drink with the right hand with attention to right side and not knocking the cup over and spilling.      Baseline  unable at eval, 10th visit starting to use right hand more and attempting to pour with right.    Time  12    Period  Weeks    Status  On-going    Target Date  01/08/20            Plan - 11/19/19 1754    Clinical Impression Statement  Patient is making good progress with noted improvements in grip strength on the right hand, coordination and improved initiation of hand use for daily tasks. Patient is starting to initiate in use right hand more and has some self-awareness when he starts to use left hand, he will pause and often switch back to his right dominant affected hand for the remainder of the task. He continues to demonstrate right sided neglect and continues to grip items at times with too much pressure, but also forgets items in his hand and drops items frequently. His wife reports he knocked over a tea glass three different times in one day.  Will continue to work towards improved awareness as well as modifications and equipment for activities to perform at home with greater independence and safety. Patient was unable to pick up or complete nine hole peg test at evaluation however today, at the 10th visit, he was able to now pick up pegs with min to moderate difficulty for turning items and place into grid.  Able to complete the test in a little over six minutes.  Patient continues to benefit from skilled occupational therapy for improved right sided awareness, right hand function, right strength, coordination, improved and function to participate in self-care and ADL tasks with greater  independence.    OT Occupational Profile and History  Detailed Assessment- Review of Records and additional review of physical, cognitive, psychosocial history related to current functional performance    Occupational Profile and client history currently impacting functional performance  repeated falls, TBI, expressive aphasia, limited motion in right hand, lack of coordination from previous CVA    Occupational performance deficits (Please refer to evaluation for details):  ADL's;IADL's;Leisure    Body Structure / Function / Physical Skills  ADL;Dexterity;Flexibility;ROM;Strength;Vision;Balance;Coordination;FMC;IADL;Tone;Endurance;UE functional use;Mobility    Cognitive Skills  Attention;Safety Awareness;Problem Solve    Psychosocial Skills  Environmental  Adaptations;Routines and Behaviors;Habits;Interpersonal Interaction    Clinical Decision Making  Several treatment options, min-mod task modification necessary    Comorbidities Affecting Occupational Performance:  May have comorbidities impacting occupational performance    Modification or Assistance to Complete Evaluation   Min-Moderate modification of tasks or assist with assess necessary to complete eval    OT Frequency  2x / week    OT Duration  12 weeks    OT Treatment/Interventions  Self-care/ADL training;Visual/perceptual remediation/compensation;DME and/or AE instruction;Patient/family education;Moist Heat;Therapeutic exercise;Manual Therapy;Therapeutic activities;Neuromuscular education    Consulted and Agree with Plan of Care  Patient    Family Member Consulted  wife, [name]       Patient will benefit from skilled therapeutic intervention in order to improve the following deficits and impairments:   Body Structure / Function / Physical Skills: ADL, Dexterity, Flexibility, ROM, Strength, Vision, Balance, Coordination, FMC, IADL, Tone, Endurance, UE functional use, Mobility Cognitive Skills: Attention, Safety Awareness, Problem  Solve Psychosocial Skills: Environmental  Adaptations, Routines and Behaviors, Habits, Interpersonal Interaction  Visit Diagnosis: Muscle weakness (generalized)  Other lack of coordination  Neurologic neglect syndrome    Problem List Patient Active Problem List   Diagnosis Date Noted  . Sleep disturbance   . PAF (paroxysmal atrial fibrillation) ([place])   . Recurrent UTI   . Hypokalemia   . Hyponatremia   . Abnormal urine   . 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 Benign essential HTN   . Urinary retention   . History of gout   . Global aphasia   . SAH (subarachnoid hemorrhage) ([place])   . SDH (subdural hematoma) ([place])   . Lymphocytosis   . Subdural hemorrhage following injury [facility]) 05/12/2016   Shenelle Klas T Elmon Shader, OTR/L, CLT  Soul Hackman 11/20/2019, 5:57 PM  [place] MAIN [facility] SERVICES [address] [place], [place], [address] Phone: [phone]   Fax:  [phone]  Name: James [name] MRN: [id number] Date of Birth: 01-Aug-1940

## 2019-11-20 ENCOUNTER — Ambulatory Visit: Payer: PPO | Admitting: Occupational Therapy

## 2019-11-20 ENCOUNTER — Other Ambulatory Visit: Payer: Self-pay

## 2019-11-20 ENCOUNTER — Encounter: Payer: Self-pay | Admitting: Occupational Therapy

## 2019-11-20 DIAGNOSIS — R278 Other lack of coordination: Secondary | ICD-10-CM

## 2019-11-20 DIAGNOSIS — M6281 Muscle weakness (generalized): Secondary | ICD-10-CM

## 2019-11-20 DIAGNOSIS — R2681 Unsteadiness on feet: Secondary | ICD-10-CM

## 2019-11-20 DIAGNOSIS — R414 Neurologic neglect syndrome: Secondary | ICD-10-CM

## 2019-11-20 NOTE — Therapy (Signed)
[place] MAIN [facility] SERVICES [address] [place], [place], [address] Phone: [phone]   Fax:  [phone]  Occupational Therapy Treatment  Patient Details  Name: James [name] MRN: [id number] Date of Birth: [birth year] Referring Provider (OT): Joselyn Arrow   Encounter Date: 11/20/2019  OT End of Session - 11/20/19 1930    Visit Number  11    Number of Visits  24    Date for OT Re-Evaluation  01/08/20    OT Start Time  1100    OT Stop Time  1154    OT Time Calculation (min)  54 min    Activity Tolerance  Patient tolerated treatment well    Behavior During Therapy  [facility] for tasks assessed/performed       Past Medical History:  Diagnosis Date  . Alcohol abuse, in remission   . Atrial fibrillation ([place])   . Bilateral renal masses   . Closed right ankle fracture   . 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 Gait disorder   . Gout   . Hypertension   . Seizure disorder ([place])   . 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     Past Surgical History:  Procedure Laterality Date  . CRANIECTOMY FOR DEPRESSED SKULL FRACTURE  2009   with MRSA infection  . CRANIOTOMY     X 2  . HERNIA REPAIR     during childhood  . ORIF ANKLE FRACTURE Left 2012    There were no vitals filed for this visit.  Subjective Assessment - 11/20/19 1929    Subjective   Patient smiling on arrival, ready to work.    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  He suffered another fall in 2017.  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     Patient Stated Goals  Patient indicates he wants to be stronger, use his right hand for daily tasks, be as independent as possible.    Currently in Pain?  No/denies    Multiple Pain Sites  No      Therapeutic Activities: Patient seen  this date for use of PVC plumber pieces to create a variety of designs, copying from photos.  Pieces placed on right side to promote awareness.  Patient requires cues for release of objects on the right.  He did tend to initiate task with use of left UE, cues to use right UE during task and approach task from a bilateral UE perspective.  Patient tends to hold objects in the right hand tightly and often does not fully release until he is attempting to grab the next piece of the design.  Focused on attention to right hand during release before moving to next piece.  ADL: Patient seen for prehandwriting tasks with use of designs to trace with use of medium sized marker.  Cues for tripod grasping pattern on marker as well as attempts to stay within the lines of the designs.  Patient requested to have a few worksheets to perform at home, issued 3-4 sheets to do over the weekend.    Response to tx: Patient continues to make good progress, tends to use left hand more during tasks that require bilateral hand use.  Patient tends to focus on design and problem solving and loses  focus on use of right hand and requires cues to re-attend.  Once he is cued multiple times, he tends to shift his attention to the right.  Continues to hold objects in his right hand with delayed release of items, dropping items frequently on the floor.  Patient to work on Investment banker, operational at home for handwriting tasks.  Continue OT to work towards greater functional use of right dominant hand for necessary daily tasks.                    OT Education - 11/20/19 1930    Education provided  Yes    Education Details  progress, goals, prehension patterns with [facility]    Person(s) Educated  Patient;Spouse    Methods  Explanation;Demonstration;Verbal cues    Comprehension  Verbalized understanding;Returned demonstration;Verbal cues required          OT Long Term Goals - 11/18/19 1110      OT LONG TERM GOAL #1   Title  Patient  will demonstrate use of utensil in right hand for self feeding with modified independence with little to no spillage.     Baseline  unable at eval to hold utensil, 10th visit holding utensil, some feeding    Time  12    Period  Weeks    Status  On-going    Target Date  01/08/20      OT LONG TERM GOAL #2   Title  Pt will increase hand strength in right by 5# to be able to cut food with modified independence.    Baseline  unable to cut meat at evaluation, 10th visit starting to work on cutting meat    Time  12    Period  Weeks    Status  On-going    Target Date  01/08/20      OT [place] #3   Title  Patient will demonstrate increased awareness and strategies to attend to right side to hold items without dropping items.    Baseline  difficulty with detecting items in right hand at eval, forgets items are in hand, 10th visit improved but still working on    Time  12    Period  Weeks    Status  On-going    Target Date  01/08/20      OT LONG TERM GOAL #4   Title  Patient will demonstrate independence in home exercise program for ROM, strength and coordination of right hand.    Baseline  eval-patient has not been performing exercises from last episode of care and demonstrates decline, 10th visit doing exercises at home and continue to modify program    Time  6    Period  Weeks    Status  On-going    Target Date  11/25/19      OT LONG TERM GOAL #6   Title  Patient will demonstrate pouring a drink with the right hand with attention to right side and not knocking the cup over and spilling.      Baseline  unable at eval, 10th visit starting to use right hand more and attempting to pour with right.    Time  12    Period  Weeks    Status  On-going    Target Date  01/08/20            Plan - 11/20/19 1931    Clinical Impression Statement  Patient continues to make good progress, tends to use left hand more  during tasks that require bilateral hand use.  Patient tends to focus on  design and problem solving and loses focus on use of right hand and requires cues to re-attend.  Once he is cued multiple times, he tends to shift his attention to the right.  Continues to hold objects in his right hand with delayed release of items, dropping items frequently on the floor.  Patient to work on Investment banker, operational at home for handwriting tasks.  Continue OT to work towards greater functional use of right dominant hand for necessary daily tasks.    OT Occupational Profile and History  Detailed Assessment- Review of Records and additional review of physical, cognitive, psychosocial history related to current functional performance    Occupational Profile and client history currently impacting functional performance  repeated falls, TBI, expressive aphasia, limited motion in right hand, lack of coordination from previous CVA    Occupational performance deficits (Please refer to evaluation for details):  ADL's;IADL's;Leisure    Body Structure / Function / Physical Skills  ADL;Dexterity;Flexibility;ROM;Strength;Vision;Balance;Coordination;FMC;IADL;Tone;Endurance;UE functional use;Mobility    Cognitive Skills  Attention;Safety Awareness;Problem Solve    Psychosocial Skills  Environmental  Adaptations;Routines and Behaviors;Habits;Interpersonal Interaction    Rehab Potential  Good    Clinical Decision Making  Several treatment options, min-mod task modification necessary    Comorbidities Affecting Occupational Performance:  May have comorbidities impacting occupational performance    Modification or Assistance to Complete Evaluation   Min-Moderate modification of tasks or assist with assess necessary to complete eval    OT Frequency  2x / week    OT Duration  12 weeks    OT Treatment/Interventions  Self-care/ADL training;Visual/perceptual remediation/compensation;DME and/or AE instruction;Patient/family education;Moist Heat;Therapeutic exercise;Manual Therapy;Therapeutic activities;Neuromuscular  education    Consulted and Agree with Plan of Care  Patient       Patient will benefit from skilled therapeutic intervention in order to improve the following deficits and impairments:   Body Structure / Function / Physical Skills: ADL, Dexterity, Flexibility, ROM, Strength, Vision, Balance, Coordination, FMC, IADL, Tone, Endurance, UE functional use, Mobility Cognitive Skills: Attention, Safety Awareness, Problem Solve Psychosocial Skills: Environmental  Adaptations, Routines and Behaviors, Habits, Interpersonal Interaction   Visit Diagnosis: Muscle weakness (generalized)  Other lack of coordination  Neurologic neglect syndrome  Unsteadiness on feet    Problem List Patient Active Problem List   Diagnosis Date Noted  . Sleep disturbance   . PAF (paroxysmal atrial fibrillation) ([place])   . Recurrent UTI   . Hypokalemia   . Hyponatremia   . Abnormal urine   . 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 Benign essential HTN   . Urinary retention   . History of gout   . Global aphasia   . SAH (subarachnoid hemorrhage) ([place])   . SDH (subdural hematoma) ([place])   . Lymphocytosis   . Subdural hemorrhage following injury [facility]) 05/12/2016   Toniqua Melamed T Kyrie Fludd, OTR/L, CLT  Naijah Lacek 11/20/2019, 7:59 PM  [place] MAIN [facility] SERVICES [address] [place], [place], [address] Phone: [phone]   Fax:  [phone]  Name: James [name] MRN: [id number] Date of Birth: 1940/08/31

## 2019-11-25 ENCOUNTER — Encounter: Payer: Self-pay | Admitting: Occupational Therapy

## 2019-11-25 ENCOUNTER — Ambulatory Visit: Payer: PPO | Attending: Neurology | Admitting: Occupational Therapy

## 2019-11-25 ENCOUNTER — Other Ambulatory Visit: Payer: Self-pay

## 2019-11-25 DIAGNOSIS — R2681 Unsteadiness on feet: Secondary | ICD-10-CM | POA: Insufficient documentation

## 2019-11-25 DIAGNOSIS — M6281 Muscle weakness (generalized): Secondary | ICD-10-CM | POA: Diagnosis not present

## 2019-11-25 DIAGNOSIS — R414 Neurologic neglect syndrome: Secondary | ICD-10-CM | POA: Diagnosis not present

## 2019-11-25 DIAGNOSIS — R278 Other lack of coordination: Secondary | ICD-10-CM | POA: Diagnosis not present

## 2019-11-25 NOTE — Therapy (Signed)
[place] MAIN [facility] SERVICES [address] [place], [place], [address] Phone: [phone]   Fax:  [phone]  Occupational Therapy Treatment  Patient Details  Name: James [name] MRN: [id number] Date of Birth: June 18, 1941 Referring Provider (OT): Joselyn Arrow   Encounter Date: 11/25/2019  OT End of Session - 11/27/19 1429    Visit Number  12    Number of Visits  24    Date for OT Re-Evaluation  01/08/20    OT Start Time  1057    OT Stop Time  1145    OT Time Calculation (min)  48 min    Activity Tolerance  Patient tolerated treatment well    Behavior During Therapy  [facility] for tasks assessed/performed       Past Medical History:  Diagnosis Date  . Alcohol abuse, in remission   . Atrial fibrillation ([place])   . Bilateral renal masses   . Closed right ankle fracture   . 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 Gait disorder   . Gout   . Hypertension   . Seizure disorder ([place])   . 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     Past Surgical History:  Procedure Laterality Date  . CRANIECTOMY FOR DEPRESSED SKULL FRACTURE  2009   with MRSA infection  . CRANIOTOMY     X 2  . HERNIA REPAIR     during childhood  . ORIF ANKLE FRACTURE Left 2012    There were no vitals filed for this visit.  Subjective Assessment - 11/27/19 1427    Subjective   Patient reports he did his homework but forgot to bring it in.    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  He suffered another fall in 2017.  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     Patient Stated Goals  Patient indicates he wants to be stronger, use his right hand for daily tasks, be as independent as possible.    Currently in Pain?  No/denies    Multiple Pain Sites  No          Patient  seen for thumb ROM/stretching with manual techniques from therapist followed by AAROM in all directions prior to other activities.   Patient seen for manipulation of small and medium washers, presented in a magnetic bowl however he could not pick up items from the bowl, required therapist to hand them to him while being cued for prehension pattern.  Once holding item, he worked towards placing washers on hooks in elevated plan of motion, requiring increased reaching patterns. Continued difficulty with removing items from hooks, moderate cues for prehension patterns, dropping items frequently, washers were the size of a dime and quarter.    Handwriting with some pre handwriting strokes with focus on up and down stroke patterns with right hand and use of pen with pen gripper.  Adjustments to pen gripper to move further down shaft of pen.  Cues for tripod grasping pattern.   Response to tx:  Patient with improved ROM thumb since incorporating thumb ROM, increase in thumb ABd by 10 degrees.  Patient demonstrates difficulty with prehension patterns to pick up nickel and quarter sized washers from magnetic bowl, able to grasp if handed item.  Difficulty at times  removing with attempts to achieve index and thumb tip to tip pattern. Cues for tripod grasping patterns on pen, patient has difficulty knowing when to adjust pen grip.                   OT Education - 11/27/19 1428    Education provided  Yes    Education Details  progress, goals, prehension patterns with [facility]    Person(s) Educated  Patient;Spouse    Methods  Explanation;Demonstration;Verbal cues    Comprehension  Verbalized understanding;Returned demonstration;Verbal cues required          OT Long Term Goals - 11/18/19 1110      OT LONG TERM GOAL #1   Title  Patient will demonstrate use of utensil in right hand for self feeding with modified independence with little to no spillage.     Baseline  unable at eval to hold  utensil, 10th visit holding utensil, some feeding    Time  12    Period  Weeks    Status  On-going    Target Date  01/08/20      OT LONG TERM GOAL #2   Title  Pt will increase hand strength in right by 5# to be able to cut food with modified independence.    Baseline  unable to cut meat at evaluation, 10th visit starting to work on cutting meat    Time  12    Period  Weeks    Status  On-going    Target Date  01/08/20      OT [place] #3   Title  Patient will demonstrate increased awareness and strategies to attend to right side to hold items without dropping items.    Baseline  difficulty with detecting items in right hand at eval, forgets items are in hand, 10th visit improved but still working on    Time  12    Period  Weeks    Status  On-going    Target Date  01/08/20      OT LONG TERM GOAL #4   Title  Patient will demonstrate independence in home exercise program for ROM, strength and coordination of right hand.    Baseline  eval-patient has not been performing exercises from last episode of care and demonstrates decline, 10th visit doing exercises at home and continue to modify program    Time  6    Period  Weeks    Status  On-going    Target Date  11/25/19      OT LONG TERM GOAL #6   Title  Patient will demonstrate pouring a drink with the right hand with attention to right side and not knocking the cup over and spilling.      Baseline  unable at eval, 10th visit starting to use right hand more and attempting to pour with right.    Time  12    Period  Weeks    Status  On-going    Target Date  01/08/20            Plan - 11/27/19 1430    Clinical Impression Statement  Patient with improved ROM thumb since incorporating thumb ROM, increase in thumb ABd by 10 degrees.  Patient demonstrates difficulty with prehension patterns to pick up nickel and quarter sized washers from magnetic bowl, able to grasp if handed item.  Difficulty at times removing with attempts to  achieve index and thumb tip to tip pattern. Cues for tripod  grasping patterns on pen, patient has difficulty knowing when to adjust pen grip.    OT Occupational Profile and History  Detailed Assessment- Review of Records and additional review of physical, cognitive, psychosocial history related to current functional performance    Occupational Profile and client history currently impacting functional performance  repeated falls, TBI, expressive aphasia, limited motion in right hand, lack of coordination from previous CVA    Occupational performance deficits (Please refer to evaluation for details):  ADL's;IADL's;Leisure    Body Structure / Function / Physical Skills  ADL;Dexterity;Flexibility;ROM;Strength;Vision;Balance;Coordination;FMC;IADL;Tone;Endurance;UE functional use;Mobility    Psychosocial Skills  Environmental  Adaptations;Routines and Behaviors;Habits;Interpersonal Interaction    Clinical Decision Making  Several treatment options, min-mod task modification necessary    Comorbidities Affecting Occupational Performance:  May have comorbidities impacting occupational performance    Modification or Assistance to Complete Evaluation   Min-Moderate modification of tasks or assist with assess necessary to complete eval    OT Frequency  2x / week    OT Duration  12 weeks    OT Treatment/Interventions  Self-care/ADL training;Visual/perceptual remediation/compensation;DME and/or AE instruction;Patient/family education;Moist Heat;Therapeutic exercise;Manual Therapy;Therapeutic activities;Neuromuscular education    Consulted and Agree with Plan of Care  Patient       Patient will benefit from skilled therapeutic intervention in order to improve the following deficits and impairments:   Body Structure / Function / Physical Skills: ADL, Dexterity, Flexibility, ROM, Strength, Vision, Balance, Coordination, FMC, IADL, Tone, Endurance, UE functional use, Mobility   Psychosocial Skills: Environmental   Adaptations, Routines and Behaviors, Habits, Interpersonal Interaction   Visit Diagnosis: Muscle weakness (generalized)  Other lack of coordination  Neurologic neglect syndrome  Unsteadiness on feet    Problem List Patient Active Problem List   Diagnosis Date Noted  . Sleep disturbance   . PAF (paroxysmal atrial fibrillation) ([place])   . Recurrent UTI   . Hypokalemia   . Hyponatremia   . Abnormal urine   . 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 Benign essential HTN   . Urinary retention   . History of gout   . Global aphasia   . SAH (subarachnoid hemorrhage) ([place])   . SDH (subdural hematoma) ([place])   . Lymphocytosis   . Subdural hemorrhage following injury ([place]) 05/12/2016   Berkleigh Beckles T Vahan [place], OTR/L, CLT  Jahnaya Branscome 11/27/2019, 2:32 PM  [place] MAIN [facility] SERVICES [address] [place], [place], [address] Phone: [phone]   Fax:  [phone]  Name: Trentin Sanda MRN: [id number] Date of Birth: Nov 25, 1940

## 2019-11-27 ENCOUNTER — Other Ambulatory Visit: Payer: Self-pay

## 2019-11-27 ENCOUNTER — Encounter: Payer: Self-pay | Admitting: Occupational Therapy

## 2019-11-27 ENCOUNTER — Ambulatory Visit: Payer: PPO | Admitting: Occupational Therapy

## 2019-11-27 DIAGNOSIS — M6281 Muscle weakness (generalized): Secondary | ICD-10-CM | POA: Diagnosis not present

## 2019-11-27 DIAGNOSIS — R278 Other lack of coordination: Secondary | ICD-10-CM

## 2019-11-27 DIAGNOSIS — R414 Neurologic neglect syndrome: Secondary | ICD-10-CM

## 2019-11-27 DIAGNOSIS — R2681 Unsteadiness on feet: Secondary | ICD-10-CM

## 2019-11-27 NOTE — Therapy (Signed)
[place] MAIN [facility] SERVICES [address] [place], [place], [address] Phone: [phone]   Fax:  [phone]  Occupational Therapy Treatment  Patient Details  Name: James [name] MRN: [id number] Date of Birth: Nov 03, 1940 Referring Provider (OT): Joselyn Arrow   Encounter Date: 11/27/2019  OT End of Session - 11/27/19 2040    Visit Number  13    Number of Visits  24    Date for OT Re-Evaluation  01/08/20    OT Start Time  1055    OT Stop Time  1142    OT Time Calculation (min)  47 min    Activity Tolerance  Patient tolerated treatment well    Behavior During Therapy  [facility] for tasks assessed/performed       Past Medical History:  Diagnosis Date  . Alcohol abuse, in remission   . Atrial fibrillation ([place])   . Bilateral renal masses   . Closed right ankle fracture   . 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 Gait disorder   . Gout   . Hypertension   . Seizure disorder ([place])   . 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     Past Surgical History:  Procedure Laterality Date  . CRANIECTOMY FOR DEPRESSED SKULL FRACTURE  2009   with MRSA infection  . CRANIOTOMY     X 2  . HERNIA REPAIR     during childhood  . ORIF ANKLE FRACTURE Left 2012    There were no vitals filed for this visit.  Subjective Assessment - 11/27/19 2039    Subjective   Patient indicating he has continued to work on handwriting skills at home, doing exercises for thumb ROM.  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  He suffered another fall in 2017.  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     Patient Stated Goals  Patient indicates he wants to be stronger, use his right hand for daily tasks, be as independent as possible.     Currently in Pain?  No/denies    Multiple Pain Sites  No       No pain reported this date  Patient seen for focus on Thumb and finger prehension patterns to pick up and manipulate small ball pegs with right hand to place into board.   Board placed on wedge to encourage increased reach while also working on facilitation of fine motor control and dexterity.  Cues for prehension patterns and to attend to right side visually. Continued coordination skills with use of large dry erase marker for Writing on dry erase board in standing.  Patient demonstrates difficulty with tripod grasp in standing with marker and tends to hold marker further from the base of the pen, dropping pen often and making several marks on his shirt this date.  Performed in standing, cues to attend to right side of the board at times.  Manipulation of small Glass beads, some face up and others flat side down from tabletop surface with cues for tip to tip patterns with index and thumb.    Response to tx: Patient's focus this date was on manipulation skills to pick up objects, turn if needed to place into grid, or placing  items in small bin.  Continued cues for tip to tip prehension patterns as well as graded pressure to hold items with secure grasp but not too tight.  Patient demonstrated difficulty with handwriting in standing with a different plane of motion and without stabilizing wrist and hand on table.  Continue to work towards goals in plan of care to maximize safety and independence in daily tasks.                     OT Education - 11/27/19 2040    Education provided  Yes    Education Details  tip to tip prehension patterns    Person(s) Educated  Patient;Spouse    Methods  Explanation;Demonstration;Verbal cues    Comprehension  Verbalized understanding;Returned demonstration;Verbal cues required          OT Long Term Goals - 11/18/19 1110      OT LONG TERM GOAL #1   Title  Patient will  demonstrate use of utensil in right hand for self feeding with modified independence with little to no spillage.     Baseline  unable at eval to hold utensil, 10th visit holding utensil, some feeding    Time  12    Period  Weeks    Status  On-going    Target Date  01/08/20      OT LONG TERM GOAL #2   Title  Pt will increase hand strength in right by 5# to be able to cut food with modified independence.    Baseline  unable to cut meat at evaluation, 10th visit starting to work on cutting meat    Time  12    Period  Weeks    Status  On-going    Target Date  01/08/20      OT [place] #3   Title  Patient will demonstrate increased awareness and strategies to attend to right side to hold items without dropping items.    Baseline  difficulty with detecting items in right hand at eval, forgets items are in hand, 10th visit improved but still working on    Time  12    Period  Weeks    Status  On-going    Target Date  01/08/20      OT LONG TERM GOAL #4   Title  Patient will demonstrate independence in home exercise program for ROM, strength and coordination of right hand.    Baseline  eval-patient has not been performing exercises from last episode of care and demonstrates decline, 10th visit doing exercises at home and continue to modify program    Time  6    Period  Weeks    Status  On-going    Target Date  11/25/19      OT LONG TERM GOAL #6   Title  Patient will demonstrate pouring a drink with the right hand with attention to right side and not knocking the cup over and spilling.      Baseline  unable at eval, 10th visit starting to use right hand more and attempting to pour with right.    Time  12    Period  Weeks    Status  On-going    Target Date  01/08/20            Plan - 11/27/19 2041    Clinical Impression Statement  Patient's focus this date was on manipulation skills to pick up objects, turn if needed to place into grid,  or placing items in small bin.   Continued cues for tip to tip prehension patterns as well as graded pressure to hold items with secure grasp but not too tight.  Patient demonstrated difficulty with handwriting in standing with a different plane of motion and without stabilizing wrist and hand on table.  Continue to work towards goals in plan of care to maximize safety and independence in daily tasks.    OT Occupational Profile and History  Detailed Assessment- Review of Records and additional review of physical, cognitive, psychosocial history related to current functional performance    Occupational Profile and client history currently impacting functional performance  repeated falls, TBI, expressive aphasia, limited motion in right hand, lack of coordination from previous CVA    Occupational performance deficits (Please refer to evaluation for details):  ADL's;IADL's;Leisure    Body Structure / Function / Physical Skills  ADL;Dexterity;Flexibility;ROM;Strength;Vision;Balance;Coordination;FMC;IADL;Tone;Endurance;UE functional use;Mobility    Cognitive Skills  Attention;Safety Awareness;Problem Solve    Psychosocial Skills  Environmental  Adaptations;Routines and Behaviors;Habits;Interpersonal Interaction    Rehab Potential  Good    Clinical Decision Making  Several treatment options, min-mod task modification necessary    Comorbidities Affecting Occupational Performance:  May have comorbidities impacting occupational performance    Modification or Assistance to Complete Evaluation   Min-Moderate modification of tasks or assist with assess necessary to complete eval    OT Frequency  2x / week    OT Duration  12 weeks    OT Treatment/Interventions  Self-care/ADL training;Visual/perceptual remediation/compensation;DME and/or AE instruction;Patient/family education;Moist Heat;Therapeutic exercise;Manual Therapy;Therapeutic activities;Neuromuscular education    Consulted and Agree with Plan of Care  Patient    Family Member Consulted   wife, [name]       Patient will benefit from skilled therapeutic intervention in order to improve the following deficits and impairments:   Body Structure / Function / Physical Skills: ADL, Dexterity, Flexibility, ROM, Strength, Vision, Balance, Coordination, FMC, IADL, Tone, Endurance, UE functional use, Mobility Cognitive Skills: Attention, Safety Awareness, Problem Solve Psychosocial Skills: Environmental  Adaptations, Routines and Behaviors, Habits, Interpersonal Interaction   Visit Diagnosis: Muscle weakness (generalized)  Other lack of coordination  Neurologic neglect syndrome  Unsteadiness on feet    Problem List Patient Active Problem List   Diagnosis Date Noted  . Sleep disturbance   . PAF (paroxysmal atrial fibrillation) ([place])   . Recurrent UTI   . Hypokalemia   . Hyponatremia   . Abnormal urine   . 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 Benign essential HTN   . Urinary retention   . History of gout   . Global aphasia   . SAH (subarachnoid hemorrhage) ([place])   . SDH (subdural hematoma) ([place])   . Lymphocytosis   . Subdural hemorrhage following injury [facility]) 05/12/2016   Monicka Cyran T Suraiya Dickerson, OTR/L, CLT  Jaelon Gatley 11/28/2019, 9:26 PM  [place] MAIN [facility] SERVICES [address] [place], [place], [address] Phone: [phone]   Fax:  [phone]  Name: Kacen Fossen MRN: [id number] Date of Birth: 1940-11-21

## 2019-12-02 ENCOUNTER — Encounter: Payer: Self-pay | Admitting: Occupational Therapy

## 2019-12-02 ENCOUNTER — Other Ambulatory Visit: Payer: Self-pay

## 2019-12-02 ENCOUNTER — Ambulatory Visit: Payer: PPO | Admitting: Occupational Therapy

## 2019-12-02 DIAGNOSIS — M6281 Muscle weakness (generalized): Secondary | ICD-10-CM | POA: Diagnosis not present

## 2019-12-02 DIAGNOSIS — R414 Neurologic neglect syndrome: Secondary | ICD-10-CM

## 2019-12-02 DIAGNOSIS — R278 Other lack of coordination: Secondary | ICD-10-CM

## 2019-12-04 ENCOUNTER — Ambulatory Visit: Payer: PPO | Admitting: Occupational Therapy

## 2019-12-04 ENCOUNTER — Other Ambulatory Visit: Payer: Self-pay

## 2019-12-04 DIAGNOSIS — M6281 Muscle weakness (generalized): Secondary | ICD-10-CM | POA: Diagnosis not present

## 2019-12-04 DIAGNOSIS — R414 Neurologic neglect syndrome: Secondary | ICD-10-CM

## 2019-12-04 DIAGNOSIS — R278 Other lack of coordination: Secondary | ICD-10-CM

## 2019-12-04 DIAGNOSIS — R2681 Unsteadiness on feet: Secondary | ICD-10-CM

## 2019-12-06 ENCOUNTER — Encounter: Payer: Self-pay | Admitting: Occupational Therapy

## 2019-12-06 NOTE — Therapy (Signed)
[place] MAIN [facility] SERVICES [address] [place], [place], [address] Phone: [phone]   Fax:  [phone]  Occupational Therapy Treatment  Patient Details  Name: James [name] MRN: [id number] Date of Birth: 1940-09-20 Referring Provider (OT): Joselyn Arrow   Encounter Date: 12/02/2019  OT End of Session - 12/05/19 0559    Visit Number  14    Number of Visits  24    Date for OT Re-Evaluation  01/08/20    OT Start Time  1059    OT Stop Time  1147    OT Time Calculation (min)  48 min    Activity Tolerance  Patient tolerated treatment well    Behavior During Therapy  [facility] for tasks assessed/performed       Past Medical History:  Diagnosis Date  . Alcohol abuse, in remission   . Atrial fibrillation ([place])   . Bilateral renal masses   . Closed right ankle fracture   . 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 Gait disorder   . Gout   . Hypertension   . Seizure disorder ([place])   . 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     Past Surgical History:  Procedure Laterality Date  . CRANIECTOMY FOR DEPRESSED SKULL FRACTURE  2009   with MRSA infection  . CRANIOTOMY     X 2  . HERNIA REPAIR     during childhood  . ORIF ANKLE FRACTURE Left 2012    There were no vitals filed for this visit.  Subjective Assessment - 12/05/19 0558    Subjective   Patient brought in some homework from last week for prewriting skills.    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  He suffered another fall in 2017.  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     Patient Stated Goals  Patient indicates he wants to be stronger, use his right hand for daily tasks, be as independent as possible.    Currently in Pain?  No/denies    Multiple Pain Sites  No         Patient seen this date with focus on bilateral hand use to utilize tools, nuts and bolts with variety of sizes.  Patient performing tasks in sitting, use of tool bench with nuts and bolts which required hex tool.  Patient has difficulty with using right dominant affected hand under the tool bench since his vision is occluded.  Patient worked best with left hand under the bench to stabilize the nut/bolt while the right hand was using the hex tool.  Cues for proper grasping patterns on tool.  When nuts are on the top of the bench, patient is able to engage with right hand using vision to assist with task but has difficulty with manipulation skills to turn nuts on bolts at times, requires cues for prehension patterns, grasping and attempts to turn.    Response to tx: Although patient is making good progress towards self initiating movement of the right upper extremity when performing tasks, he occasionally requires cues especially when he is performing a more complex task that requires greater attention to detail.  he demonstrates maximal difficulty when performing tasks with right hand when vision is occluded. With use of tool bench he performs best  when using left hand under the bench while using right hand to stabilize the nuts and bolts on the top.  Patient can become easily frustrated with select tasks and requires graded activity and structure to promote independence. Continue to work towards goals and plan of care to maximize safety and independence and necessary daily tasks.                    OT Education - 12/06/19 0558    Education provided  Yes    Education Details  use of right hand to hold tools, variety of grasping patterns.    Person(s) Educated  Patient    Methods  Explanation;Demonstration;Verbal cues    Comprehension  Verbalized understanding;Returned demonstration;Verbal cues required          OT Long Term Goals - 11/18/19 1110      OT LONG TERM GOAL #1    Title  Patient will demonstrate use of utensil in right hand for self feeding with modified independence with little to no spillage.     Baseline  unable at eval to hold utensil, 10th visit holding utensil, some feeding    Time  12    Period  Weeks    Status  On-going    Target Date  01/08/20      OT LONG TERM GOAL #2   Title  Pt will increase hand strength in right by 5# to be able to cut food with modified independence.    Baseline  unable to cut meat at evaluation, 10th visit starting to work on cutting meat    Time  12    Period  Weeks    Status  On-going    Target Date  01/08/20      OT [place] #3   Title  Patient will demonstrate increased awareness and strategies to attend to right side to hold items without dropping items.    Baseline  difficulty with detecting items in right hand at eval, forgets items are in hand, 10th visit improved but still working on    Time  12    Period  Weeks    Status  On-going    Target Date  01/08/20      OT LONG TERM GOAL #4   Title  Patient will demonstrate independence in home exercise program for ROM, strength and coordination of right hand.    Baseline  eval-patient has not been performing exercises from last episode of care and demonstrates decline, 10th visit doing exercises at home and continue to modify program    Time  6    Period  Weeks    Status  On-going    Target Date  11/25/19      OT LONG TERM GOAL #6   Title  Patient will demonstrate pouring a drink with the right hand with attention to right side and not knocking the cup over and spilling.      Baseline  unable at eval, 10th visit starting to use right hand more and attempting to pour with right.    Time  12    Period  Weeks    Status  On-going    Target Date  01/08/20            Plan - 12/06/19 0559    Clinical Impression Statement  Although patient is making good progress towards self initiating movement of the right upper extremity when performing tasks,  he occasionally requires cues especially when he  is performing a more complex task that requires greater attention to detail.  he demonstrates maximal difficulty when performing tasks with right hand when vision is occluded. With use of tool bench he performs best when using left hand under the bench while using right hand to stabilize the nuts and bolts on the top.  Patient can become easily frustrated with select tasks and requires graded activity and structure to promote independence. Continue to work towards goals and plan of care to maximize safety and independence and necessary daily tasks.    OT Occupational Profile and History  Detailed Assessment- Review of Records and additional review of physical, cognitive, psychosocial history related to current functional performance    Occupational Profile and client history currently impacting functional performance  repeated falls, TBI, expressive aphasia, limited motion in right hand, lack of coordination from previous CVA    Occupational performance deficits (Please refer to evaluation for details):  ADL's;IADL's;Leisure    Body Structure / Function / Physical Skills  ADL;Dexterity;Flexibility;ROM;Strength;Vision;Balance;Coordination;FMC;IADL;Tone;Endurance;UE functional use;Mobility    Cognitive Skills  Attention;Safety Awareness;Problem Solve    Psychosocial Skills  Environmental  Adaptations;Routines and Behaviors;Habits;Interpersonal Interaction    Rehab Potential  Good    Clinical Decision Making  Several treatment options, min-mod task modification necessary    Comorbidities Affecting Occupational Performance:  May have comorbidities impacting occupational performance    Modification or Assistance to Complete Evaluation   Min-Moderate modification of tasks or assist with assess necessary to complete eval    OT Frequency  2x / week    OT Duration  12 weeks    OT Treatment/Interventions  Self-care/ADL training;Visual/perceptual  remediation/compensation;DME and/or AE instruction;Patient/family education;Moist Heat;Therapeutic exercise;Manual Therapy;Therapeutic activities;Neuromuscular education    Consulted and Agree with Plan of Care  Patient    Family Member Consulted  wife, [name]       Patient will benefit from skilled therapeutic intervention in order to improve the following deficits and impairments:   Body Structure / Function / Physical Skills: ADL, Dexterity, Flexibility, ROM, Strength, Vision, Balance, Coordination, FMC, IADL, Tone, Endurance, UE functional use, Mobility Cognitive Skills: Attention, Safety Awareness, Problem Solve Psychosocial Skills: Environmental  Adaptations, Routines and Behaviors, Habits, Interpersonal Interaction   Visit Diagnosis: Muscle weakness (generalized)  Other lack of coordination  Neurologic neglect syndrome    Problem List Patient Active Problem List   Diagnosis Date Noted  . Sleep disturbance   . PAF (paroxysmal atrial fibrillation) ([place])   . Recurrent UTI   . Hypokalemia   . Hyponatremia   . Abnormal urine   . 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 Benign essential HTN   . Urinary retention   . History of gout   . Global aphasia   . SAH (subarachnoid hemorrhage) ([place])   . SDH (subdural hematoma) ([place])   . Lymphocytosis   . Subdural hemorrhage following injury ([place]) 05/12/2016   Elliana Bal T Shaundrea Carrigg, OTR/L, CLT  Yehuda Printup 12/06/2019, 9:20 AM  [place] [address] [place], [place], [address] Phone: [phone]   Fax:  [phone]  Name: Vernie Kosak MRN: [id number] Date of Birth: 1941-06-02

## 2019-12-06 NOTE — Therapy (Signed)
[place] MAIN [facility] SERVICES [address] [place], [place], [address] Phone: [phone]   Fax:  [phone]  Occupational Therapy Treatment  Patient Details  Name: James [name] MRN: [id number] Date of Birth: 1940/08/22 Referring Provider (OT): Joselyn Arrow   Encounter Date: 12/04/2019  OT End of Session - 12/06/19 0956    Visit Number  15    Number of Visits  24    Date for OT Re-Evaluation  01/08/20    OT Start Time  1107    OT Stop Time  1156    OT Time Calculation (min)  49 min    Activity Tolerance  Patient tolerated treatment well    Behavior During Therapy  [facility] for tasks assessed/performed       Past Medical History:  Diagnosis Date  . Alcohol abuse, in remission   . Atrial fibrillation ([place])   . Bilateral renal masses   . Closed right ankle fracture   . 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 Gait disorder   . Gout   . Hypertension   . Seizure disorder ([place])   . 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     Past Surgical History:  Procedure Laterality Date  . CRANIECTOMY FOR DEPRESSED SKULL FRACTURE  2009   with MRSA infection  . CRANIOTOMY     X 2  . HERNIA REPAIR     during childhood  . ORIF ANKLE FRACTURE Left 2012    There were no vitals filed for this visit.  Subjective Assessment - 12/06/19 0955    Subjective   Patient smiling on arrival and ready to get started with therapy.  No complaints.    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  He suffered another fall in 2017.  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     Patient Stated Goals  Patient indicates he wants to be stronger, use his right hand for daily tasks, be as independent as possible.    Currently in Pain?  No/denies    Multiple Pain Sites  No       Patient seen this date for focus on handwriting skills for his name, multiple trials completed with a variety of pens with built up surfaces, tend to perform best with gel pen.  Legibility remains poor but is showing progress with select samples.  Cues at times for grip on pen and when to readjust when he loses the grip.    Patient also seen for fine motor coordination skills in combination with reaching patterns from seated position.  Easel placed in elevated plane of motion to encourage greater reach.  Patient working on obtaining small and medium sized washers from magnetic bowl and placing onto hooks in elevated position on easel.  Patient dropping items frequently but able to pick up items from the floor using his left hand.  Improved ability to remove washers from magnetic bowl this session.  Unable to pick up washers from the table directly but is able to slide to the edge and with cues for prehension patterns he is able to pick up.        Response to tx: Patient continuing to work on Engineer, production with emphasis on type of pen, tripod grip and the need to  make readjustments to pen as he loses his grasp.  Patient tends to be more focus on the outcome rather than a secure grip but he responds well to cues and occasional assistance to reposition pen correctly in his right hand.  Occasional cues this date for graded pressure when holding onto items to loosed his grip.  Patient continues to have difficulty with picking up flat objects from a flat surface but responds well if cued to bring item to edge of table and use a tip to tip pattern to pick up.  Patient able to obtain some of the washers today from the magnetic bowl without assistance from therapist which he couldn't perform 2 weeks ago.  Continue to work towards goals in plan of care to improve right hand function and right sided awareness for improved safety and independence in daily tasks.Patient with decreased balance when ambulating in  the hallway, discussed the need for PT evaluation however patient has not wanted to pursue.  We will discuss this again next session.             OT Education - 12/06/19 [phone]    Education provided  Yes    Education Details  fine motor coordination with prehension patterns, handwriting    Person(s) Educated  Patient    Methods  Explanation;Demonstration;Verbal cues    Comprehension  Verbalized understanding;Returned demonstration;Verbal cues required          OT Long Term Goals - 11/18/19 1110      OT LONG TERM GOAL #1   Title  Patient will demonstrate use of utensil in right hand for self feeding with modified independence with little to no spillage.     Baseline  unable at eval to hold utensil, 10th visit holding utensil, some feeding    Time  12    Period  Weeks    Status  On-going    Target Date  01/08/20      OT LONG TERM GOAL #2   Title  Pt will increase hand strength in right by 5# to be able to cut food with modified independence.    Baseline  unable to cut meat at evaluation, 10th visit starting to work on cutting meat    Time  12    Period  Weeks    Status  On-going    Target Date  01/08/20      OT [place] #3   Title  Patient will demonstrate increased awareness and strategies to attend to right side to hold items without dropping items.    Baseline  difficulty with detecting items in right hand at eval, forgets items are in hand, 10th visit improved but still working on    Time  12    Period  Weeks    Status  On-going    Target Date  01/08/20      OT LONG TERM GOAL #4   Title  Patient will demonstrate independence in home exercise program for ROM, strength and coordination of right hand.    Baseline  eval-patient has not been performing exercises from last episode of care and demonstrates decline, 10th visit doing exercises at home and continue to modify program    Time  6    Period  Weeks    Status  On-going    Target Date  11/25/19      OT  LONG TERM GOAL #6   Title  Patient will demonstrate pouring a drink with the right hand with  attention to right side and not knocking the cup over and spilling.      Baseline  unable at eval, 10th visit starting to use right hand more and attempting to pour with right.    Time  12    Period  Weeks    Status  On-going    Target Date  01/08/20            Plan - 12/06/19 0957    Clinical Impression Statement  Patient continuing to work on handwriting skills with emphasis on type of pen, tripod grip and the need to make readjustments to pen as he loses his grasp.  Patient tends to be more focus on the outcome rather than a secure grip but he responds well to cues and occasional assistance to reposition pen correctly in his right hand.  Occasional cues this date for graded pressure when holding onto items to loosed his grip.  Patient continues to have difficulty with picking up flat objects from a flat surface but responds well if cued to bring item to edge of table and use a tip to tip pattern to pick up.  Patient able to obtain some of the washers today from the magnetic bowl without assistance from therapist which he couldn't perform 2 weeks ago.  Continue to work towards goals in plan of care to improve right hand function and right sided awareness for improved safety and independence in daily tasks.Patient with decreased balance when ambulating in the hallway, discussed the need for PT evaluation however patient has not wanted to pursue.  We will discuss this again next session.    OT Occupational Profile and History  Detailed Assessment- Review of Records and additional review of physical, cognitive, psychosocial history related to current functional performance    Occupational Profile and client history currently impacting functional performance  repeated falls, TBI, expressive aphasia, limited motion in right hand, lack of coordination from previous CVA    Occupational performance deficits  (Please refer to evaluation for details):  ADL's;IADL's;Leisure    Body Structure / Function / Physical Skills  ADL;Dexterity;Flexibility;ROM;Strength;Vision;Balance;Coordination;FMC;IADL;Tone;Endurance;UE functional use;Mobility    Cognitive Skills  Attention;Safety Awareness;Problem Solve    Psychosocial Skills  Environmental  Adaptations;Routines and Behaviors;Habits;Interpersonal Interaction    Rehab Potential  Good    Clinical Decision Making  Several treatment options, min-mod task modification necessary    Comorbidities Affecting Occupational Performance:  May have comorbidities impacting occupational performance    Modification or Assistance to Complete Evaluation   Min-Moderate modification of tasks or assist with assess necessary to complete eval    OT Frequency  2x / week    OT Duration  12 weeks    OT Treatment/Interventions  Self-care/ADL training;Visual/perceptual remediation/compensation;DME and/or AE instruction;Patient/family education;Moist Heat;Therapeutic exercise;Manual Therapy;Therapeutic activities;Neuromuscular education    Consulted and Agree with Plan of Care  Patient    Family Member Consulted  wife, [name]       Patient will benefit from skilled therapeutic intervention in order to improve the following deficits and impairments:   Body Structure / Function / Physical Skills: ADL, Dexterity, Flexibility, ROM, Strength, Vision, Balance, Coordination, FMC, IADL, Tone, Endurance, UE functional use, Mobility Cognitive Skills: Attention, Safety Awareness, Problem Solve Psychosocial Skills: Environmental  Adaptations, Routines and Behaviors, Habits, Interpersonal Interaction   Visit Diagnosis: Muscle weakness (generalized)  Neurologic neglect syndrome  Other lack of coordination  Unsteadiness on feet    Problem List Patient Active Problem List   Diagnosis Date Noted  . Sleep disturbance   .  PAF (paroxysmal atrial fibrillation) ([place])   . Recurrent UTI   .  Hypokalemia   . Hyponatremia   . Abnormal urine   . 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 Benign essential HTN   . Urinary retention   . History of gout   . Global aphasia   . SAH (subarachnoid hemorrhage) ([place])   . SDH (subdural hematoma) ([place])   . Lymphocytosis   . Subdural hemorrhage following injury ([place]) 05/12/2016   James [name] T Suraya Vidrine, OTR/L, CLT  Cassandra Harbold 12/06/2019, 10:10 AM  [place] MAIN [facility] SERVICES [place], [place], [address] Phone: [phone]   Fax:  [phone]  Name: James [name] MRN: [id number] Date of Birth: Apr 15, 1941

## 2019-12-09 ENCOUNTER — Ambulatory Visit: Payer: PPO | Admitting: Occupational Therapy

## 2019-12-09 ENCOUNTER — Other Ambulatory Visit: Payer: Self-pay

## 2019-12-09 DIAGNOSIS — R278 Other lack of coordination: Secondary | ICD-10-CM

## 2019-12-09 DIAGNOSIS — R414 Neurologic neglect syndrome: Secondary | ICD-10-CM

## 2019-12-09 DIAGNOSIS — M6281 Muscle weakness (generalized): Secondary | ICD-10-CM | POA: Diagnosis not present

## 2019-12-09 DIAGNOSIS — R2681 Unsteadiness on feet: Secondary | ICD-10-CM

## 2019-12-10 ENCOUNTER — Encounter: Payer: Self-pay | Admitting: Occupational Therapy

## 2019-12-10 NOTE — Therapy (Signed)
[place] MAIN [facility] SERVICES [address] [place], [place], [address] Phone: [phone]   Fax:  [phone]  Occupational Therapy Treatment  Patient Details  Name: James [name] MRN: [id number] Date of Birth: 12/04/1940 Referring Provider (OT): James [name]   Encounter Date: 12/09/2019  OT End of Session - 12/10/19 1243    Visit Number  16    Number of Visits  24    Date for OT Re-Evaluation  01/08/20    OT Start Time  1102    OT Stop Time  1150    OT Time Calculation (min)  48 min    Activity Tolerance  Patient tolerated treatment well    Behavior During Therapy  James [name] for tasks assessed/performed       Past Medical History:  Diagnosis Date  . Alcohol abuse, in remission   . Atrial fibrillation ([place])   . Bilateral renal masses   . Closed right ankle fracture   . 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 Gait disorder   . Gout   . Hypertension   . Seizure disorder ([place])   . 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     Past Surgical History:  Procedure Laterality Date  . CRANIECTOMY FOR DEPRESSED SKULL FRACTURE  2009   with MRSA infection  . CRANIOTOMY     X 2  . HERNIA REPAIR     during childhood  . ORIF ANKLE FRACTURE Left 2012    There were no vitals filed for this visit.  Subjective Assessment - 12/10/19 1242    Subjective   Patient and wife reporting they are going out of town Wednesday and will not be here for the next appt, will need to cancel.    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  He suffered another fall in 2017.  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     Patient Stated Goals  Patient indicates he wants to be stronger, use his right hand for daily tasks, be as independent as possible.    Currently in Pain?   No/denies    Multiple Pain Sites  No      Patient seen this date for tasks involving manipulation of small 1 inch square sized tiles (scrabble size) to pick up and turn over, sliding to edge of table and using index and thumb to pick up, using isolated index finger on right to move tiles laterally and up and down, and using the letters for scanning for right sided visual attention.  Worked on formulating the letters of his name, able to complete first name but needed therapist to write out patient's last name and using the copy to match the letters.  Patient able to formulate small 3-4 letter words with minimal cues such at eat, ate, bin, gas, etc.  Response to tx:   Patient was very focused on task but when therapist suggested he perform at home, patient making a face indicating he didn't want to.  Discussed with wife and patient how this activity incorporates a lot of his limitations and skills and would be beneficial.  Patient to think about it more.  Patient and wife to go out of town this week so patient will only be seen one time.  Has home  exercises with thumb ROM, manipulation and [place] tasks he can work on while traveling.  Continue OT to increase safety and independence in daily tasks.                       OT Education - 12/10/19 1242    Education Details  manipulation skills of 1" square objects    Person(s) Educated  Patient    Methods  Explanation;Demonstration;Verbal cues    Comprehension  Verbalized understanding;Returned demonstration;Verbal cues required          OT Long Term Goals - 11/18/19 1110      OT LONG TERM GOAL #1   Title  Patient will demonstrate use of utensil in right hand for self feeding with modified independence with little to no spillage.     Baseline  unable at eval to hold utensil, 10th visit holding utensil, some feeding    Time  12    Period  Weeks    Status  On-going    Target Date  01/08/20      OT LONG TERM GOAL #2   Title  Pt  will increase hand strength in right by 5# to be able to cut food with modified independence.    Baseline  unable to cut meat at evaluation, 10th visit starting to work on cutting meat    Time  12    Period  Weeks    Status  On-going    Target Date  01/08/20      OT [place] #3   Title  Patient will demonstrate increased awareness and strategies to attend to right side to hold items without dropping items.    Baseline  difficulty with detecting items in right hand at eval, forgets items are in hand, 10th visit improved but still working on    Time  12    Period  Weeks    Status  On-going    Target Date  01/08/20      OT LONG TERM GOAL #4   Title  Patient will demonstrate independence in home exercise program for ROM, strength and coordination of right hand.    Baseline  eval-patient has not been performing exercises from last episode of care and demonstrates decline, 10th visit doing exercises at home and continue to modify program    Time  6    Period  Weeks    Status  On-going    Target Date  11/25/19      OT LONG TERM GOAL #6   Title  Patient will demonstrate pouring a drink with the right hand with attention to right side and not knocking the cup over and spilling.      Baseline  unable at eval, 10th visit starting to use right hand more and attempting to pour with right.    Time  12    Period  Weeks    Status  On-going    Target Date  01/08/20            Plan - 12/10/19 1244    Clinical Impression Statement  Patient was very focused on task but when therapist suggested he perform at home, patient making a face indicating he didn't want to.  Discussed with wife and patient how this activity incorporates a lot of his limitations and skills and would be beneficial.  Patient to think about it more.  Patient and wife to go out of town this week so patient will only  be seen one time.  Has home exercises with thumb ROM, manipulation and [place] tasks he can work on while  traveling.  Continue OT to increase safety and independence in daily tasks.    OT Occupational Profile and History  Detailed Assessment- Review of Records and additional review of physical, cognitive, psychosocial history related to current functional performance    Occupational Profile and client history currently impacting functional performance  repeated falls, TBI, expressive aphasia, limited motion in right hand, lack of coordination from previous CVA    Occupational performance deficits (Please refer to evaluation for details):  ADL's;IADL's;Leisure    Body Structure / Function / Physical Skills  ADL;Dexterity;Flexibility;ROM;Strength;Vision;Balance;Coordination;FMC;IADL;Tone;Endurance;UE functional use;Mobility    Cognitive Skills  Attention;Safety Awareness;Problem Solve    Psychosocial Skills  Environmental  Adaptations;Routines and Behaviors;Habits;Interpersonal Interaction    Rehab Potential  Good    Clinical Decision Making  Several treatment options, min-mod task modification necessary    Comorbidities Affecting Occupational Performance:  May have comorbidities impacting occupational performance    Modification or Assistance to Complete Evaluation   Min-Moderate modification of tasks or assist with assess necessary to complete eval    OT Frequency  2x / week    OT Duration  12 weeks    OT Treatment/Interventions  Self-care/ADL training;Visual/perceptual remediation/compensation;DME and/or AE instruction;Patient/family education;Moist Heat;Therapeutic exercise;Manual Therapy;Therapeutic activities;Neuromuscular education    Consulted and Agree with Plan of Care  Patient       Patient will benefit from skilled therapeutic intervention in order to improve the following deficits and impairments:   Body Structure / Function / Physical Skills: ADL, Dexterity, Flexibility, ROM, Strength, Vision, Balance, Coordination, FMC, IADL, Tone, Endurance, UE functional use, Mobility Cognitive Skills:  Attention, Safety Awareness, Problem Solve Psychosocial Skills: Environmental  Adaptations, Routines and Behaviors, Habits, Interpersonal Interaction   Visit Diagnosis: Muscle weakness (generalized)  Neurologic neglect syndrome  Other lack of coordination  Unsteadiness on feet    Problem List Patient Active Problem List   Diagnosis Date Noted  . Sleep disturbance   . PAF (paroxysmal atrial fibrillation) ([place])   . Recurrent UTI   . Hypokalemia   . Hyponatremia   . Abnormal urine   . 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 Benign essential HTN   . Urinary retention   . History of gout   . Global aphasia   . SAH (subarachnoid hemorrhage) ([place])   . SDH (subdural hematoma) ([place])   . Lymphocytosis   . Subdural hemorrhage following injury ([place]) 05/12/2016   Rook Maue T Jenel Gierke, OTR/L, CLT  James [name] 12/10/2019, 1:18 PM  [place] MAIN [name] SERVICES [address] [place], [place], [address] Phone: [phone]   Fax:  [phone]  Name: James [name] MRN: [id number] Date of Birth: 1940/09/26

## 2019-12-11 ENCOUNTER — Ambulatory Visit: Payer: PPO | Admitting: Occupational Therapy

## 2019-12-16 ENCOUNTER — Other Ambulatory Visit: Payer: Self-pay

## 2019-12-16 ENCOUNTER — Encounter: Payer: Self-pay | Admitting: Occupational Therapy

## 2019-12-16 ENCOUNTER — Ambulatory Visit: Payer: PPO | Admitting: Occupational Therapy

## 2019-12-16 DIAGNOSIS — M6281 Muscle weakness (generalized): Secondary | ICD-10-CM | POA: Diagnosis not present

## 2019-12-16 DIAGNOSIS — R278 Other lack of coordination: Secondary | ICD-10-CM

## 2019-12-16 DIAGNOSIS — R414 Neurologic neglect syndrome: Secondary | ICD-10-CM

## 2019-12-16 DIAGNOSIS — R2681 Unsteadiness on feet: Secondary | ICD-10-CM

## 2019-12-16 NOTE — Therapy (Signed)
[place] MAIN [facility] SERVICES [address] [place], [place], [address] Phone: [phone]   Fax:  [phone]  Occupational Therapy Treatment  Patient Details  Name: James [name] MRN: [id number] Date of Birth: 09/26/1940 Referring Provider (OT): Joselyn Arrow   Encounter Date: 12/16/2019  OT End of Session - 12/18/19 1111    Visit Number  17    Number of Visits  24    Date for OT Re-Evaluation  01/08/20    OT Start Time  1105    OT Stop Time  1150    OT Time Calculation (min)  45 min    Activity Tolerance  Patient tolerated treatment well    Behavior During Therapy  [facility] for tasks assessed/performed       Past Medical History:  Diagnosis Date  . Alcohol abuse, in remission   . Atrial fibrillation ([place])   . Bilateral renal masses   . Closed right ankle fracture   . 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 Gait disorder   . Gout   . Hypertension   . Seizure disorder ([place])   . 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     Past Surgical History:  Procedure Laterality Date  . CRANIECTOMY FOR DEPRESSED SKULL FRACTURE  2009   with MRSA infection  . CRANIOTOMY     X 2  . HERNIA REPAIR     during childhood  . ORIF ANKLE FRACTURE Left 2012    There were no vitals filed for this visit.  Subjective Assessment - 12/18/19 1110    Subjective   Patient and wife report they had a nice time going out of town to [facility].  Patient did not have a chance to work on the scrabble word tiles at home but wife reports they will try this week.    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  He suffered another fall in 2017.  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     Patient Stated Goals  Patient indicates he wants to be stronger, use his right hand for  daily tasks, be as independent as possible.    Currently in Pain?  No/denies    Multiple Pain Sites  No       Patient seen for thumb and digit ROM for flexion, extension, palmar abd, radial abduction with prolonged stretching at MPs and cues performed prior to functional hand tasks.  Patient performing resistive grasping with use of velcro squares to place and remove from grid placed in a variety of planes to also encourage reaching patterns and attention to right side.    Therapeutic Activity:  Patient engaging in [employer], tetris style with visual key to complete puzzle.  Patient required cues for perceptual skills when pieces need to be flipped to the other side for a mirror effect.  This task focused on right sided visual attention, scanning, manipulation skills with use of right hand with emphasis on isolated finger movements and prehension patterns, reach and problem solving abilities.  ADL: Patient seen for focus on Handwriting with use of gel pen in right dominant hand, cues for positioning of pen to facilitate tripod grasp.  Cues to readjust as needed while attempts to formulate his name.  Fair legibility this date  for multiple trials.   Response to tx: Patient continues to respond well to cues as well and increased repetition and focus on specific hand skills, prehension patterns, visual attention and isolated finger movements.  Patient continues to work on exercises at home to improve right hand function.  Patient has seen a translation in these skills into self feeding and grooming tasks at home and in the community.  Continue to work towards goals to maximize safety and independence in daily tasks.                     OT Education - 12/18/19 1111    Education provided  Yes    Education Details  handwriting, prehension patterns, right sided visual awareness.    Person(s) Educated  Patient    Methods  Explanation;Demonstration;Verbal cues    Comprehension   Verbalized understanding;Returned demonstration;Verbal cues required          OT Long Term Goals - 11/18/19 1110      OT LONG TERM GOAL #1   Title  Patient will demonstrate use of utensil in right hand for self feeding with modified independence with little to no spillage.     Baseline  unable at eval to hold utensil, 10th visit holding utensil, some feeding    Time  12    Period  Weeks    Status  On-going    Target Date  01/08/20      OT LONG TERM GOAL #2   Title  Pt will increase hand strength in right by 5# to be able to cut food with modified independence.    Baseline  unable to cut meat at evaluation, 10th visit starting to work on cutting meat    Time  12    Period  Weeks    Status  On-going    Target Date  01/08/20      OT [place] #3   Title  Patient will demonstrate increased awareness and strategies to attend to right side to hold items without dropping items.    Baseline  difficulty with detecting items in right hand at eval, forgets items are in hand, 10th visit improved but still working on    Time  12    Period  Weeks    Status  On-going    Target Date  01/08/20      OT LONG TERM GOAL #4   Title  Patient will demonstrate independence in home exercise program for ROM, strength and coordination of right hand.    Baseline  eval-patient has not been performing exercises from last episode of care and demonstrates decline, 10th visit doing exercises at home and continue to modify program    Time  6    Period  Weeks    Status  On-going    Target Date  11/25/19      OT LONG TERM GOAL #6   Title  Patient will demonstrate pouring a drink with the right hand with attention to right side and not knocking the cup over and spilling.      Baseline  unable at eval, 10th visit starting to use right hand more and attempting to pour with right.    Time  12    Period  Weeks    Status  On-going    Target Date  01/08/20            Plan - 12/18/19 1112     Clinical Impression Statement  Patient  continues to respond well to cues as well and increased repetition and focus on specific hand skills, prehension patterns, visual attention and isolated finger movements.  Patient continues to work on exercises at home to improve right hand function.  Patient has seen a translation in these skills into self feeding and grooming tasks at home and in the community.  Continue to work towards goals to maximize safety and independence in daily tasks.    OT Occupational Profile and History  Detailed Assessment- Review of Records and additional review of physical, cognitive, psychosocial history related to current functional performance    Occupational Profile and client history currently impacting functional performance  repeated falls, TBI, expressive aphasia, limited motion in right hand, lack of coordination from previous CVA    Occupational performance deficits (Please refer to evaluation for details):  ADL's;IADL's;Leisure    Body Structure / Function / Physical Skills  ADL;Dexterity;Flexibility;ROM;Strength;Vision;Balance;Coordination;FMC;IADL;Tone;Endurance;UE functional use;Mobility    Cognitive Skills  Attention;Safety Awareness;Problem Solve    Psychosocial Skills  Environmental  Adaptations;Routines and Behaviors;Habits;Interpersonal Interaction    Rehab Potential  Good    Clinical Decision Making  Several treatment options, min-mod task modification necessary    Comorbidities Affecting Occupational Performance:  May have comorbidities impacting occupational performance    Modification or Assistance to Complete Evaluation   Min-Moderate modification of tasks or assist with assess necessary to complete eval    OT Frequency  2x / week    OT Duration  12 weeks    OT Treatment/Interventions  Self-care/ADL training;Visual/perceptual remediation/compensation;DME and/or AE instruction;Patient/family education;Moist Heat;Therapeutic exercise;Manual Therapy;Therapeutic  activities;Neuromuscular education    Consulted and Agree with Plan of Care  Patient       Patient will benefit from skilled therapeutic intervention in order to improve the following deficits and impairments:   Body Structure / Function / Physical Skills: ADL, Dexterity, Flexibility, ROM, Strength, Vision, Balance, Coordination, FMC, IADL, Tone, Endurance, UE functional use, Mobility Cognitive Skills: Attention, Safety Awareness, Problem Solve Psychosocial Skills: Environmental  Adaptations, Routines and Behaviors, Habits, Interpersonal Interaction   Visit Diagnosis: Muscle weakness (generalized)  Neurologic neglect syndrome  Other lack of coordination  Unsteadiness on feet    Problem List Patient Active Problem List   Diagnosis Date Noted  . Sleep disturbance   . PAF (paroxysmal atrial fibrillation) ([place])   . Recurrent UTI   . Hypokalemia   . Hyponatremia   . Abnormal urine   . 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 Benign essential HTN   . Urinary retention   . History of gout   . Global aphasia   . SAH (subarachnoid hemorrhage) ([place])   . SDH (subdural hematoma) ([place])   . Lymphocytosis   . Subdural hemorrhage following injury [facility]) 05/12/2016   Messina Kosinski T Eleasha Cataldo, OTR/L, CLT  Tacia Hindley 12/19/2019, 11:32 AM  [place] MAIN [facility] SERVICES [address] [place], [place], [address] Phone: [phone]   Fax:  [phone]  Name: Buell Zerbe MRN: [id number] Date of Birth: 27-Jun-1941

## 2019-12-18 ENCOUNTER — Ambulatory Visit: Payer: PPO | Admitting: Occupational Therapy

## 2019-12-18 ENCOUNTER — Other Ambulatory Visit: Payer: Self-pay

## 2019-12-18 DIAGNOSIS — M6281 Muscle weakness (generalized): Secondary | ICD-10-CM | POA: Diagnosis not present

## 2019-12-18 DIAGNOSIS — R2681 Unsteadiness on feet: Secondary | ICD-10-CM

## 2019-12-18 DIAGNOSIS — R278 Other lack of coordination: Secondary | ICD-10-CM

## 2019-12-18 DIAGNOSIS — R414 Neurologic neglect syndrome: Secondary | ICD-10-CM

## 2019-12-19 ENCOUNTER — Encounter: Payer: Self-pay | Admitting: Occupational Therapy

## 2019-12-19 DIAGNOSIS — M1A09X0 Idiopathic chronic gout, multiple sites, without tophus (tophi): Secondary | ICD-10-CM | POA: Diagnosis not present

## 2019-12-19 DIAGNOSIS — D696 Thrombocytopenia, unspecified: Secondary | ICD-10-CM | POA: Diagnosis not present

## 2019-12-19 DIAGNOSIS — E7849 Other hyperlipidemia: Secondary | ICD-10-CM | POA: Diagnosis not present

## 2019-12-19 DIAGNOSIS — I4891 Unspecified atrial fibrillation: Secondary | ICD-10-CM | POA: Diagnosis not present

## 2019-12-19 DIAGNOSIS — Z1159 Encounter for screening for other viral diseases: Secondary | ICD-10-CM | POA: Diagnosis not present

## 2019-12-19 DIAGNOSIS — I10 Essential (primary) hypertension: Secondary | ICD-10-CM | POA: Diagnosis not present

## 2019-12-19 DIAGNOSIS — E538 Deficiency of other specified B group vitamins: Secondary | ICD-10-CM | POA: Diagnosis not present

## 2019-12-19 NOTE — Therapy (Signed)
[place] MAIN [facility] SERVICES [address] [place], [place], [address] Phone: [phone]   Fax:  [phone]  Occupational Therapy Treatment  Patient Details  Name: James [name] MRN: [id number] Date of Birth: 1940-10-02 Referring Provider (OT): Joselyn Arrow   Encounter Date: 12/18/2019  OT End of Session - 12/19/19 1420    Visit Number  18    Number of Visits  24    Date for OT Re-Evaluation  01/08/20    OT Start Time  1117    OT Stop Time  1205    OT Time Calculation (min)  48 min    Activity Tolerance  Patient tolerated treatment well    Behavior During Therapy  [facility] for tasks assessed/performed       Past Medical History:  Diagnosis Date  . Alcohol abuse, in remission   . Atrial fibrillation ([place])   . Bilateral renal masses   . Closed right ankle fracture   . 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 Gait disorder   . Gout   . Hypertension   . Seizure disorder ([place])   . 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     Past Surgical History:  Procedure Laterality Date  . CRANIECTOMY FOR DEPRESSED SKULL FRACTURE  2009   with MRSA infection  . CRANIOTOMY     X 2  . HERNIA REPAIR     during childhood  . ORIF ANKLE FRACTURE Left 2012    There were no vitals filed for this visit.  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  He suffered another fall in 2017.  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     Patient Stated Goals  Patient indicates he wants to be stronger, use his right hand for daily tasks, be as independent as possible.    Currently in Pain?  No/denies    Multiple Pain Sites  No         Therapeutic Exercise:  Strengthening with use of [employer] for 8 mins, forwards/backwards, alternating levels of resistance of 2.0 to 3.0.  Therapist in constant attendance to ensure grip and to adjust settings.  Neuro: Manipulation of Cards for shuffling, dealing, sorting and flipping during use with game and sorting by suit from a scatterpile Cues for right side attention and awareness.  Cues to utilize right hand while performing task. Manipulation of Coins at table top sliding to edge with index finger, attempting to place into resistive bank with difficulty placing into slot but if placed on slot he is able to push in with index finger on right in isolation.    Response to tx:  Patient progressing well with isolated index finger movement to move items by pushing or pulling with index finger, tip to tip to pick up items such as coins or cards at the edge of the table.  Cues for scanning when he cannot find an object in scatter pile.  Continue to work towards goals to improve right UE awareness and increased functional use of right UE safely into daily tasks.  OT Education - 12/19/19 1419    Education provided  Yes    Education Details  right sided visual attention, scanning, hand skills    Person(s) Educated  Patient    Methods  Explanation;Demonstration;Verbal cues    Comprehension  Verbalized understanding;Returned demonstration;Verbal cues required          OT Long Term Goals - 11/18/19 1110      OT LONG TERM GOAL #1   Title  Patient will demonstrate use of utensil in right hand for self feeding with modified independence with little to no spillage.     Baseline  unable at eval to hold utensil, 10th visit holding utensil, some feeding    Time  12    Period  Weeks    Status  On-going    Target Date  01/08/20      OT LONG TERM GOAL #2   Title  Pt will increase hand strength in right by 5# to be able to cut food with modified  independence.    Baseline  unable to cut meat at evaluation, 10th visit starting to work on cutting meat    Time  12    Period  Weeks    Status  On-going    Target Date  01/08/20      OT [place] #3   Title  Patient will demonstrate increased awareness and strategies to attend to right side to hold items without dropping items.    Baseline  difficulty with detecting items in right hand at eval, forgets items are in hand, 10th visit improved but still working on    Time  12    Period  Weeks    Status  On-going    Target Date  01/08/20      OT LONG TERM GOAL #4   Title  Patient will demonstrate independence in home exercise program for ROM, strength and coordination of right hand.    Baseline  eval-patient has not been performing exercises from last episode of care and demonstrates decline, 10th visit doing exercises at home and continue to modify program    Time  6    Period  Weeks    Status  On-going    Target Date  11/25/19      OT LONG TERM GOAL #6   Title  Patient will demonstrate pouring a drink with the right hand with attention to right side and not knocking the cup over and spilling.      Baseline  unable at eval, 10th visit starting to use right hand more and attempting to pour with right.    Time  12    Period  Weeks    Status  On-going    Target Date  01/08/20            Plan - 12/19/19 1421    Clinical Impression Statement  Patient progressing well with isolated index finger movement to move items by pushing or pulling with index finger, tip to tip to pick up items such as coins or cards at the edge of the table.  Cues for scanning when he cannot find an object in scatter pile.  Continue to work towards goals to improve right UE awareness and increased functional use of right UE safely into daily tasks.    OT Occupational Profile and History  Detailed Assessment- Review of Records and additional review of physical, cognitive, psychosocial history related to  current functional performance    Occupational Profile and  client history currently impacting functional performance  repeated falls, TBI, expressive aphasia, limited motion in right hand, lack of coordination from previous CVA    Occupational performance deficits (Please refer to evaluation for details):  ADL's;IADL's;Leisure    Body Structure / Function / Physical Skills  ADL;Dexterity;Flexibility;ROM;Strength;Vision;Balance;Coordination;FMC;IADL;Tone;Endurance;UE functional use;Mobility    Cognitive Skills  Attention;Safety Awareness;Problem Solve    Psychosocial Skills  Environmental  Adaptations;Routines and Behaviors;Habits;Interpersonal Interaction    Rehab Potential  Good    Clinical Decision Making  Several treatment options, min-mod task modification necessary    Comorbidities Affecting Occupational Performance:  May have comorbidities impacting occupational performance    Modification or Assistance to Complete Evaluation   Min-Moderate modification of tasks or assist with assess necessary to complete eval    OT Frequency  2x / week    OT Duration  12 weeks    OT Treatment/Interventions  Self-care/ADL training;Visual/perceptual remediation/compensation;DME and/or AE instruction;Patient/family education;Moist Heat;Therapeutic exercise;Manual Therapy;Therapeutic activities;Neuromuscular education    Consulted and Agree with Plan of Care  Patient       Patient will benefit from skilled therapeutic intervention in order to improve the following deficits and impairments:   Body Structure / Function / Physical Skills: ADL, Dexterity, Flexibility, ROM, Strength, Vision, Balance, Coordination, FMC, IADL, Tone, Endurance, UE functional use, Mobility Cognitive Skills: Attention, Safety Awareness, Problem Solve Psychosocial Skills: Environmental  Adaptations, Routines and Behaviors, Habits, Interpersonal Interaction   Visit Diagnosis: Neurologic neglect syndrome  Muscle weakness  (generalized)  Other lack of coordination  Unsteadiness on feet    Problem List Patient Active Problem List   Diagnosis Date Noted  . Sleep disturbance   . PAF (paroxysmal atrial fibrillation) ([place])   . Recurrent UTI   . Hypokalemia   . Hyponatremia   . Abnormal urine   . 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 Benign essential HTN   . Urinary retention   . History of gout   . Global aphasia   . SAH (subarachnoid hemorrhage) ([place])   . SDH (subdural hematoma) ([place])   . Lymphocytosis   . Subdural hemorrhage following injury [facility]) 05/12/2016   Esias Mory T Shawnette Augello, OTR/L, CLT  Exavier Lina 12/19/2019, 2:29 PM  [place] MAIN [facility] SERVICES [address] [place], [place], [address] Phone: [phone]   Fax:  [phone]  Name: James [name] MRN: [id number] Date of Birth: August 17, 1940

## 2019-12-25 ENCOUNTER — Ambulatory Visit: Payer: PPO | Attending: Neurology | Admitting: Occupational Therapy

## 2019-12-25 ENCOUNTER — Other Ambulatory Visit: Payer: Self-pay

## 2019-12-25 DIAGNOSIS — M6281 Muscle weakness (generalized): Secondary | ICD-10-CM

## 2019-12-25 DIAGNOSIS — R414 Neurologic neglect syndrome: Secondary | ICD-10-CM | POA: Diagnosis not present

## 2019-12-25 DIAGNOSIS — R262 Difficulty in walking, not elsewhere classified: Secondary | ICD-10-CM | POA: Diagnosis not present

## 2019-12-25 DIAGNOSIS — R2681 Unsteadiness on feet: Secondary | ICD-10-CM

## 2019-12-25 DIAGNOSIS — R278 Other lack of coordination: Secondary | ICD-10-CM

## 2019-12-26 DIAGNOSIS — E538 Deficiency of other specified B group vitamins: Secondary | ICD-10-CM | POA: Diagnosis not present

## 2019-12-26 DIAGNOSIS — I2510 Atherosclerotic heart disease of native coronary artery without angina pectoris: Secondary | ICD-10-CM | POA: Diagnosis not present

## 2019-12-26 DIAGNOSIS — I10 Essential (primary) hypertension: Secondary | ICD-10-CM | POA: Diagnosis not present

## 2019-12-26 DIAGNOSIS — D696 Thrombocytopenia, unspecified: Secondary | ICD-10-CM | POA: Diagnosis not present

## 2019-12-26 DIAGNOSIS — F3342 Major depressive disorder, recurrent, in full remission: Secondary | ICD-10-CM | POA: Diagnosis not present

## 2019-12-26 DIAGNOSIS — K219 Gastro-esophageal reflux disease without esophagitis: Secondary | ICD-10-CM | POA: Diagnosis not present

## 2019-12-26 DIAGNOSIS — I4891 Unspecified atrial fibrillation: Secondary | ICD-10-CM | POA: Diagnosis not present

## 2019-12-26 DIAGNOSIS — N2889 Other specified disorders of kidney and ureter: Secondary | ICD-10-CM | POA: Diagnosis not present

## 2019-12-26 DIAGNOSIS — Z8673 Personal history of transient ischemic attack (TIA), and cerebral infarction without residual deficits: Secondary | ICD-10-CM | POA: Diagnosis not present

## 2019-12-26 DIAGNOSIS — E7849 Other hyperlipidemia: Secondary | ICD-10-CM | POA: Diagnosis not present

## 2019-12-26 DIAGNOSIS — G40209 Localization-related (focal) (partial) symptomatic epilepsy and epileptic syndromes with complex partial seizures, not intractable, without status epilepticus: Secondary | ICD-10-CM | POA: Diagnosis not present

## 2019-12-26 DIAGNOSIS — M1A09X0 Idiopathic chronic gout, multiple sites, without tophus (tophi): Secondary | ICD-10-CM | POA: Diagnosis not present

## 2019-12-27 ENCOUNTER — Encounter: Payer: Self-pay | Admitting: Occupational Therapy

## 2019-12-27 NOTE — Therapy (Signed)
[place] MAIN [facility] SERVICES [address] [place], [place], [address] Phone: [phone]   Fax:  [phone]  Occupational Therapy Treatment  Patient Details  Name: James [name] MRN: [id number] Date of Birth: 1941-01-21 Referring Provider (OT): Joselyn Arrow   Encounter Date: 12/25/2019  OT End of Session - 12/27/19 1235    Visit Number  19    Number of Visits  24    Date for OT Re-Evaluation  01/08/20    OT Start Time  1345    OT Stop Time  1431    OT Time Calculation (min)  46 min    Activity Tolerance  Patient tolerated treatment well    Behavior During Therapy  [facility] for tasks assessed/performed       Past Medical History:  Diagnosis Date  . Alcohol abuse, in remission   . Atrial fibrillation ([place])   . Bilateral renal masses   . Closed right ankle fracture   . 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 Gait disorder   . Gout   . Hypertension   . Seizure disorder ([place])   . 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     Past Surgical History:  Procedure Laterality Date  . CRANIECTOMY FOR DEPRESSED SKULL FRACTURE  2009   with MRSA infection  . CRANIOTOMY     X 2  . HERNIA REPAIR     during childhood  . ORIF ANKLE FRACTURE Left 2012    There were no vitals filed for this visit.  Subjective Assessment - 12/27/19 1234    Subjective   Patient and wife report things are going well, no complaints, will plan to see therapist next week at an alternate clinic.    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  He suffered another fall in 2017.  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     Patient Stated Goals  Patient indicates he wants to be stronger, use his right hand for daily tasks, be as independent as possible.    Currently in Pain?   No/denies    Multiple Pain Sites  No       Patient seen this date for use of tool bench with use of small phillips head screwdriver and screws as well as small hex nuts/wrench.  Using left hand under work bench to manage nuts while using wrenches with cues on top of the work bench.  Difficulty with using right hand under the bench with vision occluded and decreased sensation.  Patient working on isolated finger movements to move washers, nuts and bolts. Worked towards prehension patterns with index and thumb for manipulation of nuts and bolts at times.  Patient also seen for tool use and cues for grasping patterns on regular screwdriver and hex nut tool.            Response to tx: Patient continues to progress with right hand function and incorporating more into daily routine spontaneously and with min cues at other times.  Patient continues to demonstrate difficulty with picking up flat objects from tabletop such as washers or coins but able to use isolated finger movements to bring to the edge to pick up between thumb and index finger.  Patient continues to work towards right sided visual attention  for safety during mobility and daily tasks.  He responds well to cues but does not always self identify the need to scan to the right during functional mobility.  Continue to work towards goals in plan of care to improve right hand function and right sided awareness for safety and greater independence.          OT Education - 12/27/19 1234    Education provided  Yes    Education Details  right sided visual attention, scanning, hand skills    Person(s) Educated  Patient    Methods  Explanation;Demonstration;Verbal cues    Comprehension  Verbalized understanding;Returned demonstration;Verbal cues required          OT Long Term Goals - 11/18/19 1110      OT LONG TERM GOAL #1   Title  Patient will demonstrate use of utensil in right hand for self feeding with modified independence  with little to no spillage.     Baseline  unable at eval to hold utensil, 10th visit holding utensil, some feeding    Time  12    Period  Weeks    Status  On-going    Target Date  01/08/20      OT LONG TERM GOAL #2   Title  Pt will increase hand strength in right by 5# to be able to cut food with modified independence.    Baseline  unable to cut meat at evaluation, 10th visit starting to work on cutting meat    Time  12    Period  Weeks    Status  On-going    Target Date  01/08/20      OT [place] #3   Title  Patient will demonstrate increased awareness and strategies to attend to right side to hold items without dropping items.    Baseline  difficulty with detecting items in right hand at eval, forgets items are in hand, 10th visit improved but still working on    Time  12    Period  Weeks    Status  On-going    Target Date  01/08/20      OT LONG TERM GOAL #4   Title  Patient will demonstrate independence in home exercise program for ROM, strength and coordination of right hand.    Baseline  eval-patient has not been performing exercises from last episode of care and demonstrates decline, 10th visit doing exercises at home and continue to modify program    Time  6    Period  Weeks    Status  On-going    Target Date  11/25/19      OT LONG TERM GOAL #6   Title  Patient will demonstrate pouring a drink with the right hand with attention to right side and not knocking the cup over and spilling.      Baseline  unable at eval, 10th visit starting to use right hand more and attempting to pour with right.    Time  12    Period  Weeks    Status  On-going    Target Date  01/08/20            Plan - 12/27/19 1235    Clinical Impression Statement  Patient continues to progress with right hand function and incorporating more into daily routine spontaneously and with min cues at other times.  Patient continues to demonstrate difficulty with picking up flat objects from tabletop  such as washers or coins but able  to use isolated finger movements to bring to the edge to pick up between thumb and index finger.  Patient continues to work towards right sided visual attention for safety during mobility and daily tasks.  He responds well to cues but does not always self identify the need to scan to the right during functional mobility.  Continue to work towards goals in plan of care to improve right hand function and right sided awareness for safety and greater independence.    OT Occupational Profile and History  Detailed Assessment- Review of Records and additional review of physical, cognitive, psychosocial history related to current functional performance    Occupational Profile and client history currently impacting functional performance  repeated falls, TBI, expressive aphasia, limited motion in right hand, lack of coordination from previous CVA    Occupational performance deficits (Please refer to evaluation for details):  ADL's;IADL's;Leisure    Body Structure / Function / Physical Skills  ADL;Dexterity;Flexibility;ROM;Strength;Vision;Balance;Coordination;FMC;IADL;Tone;Endurance;UE functional use;Mobility    Cognitive Skills  Attention;Safety Awareness;Problem Solve    Psychosocial Skills  Environmental  Adaptations;Routines and Behaviors;Habits;Interpersonal Interaction    Rehab Potential  Good    Clinical Decision Making  Several treatment options, min-mod task modification necessary    Comorbidities Affecting Occupational Performance:  May have comorbidities impacting occupational performance    Modification or Assistance to Complete Evaluation   Min-Moderate modification of tasks or assist with assess necessary to complete eval    OT Frequency  2x / week    OT Duration  12 weeks    OT Treatment/Interventions  Self-care/ADL training;Visual/perceptual remediation/compensation;DME and/or AE instruction;Patient/family education;Moist Heat;Therapeutic exercise;Manual  Therapy;Therapeutic activities;Neuromuscular education    Consulted and Agree with Plan of Care  Patient       Patient will benefit from skilled therapeutic intervention in order to improve the following deficits and impairments:   Body Structure / Function / Physical Skills: ADL, Dexterity, Flexibility, ROM, Strength, Vision, Balance, Coordination, FMC, IADL, Tone, Endurance, UE functional use, Mobility Cognitive Skills: Attention, Safety Awareness, Problem Solve Psychosocial Skills: Environmental  Adaptations, Routines and Behaviors, Habits, Interpersonal Interaction   Visit Diagnosis: Neurologic neglect syndrome  Muscle weakness (generalized)  Other lack of coordination  Unsteadiness on feet  Difficulty in walking, not elsewhere classified    Problem List Patient Active Problem List   Diagnosis Date Noted  . Sleep disturbance   . PAF (paroxysmal atrial fibrillation) ([place])   . Recurrent UTI   . Hypokalemia   . Hyponatremia   . Abnormal urine   . 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 Benign essential HTN   . Urinary retention   . History of gout   . Global aphasia   . SAH (subarachnoid hemorrhage) ([place])   . SDH (subdural hematoma) ([place])   . Lymphocytosis   . Subdural hemorrhage following injury [place]) 05/12/2016   Navaeh Kehres T Destin Vinsant, OTR/L, CLT  Arlene Genova 12/27/2019, 12:39 PM  [place] MAIN [facility] SERVICES [address] [place], [place], [address] Phone: [phone]   Fax:  [phone]  Name: Rubin Dais MRN: [id number] Date of Birth: 07/30/1940

## 2019-12-30 ENCOUNTER — Ambulatory Visit: Payer: PPO | Admitting: Occupational Therapy

## 2019-12-30 ENCOUNTER — Encounter: Payer: Self-pay | Admitting: Occupational Therapy

## 2019-12-30 ENCOUNTER — Other Ambulatory Visit: Payer: Self-pay

## 2019-12-30 DIAGNOSIS — R414 Neurologic neglect syndrome: Secondary | ICD-10-CM

## 2019-12-30 DIAGNOSIS — R2681 Unsteadiness on feet: Secondary | ICD-10-CM

## 2019-12-30 DIAGNOSIS — R278 Other lack of coordination: Secondary | ICD-10-CM

## 2019-12-30 DIAGNOSIS — M6281 Muscle weakness (generalized): Secondary | ICD-10-CM

## 2020-01-01 ENCOUNTER — Encounter: Payer: Self-pay | Admitting: Occupational Therapy

## 2020-01-01 ENCOUNTER — Ambulatory Visit: Payer: PPO | Admitting: Occupational Therapy

## 2020-01-01 ENCOUNTER — Other Ambulatory Visit: Payer: Self-pay

## 2020-01-01 DIAGNOSIS — R2681 Unsteadiness on feet: Secondary | ICD-10-CM

## 2020-01-01 DIAGNOSIS — R414 Neurologic neglect syndrome: Secondary | ICD-10-CM

## 2020-01-01 DIAGNOSIS — R262 Difficulty in walking, not elsewhere classified: Secondary | ICD-10-CM

## 2020-01-01 DIAGNOSIS — R278 Other lack of coordination: Secondary | ICD-10-CM

## 2020-01-01 DIAGNOSIS — M6281 Muscle weakness (generalized): Secondary | ICD-10-CM

## 2020-01-01 NOTE — Therapy (Signed)
[place] [facility] [address], [place], [address] Phone: [phone]   Fax:  [phone]  Occupational Therapy Treatment/Progress Update Reporting period from 11/18/2019 to 12/30/2019  Patient Details  Name: James [name] MRN: [id number] Date of Birth: October 28, 1940 Referring Provider (OT): Joselyn Arrow   Encounter Date: 12/30/2019  OT End of Session - 12/31/19 1111    Visit Number  20    Number of Visits  24    Date for OT Re-Evaluation  01/08/20    OT Start Time  1335    OT Stop Time  1440    OT Time Calculation (min)  65 min    Activity Tolerance  Patient tolerated treatment well    Behavior During Therapy  [facility] for tasks assessed/performed       Past Medical History:  Diagnosis Date  . Alcohol abuse, in remission   . Atrial fibrillation ([place])   . Bilateral renal masses   . Closed right ankle fracture   . 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 Gait disorder   . Gout   . Hypertension   . Seizure disorder ([place])   . 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     Past Surgical History:  Procedure Laterality Date  . CRANIECTOMY FOR DEPRESSED SKULL FRACTURE  2009   with MRSA infection  . CRANIOTOMY     X 2  . HERNIA REPAIR     during childhood  . ORIF ANKLE FRACTURE Left 2012    There were no vitals filed for this visit.  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  He suffered another fall in 2017.  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     Patient Stated Goals  Patient indicates he wants to be stronger, use his right hand for daily tasks, be as  independent as possible.    Currently in Pain?  No/denies    Multiple Pain Sites  No       Progress Update, goals updated to reflect progress.  Patient seen at another clinic this date and 9 hole peg test not available.    Refer to flowsheet for other strength and ROM measurements  Patient seen this date with focus on fine motor coordination tasks with manipulation of [place] discs for picking up, stacking, turning and flipping to opposite side.  Cues for turning/flipping, as well as therapist demo and manual cues.  Patient demonstrating improved isolated finger movements with index finger on right but has difficulty at times using this finger to turn objects.  Able to stack up to 4 discs at a time.    Patient seen for tool use with tweezers for [employer] dexterity test, difficulty with maintaining pinch on tweezers, tried a couple of modified techniques and patient able to complete some repetitions with continued cues and readjustments for finger placements.   Patient would need continued focus on use of tweezers to improve grasping/pinching patterns to operate.    Response to tx: Patient has continued to make good progress in all areas.  Patient is feeding  himself more consistently with use of right dominant hand with moderate to min spillage at times.  He reports being able to cut meat the majority of times but still has difficulty with pouring a drink without spilling.  He continues to drop items especially when not visually attending to right hand and task due to decreased sensation and neglect.  Patient working towards more complex hand skills with coordination and manipulation as well as isolated finger movements.  Patient continues to benefit from skilled OT services to improve functional hand use for necessary daily tasks.   [facility] OT Assessment - 01/01/20 1121      Right Hand PROM   R Thumb MCP 0-60  58 Degrees    R Thumb IP 0-80  40 Degrees    R Thumb Radial  ADduction/ABduction 0-55  36    R Thumb Palmar ADduction/ABduction 0-45  26      Hand Function   Right Hand Grip (lbs)  52    Right Hand Lateral Pinch  15 lbs    Right Hand 3 Point Pinch  14 lbs    Left Hand Grip (lbs)  75    Left Hand Lateral Pinch  18 lbs    Left 3 point pinch  16 lbs                       OT Education - 12/31/19 1111    Education provided  Yes    Education Details  prehension patterns with use of tweezers, tip to tip prehension when picking up small objects    Person(s) Educated  Patient    Methods  Explanation;Demonstration;Verbal cues    Comprehension  Verbalized understanding;Returned demonstration;Verbal cues required          OT Long Term Goals - 12/31/19 1114      OT LONG TERM GOAL #1   Title  Patient will demonstrate use of utensil in right hand for self feeding with modified independence with little to no spillage.     Baseline  unable at eval to hold utensil, 10th visit holding utensil, some feeding. 20th visit, feeding self consistently but still has some moderate spillage    Time  12    Period  Weeks    Status  On-going    Target Date  01/08/20      OT LONG TERM GOAL #2   Title  Pt will increase hand strength in right by 5# to be able to cut food with modified independence.    Baseline  unable to cut meat at evaluation, 10th visit starting to work on cutting meat, patient reports difficulty with cutting meat    Time  12    Period  Weeks    Status  Achieved      OT LONG TERM GOAL #3   Title  Patient will demonstrate increased awareness and strategies to attend to right side to hold items without dropping items.    Baseline  difficulty with detecting items in right hand at eval, forgets items are in hand, 10th visit improved but still working on 12-30-2019: minimal dropping of items this date.  20th visit continued dropping of items occasionally but with increased awareness    Time  12    Period  Weeks    Status  On-going     Target Date  01/08/20      OT LONG TERM GOAL #4   Title  Patient will demonstrate independence in home exercise program  for ROM, strength and coordination of right hand.    Baseline  eval-patient has not been performing exercises from last episode of care and demonstrates decline, 10th visit doing exercises at home and continue to modify program. 20th visit pt continues to engage in HEP with upgrades frequently    Time  6    Period  Weeks    Status  Partially Met    Target Date  01/08/20      OT LONG TERM GOAL #6   Title  Patient will demonstrate pouring a drink with the right hand with attention to right side and not knocking the cup over and spilling.      Baseline  unable at eval, 10th visit starting to use right hand more and attempting to pour with right.  20th visit still has difficulty at times    Time  12    Period  Weeks    Status  On-going    Target Date  01/08/20            Plan - 12/31/19 1112    Clinical Impression Statement  Patient has continued to make good progress in all areas.  Patient is feeding himself more consistently with use of right dominant hand with moderate to min spillage at times.  He reports being able to cut meat the majority of times but still has difficulty with pouring a drink without spilling.  He continues to drop items especially when not visually attending to right hand and task due to decreased sensation and neglect.  Patient working towards more complex hand skills with coordination and manipulation as well as isolated finger movements.  Patient continues to benefit from skilled OT services to improve functional hand use for necessary daily tasks.    OT Occupational Profile and History  Detailed Assessment- Review of Records and additional review of physical, cognitive, psychosocial history related to current functional performance    Occupational Profile and client history currently impacting functional performance  repeated falls, TBI,  expressive aphasia, limited motion in right hand, lack of coordination from previous CVA    Occupational performance deficits (Please refer to evaluation for details):  ADL's;IADL's;Leisure    Body Structure / Function / Physical Skills  ADL;Dexterity;Flexibility;ROM;Strength;Vision;Balance;Coordination;FMC;IADL;Tone;Endurance;UE functional use;Mobility    Cognitive Skills  Attention;Safety Awareness;Problem Solve    Psychosocial Skills  Environmental  Adaptations;Routines and Behaviors;Habits;Interpersonal Interaction    Rehab Potential  Good    Clinical Decision Making  Several treatment options, min-mod task modification necessary    Comorbidities Affecting Occupational Performance:  May have comorbidities impacting occupational performance    Modification or Assistance to Complete Evaluation   Min-Moderate modification of tasks or assist with assess necessary to complete eval    OT Frequency  2x / week    OT Duration  12 weeks    OT Treatment/Interventions  Self-care/ADL training;Visual/perceptual remediation/compensation;DME and/or AE instruction;Patient/family education;Moist Heat;Therapeutic exercise;Manual Therapy;Therapeutic activities;Neuromuscular education    Consulted and Agree with Plan of Care  Patient       Patient will benefit from skilled therapeutic intervention in order to improve the following deficits and impairments:   Body Structure / Function / Physical Skills: ADL, Dexterity, Flexibility, ROM, Strength, Vision, Balance, Coordination, FMC, IADL, Tone, Endurance, UE functional use, Mobility Cognitive Skills: Attention, Safety Awareness, Problem Solve Psychosocial Skills: Environmental  Adaptations, Routines and Behaviors, Habits, Interpersonal Interaction   Visit Diagnosis: Neurologic neglect syndrome  Muscle weakness (generalized)  Other lack of coordination  Unsteadiness on feet  Problem List Patient Active Problem List   Diagnosis Date Noted  . Sleep  disturbance   . PAF (paroxysmal atrial fibrillation) ([place])   . Recurrent UTI   . Hypokalemia   . Hyponatremia   . Abnormal urine   . 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 Benign essential HTN   . Urinary retention   . History of gout   . Global aphasia   . SAH (subarachnoid hemorrhage) ([place])   . SDH (subdural hematoma) ([place])   . Lymphocytosis   . Subdural hemorrhage following injury ([place]) 05/12/2016   Khya Halls T Layn Kye, OTR/L, CLT  Raiyan Dalesandro 01/01/2020, 11:33 AM  [place] [facility] [address] [place], [address] Phone: [phone]   Fax:  [phone]  Name: James [name] MRN: [id number] Date of Birth: 24-Jan-1941

## 2020-01-01 NOTE — Therapy (Signed)
[place] [facility] [address], [place], [address] Phone: [phone]   Fax:  [phone]  Occupational Therapy Treatment  Patient Details  Name: James [name] MRN: [id number] Date of Birth: 01/20/1941 Referring Provider (OT): Joselyn Arrow   Encounter Date: 01/01/2020  OT End of Session - 01/01/20 1417    Visit Number  21    Number of Visits  24    Date for OT Re-Evaluation  01/08/20    OT Start Time  1300    OT Stop Time  1350    OT Time Calculation (min)  50 min    Activity Tolerance  Patient tolerated treatment well    Behavior During Therapy  [facility] for tasks assessed/performed       Past Medical History:  Diagnosis Date  . Alcohol abuse, in remission   . Atrial fibrillation ([place])   . Bilateral renal masses   . Closed right ankle fracture   . 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 Gait disorder   . Gout   . Hypertension   . Seizure disorder ([place])   . 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     Past Surgical History:  Procedure Laterality Date  . CRANIECTOMY FOR DEPRESSED SKULL FRACTURE  2009   with MRSA infection  . CRANIOTOMY     X 2  . HERNIA REPAIR     during childhood  . ORIF ANKLE FRACTURE Left 2012    There were no vitals filed for this visit.  Subjective Assessment - 01/01/20 1413    Subjective   Patient reports he is doing well, has been working on thumb exercises at home for the right hand, wife in with patient today during session to see what he is working on and how he is progressing.    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  He suffered another fall in 2017.  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     Patient Stated Goals  Patient indicates he wants to be stronger, use his right  hand for daily tasks, be as independent as possible.    Currently in Pain?  No/denies    Multiple Pain Sites  No       Fluidotherapy for 10 mins to increase right UE joint ROM and prepare the hand for coordination tasks. Patient performing AROM of right digits, thumb and wrist while in fluidotherapy prior to exercises.   Patient seen for A/AAROM to right hand for thumb flexion, extension, palmar and radial ABD for multiple sets and reps.  Therapist providing cues and assist as needed towards end range.    Patient seen for tool use with tweezers for O[place]ner tweezer dexterity test, continued focus this date on use of tweezers to improve grasping/pinching patterns to operate.  Patient able to demonstrate a more secure grasp this date and pick up several small pieces of [place] to remove from board.  Cues for isolation of index and thumb as well as placement of tweezers in hand.   Response to tx:    Patient would likely perform well with toaster tongs and may try next session for a larger surface area.  Pt able to use tweezers to pick up small rubber bands with greater ease  than small metal pins.  Pt also seen for use of tip to tip prehension pattern to pick up small objects.  Patient required decreased cues to attend to right side and was able to demonstrate head turning to place objects into containers without cues.  Patient also able to demo exaggerated finger extension with release of small objects when using prehension patterns with fingers rather than tool.  Patient dropped only one item to the floor this date.                   OT Education - 01/01/20 1417    Education provided  Yes    Education Details  prehension patterns with use of tweezers, tip to tip prehension when picking up small objects    Person(s) Educated  Patient    Methods  Explanation;Demonstration;Verbal cues    Comprehension  Verbalized understanding;Returned demonstration;Verbal cues required           OT Long Term Goals - 12/31/19 1114      OT LONG TERM GOAL #1   Title  Patient will demonstrate use of utensil in right hand for self feeding with modified independence with little to no spillage.     Baseline  unable at eval to hold utensil, 10th visit holding utensil, some feeding. 20th visit, feeding self consistently but still has some moderate spillage    Time  12    Period  Weeks    Status  On-going    Target Date  01/08/20      OT LONG TERM GOAL #2   Title  Pt will increase hand strength in right by 5# to be able to cut food with modified independence.    Baseline  unable to cut meat at evaluation, 10th visit starting to work on cutting meat, patient reports difficulty with cutting meat    Time  12    Period  Weeks    Status  Achieved      OT LONG TERM GOAL #3   Title  Patient will demonstrate increased awareness and strategies to attend to right side to hold items without dropping items.    Baseline  difficulty with detecting items in right hand at eval, forgets items are in hand, 10th visit improved but still working on 12-30-2019: minimal dropping of items this date.  20th visit continued dropping of items occasionally but with increased awareness    Time  12    Period  Weeks    Status  On-going    Target Date  01/08/20      OT LONG TERM GOAL #4   Title  Patient will demonstrate independence in home exercise program for ROM, strength and coordination of right hand.    Baseline  eval-patient has not been performing exercises from last episode of care and demonstrates decline, 10th visit doing exercises at home and continue to modify program. 20th visit pt continues to engage in HEP with upgrades frequently    Time  6    Period  Weeks    Status  Partially Met    Target Date  01/08/20      OT LONG TERM GOAL #6   Title  Patient will demonstrate pouring a drink with the right hand with attention to right side and not knocking the cup over and spilling.      Baseline   unable at eval, 10th visit starting to use right hand more and attempting to pour with right.  20th visit still has  difficulty at times    Time  12    Period  Weeks    Status  On-going    Target Date  01/08/20            Plan - 01/01/20 1418    Clinical Impression Statement  Patient would likely perform well with toaster tongs and may try next session for a larger surface area.  Pt able to use tweezers to pick up small rubber bands with greater ease than small metal pins.  Pt also seen for use of tip to tip prehension pattern to pick up small objects.  Patient required decreased cues to attend to right side and was able to demonstrate head turning to place objects into containers without cues.  Patient also able to demo exaggerated finger extension with release of small objects when using prehension patterns with fingers rather than tool.  Patient dropped only one item to the floor this date.    OT Occupational Profile and History  Detailed Assessment- Review of Records and additional review of physical, cognitive, psychosocial history related to current functional performance    Occupational Profile and client history currently impacting functional performance  repeated falls, TBI, expressive aphasia, limited motion in right hand, lack of coordination from previous CVA    Occupational performance deficits (Please refer to evaluation for details):  ADL's;IADL's;Leisure    Body Structure / Function / Physical Skills  ADL;Dexterity;Flexibility;ROM;Strength;Vision;Balance;Coordination;FMC;IADL;Tone;Endurance;UE functional use;Mobility    Cognitive Skills  Attention;Safety Awareness;Problem Solve    Psychosocial Skills  Environmental  Adaptations;Routines and Behaviors;Habits;Interpersonal Interaction    Rehab Potential  Good    Clinical Decision Making  Several treatment options, min-mod task modification necessary    Comorbidities Affecting Occupational Performance:  May have comorbidities  impacting occupational performance    Modification or Assistance to Complete Evaluation   Min-Moderate modification of tasks or assist with assess necessary to complete eval    OT Frequency  2x / week    OT Duration  12 weeks    OT Treatment/Interventions  Self-care/ADL training;Visual/perceptual remediation/compensation;DME and/or AE instruction;Patient/family education;Moist Heat;Therapeutic exercise;Manual Therapy;Therapeutic activities;Neuromuscular education    Consulted and Agree with Plan of Care  Patient    Family Member Consulted  wife, [name]       Patient will benefit from skilled therapeutic intervention in order to improve the following deficits and impairments:   Body Structure / Function / Physical Skills: ADL, Dexterity, Flexibility, ROM, Strength, Vision, Balance, Coordination, FMC, IADL, Tone, Endurance, UE functional use, Mobility Cognitive Skills: Attention, Safety Awareness, Problem Solve Psychosocial Skills: Environmental  Adaptations, Routines and Behaviors, Habits, Interpersonal Interaction   Visit Diagnosis: Neurologic neglect syndrome  Muscle weakness (generalized)  Other lack of coordination  Unsteadiness on feet  Difficulty in walking, not elsewhere classified    Problem List Patient Active Problem List   Diagnosis Date Noted  . Sleep disturbance   . PAF (paroxysmal atrial fibrillation) ([place])   . Recurrent UTI   . Hypokalemia   . Hyponatremia   . Abnormal urine   . 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 Benign essential HTN   . Urinary retention   . History of gout   . Global aphasia   . SAH (subarachnoid hemorrhage) ([place])   . SDH (subdural hematoma) ([place])   . Lymphocytosis   . Subdural hemorrhage following injury ([place]) 05/12/2016   James [name] T Vontrell Pullman, OTR/L, CLT  Nicholos Aloisi 01/01/2020, 4:36 PM  [place] [facility] [address], [place], [address] Phone:  [phone]   Fax:  [phone]  Name: Khari Mally MRN: [id number] Date of Birth: 06/28/1941

## 2020-01-06 ENCOUNTER — Ambulatory Visit: Payer: PPO | Admitting: Occupational Therapy

## 2020-01-06 ENCOUNTER — Other Ambulatory Visit: Payer: Self-pay

## 2020-01-06 ENCOUNTER — Encounter: Payer: Self-pay | Admitting: Occupational Therapy

## 2020-01-06 DIAGNOSIS — R2681 Unsteadiness on feet: Secondary | ICD-10-CM

## 2020-01-06 DIAGNOSIS — M6281 Muscle weakness (generalized): Secondary | ICD-10-CM

## 2020-01-06 DIAGNOSIS — R414 Neurologic neglect syndrome: Secondary | ICD-10-CM

## 2020-01-06 DIAGNOSIS — R278 Other lack of coordination: Secondary | ICD-10-CM

## 2020-01-06 DIAGNOSIS — R262 Difficulty in walking, not elsewhere classified: Secondary | ICD-10-CM

## 2020-01-06 NOTE — Therapy (Signed)
[place] [facility] [address] [place], [place], [address] Phone: [phone]   Fax:  [phone]  Occupational Therapy Treatment  Patient Details  Name: James [name] MRN: [id number] Date of Birth: Jun 04, 1941 Referring Provider (OT): Joselyn Arrow   Encounter Date: 01/06/2020   OT End of Session - 01/06/20 1317    Visit Number 22    Number of Visits 24    Date for OT Re-Evaluation 01/08/20    OT Start Time 1300    OT Stop Time 1344    OT Time Calculation (min) 44 min    Activity Tolerance Patient tolerated treatment well    Behavior During Therapy [facility] for tasks assessed/performed           Past Medical History:  Diagnosis Date  . Alcohol abuse, in remission   . Atrial fibrillation ([place])   . Bilateral renal masses   . Closed right ankle fracture   . 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 Gait disorder   . Gout   . Hypertension   . Seizure disorder ([place])   . 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     Past Surgical History:  Procedure Laterality Date  . CRANIECTOMY FOR DEPRESSED SKULL FRACTURE  2009   with MRSA infection  . CRANIOTOMY     X 2  . HERNIA REPAIR     during childhood  . ORIF ANKLE FRACTURE Left 2012    There were no vitals filed for this visit.   Subjective Assessment - 01/06/20 1313    Subjective  Dicussed upcoming recertification for next session, patient to think about what progress he has made and goals.  Once we see how he is doing and determine frequency for continuing.  Patient would rather come to therapy one time a week although he benefits from 2 times a week.    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  He suffered another fall in 2017.  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     Patient  Stated Goals Patient indicates he wants to be stronger, use his right hand for daily tasks, be as independent as possible.    Currently in Pain? No/denies    Multiple Pain Sites No          Therapeutic Exercise Patient seen this date for finger strengthening with use of varying resistance pinch pins, cues for lateral, 3 point pinch and 2 point pinches with use of right hand with occasional cues for type of pinch based on level of resistance.  Pt able to perform all levels with use of lateral pinch.  Graded task to place pins on elevated surface with yardstick.  Pt required to hold yardstick with left hand while placing pins on with right hand.  Some difficulty with picking up and turning pins to place.   ADL/IADL:  Handwriting skills combined with words/speech with using small 3 inch by 3 inch sticky notes to write out words for colors such as red, blue, black and green. Patient able to copy words from therapist's paper but has difficulty with fitting words on the smaller note rather than a large piece of paper.  After 10 notes were completed, he worked towards placing the sticky notes onto a bulletin board with  using push pins.  Unable to fully push in pins into moderate resistance board with right hand, able to start the pin and then push with left hand.       Response to tx: Patient continues to progress with improved range of motion, strength and coordination skills.  patient requires cues for proper form and technique with pinch skills this date, encouraged reaching in elevation as well as bilateral hand use.  Patient has some difficulty with writing words/name on small pieces of paper, use of sticky notes this date, size 3X3.  Patient also has some difficulty with placing push pins into moderate resistance bulletin board this date with right hand.  He is able to start the pin but has to finish with use of left hand to push all the way into board.  Assist with spelling words due to aphasia but  can copy them if presented on paper. Patient continues to benefit from skilled occupational therapy services to maximize safety and independence in necessary daily activities at home and in the community.                 OT Education - 01/06/20 1316    Education provided Yes    Education Details pinch patterns, coordination    Person(s) Educated Patient    Methods Explanation;Demonstration;Verbal cues    Comprehension Verbalized understanding;Returned demonstration;Verbal cues required               OT Long Term Goals - 12/31/19 1114      OT LONG TERM GOAL #1   Title Patient will demonstrate use of utensil in right hand for self feeding with modified independence with little to no spillage.     Baseline unable at eval to hold utensil, 10th visit holding utensil, some feeding. 20th visit, feeding self consistently but still has some moderate spillage    Time 12    Period Weeks    Status On-going    Target Date 01/08/20      OT LONG TERM GOAL #2   Title Pt will increase hand strength in right by 5# to be able to cut food with modified independence.    Baseline unable to cut meat at evaluation, 10th visit starting to work on cutting meat, patient reports difficulty with cutting meat    Time 12    Period Weeks    Status Achieved      OT LONG TERM GOAL #3   Title Patient will demonstrate increased awareness and strategies to attend to right side to hold items without dropping items.    Baseline difficulty with detecting items in right hand at eval, forgets items are in hand, 10th visit improved but still working on 12-30-2019: minimal dropping of items this date.  20th visit continued dropping of items occasionally but with increased awareness    Time 12    Period Weeks    Status On-going    Target Date 01/08/20      OT LONG TERM GOAL #4   Title Patient will demonstrate independence in home exercise program for ROM, strength and coordination of right hand.    Baseline  eval-patient has not been performing exercises from last episode of care and demonstrates decline, 10th visit doing exercises at home and continue to modify program. 20th visit pt continues to engage in HEP with upgrades frequently    Time 6    Period Weeks    Status Partially Met    Target Date 01/08/20  OT LONG TERM GOAL #6   Title Patient will demonstrate pouring a drink with the right hand with attention to right side and not knocking the cup over and spilling.      Baseline unable at eval, 10th visit starting to use right hand more and attempting to pour with right.  20th visit still has difficulty at times    Time 12    Period Weeks    Status On-going    Target Date 01/08/20                 Plan - 01/06/20 1318    Clinical Impression Statement Patient continues to progress with improved range of motion, strength and coordination skills.  patient requires cues for proper form and technique with pinch skills this date, encouraged reaching in elevation as well as bilateral hand use.  Patient has some difficulty with writing words/name on small pieces of paper, use of sticky notes this date, size 3X3.  Patient also has some difficulty with placing push pins into moderate resistance bulletin board this date with right hand.  He is able to start the pin but has to finish with use of left hand to push all the way into board.  Assist with spelling words due to aphasia but can copy them if presented on paper. Patient continues to benefit from skilled occupational therapy services to maximize safety and independence in necessary daily activities at home and in the community.    OT Occupational Profile and History Detailed Assessment- Review of Records and additional review of physical, cognitive, psychosocial history related to current functional performance    Occupational Profile and client history currently impacting functional performance repeated falls, TBI, expressive aphasia, limited  motion in right hand, lack of coordination from previous CVA    Occupational performance deficits (Please refer to evaluation for details): ADL's;IADL's;Leisure    Body Structure / Function / Physical Skills ADL;Dexterity;Flexibility;ROM;Strength;Vision;Balance;Coordination;FMC;IADL;Tone;Endurance;UE functional use;Mobility    Cognitive Skills Attention;Safety Awareness;Problem Solve    Psychosocial Skills Environmental  Adaptations;Routines and Behaviors;Habits;Interpersonal Interaction    Rehab Potential Good    Clinical Decision Making Several treatment options, min-mod task modification necessary    Comorbidities Affecting Occupational Performance: May have comorbidities impacting occupational performance    Modification or Assistance to Complete Evaluation  Min-Moderate modification of tasks or assist with assess necessary to complete eval    OT Frequency 2x / week    OT Duration 12 weeks    OT Treatment/Interventions Self-care/ADL training;Visual/perceptual remediation/compensation;DME and/or AE instruction;Patient/family education;Moist Heat;Therapeutic exercise;Manual Therapy;Therapeutic activities;Neuromuscular education    Consulted and Agree with Plan of Care Patient    Family Member Consulted wife, [name]           Patient will benefit from skilled therapeutic intervention in order to improve the following deficits and impairments:   Body Structure / Function / Physical Skills: ADL, Dexterity, Flexibility, ROM, Strength, Vision, Balance, Coordination, FMC, IADL, Tone, Endurance, UE functional use, Mobility Cognitive Skills: Attention, Safety Awareness, Problem Solve Psychosocial Skills: Environmental  Adaptations, Routines and Behaviors, Habits, Interpersonal Interaction   Visit Diagnosis: Muscle weakness (generalized)  Other lack of coordination  Neurologic neglect syndrome  Unsteadiness on feet  Difficulty in walking, not elsewhere classified    Problem  List Patient Active Problem List   Diagnosis Date Noted  . Sleep disturbance   . PAF (paroxysmal atrial fibrillation) ([place])   . Recurrent UTI   . Hypokalemia   . Hyponatremia   . Abnormal urine   .  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 Benign essential HTN   . Urinary retention   . History of gout   . Global aphasia   . SAH (subarachnoid hemorrhage) ([place])   . SDH (subdural hematoma) ([place])   . Lymphocytosis   . Subdural hemorrhage following injury [facility]) 05/12/2016   Marcelino Campos T Moe Brier, OTR/L, CLT, Itzayana Pardy 01/06/2020, 3:10 PM  [place] [facility] SERVICES [address] [place], [place], [address] Phone: [phone]   Fax:  [phone]  Name: Idrees Quam MRN: [id number] Date of Birth: [birth year]

## 2020-01-08 ENCOUNTER — Other Ambulatory Visit: Payer: Self-pay

## 2020-01-08 ENCOUNTER — Ambulatory Visit: Payer: PPO | Admitting: Occupational Therapy

## 2020-01-08 ENCOUNTER — Encounter: Payer: Self-pay | Admitting: Occupational Therapy

## 2020-01-08 DIAGNOSIS — R414 Neurologic neglect syndrome: Secondary | ICD-10-CM

## 2020-01-08 DIAGNOSIS — R2681 Unsteadiness on feet: Secondary | ICD-10-CM

## 2020-01-08 DIAGNOSIS — M6281 Muscle weakness (generalized): Secondary | ICD-10-CM

## 2020-01-08 DIAGNOSIS — R278 Other lack of coordination: Secondary | ICD-10-CM

## 2020-01-08 NOTE — Therapy (Signed)
[place] MAIN [facility] SERVICES [address] [place], [place], [address] Phone: [phone]   Fax:  [phone]  Occupational Therapy Treatment/Recertification  Patient Details  Name: James [name] MRN: [id number] Date of Birth: November 17, 1940 Referring Provider (OT): Joselyn Arrow   Encounter Date: 01/08/2020   OT End of Session - 01/08/20 1425    Visit Number 23    Number of Visits 48    Date for OT Re-Evaluation 04/03/20    OT Start Time 1016    OT Stop Time 1101    OT Time Calculation (min) 45 min    Activity Tolerance Patient tolerated treatment well    Behavior During Therapy [facility] for tasks assessed/performed           Past Medical History:  Diagnosis Date  . Alcohol abuse, in remission   . Atrial fibrillation ([place])   . Bilateral renal masses   . Closed right ankle fracture   . 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 Gait disorder   . Gout   . Hypertension   . Seizure disorder ([place])   . 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     Past Surgical History:  Procedure Laterality Date  . CRANIECTOMY FOR DEPRESSED SKULL FRACTURE  2009   with MRSA infection  . CRANIOTOMY     X 2  . HERNIA REPAIR     during childhood  . ORIF ANKLE FRACTURE Left 2012    There were no vitals filed for this visit.   Subjective Assessment - 01/08/20 1422    Subjective  Patient's wife reports he is doing much better with self feeding at home, still uses left hand at times but when using right he is spilling items less.  Patient indicating he is doing well, is agreeable to continue with therapy 2 times a week since he is making good progress.    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  He suffered another fall in 2017.  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     Patient Stated Goals Patient indicates he wants to be stronger, use his right hand for daily tasks, be as independent as possible.    Currently in Pain? No/denies    Multiple Pain Sites No              [facility] OT Assessment - 01/08/20 1022      Coordination   Right 9 Hole Peg Test 4 min 47 sec    Left 9 Hole Peg Test 32      Right Hand PROM   R Thumb MCP 0-60 62 Degrees    R Thumb IP 0-80 60 Degrees    R Thumb Radial ADduction/ABduction 0-55 34    R Thumb Palmar ADduction/ABduction 0-45 38    R Thumb Opposition 1 --   thumb to all digits on right      Hand Function   Right Hand Grip (lbs) 40    Right Hand Lateral Pinch 15 lbs    Right Hand 3 Point Pinch 15 lbs    Left Hand Grip (lbs) 65    Left Hand Lateral Pinch 19 lbs    Left 3 point pinch 17 lbs           Therex: Patient  seen for reassessment of measurements, grip and pinch strengths, coordination skills, please refer to flow sheet for details.  PROM of right hand for digits and thumb followed by A/AAROM for right hand.   Patient seen for 7 hand exercises as follows:  7  hand exercises to help promote handwriting, dexterity and flexibility. 1)  Fisting with arm then arm extension with fingers extended with BIG hand movements 2) oppositional movements of the thumb to each digit 3) wrist flex/ext with elbows extended 4) supination/pronation 5) tendon gliding with hook fist and then finger extension 6) tendon gliding with tabletop movement of fingers with MP flexion, PIP and DIP extension 7) MP flexion, PIP flexion, DIP extension Difficulty with select exercises and requires assist from therapist for demonstration, hands on cues, blocking of joints as needed for desired effect.     ADL: Handwriting samples this date for name, fair to poor legibility but improving over time.  Cues for tripod grasp on pen.   Pt able to demo right hand to mouth patterns with utensils for self feeding.  Min to mod spillage  depending on consistency of food.  Patient dressing himself with modified independence.  Denies any difficulty with socks.  Performs buttons, snaps and zippers more with his left hand currently.    Response to tx: Patient has continued to make excellent progress.  Self feeding skills with right hand improving with time.  Continues to work towards modulation of grasping patterns with use of utensils.  Grip strength improved from eval but has varied in the last couple weeks.  Coordination with 9 hole peg test improving with right hand progressing from not being able to perform test at evl, then to almost 7 mins at 10th visit and then today he was able to complete in 4 mins 47 secs.  Patient continues to drop items frequently but less than at eval.  Patient continues to progress with improved range of motion, strength and coordination skills.  patient requires cues for proper form and technique during exercises for home program.  Patient continues to benefit from skilled occupational therapy services to maximize safety and independence in necessary daily activities at home and in the community. Therapist recommends continuing with frequency of 2 times per week with current rate of progress, patient is agreeable.            OT Education - 01/08/20 1424    Education provided Yes    Education Details updated goals, outcome measures, HEP    Person(s) Educated Patient;Spouse    Methods Explanation;Demonstration;Verbal cues    Comprehension Verbalized understanding;Returned demonstration;Verbal cues required               OT Long Term Goals - 01/08/20 1038      OT LONG TERM GOAL #1   Title Patient will demonstrate use of utensil in right hand for self feeding with modified independence with little to no spillage.     Baseline unable at eval to hold utensil, 10th visit holding utensil, some feeding. 20th visit, feeding self consistently but still has some moderate spillage    Time 12     Period Weeks    Status On-going    Target Date 04/03/20      OT LONG TERM GOAL #2   Title Pt will increase hand strength in right by 5# to be able to cut food with modified independence.    Baseline unable to cut meat at evaluation, 10th visit starting to work on cutting meat,  patient reports difficulty with cutting meat    Time 12    Period Weeks    Status Achieved      OT LONG TERM GOAL #3   Title Patient will demonstrate increased awareness and strategies to attend to right side to hold items without dropping items.    Baseline difficulty with detecting items in right hand at eval, forgets items are in hand, 10th visit improved but still working on 12-30-2019: minimal dropping of items this date.  20th visit continued dropping of items occasionally but with increased awareness    Time 12    Period Weeks    Status On-going    Target Date 04/03/20      OT LONG TERM GOAL #4   Title Patient will demonstrate independence in home exercise program for ROM, strength and coordination of right hand.    Baseline eval-patient has not been performing exercises from last episode of care and demonstrates decline, 10th visit doing exercises at home and continue to modify program. 20th visit pt continues to engage in HEP with upgrades frequently    Time 12    Period Weeks    Status Partially Met    Target Date 04/03/20      OT LONG TERM GOAL #6   Title Patient will demonstrate pouring a drink with the right hand with attention to right side and not knocking the cup over and spilling.      Baseline unable at eval, 10th visit starting to use right hand more and attempting to pour with right.  20th visit still has difficulty at times.    Time 12    Period Weeks    Status On-going    Target Date 04/03/20                 Plan - 01/08/20 1427    Clinical Impression Statement Patient has continued to make excellent progress.  Self feeding skills with right hand improving with time.  Continues  to work towards modulation of grasping patterns with use of utensils.  Grip strength improved from eval but has varied in the last couple weeks.  Coordination with 9 hole peg test improving with right hand progressing from not being able to perform test at evl, then to almost 7 mins at 10th visit and then today he was able to complete in 4 mins 47 secs.  Patient continues to drop items frequently but less than at eval.  Patient continues to progress with improved range of motion, strength and coordination skills.  patient requires cues for proper form and technique during exercises for home program.  Patient continues to benefit from skilled occupational therapy services to maximize safety and independence in necessary daily activities at home and in the community. Therapist recommends continuing with frequency of 2 times per week with current rate of progress, patient is agreeable.    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    Occupational performance deficits (Please refer to evaluation for details): ADL's;IADL's;Leisure    Body Structure / Function / Physical Skills ADL;Dexterity;Flexibility;ROM;Strength;Vision;Balance;Coordination;FMC;IADL;Tone;Endurance;UE functional use;Mobility    Cognitive Skills Attention;Safety Awareness;Problem Solve    Psychosocial Skills Environmental  Adaptations;Routines and Behaviors;Habits;Interpersonal Interaction    Rehab Potential Good  Clinical Decision Making Several treatment options, min-mod task modification necessary    Comorbidities Affecting Occupational Performance: May have comorbidities impacting occupational performance     Modification or Assistance to Complete Evaluation  Min-Moderate modification of tasks or assist with assess necessary to complete eval    OT Frequency 2x / week    OT Duration 12 weeks    OT Treatment/Interventions Self-care/ADL training;Visual/perceptual remediation/compensation;DME and/or AE instruction;Patient/family education;Moist Heat;Therapeutic exercise;Manual Therapy;Therapeutic activities;Neuromuscular education    Consulted and Agree with Plan of Care Patient    Family Member Consulted wife, [name]           Patient will benefit from skilled therapeutic intervention in order to improve the following deficits and impairments:   Body Structure / Function / Physical Skills: ADL, Dexterity, Flexibility, ROM, Strength, Vision, Balance, Coordination, FMC, IADL, Tone, Endurance, UE functional use, Mobility Cognitive Skills: Attention, Safety Awareness, Problem Solve Psychosocial Skills: Environmental  Adaptations, Routines and Behaviors, Habits, Interpersonal Interaction   Visit Diagnosis: Muscle weakness (generalized)  Other lack of coordination  Neurologic neglect syndrome  Unsteadiness on feet    Problem List Patient Active Problem List   Diagnosis Date Noted  . Sleep disturbance   . PAF (paroxysmal atrial fibrillation) ([place])   . Recurrent UTI   . Hypokalemia   . Hyponatremia   . Abnormal urine   . 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 Benign essential HTN   . Urinary retention   . History of gout   . Global aphasia   . SAH (subarachnoid hemorrhage) ([place])   . SDH (subdural hematoma) ([place])   . Lymphocytosis   . Subdural hemorrhage following injury [facility]) 05/12/2016   Mark Benecke T Powell Halbert, OTR/L, CLT  Skylen Danielsen 01/08/2020, 7:12 PM  [place] MAIN [facility] SERVICES [address] [place], [place], [address] Phone: [phone]   Fax:  [phone]  Name: James [name] MRN: [id number] Date of Birth: 01-29-1941

## 2020-01-15 ENCOUNTER — Ambulatory Visit: Payer: PPO | Admitting: Occupational Therapy

## 2020-01-16 ENCOUNTER — Encounter: Payer: PPO | Admitting: Occupational Therapy

## 2020-01-20 ENCOUNTER — Ambulatory Visit: Payer: PPO | Admitting: Occupational Therapy

## 2020-01-20 ENCOUNTER — Other Ambulatory Visit: Payer: Self-pay

## 2020-01-20 ENCOUNTER — Encounter: Payer: Self-pay | Admitting: Occupational Therapy

## 2020-01-20 DIAGNOSIS — M6281 Muscle weakness (generalized): Secondary | ICD-10-CM

## 2020-01-20 DIAGNOSIS — R278 Other lack of coordination: Secondary | ICD-10-CM

## 2020-01-20 DIAGNOSIS — R414 Neurologic neglect syndrome: Secondary | ICD-10-CM | POA: Diagnosis not present

## 2020-01-21 NOTE — Therapy (Signed)
[place] MAIN [facility] SERVICES [address] [place], [place], [address] Phone: [phone]   Fax:  [phone]  Occupational Therapy Treatment  Patient Details  Name: James [name] MRN: [id number] Date of Birth: 12/08/1940 Referring Provider (OT): Joselyn Arrow   Encounter Date: 01/20/2020   OT End of Session - 01/21/20 2144    Visit Number 24    Number of Visits 48    Date for OT Re-Evaluation 04/03/20    OT Start Time 1100    OT Stop Time 1146    OT Time Calculation (min) 46 min    Activity Tolerance Patient tolerated treatment well    Behavior During Therapy [facility] for tasks assessed/performed           Past Medical History:  Diagnosis Date  . Alcohol abuse, in remission   . Atrial fibrillation ([place])   . Bilateral renal masses   . Closed right ankle fracture   . 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 Gait disorder   . Gout   . Hypertension   . Seizure disorder ([place])   . 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     Past Surgical History:  Procedure Laterality Date  . CRANIECTOMY FOR DEPRESSED SKULL FRACTURE  2009   with MRSA infection  . CRANIOTOMY     X 2  . HERNIA REPAIR     during childhood  . ORIF ANKLE FRACTURE Left 2012     Patient seen this date for focus on manipulation skills to pick up checkers from flat surface and place into grid with use of right hand.  Patient required cues occasionally to use right hand versus left and after 2 cues, he was able to self correct if he started to reach with his left hand.  Cues for prehension patterns to pick up with a tip to tip pattern and work towards turning or flipping to place into grid.  Cues at times to attend to right side visually to detect patterns with efforts to place 4 items in a row, horizontally, diagonally or vertically.  Patient dropped 3 items during  session and was able to retrieve from the floor with min guard for balance.    Patient seen for manipulation of small grooved pegs, requires glasses to complete task.  Difficulty at times with the smaller objects and turning in his hand to place into the board correctly.  Patient requires cues at times, difficulty with picking up the items from the "cup" of the board and often requires therapist to hand object to him.   Response to tx: Patient continues to work towards improved hand function on the right with emphasis on picking up objects of varying sizes, weights and shapes and with manipulation skills to turn, use the hand for storage and translatory skills of the hand.  He continues to drop items occasionally and is able to retrieve them but has poor balance at times and requires min guard to supervision.  Patient responds well to verbal and tactile cues and is showing signs of internal cues at times with initiation of task and use of right hand.  Continue to work towards goals in plan of care to maximize safety and independence in necessary daily tasks.  OT Long Term Goals - 01/08/20 1038      OT LONG TERM GOAL #1   Title Patient will demonstrate use of utensil in right hand for self feeding with modified independence with little to no spillage.     Baseline unable at eval to hold utensil, 10th visit holding utensil, some feeding. 20th visit, feeding self consistently but still has some moderate spillage    Time 12    Period Weeks    Status On-going    Target Date 04/03/20      OT LONG TERM GOAL #2   Title Pt will increase hand strength in right by 5# to be able to cut food with modified independence.    Baseline unable to cut meat at evaluation, 10th visit starting to work on cutting meat, patient reports difficulty with cutting meat    Time 12    Period Weeks    Status Achieved      OT LONG TERM GOAL #3   Title Patient will  demonstrate increased awareness and strategies to attend to right side to hold items without dropping items.    Baseline difficulty with detecting items in right hand at eval, forgets items are in hand, 10th visit improved but still working on 12-30-2019: minimal dropping of items this date.  20th visit continued dropping of items occasionally but with increased awareness    Time 12    Period Weeks    Status On-going    Target Date 04/03/20      OT LONG TERM GOAL #4   Title Patient will demonstrate independence in home exercise program for ROM, strength and coordination of right hand.    Baseline eval-patient has not been performing exercises from last episode of care and demonstrates decline, 10th visit doing exercises at home and continue to modify program. 20th visit pt continues to engage in HEP with upgrades frequently    Time 12    Period Weeks    Status Partially Met    Target Date 04/03/20      OT LONG TERM GOAL #6   Title Patient will demonstrate pouring a drink with the right hand with attention to right side and not knocking the cup over and spilling.      Baseline unable at eval, 10th visit starting to use right hand more and attempting to pour with right.  20th visit still has difficulty at times.    Time 12    Period Weeks    Status On-going    Target Date 04/03/20                 Plan - 01/21/20 2144    Clinical Impression Statement Patient continues to work towards improved hand function on the right with emphasis on picking up objects of varying sizes, weights and shapes and with manipulation skills to turn, use the hand for storage and translatory skills of the hand.  He continues to drop items occasionally and is able to retrieve them but has poor balance at times and requires min guard to supervision.  Patient responds well to verbal and tactile cues and is showing signs of internal cues at times with initiation of task and use of right hand.  Continue to work  towards goals in plan of care to maximize safety and independence in necessary daily tasks.    OT Occupational Profile and History Detailed Assessment- Review of Records and additional review of physical, cognitive, psychosocial history related to current functional performance  Occupational Profile and client history currently impacting functional performance repeated falls, TBI, expressive aphasia, limited motion in right hand, lack of coordination from previous CVA    Occupational performance deficits (Please refer to evaluation for details): ADL's;IADL's;Leisure    Body Structure / Function / Physical Skills ADL;Dexterity;Flexibility;ROM;Strength;Vision;Balance;Coordination;FMC;IADL;Tone;Endurance;UE functional use;Mobility    Cognitive Skills Attention;Safety Awareness;Problem Solve    Psychosocial Skills Environmental  Adaptations;Routines and Behaviors;Habits;Interpersonal Interaction    Rehab Potential Good    Clinical Decision Making Several treatment options, min-mod task modification necessary    Comorbidities Affecting Occupational Performance: May have comorbidities impacting occupational performance    Modification or Assistance to Complete Evaluation  Min-Moderate modification of tasks or assist with assess necessary to complete eval    OT Frequency 2x / week    OT Duration 12 weeks    OT Treatment/Interventions Self-care/ADL training;Visual/perceptual remediation/compensation;DME and/or AE instruction;Patient/family education;Moist Heat;Therapeutic exercise;Manual Therapy;Therapeutic activities;Neuromuscular education    Consulted and Agree with Plan of Care Patient    Family Member Consulted wife, [name]           Patient will benefit from skilled therapeutic intervention in order to improve the following deficits and impairments:   Body Structure / Function / Physical Skills: ADL, Dexterity, Flexibility, ROM, Strength, Vision, Balance, Coordination, FMC, IADL, Tone,  Endurance, UE functional use, Mobility Cognitive Skills: Attention, Safety Awareness, Problem Solve Psychosocial Skills: Environmental  Adaptations, Routines and Behaviors, Habits, Interpersonal Interaction   Visit Diagnosis: Muscle weakness (generalized)  Other lack of coordination  Neurologic neglect syndrome    Problem List Patient Active Problem List   Diagnosis Date Noted  . Sleep disturbance   . PAF (paroxysmal atrial fibrillation) ([place])   . Recurrent UTI   . Hypokalemia   . Hyponatremia   . Abnormal urine   . 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 Benign essential HTN   . Urinary retention   . History of gout   . Global aphasia   . SAH (subarachnoid hemorrhage) ([place])   . SDH (subdural hematoma) ([place])   . Lymphocytosis   . Subdural hemorrhage following injury [facility]) 05/12/2016   Laikyn Gewirtz T Cherrell Maybee, OTR/L, CLT  Thom Ollinger 01/22/2020, 9:41 AM  [place] MAIN [facility] SERVICES [address] [place], [place], [address] Phone: [phone]   Fax:  [phone]  Name: Naveen Clardy MRN: [id number] Date of Birth: [birth year]

## 2020-01-22 ENCOUNTER — Other Ambulatory Visit: Payer: Self-pay

## 2020-01-22 ENCOUNTER — Encounter: Payer: Self-pay | Admitting: Occupational Therapy

## 2020-01-22 ENCOUNTER — Ambulatory Visit: Payer: PPO | Admitting: Occupational Therapy

## 2020-01-22 DIAGNOSIS — R278 Other lack of coordination: Secondary | ICD-10-CM

## 2020-01-22 DIAGNOSIS — R414 Neurologic neglect syndrome: Secondary | ICD-10-CM

## 2020-01-22 DIAGNOSIS — M6281 Muscle weakness (generalized): Secondary | ICD-10-CM

## 2020-01-22 NOTE — Therapy (Signed)
[place] MAIN [facility] SERVICES [address] [place], [place], [address] Phone: [phone]   Fax:  [phone]  Occupational Therapy Treatment  Patient Details  Name: James [name] MRN: [id number] Date of Birth: Jan 10, 1941 Referring Provider (OT): Joselyn Arrow   Encounter Date: 01/22/2020   OT End of Session - 01/22/20 1111    Visit Number 25    Number of Visits 48    Date for OT Re-Evaluation 04/03/20    OT Start Time 1101    OT Stop Time 1146    OT Time Calculation (min) 45 min    Activity Tolerance Patient tolerated treatment well    Behavior During Therapy [facility] for tasks assessed/performed           Past Medical History:  Diagnosis Date  . Alcohol abuse, in remission   . Atrial fibrillation ([place])   . Bilateral renal masses   . Closed right ankle fracture   . 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 Gait disorder   . Gout   . Hypertension   . Seizure disorder ([place])   . 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     Past Surgical History:  Procedure Laterality Date  . CRANIECTOMY FOR DEPRESSED SKULL FRACTURE  2009   with MRSA infection  . CRANIOTOMY     X 2  . HERNIA REPAIR     during childhood  . ORIF ANKLE FRACTURE Left 2012    There were no vitals filed for this visit.   Subjective Assessment - 01/22/20 1107    Subjective  Pt reports he has had a good week so far with going to his new day program at [facility], wife also indicating this helps to give her a respite during the day.    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  He suffered another fall in 2017.  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     Patient Stated Goals Patient indicates he wants to be stronger, use his right hand for daily tasks, be as independent as  possible.    Currently in Pain? No/denies    Multiple Pain Sites No           Neuro: Patient seen for visual scanning to right to seek out items in numerical order. Unable to pick up flat object from the floor with right hand. Dropped 5 items with this activity. Patient picking up small glass beads with right hand, some flat side up, others flat side down.  Patient has difficulty with picking up objects when placed flat side down, increased cues, efforts and attempts.  If the object is flat side up, patient is able to pick up with greater ease.     Response to tx: Continuing to work towards manipulation of objects, prehension patterns with attempts to move towards translatory skills of the hand and using hand for storage.  Patient has improved with modulation of graded pressure when manipulating objects and has more awareness with the type and amount of force required to pick up objects.  Patient with improved right sided awareness this date with scanning and finding specific items on right side.  Continue OT to improve right hand function to impact his daily routine, ADLs and IADLs.  Patient now attending an  adult day program 3 days a week.                    OT Education - 01/22/20 1110    Education provided Yes    Education Details right sided attention    Person(s) Educated Patient;Spouse    Methods Explanation;Demonstration;Verbal cues    Comprehension Verbalized understanding;Returned demonstration;Verbal cues required               OT Long Term Goals - 01/08/20 1038      OT LONG TERM GOAL #1   Title Patient will demonstrate use of utensil in right hand for self feeding with modified independence with little to no spillage.     Baseline unable at eval to hold utensil, 10th visit holding utensil, some feeding. 20th visit, feeding self consistently but still has some moderate spillage    Time 12    Period Weeks    Status On-going    Target Date 04/03/20       OT LONG TERM GOAL #2   Title Pt will increase hand strength in right by 5# to be able to cut food with modified independence.    Baseline unable to cut meat at evaluation, 10th visit starting to work on cutting meat, patient reports difficulty with cutting meat    Time 12    Period Weeks    Status Achieved      OT LONG TERM GOAL #3   Title Patient will demonstrate increased awareness and strategies to attend to right side to hold items without dropping items.    Baseline difficulty with detecting items in right hand at eval, forgets items are in hand, 10th visit improved but still working on 12-30-2019: minimal dropping of items this date.  20th visit continued dropping of items occasionally but with increased awareness    Time 12    Period Weeks    Status On-going    Target Date 04/03/20      OT LONG TERM GOAL #4   Title Patient will demonstrate independence in home exercise program for ROM, strength and coordination of right hand.    Baseline eval-patient has not been performing exercises from last episode of care and demonstrates decline, 10th visit doing exercises at home and continue to modify program. 20th visit pt continues to engage in HEP with upgrades frequently    Time 12    Period Weeks    Status Partially Met    Target Date 04/03/20      OT LONG TERM GOAL #6   Title Patient will demonstrate pouring a drink with the right hand with attention to right side and not knocking the cup over and spilling.      Baseline unable at eval, 10th visit starting to use right hand more and attempting to pour with right.  20th visit still has difficulty at times.    Time 12    Period Weeks    Status On-going    Target Date 04/03/20                 Plan - 01/22/20 1111    Clinical Impression Statement Continuing to work towards manipulation of objects, prehension patterns with attempts to move towards translatory skills of the hand and using hand for storage.  Patient has improved  with modulation of graded pressure when manipulating objects and has more awareness with the type and amount of force required to pick up objects.  Patient with improved right sided  awareness this date with scanning and finding specific items on right side.  Continue OT to improve right hand function to impact his daily routine, ADLs and IADLs.  Patient now attending an adult day program 3 days a week.    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    Occupational performance deficits (Please refer to evaluation for details): ADL's;IADL's;Leisure    Body Structure / Function / Physical Skills ADL;Dexterity;Flexibility;ROM;Strength;Vision;Balance;Coordination;FMC;IADL;Tone;Endurance;UE functional use;Mobility    Cognitive Skills Attention;Safety Awareness;Problem Solve    Psychosocial Skills Environmental  Adaptations;Routines and Behaviors;Habits;Interpersonal Interaction    Rehab Potential Good    Clinical Decision Making Several treatment options, min-mod task modification necessary    Comorbidities Affecting Occupational Performance: May have comorbidities impacting occupational performance    Modification or Assistance to Complete Evaluation  Min-Moderate modification of tasks or assist with assess necessary to complete eval    OT Frequency 2x / week    OT Duration 12 weeks    OT Treatment/Interventions Self-care/ADL training;Visual/perceptual remediation/compensation;DME and/or AE instruction;Patient/family education;Moist Heat;Therapeutic exercise;Manual Therapy;Therapeutic activities;Neuromuscular education    Consulted and Agree with Plan of Care Patient    Family  Member Consulted wife, [name]           Patient will benefit from skilled therapeutic intervention in order to improve the following deficits and impairments:   Body Structure / Function / Physical Skills: ADL, Dexterity, Flexibility, ROM, Strength, Vision, Balance, Coordination, FMC, IADL, Tone, Endurance, UE functional use, Mobility Cognitive Skills: Attention, Safety Awareness, Problem Solve Psychosocial Skills: Environmental  Adaptations, Routines and Behaviors, Habits, Interpersonal Interaction   Visit Diagnosis: Muscle weakness (generalized)  Other lack of coordination  Neurologic neglect syndrome    Problem List Patient Active Problem List   Diagnosis Date Noted  . Sleep disturbance   . PAF (paroxysmal atrial fibrillation) ([place])   . Recurrent UTI   . Hypokalemia   . Hyponatremia   . Abnormal urine   . 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 Benign essential HTN   . Urinary retention   . History of gout   . Global aphasia   . SAH (subarachnoid hemorrhage) ([place])   . SDH (subdural hematoma) ([place])   . Lymphocytosis   . Subdural hemorrhage following injury [facility]) 05/12/2016   Taia Bramlett T Kenshawn Maciolek, OTR/L, CLT  Lyberti Thrush 01/22/2020, 3:41 PM  [place] MAIN [facility] SERVICES [address] [place], [place], [address] Phone: [phone]   Fax:  [phone]  Name: James [name] MRN: [id number] Date of Birth: Aug 17, 1940

## 2020-01-29 ENCOUNTER — Other Ambulatory Visit: Payer: Self-pay

## 2020-01-29 ENCOUNTER — Encounter: Payer: Self-pay | Admitting: Occupational Therapy

## 2020-01-29 ENCOUNTER — Ambulatory Visit: Payer: PPO | Attending: Neurology | Admitting: Occupational Therapy

## 2020-01-29 DIAGNOSIS — R2681 Unsteadiness on feet: Secondary | ICD-10-CM | POA: Diagnosis not present

## 2020-01-29 DIAGNOSIS — M6281 Muscle weakness (generalized): Secondary | ICD-10-CM | POA: Diagnosis not present

## 2020-01-29 DIAGNOSIS — R414 Neurologic neglect syndrome: Secondary | ICD-10-CM | POA: Diagnosis not present

## 2020-01-29 DIAGNOSIS — R278 Other lack of coordination: Secondary | ICD-10-CM | POA: Diagnosis not present

## 2020-01-29 NOTE — Therapy (Signed)
[place] MAIN [facility] SERVICES [address] [place], [place], [address] Phone: [phone]   Fax:  [phone]  Occupational Therapy Treatment  Patient Details  Name: James [name] MRN: [id number] Date of Birth: 1940/12/17 Referring Provider (OT): Joselyn Arrow   Encounter Date: 01/29/2020   OT End of Session - 01/31/20 0834    Visit Number 26    Number of Visits 48    Date for OT Re-Evaluation 04/03/20    OT Start Time 1058    OT Stop Time 1145    OT Time Calculation (min) 47 min    Activity Tolerance Patient tolerated treatment well    Behavior During Therapy [facility] for tasks assessed/performed           Past Medical History:  Diagnosis Date  . Alcohol abuse, in remission   . Atrial fibrillation ([place])   . Bilateral renal masses   . Closed right ankle fracture   . 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 Gait disorder   . Gout   . Hypertension   . Seizure disorder ([place])   . 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     Past Surgical History:  Procedure Laterality Date  . CRANIECTOMY FOR DEPRESSED SKULL FRACTURE  2009   with MRSA infection  . CRANIOTOMY     X 2  . HERNIA REPAIR     during childhood  . ORIF ANKLE FRACTURE Left 2012    There were no vitals filed for this visit.   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  He suffered another fall in 2017.  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     Patient Stated Goals Patient indicates he wants to be stronger, use his right hand for daily tasks, be as independent as possible.    Currently  in Pain? No/denies    Multiple Pain Sites No          Patient seen this date for focus on right sided attention, scanning and fine motor coordination tasks with use of large [employer] and design copy pattern of a more complex design.  Patient required entire session to complete design, perform in seated position and dropping items frequently onto the floor.  Patient requires min guard while attempting to retrieve items from floor due to decreased balance.  Patient with recent fall at home with some mild abrasions this date on bilateral elbows.  Wife also reports patient bumped into something on his right side and also resulted in additional abrasions.  Patient did require cues initially for right hand use.  When focused on getting started he did initiate task with left however when cued with right 2-3 times, he was able to self correct for the rest of the session if he attempted to use left hand first.  Patient able to follow design with increased time allowed to complete task, often has to look back and compare pattern to board multiple times.  Patient able to identify errors and correct without prompts.     Response to tx:  Patient  with recent fall at home, he remains hesitant to resume PT and only wants to work with OT at this time despite recommendations for PT for balance.  Will plan to incorporate more tasks in standing with OT tasks to continue to work on balance during OT sessions.  Patient able to complete complex design copy this date within treatment session, cues only for use of right hand.  Demonstrating increased awareness on right at times and was able to self correct any errors this date.  Continues to drop items frequently with decreased awareness in right hand. Continue to work towards goals to improve independence and safety with tasks at home and in the community.  He continues to enjoy the day program at [facility] and is doing well currently with that program.                        OT Education - 01/31/20 0833    Education provided Yes    Education Details right sided attention, FMC    Person(s) Educated Patient    Methods Explanation;Demonstration;Verbal cues    Comprehension Verbalized understanding;Returned demonstration;Verbal cues required               OT Long Term Goals - 01/08/20 1038      OT LONG TERM GOAL #1   Title Patient will demonstrate use of utensil in right hand for self feeding with modified independence with little to no spillage.     Baseline unable at eval to hold utensil, 10th visit holding utensil, some feeding. 20th visit, feeding self consistently but still has some moderate spillage    Time 12    Period Weeks    Status On-going    Target Date 04/03/20      OT LONG TERM GOAL #2   Title Pt will increase hand strength in right by 5# to be able to cut food with modified independence.    Baseline unable to cut meat at evaluation, 10th visit starting to work on cutting meat, patient reports difficulty with cutting meat    Time 12    Period Weeks    Status Achieved      OT LONG TERM GOAL #3   Title Patient will demonstrate increased awareness and strategies to attend to right side to hold items without dropping items.    Baseline difficulty with detecting items in right hand at eval, forgets items are in hand, 10th visit improved but still working on 12-30-2019: minimal dropping of items this date.  20th visit continued dropping of items occasionally but with increased awareness    Time 12    Period Weeks    Status On-going    Target Date 04/03/20      OT LONG TERM GOAL #4   Title Patient will demonstrate independence in home exercise program for ROM, strength and coordination of right hand.    Baseline eval-patient has not been performing exercises from last episode of care and demonstrates decline, 10th visit doing exercises at home and continue to modify program. 20th visit pt continues to  engage in HEP with upgrades frequently    Time 12    Period Weeks    Status Partially Met    Target Date 04/03/20      OT LONG TERM GOAL #6   Title Patient will demonstrate pouring a drink with the right hand with attention to right side and not knocking the cup over and spilling.        Baseline unable at eval, 10th visit starting to use right hand more and attempting to pour with right.  20th visit still has difficulty at times.    Time 12    Period Weeks    Status On-going    Target Date 04/03/20                 Plan - 01/31/20 0834    Clinical Impression Statement Patient with recent fall at home, he remains hesitant to resume PT and only wants to work with OT at this time despite recommendations for PT for balance.  Will plan to incorporate more tasks in standing with OT tasks to continue to work on balance during OT sessions.  Patient able to complete complex design copy this date within treatment session, cues only for use of right hand.  Demonstrating increased awareness on right at times and was able to self correct any errors this date.  Continues to drop items frequently with decreased awareness in right hand. Continue to work towards goals to improve independence and safety with tasks at home and in the community.  He continues to enjoy the day program at [facility] and is doing well currently with that program.    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    Occupational performance deficits (Please refer to evaluation for details): ADL's;IADL's;Leisure    Body Structure / Function / Physical Skills  ADL;Dexterity;Flexibility;ROM;Strength;Vision;Balance;Coordination;FMC;IADL;Tone;Endurance;UE functional use;Mobility    Cognitive Skills Attention;Safety Awareness;Problem Solve    Psychosocial Skills Environmental  Adaptations;Routines and Behaviors;Habits;Interpersonal Interaction    Rehab Potential Good    Clinical Decision Making Several treatment options, min-mod task modification necessary    Comorbidities Affecting Occupational Performance: May have comorbidities impacting occupational performance    Modification or Assistance to Complete Evaluation  Min-Moderate modification of tasks or assist with assess necessary to complete eval    OT Frequency 2x / week    OT Duration 12 weeks    OT Treatment/Interventions Self-care/ADL training;Visual/perceptual remediation/compensation;DME and/or AE instruction;Patient/family education;Moist Heat;Therapeutic exercise;Manual Therapy;Therapeutic activities;Neuromuscular education    Consulted and Agree with Plan of Care Patient    Family Member Consulted wife, [name]           Patient will benefit from skilled therapeutic intervention in order to improve the following deficits and impairments:   Body Structure / Function / Physical Skills: ADL, Dexterity, Flexibility, ROM, Strength, Vision, Balance, Coordination, FMC, IADL, Tone, Endurance, UE functional use, Mobility Cognitive Skills: Attention, Safety Awareness, Problem Solve Psychosocial Skills: Environmental  Adaptations, Routines and Behaviors, Habits, Interpersonal Interaction   Visit Diagnosis: Muscle weakness (generalized)  Other lack of coordination  Neurologic neglect syndrome  Unsteadiness on feet    Problem List Patient Active Problem List   Diagnosis Date Noted  . Sleep disturbance   . PAF (paroxysmal atrial fibrillation) ([place])   . Recurrent UTI   . Hypokalemia   . Hyponatremia   . Abnormal urine   . 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 Benign  essential HTN   . Urinary retention   . History of gout   . Global aphasia   . SAH (subarachnoid hemorrhage) ([place])   . SDH (subdural hematoma) ([place])   . Lymphocytosis   .  Subdural hemorrhage following injury ([facility]) 05/12/2016    T , OTR/L, CLT  , 01/31/2020, 8:45 AM  [facility] RE[facility] SERVICES [address] [place], [place], [address] Phone: [phone]   Fax:  [phone]  Name: James [name] MRN: [id number] Date of Birth: 02/27/1941 

## 2020-02-03 ENCOUNTER — Other Ambulatory Visit: Payer: Self-pay

## 2020-02-03 ENCOUNTER — Ambulatory Visit: Payer: PPO | Admitting: Occupational Therapy

## 2020-02-03 DIAGNOSIS — R2681 Unsteadiness on feet: Secondary | ICD-10-CM

## 2020-02-03 DIAGNOSIS — R414 Neurologic neglect syndrome: Secondary | ICD-10-CM

## 2020-02-03 DIAGNOSIS — R278 Other lack of coordination: Secondary | ICD-10-CM

## 2020-02-03 DIAGNOSIS — M6281 Muscle weakness (generalized): Secondary | ICD-10-CM

## 2020-02-05 ENCOUNTER — Ambulatory Visit: Payer: PPO | Admitting: Occupational Therapy

## 2020-02-05 ENCOUNTER — Encounter: Payer: Self-pay | Admitting: Occupational Therapy

## 2020-02-05 ENCOUNTER — Other Ambulatory Visit: Payer: Self-pay

## 2020-02-05 DIAGNOSIS — R2681 Unsteadiness on feet: Secondary | ICD-10-CM

## 2020-02-05 DIAGNOSIS — M6281 Muscle weakness (generalized): Secondary | ICD-10-CM

## 2020-02-05 DIAGNOSIS — R414 Neurologic neglect syndrome: Secondary | ICD-10-CM

## 2020-02-05 DIAGNOSIS — R278 Other lack of coordination: Secondary | ICD-10-CM

## 2020-02-05 NOTE — Therapy (Signed)
[place] [facility] [address], [place], [address] Phone: [phone]   Fax:  [phone]  Occupational Therapy Treatment  Patient Details  Name: James [name] MRN: [id number] Date of Birth: [birth year] Referring Provider (OT): Joselyn Arrow   Encounter Date: 02/03/2020   OT End of Session - 02/05/20 1128    Visit Number 27    Number of Visits 48    Date for OT Re-Evaluation 04/03/20    OT Start Time 1030    OT Stop Time 1120    OT Time Calculation (min) 50 min    Activity Tolerance Patient tolerated treatment well    Behavior During Therapy [facility] for tasks assessed/performed           Past Medical History:  Diagnosis Date  . Alcohol abuse, in remission   . Atrial fibrillation ([place])   . Bilateral renal masses   . Closed right ankle fracture   . 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 Gait disorder   . Gout   . Hypertension   . Seizure disorder ([place])   . 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     Past Surgical History:  Procedure Laterality Date  . CRANIECTOMY FOR DEPRESSED SKULL FRACTURE  2009   with MRSA infection  . CRANIOTOMY     X 2  . HERNIA REPAIR     during childhood  . ORIF ANKLE FRACTURE Left 2012    There were no vitals filed for this visit.   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  He suffered another fall in 2017.  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     Patient Stated Goals Patient indicates he wants to be stronger, use his right hand for daily tasks, be as independent as possible.    Currently in Pain? No/denies    Multiple Pain Sites No           Patient seen for fluidotherapy, 10  mins to right hand to decrease stiffness and increase ROM while performing exercises for thumb radial and palmar abduction while in fluido.  Following fluido, patient for AAROM/AROM for right hand flexion/extension, thumb flexion/extension and radial/palmar ABD, cues and guiding from therapist through the movements. Patient engaging in fine motor coordination skills with use of 1/2 inch objects to pick up and place into grid, cues for prehension patterns for tip to tip.  Patient dropping 5 items this date.  Difficulty with manipulation skills to turn objects to correct angle to place.  Improved with removing items rather than placing.  Patient unable to perform using the hand for storage with small items.   Handwriting skills with right dominant hand, use of small lined paper and gel pen for first and last name for multiple trials.  Cues for readjustment of hand grip, tripod grasp.   Response to tx: Patient responded well to heat from fluidotherapy prior to ROM exercises, improved thumb ROM when performing tasks.  Patient continues to require cues for prehension patterns and demonstrates difficulty with manipulation of small objects.  Patient continues to drop items frequently with decreased awareness and sensation in right hand.  Focused on  handwriting in smaller lined paper and use of gel pen.  Legibility remains limited but is slowly improving, increased control with pen this date.  Continue to work towards goals to improve right sided attention and functional hand use for daily tasks.                    OT Education - 02/05/20 1113    Education provided Yes    Education Details right sided attention, [facility]    Person(s) Educated Patient    Methods Explanation;Demonstration;Verbal cues    Comprehension Verbalized understanding;Returned demonstration;Verbal cues required               OT Long Term Goals - 01/08/20 1038      OT LONG TERM GOAL #1   Title Patient will  demonstrate use of utensil in right hand for self feeding with modified independence with little to no spillage.     Baseline unable at eval to hold utensil, 10th visit holding utensil, some feeding. 20th visit, feeding self consistently but still has some moderate spillage    Time 12    Period Weeks    Status On-going    Target Date 04/03/20      OT LONG TERM GOAL #2   Title Pt will increase hand strength in right by 5# to be able to cut food with modified independence.    Baseline unable to cut meat at evaluation, 10th visit starting to work on cutting meat, patient reports difficulty with cutting meat    Time 12    Period Weeks    Status Achieved      OT LONG TERM GOAL #3   Title Patient will demonstrate increased awareness and strategies to attend to right side to hold items without dropping items.    Baseline difficulty with detecting items in right hand at eval, forgets items are in hand, 10th visit improved but still working on 12-30-2019: minimal dropping of items this date.  20th visit continued dropping of items occasionally but with increased awareness    Time 12    Period Weeks    Status On-going    Target Date 04/03/20      OT LONG TERM GOAL #4   Title Patient will demonstrate independence in home exercise program for ROM, strength and coordination of right hand.    Baseline eval-patient has not been performing exercises from last episode of care and demonstrates decline, 10th visit doing exercises at home and continue to modify program. 20th visit pt continues to engage in HEP with upgrades frequently    Time 12    Period Weeks    Status Partially Met    Target Date 04/03/20      OT LONG TERM GOAL #6   Title Patient will demonstrate pouring a drink with the right hand with attention to right side and not knocking the cup over and spilling.      Baseline unable at eval, 10th visit starting to use right hand more and attempting to pour with right.  20th visit still has  difficulty at times.    Time 12    Period Weeks    Status On-going    Target Date 04/03/20                 Plan - 02/05/20 1129    Clinical Impression Statement Patient responded well to heat from fluidotherapy prior to ROM exercises, improved thumb ROM when performing tasks.  Patient continues to require  cues for prehension patterns and demonstrates difficulty with manipulation of small objects.  Patient continues to drop items frequently with decreased awareness and sensation in right hand.  Focused on handwriting in smaller lined paper and use of gel pen.  Legibility remains limited but is slowly improving, increased control with pen this date.  Continue to work towards goals to improve right sided attention and functional hand use for daily tasks.    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    Occupational performance deficits (Please refer to evaluation for details): ADL's;IADL's;Leisure    Body Structure / Function / Physical Skills ADL;Dexterity;Flexibility;ROM;Strength;Vision;Balance;Coordination;FMC;IADL;Tone;Endurance;UE functional use;Mobility    Cognitive Skills Attention;Safety Awareness;Problem Solve    Psychosocial Skills Environmental  Adaptations;Routines and Behaviors;Habits;Interpersonal Interaction    Rehab Potential Good    Clinical Decision Making Several treatment options, min-mod task modification necessary    Comorbidities Affecting Occupational Performance: May have comorbidities impacting occupational performance    Modification or Assistance to Complete Evaluation  Min-Moderate modification of tasks or assist with assess necessary  to complete eval    OT Frequency 2x / week    OT Duration 12 weeks    OT Treatment/Interventions Self-care/ADL training;Visual/perceptual remediation/compensation;DME and/or AE instruction;Patient/family education;Moist Heat;Therapeutic exercise;Manual Therapy;Therapeutic activities;Neuromuscular education    Consulted and Agree with Plan of Care Patient    Family Member Consulted wife, [name]           Patient will benefit from skilled therapeutic intervention in order to improve the following deficits and impairments:   Body Structure / Function / Physical Skills: ADL, Dexterity, Flexibility, ROM, Strength, Vision, Balance, Coordination, FMC, IADL, Tone, Endurance, UE functional use, Mobility Cognitive Skills: Attention, Safety Awareness, Problem Solve Psychosocial Skills: Environmental  Adaptations, Routines and Behaviors, Habits, Interpersonal Interaction   Visit Diagnosis: Muscle weakness (generalized)  Other lack of coordination  Neurologic neglect syndrome  Unsteadiness on feet    Problem List Patient Active Problem List   Diagnosis Date Noted  . Sleep disturbance   . PAF (paroxysmal atrial fibrillation) ([place])   . Recurrent UTI   . Hypokalemia   . Hyponatremia   . Abnormal urine   . 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 Benign essential HTN   . Urinary retention   . History of gout   . Global aphasia   . SAH (subarachnoid hemorrhage) ([place])   . SDH (subdural hematoma) ([place])   . Lymphocytosis   . Subdural hemorrhage following injury ([place]) 05/12/2016   James [name] T Charly Holcomb, OTR/L, CLT  James [name] 02/05/2020, 11:49 AM  [place] [facility] [address] [place], [address] Phone: [phone]   Fax:  [phone]  Name: James [name] MRN: [id number] Date of Birth: May 12, 1941

## 2020-02-05 NOTE — Therapy (Signed)
[place] [address] [place], [address] Phone: [phone]   Fax:  [phone]  Occupational Therapy Treatment  Patient Details  Name: James [name] MRN: [id number] Date of Birth: [birth year] Referring Provider (OT): Joselyn Arrow   Encounter Date: 02/05/2020   OT End of Session - 02/05/20 2059    Visit Number 28    Number of Visits 48    Date for OT Re-Evaluation 04/03/20    OT Start Time 1500    OT Stop Time 1548    OT Time Calculation (min) 48 min    Activity Tolerance Patient tolerated treatment well    Behavior During Therapy [facility] for tasks assessed/performed           Past Medical History:  Diagnosis Date  . Alcohol abuse, in remission   . Atrial fibrillation ([place])   . Bilateral renal masses   . Closed right ankle fracture   . 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 Gait disorder   . Gout   . Hypertension   . Seizure disorder ([place])   . 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     Past Surgical History:  Procedure Laterality Date  . CRANIECTOMY FOR DEPRESSED SKULL FRACTURE  2009   with MRSA infection  . CRANIOTOMY     X 2  . HERNIA REPAIR     during childhood  . ORIF ANKLE FRACTURE Left 2012    There were no vitals filed for this visit.   Subjective Assessment - 02/05/20 2058    Subjective  Patient denies any pain.    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  He suffered another fall in 2017.  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     Patient Stated Goals Patient indicates he wants to be stronger, use his right hand for daily tasks, be as independent as possible.    Currently in Pain? No/denies    Multiple Pain Sites No             Pt denies any more falls, denies any pain.     Patient seen for use of fluidotherapy for 10 mins to decrease inflammation and to increase ROM with exercises during and after fluidotherapy use.  ROM with right hand for finger flexion, extension and thumb movements in all directions.  Use of juxticiser for right hand and wrist for ROM in multiple directions to move metal disc from one side to another.  Multiple repetitions completed. Chinese balls with max difficulty with right hand, able to perform with left hand.  Therapist guiding on right and providing cues for right thumb and finger movements.   [place] discs picking up from container and placing into grid, flipping with right hand.  Moderate difficulty with flipping and requires increased cues, difficulty with index finger movement for isolation.   Finger isolation for "come here" movement. Difficulty with proprioception with movements. Therapist demo and step by step cues for desired motion.     Response to tx:   Patient demonstrated max difficulty with attempts at performance with Chinese balls, able to demonstrate some success with therapist cues and min assist, dropping balls repeatedly.  Patient continues to demonstrate difficulty with isolated finger movements with manipulation of objects.  Patient  responds well to verbal and tactile cues, does have some proprioceptive issues but if task is broken down to step by step he has greater success.  Continue OT towards goals to maximize safety and independence in ADL and IADL tasks.                     OT Education - 02/05/20 2058    Education provided Yes    Education Details isolated finger movements    Person(s) Educated Patient    Methods Explanation;Demonstration;Verbal cues    Comprehension Verbalized understanding;Returned demonstration;Verbal cues required               OT Long Term Goals - 01/08/20 1038      OT LONG TERM GOAL #1   Title Patient will demonstrate use of utensil in right hand for self  feeding with modified independence with little to no spillage.     Baseline unable at eval to hold utensil, 10th visit holding utensil, some feeding. 20th visit, feeding self consistently but still has some moderate spillage    Time 12    Period Weeks    Status On-going    Target Date 04/03/20      OT LONG TERM GOAL #2   Title Pt will increase hand strength in right by 5# to be able to cut food with modified independence.    Baseline unable to cut meat at evaluation, 10th visit starting to work on cutting meat, patient reports difficulty with cutting meat    Time 12    Period Weeks    Status Achieved      OT LONG TERM GOAL #3   Title Patient will demonstrate increased awareness and strategies to attend to right side to hold items without dropping items.    Baseline difficulty with detecting items in right hand at eval, forgets items are in hand, 10th visit improved but still working on 12-30-2019: minimal dropping of items this date.  20th visit continued dropping of items occasionally but with increased awareness    Time 12    Period Weeks    Status On-going    Target Date 04/03/20      OT LONG TERM GOAL #4   Title Patient will demonstrate independence in home exercise program for ROM, strength and coordination of right hand.    Baseline eval-patient has not been performing exercises from last episode of care and demonstrates decline, 10th visit doing exercises at home and continue to modify program. 20th visit pt continues to engage in HEP with upgrades frequently    Time 12    Period Weeks    Status Partially Met    Target Date 04/03/20      OT LONG TERM GOAL #6   Title Patient will demonstrate pouring a drink with the right hand with attention to right side and not knocking the cup over and spilling.      Baseline unable at eval, 10th visit starting to use right hand more and attempting to pour with right.  20th visit still has difficulty at times.    Time 12    Period Weeks     Status On-going    Target Date 04/03/20                 Plan - 02/05/20 2059    Clinical Impression Statement Patient demonstrated max difficulty with attempts at performance with Chinese balls, able to demonstrate some success with therapist cues and min assist, dropping  balls repeatedly.  Patient continues to demonstrate difficulty with isolated finger movements with manipulation of objects.  Patient responds well to verbal and tactile cues, does have some proprioceptive issues but if task is broken down to step by step he has greater success.  Continue OT towards goals to maximize safety and independence in ADL and IADL tasks.    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    Occupational performance deficits (Please refer to evaluation for details): ADL's;IADL's;Leisure    Body Structure / Function / Physical Skills ADL;Dexterity;Flexibility;ROM;Strength;Vision;Balance;Coordination;FMC;IADL;Tone;Endurance;UE functional use;Mobility    Cognitive Skills Attention;Safety Awareness;Problem Solve    Psychosocial Skills Environmental  Adaptations;Routines and Behaviors;Habits;Interpersonal Interaction    Rehab Potential Good    Clinical Decision Making Several treatment options, min-mod task modification necessary    Comorbidities Affecting Occupational Performance: May have comorbidities impacting occupational performance    Modification or Assistance to Complete Evaluation  Min-Moderate modification of tasks or assist with assess necessary to complete eval    OT Frequency 2x / week    OT Duration 12 weeks    OT Treatment/Interventions Self-care/ADL  training;Visual/perceptual remediation/compensation;DME and/or AE instruction;Patient/family education;Moist Heat;Therapeutic exercise;Manual Therapy;Therapeutic activities;Neuromuscular education    Consulted and Agree with Plan of Care Patient    Family Member Consulted wife, [name]           Patient will benefit from skilled therapeutic intervention in order to improve the following deficits and impairments:   Body Structure / Function / Physical Skills: ADL, Dexterity, Flexibility, ROM, Strength, Vision, Balance, Coordination, FMC, IADL, Tone, Endurance, UE functional use, Mobility Cognitive Skills: Attention, Safety Awareness, Problem Solve Psychosocial Skills: Environmental  Adaptations, Routines and Behaviors, Habits, Interpersonal Interaction   Visit Diagnosis: Muscle weakness (generalized)  Other lack of coordination  Neurologic neglect syndrome  Unsteadiness on feet    Problem List Patient Active Problem List   Diagnosis Date Noted  . Sleep disturbance   . PAF (paroxysmal atrial fibrillation) ([place])   . Recurrent UTI   . Hypokalemia   . Hyponatremia   . Abnormal urine   . 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 Benign essential HTN   . Urinary retention   . History of gout   . Global aphasia   . SAH (subarachnoid hemorrhage) ([place])   . SDH (subdural hematoma) ([place])   . Lymphocytosis   . Subdural hemorrhage following injury ([place]) 05/12/2016   Emmer Lillibridge T Adhira Jamil, OTR/L, CLT  Noraa Pickeral 02/05/2020, 9:02 PM  [facility] [address] [place], [address] Phone: [phone]   Fax:  [phone]  Name: James [name] MRN: [id number] Date of Birth: 13-Jan-1941

## 2020-02-10 ENCOUNTER — Ambulatory Visit: Payer: PPO | Admitting: Occupational Therapy

## 2020-02-10 ENCOUNTER — Other Ambulatory Visit: Payer: Self-pay

## 2020-02-10 DIAGNOSIS — R414 Neurologic neglect syndrome: Secondary | ICD-10-CM

## 2020-02-10 DIAGNOSIS — R2681 Unsteadiness on feet: Secondary | ICD-10-CM

## 2020-02-10 DIAGNOSIS — M6281 Muscle weakness (generalized): Secondary | ICD-10-CM | POA: Diagnosis not present

## 2020-02-10 DIAGNOSIS — R278 Other lack of coordination: Secondary | ICD-10-CM

## 2020-02-10 NOTE — Therapy (Signed)
[place] MAIN [facility] SERVICES [address] [place], [place], [address] Phone: [phone]   Fax:  [phone]  Occupational Therapy Treatment  Patient Details  Name: James [name] MRN: [id number] Date of Birth: [birth year] Referring Provider (OT): Joselyn Arrow   Encounter Date: 02/10/2020   OT End of Session - 02/10/20 1601    Visit Number 29    Number of Visits 48    Date for OT Re-Evaluation 04/03/20    OT Start Time 1100    OT Stop Time 1146    OT Time Calculation (min) 46 min    Activity Tolerance Patient tolerated treatment well    Behavior During Therapy [facility] for tasks assessed/performed           Past Medical History:  Diagnosis Date  . Alcohol abuse, in remission   . Atrial fibrillation ([place])   . Bilateral renal masses   . Closed right ankle fracture   . 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 Gait disorder   . Gout   . Hypertension   . Seizure disorder ([place])   . 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     Past Surgical History:  Procedure Laterality Date  . CRANIECTOMY FOR DEPRESSED SKULL FRACTURE  2009   with MRSA infection  . CRANIOTOMY     X 2  . HERNIA REPAIR     during childhood  . ORIF ANKLE FRACTURE Left 2012    There were no vitals filed for this visit.   Subjective Assessment - 02/10/20 1559    Subjective  Pt reports he is doing well, no falls in the last week.  Still recovering some with bruises and abrasions from the last fall a couple weeks ago.    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  He suffered another fall in 2017.  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     Patient Stated Goals Patient indicates he wants to be stronger, use his right hand for daily tasks, be as independent as possible.     Currently in Pain? No/denies    Multiple Pain Sites No            Patient seen for strengthening and ROM of RUE with use of UBE for 5 mins from seated position, forwards/backwards and alternating levels of resistance from 2.2 to 3.0 with therapist in constant attendance to ensure grip and to adjust settings.  Patient seen for reaching tasks in multi directions to obtain items 2 inch in size or greater and placing into container.  Cues for weight shifting for reaching, performed in sitting.     Patient seen for coordination tasks with use of Magnet hooks with washers 2 sizes, large and small with attempts to pick up from magnetic bowl with increased difficulty.  Able to pick up items from flat surface by bringing to the edge of the table and cues provided for prehension patterns with thumb and finger.  Patient reaching to place washers onto hooks in elevated plane of motion with use of right hand.  Patient dropping items frequently today, especially smaller washers.     Patient also seen with use of PVC pipe tree to assemble pieces with use of pictures for design copy.  Patient did not require any  cues for the design or to attend to right side visually however he required cues for bilateral hand use, tended to initiate task with left hand.  Required both tactile and verbal cues to use right hand as the lead in task.  Patient able to complete 2 designs with 100% accuracy but required cues for right hand and bilateral hand use throughout task.    Response to tx:   Patient continues to demonstrate difficulty with picking up items from bowl, container, etc with use of right hand.  Performance improved when picking up from flat surface and pulling item to the edge of the table.  Still tends to use a more lateral grip on items rather than a tip to tip pattern and requires cues for pattern as well as use of right hand during task.  Decreased initiation with right hand today and tended to use left hand if not  stopped and redirected.  Pt able to attend to pattern presented to his right side today with 100% accuracy.  Continue to work towards improved initiation of hand use on right and for bilateral tasks without cues.                        OT Education - 02/10/20 1601    Education provided Yes    Education Details [employer], bilateral hand tasks    Methods Explanation;Demonstration;Verbal cues    Comprehension Verbalized understanding;Returned demonstration;Verbal cues required               OT Long Term Goals - 01/08/20 1038      OT LONG TERM GOAL #1   Title Patient will demonstrate use of utensil in right hand for self feeding with modified independence with little to no spillage.     Baseline unable at eval to hold utensil, 10th visit holding utensil, some feeding. 20th visit, feeding self consistently but still has some moderate spillage    Time 12    Period Weeks    Status On-going    Target Date 04/03/20      OT LONG TERM GOAL #2   Title Pt will increase hand strength in right by 5# to be able to cut food with modified independence.    Baseline unable to cut meat at evaluation, 10th visit starting to work on cutting meat, patient reports difficulty with cutting meat    Time 12    Period Weeks    Status Achieved      OT LONG TERM GOAL #3   Title Patient will demonstrate increased awareness and strategies to attend to right side to hold items without dropping items.    Baseline difficulty with detecting items in right hand at eval, forgets items are in hand, 10th visit improved but still working on 12-30-2019: minimal dropping of items this date.  20th visit continued dropping of items occasionally but with increased awareness    Time 12    Period Weeks    Status On-going    Target Date 04/03/20      OT LONG TERM GOAL #4   Title Patient will demonstrate independence in home exercise program for ROM, strength and coordination of right hand.    Baseline eval-patient  has not been performing exercises from last episode of care and demonstrates decline, 10th visit doing exercises at home and continue to modify program. 20th visit pt continues to engage in HEP with upgrades frequently    Time 12    Period Weeks  Status Partially Met    Target Date 04/03/20      OT LONG TERM GOAL #6   Title Patient will demonstrate pouring a drink with the right hand with attention to right side and not knocking the cup over and spilling.      Baseline unable at eval, 10th visit starting to use right hand more and attempting to pour with right.  20th visit still has difficulty at times.    Time 12    Period Weeks    Status On-going    Target Date 04/03/20                 Plan - 02/10/20 1601    Clinical Impression Statement Patient continues to demonstrate difficulty with picking up items from bowl, container, etc with use of right hand.  Performance improved when picking up from flat surface and pulling item to the edge of the table.  Still tends to use a more lateral grip on items rather than a tip to tip pattern and requires cues for pattern as well as use of right hand during task.  Decreased initiation with right hand today and tended to use left hand if not stopped and redirected.  Pt able to attend to pattern presented to his right side today with 100% accuracy.  Continue to work towards improved initiation of hand use on right and for bilateral tasks without cues.    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    Occupational performance deficits (Please refer to evaluation for details): ADL's;IADL's;Leisure    Body Structure /  Function / Physical Skills ADL;Dexterity;Flexibility;ROM;Strength;Vision;Balance;Coordination;FMC;IADL;Tone;Endurance;UE functional use;Mobility    Cognitive Skills Attention;Safety Awareness;Problem Solve    Psychosocial Skills Environmental  Adaptations;Routines and Behaviors;Habits;Interpersonal Interaction    Rehab Potential Good    Clinical Decision Making Several treatment options, min-mod task modification necessary    Comorbidities Affecting Occupational Performance: May have comorbidities impacting occupational performance    Modification or Assistance to Complete Evaluation  Min-Moderate modification of tasks or assist with assess necessary to complete eval    OT Frequency 2x / week    OT Duration 12 weeks    OT Treatment/Interventions Self-care/ADL training;Visual/perceptual remediation/compensation;DME and/or AE instruction;Patient/family education;Moist Heat;Therapeutic exercise;Manual Therapy;Therapeutic activities;Neuromuscular education    Consulted and Agree with Plan of Care Patient    Family Member Consulted wife, [name]           Patient will benefit from skilled therapeutic intervention in order to improve the following deficits and impairments:   Body Structure / Function / Physical Skills: ADL, Dexterity, Flexibility, ROM, Strength, Vision, Balance, Coordination, FMC, IADL, Tone, Endurance, UE functional use, Mobility Cognitive Skills: Attention, Safety Awareness, Problem Solve Psychosocial Skills: Environmental  Adaptations, Routines and Behaviors, Habits, Interpersonal Interaction   Visit Diagnosis: Other lack of coordination  Muscle weakness (generalized)  Neurologic neglect syndrome  Unsteadiness on feet    Problem List Patient Active Problem List   Diagnosis Date Noted  . Sleep disturbance   . PAF (paroxysmal atrial fibrillation) ([place])   . Recurrent UTI   . Hypokalemia   . Hyponatremia   . Abnormal urine   . 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 Benign essential HTN   .  Urinary retention   . History of gout   . Global aphasia   . SAH (subarachnoid hemorrhage) ([place])   . SDH (subdural hematoma) ([place])   . Lymphocytosis   . Subdural hemorrhage following injury ([place]) 05/12/2016   Reena Borromeo T Leone Mobley, OTR/L, CLT  Rosalio Catterton 02/10/2020, 4:14 PM  [place] MAIN [facility] SERVICES [address] [place], [place], [address] Phone: [phone]   Fax:  [phone]  Name: James [name] MRN: [id number] Date of Birth: [birth year]

## 2020-02-12 ENCOUNTER — Ambulatory Visit: Payer: PPO | Admitting: Occupational Therapy

## 2020-02-12 ENCOUNTER — Other Ambulatory Visit: Payer: Self-pay

## 2020-02-12 ENCOUNTER — Encounter: Payer: Self-pay | Admitting: Occupational Therapy

## 2020-02-12 DIAGNOSIS — R414 Neurologic neglect syndrome: Secondary | ICD-10-CM

## 2020-02-12 DIAGNOSIS — R278 Other lack of coordination: Secondary | ICD-10-CM

## 2020-02-12 DIAGNOSIS — M6281 Muscle weakness (generalized): Secondary | ICD-10-CM

## 2020-02-12 DIAGNOSIS — R2681 Unsteadiness on feet: Secondary | ICD-10-CM

## 2020-02-12 NOTE — Therapy (Addendum)
[place] [facility] [address] [place], [address] Phone: [phone]   Fax:  [phone]  Occupational Therapy Treatment/Progress Update Reporting period from 12/30/2019 to 02/12/2020  Patient Details  Name: James [name] MRN: [id number] Date of Birth: 14-Jul-1941 Referring Provider (OT): Steele Sizer   Encounter Date: 02/12/2020   OT End of Session - 02/12/20 1106    Visit Number 30    Number of Visits 48    Date for OT Re-Evaluation 04/03/20    OT Start Time 1058    OT Stop Time 1151    OT Time Calculation (min) 53 min    Activity Tolerance Patient tolerated treatment well    Behavior During Therapy [facility] for tasks assessed/performed           Past Medical History:  Diagnosis Date  . Alcohol abuse, in remission   . Atrial fibrillation ([facility])   . Bilateral renal masses   . Closed right ankle fracture   . 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 Gait disorder   . Gout   . Hypertension   . Seizure disorder ([facility])   . 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     Past Surgical History:  Procedure Laterality Date  . CRANIECTOMY FOR DEPRESSED SKULL FRACTURE  2009   with MRSA infection  . CRANIOTOMY     X 2  . HERNIA REPAIR     during childhood  . ORIF ANKLE FRACTURE Left 2012    There were no vitals filed for this visit.   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  He suffered another fall in 2017.  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     Patient Stated Goals Patient indicates he wants to be stronger, use his right hand for daily tasks, be as independent as  possible.    Currently in Pain? No/denies    Multiple Pain Sites No          Patient seen for use of fluidotherapy for 10 mins to right hand and wrist while performing AROM exercises to prepare hand for use in functional tasks.  Patient seen for use of Chinese balls for coordination, able to demonstrate in left hand, therapist assist and cues to work on motion in right hand, increased cues for thumb use.  Switched to one ball and attempting to use thumb to move ball in multi directions with therapist assist as needed.   Spring loaded scissors for right hand to cut out a variety of lines, straight, curvy, wavy and zigzag, assist at times to readjust fingers on scissors.  Use of [place] dexterity test Unable to place the small pieces into board with right hand, placed into board with left hand and then using tweezers to remove.   Difficulty with tweezers but able to do with moderate cues and modeling and improved with control after multiple trials and reps  Instructed patient and wife regarding use of ball to work on thumb use for coordination.      Response to tx: Goals updated this date to reflect progress.  Wife reports patient  is now using utensils without foam and self feeding more than 50% of meal with right hand with little to no spillage.  Patient also using right hand more for tasks at home and is able to circle items on his menu when at the adult day center.   Dressing self with modified independence.  Graded grasping patterns improved with increased ability to self modulate when attempting to grasp items the majority of time.  Patient has experienced some falls and has not wanted to work with PT on balance again.  Patient required assist with use of chinese balls for dexterity and coordination skills today but is motivated to work on task and wife is considering buying a set for home.  Patient continues to benefit from skilled OT services to maximize safety and independence in daily  tasks.                        OT Education - 02/12/20 1106    Education provided Yes    Education Details [place], bilateral hand tasks    Person(s) Educated Patient    Methods Explanation;Demonstration;Verbal cues    Comprehension Verbalized understanding;Returned demonstration;Verbal cues required               OT Long Term Goals - 02/12/20 1107      OT LONG TERM GOAL #1   Title Patient will demonstrate use of utensil in right hand for self feeding with modified independence with little to no spillage.     Baseline unable at eval to hold utensil, 10th visit holding utensil, some feeding. 20th visit, feeding self consistently but still has some moderate spillage    Time 12    Period Weeks    Status Achieved      OT LONG TERM GOAL #2   Title Pt will increase hand strength in right by 5# to be able to cut food with modified independence.    Baseline unable to cut meat at evaluation, 10th visit starting to work on cutting meat, patient reports difficulty with cutting meat    Time 12    Period Weeks    Status Achieved      OT LONG TERM GOAL #3   Title Patient will demonstrate increased awareness and strategies to attend to right side to hold items without dropping items.    Baseline difficulty with detecting items in right hand at eval, forgets items are in hand, 10th visit improved but still working on 12-30-2019: minimal dropping of items this date.  20th visit continued dropping of items occasionally but with increased awareness    Time 12    Period Weeks    Status On-going      OT LONG TERM GOAL #4   Title Patient will demonstrate independence in home exercise program for ROM, strength and coordination of right hand.    Baseline eval-patient has not been performing exercises from last episode of care and demonstrates decline, 10th visit doing exercises at home and continue to modify program. 20th visit pt continues to engage in HEP with upgrades frequently     Time 12    Period Weeks    Status Partially Met      OT LONG TERM GOAL #6   Title Patient will demonstrate pouring a drink with the right hand with attention to right side and not knocking the cup over and spilling.      Baseline unable at eval, 10th visit starting to use right hand  more and attempting to pour with right.  20th visit still has difficulty at times.    Time 12    Period Weeks    Status On-going                 Plan - 02/12/20 1106    Clinical Impression Statement Goals updated this date to reflect progress.  Wife reports patient is now using utensils without foam and self feeding more than 50% of meal with right hand with little to no spillage.  Patient also using right hand more for tasks at home and is able to circle items on his menu when at the adult day center.  Dressing self with modified independence.  Graded grasping patterns improved with increased ability to self modulate when attempting to grasp items the majority of time.  Patient has experienced some falls and has not wanted to work with PT on balance again.  Patient required assist with use of chinese balls for dexterity and coordination skills today but is motivated to work on task and wife is considering buying a set for home.  Patient continues to benefit from skilled OT services to maximize safety and independence in daily tasks.    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    Occupational performance deficits (Please refer to evaluation for details): ADL's;IADL's;Leisure    Body Structure / Function / Physical Skills  ADL;Dexterity;Flexibility;ROM;Strength;Vision;Balance;Coordination;FMC;IADL;Tone;Endurance;UE functional use;Mobility    Cognitive Skills Attention;Safety Awareness;Problem Solve    Psychosocial Skills Environmental  Adaptations;Routines and Behaviors;Habits;Interpersonal Interaction    Rehab Potential Good    Clinical Decision Making Several treatment options, min-mod task modification necessary    Comorbidities Affecting Occupational Performance: May have comorbidities impacting occupational performance    Modification or Assistance to Complete Evaluation  Min-Moderate modification of tasks or assist with assess necessary to complete eval    OT Frequency 2x / week    OT Duration 12 weeks    OT Treatment/Interventions Self-care/ADL training;Visual/perceptual remediation/compensation;DME and/or AE instruction;Patient/family education;Moist Heat;Therapeutic exercise;Manual Therapy;Therapeutic activities;Neuromuscular education    Consulted and Agree with Plan of Care Patient    Family Member Consulted wife, James [name]           Patient will benefit from skilled therapeutic intervention in order to improve the following deficits and impairments:   Body Structure / Function / Physical Skills: ADL, Dexterity, Flexibility, ROM, Strength, Vision, Balance, Coordination, FMC, IADL, Tone, Endurance, UE functional use, Mobility Cognitive Skills: Attention, Safety Awareness, Problem Solve Psychosocial Skills: Environmental  Adaptations, Routines and Behaviors, Habits, Interpersonal Interaction   Visit Diagnosis: Muscle weakness (generalized)  Other lack of coordination  Neurologic neglect syndrome  Unsteadiness on feet    Problem List Patient Active Problem List   Diagnosis Date Noted  . Sleep disturbance   . PAF (paroxysmal atrial fibrillation) ([place])   . Recurrent UTI   . Hypokalemia   . Hyponatremia   . Abnormal urine   . 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 Benign  essential HTN   . Urinary retention   . History of gout   . Global aphasia   . SAH (subarachnoid hemorrhage) ([place])   . SDH (subdural hematoma) ([place])   . Lymphocytosis   . Subdural  hemorrhage following injury ([place]) 05/12/2016   James [name] James [name], OTR/L, CLT  James [name] 02/12/2020, 2:25 PM  [place] [facility] AND [facility] [address] [place], [address] Phone: [phone]   Fax:  [phone]  Name: James [name] MRN: [id number] Date of Birth: 09-18-1940

## 2020-02-17 ENCOUNTER — Other Ambulatory Visit: Payer: Self-pay

## 2020-02-17 ENCOUNTER — Ambulatory Visit: Payer: PPO | Admitting: Occupational Therapy

## 2020-02-17 ENCOUNTER — Encounter: Payer: Self-pay | Admitting: Occupational Therapy

## 2020-02-17 DIAGNOSIS — M6281 Muscle weakness (generalized): Secondary | ICD-10-CM

## 2020-02-17 DIAGNOSIS — R278 Other lack of coordination: Secondary | ICD-10-CM

## 2020-02-17 DIAGNOSIS — R414 Neurologic neglect syndrome: Secondary | ICD-10-CM

## 2020-02-17 DIAGNOSIS — R2681 Unsteadiness on feet: Secondary | ICD-10-CM

## 2020-02-18 NOTE — Therapy (Signed)
[place] MAIN [facility] SERVICES [address] [place], [place], [address] Phone: [phone]   Fax:  [phone]  Occupational Therapy Treatment  Patient Details  Name: James [name] MRN: [id number] Date of Birth: [birth year] Referring Provider (OT): Joselyn Arrow   Encounter Date: 02/17/2020   OT End of Session - 02/17/20 2036    Visit Number 31    Number of Visits 52    Date for OT Re-Evaluation 04/03/20    OT Start Time 1102    OT Stop Time 1146    OT Time Calculation (min) 44 min    Activity Tolerance Patient tolerated treatment well    Behavior During Therapy [place] for tasks assessed/performed           Past Medical History:  Diagnosis Date   Alcohol abuse, in remission    Atrial fibrillation ([place])    Bilateral renal masses    Closed right ankle fracture    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     Past Surgical History:  Procedure Laterality Date   CRANIECTOMY FOR DEPRESSED SKULL FRACTURE  2009   with MRSA infection   CRANIOTOMY     X 2   HERNIA REPAIR     during childhood   ORIF ANKLE FRACTURE Left 2012    There were no vitals filed for this visit.   Patient seen for manipulation of coins from table top to pick up and place into resistive bank with use of right hand.  Cues for tip to tip prehension patterns, patient still tends to move into a lateral pinch pattern rather than tip to tip pattern.  Patient demonstrates some difficulty with placing items into resistive bank, cues for isolated finger movement with index extension to push coin.    Patient seen for handwriting skills to fill and sign a check.  Fair to poor legibility and tending to drop pen multiple times with hand fatigue.   Patient did improve with modulating size of letter in smaller spaces.  Cues for tripod grasping pattern on pen.   Response to tx:   Patient continues to make progress with right hand function skills, especially with isolated finger movements and tip to tip prehension to pick up and manipulate objects.  Patient continues to demo difficulty but much improved over the last few sessions.  Handwriting with fair to poor legibility and difficulty with holding pen this date which may be attributed to hand fatigue.  Continue to work towards goals in plan of care to improve hand function for daily tasks.                            OT Long Term Goals - 02/12/20 1107      OT LONG TERM GOAL #1   Title Patient will demonstrate use of utensil in right hand for self feeding with modified independence with little to no spillage.     Baseline unable at eval to hold utensil, 10th visit holding utensil, some feeding. 20th visit, feeding self consistently but still has some moderate spillage    Time 12    Period Weeks    Status Achieved  OT LONG TERM GOAL #2   Title Pt will increase hand strength in right by 5# to be able to cut food with modified independence.    Baseline unable to cut meat at evaluation, 10th visit starting to work on cutting meat, patient reports difficulty with cutting meat    Time 12    Period Weeks    Status Achieved      OT LONG TERM GOAL #3   Title Patient will demonstrate increased awareness and strategies to attend to right side to hold items without dropping items.    Baseline difficulty with detecting items in right hand at eval, forgets items are in hand, 10th visit improved but still working on 12-30-2019: minimal dropping of items this date.  20th visit continued dropping of items occasionally but with increased awareness    Time 12    Period Weeks    Status On-going      OT LONG TERM GOAL #4   Title Patient will demonstrate independence in home  exercise program for ROM, strength and coordination of right hand.    Baseline eval-patient has not been performing exercises from last episode of care and demonstrates decline, 10th visit doing exercises at home and continue to modify program. 20th visit pt continues to engage in HEP with upgrades frequently    Time 12    Period Weeks    Status Partially Met      OT LONG TERM GOAL #6   Title Patient will demonstrate pouring a drink with the right hand with attention to right side and not knocking the cup over and spilling.      Baseline unable at eval, 10th visit starting to use right hand more and attempting to pour with right.  20th visit still has difficulty at times.    Time 12    Period Weeks    Status On-going                 Plan - 02/18/20 2036    Clinical Impression Statement Patient continues to make progress with right hand function skills, especially with isolated finger movements and tip to tip prehension to pick up and manipulate objects.  Patient continues to demo difficulty but much improved over the last few sessions.  Handwriting with fair to poor legibility and difficulty with holding pen this date which may be attributed to hand fatigue.  Continue to work towards goals in plan of care to improve hand function for daily tasks.    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    Occupational performance deficits (Please refer to evaluation for details): ADL's;IADL's;Leisure    Body Structure / Function / Physical Skills ADL;Dexterity;Flexibility;ROM;Strength;Vision;Balance;Coordination;FMC;IADL;Tone;Endurance;UE functional use;Mobility     Cognitive Skills Attention;Safety Awareness;Problem Solve    Psychosocial Skills Environmental  Adaptations;Routines and Behaviors;Habits;Interpersonal Interaction    Rehab Potential Good    Clinical Decision Making Several treatment options, min-mod task modification necessary    Comorbidities Affecting Occupational Performance: May have comorbidities impacting occupational performance    Modification or Assistance to Complete Evaluation  Min-Moderate modification of tasks or assist with assess necessary to complete eval    OT Frequency 2x / week    OT Duration 12 weeks    OT  Treatment/Interventions Self-care/ADL training;Visual/perceptual remediation/compensation;DME and/or AE instruction;Patient/family education;Moist Heat;Therapeutic exercise;Manual Therapy;Therapeutic activities;Neuromuscular education    Consulted and Agree with Plan of Care Patient    Family Member Consulted wife, [name]           Patient will benefit from skilled therapeutic intervention in order to improve the following deficits and impairments:   Body Structure / Function / Physical Skills: ADL, Dexterity, Flexibility, ROM, Strength, Vision, Balance, Coordination, FMC, IADL, Tone, Endurance, UE functional use, Mobility Cognitive Skills: Attention, Safety Awareness, Problem Solve Psychosocial Skills: Environmental  Adaptations, Routines and Behaviors, Habits, Interpersonal Interaction   Visit Diagnosis: Muscle weakness (generalized)  Other lack of coordination  Neurologic neglect syndrome  Unsteadiness on feet    Problem List Patient Active Problem List   Diagnosis Date Noted   Sleep disturbance    PAF (paroxysmal atrial fibrillation) (HCC)    Recurrent UTI    Hypokalemia    Hyponatremia    Abnormal urine    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

## 2020-02-19 ENCOUNTER — Ambulatory Visit: Payer: PPO | Admitting: Occupational Therapy

## 2020-02-19 ENCOUNTER — Encounter: Payer: Self-pay | Admitting: Occupational Therapy

## 2020-02-19 ENCOUNTER — Other Ambulatory Visit: Payer: Self-pay

## 2020-02-19 DIAGNOSIS — R2681 Unsteadiness on feet: Secondary | ICD-10-CM

## 2020-02-19 DIAGNOSIS — M6281 Muscle weakness (generalized): Secondary | ICD-10-CM

## 2020-02-19 DIAGNOSIS — R278 Other lack of coordination: Secondary | ICD-10-CM

## 2020-02-19 DIAGNOSIS — R414 Neurologic neglect syndrome: Secondary | ICD-10-CM

## 2020-02-19 NOTE — Therapy (Signed)
[place] MAIN [facility] SERVICES [address] [place], [place], [address] Phone: [phone]   Fax:  [phone]  Occupational Therapy Treatment  Patient Details  Name: James [name] MRN: [id number] Date of Birth: 04/07/1941 Referring Provider (OT): Joselyn Arrow   Encounter Date: 02/19/2020   OT End of Session - 02/19/20 1116    Visit Number 32    Number of Visits 40    Date for OT Re-Evaluation 04/03/20    OT Start Time 1100    OT Stop Time 1145    OT Time Calculation (min) 45 min    Activity Tolerance Patient tolerated treatment well    Behavior During Therapy [facility] for tasks assessed/performed           Past Medical History:  Diagnosis Date   Alcohol abuse, in remission    Atrial fibrillation ([place])    Bilateral renal masses    Closed right ankle fracture    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     Past Surgical History:  Procedure Laterality Date   CRANIECTOMY FOR DEPRESSED SKULL FRACTURE  2009   with MRSA infection   CRANIOTOMY     X 2   HERNIA REPAIR     during childhood   ORIF ANKLE FRACTURE Left 2012    There were no vitals filed for this visit.   Subjective Assessment - 02/19/20 1115    Subjective  Patient denies any pain, reports he is doing well    Pertinent History Patient suffered a fall in 2005 which resulted in a TBI with subdural hemorrhage followed by a CVA.  He suffered another fall in 2017.  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     Patient Stated Goals Patient indicates he wants to be stronger, use his right hand for daily tasks, be as independent as possible.    Multiple Pain Sites No           Patient seen for strengthening and ROM of RUE with use of  UBE for 5 mins from seated position, forwards/backwards and alternating levels of resistance from 3.0 to 3.4 with therapist in constant attendance to ensure grip and to adjust settings.    Manipulation of Cards for shuffling, dealing, sorting and flipping during use with game and sorting by suit from a scatterpile.  Cues for right side attention and awareness.  Cues to utilize right hand while performing task.   Manipulation of push pins to pick up with use of tip to tip prehension pattern and place into moderate resistance board to hand small square notes.  Patient engaging in handwriting on notes for a list of grocery items, patient required list to copy onto small notes.  Legibility fair with this task.    Response to tx: Patient demonstrates difficulty with shuffling cards with use of bilateral hands.  Decreased coordination on right and often dropping cards from the deck.  Was able to perform 2-3 times out of all attempts.  Handwriting with fair to poor legibility with multiple attempts for each word on a small sticky note to add to the bulletin board. Pt tends to write in script rather than print which is more difficult to read.  Difficulty with pen grasp this date, repeatedly dropping pen, switched to larger grip pen and had some improvements but tends to perform best with a gel pen.  Continue to work towards refining hand skills to improve functional use in daily tasks with right dominant hand.                     OT Education - 02/19/20 1115    Education provided Yes    Education Details FMC, bilateral hand tasks, right sided attention, scanning    Person(s) Educated Patient    Methods Explanation;Demonstration;Verbal cues    Comprehension Verbalized understanding;Returned demonstration;Verbal cues required               OT Long Term Goals - 02/12/20 1107      OT LONG TERM GOAL #1   Title Patient will demonstrate use of utensil in right hand for self feeding with  modified independence with little to no spillage.     Baseline unable at eval to hold utensil, 10th visit holding utensil, some feeding. 20th visit, feeding self consistently but still has some moderate spillage    Time 12    Period Weeks    Status Achieved      OT LONG TERM GOAL #2   Title Pt will increase hand strength in right by 5# to be able to cut food with modified independence.    Baseline unable to cut meat at evaluation, 10th visit starting to work on cutting meat, patient reports difficulty with cutting meat    Time 12    Period Weeks    Status Achieved      OT LONG TERM GOAL #3   Title Patient will demonstrate increased awareness and strategies to attend to right side to hold items without dropping items.    Baseline difficulty with detecting items in right hand at eval, forgets items are in hand, 10th visit improved but still working on 12-30-2019: minimal dropping of items this date.  20th visit continued dropping of items occasionally but with increased awareness    Time 12    Period Weeks    Status On-going      OT LONG TERM GOAL #4   Title Patient will demonstrate independence in home exercise program for ROM, strength and coordination of right hand.    Baseline eval-patient has not been performing exercises from last episode of care and demonstrates decline, 10th visit doing exercises at home and continue to modify program. 20th visit pt continues to engage in HEP with upgrades frequently    Time 12    Period Weeks    Status Partially Met      OT LONG TERM GOAL #6   Title Patient will demonstrate pouring a drink with the right hand with attention to right side and not knocking the cup over and spilling.      Baseline unable at eval, 10th visit starting to use right hand more and attempting to pour with right.  20th visit still has difficulty at times.    Time 12    Period Weeks    Status On-going                 Plan - 02/19/20 1116    Clinical Impression  Statement Patient demonstrates difficulty with shuffling cards with use of bilateral hands.  Decreased coordination on right and often dropping cards from the deck.  Was able to perform 2-3 times out of all attempts.  Handwriting with  fair to poor legibility with multiple attempts for each word on a small sticky note to add to the bulletin board. Pt tends to write in script rather than print which is more difficult to read. Difficulty with pen grasp this date, repeatedly dropping pen, switched to larger grip pen and had some improvements but tends to perform best with a gel pen.  Continue to work towards refining hand skills to improve functional use in daily tasks with right dominant hand.    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    Occupational performance deficits (Please refer to evaluation for details): ADL's;IADL's;Leisure    Body Structure / Function / Physical Skills ADL;Dexterity;Flexibility;ROM;Strength;Vision;Balance;Coordination;FMC;IADL;Tone;Endurance;UE functional use;Mobility    Cognitive Skills Attention;Safety Awareness;Problem Solve    Psychosocial Skills Environmental  Adaptations;Routines and Behaviors;Habits;Interpersonal Interaction    Rehab Potential Good    Clinical Decision Making Several treatment options, min-mod task modification necessary    Comorbidities Affecting Occupational Performance: May have comorbidities impacting occupational performance    Modification or Assistance to Complete Evaluation  Min-Moderate modification of tasks or assist with assess necessary to complete eval    OT Frequency 2x / week    OT Duration 12 weeks    OT  Treatment/Interventions Self-care/ADL training;Visual/perceptual remediation/compensation;DME and/or AE instruction;Patient/family education;Moist Heat;Therapeutic exercise;Manual Therapy;Therapeutic activities;Neuromuscular education    Consulted and Agree with Plan of Care Patient    Family Member Consulted wife, [name]           Patient will benefit from skilled therapeutic intervention in order to improve the following deficits and impairments:   Body Structure / Function / Physical Skills: ADL, Dexterity, Flexibility, ROM, Strength, Vision, Balance, Coordination, FMC, IADL, Tone, Endurance, UE functional use, Mobility Cognitive Skills: Attention, Safety Awareness, Problem Solve Psychosocial Skills: Environmental  Adaptations, Routines and Behaviors, Habits, Interpersonal Interaction   Visit Diagnosis: Muscle weakness (generalized)  Other lack of coordination  Neurologic neglect syndrome  Unsteadiness on feet    Problem List Patient Active Problem List   Diagnosis Date Noted   Sleep disturbance    PAF (paroxysmal atrial fibrillation) (HCC)    Recurrent UTI    Hypokalemia    Hyponatremia    Abnormal urine    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

## 2020-02-24 ENCOUNTER — Encounter: Payer: Self-pay | Admitting: Occupational Therapy

## 2020-02-24 ENCOUNTER — Encounter: Payer: PPO | Admitting: Occupational Therapy

## 2020-02-24 ENCOUNTER — Ambulatory Visit: Payer: PPO | Attending: Neurology | Admitting: Occupational Therapy

## 2020-02-24 ENCOUNTER — Other Ambulatory Visit: Payer: Self-pay

## 2020-02-24 DIAGNOSIS — R2681 Unsteadiness on feet: Secondary | ICD-10-CM | POA: Insufficient documentation

## 2020-02-24 DIAGNOSIS — R278 Other lack of coordination: Secondary | ICD-10-CM

## 2020-02-24 DIAGNOSIS — M6281 Muscle weakness (generalized): Secondary | ICD-10-CM

## 2020-02-24 DIAGNOSIS — R414 Neurologic neglect syndrome: Secondary | ICD-10-CM | POA: Diagnosis not present

## 2020-02-24 NOTE — Therapy (Signed)
[place] MAIN [facility] SERVICES [address] [place], [place], [address] Phone: [phone]   Fax:  [phone]  Occupational Therapy Treatment  Patient Details  Name: James [name] MRN: [id number] Date of Birth: [birth year] Referring Provider (OT): Joselyn Arrow   Encounter Date: 02/24/2020   OT End of Session - 02/24/20 1028    Visit Number 33    Number of Visits 48    Date for OT Re-Evaluation 04/03/20    OT Start Time 1017    OT Stop Time 1100    OT Time Calculation (min) 43 min    Activity Tolerance Patient tolerated treatment well    Behavior During Therapy [facility] for tasks assessed/performed           Past Medical History:  Diagnosis Date  . Alcohol abuse, in remission   . Atrial fibrillation ([place])   . Bilateral renal masses   . Closed right ankle fracture   . 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 Gait disorder   . Gout   . Hypertension   . Seizure disorder ([place])   . 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     Past Surgical History:  Procedure Laterality Date  . CRANIECTOMY FOR DEPRESSED SKULL FRACTURE  2009   with MRSA infection  . CRANIOTOMY     X 2  . HERNIA REPAIR     during childhood  . ORIF ANKLE FRACTURE Left 2012    There were no vitals filed for this visit.   Subjective Assessment - 02/24/20 1026    Subjective  Pt and wife report he helped in the kitchen with herbs, pulling off the pieces of thyme.    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  He suffered another fall in 2017.  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     Patient Stated Goals Patient indicates he wants to be stronger, use his right hand for daily tasks, be as independent as possible.    Currently in Pain? No/denies    Multiple Pain Sites No               Patient seen this date with use of right hand with use of juxaciser for focus on right hand motor skills, upper extremity ROM and joint strengthening, performing multiple repetitions this date with cues for directionality of moving item.    Strengthening with right hand with use of Digital gripper 34#, 32#, 30#, 24#, 20# and multiple trials of 5 reps.  Manipulation of a variety of sized Washers to pick up from magnetic bowl and place onto  Hooks in elevated plane of motion from seated position.  Cues for tip to tip prehension patterns, patient still tends to pick up more with a lateral pinch and requires repeated cues to change prehension patterns. Difficulty with attempts to use hand for storage and with translatory skills of the hand.  Multiple trials completed, dropping items frequently, dropped greater than 10 items this date.  Patient reports frustration at times with efforts.    Response to tx:   Patient with improved initiation of right hand use today compared to last session, decreased cues required to use right hand for task.  Patient continues to demonstrate difficulty at times with tip to tip prehension patterns  with manipulation skills especially when picking up items from the table or from magnetic bowl.  Improved pattern if he is handed the item or if he manages to slide washer to edge of table.  Continues to drop items frequently.  Some frustration this date with attempts but easily distracted with music from his teen years and redirected to alternate task.                     OT Education - 02/24/20 1028    Education provided Yes    Education Details [place], bilateral hand tasks, right sided attention, scanning    Person(s) Educated Patient    Methods Explanation;Demonstration;Verbal cues    Comprehension Verbalized understanding;Returned demonstration;Verbal cues required               OT Long Term Goals - 02/12/20 1107      OT LONG TERM GOAL  #1   Title Patient will demonstrate use of utensil in right hand for self feeding with modified independence with little to no spillage.     Baseline unable at eval to hold utensil, 10th visit holding utensil, some feeding. 20th visit, feeding self consistently but still has some moderate spillage    Time 12    Period Weeks    Status Achieved      OT LONG TERM GOAL #2   Title Pt will increase hand strength in right by 5# to be able to cut food with modified independence.    Baseline unable to cut meat at evaluation, 10th visit starting to work on cutting meat, patient reports difficulty with cutting meat    Time 12    Period Weeks    Status Achieved      OT LONG TERM GOAL #3   Title Patient will demonstrate increased awareness and strategies to attend to right side to hold items without dropping items.    Baseline difficulty with detecting items in right hand at eval, forgets items are in hand, 10th visit improved but still working on 12-30-2019: minimal dropping of items this date.  20th visit continued dropping of items occasionally but with increased awareness    Time 12    Period Weeks    Status On-going      OT LONG TERM GOAL #4   Title Patient will demonstrate independence in home exercise program for ROM, strength and coordination of right hand.    Baseline eval-patient has not been performing exercises from last episode of care and demonstrates decline, 10th visit doing exercises at home and continue to modify program. 20th visit pt continues to engage in HEP with upgrades frequently    Time 12    Period Weeks    Status Partially Met      OT LONG TERM GOAL #6   Title Patient will demonstrate pouring a drink with the right hand with attention to right side and not knocking the cup over and spilling.      Baseline unable at eval, 10th visit starting to use right hand more and attempting to pour with right.  20th visit still has difficulty at times.    Time 12    Period Weeks     Status On-going                 Plan - 02/24/20 1029    Clinical Impression Statement Patient with improved initiation of right hand use today compared to last session, decreased cues required to use right hand for  task.  Patient continues to demonstrate difficulty at times with tip to tip prehension patterns with manipulation skills especially when picking up items from the table or from magnetic bowl.  Improved pattern if he is handed the item or if he manages to slide washer to edge of table.  Continues to drop items frequently.  Some frustration this date with attempts but easily distracted with music from his teen years and redirected to alternate task.    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    Occupational performance deficits (Please refer to evaluation for details): ADL's;IADL's;Leisure    Body Structure / Function / Physical Skills ADL;Dexterity;Flexibility;ROM;Strength;Vision;Balance;Coordination;FMC;IADL;Tone;Endurance;UE functional use;Mobility    Cognitive Skills Attention;Safety Awareness;Problem Solve    Psychosocial Skills Environmental  Adaptations;Routines and Behaviors;Habits;Interpersonal Interaction    Rehab Potential Good    Clinical Decision Making Several treatment options, min-mod task modification necessary    Comorbidities Affecting Occupational Performance: May have comorbidities impacting occupational performance    Modification or Assistance to Complete Evaluation  Min-Moderate modification of tasks or assist with assess necessary to complete eval    OT Frequency 2x / week    OT Duration 12 weeks    OT  Treatment/Interventions Self-care/ADL training;Visual/perceptual remediation/compensation;DME and/or AE instruction;Patient/family education;Moist Heat;Therapeutic exercise;Manual Therapy;Therapeutic activities;Neuromuscular education    Consulted and Agree with Plan of Care Patient    Family Member Consulted wife, [name]           Patient will benefit from skilled therapeutic intervention in order to improve the following deficits and impairments:   Body Structure / Function / Physical Skills: ADL, Dexterity, Flexibility, ROM, Strength, Vision, Balance, Coordination, FMC, IADL, Tone, Endurance, UE functional use, Mobility Cognitive Skills: Attention, Safety Awareness, Problem Solve Psychosocial Skills: Environmental  Adaptations, Routines and Behaviors, Habits, Interpersonal Interaction   Visit Diagnosis: Muscle weakness (generalized)  Other lack of coordination  Neurologic neglect syndrome  Unsteadiness on feet    Problem List Patient Active Problem List   Diagnosis Date Noted  . Sleep disturbance   . PAF (paroxysmal atrial fibrillation) ([place])   . Recurrent UTI   . Hypokalemia   . Hyponatremia   . Abnormal urine   . 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 Benign essential HTN   . Urinary retention   . History of gout   . Global aphasia   . SAH (subarachnoid hemorrhage) ([place])   . SDH (subdural hematoma) ([place])   . Lymphocytosis   . Subdural hemorrhage following injury ([place]) 05/12/2016   Rodriguez Aguinaldo T Deundra Bard, OTR/L, CLT  Arianis Bowditch 02/24/2020, 9:15 PM  [place] MAIN [facility] SERVICES [address] [place], [place], [address] Phone: [phone]   Fax:  [phone]  Name: James [name] MRN: [id number] Date of Birth: 05-24-1941

## 2020-02-25 ENCOUNTER — Encounter: Payer: PPO | Admitting: Occupational Therapy

## 2020-02-26 ENCOUNTER — Other Ambulatory Visit: Payer: Self-pay

## 2020-02-26 ENCOUNTER — Ambulatory Visit: Payer: PPO | Admitting: Occupational Therapy

## 2020-02-26 ENCOUNTER — Encounter: Payer: Self-pay | Admitting: Occupational Therapy

## 2020-02-26 DIAGNOSIS — R2681 Unsteadiness on feet: Secondary | ICD-10-CM

## 2020-02-26 DIAGNOSIS — R414 Neurologic neglect syndrome: Secondary | ICD-10-CM

## 2020-02-26 DIAGNOSIS — M6281 Muscle weakness (generalized): Secondary | ICD-10-CM | POA: Diagnosis not present

## 2020-02-26 DIAGNOSIS — R278 Other lack of coordination: Secondary | ICD-10-CM
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
## 2020-03-02 ENCOUNTER — Encounter: Payer: PPO | Admitting: Occupational Therapy

## 2020-03-04 ENCOUNTER — Encounter: Payer: Self-pay | Admitting: Occupational Therapy

## 2020-03-04 ENCOUNTER — Other Ambulatory Visit: Payer: Self-pay

## 2020-03-04 ENCOUNTER — Ambulatory Visit: Payer: PPO | Admitting: Occupational Therapy

## 2020-03-04 DIAGNOSIS — R414 Neurologic neglect syndrome: Secondary | ICD-10-CM

## 2020-03-04 DIAGNOSIS — M6281 Muscle weakness (generalized): Secondary | ICD-10-CM

## 2020-03-04 DIAGNOSIS — R278 Other lack of coordination: Secondary | ICD-10-CM

## 2020-03-04 NOTE — Therapy (Signed)
[place] [facility] MAIN [facility] SERVICES [address] [place], [place], [address] Phone: [phone]   Fax:  [phone]  Occupational Therapy Treatment  Patient Details  Name: James [name] MRN: [id number] Date of Birth: 12/13/1940 Referring Provider (OT): Steele Sizer   Encounter Date: 03/04/2020   OT End of Session - 03/04/20 1913    Visit Number 35    Number of Visits 48    Date for OT Re-Evaluation 04/03/20    OT Start Time 1100    OT Stop Time 1145    OT Time Calculation (min) 45 min    Activity Tolerance Patient tolerated treatment well    Behavior During Therapy [facility] for tasks assessed/performed           Past Medical History:  Diagnosis Date  . Alcohol abuse, in remission   . Atrial fibrillation (HCC)   . Bilateral renal masses   . Closed right ankle fracture   . 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 Gait disorder   . Gout   . Hypertension   . Seizure disorder (HCC)   . 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     Past Surgical History:  Procedure Laterality Date  . CRANIECTOMY FOR DEPRESSED SKULL FRACTURE  2009   with MRSA infection  . CRANIOTOMY     X 2  . HERNIA REPAIR     during childhood  . ORIF ANKLE FRACTURE Left 2012    There were no vitals filed for this visit.   Subjective Assessment - 03/04/20 1911    Subjective  Patient smiling on arrival and brought in his own wooden tongs and chinese balls and wants to work with them and get exercises to use them at home.    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  He suffered another fall in 2017.  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     Patient Stated Goals Patient indicates he wants to be stronger, use his right hand for daily tasks, be as independent as possible.     Currently in Pain? No/denies    Multiple Pain Sites No          Neuromuscular Reeducation:  Patient seen this date with focus on manipulation of chinese balls in right hand with therapist assisting to move balls from one position to another, rotating from one side of the hand to another.  When patient performs with left hand for demonstration and then switches to right, ability to perform in right hand improves.   One ball in right hand to attempt pushing ball to tips of fingers and back to palm with moderate difficulty.  Multiple trials performed with assist and cues.   Manipulation of small pegs-pt unable to pick up item from table but can retrieve item from therapist when handed item, cues for tip to tip pattern.  Patient placing into board with difficulty to turn item with fingers.  Removing small pegs with use of tongs with cues for tripod grasp on tongs with right hand.        Response to tx: Patient excited about purchasing set of wooden tongs and a set of chinese balls and interested in implementing into his home program.  Patient requires assist with manipulation of balls in right hand.  Patient requires cues for use of thumb to assist with moving balls from one side of the hand to another but benefits from performing with left hand first to get the proprioceptive feel for the task.  Patient able to utilize tongs to remove items from board but requires occasional cues to readjust grasping patterns on tongs.  Continue to work towards improving right hand function and modifications to home program.                 OT Education - 03/04/20 1912    Education provided Yes    Education Details prehension, tip to tip patterns, use of tongs, chinese balls    Person(s) Educated Patient    Methods Explanation;Demonstration;Verbal cues    Comprehension Verbalized understanding;Returned demonstration;Verbal cues required               OT Long Term Goals - 02/12/20 1107       OT LONG TERM GOAL #1   Title Patient will demonstrate use of utensil in right hand for self feeding with modified independence with little to no spillage.     Baseline unable at eval to hold utensil, 10th visit holding utensil, some feeding. 20th visit, feeding self consistently but still has some moderate spillage    Time 12    Period Weeks    Status Achieved      OT LONG TERM GOAL #2   Title Pt will increase hand strength in right by 5# to be able to cut food with modified independence.    Baseline unable to cut meat at evaluation, 10th visit starting to work on cutting meat, patient reports difficulty with cutting meat    Time 12    Period Weeks    Status Achieved      OT LONG TERM GOAL #3   Title Patient will demonstrate increased awareness and strategies to attend to right side to hold items without dropping items.    Baseline difficulty with detecting items in right hand at eval, forgets items are in hand, 10th visit improved but still working on 12-30-2019: minimal dropping of items this date.  20th visit continued dropping of items occasionally but with increased awareness    Time 12    Period Weeks    Status On-going      OT LONG TERM GOAL #4   Title Patient will demonstrate independence in home exercise program for ROM, strength and coordination of right hand.    Baseline eval-patient has not been performing exercises from last episode of care and demonstrates decline, 10th visit doing exercises at home and continue to modify program. 20th visit pt continues to engage in HEP with upgrades frequently    Time 12    Period Weeks    Status Partially Met      OT LONG TERM GOAL #6   Title Patient will demonstrate pouring a drink with the right hand with attention to right side and not knocking the cup over and spilling.      Baseline unable at eval, 10th visit starting to use right hand more and attempting to pour with right.  20th visit still has difficulty at times.    Time 12     Period Weeks    Status On-going                 Plan - 03/04/20 1913    Clinical Impression Statement Patient excited about purchasing set of wooden tongs and a set of chinese balls  and interested in implementing into his home program.  Patient requires assist with manipulation of balls in right hand.  Patient requires cues for use of thumb to assist with moving balls from one side of the hand to another but benefits from performing with left hand first to get the proprioceptive feel for the task.  Patient able to utilize tongs to remove items from board but requires occasional cues to readjust grasping patterns on tongs.  Continue to work towards improving right hand function and modifications to home program.    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    Occupational performance deficits (Please refer to evaluation for details): ADL's;IADL's;Leisure    Body Structure / Function / Physical Skills ADL;Dexterity;Flexibility;ROM;Strength;Vision;Balance;Coordination;FMC;IADL;Tone;Endurance;UE functional use;Mobility    Cognitive Skills Attention;Safety Awareness;Problem Solve    Psychosocial Skills Environmental  Adaptations;Routines and Behaviors;Habits;Interpersonal Interaction    Rehab Potential Good    Clinical Decision Making Several treatment options, min-mod task modification necessary    Comorbidities Affecting Occupational Performance: May have comorbidities impacting occupational performance    Modification or Assistance to Complete Evaluation  Min-Moderate modification of tasks or assist with assess necessary to complete eval    OT Frequency 2x / week     OT Duration 12 weeks    OT Treatment/Interventions Self-care/ADL training;Visual/perceptual remediation/compensation;DME and/or AE instruction;Patient/family education;Moist Heat;Therapeutic exercise;Manual Therapy;Therapeutic activities;Neuromuscular education    Consulted and Agree with Plan of Care Patient    Family Member Consulted wife, [name]           Patient will benefit from skilled therapeutic intervention in order to improve the following deficits and impairments:   Body Structure / Function / Physical Skills: ADL, Dexterity, Flexibility, ROM, Strength, Vision, Balance, Coordination, FMC, IADL, Tone, Endurance, UE functional use, Mobility Cognitive Skills: Attention, Safety Awareness, Problem Solve Psychosocial Skills: Environmental  Adaptations, Routines and Behaviors, Habits, Interpersonal Interaction   Visit Diagnosis: Muscle weakness (generalized)  Other lack of coordination  Neurologic neglect syndrome    Problem List Patient Active Problem List   Diagnosis Date Noted  . Sleep disturbance   . PAF (paroxysmal atrial fibrillation) ([place])   . Recurrent UTI   . Hypokalemia   . Hyponatremia   . Abnormal urine   . 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 Benign essential HTN   . Urinary retention   . History of gout   . Global aphasia   . SAH (subarachnoid hemorrhage) ([place])   . SDH (subdural hematoma) ([place])   . Lymphocytosis   . Subdural hemorrhage following injury [facility]) 05/12/2016   Kea Callan T Saraiah Bhat, OTR/L, CLT  Codee Tutson 03/04/2020, 7:44 PM  [place] MAIN [facility] SERVICES [address] [place], [place], [address] Phone: [phone]   Fax:  [phone]  Name: Tayshon Winker MRN: [id number] Date of Birth: 1941/02/06

## 2020-03-09 ENCOUNTER — Other Ambulatory Visit: Payer: Self-pay

## 2020-03-09 ENCOUNTER — Ambulatory Visit: Payer: PPO | Admitting: Occupational Therapy

## 2020-03-09 DIAGNOSIS — R2681 Unsteadiness on feet: Secondary | ICD-10-CM

## 2020-03-09 DIAGNOSIS — R414 Neurologic neglect syndrome: Secondary | ICD-10-CM

## 2020-03-09 DIAGNOSIS — M6281 Muscle weakness (generalized): Secondary | ICD-10-CM

## 2020-03-09 DIAGNOSIS — R278 Other lack of coordination: Secondary | ICD-10-CM

## 2020-03-11 ENCOUNTER — Ambulatory Visit: Payer: PPO | Admitting: Occupational Therapy

## 2020-03-12 ENCOUNTER — Encounter: Payer: Self-pay | Admitting: Occupational Therapy

## 2020-03-12 NOTE — Therapy (Signed)
[place] MAIN [facility] SERVICES [address] [place], [place], [address] Phone: [phone]   Fax:  [phone]  Occupational Therapy Treatment  Patient Details  Name: James [name] MRN: [id number] Date of Birth: [birth year] Referring Provider (OT): Joselyn Arrow   Encounter Date: 03/09/2020   OT End of Session - 03/12/20 2213    Visit Number 36    Number of Visits 48    Date for OT Re-Evaluation 04/03/20    OT Start Time 1052    OT Stop Time 1146    OT Time Calculation (min) 54 min    Activity Tolerance Patient tolerated treatment well    Behavior During Therapy [facility] for tasks assessed/performed           Past Medical History:  Diagnosis Date  . Alcohol abuse, in remission   . Atrial fibrillation ([place])   . Bilateral renal masses   . Closed right ankle fracture   . 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 Gait disorder   . Gout   . Hypertension   . Seizure disorder ([place])   . TBI (traumatic brain injury) ([place]) 2005   with residual  right sided weakness, aphasia and loss of peripheral vision. 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     Past Surgical History:  Procedure Laterality Date  . CRANIECTOMY FOR DEPRESSED SKULL FRACTURE  2009   with MRSA infection  . CRANIOTOMY     X 2  . HERNIA REPAIR     during childhood  . ORIF ANKLE FRACTURE Left 2012    There were no vitals filed for this visit.   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  He suffered another fall in 2017.  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     Patient Stated Goals Patient indicates he wants to be stronger, use his right hand for daily tasks, be as independent as possible.    Currently in Pain? No/denies    Multiple Pain Sites No           Patient indicating some frustration today with use of hand to pick up and manage items during  session.     Patient seen for strengthening tasks with use of UBE from seated position for 5 mins, resistance of 3 to 3.5, motions forwards and backwards and alternating levels of resistance with therapist in constant attendance to ensure grip and to adjust settings.    Resistive pinch with right hand with all levels of resistance pinch pins from yellow to black levels.  Pattern design with use of Judy board, to place colored pegs in the board to formulate and copy the design from the large card.  Pt required cues for right hand initiation today for multiple times, tending to get focused on pattern and when distracted he resorts to using his left non dominant/unaffected hand.  Difficulty picking up from the table if peg is on its side but if pegs are sitting upright, he is able to manage with tip to tip prehension pattern and cues.     Response to tx: Patient a bit frustrated with task today with pattern design, especially if he is focused on pattern he tends to attempt to perform task with his left hand and requires cues to use right.  He has difficulty  With picking up the pegs if  they are laying on the table on their side, decreased manipulation skills for turning item in his hand.  Patient requires encouragement with difficult tasks but responds well to cues and modeling of manipulation patterns.  Continue to work towards goals to impact functional hand skills and use for necessary daily tasks.                    OT Education - 03/12/20 2212    Education provided Yes    Education Details prehension, tip to tip patterns    Person(s) Educated Patient    Methods Explanation;Demonstration;Verbal cues    Comprehension Verbalized understanding;Returned demonstration;Verbal cues required               OT Long Term Goals - 02/12/20 1107      OT LONG TERM GOAL #1   Title Patient will demonstrate use of utensil in right hand for self feeding with modified independence with  little to no spillage.     Baseline unable at eval to hold utensil, 10th visit holding utensil, some feeding. 20th visit, feeding self consistently but still has some moderate spillage    Time 12    Period Weeks    Status Achieved      OT LONG TERM GOAL #2   Title Pt will increase hand strength in right by 5# to be able to cut food with modified independence.    Baseline unable to cut meat at evaluation, 10th visit starting to work on cutting meat, patient reports difficulty with cutting meat    Time 12    Period Weeks    Status Achieved      OT LONG TERM GOAL #3   Title Patient will demonstrate increased awareness and strategies to attend to right side to hold items without dropping items.    Baseline difficulty with detecting items in right hand at eval, forgets items are in hand, 10th visit improved but still working on 12-30-2019: minimal dropping of items this date.  20th visit continued dropping of items occasionally but with increased awareness    Time 12    Period Weeks    Status On-going      OT LONG TERM GOAL #4   Title Patient will demonstrate independence in home exercise program for ROM, strength and coordination of right hand.    Baseline eval-patient has not been performing exercises from last episode of care and demonstrates decline, 10th visit doing exercises at home and continue to modify program. 20th visit pt continues to engage in HEP with upgrades frequently    Time 12    Period Weeks    Status Partially Met      OT LONG TERM GOAL #6   Title Patient will demonstrate pouring a drink with the right hand with attention to right side and not knocking the cup over and spilling.      Baseline unable at eval, 10th visit starting to use right hand more and attempting to pour with right.  20th visit still has difficulty at times.    Time 12    Period Weeks    Status On-going                 Plan - 03/12/20 2214    OT Occupational Profile and History Detailed  Assessment- Review of Records and additional review of physical, cognitive, psychosocial history related to current functional performance    Occupational Profile and client history currently impacting functional performance repeated falls, TBI,  expressive aphasia, limited motion in right hand, lack of coordination from previous CVA    Occupational performance deficits (Please refer to evaluation for details): ADL's;IADL's;Leisure    Body Structure / Function / Physical Skills ADL;Dexterity;Flexibility;ROM;Strength;Vision;Balance;Coordination;FMC;IADL;Tone;Endurance;UE functional use;Mobility    Cognitive Skills Attention;Safety Awareness;Problem Solve    Psychosocial Skills Environmental  Adaptations;Routines and Behaviors;Habits;Interpersonal Interaction    Rehab Potential Good    Clinical Decision Making Several treatment options, min-mod task modification necessary    Comorbidities Affecting Occupational Performance: May have comorbidities impacting occupational performance    Modification or Assistance to Complete Evaluation  Min-Moderate modification of tasks or assist with assess necessary to complete eval    OT Frequency 2x / week    OT Duration 12 weeks    OT Treatment/Interventions Self-care/ADL training;Visual/perceptual remediation/compensation;DME and/or AE instruction;Patient/family education;Moist Heat;Therapeutic exercise;Manual Therapy;Therapeutic activities;Neuromuscular education    Consulted and Agree with Plan of Care Patient    Family Member Consulted wife, [name]           Patient will benefit from skilled therapeutic intervention in order to improve the following deficits and impairments:   Body Structure / Function / Physical Skills: ADL, Dexterity, Flexibility, ROM, Strength, Vision, Balance, Coordination, FMC, IADL, Tone, Endurance, UE functional use, Mobility Cognitive Skills: Attention, Safety Awareness, Problem Solve Psychosocial Skills: Environmental  Adaptations,  Routines and Behaviors, Habits, Interpersonal Interaction   Visit Diagnosis: Muscle weakness (generalized)  Other lack of coordination  Neurologic neglect syndrome  Unsteadiness on feet    Problem List Patient Active Problem List   Diagnosis Date Noted  . Sleep disturbance   . PAF (paroxysmal atrial fibrillation) ([place])   . Recurrent UTI   . Hypokalemia   . Hyponatremia   . Abnormal urine   . 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 Benign essential HTN   . Urinary retention   . History of gout   . Global aphasia   . SAH (subarachnoid hemorrhage) ([place])   . SDH (subdural hematoma) ([place])   . Lymphocytosis   . Subdural hemorrhage following injury ([place]) 05/12/2016   James [name] T Pastor Sgro, OTR/L, CLT  James [name] 03/12/2020, 10:14 PM  [place] MAIN [facility] SERVICES [address] [place], [place], [address] Phone: [phone]   Fax:  [phone]  Name: James [name] MRN: [id number] Date of Birth: January 27, 1941

## 2020-03-16 ENCOUNTER — Ambulatory Visit: Payer: PPO | Admitting: Occupational Therapy

## 2020-03-16 ENCOUNTER — Other Ambulatory Visit: Payer: Self-pay

## 2020-03-16 DIAGNOSIS — R278 Other lack of coordination: Secondary | ICD-10-CM

## 2020-03-16 DIAGNOSIS — M6281 Muscle weakness (generalized): Secondary | ICD-10-CM

## 2020-03-16 DIAGNOSIS — R414 Neurologic neglect syndrome: Secondary | ICD-10-CM

## 2020-03-16 DIAGNOSIS — R2681 Unsteadiness on feet: Secondary | ICD-10-CM

## 2020-03-18 ENCOUNTER — Encounter: Payer: Self-pay | Admitting: Occupational Therapy

## 2020-03-18 ENCOUNTER — Ambulatory Visit: Payer: PPO | Admitting: Occupational Therapy

## 2020-03-18 ENCOUNTER — Other Ambulatory Visit: Payer: Self-pay

## 2020-03-18 DIAGNOSIS — M6281 Muscle weakness (generalized): Secondary | ICD-10-CM | POA: Diagnosis not present

## 2020-03-18 DIAGNOSIS — R2681 Unsteadiness on feet: Secondary | ICD-10-CM

## 2020-03-18 DIAGNOSIS — R278 Other lack of coordination: Secondary | ICD-10-CM

## 2020-03-18 DIAGNOSIS — R414 Neurologic neglect syndrome: Secondary | ICD-10-CM

## 2020-03-18 NOTE — Therapy (Signed)
[place] [facility] MAIN [facility] SERVICES [address] [place], [place], [address] Phone: [phone]   Fax:  [phone]  Occupational Therapy Treatment  Patient Details  Name: James [name] MRN: [id number] Date of Birth: 02-Oct-1940 Referring Provider (OT): Steele Sizer   Encounter Date: 03/16/2020   OT End of Session - 03/18/20 1844    Visit Number 37    Number of Visits 48    Date for OT Re-Evaluation 04/03/20    OT Start Time 1055    OT Stop Time 1145    OT Time Calculation (min) 50 min    Activity Tolerance Patient tolerated treatment well    Behavior During Therapy [facility] for tasks assessed/performed           Past Medical History:  Diagnosis Date  . Alcohol abuse, in remission   . Atrial fibrillation (HCC)   . Bilateral renal masses   . Closed right ankle fracture   . 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 Gait disorder   . Gout   . Hypertension   . Seizure disorder (HCC)   . 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     Past Surgical History:  Procedure Laterality Date  . CRANIECTOMY FOR DEPRESSED SKULL FRACTURE  2009   with MRSA infection  . CRANIOTOMY     X 2  . HERNIA REPAIR     during childhood  . ORIF ANKLE FRACTURE Left 2012    There were no vitals filed for this visit.   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  He suffered another fall in 2017.  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     Patient Stated Goals Patient indicates he wants to be stronger, use his right hand for daily tasks, be as independent as possible.    Multiple Pain Sites No           Patient seen for use of  scissors for right hand, regular scissors as well as looped scissor options.  Patient engaging in folding paper 1/2 and cutting along the lines.  Difficulty with use of scissors in right hand and keeping fingers in the loops, dropping occasionally.  Performs best when using visual feedback with looking at the scissors while using.  Able to demonstrate looped scissors but has difficulty with maintaining a secure gripping pattern.  Patient cutting 1/2 sheets of paper to formulate a handwriting booklet for his homework.  Patient requires assist for drawing lines on paper using a ruler, cues for hand placement and line depth.  Assist with use of stapler to place booklet together.  Patient seen for manipulation of a variety size of washers to pick up from magnetic bowl and place onto hooks in elevated plane of motion.  Patient demonstrates some difficulty with bringing washers to edge of the bowl to use a tip to tip pattern for picking up.  Cues provided, some items placed on tabletop and using a method bringing washers to edge of table.  Active reaching patterns to place washers onto hooks, occasional difficulty with angling items to place onto hook.    Response to  tx:   Patient demonstrates increased focus on tasks which have an outcome such as task of making a homework booklet today.  Difficulty with maintaining proper grasp on scissors whether it be regular or looped options.  Continues to drop items frequently from right hand.  Patient continues to work towards tip to tip prehension patterns for manipulation of smaller objects.  Patient to use booklet at home for handwriting and will bring in for therapist to view next session.                       OT Education - 03/18/20 1843    Education provided Yes    Education Details scissor use, manipulation skills    Person(s) Educated Patient    Methods Explanation;Demonstration;Verbal cues    Comprehension Verbalized understanding;Returned  demonstration;Verbal cues required               OT Long Term Goals - 02/12/20 1107      OT LONG TERM GOAL #1   Title Patient will demonstrate use of utensil in right hand for self feeding with modified independence with little to no spillage.     Baseline unable at eval to hold utensil, 10th visit holding utensil, some feeding. 20th visit, feeding self consistently but still has some moderate spillage    Time 12    Period Weeks    Status Achieved      OT LONG TERM GOAL #2   Title Pt will increase hand strength in right by 5# to be able to cut food with modified independence.    Baseline unable to cut meat at evaluation, 10th visit starting to work on cutting meat, patient reports difficulty with cutting meat    Time 12    Period Weeks    Status Achieved      OT LONG TERM GOAL #3   Title Patient will demonstrate increased awareness and strategies to attend to right side to hold items without dropping items.    Baseline difficulty with detecting items in right hand at eval, forgets items are in hand, 10th visit improved but still working on 12-30-2019: minimal dropping of items this date.  20th visit continued dropping of items occasionally but with increased awareness    Time 12    Period Weeks    Status On-going      OT LONG TERM GOAL #4   Title Patient will demonstrate independence in home exercise program for ROM, strength and coordination of right hand.    Baseline eval-patient has not been performing exercises from last episode of care and demonstrates decline, 10th visit doing exercises at home and continue to modify program. 20th visit pt continues to engage in HEP with upgrades frequently    Time 12    Period Weeks    Status Partially Met      OT LONG TERM GOAL #6   Title Patient will demonstrate pouring a drink with the right hand with attention to right side and not knocking the cup over and spilling.      Baseline unable at eval, 10th visit starting to use right hand  more and attempting to pour with right.  20th visit still has difficulty at times.    Time 12    Period Weeks    Status On-going                 Plan - 03/18/20 1844    Clinical Impression Statement Patient demonstrates increased focus  on tasks which have an outcome such as task of making a homework booklet today.  Difficulty with maintaining proper grasp on scissors whether it be regular or looped options.  Continues to drop items frequently from right hand.  Patient continues to work towards tip to tip prehension patterns for manipulation of smaller objects.  Patient to use booklet at home for handwriting and will bring in for therapist to view next session.    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    Occupational performance deficits (Please refer to evaluation for details): ADL's;IADL's;Leisure    Body Structure / Function / Physical Skills ADL;Dexterity;Flexibility;ROM;Strength;Vision;Balance;Coordination;FMC;IADL;Tone;Endurance;UE functional use;Mobility    Cognitive Skills Attention;Safety Awareness;Problem Solve    Psychosocial Skills Environmental  Adaptations;Routines and Behaviors;Habits;Interpersonal Interaction    Rehab Potential Good    Clinical Decision Making Several treatment options, min-mod task modification necessary    Comorbidities Affecting Occupational Performance: May have comorbidities impacting occupational performance    Modification or Assistance to Complete Evaluation  Min-Moderate modification of tasks or assist with assess necessary to complete eval    OT Frequency 2x / week    OT Duration 12 weeks    OT  Treatment/Interventions Self-care/ADL training;Visual/perceptual remediation/compensation;DME and/or AE instruction;Patient/family education;Moist Heat;Therapeutic exercise;Manual Therapy;Therapeutic activities;Neuromuscular education    Consulted and Agree with Plan of Care Patient    Family Member Consulted wife, [name]           Patient will benefit from skilled therapeutic intervention in order to improve the following deficits and impairments:   Body Structure / Function / Physical Skills: ADL, Dexterity, Flexibility, ROM, Strength, Vision, Balance, Coordination, FMC, IADL, Tone, Endurance, UE functional use, Mobility Cognitive Skills: Attention, Safety Awareness, Problem Solve Psychosocial Skills: Environmental  Adaptations, Routines and Behaviors, Habits, Interpersonal Interaction   Visit Diagnosis: Muscle weakness (generalized)  Other lack of coordination  Neurologic neglect syndrome  Unsteadiness on feet    Problem List Patient Active Problem List   Diagnosis Date Noted  . Sleep disturbance   . PAF (paroxysmal atrial fibrillation) ([place])   . Recurrent UTI   . Hypokalemia   . Hyponatremia   . Abnormal urine   . 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 Benign essential HTN   . Urinary retention   . History of gout   . Global aphasia   . SAH (subarachnoid hemorrhage) ([place])   . SDH (subdural hematoma) ([place])   . Lymphocytosis   . Subdural hemorrhage following injury [facility]) 05/12/2016   Lakedra Washington T Demarius Archila, OTR/L, CLT  Jonny Dearden 03/18/2020, 7:04 PM  [place] MAIN [facility] SERVICES [address] [place], [place], [address] Phone: [phone]   Fax:  [phone]  Name: James [name] MRN: [id number] Date of Birth: 26-Jun-1941

## 2020-03-20 ENCOUNTER — Encounter: Payer: Self-pay | Admitting: Occupational Therapy

## 2020-03-20 NOTE — Therapy (Signed)
[place] MAIN [facility] SERVICES [address] [place], [place], [address] Phone: [phone]   Fax:  [phone]  Occupational Therapy Treatment  Patient Details  Name: James [name] MRN: [id number] Date of Birth: Dec 23, 1940 Referring Provider (OT): Joselyn Arrow   Encounter Date: 03/18/2020   OT End of Session - 03/20/20 1840    Visit Number 38    Number of Visits 48    Date for OT Re-Evaluation 04/03/20    OT Start Time 1100    OT Stop Time 1147    OT Time Calculation (min) 47 min    Activity Tolerance Patient tolerated treatment well    Behavior During Therapy [facility] for tasks assessed/performed           Past Medical History:  Diagnosis Date  . Alcohol abuse, in remission   . Atrial fibrillation ([place])   . Bilateral renal masses   . Closed right ankle fracture   . 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 Gait disorder   . Gout   . Hypertension   . Seizure disorder ([place])   . TBI (traumatic brain injury) ([place]) 2005   with residual  right sided weakness, aphasia and loss of peripheral vision. 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     Past Surgical History:  Procedure Laterality Date  . CRANIECTOMY FOR DEPRESSED SKULL FRACTURE  2009   with MRSA infection  . CRANIOTOMY     X 2  . HERNIA REPAIR     during childhood  . ORIF ANKLE FRACTURE Left 2012    There were no vitals filed for this visit.   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  He suffered another fall in 2017.  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     Patient Stated Goals Patient indicates he wants to be stronger, use his right hand for daily tasks, be as independent as possible.    Currently in Pain? No/denies     Multiple Pain Sites No           Patient seen this date for manipulation skills, prehension patterns and isolated finger movements with use of scrabble tiles, turning and flipping to be able to see the letters.   Assist to formulate list of lunch items, therapist writing list and patient required to copy words from list using scrabble tiles with use of visual scanning and attention to left side.  Minimal cues to locate 2 letters, otherwise patient was able.  He did put a few letters in incorrect order and required assist to correct.  Increased time allowed to complete activity    Response to tx: Patient performs well with task which has an outcome, cues for isolated finger movements to move tiles, flip and turn.  Patient demonstrates some difficulty with finding a couple of tiles on his left side, as well as some difficulty with sequencing of letters at times when copying words.  Continue to work towards goals in plan of care to improve right hand function, increased awareness and increased independence in daily tasks.                       OT Education -  03/20/20 1839    Education provided Yes    Education Details isolated finger movement, tip to tip prehension, manipulation skills    Person(s) Educated Patient    Methods Explanation;Demonstration;Verbal cues    Comprehension Verbalized understanding;Returned demonstration;Verbal cues required               OT Long Term Goals - 02/12/20 1107      OT LONG TERM GOAL #1   Title Patient will demonstrate use of utensil in right hand for self feeding with modified independence with little to no spillage.     Baseline unable at eval to hold utensil, 10th visit holding utensil, some feeding. 20th visit, feeding self consistently but still has some moderate spillage    Time 12    Period Weeks    Status Achieved      OT LONG TERM GOAL #2   Title Pt will increase hand strength in right by 5# to be able to cut food with  modified independence.    Baseline unable to cut meat at evaluation, 10th visit starting to work on cutting meat, patient reports difficulty with cutting meat    Time 12    Period Weeks    Status Achieved      OT LONG TERM GOAL #3   Title Patient will demonstrate increased awareness and strategies to attend to right side to hold items without dropping items.    Baseline difficulty with detecting items in right hand at eval, forgets items are in hand, 10th visit improved but still working on 12-30-2019: minimal dropping of items this date.  20th visit continued dropping of items occasionally but with increased awareness    Time 12    Period Weeks    Status On-going      OT LONG TERM GOAL #4   Title Patient will demonstrate independence in home exercise program for ROM, strength and coordination of right hand.    Baseline eval-patient has not been performing exercises from last episode of care and demonstrates decline, 10th visit doing exercises at home and continue to modify program. 20th visit pt continues to engage in HEP with upgrades frequently    Time 12    Period Weeks    Status Partially Met      OT LONG TERM GOAL #6   Title Patient will demonstrate pouring a drink with the right hand with attention to right side and not knocking the cup over and spilling.      Baseline unable at eval, 10th visit starting to use right hand more and attempting to pour with right.  20th visit still has difficulty at times.    Time 12    Period Weeks    Status On-going                 Plan - 03/20/20 1840    Clinical Impression Statement Patient performs well with task which has an outcome, cues for isolated finger movements to move tiles, flip and turn.  Patient demonstrates some difficulty with finding a couple of tiles on his left side, as well as some difficulty with sequencing of letters at times when copying words.  Continue to work towards goals in plan of care to improve right hand  function, increased awareness and increased independence in daily tasks.    OT Occupational Profile and History Detailed Assessment- Review of Records and additional review of physical, cognitive, psychosocial history related to current functional performance    Occupational Profile and client  history currently impacting functional performance repeated falls, TBI, expressive aphasia, limited motion in right hand, lack of coordination from previous CVA    Occupational performance deficits (Please refer to evaluation for details): ADL's;IADL's;Leisure    Body Structure / Function / Physical Skills ADL;Dexterity;Flexibility;ROM;Strength;Vision;Balance;Coordination;FMC;IADL;Tone;Endurance;UE functional use;Mobility    Cognitive Skills Attention;Safety Awareness;Problem Solve    Psychosocial Skills Environmental  Adaptations;Routines and Behaviors;Habits;Interpersonal Interaction    Rehab Potential Good    Clinical Decision Making Several treatment options, min-mod task modification necessary    Comorbidities Affecting Occupational Performance: May have comorbidities impacting occupational performance    Modification or Assistance to Complete Evaluation  Min-Moderate modification of tasks or assist with assess necessary to complete eval    OT Frequency 2x / week    OT Duration 12 weeks    OT Treatment/Interventions Self-care/ADL training;Visual/perceptual remediation/compensation;DME and/or AE instruction;Patient/family education;Moist Heat;Therapeutic exercise;Manual Therapy;Therapeutic activities;Neuromuscular education    Consulted and Agree with Plan of Care Patient    Family Member Consulted wife, [name]           Patient will benefit from skilled therapeutic intervention in order to improve the following deficits and impairments:   Body Structure / Function / Physical Skills: ADL, Dexterity, Flexibility, ROM, Strength, Vision, Balance, Coordination, FMC, IADL, Tone, Endurance, UE functional  use, Mobility Cognitive Skills: Attention, Safety Awareness, Problem Solve Psychosocial Skills: Environmental  Adaptations, Routines and Behaviors, Habits, Interpersonal Interaction   Visit Diagnosis: Muscle weakness (generalized)  Other lack of coordination  Neurologic neglect syndrome  Unsteadiness on feet    Problem List Patient Active Problem List   Diagnosis Date Noted  . Sleep disturbance   . PAF (paroxysmal atrial fibrillation) ([place])   . Recurrent UTI   . Hypokalemia   . Hyponatremia   . Abnormal urine   . 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 Benign essential HTN   . Urinary retention   . History of gout   . Global aphasia   . SAH (subarachnoid hemorrhage) ([place])   . SDH (subdural hematoma) ([place])   . Lymphocytosis   . Subdural hemorrhage following injury [facility]) 05/12/2016   Idalee Foxworthy T Genevive Printup, OTR/L, CLT  Tarahji Ramthun 03/20/2020, 6:47 PM  [place] MAIN [facility] SERVICES [address] [place], [place], [address] Phone: [phone]   Fax:  [phone]  Name: James [name] MRN: [id number] Date of Birth: 1940-12-15

## 2020-03-23 ENCOUNTER — Encounter: Payer: Self-pay | Admitting: Occupational Therapy

## 2020-03-23 ENCOUNTER — Ambulatory Visit: Payer: PPO | Admitting: Occupational Therapy

## 2020-03-23 ENCOUNTER — Other Ambulatory Visit: Payer: Self-pay

## 2020-03-23 DIAGNOSIS — R2681 Unsteadiness on feet: Secondary | ICD-10-CM

## 2020-03-23 DIAGNOSIS — R278 Other lack of coordination: Secondary | ICD-10-CM

## 2020-03-23 DIAGNOSIS — M6281 Muscle weakness (generalized): Secondary | ICD-10-CM

## 2020-03-23 DIAGNOSIS — R414 Neurologic neglect syndrome: Secondary | ICD-10-CM

## 2020-03-23 NOTE — Therapy (Signed)
[place] MAIN [facility] SERVICES [address] [place], [place], [address] Phone: [phone]   Fax:  [phone]  Occupational Therapy Treatment  Patient Details  Name: James [name] MRN: [id number] Date of Birth: Oct 09, 1940 Referring Provider (OT): Joselyn Arrow   Encounter Date: 03/23/2020   OT End of Session - 03/23/20 1051    Visit Number 39    Number of Visits 28    Date for OT Re-Evaluation 04/03/20    OT Start Time 1055    OT Stop Time 1146    OT Time Calculation (min) 51 min    Activity Tolerance Patient tolerated treatment well    Behavior During Therapy [facility] for tasks assessed/performed           Past Medical History:  Diagnosis Date  . Alcohol abuse, in remission   . Atrial fibrillation ([place])   . Bilateral renal masses   . Closed right ankle fracture   . 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 Gait disorder   . Gout   . Hypertension   . Seizure disorder ([place])   . 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     Past Surgical History:  Procedure Laterality Date  . CRANIECTOMY FOR DEPRESSED SKULL FRACTURE  2009   with MRSA infection  . CRANIOTOMY     X 2  . HERNIA REPAIR     during childhood  . ORIF ANKLE FRACTURE Left 2012    There were no vitals filed for this visit.   Subjective Assessment - 03/23/20 1050    Subjective  Patient reports he had a good weekend, spent some time with friends eating at one of the parks on the [facility] and also played some [employer].    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  He suffered another fall in 2017.  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     Patient Stated Goals Patient indicates he wants to be stronger, use his right hand for daily tasks, be as independent as possible.     Currently in Pain? No/denies    Multiple Pain Sites No          Patient seen for task with focus on design copy with use of tetris style puzzle, patient initiating task with left hand, repeated cues to use right affected hand, patient will switch temporarily but then when focused on design, he automatically switches to left hand.  Max cues and therapist sitting next to patient to gently block left side to remind patient to use right  Unknotting task with medium nylon rope to incorporate bilateral hand use.  Patient required decreased cues for this task.   Bilateral task with use of cards to scan, sort, shuffle and place in sequential order.  Cues to use right hand to pick up cards.       Response to tx:  Patient required increased cues to utilize right hand today, did not initiate use with right and required repeated, max cues to discourage right hand use.  Therapist had to sit beside patient to block left hand use.  Patient struggled with this all session and wanted to use his left hand for all tasks.  Patient was very focused on tetris puzzle and the details of completing  the task rather than the right hand use.  Items placed to his right side to encourage visual scanning and attention to the right today.  After completion, switched to tasks that require bilateral hand use to reinforce right hand into tasks.  Pt dropping items frequently today but able to retrieve items without assist.  Will plan to perform progress update next session, discussed with wife to think about goals and any items that are better or are still giving patient difficulty.                    OT Education - 03/23/20 1647    Education provided Yes    Education Details visual perceptual skills, right sided attention and coordination.    Person(s) Educated Patient    Methods Explanation;Demonstration;Verbal cues    Comprehension Verbalized understanding;Returned demonstration;Verbal cues required                OT Long Term Goals - 02/12/20 1107      OT LONG TERM GOAL #1   Title Patient will demonstrate use of utensil in right hand for self feeding with modified independence with little to no spillage.     Baseline unable at eval to hold utensil, 10th visit holding utensil, some feeding. 20th visit, feeding self consistently but still has some moderate spillage    Time 12    Period Weeks    Status Achieved      OT LONG TERM GOAL #2   Title Pt will increase hand strength in right by 5# to be able to cut food with modified independence.    Baseline unable to cut meat at evaluation, 10th visit starting to work on cutting meat, patient reports difficulty with cutting meat    Time 12    Period Weeks    Status Achieved      OT LONG TERM GOAL #3   Title Patient will demonstrate increased awareness and strategies to attend to right side to hold items without dropping items.    Baseline difficulty with detecting items in right hand at eval, forgets items are in hand, 10th visit improved but still working on 12-30-2019: minimal dropping of items this date.  20th visit continued dropping of items occasionally but with increased awareness    Time 12    Period Weeks    Status On-going      OT LONG TERM GOAL #4   Title Patient will demonstrate independence in home exercise program for ROM, strength and coordination of right hand.    Baseline eval-patient has not been performing exercises from last episode of care and demonstrates decline, 10th visit doing exercises at home and continue to modify program. 20th visit pt continues to engage in HEP with upgrades frequently    Time 12    Period Weeks    Status Partially Met      OT LONG TERM GOAL #6   Title Patient will demonstrate pouring a drink with the right hand with attention to right side and not knocking the cup over and spilling.      Baseline unable at eval, 10th visit starting to use right hand more and attempting to pour with right.   20th visit still has difficulty at times.    Time 12    Period Weeks    Status On-going                 Plan - 03/23/20 1051    Clinical Impression Statement Patient  required increased cues to utilize right hand today, did not initiate use with right and required repeated, max cues to discourage right hand use.  Therapist had to sit beside patient to block left hand use.  Patient struggled with this all session and wanted to use his left hand for all tasks.  Patient was very focused on tetris puzzle and the details of completing the task rather than the right hand use.  Items placed to his right side to encourage visual scanning and attention to the right today.  After completion, switched to tasks that require bilateral hand use to reinforce right hand into tasks.  Pt dropping items frequently today but able to retrieve items without assist.  Will plan to perform progress update next session, discussed with wife to think about goals and any items that are better or are still giving patient difficulty.    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    Occupational performance deficits (Please refer to evaluation for details): ADL's;IADL's;Leisure    Body Structure / Function / Physical Skills ADL;Dexterity;Flexibility;ROM;Strength;Vision;Balance;Coordination;FMC;IADL;Tone;Endurance;UE functional use;Mobility    Cognitive Skills Attention;Safety Awareness;Problem Solve    Psychosocial Skills Environmental  Adaptations;Routines and Behaviors;Habits;Interpersonal Interaction    Rehab Potential Good    Clinical Decision Making Several treatment options, min-mod task  modification necessary    Comorbidities Affecting Occupational Performance: May have comorbidities impacting occupational performance    Modification or Assistance to Complete Evaluation  Min-Moderate modification of tasks or assist with assess necessary to complete eval    OT Frequency 2x / week    OT Duration 12 weeks    OT Treatment/Interventions Self-care/ADL training;Visual/perceptual remediation/compensation;DME and/or AE instruction;Patient/family education;Moist Heat;Therapeutic exercise;Manual Therapy;Therapeutic activities;Neuromuscular education    Consulted and Agree with Plan of Care Patient    Family Member Consulted wife, [name]           Patient will benefit from skilled therapeutic intervention in order to improve the following deficits and impairments:   Body Structure / Function / Physical Skills: ADL, Dexterity, Flexibility, ROM, Strength, Vision, Balance, Coordination, FMC, IADL, Tone, Endurance, UE functional use, Mobility Cognitive Skills: Attention, Safety Awareness, Problem Solve Psychosocial Skills: Environmental  Adaptations, Routines and Behaviors, Habits, Interpersonal Interaction   Visit Diagnosis: Muscle weakness (generalized)  Other lack of coordination  Neurologic neglect syndrome  Unsteadiness on feet    Problem List Patient Active Problem List   Diagnosis Date Noted  . Sleep disturbance   . PAF (paroxysmal atrial fibrillation) ([place])   . Recurrent UTI   . Hypokalemia   . Hyponatremia   . Abnormal urine   . 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 Benign essential HTN   . Urinary retention   . History of gout   . Global aphasia   . SAH (subarachnoid hemorrhage) ([place])   . SDH (subdural hematoma) ([place])   . Lymphocytosis   . Subdural hemorrhage following injury [facility]) 05/12/2016   Charo Philipp T Roschelle Calandra, OTR/L, CLT  Emit Kuenzel 03/23/2020, 4:57 PM  [place] MAIN [facility] SERVICES [address]  [place], [place], [address] Phone: [phone]   Fax:  [phone]  Name: James [name] MRN: [id number] Date of Birth: September 14, 1940

## 2020-03-25 ENCOUNTER — Other Ambulatory Visit: Payer: Self-pay

## 2020-03-25 ENCOUNTER — Encounter: Payer: Self-pay | Admitting: Occupational Therapy

## 2020-03-25 ENCOUNTER — Ambulatory Visit: Payer: PPO | Attending: Neurology | Admitting: Occupational Therapy

## 2020-03-25 DIAGNOSIS — R2681 Unsteadiness on feet: Secondary | ICD-10-CM | POA: Diagnosis not present

## 2020-03-25 DIAGNOSIS — M6281 Muscle weakness (generalized): Secondary | ICD-10-CM | POA: Insufficient documentation

## 2020-03-25 DIAGNOSIS — R414 Neurologic neglect syndrome: Secondary | ICD-10-CM | POA: Insufficient documentation

## 2020-03-25 DIAGNOSIS — R278 Other lack of coordination: Secondary | ICD-10-CM | POA: Diagnosis not present

## 2020-03-26 NOTE — Therapy (Signed)
[place] MAIN [facility] SERVICES [address] [place], [place], [address] Phone: [phone]   Fax:  [phone]  Occupational Therapy Treatment/Progress Update Reporting period from 02/12/2020 to 03/25/2020   Patient Details  Name: James [name] MRN: [id number] Date of Birth: 07-05-1941 Referring Provider (OT): Joselyn Arrow   Encounter Date: 03/25/2020   OT End of Session - 03/26/20 1208    Visit Number 40    Number of Visits 48    Date for OT Re-Evaluation 04/03/20    OT Start Time 1100    OT Stop Time 1147    OT Time Calculation (min) 47 min    Activity Tolerance Patient tolerated treatment well    Behavior During Therapy [facility] for tasks assessed/performed           Past Medical History:  Diagnosis Date  . Alcohol abuse, in remission   . Atrial fibrillation ([place])   . Bilateral renal masses   . Closed right ankle fracture   . 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 Gait disorder   . Gout   . Hypertension   . Seizure disorder ([place])   . 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     Past Surgical History:  Procedure Laterality Date  . CRANIECTOMY FOR DEPRESSED SKULL FRACTURE  2009   with MRSA infection  . CRANIOTOMY     X 2  . HERNIA REPAIR     during childhood  . ORIF ANKLE FRACTURE Left 2012    There were no vitals filed for this visit.   Subjective Assessment - 03/26/20 1207    Subjective  Patient indicating he is doing well.  Discussed progress with patient and wife along with recommendations.    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  He suffered another fall in 2017.  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     Patient Stated Goals Patient indicates he wants to be stronger, use his right hand for daily tasks, be as  independent as possible.    Currently in Pain? No/denies    Multiple Pain Sites No              [facility] OT Assessment - 03/25/20 1109      Coordination   Right 9 Hole Peg Test 4 min 27 sec    Left 9 Hole Peg Test 29      Hand Function   Right Hand Grip (lbs) 40    Right Hand Lateral Pinch 16 lbs    Right Hand 3 Point Pinch 17 lbs   slipping   Left Hand Grip (lbs) 62    Left Hand Lateral Pinch 20 lbs    Left 3 point pinch 18 lbs         Therex: Patient seen for progress update, refer to flowsheet above for measurements   Patient goals updated to reflect progress  Discussed progress, goals and measurements with patient and wife.    Self Care:   Patient able to complete basic self care with bathing and dressing with modified independence.  Patient demonstrates decreased balance with functional tasks and with transitional movements and remains a fall risk.  Patient declines PT at this time.  Discussed incorporating balance tasks in to OT treatments as they relate to  daily tasks at home and in the community, patient and wife agreeable.   Neuro: Reassessment of 9 hole peg test and patient improved on right by 20 secs.  Patient demonstrates difficulty with picking up small pegs from the cup and maintain a tip to tip prehension pattern, decreased manipulation skills to turn pegs.   Patient seen for additional manipulation tasks with use of [place] discs to turn and flip to opposite side, patient able to demonstrate with difficulty and requires repeated cues and therapist demo, some tactile cues given.   Response to tx:   Patient has continued to make good progress and able to show a decrease in 9 hole peg test by 20 secs.  Patient doing well at home with self care tasks however wife is concerned about patient's functional balance deficits, patient does not wish to see PT at this time.  He is agreeable to incorporate functional balance tasks into OT sessions, more tasks in standing and  as tasks relates to necessary daily activities.  Patient continues to benefit from skilled OT services to maximize safety and independence in daily tasks.                   OT Education - 03/26/20 1208    Education provided Yes    Education Details visual perceptual skills, right sided attention and coordination.    Person(s) Educated Patient    Methods Explanation;Demonstration;Verbal cues    Comprehension Verbalized understanding;Returned demonstration;Verbal cues required               OT Long Term Goals - 03/26/20 1209      OT LONG TERM GOAL #1   Title Patient will demonstrate use of utensil in right hand for self feeding with modified independence with little to no spillage.     Baseline unable at eval to hold utensil, 10th visit holding utensil, some feeding. 20th visit, feeding self consistently but still has some moderate spillage    Time 12    Period Weeks    Status Achieved      OT LONG TERM GOAL #2   Title Pt will increase hand strength in right by 5# to be able to cut food with modified independence.    Baseline unable to cut meat at evaluation, 10th visit starting to work on cutting meat, patient reports difficulty with cutting meat    Time 12    Period Weeks    Status Achieved      OT LONG TERM GOAL #3   Title Patient will demonstrate increased awareness and strategies to attend to right side to hold items without dropping items.    Baseline difficulty with detecting items in right hand at eval, forgets items are in hand, 10th visit improved but still working on 12-30-2019: minimal dropping of items this date.  20th visit continued dropping of items occasionally but with increased awareness    Time 12    Period Weeks    Status On-going    Target Date 04/03/20      OT LONG TERM GOAL #4   Title Patient will demonstrate independence in home exercise program for ROM, strength and coordination of right hand.    Baseline eval-patient has not been  performing exercises from last episode of care and demonstrates decline, 10th visit doing exercises at home and continue to modify program. 20th visit pt continues to engage in HEP with upgrades frequently    Time 12    Period Weeks  Status Partially Met    Target Date 04/03/20      OT LONG TERM GOAL #6   Title Patient will demonstrate pouring a drink with the right hand with attention to right side and not knocking the cup over and spilling.      Baseline unable at eval, 10th visit starting to use right hand more and attempting to pour with right.  20th visit still has difficulty at times.    Time 12    Period Weeks    Status On-going    Target Date 04/03/20                 Plan - 03/26/20 1209    Clinical Impression Statement Patient has continued to make good progress and able to show a decrease in 9 hole peg test by 20 secs.  Patient doing well at home with self care tasks however wife is concerned about patient's functional balance deficits, patient does not wish to see PT at this time.  He is agreeable to incorporate functional balance tasks into OT sessions, more tasks in standing and as tasks relates to necessary daily activities.  Patient continues to benefit from skilled OT services to maximize safety and independence in daily tasks.    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    Occupational performance deficits (Please refer to evaluation for details): ADL's;IADL's;Leisure    Body Structure / Function / Physical Skills ADL;Dexterity;Flexibility;ROM;Strength;Vision;Balance;Coordination;FMC;IADL;Tone;Endurance;UE functional use;Mobility     Cognitive Skills Attention;Safety Awareness;Problem Solve    Psychosocial Skills Environmental  Adaptations;Routines and Behaviors;Habits;Interpersonal Interaction    Rehab Potential Good    Clinical Decision Making Several treatment options, min-mod task modification necessary    Comorbidities Affecting Occupational Performance: May have comorbidities impacting occupational performance    Modification or Assistance to Complete Evaluation  Min-Moderate modification of tasks or assist with assess necessary to complete eval    OT Frequency 2x / week    OT Duration 12 weeks    OT Treatment/Interventions Self-care/ADL training;Visual/perceptual remediation/compensation;DME and/or AE instruction;Patient/family education;Moist Heat;Therapeutic exercise;Manual Therapy;Therapeutic activities;Neuromuscular education    Consulted and Agree with Plan of Care Patient    Family Member Consulted wife, [name]           Patient will benefit from skilled therapeutic intervention in order to improve the following deficits and impairments:   Body Structure / Function / Physical Skills: ADL, Dexterity, Flexibility, ROM, Strength, Vision, Balance, Coordination, FMC, IADL, Tone, Endurance, UE functional use, Mobility Cognitive Skills: Attention, Safety Awareness, Problem Solve Psychosocial Skills: Environmental  Adaptations, Routines and Behaviors, Habits, Interpersonal Interaction   Visit Diagnosis: Muscle weakness (generalized)  Other lack of coordination  Neurologic neglect syndrome  Unsteadiness on feet    Problem List Patient Active Problem List   Diagnosis Date Noted  . Sleep disturbance   . PAF (paroxysmal atrial fibrillation) ([place])   . Recurrent UTI   . Hypokalemia   . Hyponatremia   . Abnormal urine   . 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 Benign essential HTN   . Urinary retention   . History of gout   . Global aphasia   . SAH (subarachnoid hemorrhage) ([place])   .  SDH (subdural hematoma) ([place])   .  Lymphocytosis   . Subdural hemorrhage following injury [facility]) 05/12/2016   Jamespaul Secrist T Teddy Rebstock, OTR/L, CLT   Beva Remund 03/26/2020, 12:23 PM  [place] MAIN [facility] SERVICES [address] [place], [place], [address] Phone: [phone]   Fax:  [phone]  Name: James [name] MRN: [id number] Date of Birth: 07-20-1941

## 2020-04-01 ENCOUNTER — Ambulatory Visit: Payer: PPO | Admitting: Occupational Therapy

## 2020-04-01 ENCOUNTER — Other Ambulatory Visit: Payer: Self-pay

## 2020-04-01 DIAGNOSIS — M6281 Muscle weakness (generalized): Secondary | ICD-10-CM

## 2020-04-01 DIAGNOSIS — R278 Other lack of coordination: Secondary | ICD-10-CM

## 2020-04-01 DIAGNOSIS — R414 Neurologic neglect syndrome: Secondary | ICD-10-CM

## 2020-04-01 DIAGNOSIS — R2681 Unsteadiness on feet: Secondary | ICD-10-CM

## 2020-04-03 ENCOUNTER — Encounter: Payer: Self-pay | Admitting: Occupational Therapy

## 2020-04-03 NOTE — Therapy (Signed)
[place] MAIN [facility] SERVICES [address] [place], [place], [address] Phone: [phone]   Fax:  [phone]  Occupational Therapy Treatment  Patient Details  Name: James [name] MRN: [id number] Date of Birth: Dec 23, 1940 Referring Provider (OT): Joselyn Arrow   Encounter Date: 04/01/2020   OT End of Session - 04/03/20 0904    Visit Number 41    Number of Visits 48    Date for OT Re-Evaluation 04/03/20    OT Start Time 1106    OT Stop Time 1157    OT Time Calculation (min) 51 min    Activity Tolerance Patient tolerated treatment well    Behavior During Therapy [facility] for tasks assessed/performed           Past Medical History:  Diagnosis Date  . Alcohol abuse, in remission   . Atrial fibrillation ([place])   . Bilateral renal masses   . Closed right ankle fracture   . 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 Gait disorder   . Gout   . Hypertension   . Seizure disorder ([place])   . 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     Past Surgical History:  Procedure Laterality Date  . CRANIECTOMY FOR DEPRESSED SKULL FRACTURE  2009   with MRSA infection  . CRANIOTOMY     X 2  . HERNIA REPAIR     during childhood  . ORIF ANKLE FRACTURE Left 2012    There were no vitals filed for this visit.   Subjective Assessment - 04/03/20 0903    Subjective  Pt reports he is willing to continue to come to therapy 2 times a week and wants to work on balance more.    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  He suffered another fall in 2017.  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     Patient Stated Goals Patient indicates he wants to be stronger, use his right hand for daily tasks, be as independent as possible.    Currently in Pain? No/denies    Multiple  Pain Sites No           Patient seen this date for tasks to focus on balance with acts in standing and use of bilateral UEs, emphasis on right functional hand use.   Patient participating in using basket crate, cues for hand placement on handles and carrying across large room with min guard to obtain items from closet.  Patient loading crate with multiple containers and returning to table to remove containers, open each container with use of right hand with a variety of lid types and then closing all containers and repacking crate to return items to cabinet.  Shape tower activity in standing with shapes placed in crate on the floor and patient required to pick up items from crate with min guard for balance and cues for grasping pattern and body mechanics.  Patient performed first 2 levels in standing and then last 2 levels from seated position.  Functional mobility and balance task to return items to various areas of the gym with min guard.    Response to tx:   Patient requiring min guard for tasks in standing to work towards challenging and improving balance with functional household tasks.  Patient requires cues  at times for body mechanics when reaching to obtain items from floor and with transitional movements.  Patient tends to move quickly at times and performs best with slower controlled movements especially with movements requiring turning of the body, twisting or changing directions.  Patient required min cues to use right hand for many tasks and also benefitted from bilateral hand use for tasks requiring carrying items.  Continue OT to work towards goals in plan of care, will plan to do a reassessment and recertification next session.                       OT Education - 04/03/20 0904    Education provided Yes    Education Details tasks in standing, balance    Person(s) Educated Patient;Spouse    Methods Explanation;Demonstration;Verbal cues    Comprehension Verbalized  understanding;Returned demonstration;Verbal cues required               OT Long Term Goals - 03/26/20 1209      OT LONG TERM GOAL #1   Title Patient will demonstrate use of utensil in right hand for self feeding with modified independence with little to no spillage.     Baseline unable at eval to hold utensil, 10th visit holding utensil, some feeding. 20th visit, feeding self consistently but still has some moderate spillage    Time 12    Period Weeks    Status Achieved      OT LONG TERM GOAL #2   Title Pt will increase hand strength in right by 5# to be able to cut food with modified independence.    Baseline unable to cut meat at evaluation, 10th visit starting to work on cutting meat, patient reports difficulty with cutting meat    Time 12    Period Weeks    Status Achieved      OT LONG TERM GOAL #3   Title Patient will demonstrate increased awareness and strategies to attend to right side to hold items without dropping items.    Baseline difficulty with detecting items in right hand at eval, forgets items are in hand, 10th visit improved but still working on 12-30-2019: minimal dropping of items this date.  20th visit continued dropping of items occasionally but with increased awareness    Time 12    Period Weeks    Status On-going    Target Date 04/03/20      OT LONG TERM GOAL #4   Title Patient will demonstrate independence in home exercise program for ROM, strength and coordination of right hand.    Baseline eval-patient has not been performing exercises from last episode of care and demonstrates decline, 10th visit doing exercises at home and continue to modify program. 20th visit pt continues to engage in HEP with upgrades frequently    Time 12    Period Weeks    Status Partially Met    Target Date 04/03/20      OT LONG TERM GOAL #6   Title Patient will demonstrate pouring a drink with the right hand with attention to right side and not knocking the cup over and  spilling.      Baseline unable at eval, 10th visit starting to use right hand more and attempting to pour with right.  20th visit still has difficulty at times.    Time 12    Period Weeks    Status On-going    Target Date 04/03/20  Plan - 04/03/20 0905    Clinical Impression Statement Patient requiring min guard for tasks in standing to work towards challenging and improving balance with functional household tasks.  Patient requires cues at times for body mechanics when reaching to obtain items from floor and with transitional movements.  Patient tends to move quickly at times and performs best with slower controlled movements especially with movements requiring turning of the body, twisting or changing directions.  Patient required min cues to use right hand for many tasks and also benefitted from bilateral hand use for tasks requiring carrying items.  Continue OT to work towards goals in plan of care, will plan to do a reassessment and recertification next session.    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    Occupational performance deficits (Please refer to evaluation for details): ADL's;IADL's;Leisure    Body Structure / Function / Physical Skills ADL;Dexterity;Flexibility;ROM;Strength;Vision;Balance;Coordination;FMC;IADL;Tone;Endurance;UE functional use;Mobility    Cognitive Skills Attention;Safety Awareness;Problem Solve    Psychosocial Skills Environmental  Adaptations;Routines and Behaviors;Habits;Interpersonal Interaction    Rehab Potential Good    Clinical Decision Making Several treatment options, min-mod task modification  necessary    Comorbidities Affecting Occupational Performance: May have comorbidities impacting occupational performance    Modification or Assistance to Complete Evaluation  Min-Moderate modification of tasks or assist with assess necessary to complete eval    OT Frequency 2x / week    OT Duration 12 weeks    OT Treatment/Interventions Self-care/ADL training;Visual/perceptual remediation/compensation;DME and/or AE instruction;Patient/family education;Moist Heat;Therapeutic exercise;Manual Therapy;Therapeutic activities;Neuromuscular education    Consulted and Agree with Plan of Care Patient    Family Member Consulted wife, [name]           Patient will benefit from skilled therapeutic intervention in order to improve the following deficits and impairments:   Body Structure / Function / Physical Skills: ADL, Dexterity, Flexibility, ROM, Strength, Vision, Balance, Coordination, FMC, IADL, Tone, Endurance, UE functional use, Mobility Cognitive Skills: Attention, Safety Awareness, Problem Solve Psychosocial Skills: Environmental  Adaptations, Routines and Behaviors, Habits, Interpersonal Interaction   Visit Diagnosis: Muscle weakness (generalized)  Other lack of coordination  Neurologic neglect syndrome  Unsteadiness on feet    Problem List Patient Active Problem List   Diagnosis Date Noted  . Sleep disturbance   . PAF (paroxysmal atrial fibrillation) ([place])   . Recurrent UTI   . Hypokalemia   . Hyponatremia   . Abnormal urine   . 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 Benign essential HTN   . Urinary retention   . History of gout   . Global aphasia   . SAH (subarachnoid hemorrhage) ([place])   . SDH (subdural hematoma) ([place])   . Lymphocytosis   . Subdural hemorrhage following injury ([place]) 05/12/2016   Alajiah Dutkiewicz T Anneke Cundy, OTR/L, CLT  Eleasha Cataldo 04/03/2020, 9:16 AM  [place] MAIN [facility] SERVICES [place], [place], [address] Phone: [phone]   Fax:  [phone]  Name: James [name] MRN: [id number] Date of Birth: 24-Dec-1940

## 2020-04-06 ENCOUNTER — Ambulatory Visit: Payer: PPO | Admitting: Occupational Therapy

## 2020-04-06 ENCOUNTER — Other Ambulatory Visit: Payer: Self-pay

## 2020-04-06 DIAGNOSIS — M6281 Muscle weakness (generalized): Secondary | ICD-10-CM | POA: Diagnosis not present

## 2020-04-06 DIAGNOSIS — R414 Neurologic neglect syndrome: Secondary | ICD-10-CM

## 2020-04-06 DIAGNOSIS — R2681 Unsteadiness on feet: Secondary | ICD-10-CM

## 2020-04-06 DIAGNOSIS — R278 Other lack of coordination: Secondary | ICD-10-CM

## 2020-04-08 ENCOUNTER — Other Ambulatory Visit: Payer: Self-pay

## 2020-04-08 ENCOUNTER — Ambulatory Visit: Payer: PPO | Admitting: Occupational Therapy

## 2020-04-08 DIAGNOSIS — R278 Other lack of coordination: Secondary | ICD-10-CM

## 2020-04-08 DIAGNOSIS — R414 Neurologic neglect syndrome: Secondary | ICD-10-CM

## 2020-04-08 DIAGNOSIS — M6281 Muscle weakness (generalized): Secondary | ICD-10-CM | POA: Diagnosis not present

## 2020-04-08 DIAGNOSIS — R2681 Unsteadiness on feet: Secondary | ICD-10-CM

## 2020-04-10 ENCOUNTER — Encounter: Payer: Self-pay | Admitting: Occupational Therapy

## 2020-04-10 NOTE — Therapy (Signed)
[place] [facility] MAIN [facility] SERVICES [address] [place], [place], [address] Phone: [phone]   Fax:  [phone]  Occupational Therapy Treatment/Recertification   Patient Details  Name: James [name] MRN: [id number] Date of Birth: January 27, 1941 Referring Provider (OT): Steele Sizer   Encounter Date: 04/06/2020   OT End of Session - 04/09/20 1554    Visit Number 42    Number of Visits 72    Date for OT Re-Evaluation 06/29/20    OT Start Time 1100    OT Stop Time 1145    OT Time Calculation (min) 45 min    Activity Tolerance Patient tolerated treatment well    Behavior During Therapy [facility] for tasks assessed/performed           Past Medical History:  Diagnosis Date  . Alcohol abuse, in remission   . Atrial fibrillation ([facility])   . Bilateral renal masses   . Closed right ankle fracture   . 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 Gait disorder   . Gout   . Hypertension   . Seizure disorder ([facility])   . 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     Past Surgical History:  Procedure Laterality Date  . CRANIECTOMY FOR DEPRESSED SKULL FRACTURE  2009   with MRSA infection  . CRANIOTOMY     X 2  . HERNIA REPAIR     during childhood  . ORIF ANKLE FRACTURE Left 2012    There were no vitals filed for this visit.   Subjective Assessment - 04/10/20 1553    Subjective  Patient reports he is doing well, wants to work on balance again today, did have a fall at home on his knees and scraped left knee.    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  He suffered another fall in 2017.  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     Patient Stated Goals Patient indicates he wants to be stronger, use his right hand for daily tasks, be as independent as possible.     Currently in Pain? No/denies    Multiple Pain Sites No              [facility] OT Assessment - 04/09/20 1558      Assessment   Medical Diagnosis CVA/right UE weakness    Referring Provider (OT) Sherryll Burger, H    Onset Date/Surgical Date 02/21/18    Hand Dominance Right      Coordination   Right 9 Hole Peg Test 4 min 5 sec    Left 9 Hole Peg Test 29      Hand Function   Right Hand Grip (lbs) 42    Right Hand Lateral Pinch 17 lbs    Right Hand 3 Point Pinch 17 lbs   slipping    Left Hand Grip (lbs) 62    Left Hand Lateral Pinch 20 lbs    Left 3 point pinch 18 lbs           Patient seen this date for reassessment of functional hand skills and physical performance, see above flow sheet for measurements.   Patient continues to demonstrate balance deficits and has frequent falls.    Neuromuscular reeducation:    Patient seen for task of manipulation of washers from  flat surface in standing, unable to pick up from flat surface but requires using index finger in isolation to move item to edge of the table and then use tip to tip prehension to pickup.  Difficulty with this task and requires cues and therapist demo.  Continues to drop items frequently.  Min guard for balance to pick up washers from the floor with use of left hand. Placing washers onto magnetic hooks in elevated plane of motion while in standing.  Removing washers from seated position and returning to a magnetic bowl.    Response to tx:   Patient has continued to make progress with right hand function, improved grip strength, pinch and slightly improved time on 9 hole peg test.  He continues to demonstrate limitations in coordination with right hand but is using hand more during self care and daily tasks at home and in the clinic.  Continues to work towards improved prehension patterns with tip to tip and 3 point pinch patterns to pick up and manipulate objects.  Balance remains an issue and patient has had falls recently, he does  not want to work directly with PT at this time but willing to work on balance as they relate to functional tasks with OT.  Patient continues to benefit from skilled OT services to maximize safety and independence in necessary daily tasks.                    OT Education - 04/10/20 1554    Education provided Yes    Education Details tasks in standing, balance, reducing fall risk, [place]    Person(s) Educated Patient;Spouse    Methods Explanation;Demonstration;Verbal cues    Comprehension Verbalized understanding;Returned demonstration;Verbal cues required               OT Long Term Goals - 04/10/20 1641      OT LONG TERM GOAL #1   Title Patient will demonstrate use of utensil in right hand for self feeding with modified independence with little to no spillage.     Baseline unable at eval to hold utensil, 10th visit holding utensil, some feeding. 20th visit, feeding self consistently but still has some moderate spillage    Time 12    Period Weeks    Status Achieved      OT LONG TERM GOAL #2   Title Pt will increase hand strength in right by 5# to be able to cut food with modified independence.    Baseline unable to cut meat at evaluation, 10th visit starting to work on cutting meat, patient reports difficulty with cutting meat    Time 12    Period Weeks    Status Achieved      OT LONG TERM GOAL #3   Title Patient will demonstrate increased awareness and strategies to attend to right side to hold items without dropping items.    Baseline difficulty with detecting items in right hand at eval, forgets items are in hand, 10th visit improved but still working on 12-30-2019: minimal dropping of items this date.  20th visit continued dropping of items occasionally but with increased awareness    Time 12    Period Weeks    Status On-going    Target Date 06/29/20      OT LONG TERM GOAL #4   Title Patient will demonstrate independence in home exercise program for ROM, strength  and coordination of right hand.    Baseline eval-patient has not been performing exercises from  last episode of care and demonstrates decline, 10th visit doing exercises at home and continue to modify program. 20th visit pt continues to engage in HEP with upgrades frequently    Time 12    Period Weeks    Status Partially Met    Target Date 06/29/20      OT LONG TERM GOAL #5   Title Pt to demonstrate improved balance with transitional movements with sit to stand from all surfaces with no loss of balance to reduce fall risk.    Time 12    Period Weeks    Status New    Target Date 06/29/20      OT LONG TERM GOAL #6   Title Patient will demonstrate pouring a drink with the right hand with attention to right side and not knocking the cup over and spilling.      Baseline unable at eval, 10th visit starting to use right hand more and attempting to pour with right.  20th visit still has difficulty at times.    Time 12    Period Weeks    Status On-going    Target Date 06/29/20                 Plan - 04/09/20 1557    Clinical Impression Statement Patient has continued to make progress with right hand function, improved grip strength, pinch and slightly improved time on 9 hole peg test.  He continues to demonstrate limitations in coordination with right hand but is using hand more during self care and daily tasks at home and in the clinic.  Continues to work towards improved prehension patterns with tip to tip and 3 point pinch patterns to pick up and manipulate objects.  Balance remains an issue and patient has had falls recently, he does not want to work directly with PT at this time but willing to work on balance as they relate to functional tasks with OT.  Patient continues to benefit from skilled OT services to maximize safety and independence in necessary daily tasks.    OT Occupational Profile and History Detailed Assessment- Review of Records and additional review of physical,  cognitive, psychosocial history related to current functional performance    Occupational Profile and client history currently impacting functional performance repeated falls, TBI, expressive aphasia, limited motion in right hand, lack of coordination from previous CVA    Occupational performance deficits (Please refer to evaluation for details): ADL's;IADL's;Leisure    Body Structure / Function / Physical Skills ADL;Dexterity;Flexibility;ROM;Strength;Vision;Balance;Coordination;FMC;IADL;Tone;Endurance;UE functional use;Mobility    Cognitive Skills Attention;Safety Awareness;Problem Solve    Psychosocial Skills Environmental  Adaptations;Routines and Behaviors;Habits;Interpersonal Interaction    Rehab Potential Good    Clinical Decision Making Several treatment options, min-mod task modification necessary    Comorbidities Affecting Occupational Performance: May have comorbidities impacting occupational performance    Modification or Assistance to Complete Evaluation  Min-Moderate modification of tasks or assist with assess necessary to complete eval    OT Frequency 2x / week    OT Duration 12 weeks    OT Treatment/Interventions Self-care/ADL training;Visual/perceptual remediation/compensation;DME and/or AE instruction;Patient/family education;Moist Heat;Therapeutic exercise;Manual Therapy;Therapeutic activities;Neuromuscular education    Consulted and Agree with Plan of Care Patient    Family Member Consulted wife, [name]           Patient will benefit from skilled therapeutic intervention in order to improve the following deficits and impairments:   Body Structure / Function / Physical Skills: ADL, Dexterity, Flexibility, ROM, Strength, Vision, Balance, Coordination,  [place], IADL, Tone, Endurance, UE functional use, Mobility Cognitive Skills: Attention, Safety Awareness, Problem Solve Psychosocial Skills: Environmental  Adaptations, Routines and Behaviors, Habits, Interpersonal  Interaction   Visit Diagnosis: Muscle weakness (generalized)  Other lack of coordination  Neurologic neglect syndrome  Unsteadiness on feet    Problem List Patient Active Problem List   Diagnosis Date Noted  . Sleep disturbance   . PAF (paroxysmal atrial fibrillation) ([place])   . Recurrent UTI   . Hypokalemia   . Hyponatremia   . Abnormal urine   . 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 Benign essential HTN   . Urinary retention   . History of gout   . Global aphasia   . SAH (subarachnoid hemorrhage) ([place])   . SDH (subdural hematoma) ([place])   . Lymphocytosis   . Subdural hemorrhage following injury [facility]) 05/12/2016   Catharine Kettlewell T Vivien Barretto, OTR/L, CLT  Analiz Tvedt 04/10/2020, 4:44 PM  [place] MAIN [facility] SERVICES [address] [place], [place], [address] Phone: [phone]   Fax:  [phone]  Name: James [name] MRN: [id number] Date of Birth: [birth year]

## 2020-04-11 ENCOUNTER — Encounter: Payer: Self-pay | Admitting: Occupational Therapy

## 2020-04-11 NOTE — Therapy (Signed)
[place] MAIN [facility] SERVICES [address] [place], [place], [address] Phone: [phone]   Fax:  [phone]  Occupational Therapy Treatment  Patient Details  Name: James [name] MRN: [id number] Date of Birth: 1941-01-16 Referring Provider (OT): Joselyn Arrow   Encounter Date: 04/08/2020   OT End of Session - 04/10/20 2102    Visit Number 43    Number of Visits 36    Date for OT Re-Evaluation 06/29/20    OT Start Time 1100    OT Stop Time 1147    OT Time Calculation (min) 47 min    Activity Tolerance Patient tolerated treatment well    Behavior During Therapy [place] for tasks assessed/performed           Past Medical History:  Diagnosis Date   Alcohol abuse, in remission    Atrial fibrillation ([place])    Bilateral renal masses    Closed right ankle fracture    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     Past Surgical History:  Procedure Laterality Date   CRANIECTOMY FOR DEPRESSED SKULL FRACTURE  2009   with MRSA infection   CRANIOTOMY     X 2   HERNIA REPAIR     during childhood   ORIF ANKLE FRACTURE Left 2012    There were no vitals filed for this visit.   Subjective Assessment - 04/11/20 2101    Subjective  Pt ready to work, smiling.  Wife reports they may be going to the beach in october for a week.    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  He suffered another fall in 2017.  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     Patient Stated Goals Patient indicates he wants to be stronger, use his right hand for daily tasks, be as independent as possible.    Currently in Pain? No/denies    Multiple Pain Sites  No          Neuromuscular Reeducation:  Perfection game with sorting and placing various shapes with and without the 60 second timer.   Moderate cues to use right hand when performing task.   Focused on tip to tip (index and thumb) prehension pattern to hold onto stem of objects.   Decreased ability to perform manipulation skills to turn objects with right hand.     Response to tx: Pt performs well with tasks which require increased focus cognitively, however, as he is focused on the process of the task, he tends to forget to use his right hand and will initiate task with left hand.  Requires cues to utilize right hand during task and once provided with multiple cues, he will demonstrate improved consistency and decreased cues.  Continued difficultly with tip to tip prehension patterns and prefers a lateral pinch pattern when attempting to pick up objects.  Decreased manipulation skills for small objects and had difficulty with prehension pattern to pick up objects from small stem.  Continue to work towards goals in plan of care to improve right functional hand use for daily tasks.  OT Education - 04/11/20 2102    Education provided Yes    Education Details tasks in standing, balance, reducing fall risk, [place]    Person(s) Educated Patient;Spouse    Methods Explanation;Demonstration;Verbal cues    Comprehension Verbalized understanding;Returned demonstration;Verbal cues required               OT Long Term Goals - 04/10/20 1641      OT LONG TERM GOAL #1   Title Patient will demonstrate use of utensil in right hand for self feeding with modified independence with little to no spillage.     Baseline unable at eval to hold utensil, 10th visit holding utensil, some feeding. 20th visit, feeding self consistently but still has some moderate spillage    Time 12    Period Weeks    Status Achieved      OT LONG TERM GOAL #2   Title Pt will increase hand  strength in right by 5# to be able to cut food with modified independence.    Baseline unable to cut meat at evaluation, 10th visit starting to work on cutting meat, patient reports difficulty with cutting meat    Time 12    Period Weeks    Status Achieved      OT LONG TERM GOAL #3   Title Patient will demonstrate increased awareness and strategies to attend to right side to hold items without dropping items.    Baseline difficulty with detecting items in right hand at eval, forgets items are in hand, 10th visit improved but still working on 12-30-2019: minimal dropping of items this date.  20th visit continued dropping of items occasionally but with increased awareness    Time 12    Period Weeks    Status On-going    Target Date 06/29/20      OT LONG TERM GOAL #4   Title Patient will demonstrate independence in home exercise program for ROM, strength and coordination of right hand.    Baseline eval-patient has not been performing exercises from last episode of care and demonstrates decline, 10th visit doing exercises at home and continue to modify program. 20th visit pt continues to engage in HEP with upgrades frequently    Time 12    Period Weeks    Status Partially Met    Target Date 06/29/20      OT LONG TERM GOAL #5   Title Pt to demonstrate improved balance with transitional movements with sit to stand from all surfaces with no loss of balance to reduce fall risk.    Time 12    Period Weeks    Status New    Target Date 06/29/20      OT LONG TERM GOAL #6   Title Patient will demonstrate pouring a drink with the right hand with attention to right side and not knocking the cup over and spilling.      Baseline unable at eval, 10th visit starting to use right hand more and attempting to pour with right.  20th visit still has difficulty at times.    Time 12    Period Weeks    Status On-going    Target Date 06/29/20                 Plan - 04/11/20 2103    Clinical  Impression Statement Pt performs well with tasks which require increased focus cognitively, however, as he is focused on the process of the task, he tends to forget to use  his right hand and will initiate task with left hand.  Requires cues to utilize right hand during task and once provided with multiple cues, he will demonstrate improved consistency and decreased cues.  Continued difficultly with tip to tip prehension patterns and prefers a lateral pinch pattern when attempting to pick up objects.  Decreased manipulation skills for small objects and had difficulty with prehension pattern to pick up objects from small stem.  Continue to work towards goals in plan of care to improve right functional hand use for daily tasks.    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    Occupational performance deficits (Please refer to evaluation for details): ADL's;IADL's;Leisure    Body Structure / Function / Physical Skills ADL;Dexterity;Flexibility;ROM;Strength;Vision;Balance;Coordination;FMC;IADL;Tone;Endurance;UE functional use;Mobility    Cognitive Skills Attention;Safety Awareness;Problem Solve    Psychosocial Skills Environmental  Adaptations;Routines and Behaviors;Habits;Interpersonal Interaction    Rehab Potential Good    Clinical Decision Making Several treatment options, min-mod task modification necessary    Comorbidities Affecting Occupational Performance: May have comorbidities impacting occupational performance    Modification or Assistance to Complete Evaluation  Min-Moderate modification of tasks or assist with assess necessary to complete eval    OT Frequency 2x /  week    OT Duration 12 weeks    OT Treatment/Interventions Self-care/ADL training;Visual/perceptual remediation/compensation;DME and/or AE instruction;Patient/family education;Moist Heat;Therapeutic exercise;Manual Therapy;Therapeutic activities;Neuromuscular education    Consulted and Agree with Plan of Care Patient    Family Member Consulted wife, [name]           Patient will benefit from skilled therapeutic intervention in order to improve the following deficits and impairments:   Body Structure / Function / Physical Skills: ADL, Dexterity, Flexibility, ROM, Strength, Vision, Balance, Coordination, FMC, IADL, Tone, Endurance, UE functional use, Mobility Cognitive Skills: Attention, Safety Awareness, Problem Solve Psychosocial Skills: Environmental  Adaptations, Routines and Behaviors, Habits, Interpersonal Interaction   Visit Diagnosis: Muscle weakness (generalized)  Other lack of coordination  Neurologic neglect syndrome  Unsteadiness on feet    Problem List Patient Active Problem List   Diagnosis Date Noted   Sleep disturbance    PAF (paroxysmal atrial fibrillation) (HCC)    Recurrent UTI    Hypokalemia    Hyponatremia    Abnormal urine    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 [place], [place], [address] Phone: [phone]   Fax:  [phone]  Name: James [name] MRN: [id number] Date of Birth: 1940/08/12

## 2020-04-13 ENCOUNTER — Encounter: Payer: Self-pay | Admitting: Occupational Therapy

## 2020-04-13 ENCOUNTER — Other Ambulatory Visit: Payer: Self-pay

## 2020-04-13 ENCOUNTER — Ambulatory Visit: Payer: PPO | Admitting: Occupational Therapy

## 2020-04-13 DIAGNOSIS — R2681 Unsteadiness on feet: Secondary | ICD-10-CM

## 2020-04-13 DIAGNOSIS — R278 Other lack of coordination: Secondary | ICD-10-CM

## 2020-04-13 DIAGNOSIS — M6281 Muscle weakness (generalized): Secondary | ICD-10-CM | POA: Diagnosis not present

## 2020-04-13 DIAGNOSIS — R414 Neurologic neglect syndrome: Secondary | ICD-10-CM

## 2020-04-13 NOTE — Therapy (Signed)
[place] MAIN [facility] SERVICES [address] [place], [place], [address] Phone: [phone]   Fax:  [phone]  Occupational Therapy Treatment  Patient Details  Name: James [name] MRN: [id number] Date of Birth: 1940-11-25 Referring Provider (OT): Joselyn Arrow   Encounter Date: 04/13/2020   OT End of Session - 04/15/20 1532    Visit Number 44    Number of Visits 72    Date for OT Re-Evaluation 06/29/20    OT Start Time 1101    OT Stop Time 1147    OT Time Calculation (min) 46 min    Activity Tolerance Patient tolerated treatment well    Behavior During Therapy [facility] for tasks assessed/performed           Past Medical History:  Diagnosis Date  . Alcohol abuse, in remission   . Atrial fibrillation ([place])   . Bilateral renal masses   . Closed right ankle fracture   . 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 Gait disorder   . Gout   . Hypertension   . Seizure disorder ([place])   . 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     Past Surgical History:  Procedure Laterality Date  . CRANIECTOMY FOR DEPRESSED SKULL FRACTURE  2009   with MRSA infection  . CRANIOTOMY     X 2  . HERNIA REPAIR     during childhood  . ORIF ANKLE FRACTURE Left 2012    There were no vitals filed for this visit.   Subjective Assessment - 04/15/20 1531    Subjective  Wife states, "We all need to talk", indicating patient is not sure if he wants to continue to come to therapy.  Wife has seen good progress with patient and his right hand use over time with therapy and also admits patient had a huge decline in status when he was away from therapy last year during [place] restrictions.  She doesnt feel she can continue to take him out for outings if he is not willing to do therapy since he is a greater fall risk, his ability to self feeds declines when not in  therapy and will have to make arrangements for a sitter to come in if patient declines like the past episodes.   Discussed with patient and wife regarding therapy, progress and the need to be consistent with home program.  Patient agrees he is able to see progress and benefits from therapy but just gets frustrated at times and tired of coming at times.  Discussed the repercussions of not attending therapy and continued focus on right hand retraining and he is agreeable to come for the next few weeks and reassess after a vacation in October.  He admits to balance deficits and continued fall risk, issues with hand during self care.    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  He suffered another fall in 2017.  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     Patient Stated Goals Patient indicates he wants to be stronger, use his right hand for daily tasks, be as independent as possible.           See subjective section for conversation details with wife and patient.  Self care/ADL tasks:  Use of Balance pad to challenge  core and balance while performing towel folding and placing pillows into cases. Min guard to min assist 1 loss of balance with self recovery Handwriting with name on large lined paper, name and address Improved performance with use of lines to guide writing and letter size.   Patient placing Laundry in bin with supervision.     Response to tx:   Patient with expressive aphasia and has difficulty with communicating his thoughts about therapy, wife in during session to discuss plan of care and status.  Patient able to state yes and no to questions and is aware of his limitations and implications of discontinuing therapy.  He is agreeable to continue with treatments over the next 2-3 weeks and then reassess performance and needs again.  Focused this date on self care/IADL tasks incorporated with  functional balance deficits. Patient continues to benefit from skilled OT services to maximize safety and independence in necessary daily tasks.                   OT Education - 04/15/20 1532    Education provided Yes    Education Details standing balance while folding laundry    Person(s) Educated Patient;Spouse    Methods Explanation;Demonstration;Verbal cues    Comprehension Verbalized understanding;Returned demonstration;Verbal cues required               OT Long Term Goals - 04/10/20 1641      OT LONG TERM GOAL #1   Title Patient will demonstrate use of utensil in right hand for self feeding with modified independence with little to no spillage.     Baseline unable at eval to hold utensil, 10th visit holding utensil, some feeding. 20th visit, feeding self consistently but still has some moderate spillage    Time 12    Period Weeks    Status Achieved      OT LONG TERM GOAL #2   Title Pt will increase hand strength in right by 5# to be able to cut food with modified independence.    Baseline unable to cut meat at evaluation, 10th visit starting to work on cutting meat, patient reports difficulty with cutting meat    Time 12    Period Weeks    Status Achieved      OT LONG TERM GOAL #3   Title Patient will demonstrate increased awareness and strategies to attend to right side to hold items without dropping items.    Baseline difficulty with detecting items in right hand at eval, forgets items are in hand, 10th visit improved but still working on 12-30-2019: minimal dropping of items this date.  20th visit continued dropping of items occasionally but with increased awareness    Time 12    Period Weeks    Status On-going    Target Date 06/29/20      OT LONG TERM GOAL #4   Title Patient will demonstrate independence in home exercise program for ROM, strength and coordination of right hand.    Baseline eval-patient has not been performing exercises from last  episode of care and demonstrates decline, 10th visit doing exercises at home and continue to modify program. 20th visit pt continues to engage in HEP with upgrades frequently    Time 12    Period Weeks    Status Partially Met    Target Date 06/29/20      OT LONG TERM GOAL #5   Title Pt to demonstrate improved balance with transitional movements with sit to stand  from all surfaces with no loss of balance to reduce fall risk.    Time 12    Period Weeks    Status New    Target Date 06/29/20      OT LONG TERM GOAL #6   Title Patient will demonstrate pouring a drink with the right hand with attention to right side and not knocking the cup over and spilling.      Baseline unable at eval, 10th visit starting to use right hand more and attempting to pour with right.  20th visit still has difficulty at times.    Time 12    Period Weeks    Status On-going    Target Date 06/29/20                 Plan - 04/15/20 1532    Clinical Impression Statement Patient with expressive aphasia and has difficulty with communicating his thoughts about therapy, wife in during session to discuss plan of care and status.  Patient able to state yes and no to questions and is aware of his limitations and implications of discontinuing therapy.  He is agreeable to continue with treatments over the next 2-3 weeks and then reassess performance and needs again.  Focused this date on self care/IADL tasks incorporated with functional balance deficits. Patient continues to benefit from skilled OT services to maximize safety and independence in necessary daily tasks.    OT Occupational Profile and History Detailed Assessment- Review of Records and additional review of physical, cognitive, psychosocial history related to current functional performance    Occupational Profile and client history currently impacting functional performance repeated falls, TBI, expressive aphasia, limited motion in right hand, lack of  coordination from previous CVA    Occupational performance deficits (Please refer to evaluation for details): ADL's;IADL's;Leisure    Body Structure / Function / Physical Skills ADL;Dexterity;Flexibility;ROM;Strength;Vision;Balance;Coordination;FMC;IADL;Tone;Endurance;UE functional use;Mobility    Cognitive Skills Attention;Safety Awareness;Problem Solve    Psychosocial Skills Environmental  Adaptations;Routines and Behaviors;Habits;Interpersonal Interaction    Rehab Potential Good    Clinical Decision Making Several treatment options, min-mod task modification necessary    Comorbidities Affecting Occupational Performance: May have comorbidities impacting occupational performance    Modification or Assistance to Complete Evaluation  Min-Moderate modification of tasks or assist with assess necessary to complete eval    OT Frequency 2x / week    OT Duration 12 weeks    OT Treatment/Interventions Self-care/ADL training;Visual/perceptual remediation/compensation;DME and/or AE instruction;Patient/family education;Moist Heat;Therapeutic exercise;Manual Therapy;Therapeutic activities;Neuromuscular education    Consulted and Agree with Plan of Care Patient    Family Member Consulted wife, [name]           Patient will benefit from skilled therapeutic intervention in order to improve the following deficits and impairments:   Body Structure / Function / Physical Skills: ADL, Dexterity, Flexibility, ROM, Strength, Vision, Balance, Coordination, FMC, IADL, Tone, Endurance, UE functional use, Mobility Cognitive Skills: Attention, Safety Awareness, Problem Solve Psychosocial Skills: Environmental  Adaptations, Routines and Behaviors, Habits, Interpersonal Interaction   Visit Diagnosis: Muscle weakness (generalized)  Other lack of coordination  Neurologic neglect syndrome  Unsteadiness on feet    Problem List Patient Active Problem List   Diagnosis Date Noted  . Sleep disturbance   . PAF  (paroxysmal atrial fibrillation) ([place])   . Recurrent UTI   . Hypokalemia   . Hyponatremia   . Abnormal urine   . 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 Benign essential HTN   .  Urinary retention   . History of gout   . Global aphasia   . SAH (subarachnoid hemorrhage) ([place])   . SDH (subdural hematoma) ([place])   . Lymphocytosis   . Subdural hemorrhage following injury [facility]) 05/12/2016   Shawon Denzer T Jeyden Coffelt, OTR/L, CLT  Rashawd Laskaris 04/16/2020, 3:39 PM  [place] MAIN [facility] SERVICES [address] [place], [place], [address] Phone: [phone]   Fax:  [phone]  Name: Osama Coleson MRN: [id number] Date of Birth: 1940-07-28

## 2020-04-15 ENCOUNTER — Other Ambulatory Visit: Payer: Self-pay

## 2020-04-15 ENCOUNTER — Ambulatory Visit: Payer: PPO | Admitting: Occupational Therapy

## 2020-04-15 DIAGNOSIS — R278 Other lack of coordination: Secondary | ICD-10-CM

## 2020-04-15 DIAGNOSIS — R414 Neurologic neglect syndrome: Secondary | ICD-10-CM

## 2020-04-15 DIAGNOSIS — M6281 Muscle weakness (generalized): Secondary | ICD-10-CM | POA: Diagnosis not present

## 2020-04-17 NOTE — Therapy (Signed)
[place] MAIN [facility] SERVICES [address] [place], [place], [address] Phone: [phone]   Fax:  [phone]  Occupational Therapy Treatment  Patient Details  Name: James [name] MRN: [id number] Date of Birth: 10-10-1940 Referring Provider (OT): Joselyn Arrow   Encounter Date: 04/15/2020   OT End of Session - 04/17/20 1843    Visit Number 45    Number of Visits 72    Date for OT Re-Evaluation 06/29/20    OT Start Time 1100    OT Stop Time 1146    OT Time Calculation (min) 46 min    Activity Tolerance Patient tolerated treatment well    Behavior During Therapy [facility] for tasks assessed/performed           Past Medical History:  Diagnosis Date  . Alcohol abuse, in remission   . Atrial fibrillation ([place])   . Bilateral renal masses   . Closed right ankle fracture   . 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 Gait disorder   . Gout   . Hypertension   . Seizure disorder ([place])   . 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     Past Surgical History:  Procedure Laterality Date  . CRANIECTOMY FOR DEPRESSED SKULL FRACTURE  2009   with MRSA infection  . CRANIOTOMY     X 2  . HERNIA REPAIR     during childhood  . ORIF ANKLE FRACTURE Left 2012    There were no vitals filed for this visit.   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  He suffered another fall in 2017.  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     Patient Stated Goals Patient indicates he wants to be stronger, use his right hand for daily tasks, be as independent as possible.    Currently in Pain? No/denies    Multiple Pain Sites No          Neuromuscular  Reeducation:  Medium playground size ball for toss/catch, bounce/catch and attempts at dribbling ball.   Tennis ball drills with hand to hand toss, toss up and catch, bounce/catch and toss back and forth to therapist, moderate difficulty and dropping the ball repeatedly.  Cues for technique.   Balloon toss from seated position with cues to use right hand for task.  Performed in standing with mat table behind him, once loss of balance during task where he needed to sit down quickly.   Patient seen for fine motor coordination skills with use of right hand to pick up small 1/2 sized nuts and bolts and screwing/unscrewing, cues for prehension patterns and increased time to complete.    Response to tx:   Patient's balance remains impaired and has difficulty with tasks that require a quick reaction time.  Requires min assist or performed with mat behind him for safety.  Cues for right hand use during tasks.  Continues to progress well and working towards goals in plan of care to increase independence in daily tasks.                          OT Long Term  Goals - 04/10/20 1641      OT LONG TERM GOAL #1   Title Patient will demonstrate use of utensil in right hand for self feeding with modified independence with little to no spillage.     Baseline unable at eval to hold utensil, 10th visit holding utensil, some feeding. 20th visit, feeding self consistently but still has some moderate spillage    Time 12    Period Weeks    Status Achieved      OT LONG TERM GOAL #2   Title Pt will increase hand strength in right by 5# to be able to cut food with modified independence.    Baseline unable to cut meat at evaluation, 10th visit starting to work on cutting meat, patient reports difficulty with cutting meat    Time 12    Period Weeks    Status Achieved      OT LONG TERM GOAL #3   Title Patient will demonstrate increased awareness and strategies to attend to right side to hold items  without dropping items.    Baseline difficulty with detecting items in right hand at eval, forgets items are in hand, 10th visit improved but still working on 12-30-2019: minimal dropping of items this date.  20th visit continued dropping of items occasionally but with increased awareness    Time 12    Period Weeks    Status On-going    Target Date 06/29/20      OT LONG TERM GOAL #4   Title Patient will demonstrate independence in home exercise program for ROM, strength and coordination of right hand.    Baseline eval-patient has not been performing exercises from last episode of care and demonstrates decline, 10th visit doing exercises at home and continue to modify program. 20th visit pt continues to engage in HEP with upgrades frequently    Time 12    Period Weeks    Status Partially Met    Target Date 06/29/20      OT LONG TERM GOAL #5   Title Pt to demonstrate improved balance with transitional movements with sit to stand from all surfaces with no loss of balance to reduce fall risk.    Time 12    Period Weeks    Status New    Target Date 06/29/20      OT LONG TERM GOAL #6   Title Patient will demonstrate pouring a drink with the right hand with attention to right side and not knocking the cup over and spilling.      Baseline unable at eval, 10th visit starting to use right hand more and attempting to pour with right.  20th visit still has difficulty at times.    Time 12    Period Weeks    Status On-going    Target Date 06/29/20                 Plan - 04/17/20 1843    Clinical Impression Statement Patient's balance remains impaired and has difficulty with tasks that require a quick reaction time.  Requires min assist or performed with mat behind him for safety.  Cues for right hand use during tasks.  Continues to progress well and working towards goals in plan of care to increase independence in daily tasks.    OT Occupational Profile and History Detailed Assessment-  Review of Records and additional review of physical, cognitive, psychosocial history related to current functional performance    Occupational Profile and client history currently  impacting functional performance repeated falls, TBI, expressive aphasia, limited motion in right hand, lack of coordination from previous CVA    Occupational performance deficits (Please refer to evaluation for details): ADL's;IADL's;Leisure    Body Structure / Function / Physical Skills ADL;Dexterity;Flexibility;ROM;Strength;Vision;Balance;Coordination;FMC;IADL;Tone;Endurance;UE functional use;Mobility    Cognitive Skills Attention;Safety Awareness;Problem Solve    Psychosocial Skills Environmental  Adaptations;Routines and Behaviors;Habits;Interpersonal Interaction    Rehab Potential Good    Clinical Decision Making Several treatment options, min-mod task modification necessary    Comorbidities Affecting Occupational Performance: May have comorbidities impacting occupational performance    Modification or Assistance to Complete Evaluation  Min-Moderate modification of tasks or assist with assess necessary to complete eval    OT Frequency 2x / week    OT Duration 12 weeks    OT Treatment/Interventions Self-care/ADL training;Visual/perceptual remediation/compensation;DME and/or AE instruction;Patient/family education;Moist Heat;Therapeutic exercise;Manual Therapy;Therapeutic activities;Neuromuscular education    Consulted and Agree with Plan of Care Patient    Family Member Consulted wife, [name]           Patient will benefit from skilled therapeutic intervention in order to improve the following deficits and impairments:   Body Structure / Function / Physical Skills: ADL, Dexterity, Flexibility, ROM, Strength, Vision, Balance, Coordination, FMC, IADL, Tone, Endurance, UE functional use, Mobility Cognitive Skills: Attention, Safety Awareness, Problem Solve Psychosocial Skills: Environmental  Adaptations, Routines  and Behaviors, Habits, Interpersonal Interaction   Visit Diagnosis: Muscle weakness (generalized)  Other lack of coordination  Neurologic neglect syndrome    Problem List Patient Active Problem List   Diagnosis Date Noted  . Sleep disturbance   . PAF (paroxysmal atrial fibrillation) ([place])   . Recurrent UTI   . Hypokalemia   . Hyponatremia   . Abnormal urine   . 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 Benign essential HTN   . Urinary retention   . History of gout   . Global aphasia   . SAH (subarachnoid hemorrhage) ([place])   . SDH (subdural hematoma) ([place])   . Lymphocytosis   . Subdural hemorrhage following injury [facility]) 05/12/2016   Miyoko Hashimi T Tramain Gershman, OTR/L, CLT  Willson Lipa 04/17/2020, 6:52 PM  [place] MAIN [facility] SERVICES [address] [place], [place], [address] Phone: [phone]   Fax:  [phone]  Name: James [name] MRN: [id number] Date of Birth: Jul 30, 1940

## 2020-04-20 ENCOUNTER — Other Ambulatory Visit: Payer: Self-pay

## 2020-04-20 ENCOUNTER — Ambulatory Visit: Payer: PPO | Admitting: Occupational Therapy

## 2020-04-20 DIAGNOSIS — M6281 Muscle weakness (generalized): Secondary | ICD-10-CM

## 2020-04-20 DIAGNOSIS — R414 Neurologic neglect syndrome: Secondary | ICD-10-CM

## 2020-04-20 DIAGNOSIS — R278 Other lack of coordination: Secondary | ICD-10-CM

## 2020-04-22 ENCOUNTER — Encounter: Payer: Self-pay | Admitting: Occupational Therapy

## 2020-04-22 ENCOUNTER — Other Ambulatory Visit: Payer: Self-pay

## 2020-04-22 ENCOUNTER — Ambulatory Visit: Payer: PPO | Admitting: Occupational Therapy

## 2020-04-22 DIAGNOSIS — R414 Neurologic neglect syndrome: Secondary | ICD-10-CM

## 2020-04-22 DIAGNOSIS — R278 Other lack of coordination: Secondary | ICD-10-CM

## 2020-04-22 DIAGNOSIS — M6281 Muscle weakness (generalized): Secondary | ICD-10-CM | POA: Diagnosis not present

## 2020-04-22 DIAGNOSIS — R2681 Unsteadiness on feet: Secondary | ICD-10-CM

## 2020-04-22 NOTE — Therapy (Signed)
[place] MAIN [facility] SERVICES [address] [place], [place], [address] Phone: [phone]   Fax:  [phone]  Occupational Therapy Treatment  Patient Details  Name: James [name] MRN: [id number] Date of Birth: 06/21/1941 Referring Provider (OT): Joselyn Arrow   Encounter Date: 04/20/2020   OT End of Session - 04/22/20 2118    Visit Number 46    Number of Visits 72    Date for OT Re-Evaluation 06/29/20    OT Start Time 1101    OT Stop Time 1148    OT Time Calculation (min) 47 min    Activity Tolerance Patient tolerated treatment well    Behavior During Therapy [facility] for tasks assessed/performed           Past Medical History:  Diagnosis Date  . Alcohol abuse, in remission   . Atrial fibrillation ([place])   . Bilateral renal masses   . Closed right ankle fracture   . 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 Gait disorder   . Gout   . Hypertension   . Seizure disorder ([place])   . 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     Past Surgical History:  Procedure Laterality Date  . CRANIECTOMY FOR DEPRESSED SKULL FRACTURE  2009   with MRSA infection  . CRANIOTOMY     X 2  . HERNIA REPAIR     during childhood  . ORIF ANKLE FRACTURE Left 2012    There were no vitals filed for this visit.   Subjective Assessment - 04/22/20 2115    Subjective  Wife thought patient wanted to talk to therapist again about frequency of therapy sessions however patient indicating he is fine with continuing 2 times a week for a couple weeks and then reassess.    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  He suffered another fall in 2017.  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     Patient Stated Goals Patient indicates he wants to be stronger, use his right hand for  daily tasks, be as independent as possible.    Currently in Pain? No/denies    Multiple Pain Sites No            Patient seen this date for tasks in sitting and standing for balance tasks with use of medium ball for bounce/catch, toss/catch with therapist, multiple repetitions and sets completed with rest breaks as needed.  Patient requires min guard to min assist for balance and some tasks performed in front of mat so patient can sit if needed.    Manipulation skills with use of glass beads to pick up from flat surface, some placed flat side down or rounded side down.  More difficulty with flat side in down position on flat surface.  Patient requires cues to use right hand, prehension pattern and only able to pick up one item at a time, unable to move to palm and maintain in hand with attempts to use hand for storage.    Response to tx:   Patient agreeable to therapy today and indicates he is ok with continuing for the next couple weeks to work on balance and right hand function.  Patient continues to demonstrate balance deficits that places him at a fall risk, he often is impulsive  with his movements and has difficulty with modulating his speed.  This is evident when he drops items and pursues picking up items from the floor.  If therapist provides cues to wait, or that therapist will retrieve item, patient listens and is able to stop movement and redirect to task.  Difficult with manipulation skills with using hand for storage on the right, tends to drop items out the side of his hand and often doesn't realize unless he hears item drop to the floor.  Will continue to work towards goals to improve independence in daily tasks.                      OT Education - 04/22/20 2117    Education provided Yes    Education Details balance, [facility]    Person(s) Educated Patient;Spouse    Methods Explanation;Demonstration;Verbal cues    Comprehension Verbalized understanding;Returned  demonstration;Verbal cues required               OT Long Term Goals - 04/10/20 1641      OT LONG TERM GOAL #1   Title Patient will demonstrate use of utensil in right hand for self feeding with modified independence with little to no spillage.     Baseline unable at eval to hold utensil, 10th visit holding utensil, some feeding. 20th visit, feeding self consistently but still has some moderate spillage    Time 12    Period Weeks    Status Achieved      OT LONG TERM GOAL #2   Title Pt will increase hand strength in right by 5# to be able to cut food with modified independence.    Baseline unable to cut meat at evaluation, 10th visit starting to work on cutting meat, patient reports difficulty with cutting meat    Time 12    Period Weeks    Status Achieved      OT LONG TERM GOAL #3   Title Patient will demonstrate increased awareness and strategies to attend to right side to hold items without dropping items.    Baseline difficulty with detecting items in right hand at eval, forgets items are in hand, 10th visit improved but still working on 12-30-2019: minimal dropping of items this date.  20th visit continued dropping of items occasionally but with increased awareness    Time 12    Period Weeks    Status On-going    Target Date 06/29/20      OT LONG TERM GOAL #4   Title Patient will demonstrate independence in home exercise program for ROM, strength and coordination of right hand.    Baseline eval-patient has not been performing exercises from last episode of care and demonstrates decline, 10th visit doing exercises at home and continue to modify program. 20th visit pt continues to engage in HEP with upgrades frequently    Time 12    Period Weeks    Status Partially Met    Target Date 06/29/20      OT LONG TERM GOAL #5   Title Pt to demonstrate improved balance with transitional movements with sit to stand from all surfaces with no loss of balance to reduce fall risk.    Time  12    Period Weeks    Status New    Target Date 06/29/20      OT LONG TERM GOAL #6   Title Patient will demonstrate pouring a drink with the right hand with attention to right  side and not knocking the cup over and spilling.      Baseline unable at eval, 10th visit starting to use right hand more and attempting to pour with right.  20th visit still has difficulty at times.    Time 12    Period Weeks    Status On-going    Target Date 06/29/20                 Plan - 04/22/20 2119    Clinical Impression Statement Patient agreeable to therapy today and indicates he is ok with continuing for the next couple weeks to work on balance and right hand function.  Patient continues to demonstrate balance deficits that places him at a fall risk, he often is impulsive with his movements and has difficulty with modulating his speed.  This is evident when he drops items and pursues picking up items from the floor.  If therapist provides cues to wait, or that therapist will retrieve item, patient listens and is able to stop movement and redirect to task.  Difficult with manipulation skills with using hand for storage on the right, tends to drop items out the side of his hand and often doesn't realize unless he hears item drop to the floor.  Will continue to work towards goals to improve independence in daily tasks.    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    Occupational performance deficits (Please refer to evaluation for details): ADL's;IADL's;Leisure    Body Structure / Function / Physical Skills  ADL;Dexterity;Flexibility;ROM;Strength;Vision;Balance;Coordination;FMC;IADL;Tone;Endurance;UE functional use;Mobility    Cognitive Skills Attention;Safety Awareness;Problem Solve    Psychosocial Skills Environmental  Adaptations;Routines and Behaviors;Habits;Interpersonal Interaction    Rehab Potential Good    Clinical Decision Making Several treatment options, min-mod task modification necessary    Comorbidities Affecting Occupational Performance: May have comorbidities impacting occupational performance    Modification or Assistance to Complete Evaluation  Min-Moderate modification of tasks or assist with assess necessary to complete eval    OT Frequency 2x / week    OT Duration 12 weeks    OT Treatment/Interventions Self-care/ADL training;Visual/perceptual remediation/compensation;DME and/or AE instruction;Patient/family education;Moist Heat;Therapeutic exercise;Manual Therapy;Therapeutic activities;Neuromuscular education    Consulted and Agree with Plan of Care Patient    Family Member Consulted wife, Gigi Gin           Patient will benefit from skilled therapeutic intervention in order to improve the following deficits and impairments:   Body Structure / Function / Physical Skills: ADL, Dexterity, Flexibility, ROM, Strength, Vision, Balance, Coordination, FMC, IADL, Tone, Endurance, UE functional use, Mobility Cognitive Skills: Attention, Safety Awareness, Problem Solve Psychosocial Skills: Environmental  Adaptations, Routines and Behaviors, Habits, Interpersonal Interaction   Visit Diagnosis: Muscle weakness (generalized)  Other lack of coordination  Neurologic neglect syndrome    Problem List Patient Active Problem List   Diagnosis Date Noted  . Sleep disturbance   . PAF (paroxysmal atrial fibrillation) ([facility])   . Recurrent UTI   . Hypokalemia   . Hyponatremia   . Abnormal urine   . 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 Benign essential HTN   . Urinary  retention   . History of gout   . Global aphasia   .  SAH (subarachnoid hemorrhage) ([place])   . SDH (subdural hematoma) ([place])   . Lymphocytosis   . Subdural hemorrhage following injury [facility]) 05/12/2016   Maram Bently T Rashonda Warrior, OTR/L, CLT   Damauri Minion 04/22/2020, 10:03 PM  [place] MAIN [facility] SERVICES [address] [place], [place], [address] Phone: [phone]   Fax:  [phone]  Name: James [name] MRN: [id number] Date of Birth: [birth year]

## 2020-04-22 NOTE — Therapy (Addendum)
[place] MAIN [facility] SERVICES [address] [place], [place], [address] Phone: [phone]   Fax:  [phone]  Occupational Therapy Treatment  Patient Details  Name: James [name] MRN: [id number] Date of Birth: 1941-02-20 Referring Provider (OT): Joselyn Arrow   Encounter Date: 04/22/2020   OT End of Session - 04/22/20 2230    Visit Number 47    Number of Visits 72    Date for OT Re-Evaluation 06/29/20    OT Start Time 1100    OT Stop Time 1150    OT Time Calculation (min) 50 min    Activity Tolerance Patient tolerated treatment well    Behavior During Therapy [facility] for tasks assessed/performed           Past Medical History:  Diagnosis Date  . Alcohol abuse, in remission   . Atrial fibrillation ([place])   . Bilateral renal masses   . Closed right ankle fracture   . 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 Gait disorder   . Gout   . Hypertension   . Seizure disorder ([place])   . 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     Past Surgical History:  Procedure Laterality Date  . CRANIECTOMY FOR DEPRESSED SKULL FRACTURE  2009   with MRSA infection  . CRANIOTOMY     X 2  . HERNIA REPAIR     during childhood  . ORIF ANKLE FRACTURE Left 2012    There were no vitals filed for this visit.   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  He suffered another fall in 2017.  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     Patient Stated Goals Patient indicates he wants to be stronger, use his right hand for daily tasks, be as independent as possible.    Currently in Pain? No/denies    Multiple Pain Sites No           No pain today  Patient seen this date for grip strengthening with use of digital hand gripper  for multiple trials, recorded 5 measurements as follows, ,lowest to highest measured in pounds of pressure:  26, 38, 40,41, 44#. Resistive reaching tasks in multi directional movements in sitting and standing.  Cues to attend to right hand while grasping and releasing objects.   ADL:  Patient seen for task of gathering supplies needed for laundry activity, min guard around gym area and cues for items required.  Participating in folding laundry from a standing position, using the tabletop as needed to place items, spread out and use for folding.  Cues for 2 types of folding methods for towels, patient able to perform with therapist demo and cues for technique.   Neuromuscular Reeducation:   Patient seen for use of 100 peg judy board to place and remove pegs from bucket to grid, difficulty with flipping items from one end to the other with decreased manipulation skills of right hand.  Patient able to grasp and release items but demonstrates difficulty to move or change position of object in his hand.    Response to tx:   Patient continues to perform well, remains a fall risk for task especially for things that tend to be reactive, such as  when patient drops items and moves quickly to retrieve object.  Patient able to perform laundry tasks in standing this date and also gather items needed with min guard and no loss of balance.  Continues to demonstrate decreased coordination skills/manipulation skills with items in right hand.  Consistent with grasp and release but occasionally requires cues for attention to right hand for release.                          OT Education - 04/22/20 2230    Education provided Yes    Education Details balance, [facility]    Person(s) Educated Patient;Spouse    Methods Explanation;Demonstration;Verbal cues    Comprehension Verbalized understanding;Returned demonstration;Verbal cues required               OT Long Term Goals - 04/10/20 1641      OT LONG  TERM GOAL #1   Title Patient will demonstrate use of utensil in right hand for self feeding with modified independence with little to no spillage.     Baseline unable at eval to hold utensil, 10th visit holding utensil, some feeding. 20th visit, feeding self consistently but still has some moderate spillage    Time 12    Period Weeks    Status Achieved      OT LONG TERM GOAL #2   Title Pt will increase hand strength in right by 5# to be able to cut food with modified independence.    Baseline unable to cut meat at evaluation, 10th visit starting to work on cutting meat, patient reports difficulty with cutting meat    Time 12    Period Weeks    Status Achieved      OT LONG TERM GOAL #3   Title Patient will demonstrate increased awareness and strategies to attend to right side to hold items without dropping items.    Baseline difficulty with detecting items in right hand at eval, forgets items are in hand, 10th visit improved but still working on 12-30-2019: minimal dropping of items this date.  20th visit continued dropping of items occasionally but with increased awareness    Time 12    Period Weeks    Status On-going    Target Date 06/29/20      OT LONG TERM GOAL #4   Title Patient will demonstrate independence in home exercise program for ROM, strength and coordination of right hand.    Baseline eval-patient has not been performing exercises from last episode of care and demonstrates decline, 10th visit doing exercises at home and continue to modify program. 20th visit pt continues to engage in HEP with upgrades frequently    Time 12    Period Weeks    Status Partially Met    Target Date 06/29/20      OT LONG TERM GOAL #5   Title Pt to demonstrate improved balance with transitional movements with sit to stand from all surfaces with no loss of balance to reduce fall risk.    Time 12    Period Weeks    Status New    Target Date 06/29/20      OT LONG TERM GOAL #6   Title Patient  will demonstrate pouring a drink with the right hand with attention to right side and not knocking the cup over and spilling.      Baseline unable at eval, 10th visit starting to use right hand more and attempting to pour with  right.  20th visit still has difficulty at times.    Time 12    Period Weeks    Status On-going    Target Date 06/29/20               Clinical Impression statement: Patient continues to perform well, remains a fall risk for task especially for things that tend to be reactive, such as when patient drops items and moves quickly to retrieve object.  Patient able to perform laundry tasks in standing this date and also gather items needed with min guard and no loss of balance.  Continues to demonstrate decreased coordination skills/manipulation skills with items in right hand.  Consistent with grasp and release but occasionally requires cues for attention to right hand for release.    Plan - 04/22/20 2231    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    Occupational performance deficits (Please refer to evaluation for details): ADL's;IADL's;Leisure    Body Structure / Function / Physical Skills ADL;Dexterity;Flexibility;ROM;Strength;Vision;Balance;Coordination;FMC;IADL;Tone;Endurance;UE functional use;Mobility    Cognitive Skills Attention;Safety Awareness;Problem Solve    Psychosocial Skills Environmental  Adaptations;Routines and Behaviors;Habits;Interpersonal Interaction    Rehab Potential Good    Clinical Decision Making Several treatment options, min-mod task modification necessary    Comorbidities Affecting Occupational Performance: May  have comorbidities impacting occupational performance    Modification or Assistance to Complete Evaluation  Min-Moderate modification of tasks or assist with assess necessary to complete eval    OT Frequency 2x / week    OT Duration 12 weeks    OT Treatment/Interventions Self-care/ADL training;Visual/perceptual remediation/compensation;DME and/or AE instruction;Patient/family education;Moist Heat;Therapeutic exercise;Manual Therapy;Therapeutic activities;Neuromuscular education    Consulted and Agree with Plan of Care Patient    Family Member Consulted wife, [name]           Patient will benefit from skilled therapeutic intervention in order to improve the following deficits and impairments:   Body Structure / Function / Physical Skills: ADL, Dexterity, Flexibility, ROM, Strength, Vision, Balance, Coordination, FMC, IADL, Tone, Endurance, UE functional use, Mobility Cognitive Skills: Attention, Safety Awareness, Problem Solve Psychosocial Skills: Environmental  Adaptations, Routines and Behaviors, Habits, Interpersonal Interaction   Visit Diagnosis: Unsteadiness on feet  Muscle weakness (generalized)  Other lack of coordination  Neurologic neglect syndrome    Problem List Patient Active Problem List   Diagnosis Date Noted  . Sleep disturbance   . PAF (paroxysmal atrial fibrillation) ([place])   . Recurrent UTI   . Hypokalemia   . Hyponatremia   . Abnormal urine   . 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 Benign essential HTN   . Urinary retention   . History of gout   . Global aphasia   . SAH (subarachnoid hemorrhage) ([place])   . SDH (subdural hematoma) ([place])   . Lymphocytosis   . Subdural hemorrhage following injury [facility]) 05/12/2016   Minnie Legros T Bryan Goin, OTR/L, CLT  Brette Cast 04/22/2020, 10:31 PM  [place] MAIN [facility] SERVICES [address] [place], [place], [address] Phone: [phone]   Fax:  [phone]  Name:  James [name] MRN: [id number] Date of Birth: February 25, 1941

## 2020-04-27 ENCOUNTER — Ambulatory Visit: Payer: PPO | Attending: Neurology | Admitting: Occupational Therapy

## 2020-04-27 ENCOUNTER — Other Ambulatory Visit: Payer: Self-pay

## 2020-04-27 DIAGNOSIS — R414 Neurologic neglect syndrome: Secondary | ICD-10-CM | POA: Insufficient documentation

## 2020-04-27 DIAGNOSIS — M6281 Muscle weakness (generalized): Secondary | ICD-10-CM | POA: Diagnosis not present

## 2020-04-27 DIAGNOSIS — R278 Other lack of coordination: Secondary | ICD-10-CM | POA: Insufficient documentation

## 2020-04-27 DIAGNOSIS — R2681 Unsteadiness on feet: Secondary | ICD-10-CM | POA: Diagnosis not present

## 2020-04-28 ENCOUNTER — Encounter: Payer: Self-pay | Admitting: Occupational Therapy

## 2020-04-28 NOTE — Therapy (Signed)
[place] MAIN [facility] SERVICES [address] [place], [place], [address] Phone: [phone]   Fax:  [phone]  Occupational Therapy Treatment  Patient Details  Name: James [name] MRN: [id number] Date of Birth: 11-13-1940 Referring Provider (OT): Joselyn Arrow   Encounter Date: 04/27/2020   OT End of Session - 04/28/20 1652    Visit Number 48    Number of Visits 72    Date for OT Re-Evaluation 06/29/20    OT Start Time 1101    OT Stop Time 1145    OT Time Calculation (min) 44 min    Activity Tolerance Patient tolerated treatment well    Behavior During Therapy [facility] for tasks assessed/performed           Past Medical History:  Diagnosis Date  . Alcohol abuse, in remission   . Atrial fibrillation ([place])   . Bilateral renal masses   . Closed right ankle fracture   . 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 Gait disorder   . Gout   . Hypertension   . Seizure disorder ([place])   . 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     Past Surgical History:  Procedure Laterality Date  . CRANIECTOMY FOR DEPRESSED SKULL FRACTURE  2009   with MRSA infection  . CRANIOTOMY     X 2  . HERNIA REPAIR     during childhood  . ORIF ANKLE FRACTURE Left 2012    There were no vitals filed for this visit.   Subjective Assessment - 04/28/20 1652    Subjective  Patient indicates he is excited about upcoming vacation at the beach but will still focus on exercises while he is gone.    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  He suffered another fall in 2017.  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     Patient Stated Goals Patient indicates he wants to be stronger, use his right hand for daily tasks, be as independent as possible.    Currently in Pain? No/denies     Multiple Pain Sites No           Patient seen for strengthening and ROM of RUE with use of upper body ergometer for 8 mins, forwards, backwards, alternating levels of resistance from 2.5 to 3.0, performed from a seated position.  Therapist in constant attendance to ensure grip and to adjust settings.  Patient unable to maintain right grip at times and requires assist. Balloon volley from seated and standing position with min guard to min assist.  Patient requires min assist when performing quick reactive movement patterns.   Neuromuscular reeducation:   Patient seen for use of Tool bench with hex and allan head bolt and nuts, larger sized in diameter, 2 rows completed.  Cues for grip on tools (screwdriver this date) and cues for using right hand, still tends to initiate task with left unaffected, nondominant hand.  Right hand use on top of the tool bench while left hand underneath to manage nuts.  Patient demos difficulty with right hand under bench with vision occluded due to decreased sensation and attention to right.    Response to tx:   Patient continues to demonstrate decreased safety and balance with reactionary movements or any quick movements in response  to interacting with items in his environment such as dropping an item, catching an item.  Patient continues to tend to use left hand with tasks especially if the task requires increased focus such as with tool use.  Will continue to work towards improving attention to right, initiation of right hand during task and improved coordination to manipulate objects.                      OT Education - 04/28/20 1652    Education provided Yes    Education Details balance, [facility]    Person(s) Educated Patient;Spouse    Methods Explanation;Demonstration;Verbal cues    Comprehension Verbalized understanding;Returned demonstration;Verbal cues required               OT Long Term Goals - 04/28/20 1141      OT LONG TERM GOAL  #1   Title Patient will demonstrate use of utensil in right hand for self feeding with modified independence with little to no spillage.     Baseline unable at eval to hold utensil, 10th visit holding utensil, some feeding. 20th visit, feeding self consistently but still has some moderate spillage    Time 12    Period Weeks    Status Achieved      OT LONG TERM GOAL #2   Title Pt will increase hand strength in right by 5# to be able to cut food with modified independence.    Baseline unable to cut meat at evaluation, 10th visit starting to work on cutting meat, patient reports difficulty with cutting meat    Time 12    Period Weeks    Status Achieved      OT LONG TERM GOAL #3   Title Patient will demonstrate increased awareness and strategies to attend to right side to hold items without dropping items.    Baseline difficulty with detecting items in right hand at eval, forgets items are in hand, 10th visit improved but still working on 12-30-2019: minimal dropping of items this date.  20th visit continued dropping of items occasionally but with increased awareness.    Time 12    Period Weeks    Status On-going    Target Date 06/29/20      OT LONG TERM GOAL #4   Title Patient will demonstrate independence in home exercise program for ROM, strength and coordination of right hand.    Baseline eval-patient has not been performing exercises from last episode of care and demonstrates decline, 10th visit doing exercises at home and continue to modify program. 20th visit pt continues to engage in HEP with upgrades frequently    Time 12    Period Weeks    Status Partially Met      OT LONG TERM GOAL #5   Title Pt to demonstrate improved balance with transitional movements with sit to stand from all surfaces with no loss of balance to reduce fall risk.    Time 12    Period Weeks    Status New      OT LONG TERM GOAL #6   Title Patient will demonstrate pouring a drink with the right hand with  attention to right side and not knocking the cup over and spilling.      Baseline unable at eval, 10th visit starting to use right hand more and attempting to pour with right.  20th visit still has difficulty at times.    Time 12    Period Weeks  Status On-going                 Plan - 04/28/20 1652    Clinical Impression Statement Patient continues to demonstrate decreased safety and balance with reactionary movements or any quick movements in response to interacting with items in his environment such as dropping an item, catching an item.  Patient continues to tend to use left hand with tasks especially if the task requires increased focus such as with tool use.  Will continue to work towards improving attention to right, initiation of right hand during task and improved coordination to manipulate objects.    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    Occupational performance deficits (Please refer to evaluation for details): ADL's;IADL's;Leisure    Body Structure / Function / Physical Skills ADL;Dexterity;Flexibility;ROM;Strength;Vision;Balance;Coordination;FMC;IADL;Tone;Endurance;UE functional use;Mobility    Cognitive Skills Attention;Safety Awareness;Problem Solve    Psychosocial Skills Environmental  Adaptations;Routines and Behaviors;Habits;Interpersonal Interaction    Rehab Potential Good    Clinical Decision Making Several treatment options, min-mod task modification necessary    Comorbidities Affecting Occupational Performance: May have comorbidities impacting occupational performance    Modification or Assistance to Complete Evaluation   Min-Moderate modification of tasks or assist with assess necessary to complete eval    OT Frequency 2x / week    OT Duration 12 weeks    OT Treatment/Interventions Self-care/ADL training;Visual/perceptual remediation/compensation;DME and/or AE instruction;Patient/family education;Moist Heat;Therapeutic exercise;Manual Therapy;Therapeutic activities;Neuromuscular education    Consulted and Agree with Plan of Care Patient    Family Member Consulted wife, [name]           Patient will benefit from skilled therapeutic intervention in order to improve the following deficits and impairments:   Body Structure / Function / Physical Skills: ADL, Dexterity, Flexibility, ROM, Strength, Vision, Balance, Coordination, FMC, IADL, Tone, Endurance, UE functional use, Mobility Cognitive Skills: Attention, Safety Awareness, Problem Solve Psychosocial Skills: Environmental  Adaptations, Routines and Behaviors, Habits, Interpersonal Interaction   Visit Diagnosis: Muscle weakness (generalized)  Other lack of coordination  Neurologic neglect syndrome  Unsteadiness on feet    Problem List Patient Active Problem List   Diagnosis Date Noted  . Sleep disturbance   . PAF (paroxysmal atrial fibrillation) ([place])   . Recurrent UTI   . Hypokalemia   . Hyponatremia   . Abnormal urine   . 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 Benign essential HTN   . Urinary retention   . History of gout   . Global aphasia   . SAH (subarachnoid hemorrhage) ([place])   . SDH (subdural hematoma) ([place])   . Lymphocytosis   . Subdural hemorrhage following injury [facility]) 05/12/2016   Raesha Coonrod T Deaisha Welborn, OTR/L, CLT  Blanton Kardell 04/29/2020, 6:40 PM  [place] MAIN [facility] SERVICES [address] [place], [place], [address] Phone: [phone]   Fax:  [phone]  Name: Kristoph Sattler MRN: [id number] Date of Birth: 08-08-1940

## 2020-04-29 ENCOUNTER — Encounter: Payer: Self-pay | Admitting: Occupational Therapy

## 2020-04-29 ENCOUNTER — Other Ambulatory Visit: Payer: Self-pay

## 2020-04-29 ENCOUNTER — Ambulatory Visit: Payer: PPO | Admitting: Occupational Therapy

## 2020-04-29 DIAGNOSIS — M6281 Muscle weakness (generalized): Secondary | ICD-10-CM | POA: Diagnosis not present

## 2020-04-29 DIAGNOSIS — R278 Other lack of coordination: Secondary | ICD-10-CM

## 2020-04-29 DIAGNOSIS — R414 Neurologic neglect syndrome: Secondary | ICD-10-CM

## 2020-04-29 NOTE — Therapy (Signed)
[place] MAIN [facility] SERVICES [address] [place], [place], [address] Phone: [phone]   Fax:  [phone]  Occupational Therapy Treatment  Patient Details  Name: James [name] MRN: [id number] Date of Birth: May 17, 1941 Referring Provider (OT): Joselyn Arrow   Encounter Date: 04/29/2020   OT End of Session - 04/29/20 1905    Visit Number 49    Number of Visits 72    Date for OT Re-Evaluation 06/29/20    OT Start Time 1100    OT Stop Time 1146    OT Time Calculation (min) 46 min    Activity Tolerance Patient tolerated treatment well    Behavior During Therapy [facility] for tasks assessed/performed           Past Medical History:  Diagnosis Date  . Alcohol abuse, in remission   . Atrial fibrillation ([place])   . Bilateral renal masses   . Closed right ankle fracture   . 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 Gait disorder   . Gout   . Hypertension   . Seizure disorder ([place])   . 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     Past Surgical History:  Procedure Laterality Date  . CRANIECTOMY FOR DEPRESSED SKULL FRACTURE  2009   with MRSA infection  . CRANIOTOMY     X 2  . HERNIA REPAIR     during childhood  . ORIF ANKLE FRACTURE Left 2012    There were no vitals filed for this visit.   Subjective Assessment - 04/29/20 1903    Subjective  Patient ready for a vacation and will be going to the beach next week with his wife.  Ready for a break from therapy for a short bit.    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  He suffered another fall in 2017.  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     Patient Stated Goals Patient indicates he wants to be stronger, use his right hand for daily tasks, be as independent as possible.    Currently in  Pain? No/denies    Multiple Pain Sites No            Patient seen this date for finger strengthening and coordination with use of resistive pinch pins to place and remove from yardstick placed in vertical plane of motion, use of right hand, encouraged to place items towards the top with reach.  Patient able to perform all levels of pinch from least to most resistive with cues for pinch patterns.  Occasional difficulty with modulation of pinch and control of movement.    Patient performing completion of tetris style puzzle, different sized pieces, shapes and colors, placing into grid.  Focused on manipulation of pieces, ability to turn or flip objects as needed to fit into spaces following a design map.  Patient requires cues for prehension and use of index in isolation to move items around in grid.  Patient able to complete puzzle design in its entirety during session with cues and occasional assist.    Response to tx: Patient continues to require cues to use right hand for tasks in clinic, he placed his left hand under his leg this date to avoid using it with completion of puzzle.  Patient requires cues  at times for turning or flipping objects for correct perceptual positioning into grid.  Some difficulty with modulation of pinch patterns today with varying resistance of pinch pins.  Patient will be on vacation next week and will take a short break from therapy and will be reassessed upon return to determine needs and if frequency of sessions needs to be adjusted.                      OT Education - 04/29/20 1904    Education provided Yes    Education Details HEP, use of right hand in daily tasks, safety with transfers, safety when dropping items and attempting to retrieve.    Person(s) Educated Patient;Spouse    Methods Explanation;Demonstration;Verbal cues    Comprehension Verbalized understanding;Returned demonstration;Verbal cues required               OT Long Term  Goals - 04/28/20 1141      OT LONG TERM GOAL #1   Title Patient will demonstrate use of utensil in right hand for self feeding with modified independence with little to no spillage.     Baseline unable at eval to hold utensil, 10th visit holding utensil, some feeding. 20th visit, feeding self consistently but still has some moderate spillage    Time 12    Period Weeks    Status Achieved      OT LONG TERM GOAL #2   Title Pt will increase hand strength in right by 5# to be able to cut food with modified independence.    Baseline unable to cut meat at evaluation, 10th visit starting to work on cutting meat, patient reports difficulty with cutting meat    Time 12    Period Weeks    Status Achieved      OT LONG TERM GOAL #3   Title Patient will demonstrate increased awareness and strategies to attend to right side to hold items without dropping items.    Baseline difficulty with detecting items in right hand at eval, forgets items are in hand, 10th visit improved but still working on 12-30-2019: minimal dropping of items this date.  20th visit continued dropping of items occasionally but with increased awareness.    Time 12    Period Weeks    Status On-going    Target Date 06/29/20      OT LONG TERM GOAL #4   Title Patient will demonstrate independence in home exercise program for ROM, strength and coordination of right hand.    Baseline eval-patient has not been performing exercises from last episode of care and demonstrates decline, 10th visit doing exercises at home and continue to modify program. 20th visit pt continues to engage in HEP with upgrades frequently    Time 12    Period Weeks    Status Partially Met      OT LONG TERM GOAL #5   Title Pt to demonstrate improved balance with transitional movements with sit to stand from all surfaces with no loss of balance to reduce fall risk.    Time 12    Period Weeks    Status New      OT LONG TERM GOAL #6   Title Patient will  demonstrate pouring a drink with the right hand with attention to right side and not knocking the cup over and spilling.      Baseline unable at eval, 10th visit starting to use right hand more and attempting to pour with right.  20th visit still has difficulty at times.    Time 12    Period Weeks    Status On-going                 Plan - 04/29/20 1905    Clinical Impression Statement Patient continues to require cues to use right hand for tasks in clinic, he placed his left hand under his leg this date to avoid using it with completion of puzzle.  Patient requires cues at times for turning or flipping objects for correct perceptual positioning into grid.  Some difficulty with modulation of pinch patterns today with varying resistance of pinch pins.  Patient will be on vacation next week and will take a short break from therapy and will be reassessed upon return to determine needs and if frequency of sessions needs to be adjusted.    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    Occupational performance deficits (Please refer to evaluation for details): ADL's;IADL's;Leisure    Body Structure / Function / Physical Skills ADL;Dexterity;Flexibility;ROM;Strength;Vision;Balance;Coordination;FMC;IADL;Tone;Endurance;UE functional use;Mobility    Cognitive Skills Attention;Safety Awareness;Problem Solve    Psychosocial Skills Environmental  Adaptations;Routines and Behaviors;Habits;Interpersonal Interaction    Rehab Potential Good    Clinical Decision Making Several treatment options, min-mod task modification necessary    Comorbidities Affecting Occupational  Performance: May have comorbidities impacting occupational performance    Modification or Assistance to Complete Evaluation  Min-Moderate modification of tasks or assist with assess necessary to complete eval    OT Frequency 2x / week    OT Duration 12 weeks    OT Treatment/Interventions Self-care/ADL training;Visual/perceptual remediation/compensation;DME and/or AE instruction;Patient/family education;Moist Heat;Therapeutic exercise;Manual Therapy;Therapeutic activities;Neuromuscular education    Consulted and Agree with Plan of Care Patient    Family Member Consulted wife, [name]           Patient will benefit from skilled therapeutic intervention in order to improve the following deficits and impairments:   Body Structure / Function / Physical Skills: ADL, Dexterity, Flexibility, ROM, Strength, Vision, Balance, Coordination, FMC, IADL, Tone, Endurance, UE functional use, Mobility Cognitive Skills: Attention, Safety Awareness, Problem Solve Psychosocial Skills: Environmental  Adaptations, Routines and Behaviors, Habits, Interpersonal Interaction   Visit Diagnosis: Muscle weakness (generalized)  Other lack of coordination  Neurologic neglect syndrome    Problem List Patient Active Problem List   Diagnosis Date Noted  . Sleep disturbance   . PAF (paroxysmal atrial fibrillation) ([place])   . Recurrent UTI   . Hypokalemia   . Hyponatremia   . Abnormal urine   . 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 Benign essential HTN   . Urinary retention   . History of gout   . Global aphasia   . SAH (subarachnoid hemorrhage) ([place])   . SDH (subdural hematoma) ([place])   . Lymphocytosis   . Subdural hemorrhage following injury [facility]) 05/12/2016   Jayliah Benett T Emaley Applin, OTR/L, CLT  Sharyl Panchal 04/29/2020, 7:16 PM  [place] MAIN [facility] SERVICES [address] [place], [place], [address] Phone: [phone]   Fax:  [phone]  Name: James [name] MRN: [id number] Date of Birth: [birth year]

## 2020-05-04 ENCOUNTER — Ambulatory Visit: Payer: PPO | Admitting: Occupational Therapy

## 2020-05-06 ENCOUNTER — Ambulatory Visit: Payer: PPO | Admitting: Occupational Therapy

## 2020-05-11 ENCOUNTER — Ambulatory Visit: Payer: PPO | Admitting: Occupational Therapy

## 2020-05-13 ENCOUNTER — Ambulatory Visit: Payer: PPO | Admitting: Occupational Therapy

## 2020-05-18 ENCOUNTER — Other Ambulatory Visit: Payer: Self-pay

## 2020-05-18 ENCOUNTER — Ambulatory Visit: Payer: PPO | Admitting: Occupational Therapy

## 2020-05-18 DIAGNOSIS — M6281 Muscle weakness (generalized): Secondary | ICD-10-CM | POA: Diagnosis not present

## 2020-05-18 DIAGNOSIS — R278 Other lack of coordination: Secondary | ICD-10-CM

## 2020-05-18 DIAGNOSIS — R414 Neurologic neglect syndrome: Secondary | ICD-10-CM

## 2020-05-18 DIAGNOSIS — R2681 Unsteadiness on feet: Secondary | ICD-10-CM

## 2020-05-20 ENCOUNTER — Other Ambulatory Visit: Payer: Self-pay

## 2020-05-20 ENCOUNTER — Ambulatory Visit: Payer: PPO | Admitting: Occupational Therapy

## 2020-05-20 DIAGNOSIS — R2681 Unsteadiness on feet: Secondary | ICD-10-CM

## 2020-05-20 DIAGNOSIS — M6281 Muscle weakness (generalized): Secondary | ICD-10-CM | POA: Diagnosis not present

## 2020-05-20 DIAGNOSIS — R414 Neurologic neglect syndrome: Secondary | ICD-10-CM

## 2020-05-20 DIAGNOSIS — R278 Other lack of coordination: Secondary | ICD-10-CM

## 2020-05-24 ENCOUNTER — Encounter: Payer: Self-pay | Admitting: Occupational Therapy

## 2020-05-24 NOTE — Therapy (Signed)
[place] MAIN [facility] SERVICES [address] [place], [place], [address] Phone: [phone]   Fax:  [phone]  Occupational Therapy Treatment/Progress Update Reporting period from 03/25/2020 to 05/18/2020   Patient Details  Name: James [name] MRN: [id number] Date of Birth: Dec 31, 1940 Referring Provider (OT): Joselyn Arrow   Encounter Date: 05/18/2020   OT End of Session - 05/24/20 2020    Visit Number 50    Number of Visits 72    Date for OT Re-Evaluation 06/29/20    OT Start Time 1059    OT Stop Time 1146    OT Time Calculation (min) 47 min    Activity Tolerance Patient tolerated treatment well    Behavior During Therapy [facility] for tasks assessed/performed           Past Medical History:  Diagnosis Date  . Alcohol abuse, in remission   . Atrial fibrillation ([place])   . Bilateral renal masses   . Closed right ankle fracture   . 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 Gait disorder   . Gout   . Hypertension   . Seizure disorder ([place])   . 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     Past Surgical History:  Procedure Laterality Date  . CRANIECTOMY FOR DEPRESSED SKULL FRACTURE  2009   with MRSA infection  . CRANIOTOMY     X 2  . HERNIA REPAIR     during childhood  . ORIF ANKLE FRACTURE Left 2012    There were no vitals filed for this visit.   Subjective Assessment - 05/24/20 2018    Subjective  Patient indicating he is doing well, he and his wife are going to lunch after therapy.    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  He suffered another fall in 2017.  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     Patient Stated Goals Patient indicates he wants to be stronger, use his right hand for daily tasks, be as independent as possible.     Currently in Pain? No/denies    Multiple Pain Sites No           Progress Update this date, measurements reassessed, refer to flowsheet for specific details, goals updated to reflect progress.   ADL: Patient feeding self at home and in the community when he goes out with this wife.  He uses utensils at times with his right hand, occasionally uses with left if he has difficulty.  He can cut meat at times and wife continues to assess as needed.  He is able to pour a glass of tea but occasionally still spills liquid.  He continues to demonstrate right sided neglect/hemi inattention and will occasionally still knock over his glass on the right side if not paying attention.  When he is holding items in the right hand, he will forget he has them in hand and will drop items.  Responds well to cues and repetition of tasks.  Balance remains impaired especially when patient is responding to dropping items or tasks that require quick, sudden movements.    Neuromuscular Reeducation: Pt continues to demonstrate increased difficulty with manipulation of pegs for 9 hole peg test and requires increased time to complete test.  Worked towards prehension patterns today with  thumb and index finger and thumb and middle finger combinations to manipulate larger ball pegs.  Difficulty picking up from table but can take items when handed to him and place/remove from board.    Response to tx:   Patient has continued to make progress towards goals.  He responds well to repetition of tasks which increases his attention to right side while in the clinic and during tasks at home.  Continuing to work on consistency of tasks as well as carryover of skills into daily functional tasks.  Continues to demonstrate difficulty with manipulation skills and the ability to pick up items from the table and turn to place into grid.  Self care tasks have improved and wife is pleased she can still take patient out to eat and with friends  since pt can still self feed.  Continues to work on amount of spillage at times as well as attention to right side during meal times.  Pt continues to benefit from skilled OT services to maximize safety and independence in necessary daily tasks.     [facility] OT Assessment - 05/24/20 2037      Assessment   Medical Diagnosis CVA/right UE weakness    Referring Provider (OT) Manuella Ghazi, H    Onset Date/Surgical Date 02/21/18    Hand Dominance Right      Coordination   Right 9 Hole Peg Test 3 mins 52 secs    Left 9 Hole Peg Test 29      Hand Function   Right Hand Grip (lbs) 54    Right Hand Lateral Pinch 19 lbs    Right Hand 3 Point Pinch 16 lbs    Left Hand Grip (lbs) 65    Left Hand Lateral Pinch 23 lbs    Left 3 point pinch 18 lbs                            OT Education - 05/24/20 2019    Education provided Yes    Education Details safety with balance especially when retrieving items from the floor.    Person(s) Educated Patient;Spouse    Methods Explanation;Demonstration;Verbal cues    Comprehension Verbalized understanding;Returned demonstration;Verbal cues required               OT Long Term Goals - 05/24/20 2021      OT LONG TERM GOAL #1   Title Patient will demonstrate use of utensil in right hand for self feeding with modified independence with little to no spillage.     Baseline unable at eval to hold utensil, 10th visit holding utensil, some feeding. 20th visit, feeding self consistently but still has some moderate spillage    Time 12    Period Weeks    Status Achieved      OT LONG TERM GOAL #2   Title Pt will increase hand strength in right by 5# to be able to cut food with modified independence.    Baseline unable to cut meat at evaluation, 10th visit starting to work on cutting meat, patient reports difficulty with cutting meat    Time 12    Period Weeks    Status Achieved      OT LONG TERM GOAL #3   Title Patient will demonstrate increased  awareness and strategies to attend to right side to hold items without dropping items.    Baseline difficulty with detecting items in right hand at eval, forgets items are in  hand, 10th visit improved but still working on 12-30-2019: minimal dropping of items this date.  20th visit continued dropping of items occasionally but with increased awareness.  Update:  remains inconsistent but does well with repeated cues    Time 12    Period Weeks    Status On-going    Target Date 06/29/20      OT LONG TERM GOAL #4   Title Patient will demonstrate independence in home exercise program for ROM, strength and coordination of right hand.    Baseline eval-patient has not been performing exercises from last episode of care and demonstrates decline, 10th visit doing exercises at home and continue to modify program. 20th visit pt continues to engage in HEP with upgrades frequently    Time 12    Period Weeks    Status Partially Met    Target Date 06/29/20      OT LONG TERM GOAL #5   Title Pt to demonstrate improved balance with transitional movements with sit to stand from all surfaces with no loss of balance to reduce fall risk.    Time 12    Period Weeks    Status On-going    Target Date 06/29/20      OT LONG TERM GOAL #6   Title Patient will demonstrate pouring a drink with the right hand with attention to right side and not knocking the cup over and spilling.      Baseline unable at eval, 10th visit starting to use right hand more and attempting to pour with right.  20th visit still has difficulty at times.    Time 12    Period Weeks    Status On-going    Target Date 06/29/20                 Plan - 05/24/20 2021    Clinical Impression Statement Patient has continued to make progress towards goals.  He responds well to repetition of tasks which increases his attention to right side while in the clinic and during tasks at home.  Continuing to work on consistency of tasks as well as carryover  of skills into daily functional tasks.  Continues to demonstrate difficulty with manipulation skills and the ability to pick up items from the table and turn to place into grid.  Self care tasks have improved and wife is pleased she can still take patient out to eat and with friends since pt can still self feed.  Continues to work on amount of spillage at times as well as attention to right side during meal times.  Pt continues to benefit from skilled OT services to maximize safety and independence in necessary daily tasks.    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    Occupational performance deficits (Please refer to evaluation for details): ADL's;IADL's;Leisure    Body Structure / Function / Physical Skills ADL;Dexterity;Flexibility;ROM;Strength;Vision;Balance;Coordination;FMC;IADL;Tone;Endurance;UE functional use;Mobility    Cognitive Skills Attention;Safety Awareness;Problem Solve    Psychosocial Skills Environmental  Adaptations;Routines and Behaviors;Habits;Interpersonal Interaction    Rehab Potential Good    Clinical Decision Making Several treatment options, min-mod task modification necessary    Comorbidities Affecting Occupational Performance: May have comorbidities impacting occupational performance    Modification or Assistance to  Complete Evaluation  Min-Moderate modification of tasks or assist with assess necessary to complete eval    OT Frequency 2x / week    OT Duration 12 weeks    OT Treatment/Interventions Self-care/ADL training;Visual/perceptual remediation/compensation;DME and/or AE instruction;Patient/family education;Moist Heat;Therapeutic  exercise;Manual Therapy;Therapeutic activities;Neuromuscular education    Consulted and Agree with Plan of Care Patient    Family Member Consulted wife, [name]           Patient will benefit from skilled therapeutic intervention in order to improve the following deficits and impairments:   Body Structure / Function / Physical Skills: ADL, Dexterity, Flexibility, ROM, Strength, Vision, Balance, Coordination, FMC, IADL, Tone, Endurance, UE functional use, Mobility Cognitive Skills: Attention, Safety Awareness, Problem Solve Psychosocial Skills: Environmental  Adaptations, Routines and Behaviors, Habits, Interpersonal Interaction   Visit Diagnosis: Muscle weakness (generalized)  Other lack of coordination  Neurologic neglect syndrome  Unsteadiness on feet    Problem List Patient Active Problem List   Diagnosis Date Noted  . Sleep disturbance   . PAF (paroxysmal atrial fibrillation) ([place])   . Recurrent UTI   . Hypokalemia   . Hyponatremia   . Abnormal urine   . 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 Benign essential HTN   . Urinary retention   . History of gout   . Global aphasia   . SAH (subarachnoid hemorrhage) ([place])   . SDH (subdural hematoma) ([place])   . Lymphocytosis   . Subdural hemorrhage following injury [facility]) 05/12/2016   Rosealynn Mateus T Prarthana Parlin, OTR/L, CLT  Mackenzee Becvar 05/24/2020, 8:38 PM  [place] MAIN [facility] SERVICES [address] [place], [place], [address] Phone: [phone]   Fax:  [phone]  Name: James [name] MRN: [id number] Date of Birth: 1940-12-17

## 2020-05-24 NOTE — Therapy (Signed)
[name] SERVICES [address] [place], [place], [address] Phone: [phone]   Fax:  [phone]  Occupational Therapy Treatment  Patient Details  Name: James [name] MRN: [id number] Date of Birth: 1941-01-15 Referring Provider (OT): Joselyn Arrow   Encounter Date: 05/20/2020   OT End of Session - 05/24/20 2108    Visit Number 51    Number of Visits 67    Date for OT Re-Evaluation 06/29/20    OT Start Time 1100    OT Stop Time 1146    OT Time Calculation (min) 46 min    Activity Tolerance Patient tolerated treatment well    Behavior During Therapy [facility] for tasks assessed/performed           Past Medical History:  Diagnosis Date  . Alcohol abuse, in remission   . Atrial fibrillation ([place])   . Bilateral renal masses   . Closed right ankle fracture   . 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 Gait disorder   . Gout   . Hypertension   . Seizure disorder ([place])   . 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     Past Surgical History:  Procedure Laterality Date  . CRANIECTOMY FOR DEPRESSED SKULL FRACTURE  2009   with MRSA infection  . CRANIOTOMY     X 2  . HERNIA REPAIR     during childhood  . ORIF ANKLE FRACTURE Left 2012    There were no vitals filed for this visit.   Subjective Assessment - 05/24/20 2107    Subjective  Patient smiling indicating he is ready to get started.    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  He suffered another fall in 2017.  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     Patient Stated Goals Patient indicates he wants to be stronger, use his right hand for daily tasks, be as independent as possible.    Currently in Pain? No/denies    Multiple Pain Sites No          Neuromuscular  Reeducation:  Patient seen with use of Grooved pegs working towards picking up from table with moderate difficulty to turn to place into board, able to remove from board once placed in, cues for prehension patterns, difficulty with turning to place into "lock" position.   Switched to use of Connect 4 board with checkers to pick up from tabletop, cues to bring to edge of table to get thumb underneath checker to pick up.  Placing into board with cues for tip to tip patterns, tends to use a lateral pinch.    Response to tx: Patient continues to work towards reinforcement of prehension patterns for tip to tip pattern to pick up items, continues to demo difficulty with manipulation of turning items and isolated finger movements. Pt responds well to cues both verbal and tactile.  Continue to work towards improving functional hand skills for use in daily tasks.                       OT Education - 05/24/20 2107    Education provided Yes    Education Details balance, prehension patterns, right sided attention    Person(s) Educated Patient;Spouse    Methods Explanation;Demonstration;Verbal  cues    Comprehension Verbalized understanding;Returned demonstration;Verbal cues required               OT Long Term Goals - 05/24/20 2021      OT LONG TERM GOAL #1   Title Patient will demonstrate use of utensil in right hand for self feeding with modified independence with little to no spillage.     Baseline unable at eval to hold utensil, 10th visit holding utensil, some feeding. 20th visit, feeding self consistently but still has some moderate spillage    Time 12    Period Weeks    Status Achieved      OT LONG TERM GOAL #2   Title Pt will increase hand strength in right by 5# to be able to cut food with modified independence.    Baseline unable to cut meat at evaluation, 10th visit starting to work on cutting meat, patient reports difficulty with cutting meat    Time 12    Period  Weeks    Status Achieved      OT LONG TERM GOAL #3   Title Patient will demonstrate increased awareness and strategies to attend to right side to hold items without dropping items.    Baseline difficulty with detecting items in right hand at eval, forgets items are in hand, 10th visit improved but still working on 12-30-2019: minimal dropping of items this date.  20th visit continued dropping of items occasionally but with increased awareness.  Update:  remains inconsistent but does well with repeated cues    Time 12    Period Weeks    Status On-going    Target Date 06/29/20      OT LONG TERM GOAL #4   Title Patient will demonstrate independence in home exercise program for ROM, strength and coordination of right hand.    Baseline eval-patient has not been performing exercises from last episode of care and demonstrates decline, 10th visit doing exercises at home and continue to modify program. 20th visit pt continues to engage in HEP with upgrades frequently    Time 12    Period Weeks    Status Partially Met    Target Date 06/29/20      OT LONG TERM GOAL #5   Title Pt to demonstrate improved balance with transitional movements with sit to stand from all surfaces with no loss of balance to reduce fall risk.    Time 12    Period Weeks    Status On-going    Target Date 06/29/20      OT LONG TERM GOAL #6   Title Patient will demonstrate pouring a drink with the right hand with attention to right side and not knocking the cup over and spilling.      Baseline unable at eval, 10th visit starting to use right hand more and attempting to pour with right.  20th visit still has difficulty at times.    Time 12    Period Weeks    Status On-going    Target Date 06/29/20                 Plan - 05/24/20 2108    Clinical Impression Statement Patient continues to work towards reinforcement of prehension patterns for tip to tip pattern to pick up items, continues to demo difficulty with  manipulation of turning items and isolated finger movements. Pt responds well to cues both verbal and tactile.  Continue to work towards improving functional hand skills for use  in daily tasks.    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    Occupational performance deficits (Please refer to evaluation for details): ADL's;IADL's;Leisure    Body Structure / Function / Physical Skills ADL;Dexterity;Flexibility;ROM;Strength;Vision;Balance;Coordination;FMC;IADL;Tone;Endurance;UE functional use;Mobility    Cognitive Skills Attention;Safety Awareness;Problem Solve    Psychosocial Skills Environmental  Adaptations;Routines and Behaviors;Habits;Interpersonal Interaction    Rehab Potential Good    Clinical Decision Making Several treatment options, min-mod task modification necessary    Comorbidities Affecting Occupational Performance: May have comorbidities impacting occupational performance    Modification or Assistance to Complete Evaluation  Min-Moderate modification of tasks or assist with assess necessary to complete eval    OT Frequency 2x / week    OT Duration 12 weeks    OT Treatment/Interventions Self-care/ADL training;Visual/perceptual remediation/compensation;DME and/or AE instruction;Patient/family education;Moist Heat;Therapeutic exercise;Manual Therapy;Therapeutic activities;Neuromuscular education    Consulted and Agree with Plan of Care Patient    Family Member Consulted wife, [name]           Patient will benefit from skilled therapeutic intervention in order to improve the following deficits and impairments:   Body Structure / Function / Physical Skills:  ADL, Dexterity, Flexibility, ROM, Strength, Vision, Balance, Coordination, FMC, IADL, Tone, Endurance, UE functional use, Mobility Cognitive Skills: Attention, Safety Awareness, Problem Solve Psychosocial Skills: Environmental  Adaptations, Routines and Behaviors, Habits, Interpersonal Interaction   Visit Diagnosis: Muscle weakness (generalized)  Other lack of coordination  Neurologic neglect syndrome  Unsteadiness on feet    Problem List Patient Active Problem List   Diagnosis Date Noted  . Sleep disturbance   . PAF (paroxysmal atrial fibrillation) ([place])   . Recurrent UTI   . Hypokalemia   . Hyponatremia   . Abnormal urine   . 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 Benign essential HTN   . Urinary retention   . History of gout   . Global aphasia   . SAH (subarachnoid hemorrhage) ([place])   . SDH (subdural hematoma) ([place])   . Lymphocytosis   . Subdural hemorrhage following injury [facility]) 05/12/2016   Rashee Marschall T Mackinley Cassaday, OTR/L, CLT  Terrel Nesheiwat 05/24/2020, 9:17 PM  [place] MAIN [facility] SERVICES [address] [place], [place], [address] Phone: [phone]   Fax:  [phone]  Name: James [name] MRN: [id number] Date of Birth: 10-21-1940

## 2020-05-25 ENCOUNTER — Ambulatory Visit: Payer: PPO | Admitting: Occupational Therapy

## 2020-05-27 ENCOUNTER — Ambulatory Visit: Payer: PPO | Attending: Neurology | Admitting: Occupational Therapy

## 2020-05-27 ENCOUNTER — Other Ambulatory Visit: Payer: Self-pay

## 2020-05-27 DIAGNOSIS — M6281 Muscle weakness (generalized): Secondary | ICD-10-CM

## 2020-05-27 DIAGNOSIS — R414 Neurologic neglect syndrome: Secondary | ICD-10-CM | POA: Insufficient documentation

## 2020-05-27 DIAGNOSIS — R262 Difficulty in walking, not elsewhere classified: Secondary | ICD-10-CM | POA: Insufficient documentation

## 2020-05-27 DIAGNOSIS — R2681 Unsteadiness on feet: Secondary | ICD-10-CM | POA: Diagnosis not present

## 2020-05-27 DIAGNOSIS — R278 Other lack of coordination: Secondary | ICD-10-CM | POA: Diagnosis not present

## 2020-05-29 ENCOUNTER — Encounter: Payer: Self-pay | Admitting: Occupational Therapy

## 2020-05-29 NOTE — Therapy (Signed)
[place] MAIN [facility] SERVICES [address] [place], [place], [address] Phone: [phone]   Fax:  [phone]  Occupational Therapy Treatment  Patient Details  Name: James [name] MRN: [id number] Date of Birth: 08-27-1940 Referring Provider (OT): Joselyn Arrow   Encounter Date: 05/27/2020   OT End of Session - 05/29/20 1506    Visit Number 33    Number of Visits 36    Date for OT Re-Evaluation 06/29/20    OT Start Time 1058    OT Stop Time 1145    OT Time Calculation (min) 47 min    Activity Tolerance Patient tolerated treatment well    Behavior During Therapy [facility] for tasks assessed/performed           Past Medical History:  Diagnosis Date  . Alcohol abuse, in remission   . Atrial fibrillation ([place])   . Bilateral renal masses   . Closed right ankle fracture   . 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 Gait disorder   . Gout   . Hypertension   . Seizure disorder ([place])   . 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     Past Surgical History:  Procedure Laterality Date  . CRANIECTOMY FOR DEPRESSED SKULL FRACTURE  2009   with MRSA infection  . CRANIOTOMY     X 2  . HERNIA REPAIR     during childhood  . ORIF ANKLE FRACTURE Left 2012    There were no vitals filed for this visit.   Subjective Assessment - 05/29/20 1504    Subjective  Patient indicates he is doing well, reports working on his hand at home with exercises.    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  He suffered another fall in 2017.  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     Patient Stated Goals Patient indicates he wants to be stronger, use his right hand for daily tasks, be as independent as possible.    Currently in Pain? No/denies    Multiple Pain Sites No            Neuromuscular Reeducation: Pt seen for manipulation of glass beads from tabletop, some with flat side down, others flat side up.   Cues for prehension patterns with use of thumb and index for tip to tip patterns to pick up.  Pt performs best with items flat side up.   Attempted to work towards translatory movements of the hand and using the hand for storage however pt drops items frequently and continues to demonstrate difficulty with this skill.  Continued focus on tip to tip prehension patterns with picking up of small objects 1/2 to 1 inch in size.  Cues for proper form and technique.  Grasping techniques for pen use in prep for handwriting.  Cues and therapist demo for pen grasp, tactile cues provided.   Response to tx:   Patient continues to demonstrate difficulty with manipulation skills, improved ability to pick up items from table and does best with flat side up with attempts.  Has difficulty with performing translatory skills of the hand and using the hand for storage.  Decreased pen grasp and unstable tripod grasp.  Patient tends to do best with medium sized pen with gripping patterns.  Continue to work  towards goals to improve functional hand use for daily tasks.                      OT Education - 05/29/20 1505    Education provided Yes    Education Details manipulation skills, functional hand exercises    Person(s) Educated Patient;Spouse    Methods Explanation;Demonstration;Verbal cues    Comprehension Verbalized understanding;Returned demonstration;Verbal cues required               OT Long Term Goals - 05/24/20 2021      OT LONG TERM GOAL #1   Title Patient will demonstrate use of utensil in right hand for self feeding with modified independence with little to no spillage.     Baseline unable at eval to hold utensil, 10th visit holding utensil, some feeding. 20th visit, feeding self consistently but still has some moderate spillage    Time 12     Period Weeks    Status Achieved      OT LONG TERM GOAL #2   Title Pt will increase hand strength in right by 5# to be able to cut food with modified independence.    Baseline unable to cut meat at evaluation, 10th visit starting to work on cutting meat, patient reports difficulty with cutting meat    Time 12    Period Weeks    Status Achieved      OT LONG TERM GOAL #3   Title Patient will demonstrate increased awareness and strategies to attend to right side to hold items without dropping items.    Baseline difficulty with detecting items in right hand at eval, forgets items are in hand, 10th visit improved but still working on 12-30-2019: minimal dropping of items this date.  20th visit continued dropping of items occasionally but with increased awareness.  Update:  remains inconsistent but does well with repeated cues    Time 12    Period Weeks    Status On-going    Target Date 06/29/20      OT LONG TERM GOAL #4   Title Patient will demonstrate independence in home exercise program for ROM, strength and coordination of right hand.    Baseline eval-patient has not been performing exercises from last episode of care and demonstrates decline, 10th visit doing exercises at home and continue to modify program. 20th visit pt continues to engage in HEP with upgrades frequently    Time 12    Period Weeks    Status Partially Met    Target Date 06/29/20      OT LONG TERM GOAL #5   Title Pt to demonstrate improved balance with transitional movements with sit to stand from all surfaces with no loss of balance to reduce fall risk.    Time 12    Period Weeks    Status On-going    Target Date 06/29/20      OT LONG TERM GOAL #6   Title Patient will demonstrate pouring a drink with the right hand with attention to right side and not knocking the cup over and spilling.      Baseline unable at eval, 10th visit starting to use right hand more and attempting to pour with right.  20th visit still has  difficulty at times.    Time 12    Period Weeks    Status On-going    Target Date 06/29/20  Plan - 05/29/20 1506    Clinical Impression Statement Patient continues to demonstrate difficulty with manipulation skills, improved ability to pick up items from table and does best with flat side up with attempts.  Has difficulty with performing translatory skills of the hand and using the hand for storage.  Decreased pen grasp and unstable tripod grasp.  Patient tends to do best with medium sized pen with gripping patterns.  Continue to work towards goals to improve functional hand use for daily tasks.    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    Occupational performance deficits (Please refer to evaluation for details): ADL's;IADL's;Leisure    Body Structure / Function / Physical Skills ADL;Dexterity;Flexibility;ROM;Strength;Vision;Balance;Coordination;FMC;IADL;Tone;Endurance;UE functional use;Mobility    Cognitive Skills Attention;Safety Awareness;Problem Solve    Psychosocial Skills Environmental  Adaptations;Routines and Behaviors;Habits;Interpersonal Interaction    Rehab Potential Good    Clinical Decision Making Several treatment options, min-mod task modification necessary    Comorbidities Affecting Occupational Performance: May have comorbidities impacting occupational performance    Modification or Assistance to Complete Evaluation  Min-Moderate modification of tasks or assist with assess necessary to complete eval    OT Frequency 2x / week    OT Duration 12 weeks    OT Treatment/Interventions Self-care/ADL training;Visual/perceptual  remediation/compensation;DME and/or AE instruction;Patient/family education;Moist Heat;Therapeutic exercise;Manual Therapy;Therapeutic activities;Neuromuscular education    Consulted and Agree with Plan of Care Patient    Family Member Consulted wife, [name]           Patient will benefit from skilled therapeutic intervention in order to improve the following deficits and impairments:   Body Structure / Function / Physical Skills: ADL, Dexterity, Flexibility, ROM, Strength, Vision, Balance, Coordination, FMC, IADL, Tone, Endurance, UE functional use, Mobility Cognitive Skills: Attention, Safety Awareness, Problem Solve Psychosocial Skills: Environmental  Adaptations, Routines and Behaviors, Habits, Interpersonal Interaction   Visit Diagnosis: Muscle weakness (generalized)  Other lack of coordination  Neurologic neglect syndrome  Unsteadiness on feet    Problem List Patient Active Problem List   Diagnosis Date Noted  . Sleep disturbance   . PAF (paroxysmal atrial fibrillation) ([place])   . Recurrent UTI   . Hypokalemia   . Hyponatremia   . Abnormal urine   . 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 Benign essential HTN   . Urinary retention   . History of gout   . Global aphasia   . SAH (subarachnoid hemorrhage) ([place])   . SDH (subdural hematoma) ([place])   . Lymphocytosis   . Subdural hemorrhage following injury [facility]) 05/12/2016   Breiana Stratmann T Lynisha Osuch, OTR/L, CLT  Colleen Kotlarz 05/29/2020, 3:16 PM  [place] MAIN [facility] SERVICES [address] [place], [place], [address] Phone: [phone]   Fax:  [phone]  Name: James [name] MRN: [id number] Date of Birth: [birth year]

## 2020-06-01 ENCOUNTER — Encounter: Payer: Self-pay | Admitting: Occupational Therapy

## 2020-06-01 ENCOUNTER — Ambulatory Visit: Payer: PPO | Admitting: Occupational Therapy

## 2020-06-01 ENCOUNTER — Other Ambulatory Visit: Payer: Self-pay

## 2020-06-01 DIAGNOSIS — R278 Other lack of coordination: Secondary | ICD-10-CM

## 2020-06-01 DIAGNOSIS — R414 Neurologic neglect syndrome: Secondary | ICD-10-CM

## 2020-06-01 DIAGNOSIS — M6281 Muscle weakness (generalized): Secondary | ICD-10-CM | POA: Diagnosis not present

## 2020-06-01 DIAGNOSIS — R262 Difficulty in walking, not elsewhere classified: Secondary | ICD-10-CM

## 2020-06-01 DIAGNOSIS — R2681 Unsteadiness on feet: Secondary | ICD-10-CM

## 2020-06-03 ENCOUNTER — Ambulatory Visit: Payer: PPO | Admitting: Occupational Therapy

## 2020-06-03 ENCOUNTER — Encounter: Payer: Self-pay | Admitting: Occupational Therapy

## 2020-06-03 ENCOUNTER — Other Ambulatory Visit: Payer: Self-pay

## 2020-06-03 DIAGNOSIS — R2681 Unsteadiness on feet: Secondary | ICD-10-CM

## 2020-06-03 DIAGNOSIS — M6281 Muscle weakness (generalized): Secondary | ICD-10-CM | POA: Diagnosis not present

## 2020-06-03 DIAGNOSIS — R414 Neurologic neglect syndrome: Secondary | ICD-10-CM

## 2020-06-03 DIAGNOSIS — R278 Other lack of coordination: Secondary | ICD-10-CM

## 2020-06-03 NOTE — Therapy (Signed)
[place] MAIN [facility] SERVICES [address] [place], [place], [address] Phone: [phone]   Fax:  [phone]  Occupational Therapy Treatment  Patient Details  Name: James [name] MRN: [id number] Date of Birth: 08-27-1940 Referring Provider (OT): Joselyn Arrow   Encounter Date: 06/03/2020   OT End of Session - 06/08/20 0853    Visit Number 20    Number of Visits 72    Date for OT Re-Evaluation 06/29/20    OT Start Time 1055    OT Stop Time 1145    OT Time Calculation (min) 50 min    Activity Tolerance Patient tolerated treatment well    Behavior During Therapy [facility] for tasks assessed/performed           Past Medical History:  Diagnosis Date  . Alcohol abuse, in remission   . Atrial fibrillation ([place])   . Bilateral renal masses   . Closed right ankle fracture   . 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 Gait disorder   . Gout   . Hypertension   . Seizure disorder ([place])   . 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     Past Surgical History:  Procedure Laterality Date  . CRANIECTOMY FOR DEPRESSED SKULL FRACTURE  2009   with MRSA infection  . CRANIOTOMY     X 2  . HERNIA REPAIR     during childhood  . ORIF ANKLE FRACTURE Left 2012    There were no vitals filed for this visit.   Subjective Assessment - 06/08/20 0852    Subjective  Patient indicating he has been doing his exercises at home, has been engaging in some handwriting tasks but didn't bring in the pen he likes to use at home.    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  He suffered another fall in 2017.  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     Patient Stated Goals Patient indicates he wants to be stronger, use his right hand for daily tasks, be as independent as  possible.    Currently in Pain? No/denies    Multiple Pain Sites No          Therapeutic Exercise: Reciprocal arm movement, ROM, strengthening and secure grip with use of UBE from seated position, resistance of 2.7 to 3.1, forwards and backwards alternating directions with therapist in constant attendance to ensure grip and adjust settings, 8 mins total with one short rest break.  Multi directional reaching patterns with right UE to place items onto shelf and remove items in containers from standing position with min guard.    Neuromuscular reeducation:   Manipulation of Coins from tabletop, patient often has to slide coin to the edge of the table to pick up using proper prehension pattern with tip of index to tip of thumb, placing into resistive bank placed in elevation to encourage reach with right hand.   Knotting exercises with medium nylon rope, knotting and unknotting, encouraging index/thumb prehension pattern.    ADL: Handwriting this date with medium grip pen, large lined paper, cues for grasping patterns on pen for writing name in print and script.  Multiple trials and lines completed.     Response to tx:   Patient required cues to use right hand at the  beginning of the session but once cued, he was more consistent with using right hand.  Patient continues to work to reinforce prehension patterns with utilizing tip to tip of index and thumb to pick up and manipulate objects.  Patient performs best when tasks are bilateral in nature so he is automatically forced to use his right hand for the task and may benefit from some overflow from the other hand.  Handwriting has continued to show progress but still lacks legibility.  Patient tends to write in script than in print.  Continue to work towards goals in plan of care to improve right hand functional use for necessary daily tasks at home and in the community.                       OT Education - 06/08/20 0853     Education provided Yes    Education Details manipulation skills, functional hand exercises, handwriting skills    Person(s) Educated Patient;Spouse    Methods Explanation;Demonstration;Verbal cues    Comprehension Verbalized understanding;Returned demonstration;Verbal cues required               OT Long Term Goals - 05/24/20 2021      OT LONG TERM GOAL #1   Title Patient will demonstrate use of utensil in right hand for self feeding with modified independence with little to no spillage.     Baseline unable at eval to hold utensil, 10th visit holding utensil, some feeding. 20th visit, feeding self consistently but still has some moderate spillage    Time 12    Period Weeks    Status Achieved      OT LONG TERM GOAL #2   Title Pt will increase hand strength in right by 5# to be able to cut food with modified independence.    Baseline unable to cut meat at evaluation, 10th visit starting to work on cutting meat, patient reports difficulty with cutting meat    Time 12    Period Weeks    Status Achieved      OT LONG TERM GOAL #3   Title Patient will demonstrate increased awareness and strategies to attend to right side to hold items without dropping items.    Baseline difficulty with detecting items in right hand at eval, forgets items are in hand, 10th visit improved but still working on 12-30-2019: minimal dropping of items this date.  20th visit continued dropping of items occasionally but with increased awareness.  Update:  remains inconsistent but does well with repeated cues    Time 12    Period Weeks    Status On-going    Target Date 06/29/20      OT LONG TERM GOAL #4   Title Patient will demonstrate independence in home exercise program for ROM, strength and coordination of right hand.    Baseline eval-patient has not been performing exercises from last episode of care and demonstrates decline, 10th visit doing exercises at home and continue to modify program. 20th visit pt  continues to engage in HEP with upgrades frequently    Time 12    Period Weeks    Status Partially Met    Target Date 06/29/20      OT LONG TERM GOAL #5   Title Pt to demonstrate improved balance with transitional movements with sit to stand from all surfaces with no loss of balance to reduce fall risk.    Time 12    Period Weeks  Status On-going    Target Date 06/29/20      OT LONG TERM GOAL #6   Title Patient will demonstrate pouring a drink with the right hand with attention to right side and not knocking the cup over and spilling.      Baseline unable at eval, 10th visit starting to use right hand more and attempting to pour with right.  20th visit still has difficulty at times.    Time 12    Period Weeks    Status On-going    Target Date 06/29/20                 Plan - 06/08/20 0853    Clinical Impression Statement Patient required cues to use right hand at the beginning of the session but once cued, he was more consistent with using right hand.  Patient continues to work to reinforce prehension patterns with utilizing tip to tip of index and thumb to pick up and manipulate objects.  Patient performs best when tasks are bilateral in nature so he is automatically forced to use his right hand for the task and may benefit from some overflow from the other hand.  Handwriting has continued to show progress but still lacks legibility.  Patient tends to write in script than in print.  Continue to work towards goals in plan of care to improve right hand functional use for necessary daily tasks at home and in the community.    OT Occupational Profile and History Detailed Assessment- Review of Records and additional review of physical, cognitive, psychosocial history related to current functional performance    Occupational Profile and client history currently impacting functional performance repeated falls, TBI, expressive aphasia, limited motion in right hand, lack of coordination  from previous CVA    Occupational performance deficits (Please refer to evaluation for details): ADL's;IADL's;Leisure    Body Structure / Function / Physical Skills ADL;Dexterity;Flexibility;ROM;Strength;Vision;Balance;Coordination;FMC;IADL;Tone;Endurance;UE functional use;Mobility    Cognitive Skills Attention;Safety Awareness;Problem Solve    Psychosocial Skills Environmental  Adaptations;Routines and Behaviors;Habits;Interpersonal Interaction    Rehab Potential Good    Clinical Decision Making Several treatment options, min-mod task modification necessary    Comorbidities Affecting Occupational Performance: May have comorbidities impacting occupational performance    Modification or Assistance to Complete Evaluation  Min-Moderate modification of tasks or assist with assess necessary to complete eval    OT Frequency 2x / week    OT Duration 12 weeks    OT Treatment/Interventions Self-care/ADL training;Visual/perceptual remediation/compensation;DME and/or AE instruction;Patient/family education;Moist Heat;Therapeutic exercise;Manual Therapy;Therapeutic activities;Neuromuscular education    Consulted and Agree with Plan of Care Patient    Family Member Consulted wife, [name]           Patient will benefit from skilled therapeutic intervention in order to improve the following deficits and impairments:   Body Structure / Function / Physical Skills: ADL, Dexterity, Flexibility, ROM, Strength, Vision, Balance, Coordination, FMC, IADL, Tone, Endurance, UE functional use, Mobility Cognitive Skills: Attention, Safety Awareness, Problem Solve Psychosocial Skills: Environmental  Adaptations, Routines and Behaviors, Habits, Interpersonal Interaction   Visit Diagnosis: Other lack of coordination  Neurologic neglect syndrome  Unsteadiness on feet  Muscle weakness (generalized)    Problem List Patient Active Problem List   Diagnosis Date Noted  . Sleep disturbance   . PAF (paroxysmal  atrial fibrillation) ([place])   . Recurrent UTI   . Hypokalemia   . Hyponatremia   . Abnormal urine   . 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  Benign essential HTN   . Urinary retention   . History of gout   . Global aphasia   . SAH (subarachnoid hemorrhage) ([place])   . SDH (subdural hematoma) ([place])   . Lymphocytosis   . Subdural hemorrhage following injury ([place]) 05/12/2016   Madilyne Tadlock T Shley Dolby, OTR/L, CLT  Kashari Chalmers 06/08/2020, 10:09 AM  [place], [place], [address] Phone: [phone]   Fax:  [phone]  Name: James [name] MRN: [id number] Date of Birth: Nov 19, 1940

## 2020-06-03 NOTE — Therapy (Signed)
[place] MAIN [facility] SERVICES [address] [place], [place], [address] Phone: [phone]   Fax:  [phone]  Occupational Therapy Treatment  Patient Details  Name: James [name] MRN: [id number] Date of Birth: 02-Aug-1940 Referring Provider (OT): Joselyn Arrow   Encounter Date: 06/01/2020   OT End of Session - 06/02/20 1028    Visit Number 40    Number of Visits 34    Date for OT Re-Evaluation 06/29/20    OT Start Time 1100    OT Stop Time 1146    OT Time Calculation (min) 46 min    Activity Tolerance Patient tolerated treatment well    Behavior During Therapy [facility] for tasks assessed/performed           Past Medical History:  Diagnosis Date  . Alcohol abuse, in remission   . Atrial fibrillation ([place])   . Bilateral renal masses   . Closed right ankle fracture   . 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 Gait disorder   . Gout   . Hypertension   . Seizure disorder ([place])   . 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     Past Surgical History:  Procedure Laterality Date  . CRANIECTOMY FOR DEPRESSED SKULL FRACTURE  2009   with MRSA infection  . CRANIOTOMY     X 2  . HERNIA REPAIR     during childhood  . ORIF ANKLE FRACTURE Left 2012    There were no vitals filed for this visit.   Subjective Assessment - 06/02/20 1027    Subjective  Patient demonstrating exercises with his right thumb, showing therapist what he has been working on at home.    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  He suffered another fall in 2017.  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     Patient Stated Goals Patient indicates he wants to be stronger, use his right hand for daily tasks, be as independent as possible.    Currently in Pain? No/denies     Multiple Pain Sites No          Neuro:  Patient seen this date for balance tasks with gathering supplies for session from high and low cabinets and carrying items to and from the table.  Min guard with bending, stooping to gather items in lower cabinets.  Patient also requires min assist to min guard when retrieving items from floor level when items are dropped.    Additional focus on right hand manipulation skills with use of Bethena Roys board, working on picking up items from tabletop and placing into grid and following a definitive pattern for right sided attention/awareness.  Cues for prehension patterns to work more on tip to tip and thumb to middle finger with index finger working in isolation to attempt to turn items.  Patient performing well with design, required 3 cues to correct missed items.  Dropped 2 items this date and required min assist to min guard to retrieve.    Response to tx;   Patient continues to progress well with right hand use with improving hand function for daily tasks.  Patient continues to prefer and initiate use of right hand with a lateral pinch to pick up to attempt manipulation but responds well to cues for  a tip to tip pattern and benefits from repetition.  Decreased cues to use right hand, was able to self correct when attempting to use left hand during session without therapist prompt today.  Continue to work towards goals in plan of care to maximize safety and independence with ADL and IADL tasks at home and in the community.                      OT Education - 06/03/20 1027    Education provided Yes    Education Details manipulation skills, functional hand exercises    Person(s) Educated Patient;Spouse    Methods Explanation;Demonstration;Verbal cues    Comprehension Verbalized understanding;Returned demonstration;Verbal cues required               OT Long Term Goals - 05/24/20 2021      OT LONG TERM GOAL #1   Title Patient will  demonstrate use of utensil in right hand for self feeding with modified independence with little to no spillage.     Baseline unable at eval to hold utensil, 10th visit holding utensil, some feeding. 20th visit, feeding self consistently but still has some moderate spillage    Time 12    Period Weeks    Status Achieved      OT LONG TERM GOAL #2   Title Pt will increase hand strength in right by 5# to be able to cut food with modified independence.    Baseline unable to cut meat at evaluation, 10th visit starting to work on cutting meat, patient reports difficulty with cutting meat    Time 12    Period Weeks    Status Achieved      OT LONG TERM GOAL #3   Title Patient will demonstrate increased awareness and strategies to attend to right side to hold items without dropping items.    Baseline difficulty with detecting items in right hand at eval, forgets items are in hand, 10th visit improved but still working on 12-30-2019: minimal dropping of items this date.  20th visit continued dropping of items occasionally but with increased awareness.  Update:  remains inconsistent but does well with repeated cues    Time 12    Period Weeks    Status On-going    Target Date 06/29/20      OT LONG TERM GOAL #4   Title Patient will demonstrate independence in home exercise program for ROM, strength and coordination of right hand.    Baseline eval-patient has not been performing exercises from last episode of care and demonstrates decline, 10th visit doing exercises at home and continue to modify program. 20th visit pt continues to engage in HEP with upgrades frequently    Time 12    Period Weeks    Status Partially Met    Target Date 06/29/20      OT LONG TERM GOAL #5   Title Pt to demonstrate improved balance with transitional movements with sit to stand from all surfaces with no loss of balance to reduce fall risk.    Time 12    Period Weeks    Status On-going    Target Date 06/29/20      OT  LONG TERM GOAL #6   Title Patient will demonstrate pouring a drink with the right hand with attention to right side and not knocking the cup over and spilling.      Baseline unable at eval, 10th visit starting to use right hand more  and attempting to pour with right.  20th visit still has difficulty at times.    Time 12    Period Weeks    Status On-going    Target Date 06/29/20                 Plan - 06/02/20 1028    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    Occupational performance deficits (Please refer to evaluation for details): ADL's;IADL's;Leisure    Body Structure / Function / Physical Skills ADL;Dexterity;Flexibility;ROM;Strength;Vision;Balance;Coordination;FMC;IADL;Tone;Endurance;UE functional use;Mobility    Cognitive Skills Attention;Safety Awareness;Problem Solve    Psychosocial Skills Environmental  Adaptations;Routines and Behaviors;Habits;Interpersonal Interaction    Rehab Potential Good    Clinical Decision Making Several treatment options, min-mod task modification necessary    Comorbidities Affecting Occupational Performance: May have comorbidities impacting occupational performance    Modification or Assistance to Complete Evaluation  Min-Moderate modification of tasks or assist with assess necessary to complete eval    OT Frequency 2x / week    OT Duration 12 weeks    OT Treatment/Interventions Self-care/ADL training;Visual/perceptual remediation/compensation;DME and/or AE instruction;Patient/family education;Moist Heat;Therapeutic exercise;Manual Therapy;Therapeutic activities;Neuromuscular education    Consulted and Agree with  Plan of Care Patient    Family Member Consulted wife, [name]           Patient will benefit from skilled therapeutic intervention in order to improve the following deficits and impairments:   Body Structure / Function / Physical Skills: ADL, Dexterity, Flexibility, ROM, Strength, Vision, Balance, Coordination, FMC, IADL, Tone, Endurance, UE functional use, Mobility Cognitive Skills: Attention, Safety Awareness, Problem Solve Psychosocial Skills: Environmental  Adaptations, Routines and Behaviors, Habits, Interpersonal Interaction   Visit Diagnosis: Muscle weakness (generalized)  Other lack of coordination  Neurologic neglect syndrome  Unsteadiness on feet  Difficulty in walking, not elsewhere classified    Problem List Patient Active Problem List   Diagnosis Date Noted  . Sleep disturbance   . PAF (paroxysmal atrial fibrillation) ([place])   . Recurrent UTI   . Hypokalemia   . Hyponatremia   . Abnormal urine   . 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 Benign essential HTN   . Urinary retention   . History of gout   . Global aphasia   . SAH (subarachnoid hemorrhage) ([place])   . SDH (subdural hematoma) ([place])   . Lymphocytosis   . Subdural hemorrhage following injury ([place]) 05/12/2016   Bobie Caris T Mekhai Venuto, OTR/L, CLT  Hasnain Manheim 06/03/2020, 10:29 AM  [place] MAIN [facility] SERVICES [address] [place], [place], [address] Phone: [phone]   Fax:  [phone]  Name: Jovany Disano MRN: [id number] Date of Birth: 1941-05-16

## 2020-06-08 ENCOUNTER — Other Ambulatory Visit: Payer: Self-pay

## 2020-06-08 ENCOUNTER — Ambulatory Visit: Payer: PPO | Admitting: Occupational Therapy

## 2020-06-08 DIAGNOSIS — R2681 Unsteadiness on feet: Secondary | ICD-10-CM

## 2020-06-08 DIAGNOSIS — R278 Other lack of coordination: Secondary | ICD-10-CM

## 2020-06-08 DIAGNOSIS — R414 Neurologic neglect syndrome: Secondary | ICD-10-CM

## 2020-06-08 DIAGNOSIS — M6281 Muscle weakness (generalized): Secondary | ICD-10-CM | POA: Diagnosis not present

## 2020-06-10 ENCOUNTER — Encounter: Payer: Self-pay | Admitting: Occupational Therapy

## 2020-06-10 ENCOUNTER — Ambulatory Visit: Payer: PPO | Admitting: Occupational Therapy

## 2020-06-10 ENCOUNTER — Other Ambulatory Visit: Payer: Self-pay

## 2020-06-10 DIAGNOSIS — R414 Neurologic neglect syndrome: Secondary | ICD-10-CM

## 2020-06-10 DIAGNOSIS — R278 Other lack of coordination: Secondary | ICD-10-CM

## 2020-06-10 DIAGNOSIS — M6281 Muscle weakness (generalized): Secondary | ICD-10-CM

## 2020-06-10 DIAGNOSIS — R262 Difficulty in walking, not elsewhere classified: Secondary | ICD-10-CM

## 2020-06-10 DIAGNOSIS — R2681 Unsteadiness on feet: Secondary | ICD-10-CM

## 2020-06-10 NOTE — Therapy (Signed)
[place] MAIN [facility] SERVICES [address] [place], [place], [address] Phone: [phone]   Fax:  [phone]  Occupational Therapy Treatment  Patient Details  Name: James [name] MRN: [id number] Date of Birth: 1941/02/04 Referring Provider (OT): Joselyn Arrow   Encounter Date: 06/08/2020   OT End of Session - 06/10/20 2004    Visit Number 55    Number of Visits 72    Date for OT Re-Evaluation 06/29/20    OT Start Time 1100    OT Stop Time 1146    OT Time Calculation (min) 46 min    Activity Tolerance Patient tolerated treatment well    Behavior During Therapy [facility] for tasks assessed/performed           Past Medical History:  Diagnosis Date  . Alcohol abuse, in remission   . Atrial fibrillation ([place])   . Bilateral renal masses   . Closed right ankle fracture   . 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 Gait disorder   . Gout   . Hypertension   . Seizure disorder ([place])   . 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     Past Surgical History:  Procedure Laterality Date  . CRANIECTOMY FOR DEPRESSED SKULL FRACTURE  2009   with MRSA infection  . CRANIOTOMY     X 2  . HERNIA REPAIR     during childhood  . ORIF ANKLE FRACTURE Left 2012    There were no vitals filed for this visit.   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  He suffered another fall in 2017.  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     Patient Stated Goals Patient indicates he wants to be stronger, use his right hand for daily tasks, be as independent as possible.    Currently in Pain? No/denies    Multiple Pain Sites No            Patient seen for reciprocal arm movements with use of UBE, forwards/backwards and  alternating levels of resistance, 3.0 to 3.4, emphasis on secure grip.  Therapist in constant attendance to ensure grip and adjust settings.   Manipulation of various sized Pill containers with child safety lock and small beads to simulate pills of various sizes, able to open container with cues, worked towards prehension patterns with tip to tip pinch to pick up and place items into container.  Manipulation of Cards from flat surface to pick up with right hand and sort into piles based on suit and in order. Patient attempting to use left hand only and required cues to use bilateral hands with right hand leading task.   Response to tx:   Patient continues to demonstrate limitations in manipulation skills of right hand but working towards improving prehension patterns.  Difficulty with manipulation of smaller items, less than 1/2 in in size.  Continue to work towards goals in plan of care to improve right hand function for greater independence in daily tasks.                     OT Education - 06/10/20 2002    Education provided Yes    Education Details manipulation skills, functional hand exercises, handwriting  skills    Person(s) Educated Patient;Spouse    Methods Explanation;Demonstration;Verbal cues    Comprehension Verbalized understanding;Returned demonstration;Verbal cues required               OT Long Term Goals - 05/24/20 2021      OT LONG TERM GOAL #1   Title Patient will demonstrate use of utensil in right hand for self feeding with modified independence with little to no spillage.     Baseline unable at eval to hold utensil, 10th visit holding utensil, some feeding. 20th visit, feeding self consistently but still has some moderate spillage    Time 12    Period Weeks    Status Achieved      OT LONG TERM GOAL #2   Title Pt will increase hand strength in right by 5# to be able to cut food with modified independence.    Baseline unable to cut meat at  evaluation, 10th visit starting to work on cutting meat, patient reports difficulty with cutting meat    Time 12    Period Weeks    Status Achieved      OT LONG TERM GOAL #3   Title Patient will demonstrate increased awareness and strategies to attend to right side to hold items without dropping items.    Baseline difficulty with detecting items in right hand at eval, forgets items are in hand, 10th visit improved but still working on 12-30-2019: minimal dropping of items this date.  20th visit continued dropping of items occasionally but with increased awareness.  Update:  remains inconsistent but does well with repeated cues    Time 12    Period Weeks    Status On-going    Target Date 06/29/20      OT LONG TERM GOAL #4   Title Patient will demonstrate independence in home exercise program for ROM, strength and coordination of right hand.    Baseline eval-patient has not been performing exercises from last episode of care and demonstrates decline, 10th visit doing exercises at home and continue to modify program. 20th visit pt continues to engage in HEP with upgrades frequently    Time 12    Period Weeks    Status Partially Met    Target Date 06/29/20      OT LONG TERM GOAL #5   Title Pt to demonstrate improved balance with transitional movements with sit to stand from all surfaces with no loss of balance to reduce fall risk.    Time 12    Period Weeks    Status On-going    Target Date 06/29/20      OT LONG TERM GOAL #6   Title Patient will demonstrate pouring a drink with the right hand with attention to right side and not knocking the cup over and spilling.      Baseline unable at eval, 10th visit starting to use right hand more and attempting to pour with right.  20th visit still has difficulty at times.    Time 12    Period Weeks    Status On-going    Target Date 06/29/20                 Plan - 06/10/20 2004    Clinical Impression Statement Patient continues to  demonstrate limitations in manipulation skills of right hand but working towards improving prehension patterns.  Difficulty with manipulation of smaller items, less than 1/2 in in size.  Continue to work towards goals in plan of  care to improve right hand function for greater independence in daily tasks.    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    Occupational performance deficits (Please refer to evaluation for details): ADL's;IADL's;Leisure    Body Structure / Function / Physical Skills ADL;Dexterity;Flexibility;ROM;Strength;Vision;Balance;Coordination;FMC;IADL;Tone;Endurance;UE functional use;Mobility    Cognitive Skills Attention;Safety Awareness;Problem Solve    Psychosocial Skills Environmental  Adaptations;Routines and Behaviors;Habits;Interpersonal Interaction    Rehab Potential Good    Clinical Decision Making Several treatment options, min-mod task modification necessary    Comorbidities Affecting Occupational Performance: May have comorbidities impacting occupational performance    Modification or Assistance to Complete Evaluation  Min-Moderate modification of tasks or assist with assess necessary to complete eval    OT Frequency 2x / week    OT Duration 12 weeks    OT Treatment/Interventions Self-care/ADL training;Visual/perceptual remediation/compensation;DME and/or AE instruction;Patient/family education;Moist Heat;Therapeutic exercise;Manual Therapy;Therapeutic activities;Neuromuscular education    Consulted and Agree with Plan of Care Patient    Family Member Consulted wife, [name]           Patient will benefit from skilled therapeutic  intervention in order to improve the following deficits and impairments:   Body Structure / Function / Physical Skills: ADL, Dexterity, Flexibility, ROM, Strength, Vision, Balance, Coordination, FMC, IADL, Tone, Endurance, UE functional use, Mobility Cognitive Skills: Attention, Safety Awareness, Problem Solve Psychosocial Skills: Environmental  Adaptations, Routines and Behaviors, Habits, Interpersonal Interaction   Visit Diagnosis: Other lack of coordination  Neurologic neglect syndrome  Unsteadiness on feet  Muscle weakness (generalized)    Problem List Patient Active Problem List   Diagnosis Date Noted  . Sleep disturbance   . PAF (paroxysmal atrial fibrillation) ([place])   . Recurrent UTI   . Hypokalemia   . Hyponatremia   . Abnormal urine   . 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 Benign essential HTN   . Urinary retention   . History of gout   . Global aphasia   . SAH (subarachnoid hemorrhage) ([place])   . SDH (subdural hematoma) ([place])   . Lymphocytosis   . Subdural hemorrhage following injury ([place]) 05/12/2016   Oneida Mckamey T Mykaila Blunck, OTR/L, CLT  Polly Barner 06/10/2020, 9:08 PM  [place] MAIN [facility] SERVICES [address] [place], [place], [address] Phone: [phone]   Fax:  [phone]  Name: Rocko Fesperman MRN: [id number] Date of Birth: 02-03-1941

## 2020-06-13 ENCOUNTER — Encounter: Payer: Self-pay | Admitting: Occupational Therapy

## 2020-06-13 NOTE — Therapy (Signed)
[place] MAIN [facility] SERVICES [address] [place], [place], [address] Phone: [phone]   Fax:  [phone]  Occupational Therapy Treatment  Patient Details  Name: James [name] MRN: [id number] Date of Birth: November 03, 1940 Referring Provider (OT): Joselyn Arrow   Encounter Date: 06/10/2020   OT End of Session - 06/13/20 1238    Visit Number 46    Number of Visits 72    Date for OT Re-Evaluation 06/29/20    OT Start Time 1100    OT Stop Time 1145    OT Time Calculation (min) 45 min    Activity Tolerance Patient tolerated treatment well    Behavior During Therapy [facility] for tasks assessed/performed           Past Medical History:  Diagnosis Date  . Alcohol abuse, in remission   . Atrial fibrillation ([place])   . Bilateral renal masses   . Closed right ankle fracture   . 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 Gait disorder   . Gout   . Hypertension   . Seizure disorder ([place])   . 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     Past Surgical History:  Procedure Laterality Date  . CRANIECTOMY FOR DEPRESSED SKULL FRACTURE  2009   with MRSA infection  . CRANIOTOMY     X 2  . HERNIA REPAIR     during childhood  . ORIF ANKLE FRACTURE Left 2012    There were no vitals filed for this visit.   Subjective Assessment - 06/13/20 1231    Subjective  Patient smiling upon arrival and ready to get started.    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  He suffered another fall in 2017.  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     Patient Stated Goals Patient indicates he wants to be stronger, use his right hand for daily tasks, be as independent as possible.    Currently in Pain? No/denies    Multiple Pain Sites No            Patient seen for  ROM and strengthening with UBE from seated position, forwards/backwards, alternating directions and levels of resistance of 3.0 to 3.5 with therapist in constant attendance to ensure grip and adjust settings, 10 mins total  Manipulation skills with picking up small round marbles from small container using a tip to tip prehension pattern and placing marbles into larger container.  Occasional cues for using tip of index rather than side of finger. Manipulation of slightly larger items with use of ball pegs to pick up to place into grid, pt continues to demonstrate difficulty with turning items to place into grid and often requires other hand to turn item.    Handwriting this date with use of smaller lined paper for attempting name in script and print.   Patient has increased difficulty with smaller lined paper and trying to stay within the parameters of smaller lines.    Response to tx:  Patient performed well with manipulation of marbles today to reinforce tip to tip prehension patterns, occasional cues when he would attempt to switch to a lateral pinch for the task.  Continued difficulty with turning ball pegs to place into grid.  Handwriting remains impaired and patient has difficulty  with using smaller lined paper and staying within the lines to write his name.  Will continue to work towards this skill so he can print and sign name on papers in which name has to be contained in a specific box/frame/small space.                     OT Education - 06/13/20 1238    Education provided Yes    Education Details manipulation skills, functional hand exercises    Person(s) Educated Patient;Spouse    Methods Explanation;Demonstration;Verbal cues    Comprehension Verbalized understanding;Returned demonstration;Verbal cues required               OT Long Term Goals - 05/24/20 2021      OT LONG TERM GOAL #1   Title Patient will demonstrate use of utensil in right hand for self feeding  with modified independence with little to no spillage.     Baseline unable at eval to hold utensil, 10th visit holding utensil, some feeding. 20th visit, feeding self consistently but still has some moderate spillage    Time 12    Period Weeks    Status Achieved      OT LONG TERM GOAL #2   Title Pt will increase hand strength in right by 5# to be able to cut food with modified independence.    Baseline unable to cut meat at evaluation, 10th visit starting to work on cutting meat, patient reports difficulty with cutting meat    Time 12    Period Weeks    Status Achieved      OT LONG TERM GOAL #3   Title Patient will demonstrate increased awareness and strategies to attend to right side to hold items without dropping items.    Baseline difficulty with detecting items in right hand at eval, forgets items are in hand, 10th visit improved but still working on 12-30-2019: minimal dropping of items this date.  20th visit continued dropping of items occasionally but with increased awareness.  Update:  remains inconsistent but does well with repeated cues    Time 12    Period Weeks    Status On-going    Target Date 06/29/20      OT LONG TERM GOAL #4   Title Patient will demonstrate independence in home exercise program for ROM, strength and coordination of right hand.    Baseline eval-patient has not been performing exercises from last episode of care and demonstrates decline, 10th visit doing exercises at home and continue to modify program. 20th visit pt continues to engage in HEP with upgrades frequently    Time 12    Period Weeks    Status Partially Met    Target Date 06/29/20      OT LONG TERM GOAL #5   Title Pt to demonstrate improved balance with transitional movements with sit to stand from all surfaces with no loss of balance to reduce fall risk.    Time 12    Period Weeks    Status On-going    Target Date 06/29/20      OT LONG TERM GOAL #6   Title Patient will demonstrate pouring  a drink with the right hand with attention to right side and not knocking the cup over and spilling.      Baseline unable at eval, 10th visit starting to use right hand more and attempting to pour with right.  20th visit still has difficulty at times.  Time 12    Period Weeks    Status On-going    Target Date 06/29/20                 Plan - 06/13/20 1239    Clinical Impression Statement Patient performed well with manipulation of marbles today to reinforce tip to tip prehension patterns, occasional cues when he would attempt to switch to a lateral pinch for the task.  Continued difficulty with turning ball pegs to place into grid.  Handwriting remains impaired and patient has difficulty with using smaller lined paper and staying within the lines to write his name.  Will continue to work towards this skill so he can print and sign name on papers in which name has to be contained in a specific box/frame/small space.    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    Occupational performance deficits (Please refer to evaluation for details): ADL's;IADL's;Leisure    Body Structure / Function / Physical Skills ADL;Dexterity;Flexibility;ROM;Strength;Vision;Balance;Coordination;FMC;IADL;Tone;Endurance;UE functional use;Mobility    Cognitive Skills Attention;Safety Awareness;Problem Solve    Psychosocial Skills Environmental  Adaptations;Routines and Behaviors;Habits;Interpersonal Interaction    Rehab Potential Good    Clinical Decision Making Several treatment options, min-mod task modification necessary    Comorbidities Affecting Occupational Performance: May have  comorbidities impacting occupational performance    Modification or Assistance to Complete Evaluation  Min-Moderate modification of tasks or assist with assess necessary to complete eval    OT Frequency 2x / week    OT Duration 12 weeks    OT Treatment/Interventions Self-care/ADL training;Visual/perceptual remediation/compensation;DME and/or AE instruction;Patient/family education;Moist Heat;Therapeutic exercise;Manual Therapy;Therapeutic activities;Neuromuscular education    Consulted and Agree with Plan of Care Patient    Family Member Consulted wife, James [name]           Patient will benefit from skilled therapeutic intervention in order to improve the following deficits and impairments:   Body Structure / Function / Physical Skills: ADL, Dexterity, Flexibility, ROM, Strength, Vision, Balance, Coordination, FMC, IADL, Tone, Endurance, UE functional use, Mobility Cognitive Skills: Attention, Safety Awareness, Problem Solve Psychosocial Skills: Environmental  Adaptations, Routines and Behaviors, Habits, Interpersonal Interaction   Visit Diagnosis: Other lack of coordination  Neurologic neglect syndrome  Unsteadiness on feet  Muscle weakness (generalized)  Difficulty in walking, not elsewhere classified    Problem List Patient Active Problem List   Diagnosis Date Noted  . Sleep disturbance   . PAF (paroxysmal atrial fibrillation) ([place])   . Recurrent UTI   . Hypokalemia   . Hyponatremia   . Abnormal urine   . 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 Benign essential HTN   . Urinary retention   . History of gout   . Global aphasia   . SAH (subarachnoid hemorrhage) ([place])   . SDH (subdural hematoma) ([place])   . Lymphocytosis   . Subdural hemorrhage following injury ([place]) 05/12/2016   James [name], OTR/L, CLT  James [name] 06/13/2020, 1:13 PM  [place] MAIN [facility] SERVICES [address] [place], [place], [address] Phone:  [phone]   Fax:  [phone]  Name: James [name] MRN: [id number] Date of Birth: 1941/02/06

## 2020-06-15 ENCOUNTER — Other Ambulatory Visit: Payer: Self-pay

## 2020-06-15 ENCOUNTER — Ambulatory Visit: Payer: PPO | Admitting: Occupational Therapy

## 2020-06-15 DIAGNOSIS — M6281 Muscle weakness (generalized): Secondary | ICD-10-CM

## 2020-06-15 DIAGNOSIS — R278 Other lack of coordination: Secondary | ICD-10-CM

## 2020-06-15 DIAGNOSIS — R262 Difficulty in walking, not elsewhere classified: Secondary | ICD-10-CM

## 2020-06-15 DIAGNOSIS — R2681 Unsteadiness on feet: Secondary | ICD-10-CM

## 2020-06-15 DIAGNOSIS — R414 Neurologic neglect syndrome: Secondary | ICD-10-CM

## 2020-06-17 ENCOUNTER — Other Ambulatory Visit: Payer: Self-pay

## 2020-06-17 ENCOUNTER — Encounter: Payer: Self-pay | Admitting: Occupational Therapy

## 2020-06-17 ENCOUNTER — Ambulatory Visit: Payer: PPO | Admitting: Occupational Therapy

## 2020-06-17 DIAGNOSIS — R2681 Unsteadiness on feet: Secondary | ICD-10-CM

## 2020-06-17 DIAGNOSIS — R262 Difficulty in walking, not elsewhere classified: Secondary | ICD-10-CM

## 2020-06-17 DIAGNOSIS — R414 Neurologic neglect syndrome: Secondary | ICD-10-CM

## 2020-06-17 DIAGNOSIS — M6281 Muscle weakness (generalized): Secondary | ICD-10-CM

## 2020-06-17 DIAGNOSIS — R278 Other lack of coordination: Secondary | ICD-10-CM

## 2020-06-17 NOTE — Therapy (Signed)
[place] MAIN [facility] SERVICES [address] [place], [place], [address] Phone: [phone]   Fax:  [phone]  Occupational Therapy Treatment  Patient Details  Name: James [name] MRN: [id number] Date of Birth: 1941/05/05 Referring Provider (OT): Joselyn Arrow   Encounter Date: 06/15/2020   OT End of Session - 06/16/20 0915    Visit Number 27    Number of Visits 72    Date for OT Re-Evaluation 06/29/20    OT Start Time 1050    OT Stop Time 1139    OT Time Calculation (min) 49 min    Activity Tolerance Patient tolerated treatment well    Behavior During Therapy [facility] for tasks assessed/performed           Past Medical History:  Diagnosis Date  . Alcohol abuse, in remission   . Atrial fibrillation ([place])   . Bilateral renal masses   . Closed right ankle fracture   . 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 Gait disorder   . Gout   . Hypertension   . Seizure disorder ([place])   . 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     Past Surgical History:  Procedure Laterality Date  . CRANIECTOMY FOR DEPRESSED SKULL FRACTURE  2009   with MRSA infection  . CRANIOTOMY     X 2  . HERNIA REPAIR     during childhood  . ORIF ANKLE FRACTURE Left 2012    There were no vitals filed for this visit.   Subjective Assessment - 06/16/20 0914    Subjective  Patient and wife reporting they will get together with friends for Thanksgiving this week.  No complaints today, no pain.    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  He suffered another fall in 2017.  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     Patient Stated Goals Patient indicates he wants to be stronger, use his right hand for daily tasks, be as independent as possible.    Currently in Pain?  No/denies    Multiple Pain Sites No           Patient seen this date for resistive UE ROM and strengthening with RUE, reciprocal arm patterns with resistance, various directions and planes of motion.  Thumb strengthening for ABD, palmar ABD with mild resistance.    Neuro: Patient seen for manipulation of small objects, coins from tabletop to pick up and place into resistive bank with right hand, cues for prehension patterns and not to always slide objects to the edge.  Manipulation of cards with turning and flipping utilizing thumb and finger combinations with cues and therapist demo.  Patient has most difficulty with alternating patterns flipping towards and away with cards.    Response to tx: Patient indicating improved ROM at the thumb, he has arthritic changes and we discussed he will always have issues in thumb [facility] joint however, he has improved with motion and strength of the thumb and is working to impact coordination skills.  Patient demonstrates improvement in modulation of grasp in right hand and able to adjust and correct gripping patterns based on the type of tool he is using.  Continue to work towards goals in plan of care to maximize safety and independence in necessary daily  tasks.                      OT Education - 06/17/20 0915    Education provided Yes    Education Details manipulation skills, functional hand exercises    Person(s) Educated Patient;Spouse    Methods Explanation;Demonstration;Verbal cues    Comprehension Verbalized understanding;Returned demonstration;Verbal cues required               OT Long Term Goals - 05/24/20 2021      OT LONG TERM GOAL #1   Title Patient will demonstrate use of utensil in right hand for self feeding with modified independence with little to no spillage.     Baseline unable at eval to hold utensil, 10th visit holding utensil, some feeding. 20th visit, feeding self consistently but still has some moderate  spillage    Time 12    Period Weeks    Status Achieved      OT LONG TERM GOAL #2   Title Pt will increase hand strength in right by 5# to be able to cut food with modified independence.    Baseline unable to cut meat at evaluation, 10th visit starting to work on cutting meat, patient reports difficulty with cutting meat    Time 12    Period Weeks    Status Achieved      OT LONG TERM GOAL #3   Title Patient will demonstrate increased awareness and strategies to attend to right side to hold items without dropping items.    Baseline difficulty with detecting items in right hand at eval, forgets items are in hand, 10th visit improved but still working on 12-30-2019: minimal dropping of items this date.  20th visit continued dropping of items occasionally but with increased awareness.  Update:  remains inconsistent but does well with repeated cues    Time 12    Period Weeks    Status On-going    Target Date 06/29/20      OT LONG TERM GOAL #4   Title Patient will demonstrate independence in home exercise program for ROM, strength and coordination of right hand.    Baseline eval-patient has not been performing exercises from last episode of care and demonstrates decline, 10th visit doing exercises at home and continue to modify program. 20th visit pt continues to engage in HEP with upgrades frequently    Time 12    Period Weeks    Status Partially Met    Target Date 06/29/20      OT LONG TERM GOAL #5   Title Pt to demonstrate improved balance with transitional movements with sit to stand from all surfaces with no loss of balance to reduce fall risk.    Time 12    Period Weeks    Status On-going    Target Date 06/29/20      OT LONG TERM GOAL #6   Title Patient will demonstrate pouring a drink with the right hand with attention to right side and not knocking the cup over and spilling.      Baseline unable at eval, 10th visit starting to use right hand more and attempting to pour with right.   20th visit still has difficulty at times.    Time 12    Period Weeks    Status On-going    Target Date 06/29/20                 Plan - 06/16/20 0915  Clinical Impression Statement Patient indicating improved ROM at the thumb, he has arthritic changes and we discussed he will always have issues in thumb [facility] joint however, he has improved with motion and strength of the thumb and is working to impact coordination skills.  Patient demonstrates improvement in modulation of grasp in right hand and able to adjust and correct gripping patterns based on the type of tool he is using.  Continue to work towards goals in plan of care to maximize safety and independence in necessary daily tasks.    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    Occupational performance deficits (Please refer to evaluation for details): ADL's;IADL's;Leisure    Body Structure / Function / Physical Skills ADL;Dexterity;Flexibility;ROM;Strength;Vision;Balance;Coordination;FMC;IADL;Tone;Endurance;UE functional use;Mobility    Cognitive Skills Attention;Safety Awareness;Problem Solve    Psychosocial Skills Environmental  Adaptations;Routines and Behaviors;Habits;Interpersonal Interaction    Rehab Potential Good    Clinical Decision Making Several treatment options, min-mod task modification necessary    Comorbidities Affecting Occupational Performance: May have comorbidities impacting occupational performance    Modification or Assistance to Complete Evaluation  Min-Moderate modification of tasks or assist with assess necessary to complete eval    OT Frequency 2x / week    OT Duration 12  weeks    OT Treatment/Interventions Self-care/ADL training;Visual/perceptual remediation/compensation;DME and/or AE instruction;Patient/family education;Moist Heat;Therapeutic exercise;Manual Therapy;Therapeutic activities;Neuromuscular education    Consulted and Agree with Plan of Care Patient    Family Member Consulted wife, [name]           Patient will benefit from skilled therapeutic intervention in order to improve the following deficits and impairments:   Body Structure / Function / Physical Skills: ADL, Dexterity, Flexibility, ROM, Strength, Vision, Balance, Coordination, FMC, IADL, Tone, Endurance, UE functional use, Mobility Cognitive Skills: Attention, Safety Awareness, Problem Solve Psychosocial Skills: Environmental  Adaptations, Routines and Behaviors, Habits, Interpersonal Interaction   Visit Diagnosis: Unsteadiness on feet  Muscle weakness (generalized)  Neurologic neglect syndrome  Other lack of coordination  Difficulty in walking, not elsewhere classified    Problem List Patient Active Problem List   Diagnosis Date Noted  . Sleep disturbance   . PAF (paroxysmal atrial fibrillation) ([place])   . Recurrent UTI   . Hypokalemia   . Hyponatremia   . Abnormal urine   . 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 Benign essential HTN   . Urinary retention   . History of gout   . Global aphasia   . SAH (subarachnoid hemorrhage) ([place])   . SDH (subdural hematoma) ([place])   . Lymphocytosis   . Subdural hemorrhage following injury ([place]) 05/12/2016   Drae Mitzel T Keah Lamba, OTR/L, CLT  Hawa Henly 06/17/2020, 10:10 AM  [place] MAIN [facility] SERVICES [place], [place], [address] Phone: [phone]   Fax:  [phone]  Name: James [name] MRN: [id number] Date of Birth: September 03, 1940

## 2020-06-17 NOTE — Therapy (Signed)
[place] MAIN [facility] SERVICES [address] [place], [place], [address] Phone: [phone]   Fax:  [phone]  Occupational Therapy Treatment  Patient Details  Name: James [name] MRN: [id number] Date of Birth: Jul 27, 1940 Referring Provider (OT): Joselyn Arrow   Encounter Date: 06/17/2020   OT End of Session - 06/17/20 7096    Visit Number 52    Number of Visits 72    Date for OT Re-Evaluation 06/29/20    OT Start Time 1045    OT Stop Time 1135    OT Time Calculation (min) 50 min    Activity Tolerance Patient tolerated treatment well    Behavior During Therapy [facility] for tasks assessed/performed           Past Medical History:  Diagnosis Date  . Alcohol abuse, in remission   . Atrial fibrillation ([place])   . Bilateral renal masses   . Closed right ankle fracture   . 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 Gait disorder   . Gout   . Hypertension   . Seizure disorder ([place])   . 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     Past Surgical History:  Procedure Laterality Date  . CRANIECTOMY FOR DEPRESSED SKULL FRACTURE  2009   with MRSA infection  . CRANIOTOMY     X 2  . HERNIA REPAIR     during childhood  . ORIF ANKLE FRACTURE Left 2012    There were no vitals filed for this visit.   Subjective Assessment - 06/17/20 1110    Subjective  Pt arrived early, indicating he would like to work on his right hand.  Reports he is ready for Thanksgiving tomorrow.    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  He suffered another fall in 2017.  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     Patient Stated Goals Patient indicates he wants to be stronger, use his right hand for daily tasks, be as independent as possible.    Currently in Pain? No/denies     Multiple Pain Sites No           Patient seen for focus on right hand thumb ROM, therapist performing PROM stretch to right thumb followed by AROM for thumb flexion/extension, ABD/ADD, palmar ABD/ADD some guiding at MCP when going thru the motions.  Attempts at tapping with thumb on tabletop.  IP flexion/extension cues and guiding as needed.   Resistive clothespins (regular wooden pins this date) with cues for technique.  Instructed on tip to tip pinch pattern rather than a lateral pinch for task.    Neuro: Patient seen for use of push pins with right hand to pick up and place into bulletin board with cues for tip to tip prehension pattern, patient tends to then pronate at the forearm which occludes his vision of the pin and has difficulty with placing.  Cues and guiding to keep hand slightly supinated when attempting to place pins.  More difficulty placing pins than removing them at the end.  Continued cues for prehension pattern when removing as well.   Use of tweezers in right hand this date to move items from grid, cues for grasping pattern and positioning of forearm/wrist for task.    Response to tx:  Patient reports frustration at times with right thumb ROM and with manipulation of small objects despite his progress.  Patient does tend to pronate forearm when attempting to place items in a grid or specific location which occludes his vision of the item and then he has no feedback to know if he is placing it correctly or not.  We worked on positioning of hand, forearm and wrist for optimal results and will need continued reinforcement to reestablish patterns.  Continue OT towards goals in plan of care to facilitate greater independence with daily tasks.                           OT Long Term Goals - 05/24/20 2021      OT LONG TERM GOAL #1   Title Patient will demonstrate use of utensil in right hand for self feeding with modified independence with little to no  spillage.     Baseline unable at eval to hold utensil, 10th visit holding utensil, some feeding. 20th visit, feeding self consistently but still has some moderate spillage    Time 12    Period Weeks    Status Achieved      OT LONG TERM GOAL #2   Title Pt will increase hand strength in right by 5# to be able to cut food with modified independence.    Baseline unable to cut meat at evaluation, 10th visit starting to work on cutting meat, patient reports difficulty with cutting meat    Time 12    Period Weeks    Status Achieved      OT LONG TERM GOAL #3   Title Patient will demonstrate increased awareness and strategies to attend to right side to hold items without dropping items.    Baseline difficulty with detecting items in right hand at eval, forgets items are in hand, 10th visit improved but still working on 12-30-2019: minimal dropping of items this date.  20th visit continued dropping of items occasionally but with increased awareness.  Update:  remains inconsistent but does well with repeated cues    Time 12    Period Weeks    Status On-going    Target Date 06/29/20      OT LONG TERM GOAL #4   Title Patient will demonstrate independence in home exercise program for ROM, strength and coordination of right hand.    Baseline eval-patient has not been performing exercises from last episode of care and demonstrates decline, 10th visit doing exercises at home and continue to modify program. 20th visit pt continues to engage in HEP with upgrades frequently    Time 12    Period Weeks    Status Partially Met    Target Date 06/29/20      OT LONG TERM GOAL #5   Title Pt to demonstrate improved balance with transitional movements with sit to stand from all surfaces with no loss of balance to reduce fall risk.    Time 12    Period Weeks    Status On-going    Target Date 06/29/20      OT LONG TERM GOAL #6   Title Patient will demonstrate pouring a drink with the right hand with attention to  right side and not knocking the cup over and spilling.      Baseline unable at eval, 10th visit starting to use right hand more and attempting to pour with right.  20th visit still has difficulty at times.  Time 12    Period Weeks    Status On-going    Target Date 06/29/20                 Plan - 06/17/20 1250    Clinical Impression Statement Patient reports frustration at times with right thumb ROM and with manipulation of small objects despite his progress.  Patient does tend to pronate forearm when attempting to place items in a grid or specific location which occludes his vision of the item and then he has no feedback to know if he is placing it correctly or not.  We worked on positioning of hand, forearm and wrist for optimal results and will need continued reinforcement to reestablish patterns.  Continue OT towards goals in plan of care to facilitate greater independence with daily tasks.    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    Occupational performance deficits (Please refer to evaluation for details): ADL's;IADL's;Leisure    Body Structure / Function / Physical Skills ADL;Dexterity;Flexibility;ROM;Strength;Vision;Balance;Coordination;FMC;IADL;Tone;Endurance;UE functional use;Mobility    Cognitive Skills Attention;Safety Awareness;Problem Solve    Psychosocial Skills Environmental  Adaptations;Routines and Behaviors;Habits;Interpersonal Interaction    Rehab Potential Good    Clinical Decision Making Several treatment options, min-mod task modification necessary    Comorbidities Affecting Occupational Performance: May have comorbidities  impacting occupational performance    Modification or Assistance to Complete Evaluation  Min-Moderate modification of tasks or assist with assess necessary to complete eval    OT Frequency 2x / week    OT Duration 12 weeks    OT Treatment/Interventions Self-care/ADL training;Visual/perceptual remediation/compensation;DME and/or AE instruction;Patient/family education;Moist Heat;Therapeutic exercise;Manual Therapy;Therapeutic activities;Neuromuscular education    Consulted and Agree with Plan of Care Patient    Family Member Consulted wife, [name]           Patient will benefit from skilled therapeutic intervention in order to improve the following deficits and impairments:   Body Structure / Function / Physical Skills: ADL, Dexterity, Flexibility, ROM, Strength, Vision, Balance, Coordination, FMC, IADL, Tone, Endurance, UE functional use, Mobility Cognitive Skills: Attention, Safety Awareness, Problem Solve Psychosocial Skills: Environmental  Adaptations, Routines and Behaviors, Habits, Interpersonal Interaction   Visit Diagnosis: Muscle weakness (generalized)  Unsteadiness on feet  Other lack of coordination  Neurologic neglect syndrome  Difficulty in walking, not elsewhere classified    Problem List Patient Active Problem List   Diagnosis Date Noted  . Sleep disturbance   . PAF (paroxysmal atrial fibrillation) ([place])   . Recurrent UTI   . Hypokalemia   . Hyponatremia   . Abnormal urine   . 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 Benign essential HTN   . Urinary retention   . History of gout   . Global aphasia   . SAH (subarachnoid hemorrhage) ([place])   . SDH (subdural hematoma) ([place])   . Lymphocytosis   . Subdural hemorrhage following injury ([place]) 05/12/2016   Aniko Finnigan T Mele Sylvester, OTR/L, CLT  Reed Eifert 06/17/2020, 1:51 PM  [place] MAIN [facility] SERVICES [address] [place], [place], [address] Phone: [phone]    Fax:  [phone]  Name: Deven Furia MRN: [id number] Date of Birth: 11-13-1940

## 2020-06-22 ENCOUNTER — Ambulatory Visit: Payer: PPO | Admitting: Occupational Therapy

## 2020-06-23 DIAGNOSIS — I10 Essential (primary) hypertension: Secondary | ICD-10-CM | POA: Diagnosis not present

## 2020-06-23 DIAGNOSIS — D696 Thrombocytopenia, unspecified: Secondary | ICD-10-CM | POA: Diagnosis not present

## 2020-06-23 DIAGNOSIS — E538 Deficiency of other specified B group vitamins: Secondary | ICD-10-CM | POA: Diagnosis not present

## 2020-06-23 DIAGNOSIS — E7849 Other hyperlipidemia: Secondary | ICD-10-CM | POA: Diagnosis not present

## 2020-06-23 DIAGNOSIS — M1A09X0 Idiopathic chronic gout, multiple sites, without tophus (tophi): Secondary | ICD-10-CM | POA: Diagnosis not present

## 2020-06-24 ENCOUNTER — Other Ambulatory Visit: Payer: Self-pay

## 2020-06-24 ENCOUNTER — Ambulatory Visit: Payer: PPO | Attending: Neurology | Admitting: Occupational Therapy

## 2020-06-24 DIAGNOSIS — M6281 Muscle weakness (generalized): Secondary | ICD-10-CM | POA: Diagnosis not present

## 2020-06-24 DIAGNOSIS — R414 Neurologic neglect syndrome: Secondary | ICD-10-CM | POA: Diagnosis not present

## 2020-06-24 DIAGNOSIS — R2681 Unsteadiness on feet: Secondary | ICD-10-CM | POA: Diagnosis not present

## 2020-06-24 DIAGNOSIS — R262 Difficulty in walking, not elsewhere classified: Secondary | ICD-10-CM | POA: Insufficient documentation

## 2020-06-24 DIAGNOSIS — R278 Other lack of coordination: Secondary | ICD-10-CM | POA: Diagnosis not present

## 2020-06-27 ENCOUNTER — Encounter: Payer: Self-pay | Admitting: Occupational Therapy

## 2020-06-27 NOTE — Therapy (Signed)
[place] MAIN [facility] SERVICES [address] [place], [place], [address] Phone: [phone]   Fax:  [phone]  Occupational Therapy Treatment  Patient Details  Name: James [name] MRN: [id number] Date of Birth: 05-03-1941 Referring Provider (OT): Joselyn Arrow   Encounter Date: 06/24/2020   OT End of Session - 06/27/20 1415    Visit Number 3    Number of Visits 72    Date for OT Re-Evaluation 06/29/20    OT Start Time 1100    OT Stop Time 1146    OT Time Calculation (min) 46 min    Activity Tolerance Patient tolerated treatment well    Behavior During Therapy [facility] for tasks assessed/performed           Past Medical History:  Diagnosis Date  . Alcohol abuse, in remission   . Atrial fibrillation ([place])   . Bilateral renal masses   . Closed right ankle fracture   . 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 Gait disorder   . Gout   . Hypertension   . Seizure disorder ([place])   . 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     Past Surgical History:  Procedure Laterality Date  . CRANIECTOMY FOR DEPRESSED SKULL FRACTURE  2009   with MRSA infection  . CRANIOTOMY     X 2  . HERNIA REPAIR     during childhood  . ORIF ANKLE FRACTURE Left 2012    There were no vitals filed for this visit.   Subjective Assessment - 06/27/20 1414    Subjective  Patient and wife report they had a good Thanksgiving holiday, patient indicating he did exercises over the weekend.    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  He suffered another fall in 2017.  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     Patient Stated Goals Patient indicates he wants to be stronger, use his right hand for daily tasks, be as independent as possible.    Currently in Pain? No/denies     Multiple Pain Sites No            Neuromuscular reeducation:  Patient seen for manipulation of [place] discs from seated position with right hand to pick up from case and place each piece into grid with black side facing upwards.  Patient then working on turning/flipping items to opposite side to red colored side with right hand, cues for isolated finger movements and manipulation skills.  Therapist demonstration also provided.  Patient then worked on stacking up to 6 items at a time with right hand working towards precision movements with cues.    Manipulation of small wooden Toothpicks to pick up from tabletop and placing into small holed container with right hand.  Difficulty at times with turning object to place into container and with picking up directly from table.   Response to tx: Pt responded well to therapist demonstration and cues for turning and flipping of [place] discs, pt has difficulty with isolation of index finger to turn and flip.  Often has to pull toothpicks towards edge of table to pick up.  Difficulty with turning some of picks to place into small holed container however responded well to therapist demo and cues.  Continue to work towards goals to  increase independence in daily tasks.  Will plan to perform reassessment next session with goals updated.                     OT Education - 06/27/20 1415    Education provided Yes    Education Details manipulation skills, functional hand exercises    Person(s) Educated Patient;Spouse    Methods Explanation;Demonstration;Verbal cues    Comprehension Verbalized understanding;Returned demonstration;Verbal cues required               OT Long Term Goals - 05/24/20 2021      OT LONG TERM GOAL #1   Title Patient will demonstrate use of utensil in right hand for self feeding with modified independence with little to no spillage.     Baseline unable at eval to hold utensil, 10th visit holding utensil,  some feeding. 20th visit, feeding self consistently but still has some moderate spillage    Time 12    Period Weeks    Status Achieved      OT LONG TERM GOAL #2   Title Pt will increase hand strength in right by 5# to be able to cut food with modified independence.    Baseline unable to cut meat at evaluation, 10th visit starting to work on cutting meat, patient reports difficulty with cutting meat    Time 12    Period Weeks    Status Achieved      OT LONG TERM GOAL #3   Title Patient will demonstrate increased awareness and strategies to attend to right side to hold items without dropping items.    Baseline difficulty with detecting items in right hand at eval, forgets items are in hand, 10th visit improved but still working on 12-30-2019: minimal dropping of items this date.  20th visit continued dropping of items occasionally but with increased awareness.  Update:  remains inconsistent but does well with repeated cues    Time 12    Period Weeks    Status On-going    Target Date 06/29/20      OT LONG TERM GOAL #4   Title Patient will demonstrate independence in home exercise program for ROM, strength and coordination of right hand.    Baseline eval-patient has not been performing exercises from last episode of care and demonstrates decline, 10th visit doing exercises at home and continue to modify program. 20th visit pt continues to engage in HEP with upgrades frequently    Time 12    Period Weeks    Status Partially Met    Target Date 06/29/20      OT LONG TERM GOAL #5   Title Pt to demonstrate improved balance with transitional movements with sit to stand from all surfaces with no loss of balance to reduce fall risk.    Time 12    Period Weeks    Status On-going    Target Date 06/29/20      OT LONG TERM GOAL #6   Title Patient will demonstrate pouring a drink with the right hand with attention to right side and not knocking the cup over and spilling.      Baseline unable at  eval, 10th visit starting to use right hand more and attempting to pour with right.  20th visit still has difficulty at times.    Time 12    Period Weeks    Status On-going    Target Date 06/29/20  Plan - 06/27/20 1415    Clinical Impression Statement Pt responded well to therapist demonstration and cues for turning and flipping of [place] discs, pt has difficulty with isolation of index finger to turn and flip.  Often has to pull toothpicks towards edge of table to pick up.  Difficulty with turning some of picks to place into small holed container however responded well to therapist demo and cues.  Continue to work towards goals to increase independence in daily tasks.  Will plan to perform reassessment next session with goals updated.    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    Occupational performance deficits (Please refer to evaluation for details): ADL's;IADL's;Leisure    Body Structure / Function / Physical Skills ADL;Dexterity;Flexibility;ROM;Strength;Vision;Balance;Coordination;FMC;IADL;Tone;Endurance;UE functional use;Mobility    Cognitive Skills Attention;Safety Awareness;Problem Solve    Psychosocial Skills Environmental  Adaptations;Routines and Behaviors;Habits;Interpersonal Interaction    Rehab Potential Good    Clinical Decision Making Several treatment options, min-mod task modification necessary    Comorbidities Affecting Occupational Performance: May have comorbidities impacting occupational performance    Modification or Assistance to Complete Evaluation  Min-Moderate modification of tasks or assist with assess  necessary to complete eval    OT Frequency 2x / week    OT Duration 12 weeks    OT Treatment/Interventions Self-care/ADL training;Visual/perceptual remediation/compensation;DME and/or AE instruction;Patient/family education;Moist Heat;Therapeutic exercise;Manual Therapy;Therapeutic activities;Neuromuscular education    Consulted and Agree with Plan of Care Patient    Family Member Consulted wife, [name]           Patient will benefit from skilled therapeutic intervention in order to improve the following deficits and impairments:   Body Structure / Function / Physical Skills: ADL, Dexterity, Flexibility, ROM, Strength, Vision, Balance, Coordination, FMC, IADL, Tone, Endurance, UE functional use, Mobility Cognitive Skills: Attention, Safety Awareness, Problem Solve Psychosocial Skills: Environmental  Adaptations, Routines and Behaviors, Habits, Interpersonal Interaction   Visit Diagnosis: Muscle weakness (generalized)  Other lack of coordination  Neurologic neglect syndrome  Unsteadiness on feet  Difficulty in walking, not elsewhere classified    Problem List Patient Active Problem List   Diagnosis Date Noted  . Sleep disturbance   . PAF (paroxysmal atrial fibrillation) ([place])   . Recurrent UTI   . Hypokalemia   . Hyponatremia   . Abnormal urine   . 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 Benign essential HTN   . Urinary retention   . History of gout   . Global aphasia   . SAH (subarachnoid hemorrhage) ([place])   . SDH (subdural hematoma) ([place])   . Lymphocytosis   . Subdural hemorrhage following injury ([place]) 05/12/2016   Hinda Lindor T Shavaun Osterloh, OTR/L, CLT  Adaysha Dubinsky 06/27/2020, 2:28 PM  [place] MAIN [facility] SERVICES [address] [place], [place], [address] Phone: [phone]   Fax:  [phone]  Name: James [name] MRN: [id number] Date of Birth: 10/13/1940

## 2020-06-29 ENCOUNTER — Other Ambulatory Visit: Payer: Self-pay

## 2020-06-29 ENCOUNTER — Ambulatory Visit: Payer: PPO | Admitting: Occupational Therapy

## 2020-06-29 DIAGNOSIS — M6281 Muscle weakness (generalized): Secondary | ICD-10-CM | POA: Diagnosis not present

## 2020-06-29 DIAGNOSIS — R2681 Unsteadiness on feet: Secondary | ICD-10-CM

## 2020-06-29 DIAGNOSIS — R414 Neurologic neglect syndrome: Secondary | ICD-10-CM

## 2020-06-29 DIAGNOSIS — R262 Difficulty in walking, not elsewhere classified: Secondary | ICD-10-CM

## 2020-06-29 DIAGNOSIS — R278 Other lack of coordination: Secondary | ICD-10-CM

## 2020-06-30 DIAGNOSIS — I10 Essential (primary) hypertension: Secondary | ICD-10-CM | POA: Diagnosis not present

## 2020-06-30 DIAGNOSIS — F3342 Major depressive disorder, recurrent, in full remission: Secondary | ICD-10-CM | POA: Diagnosis not present

## 2020-06-30 DIAGNOSIS — K219 Gastro-esophageal reflux disease without esophagitis: Secondary | ICD-10-CM | POA: Diagnosis not present

## 2020-06-30 DIAGNOSIS — E7849 Other hyperlipidemia: Secondary | ICD-10-CM | POA: Diagnosis not present

## 2020-06-30 DIAGNOSIS — M1A09X0 Idiopathic chronic gout, multiple sites, without tophus (tophi): Secondary | ICD-10-CM | POA: Diagnosis not present

## 2020-06-30 DIAGNOSIS — I2510 Atherosclerotic heart disease of native coronary artery without angina pectoris: Secondary | ICD-10-CM | POA: Diagnosis not present

## 2020-06-30 DIAGNOSIS — Z8679 Personal history of other diseases of the circulatory system: Secondary | ICD-10-CM | POA: Diagnosis not present

## 2020-06-30 DIAGNOSIS — Z0000 Encounter for general adult medical examination without abnormal findings: Secondary | ICD-10-CM | POA: Diagnosis not present

## 2020-06-30 DIAGNOSIS — I4891 Unspecified atrial fibrillation: Secondary | ICD-10-CM | POA: Diagnosis not present

## 2020-06-30 DIAGNOSIS — G40209 Localization-related (focal) (partial) symptomatic epilepsy and epileptic syndromes with complex partial seizures, not intractable, without status epilepticus: Secondary | ICD-10-CM | POA: Diagnosis not present

## 2020-06-30 DIAGNOSIS — E538 Deficiency of other specified B group vitamins: Secondary | ICD-10-CM | POA: Diagnosis not present

## 2020-06-30 DIAGNOSIS — D696 Thrombocytopenia, unspecified: Secondary | ICD-10-CM | POA: Diagnosis not present

## 2020-07-01 ENCOUNTER — Ambulatory Visit: Payer: PPO | Admitting: Occupational Therapy

## 2020-07-01 NOTE — Therapy (Signed)
[place] MAIN [facility] SERVICES [address] [place], [place], [address] Phone: [phone]   Fax:  [phone]  Occupational Therapy Treatment/Recertification/Progress Update Reporting period for Progress update from 05/18/2020 to 06/29/2020  Patient Details  Name: James [name] MRN: [id number] Date of Birth: 1941-06-29 Referring Provider (OT): Joselyn Arrow   Encounter Date: 06/29/2020   OT End of Session - 07/01/20 1453    Visit Number 60    Number of Visits 64    Date for OT Re-Evaluation 09/21/20    OT Start Time 1101    OT Stop Time 1150    OT Time Calculation (min) 49 min    Activity Tolerance Patient tolerated treatment well    Behavior During Therapy [facility] for tasks assessed/performed           Past Medical History:  Diagnosis Date  . Alcohol abuse, in remission   . Atrial fibrillation ([place])   . Bilateral renal masses   . Closed right ankle fracture   . 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 Gait disorder   . Gout   . Hypertension   . Seizure disorder ([place])   . 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     Past Surgical History:  Procedure Laterality Date  . CRANIECTOMY FOR DEPRESSED SKULL FRACTURE  2009   with MRSA infection  . CRANIOTOMY     X 2  . HERNIA REPAIR     during childhood  . ORIF ANKLE FRACTURE Left 2012    There were no vitals filed for this visit.   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  He suffered another fall in 2017.  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     Patient Stated Goals Patient indicates he wants to be stronger, use his  right hand for daily tasks, be as independent as possible.    Currently in Pain? No/denies    Multiple Pain Sites No              [facility] OT Assessment - 07/01/20 1456      Assessment   Medical Diagnosis CVA/right UE weakness    Referring Provider (OT) Manuella Ghazi, H    Onset Date/Surgical Date 02/21/18    Hand Dominance Right      Observation/Other Assessments   Focus on Therapeutic Outcomes (FOTO)  58      Coordination   Left 9 Hole Peg Test 30      Hand Function   Right Hand Grip (lbs) 60    Right Hand Lateral Pinch 17 lbs    Right Hand 3 Point Pinch 19 lbs    Left Hand Grip (lbs) 62    Left Hand Lateral Pinch 19 lbs    Left 3 point pinch 20 lbs          Patient was seen this date for reassessment of hand skills as per flow sheet noted above, please see for details. Administered FOTO with score of 58.    ADL reassessment with patient and confirmed details with wife for tasks at home and in the  community. Patient has been able to dress himself with modified independence at home, increased time allowed to complete buttons snaps and zippers.  Patient is able to self-feed and is now using regular utensils or requires built-up handle.  He has been able to attend social situations for self-feeding appropriately in the community and with visitors at home.  Patient has continued to work on Engineer, production with emphasis on secure tripod grasp using the right hand and with letter formation.  Legibility remains poor at times but has improved in recent months.  Patient tends to write in script the majority of the time with large letter strokes and has difficulty with containing words to small spaces or small lines.  He is continuing to work on this skill to be able to fill out important papers and forms as needed with his demographic information.  Patient is able to demonstrate greater modulation of grasping patterns with items in the clinic and at home.  He does still drop items at times.   He can pour himself a drink now as long as he is visually attending to task, demonstrating minimal to no spillage.  Balance remains impaired especially for initiation of transitional movement patterns and wife reports patient is impulsive with sit to stand and with initiating functional mobility.  Patient remains at risk for falls and has had a couple of falls in recent months.       Response to tx: Patient continues to progress in all areas, strength and right hand for gri has improved over the last certification period. Patient continues to demonstrate improvements with modulation of graded pressure with right hand when grasping and releasing objects. Patient no longer crushes items held in his right hand the majority of the time. He does continue to occasionally forget that items are in his hand secondary to decreased sensation and proprioception and may drop items. He has improved with self-feeding skills, with minimal spillage and is able to participate in social situations with friends without requiring assistance to self feed. Patient no longer requires red foam buildup handle for grasping utensils. Patient continues to require increased time to manage buttons but is now also able to manipulate small buttons at his collar prior to putting a shirt on. Patient continues to demonstrate difficulty with manipulation skills of the right hand with turning flipping or changing direction of objects while in hand. Difficulty this day with nine hole peg test with right hand and became frustrated which was a barrier to completing the test today. Goals were reviewed and updated to reflect progress. Patient continues to benefit from skilled occupational therapy intervention to maximize safety and independence in ADLs and IADLs.            OT Education - 07/01/20 1452    Education provided Yes    Education Details manipulation skills, functional hand exercises, goals, update    Person(s) Educated  Patient;Spouse    Methods Explanation;Demonstration;Verbal cues    Comprehension Verbalized understanding;Returned demonstration;Verbal cues required               OT Long Term Goals - 07/02/20 0941      OT LONG TERM GOAL #1   Title Patient will demonstrate use of utensil in right hand for self feeding with modified independence with little to no spillage.     Baseline unable at eval to hold utensil, 10th visit holding utensil, some feeding. 20th visit, feeding self consistently but still has some moderate spillage    Time  12    Period Weeks    Status Achieved    Target Date 09/21/20      OT LONG TERM GOAL #2   Title Pt will increase hand strength in right by 5# to be able to cut food with modified independence.    Baseline unable to cut meat at evaluation, 10th visit starting to work on cutting meat, patient reports difficulty with cutting meat. 12/6 still has difficulty with cutting meat unless it does not require a knife    Time 12    Period Weeks    Status On-going    Target Date 09/21/20      OT LONG TERM GOAL #3   Title Patient will demonstrate increased awareness and strategies to attend to right side to hold items without dropping items.    Baseline difficulty with detecting items in right hand at eval, forgets items are in hand, 10th visit improved but still working on 12-30-2019: minimal dropping of items this date.  20th visit continued dropping of items occasionally but with increased awareness.  Update:  remains inconsistent but does well with repeated cues    Time 12    Period Weeks    Status On-going    Target Date 09/21/20      OT LONG TERM GOAL #4   Title Patient will demonstrate independence in home exercise program for ROM, strength and coordination of right hand.    Baseline eval-patient has not been performing exercises from last episode of care and demonstrates decline, 10th visit doing exercises at home and continue to modify program. 20th visit pt  continues to engage in HEP with upgrades frequently    Time 12    Period Weeks    Status Partially Met    Target Date 09/21/20      OT LONG TERM GOAL #5   Title Pt to demonstrate improved balance with transitional movements with sit to stand from all surfaces with no loss of balance to reduce fall risk. 12/6: Pt impulsive with transitional movements and remains at risk for falls    Time 12    Period Weeks    Status On-going    Target Date 09/21/20      OT LONG TERM GOAL #6   Title Patient will demonstrate pouring a drink with the right hand with attention to right side and not knocking the cup over and spilling.      Baseline unable at eval, 10th visit starting to use right hand more and attempting to pour with right.  20th visit still has difficulty at times.  12/6: frequency is less for this but has occasional accidents with spilling if not focused on task.    Time 12    Period Weeks    Status On-going    Target Date 09/21/20                 Plan - 07/01/20 1453    Clinical Impression Statement Patient continues to progress in all areas, strength and right hand for gri has improved over the last certification period. Patient continues to demonstrate improvements with modulation of graded pressure with right hand when grasping and releasing objects. Patient no longer crushes items held in his right hand the majority of the time. He does continue to occasionally forget that items are in his hand secondary to decreased sensation and proprioception and may drop items. He has improved with self-feeding skills, with minimal spillage and is able to participate in social situations  with friends without requiring assistance to self feed. Patient no longer requires red foam buildup handle for grasping utensils. Patient continues to require increased time to manage buttons but is now also able to manipulate small buttons at his collar prior to putting a shirt on. Patient continues to  demonstrate difficulty with manipulation skills of the right hand with turning flipping or changing direction of objects while in hand. Difficulty this day with nine hole peg test with right hand and became frustrated which was a barrier to completing the test today. Goals were reviewed and updated to reflect progress. Patient continues to benefit from skilled occupational therapy intervention to maximize safety and independence in ADLs and IADLs.    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    Occupational performance deficits (Please refer to evaluation for details): ADL's;IADL's;Leisure    Body Structure / Function / Physical Skills ADL;Dexterity;Flexibility;ROM;Strength;Vision;Balance;Coordination;FMC;IADL;Tone;Endurance;UE functional use;Mobility    Cognitive Skills Attention;Safety Awareness;Problem Solve    Psychosocial Skills Environmental  Adaptations;Routines and Behaviors;Habits;Interpersonal Interaction    Rehab Potential Good    Clinical Decision Making Several treatment options, min-mod task modification necessary    Comorbidities Affecting Occupational Performance: May have comorbidities impacting occupational performance    Modification or Assistance to Complete Evaluation  Min-Moderate modification of tasks or assist with assess necessary to complete eval    OT Frequency 2x / week    OT Duration 12 weeks    OT Treatment/Interventions Self-care/ADL training;Visual/perceptual remediation/compensation;DME and/or AE instruction;Patient/family education;Moist Heat;Therapeutic exercise;Manual Therapy;Therapeutic activities;Neuromuscular education    Consulted  and Agree with Plan of Care Patient    Family Member Consulted wife, [name]           Patient will benefit from skilled therapeutic intervention in order to improve the following deficits and impairments:   Body Structure / Function / Physical Skills: ADL,Dexterity,Flexibility,ROM,Strength,Vision,Balance,Coordination,FMC,IADL,Tone,Endurance,UE functional use,Mobility Cognitive Skills: Attention,Safety Awareness,Problem Solve Psychosocial Skills: Environmental  Technical sales engineer   Visit Diagnosis: Muscle weakness (generalized)  Other lack of coordination  Neurologic neglect syndrome  Unsteadiness on feet  Difficulty in walking, not elsewhere classified    Problem List Patient Active Problem List   Diagnosis Date Noted  . Sleep disturbance   . PAF (paroxysmal atrial fibrillation) ([place])   . Recurrent UTI   . Hypokalemia   . Hyponatremia   . Abnormal urine   . 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 Benign essential HTN   . Urinary retention   . History of gout   . Global aphasia   . SAH (subarachnoid hemorrhage) ([place])   . SDH (subdural hematoma) ([place])   . Lymphocytosis   . Subdural hemorrhage following injury ([place]) 05/12/2016   Amarion Portell T Akeylah Hendel, OTR/L, CLT  Dyland Panuco 07/02/2020, 9:46 AM  [place] MAIN [facility] SERVICES [address] [place], [place], [address] Phone: [phone]   Fax:  [phone]  Name: Lathen Seal MRN: [id number] Date of Birth: 11-06-1940

## 2020-07-06 ENCOUNTER — Encounter: Payer: Self-pay | Admitting: Occupational Therapy

## 2020-07-06 ENCOUNTER — Other Ambulatory Visit: Payer: Self-pay

## 2020-07-06 ENCOUNTER — Ambulatory Visit: Payer: PPO | Admitting: Occupational Therapy

## 2020-07-06 DIAGNOSIS — M6281 Muscle weakness (generalized): Secondary | ICD-10-CM | POA: Diagnosis not present

## 2020-07-06 DIAGNOSIS — R414 Neurologic neglect syndrome: Secondary | ICD-10-CM

## 2020-07-06 DIAGNOSIS — R262 Difficulty in walking, not elsewhere classified: Secondary | ICD-10-CM

## 2020-07-06 DIAGNOSIS — R2681 Unsteadiness on feet: Secondary | ICD-10-CM

## 2020-07-06 DIAGNOSIS — R278 Other lack of coordination: Secondary | ICD-10-CM

## 2020-07-06 NOTE — Therapy (Signed)
[place] MAIN [facility] SERVICES [address] [place], [place], [address] Phone: [phone]   Fax:  [phone]  Occupational Therapy Treatment  Patient Details  Name: James [name] MRN: [id number] Date of Birth: [birth year] Referring Provider (OT): Joselyn Arrow   Encounter Date: 07/06/2020   OT End of Session - 07/09/20 2101    Visit Number [id number]    Number of Visits 72    Date for OT Re-Evaluation 09/21/20    OT Start Time 1100    OT Stop Time 1146    OT Time Calculation (min) 46 min    Activity Tolerance Patient tolerated treatment well    Behavior During Therapy [facility] for tasks assessed/performed           Past Medical History:  Diagnosis Date  . Alcohol abuse, in remission   . Atrial fibrillation ([place])   . Bilateral renal masses   . Closed right ankle fracture   . 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 Gait disorder   . Gout   . Hypertension   . Seizure disorder ([place])   . 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     Past Surgical History:  Procedure Laterality Date  . CRANIECTOMY FOR DEPRESSED SKULL FRACTURE  2009   with MRSA infection  . CRANIOTOMY     X 2  . HERNIA REPAIR     during childhood  . ORIF ANKLE FRACTURE Left 2012    There were no vitals filed for this visit.   Subjective Assessment - 07/09/20 2100    Subjective  Patient indicating he did some exercises at home over the weekend.    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  He suffered another fall in 2017.  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     Patient Stated Goals Patient indicates he wants to be stronger, use his right hand for daily tasks, be as independent as possible.    Currently in Pain? No/denies    Multiple Pain Sites No          Therapeutic  Exercise Finger and hand strengthening with use of Resistive pinch pins and yardstick placing in vertical position to encourage increased reach with right UE.  Able to complete all levels of resistance for lateral pinch, up to blue level with 3 point pinch.  Cues at times for proper form with pinch patterns.    Neuromuscular reeducation: Patient seen for manipulation of round Ball pegs with tip to tip pinch, place with difficulty able to pick up from container but has difficulty with turning and manipulating to get into the correct position to place into grid.  Able to perform if therapist hands patient item.  Able to remove items from grid with tip to tip pinch pattern with cues, right hand use.  Occasionally patient would attempt to turn item with left hand however would realize and correct self.   Response to tx: Patient seen for continued focus on right UE strengthening, ROM and manipulation skills.  Continues to demonstrate difficulty with turning of objects to be able to place into grid or specific position.  Responds well to cues and therapist demo.  Able to self correct today when attempting to use left hand when task became difficult.  Continue to  work towards goals in plan of care to improve right UE functional use for daily tasks.                      OT Education - 07/09/20 2100    Education provided Yes    Education Details manipulation skills, functional hand exercises, goals, update    Person(s) Educated Patient;Spouse    Methods Explanation;Demonstration;Verbal cues    Comprehension Verbalized understanding;Returned demonstration;Verbal cues required               OT Long Term Goals - 07/02/20 0941      OT LONG TERM GOAL #1   Title Patient will demonstrate use of utensil in right hand for self feeding with modified independence with little to no spillage.     Baseline unable at eval to hold utensil, 10th visit holding utensil, some feeding. 20th visit,  feeding self consistently but still has some moderate spillage    Time 12    Period Weeks    Status Achieved    Target Date 09/21/20      OT LONG TERM GOAL #2   Title Pt will increase hand strength in right by 5# to be able to cut food with modified independence.    Baseline unable to cut meat at evaluation, 10th visit starting to work on cutting meat, patient reports difficulty with cutting meat. 12/6 still has difficulty with cutting meat unless it does not require a knife    Time 12    Period Weeks    Status On-going    Target Date 09/21/20      OT LONG TERM GOAL #3   Title Patient will demonstrate increased awareness and strategies to attend to right side to hold items without dropping items.    Baseline difficulty with detecting items in right hand at eval, forgets items are in hand, 10th visit improved but still working on 12-30-2019: minimal dropping of items this date.  20th visit continued dropping of items occasionally but with increased awareness.  Update:  remains inconsistent but does well with repeated cues    Time 12    Period Weeks    Status On-going    Target Date 09/21/20      OT LONG TERM GOAL #4   Title Patient will demonstrate independence in home exercise program for ROM, strength and coordination of right hand.    Baseline eval-patient has not been performing exercises from last episode of care and demonstrates decline, 10th visit doing exercises at home and continue to modify program. 20th visit pt continues to engage in HEP with upgrades frequently    Time 12    Period Weeks    Status Partially Met    Target Date 09/21/20      OT LONG TERM GOAL #5   Title Pt to demonstrate improved balance with transitional movements with sit to stand from all surfaces with no loss of balance to reduce fall risk. 12/6: Pt impulsive with transitional movements and remains at risk for falls    Time 12    Period Weeks    Status On-going    Target Date 09/21/20      OT LONG  TERM GOAL #6   Title Patient will demonstrate pouring a drink with the right hand with attention to right side and not knocking the cup over and spilling.      Baseline unable at eval, 10th visit starting to use right hand more and attempting to  pour with right.  20th visit still has difficulty at times.  12/6: frequency is less for this but has occasional accidents with spilling if not focused on task.    Time 12    Period Weeks    Status On-going    Target Date 09/21/20                 Plan - 07/09/20 2101    Clinical Impression Statement Patient seen for continued focus on right UE strengthening, ROM and manipulation skills.  Continues to demonstrate difficulty with turning of objects to be able to place into grid or specific position.  Responds well to cues and therapist demo.  Able to self correct today when attempting to use left hand when task became difficult.  Continue to work towards goals in plan of care to improve right UE functional use for daily tasks.    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    Occupational performance deficits (Please refer to evaluation for details): ADL's;IADL's;Leisure    Body Structure / Function / Physical Skills ADL;Dexterity;Flexibility;ROM;Strength;Vision;Balance;Coordination;FMC;IADL;Tone;Endurance;UE functional use;Mobility    Cognitive Skills Attention;Safety Awareness;Problem Solve    Psychosocial Skills Environmental  Adaptations;Routines and Behaviors;Habits;Interpersonal Interaction    Rehab Potential Good    Clinical Decision Making Several treatment options, min-mod task modification necessary     Comorbidities Affecting Occupational Performance: May have comorbidities impacting occupational performance    Modification or Assistance to Complete Evaluation  Min-Moderate modification of tasks or assist with assess necessary to complete eval    OT Frequency 1x / week    OT Duration 12 weeks    OT Treatment/Interventions Self-care/ADL training;Visual/perceptual remediation/compensation;DME and/or AE instruction;Patient/family education;Moist Heat;Therapeutic exercise;Manual Therapy;Therapeutic activities;Neuromuscular education    Consulted and Agree with Plan of Care Patient    Family Member Consulted wife, [name]           Patient will benefit from skilled therapeutic intervention in order to improve the following deficits and impairments:   Body Structure / Function / Physical Skills: ADL,Dexterity,Flexibility,ROM,Strength,Vision,Balance,Coordination,FMC,IADL,Tone,Endurance,UE functional use,Mobility Cognitive Skills: Attention,Safety Awareness,Problem Solve Psychosocial Skills: Environmental  Technical sales engineer   Visit Diagnosis: Muscle weakness (generalized)  Other lack of coordination  Neurologic neglect syndrome  Unsteadiness on feet  Difficulty in walking, not elsewhere classified    Problem List Patient Active Problem List   Diagnosis Date Noted  . Sleep disturbance   . PAF (paroxysmal atrial fibrillation) ([place])   . Recurrent UTI   . Hypokalemia   . Hyponatremia   . Abnormal urine   . 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 Benign essential HTN   . Urinary retention   . History of gout   . Global aphasia   . SAH (subarachnoid hemorrhage) ([place])   . SDH (subdural hematoma) ([place])   . Lymphocytosis   . Subdural hemorrhage following injury ([place]) 05/12/2016   Pecolia Marando T Kamille Toomey, OTR/L, CLT  Arlenne Kimbley 07/09/2020, 9:10 PM  [place] MAIN [facility] SERVICES [address] [place], [place], [address] Phone: [phone]   Fax:  [phone]  Name: James [name] MRN: [id number] Date of Birth: 08-01-1940

## 2020-07-08 ENCOUNTER — Ambulatory Visit: Payer: PPO | Admitting: Occupational Therapy

## 2020-07-13 ENCOUNTER — Other Ambulatory Visit: Payer: Self-pay

## 2020-07-13 ENCOUNTER — Ambulatory Visit: Payer: PPO | Admitting: Occupational Therapy

## 2020-07-13 DIAGNOSIS — R414 Neurologic neglect syndrome: Secondary | ICD-10-CM

## 2020-07-13 DIAGNOSIS — R278 Other lack of coordination: Secondary | ICD-10-CM

## 2020-07-13 DIAGNOSIS — M6281 Muscle weakness (generalized): Secondary | ICD-10-CM

## 2020-07-13 DIAGNOSIS — R262 Difficulty in walking, not elsewhere classified: Secondary | ICD-10-CM

## 2020-07-13 DIAGNOSIS — R2681 Unsteadiness on feet: Secondary | ICD-10-CM

## 2020-07-15 ENCOUNTER — Encounter: Payer: Self-pay | Admitting: Occupational Therapy

## 2020-07-15 ENCOUNTER — Ambulatory Visit: Payer: PPO | Admitting: Occupational Therapy

## 2020-07-15 NOTE — Therapy (Signed)
[place] MAIN [facility] SERVICES [address] [place], [place], [address] Phone: [phone]   Fax:  [phone]  Occupational Therapy Treatment  Patient Details  Name: James [name] MRN: [id number] Date of Birth: October 03, 1940 Referring Provider (OT): Joselyn Arrow   Encounter Date: 07/13/2020   OT End of Session - 07/15/20 1702    Visit Number 38    Number of Visits 72    Date for OT Re-Evaluation 09/21/20    OT Start Time 1101    OT Stop Time 1150    OT Time Calculation (min) 49 min    Activity Tolerance Patient tolerated treatment well    Behavior During Therapy [facility] for tasks assessed/performed           Past Medical History:  Diagnosis Date  . Alcohol abuse, in remission   . Atrial fibrillation ([place])   . Bilateral renal masses   . Closed right ankle fracture   . 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 Gait disorder   . Gout   . Hypertension   . Seizure disorder ([place])   . 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     Past Surgical History:  Procedure Laterality Date  . CRANIECTOMY FOR DEPRESSED SKULL FRACTURE  2009   with MRSA infection  . CRANIOTOMY     X 2  . HERNIA REPAIR     during childhood  . ORIF ANKLE FRACTURE Left 2012    There were no vitals filed for this visit.   Subjective Assessment - 07/15/20 1702    Subjective  Patient and wife indicating they are ready for the holidays.  Patient brought in a Christmas card where he signed his name on it.    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  He suffered another fall in 2017.  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     Patient Stated Goals Patient indicates he wants to be stronger, use his right hand for daily tasks, be as independent as possible.    Currently in Pain?  No/denies    Multiple Pain Sites No          Therapeutic Exercise: Patient seen for UB strengthening with use of UBE from seated position, forwards/backwards, alternating levels of resistance from 3.0 to 3.5 with therapist in constant attendance to ensure grip and adjust settings.  Reaching tasks with use of SAEBO tower, 3 levels of reach with looped balls in standing, dropping 3 with min guard to min assist to retrieve, able to complete all 4 levels of reach from standing position.  Neuro: Focus on fine motor coordination tasks with use of Bethena Roys board with med complexity pattern design to follow, cues at times for making sure he was on the correct row when starting a row.  Dropping multiple pegs on the floor, assist with picking them up for balance when reaching down.      Response to tx: Patient continues to demonstrate impairments in balance with tasks in standing and especially when requiring reaching outside of base of support and when retrieving items dropped to floor.  Patient remains impulsive at times with responses to dropped items and requires cues and min assist for balance.  If he slows down his movements and reactions, balance improves.  Patient  continues to drop items frequently, especially when working with round items that tend to roll off the table when reaching for items.  Continue to work towards goals to impact right hand function and balance for daily tasks.                      OT Education - 07/15/20 1702    Education provided Yes    Education Details manipulation skills, functional hand exercises    Person(s) Educated Patient;Spouse    Methods Explanation;Demonstration;Verbal cues    Comprehension Verbalized understanding;Returned demonstration;Verbal cues required               OT Long Term Goals - 07/02/20 0941      OT LONG TERM GOAL #1   Title Patient will demonstrate use of utensil in right hand for self feeding with modified independence  with little to no spillage.     Baseline unable at eval to hold utensil, 10th visit holding utensil, some feeding. 20th visit, feeding self consistently but still has some moderate spillage    Time 12    Period Weeks    Status Achieved    Target Date 09/21/20      OT LONG TERM GOAL #2   Title Pt will increase hand strength in right by 5# to be able to cut food with modified independence.    Baseline unable to cut meat at evaluation, 10th visit starting to work on cutting meat, patient reports difficulty with cutting meat. 12/6 still has difficulty with cutting meat unless it does not require a knife    Time 12    Period Weeks    Status On-going    Target Date 09/21/20      OT LONG TERM GOAL #3   Title Patient will demonstrate increased awareness and strategies to attend to right side to hold items without dropping items.    Baseline difficulty with detecting items in right hand at eval, forgets items are in hand, 10th visit improved but still working on 12-30-2019: minimal dropping of items this date.  20th visit continued dropping of items occasionally but with increased awareness.  Update:  remains inconsistent but does well with repeated cues    Time 12    Period Weeks    Status On-going    Target Date 09/21/20      OT LONG TERM GOAL #4   Title Patient will demonstrate independence in home exercise program for ROM, strength and coordination of right hand.    Baseline eval-patient has not been performing exercises from last episode of care and demonstrates decline, 10th visit doing exercises at home and continue to modify program. 20th visit pt continues to engage in HEP with upgrades frequently    Time 12    Period Weeks    Status Partially Met    Target Date 09/21/20      OT LONG TERM GOAL #5   Title Pt to demonstrate improved balance with transitional movements with sit to stand from all surfaces with no loss of balance to reduce fall risk. 12/6: Pt impulsive with transitional  movements and remains at risk for falls    Time 12    Period Weeks    Status On-going    Target Date 09/21/20      OT LONG TERM GOAL #6   Title Patient will demonstrate pouring a drink with the right hand with attention to right side and not knocking the cup over and spilling.  Baseline unable at eval, 10th visit starting to use right hand more and attempting to pour with right.  20th visit still has difficulty at times.  12/6: frequency is less for this but has occasional accidents with spilling if not focused on task.    Time 12    Period Weeks    Status On-going    Target Date 09/21/20                 Plan - 07/15/20 1703    Clinical Impression Statement Patient continues to demonstrate impairments in balance with tasks in standing and especially when requiring reaching outside of base of support and when retrieving items dropped to floor.  Patient remains impulsive at times with responses to dropped items and requires cues and min assist for balance.  If he slows down his movements and reactions, balance improves.  Patient continues to drop items frequently, especially when working with round items that tend to roll off the table when reaching for items.  Continue to work towards goals to impact right hand function and balance for daily tasks.    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    Occupational performance deficits (Please refer to evaluation for details): ADL's;IADL's;Leisure    Body Structure / Function / Physical Skills ADL;Dexterity;Flexibility;ROM;Strength;Vision;Balance;Coordination;FMC;IADL;Tone;Endurance;UE functional use;Mobility     Cognitive Skills Attention;Safety Awareness;Problem Solve    Psychosocial Skills Environmental  Adaptations;Routines and Behaviors;Habits;Interpersonal Interaction    Rehab Potential Good    Clinical Decision Making Several treatment options, min-mod task modification necessary    Comorbidities Affecting Occupational Performance: May have comorbidities impacting occupational performance    Modification or Assistance to Complete Evaluation  Min-Moderate modification of tasks or assist with assess necessary to complete eval    OT Frequency 1x / week    OT Duration 12 weeks    OT Treatment/Interventions Self-care/ADL training;Visual/perceptual remediation/compensation;DME and/or AE instruction;Patient/family education;Moist Heat;Therapeutic exercise;Manual Therapy;Therapeutic activities;Neuromuscular education    Consulted and Agree with Plan of Care Patient    Family Member Consulted wife, [name]           Patient will benefit from skilled therapeutic intervention in order to improve the following deficits and impairments:   Body Structure / Function / Physical Skills: ADL,Dexterity,Flexibility,ROM,Strength,Vision,Balance,Coordination,FMC,IADL,Tone,Endurance,UE functional use,Mobility Cognitive Skills: Attention,Safety Awareness,Problem Solve Psychosocial Skills: Environmental  Technical sales engineer   Visit Diagnosis: Muscle weakness (generalized)  Other lack of coordination  Neurologic neglect syndrome  Unsteadiness on feet  Difficulty in walking, not elsewhere classified    Problem List Patient Active Problem List   Diagnosis Date Noted  . Sleep disturbance   . PAF (paroxysmal atrial fibrillation) ([place])   . Recurrent UTI   . Hypokalemia   . Hyponatremia   . Abnormal urine   . 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 Benign essential HTN   . Urinary retention   . History of gout   . Global aphasia   . SAH  (subarachnoid hemorrhage) ([place])   . SDH (subdural hematoma) ([place])   . Lymphocytosis   . Subdural hemorrhage following injury ([place]) 05/12/2016   James [name] T Ariadne Rissmiller, OTR/L, CLT  James [name] 07/15/2020, 5:14 PM  [place] [facility] RE[facility] SERVICES [address]  [place], [place], [address] Phone: [phone]   Fax:  [phone]  Name: James [name] MRN: [id number] Date of Birth: 1940-11-25

## 2020-07-20 ENCOUNTER — Other Ambulatory Visit: Payer: Self-pay

## 2020-07-20 ENCOUNTER — Ambulatory Visit: Payer: PPO | Admitting: Occupational Therapy

## 2020-07-20 ENCOUNTER — Encounter: Payer: Self-pay | Admitting: Occupational Therapy

## 2020-07-20 DIAGNOSIS — R278 Other lack of coordination: Secondary | ICD-10-CM

## 2020-07-20 DIAGNOSIS — R2681 Unsteadiness on feet: Secondary | ICD-10-CM

## 2020-07-20 DIAGNOSIS — M6281 Muscle weakness (generalized): Secondary | ICD-10-CM

## 2020-07-20 DIAGNOSIS — R262 Difficulty in walking, not elsewhere classified: Secondary | ICD-10-CM

## 2020-07-20 DIAGNOSIS — R414 Neurologic neglect syndrome: Secondary | ICD-10-CM

## 2020-07-20 NOTE — Therapy (Signed)
[place] MAIN [facility] SERVICES [address] [place], [place], [address] Phone: [phone]   Fax:  [phone]  Occupational Therapy Treatment  Patient Details  Name: James [name] MRN: [id number] Date of Birth: 03/19/1941 Referring Provider (OT): Joselyn Arrow   Encounter Date: 07/20/2020   OT End of Session - 07/20/20 1119    Visit Number [id number]    Number of Visits 72    Date for OT Re-Evaluation 09/21/20    OT Start Time 1100    OT Stop Time 1147    OT Time Calculation (min) 47 min    Activity Tolerance Patient tolerated treatment well    Behavior During Therapy [facility] for tasks assessed/performed           Past Medical History:  Diagnosis Date  . Alcohol abuse, in remission   . Atrial fibrillation ([place])   . Bilateral renal masses   . Closed right ankle fracture   . 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 Gait disorder   . Gout   . Hypertension   . Seizure disorder ([place])   . 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     Past Surgical History:  Procedure Laterality Date  . CRANIECTOMY FOR DEPRESSED SKULL FRACTURE  2009   with MRSA infection  . CRANIOTOMY     X 2  . HERNIA REPAIR     during childhood  . ORIF ANKLE FRACTURE Left 2012    There were no vitals filed for this visit.   Subjective Assessment - 07/20/20 1118    Subjective  Pt reports he had a good holiday spent with his wife.  Did some exercises over the holiday weekend.    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  He suffered another fall in 2017.  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     Patient Stated Goals Patient indicates he wants to be stronger, use his right hand for daily tasks, be as independent as possible.    Currently in Pain? No/denies    Multiple Pain  Sites No          Patient seen for upper body strengthening with use of UBE with resistance of 3.0 to 3.5  for 8 mins, forwards, backwards with therapist in constant attendance to adjust settings and to ensure grip.   Resistive pinch pins, from least resistive yellow to most resistive black with right hand for lateral and 3 point pinch patterns, pt placing pins onto small dowels in various planes of motion to encourage reach. Neuro: Patient seen for manipulation of medium and large washers, picking up from magnetic bowl and placing onto magnetic hooks and an elevated plane of motion to encourage reach.  Difficulty with smaller washers when placing and removing onto hooks, dropping items frequently.  Patient requires occasional cues for tip to tip prehension patterns on the right.  ADL: Handwriting with focus on sizing of letters to put signature in a smaller space such as when writing out a check. Utilized a simulated check writing with filling in his name and signature on the smaller lines of the check with use of a gel pen.  Cues at times for tripod grasping pattern for secure grip.  Legibility remains fair to poor at times, improved at times  with repeated trials.    Response to tx:  Patient demonstrated difficulty with the washers and hook activity, difficulty with manipulation of the smaller washers to place onto hooks.  Increased difficulty with removing items from the hooks, dropping items frequently and requiring either assist to retrieve or min guard to min assist for balance if he is attempting to retrieve.  Pt very focused on handwriting and would like to see improvements in this area.  He does practice at home on his handwriting skills on a regular basis and although legibility is still fair to poor, it has improved over time. Continue to work towards goals in plan of care to work on Engineer, production for manipulation and therapeutic use of right hand for necessary daily tasks.                        OT Education - 07/20/20 1119    Education provided Yes    Education Details fine motor coordination, tip to tip prehension patterns.    Person(s) Educated Patient;Spouse    Methods Explanation;Demonstration;Verbal cues    Comprehension Verbalized understanding;Returned demonstration;Verbal cues required               OT Long Term Goals - 07/02/20 0941      OT LONG TERM GOAL #1   Title Patient will demonstrate use of utensil in right hand for self feeding with modified independence with little to no spillage.     Baseline unable at eval to hold utensil, 10th visit holding utensil, some feeding. 20th visit, feeding self consistently but still has some moderate spillage    Time 12    Period Weeks    Status Achieved    Target Date 09/21/20      OT LONG TERM GOAL #2   Title Pt will increase hand strength in right by 5# to be able to cut food with modified independence.    Baseline unable to cut meat at evaluation, 10th visit starting to work on cutting meat, patient reports difficulty with cutting meat. 12/6 still has difficulty with cutting meat unless it does not require a knife    Time 12    Period Weeks    Status On-going    Target Date 09/21/20      OT LONG TERM GOAL #3   Title Patient will demonstrate increased awareness and strategies to attend to right side to hold items without dropping items.    Baseline difficulty with detecting items in right hand at eval, forgets items are in hand, 10th visit improved but still working on 12-30-2019: minimal dropping of items this date.  20th visit continued dropping of items occasionally but with increased awareness.  Update:  remains inconsistent but does well with repeated cues    Time 12    Period Weeks    Status On-going    Target Date 09/21/20      OT LONG TERM GOAL #4   Title Patient will demonstrate independence in home exercise program for ROM, strength and coordination of right hand.     Baseline eval-patient has not been performing exercises from last episode of care and demonstrates decline, 10th visit doing exercises at home and continue to modify program. 20th visit pt continues to engage in HEP with upgrades frequently    Time 12    Period Weeks    Status Partially Met    Target Date 09/21/20      OT LONG TERM GOAL #  5   Title Pt to demonstrate improved balance with transitional movements with sit to stand from all surfaces with no loss of balance to reduce fall risk. 12/6: Pt impulsive with transitional movements and remains at risk for falls    Time 12    Period Weeks    Status On-going    Target Date 09/21/20      OT LONG TERM GOAL #6   Title Patient will demonstrate pouring a drink with the right hand with attention to right side and not knocking the cup over and spilling.      Baseline unable at eval, 10th visit starting to use right hand more and attempting to pour with right.  20th visit still has difficulty at times.  12/6: frequency is less for this but has occasional accidents with spilling if not focused on task.    Time 12    Period Weeks    Status On-going    Target Date 09/21/20                 Plan - 07/20/20 1120    Clinical Impression Statement Patient demonstrated difficulty with the washers and hook activity, difficulty with manipulation of the smaller washers to place onto hooks.  Increased difficulty with removing items from the hooks, dropping items frequently and requiring either assist to retrieve or min guard to min assist for balance if he is attempting to retrieve.  Pt very focused on handwriting and would like to see improvements in this area.  He does practice at home on his handwriting skills on a regular basis and although legibility is still fair to poor, it has improved over time. Continue to work towards goals in plan of care to work on Engineer, production for manipulation and therapeutic use of right hand for necessary daily  tasks.    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    Occupational performance deficits (Please refer to evaluation for details): ADL's;IADL's;Leisure    Body Structure / Function / Physical Skills ADL;Dexterity;Flexibility;ROM;Strength;Vision;Balance;Coordination;FMC;IADL;Tone;Endurance;UE functional use;Mobility    Cognitive Skills Attention;Safety Awareness;Problem Solve    Psychosocial Skills Environmental  Adaptations;Routines and Behaviors;Habits;Interpersonal Interaction    Rehab Potential Good    Clinical Decision Making Several treatment options, min-mod task modification necessary    Comorbidities Affecting Occupational Performance: May have comorbidities impacting occupational performance    Modification or Assistance to Complete Evaluation  Min-Moderate modification of tasks or assist with assess necessary to complete eval    OT Frequency 1x / week    OT Duration 12 weeks    OT Treatment/Interventions Self-care/ADL training;Visual/perceptual remediation/compensation;DME and/or AE instruction;Patient/family education;Moist Heat;Therapeutic exercise;Manual Therapy;Therapeutic activities;Neuromuscular education    Consulted and Agree with Plan of Care Patient    Family Member Consulted wife, [name]           Patient will benefit from skilled therapeutic intervention in order to improve the following deficits and impairments:   Body Structure / Function / Physical Skills: ADL,Dexterity,Flexibility,ROM,Strength,Vision,Balance,Coordination,FMC,IADL,Tone,Endurance,UE functional use,Mobility Cognitive Skills: Attention,Safety Awareness,Problem  Solve Psychosocial Skills: Environmental  Technical sales engineer   Visit Diagnosis: Muscle weakness (generalized)  Other lack of coordination  Neurologic neglect syndrome  Unsteadiness on feet  Difficulty in walking, not elsewhere classified  Problem List Patient Active Problem List   Diagnosis Date Noted  . Sleep disturbance   . PAF (paroxysmal atrial fibrillation) ([place])   . Recurrent UTI   . Hypokalemia   . Hyponatremia   . Abnormal urine   . 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 Benign essential HTN   . Urinary retention   . History of gout   . Global aphasia   . SAH (subarachnoid hemorrhage) ([place])   . SDH (subdural hematoma) ([place])   . Lymphocytosis   . Subdural hemorrhage following injury ([place]) 05/12/2016   Destane Speas T Rojean Ige, OTR/L, CLT  Maeryn Mcgath 07/20/2020, 4:04 PM  [place] MAIN [facility] SERVICES [address] [place], [place], [address] Phone: [phone]   Fax:  [phone]  Name: Shyheim Tanney MRN: [id number] Date of Birth: 1941/06/24

## 2020-07-22 ENCOUNTER — Ambulatory Visit: Payer: PPO | Admitting: Occupational Therapy

## 2020-07-27 ENCOUNTER — Ambulatory Visit: Payer: PPO | Attending: Neurology | Admitting: Occupational Therapy

## 2020-07-27 ENCOUNTER — Other Ambulatory Visit: Payer: Self-pay

## 2020-07-27 DIAGNOSIS — R2681 Unsteadiness on feet: Secondary | ICD-10-CM | POA: Insufficient documentation

## 2020-07-27 DIAGNOSIS — R278 Other lack of coordination: Secondary | ICD-10-CM | POA: Insufficient documentation

## 2020-07-27 DIAGNOSIS — M6281 Muscle weakness (generalized): Secondary | ICD-10-CM | POA: Insufficient documentation

## 2020-07-27 DIAGNOSIS — R262 Difficulty in walking, not elsewhere classified: Secondary | ICD-10-CM | POA: Insufficient documentation

## 2020-07-27 DIAGNOSIS — R414 Neurologic neglect syndrome: Secondary | ICD-10-CM | POA: Diagnosis not present

## 2020-07-28 ENCOUNTER — Encounter: Payer: Self-pay | Admitting: Occupational Therapy

## 2020-07-28 NOTE — Therapy (Signed)
[place] [facility] MAIN [facility] SERVICES [address] [place], [place], [address] Phone: [phone]   Fax:  [phone]  Occupational Therapy Treatment  Patient Details  Name: James [name] MRN: [id number] Date of Birth: 1940-08-19 Referring Provider (OT): Steele Sizer   Encounter Date: 07/27/2020   OT End of Session - 07/28/20 1031    Visit Number 64    Number of Visits 72    Date for OT Re-Evaluation 09/21/20    OT Start Time 1020    OT Stop Time 1112    OT Time Calculation (min) 52 min    Activity Tolerance Patient tolerated treatment well    Behavior During Therapy [facility] for tasks assessed/performed           Past Medical History:  Diagnosis Date  . Alcohol abuse, in remission   . Atrial fibrillation ([facility])   . Bilateral renal masses   . Closed right ankle fracture   . 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 Gait disorder   . Gout   . Hypertension   . Seizure disorder ([facility])   . 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     Past Surgical History:  Procedure Laterality Date  . CRANIECTOMY FOR DEPRESSED SKULL FRACTURE  2009   with MRSA infection  . CRANIOTOMY     X 2  . HERNIA REPAIR     during childhood  . ORIF ANKLE FRACTURE Left 2012    There were no vitals filed for this visit.   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  He suffered another fall in 2017.  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     Patient Stated Goals Patient indicates he wants to be stronger, use his right hand for daily tasks, be as independent as possible.    Currently in Pain? No/denies    Multiple Pain Sites No           Therex:   No pain noted today, snowing outside and decided to come a little early to get  therapy done and get back home again.  Patient seen for upper body strengthening with use of UBE with resistance of 3.0 to 4.0 for 8 mins, forwards, backwards with therapist in constant attendance to adjust settings and to ensure grip.   Patient seen for reaching patterns in various directions from seated position for ROM and strengthening of RUE.  Neuro: Attempted a new task today of performing Search a word with moderate difficulty level, able to seek out a couple words, the focus more of the task being to use the pen in the right hand to work on circling the words with emphasis on control and precision.  Difficulty initially but improved with repetitions.  Decreased control with right hand evident with this task. Added use of scrabble letters to turn and flip to letter side up with right hand, cues for prehension patterns and then working to spell out/copy words from search a word list.   Issued homework with 3 more simplistic search a words for home and instructed patient and wife on use of scrabble letters at home to make words from list.      Response to tx:  Patient came today for therapy  despite inclement weather and was ready to work.  New task today with search a word activity which is difficult with his aphasia but he is able to look for and compare list of words at the top.  Moderate level difficulty with task in therapy, downgraded homework list to a smaller, simpler list of words and search for home.  Also worked towards prehension and manipulation skills with use of scrabble letters to form words from the list.  Patient tends to perform better with tasks that have a result/outcome and can attract his concentration and focus.  Will recheck with patient and wife regarding homework assignment to see if it was difficult for patient.                      OT Education - 07/28/20 1030    Education provided Yes    Education Details word search, scrabble letters    Person(s)  Educated Patient;Spouse    Methods Explanation;Demonstration;Verbal cues    Comprehension Verbalized understanding;Returned demonstration;Verbal cues required               OT Long Term Goals - 07/02/20 0941      OT LONG TERM GOAL #1   Title Patient will demonstrate use of utensil in right hand for self feeding with modified independence with little to no spillage.     Baseline unable at eval to hold utensil, 10th visit holding utensil, some feeding. 20th visit, feeding self consistently but still has some moderate spillage    Time 12    Period Weeks    Status Achieved    Target Date 09/21/20      OT LONG TERM GOAL #2   Title Pt will increase hand strength in right by 5# to be able to cut food with modified independence.    Baseline unable to cut meat at evaluation, 10th visit starting to work on cutting meat, patient reports difficulty with cutting meat. 12/6 still has difficulty with cutting meat unless it does not require a knife    Time 12    Period Weeks    Status On-going    Target Date 09/21/20      OT LONG TERM GOAL #3   Title Patient will demonstrate increased awareness and strategies to attend to right side to hold items without dropping items.    Baseline difficulty with detecting items in right hand at eval, forgets items are in hand, 10th visit improved but still working on 12-30-2019: minimal dropping of items this date.  20th visit continued dropping of items occasionally but with increased awareness.  Update:  remains inconsistent but does well with repeated cues    Time 12    Period Weeks    Status On-going    Target Date 09/21/20      OT LONG TERM GOAL #4   Title Patient will demonstrate independence in home exercise program for ROM, strength and coordination of right hand.    Baseline eval-patient has not been performing exercises from last episode of care and demonstrates decline, 10th visit doing exercises at home and continue to modify program. 20th visit pt  continues to engage in HEP with upgrades frequently    Time 12    Period Weeks    Status Partially Met    Target Date 09/21/20      OT LONG TERM GOAL #5   Title Pt to demonstrate improved balance with transitional movements with sit to stand from all surfaces with  no loss of balance to reduce fall risk. 12/6: Pt impulsive with transitional movements and remains at risk for falls    Time 12    Period Weeks    Status On-going    Target Date 09/21/20      OT LONG TERM GOAL #6   Title Patient will demonstrate pouring a drink with the right hand with attention to right side and not knocking the cup over and spilling.      Baseline unable at eval, 10th visit starting to use right hand more and attempting to pour with right.  20th visit still has difficulty at times.  12/6: frequency is less for this but has occasional accidents with spilling if not focused on task.    Time 12    Period Weeks    Status On-going    Target Date 09/21/20                 Plan - 07/28/20 1031    Clinical Impression Statement Patient came today for therapy despite inclement weather and was ready to work.  New task today with search a word activity which is difficult with his aphasia but he is able to look for and compare list of words at the top.  Moderate level difficulty with task in therapy, downgraded homework list to a smaller, simpler list of words and search for home.  Also worked towards prehension and manipulation skills with use of scrabble letters to form words from the list.  Patient tends to perform better with tasks that have a result/outcome and can attract his concentration and focus.  Will recheck with patient and wife regarding homework assignment to see if it was difficult for patient.    OT Occupational Profile and History Detailed Assessment- Review of Records and additional review of physical, cognitive, psychosocial history related to current functional performance    Occupational Profile  and client history currently impacting functional performance repeated falls, TBI, expressive aphasia, limited motion in right hand, lack of coordination from previous CVA    Occupational performance deficits (Please refer to evaluation for details): ADL's;IADL's;Leisure    Body Structure / Function / Physical Skills ADL;Dexterity;Flexibility;ROM;Strength;Vision;Balance;Coordination;FMC;IADL;Tone;Endurance;UE functional use;Mobility    Cognitive Skills Attention;Safety Awareness;Problem Solve    Psychosocial Skills Environmental  Adaptations;Routines and Behaviors;Habits;Interpersonal Interaction    Rehab Potential Good    Clinical Decision Making Several treatment options, min-mod task modification necessary    Comorbidities Affecting Occupational Performance: May have comorbidities impacting occupational performance    Modification or Assistance to Complete Evaluation  Min-Moderate modification of tasks or assist with assess necessary to complete eval    OT Frequency 1x / week    OT Duration 12 weeks    OT Treatment/Interventions Self-care/ADL training;Visual/perceptual remediation/compensation;DME and/or AE instruction;Patient/family education;Moist Heat;Therapeutic exercise;Manual Therapy;Therapeutic activities;Neuromuscular education    Consulted and Agree with Plan of Care Patient    Family Member Consulted wife, [name]           Patient will benefit from skilled therapeutic intervention in order to improve the following deficits and impairments:   Body Structure / Function / Physical Skills: ADL,Dexterity,Flexibility,ROM,Strength,Vision,Balance,Coordination,FMC,IADL,Tone,Endurance,UE functional use,Mobility Cognitive Skills: Attention,Safety Awareness,Problem Solve Psychosocial Skills: Environmental  Technical sales engineer   Visit Diagnosis: Muscle weakness (generalized)  Other lack of coordination  Neurologic neglect  syndrome  Unsteadiness on feet  Difficulty in walking, not elsewhere classified    Problem List Patient Active Problem List   Diagnosis Date Noted  . Sleep disturbance   . PAF (  paroxysmal atrial fibrillation) ([place])   . Recurrent UTI   . Hypokalemia   . Hyponatremia   . Abnormal urine   . 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 Benign essential HTN   . Urinary retention   . History of gout   . Global aphasia   . SAH (subarachnoid hemorrhage) ([place])   . SDH (subdural hematoma) ([place])   . Lymphocytosis   . Subdural hemorrhage following injury [facility]) 05/12/2016   Lawson Isabell T Ernst [place], OTR/L, CLT  Sherrise Liberto 07/28/2020, 10:52 AM  [place] MAIN [facility] SERVICES [address] [place], [place], [address] Phone: [phone]   Fax:  [phone]  Name: Iasiah Ozment MRN: [id number] Date of Birth: Jun 03, 1941

## 2020-07-29 ENCOUNTER — Ambulatory Visit: Payer: PPO | Admitting: Occupational Therapy

## 2020-08-03 ENCOUNTER — Ambulatory Visit: Payer: PPO | Admitting: Occupational Therapy

## 2020-08-03 ENCOUNTER — Other Ambulatory Visit: Payer: Self-pay

## 2020-08-03 DIAGNOSIS — M6281 Muscle weakness (generalized): Secondary | ICD-10-CM

## 2020-08-03 DIAGNOSIS — R414 Neurologic neglect syndrome: Secondary | ICD-10-CM

## 2020-08-03 DIAGNOSIS — R278 Other lack of coordination: Secondary | ICD-10-CM

## 2020-08-03 DIAGNOSIS — R2681 Unsteadiness on feet: Secondary | ICD-10-CM

## 2020-08-03 DIAGNOSIS — R262 Difficulty in walking, not elsewhere classified: Secondary | ICD-10-CM

## 2020-08-05 ENCOUNTER — Ambulatory Visit: Payer: PPO | Admitting: Occupational Therapy

## 2020-08-07 ENCOUNTER — Encounter: Payer: Self-pay | Admitting: Occupational Therapy

## 2020-08-07 NOTE — Therapy (Signed)
[place] MAIN [facility] SERVICES [address] [place], [place], [address] Phone: [phone]   Fax:  [phone]  Occupational Therapy Treatment  Patient Details  Name: James [name] MRN: [id number] Date of Birth: 02/26/1941 Referring Provider (OT): Joselyn Arrow   Encounter Date: 08/03/2020   OT End of Session - 08/07/20 1803    Visit Number [id number]    Number of Visits 72    Date for OT Re-Evaluation 09/21/20    OT Start Time 1014    OT Stop Time 1100    OT Time Calculation (min) 46 min    Activity Tolerance Patient tolerated treatment well    Behavior During Therapy [facility] for tasks assessed/performed           Past Medical History:  Diagnosis Date  . Alcohol abuse, in remission   . Atrial fibrillation ([place])   . Bilateral renal masses   . Closed right ankle fracture   . 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 Gait disorder   . Gout   . Hypertension   . Seizure disorder ([place])   . 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     Past Surgical History:  Procedure Laterality Date  . CRANIECTOMY FOR DEPRESSED SKULL FRACTURE  2009   with MRSA infection  . CRANIOTOMY     X 2  . HERNIA REPAIR     during childhood  . ORIF ANKLE FRACTURE Left 2012    There were no vitals filed for this visit.   Subjective Assessment - 08/07/20 1800    Subjective  Pts wife reports patient did his homework but when they reviewed some of the words from the puzzles such as arm, head, nose, etc.  patient could not show her or point to the correct parts even though it appeared he understood the words.    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  He suffered another fall in 2017.  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     Patient Stated Goals Patient indicates he wants  to be stronger, use his right hand for daily tasks, be as independent as possible.    Currently in Pain? No/denies    Multiple Pain Sites No           Therapeutic Activities:   Utilized pts homework of search a word and used list of words of body parts.  Patient reading words and appeared to know what each word meant, however when asked to point to body parts he was unable to.  Therapist drew a picture to include all the body parts and patient was not able to point or identify parts with picture.   Once patient was shown with each word and therapist pointed to each body part, pt was able to mirror the gestures and repeat on multiple trials.   Card scanning with scatter pile, pt does well with numbers and finding face cards but required demonstration for instructions for sorting cards by each number card (sets of 4). Discussed with patient and wife in terms of how this relates to communication with patient and how he responds best in situations given his aphasia.    Response to tx: Patient continues to demonstrate limitations due to aphasia and wife was asking more about patient having  difficulty with identifying body parts and that he appears not to know what she was asking although in other situations he appeared to have receptive language intact.  Pt responds best to instructions when providing demonstration, mirroring at times, along with repetition and select verbal cues. Continue to work towards goals to improve independence in daily tasks.                      OT Education - 08/07/20 1802    Education provided Yes    Education Details identification of body parts from word list, card scanning    Person(s) Educated Patient;Spouse    Methods Explanation;Demonstration;Verbal cues    Comprehension Verbalized understanding;Returned demonstration;Verbal cues required               OT Long Term Goals - 07/02/20 0941      OT LONG TERM GOAL #1   Title Patient will  demonstrate use of utensil in right hand for self feeding with modified independence with little to no spillage.     Baseline unable at eval to hold utensil, 10th visit holding utensil, some feeding. 20th visit, feeding self consistently but still has some moderate spillage    Time 12    Period Weeks    Status Achieved    Target Date 09/21/20      OT LONG TERM GOAL #2   Title Pt will increase hand strength in right by 5# to be able to cut food with modified independence.    Baseline unable to cut meat at evaluation, 10th visit starting to work on cutting meat, patient reports difficulty with cutting meat. 12/6 still has difficulty with cutting meat unless it does not require a knife    Time 12    Period Weeks    Status On-going    Target Date 09/21/20      OT LONG TERM GOAL #3   Title Patient will demonstrate increased awareness and strategies to attend to right side to hold items without dropping items.    Baseline difficulty with detecting items in right hand at eval, forgets items are in hand, 10th visit improved but still working on 12-30-2019: minimal dropping of items this date.  20th visit continued dropping of items occasionally but with increased awareness.  Update:  remains inconsistent but does well with repeated cues    Time 12    Period Weeks    Status On-going    Target Date 09/21/20      OT LONG TERM GOAL #4   Title Patient will demonstrate independence in home exercise program for ROM, strength and coordination of right hand.    Baseline eval-patient has not been performing exercises from last episode of care and demonstrates decline, 10th visit doing exercises at home and continue to modify program. 20th visit pt continues to engage in HEP with upgrades frequently    Time 12    Period Weeks    Status Partially Met    Target Date 09/21/20      OT LONG TERM GOAL #5   Title Pt to demonstrate improved balance with transitional movements with sit to stand from all surfaces  with no loss of balance to reduce fall risk. 12/6: Pt impulsive with transitional movements and remains at risk for falls    Time 12    Period Weeks    Status On-going    Target Date 09/21/20      OT LONG TERM GOAL #6   Title  Patient will demonstrate pouring a drink with the right hand with attention to right side and not knocking the cup over and spilling.      Baseline unable at eval, 10th visit starting to use right hand more and attempting to pour with right.  20th visit still has difficulty at times.  12/6: frequency is less for this but has occasional accidents with spilling if not focused on task.    Time 12    Period Weeks    Status On-going    Target Date 09/21/20                 Plan - 08/07/20 1803    Clinical Impression Statement Patient continues to demonstrate limitations due to aphasia and wife was asking more about patient having difficulty with identifying body parts and that he appears not to know what she was asking although in other situations he appeared to have receptive language intact.  Pt responds best to instructions when providing demonstration, mirroring at times, along with repetition and select verbal cues. Continue to work towards goals to improve independence in daily tasks.    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    Occupational performance deficits (Please refer to evaluation for details): ADL's;IADL's;Leisure    Body Structure / Function / Physical Skills ADL;Dexterity;Flexibility;ROM;Strength;Vision;Balance;Coordination;FMC;IADL;Tone;Endurance;UE functional use;Mobility    Cognitive Skills Attention;Safety  Awareness;Problem Solve    Psychosocial Skills Environmental  Adaptations;Routines and Behaviors;Habits;Interpersonal Interaction    Rehab Potential Good    Clinical Decision Making Several treatment options, min-mod task modification necessary    Comorbidities Affecting Occupational Performance: May have comorbidities impacting occupational performance    Modification or Assistance to Complete Evaluation  Min-Moderate modification of tasks or assist with assess necessary to complete eval    OT Frequency 1x / week    OT Duration 12 weeks    OT Treatment/Interventions Self-care/ADL training;Visual/perceptual remediation/compensation;DME and/or AE instruction;Patient/family education;Moist Heat;Therapeutic exercise;Manual Therapy;Therapeutic activities;Neuromuscular education    Consulted and Agree with Plan of Care Patient    Family Member Consulted wife, [name]           Patient will benefit from skilled therapeutic intervention in order to improve the following deficits and impairments:   Body Structure / Function / Physical Skills: ADL,Dexterity,Flexibility,ROM,Strength,Vision,Balance,Coordination,FMC,IADL,Tone,Endurance,UE functional use,Mobility Cognitive Skills: Attention,Safety Awareness,Problem Solve Psychosocial Skills: Environmental  Technical sales engineer   Visit Diagnosis: Muscle weakness (generalized)  Other lack of coordination  Neurologic neglect syndrome  Unsteadiness on feet  Difficulty in walking, not elsewhere classified    Problem List Patient Active Problem List   Diagnosis Date Noted  . Sleep disturbance   . PAF (paroxysmal atrial fibrillation) ([place])   . Recurrent UTI   . Hypokalemia   . Hyponatremia   . Abnormal urine   . 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 Benign essential HTN   . Urinary retention   . History of gout   . Global aphasia   . SAH (subarachnoid hemorrhage) ([place])   .  SDH (subdural hematoma) ([place])   . Lymphocytosis   . Subdural hemorrhage following injury ([place]) 05/12/2016   Undra Trembath T Rawan Riendeau, OTR/L, CLT  Alto Gandolfo 08/07/2020, 9:56 PM  [place] MAIN  [place], [place], [address] Phone: [phone]   Fax:  [phone]  Name: Zamire Whitehurst MRN: [id number] Date of Birth: 1940-11-17

## 2020-08-10 ENCOUNTER — Ambulatory Visit: Payer: PPO | Admitting: Occupational Therapy

## 2020-08-12 ENCOUNTER — Ambulatory Visit: Payer: PPO | Admitting: Occupational Therapy

## 2020-08-17 ENCOUNTER — Other Ambulatory Visit: Payer: Self-pay

## 2020-08-17 ENCOUNTER — Encounter: Payer: Self-pay | Admitting: Occupational Therapy

## 2020-08-17 ENCOUNTER — Ambulatory Visit: Payer: PPO | Admitting: Occupational Therapy

## 2020-08-17 DIAGNOSIS — M6281 Muscle weakness (generalized): Secondary | ICD-10-CM

## 2020-08-17 DIAGNOSIS — R2681 Unsteadiness on feet: Secondary | ICD-10-CM

## 2020-08-17 DIAGNOSIS — R278 Other lack of coordination: Secondary | ICD-10-CM

## 2020-08-17 DIAGNOSIS — R414 Neurologic neglect syndrome: Secondary | ICD-10-CM

## 2020-08-17 NOTE — Therapy (Signed)
[place] MAIN [facility] SERVICES [address] [place], [place], [address] Phone: [phone]   Fax:  [phone]  Occupational Therapy Treatment  Patient Details  Name: James [name] MRN: [id number] Date of Birth: [birth year] Referring Provider (OT): Joselyn Arrow   Encounter Date: 08/17/2020   OT End of Session - 08/17/20 1104    Visit Number 61   Number of Visits 72    Date for OT Re-Evaluation 09/21/20    OT Start Time 1055    OT Stop Time 1046    OT Time Calculation (min) 1431 min    Activity Tolerance Patient tolerated treatment well    Behavior During Therapy [facility] for tasks assessed/performed           Past Medical History:  Diagnosis Date  . Alcohol abuse, in remission   . Atrial fibrillation ([place])   . Bilateral renal masses   . Closed right ankle fracture   . 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 Gait disorder   . Gout   . Hypertension   . Seizure disorder ([place])   . 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     Past Surgical History:  Procedure Laterality Date  . CRANIECTOMY FOR DEPRESSED SKULL FRACTURE  2009   with MRSA infection  . CRANIOTOMY     X 2  . HERNIA REPAIR     during childhood  . ORIF ANKLE FRACTURE Left 2012    There were no vitals filed for this visit.   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  He suffered another fall in 2017.  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     Patient Stated Goals Patient indicates he wants to be stronger, use his right hand for daily tasks, be as independent as possible.           Therapeutic Exercises: Pt seen this date for strengthening tasks, UBE from seated position with resistance from 3.0 to 3.5 for 6 mins, 3 min forwards,  3 mins backwards and therapist in constant attendance to adjust settings and to ensure grip on the right side.    Multi directional reach from sitting and standing to obtain objects from a variety of planes of motion.    Therapeutic Activities: Pt seen for right sided awareness activities with use of 48 piece wooden puzzle.  Patient preferred to sort pieces in similar color patterns, did not start with outside/straight edge pieces.  Moderate difficulty with completing puzzle, would often try a piece but couldn't always discern if it was the right fit or not.  Therapist provided verbal cues and assistance at times since pt tends to get frustrated when tasks are more difficult.    Response to tx:  Provided assist with puzzle today, shaping task for success.  When task is too difficult, pt will tend to get frustrated and then doesn't want to continue to come to therapy the next session.  When patient is able to complete a task with some assistance he indicates motivation to keep working.  48 piece puzzle a challenge this date but was very focused on task and had some success with finding pieces to place together.  1-2 cues to attend to pieces on his right  side this date and moderate cues to utilize right hand to pick up and manage puzzle pieces.  Continue to work towards goals to improve right side attention, functional hand use for daily tasks.                     OT Education - 08/21/20 [phone]    Education provided Yes    Education Details fine motor coordination, right side awareness    Person(s) Educated Patient;Spouse    Methods Explanation;Demonstration;Verbal cues    Comprehension Verbalized understanding;Returned demonstration;Verbal cues required               OT Long Term Goals - 07/02/20 0941      OT LONG TERM GOAL #1   Title Patient will demonstrate use of utensil in right hand for self feeding with modified independence with little to no spillage.     Baseline  unable at eval to hold utensil, 10th visit holding utensil, some feeding. 20th visit, feeding self consistently but still has some moderate spillage    Time 12    Period Weeks    Status Achieved    Target Date 09/21/20      OT LONG TERM GOAL #2   Title Pt will increase hand strength in right by 5# to be able to cut food with modified independence.    Baseline unable to cut meat at evaluation, 10th visit starting to work on cutting meat, patient reports difficulty with cutting meat. 12/6 still has difficulty with cutting meat unless it does not require a knife    Time 12    Period Weeks    Status On-going    Target Date 09/21/20      OT LONG TERM GOAL #3   Title Patient will demonstrate increased awareness and strategies to attend to right side to hold items without dropping items.    Baseline difficulty with detecting items in right hand at eval, forgets items are in hand, 10th visit improved but still working on 12-30-2019: minimal dropping of items this date.  20th visit continued dropping of items occasionally but with increased awareness.  Update:  remains inconsistent but does well with repeated cues    Time 12    Period Weeks    Status On-going    Target Date 09/21/20      OT LONG TERM GOAL #4   Title Patient will demonstrate independence in home exercise program for ROM, strength and coordination of right hand.    Baseline eval-patient has not been performing exercises from last episode of care and demonstrates decline, 10th visit doing exercises at home and continue to modify program. 20th visit pt continues to engage in HEP with upgrades frequently    Time 12    Period Weeks    Status Partially Met    Target Date 09/21/20      OT LONG TERM GOAL #5   Title Pt to demonstrate improved balance with transitional movements with sit to stand from all surfaces with no loss of balance to reduce fall risk. 12/6: Pt impulsive with transitional movements and remains at risk for falls     Time 12    Period Weeks    Status On-going    Target Date 09/21/20      OT LONG TERM GOAL #6   Title Patient will demonstrate pouring a drink with the right hand with attention to right side and not knocking the cup over and spilling.  Baseline unable at eval, 10th visit starting to use right hand more and attempting to pour with right.  20th visit still has difficulty at times.  12/6: frequency is less for this but has occasional accidents with spilling if not focused on task.    Time 12    Period Weeks    Status On-going    Target Date 09/21/20                 Plan - 08/21/20 0834    Clinical Impression Statement Provided assist with puzzle today, shaping task for success.  When task is too difficult, pt will tend to get frustrated and then doesn't want to continue to come to therapy the next session.  When patient is able to complete a task with some assistance he indicates motivation to keep working.  48 piece puzzle a challenge this date but was very focused on task and had some success with finding pieces to place together.  1-2 cues to attend to pieces on his right side this date and moderate cues to utilize right hand to pick up and manage puzzle pieces.  Continue to work towards goals to improve right side attention, functional hand use for daily tasks.    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    Occupational performance deficits (Please refer to evaluation for details): ADL's;IADL's;Leisure    Body Structure / Function / Physical Skills ADL;Dexterity;Flexibility;ROM;Strength;Vision;Balance;Coordination;FMC;IADL;Tone;Endurance;UE functional  use;Mobility    Cognitive Skills Attention;Safety Awareness;Problem Solve    Psychosocial Skills Environmental  Adaptations;Routines and Behaviors;Habits;Interpersonal Interaction    Rehab Potential Good    Clinical Decision Making Several treatment options, min-mod task modification necessary    Comorbidities Affecting Occupational Performance: May have comorbidities impacting occupational performance    Modification or Assistance to Complete Evaluation  Min-Moderate modification of tasks or assist with assess necessary to complete eval    OT Frequency 1x / week    OT Duration 12 weeks    OT Treatment/Interventions Self-care/ADL training;Visual/perceptual remediation/compensation;DME and/or AE instruction;Patient/family education;Moist Heat;Therapeutic exercise;Manual Therapy;Therapeutic activities;Neuromuscular education    Consulted and Agree with Plan of Care Patient    Family Member Consulted wife, [name]           Patient will benefit from skilled therapeutic intervention in order to improve the following deficits and impairments:   Body Structure / Function / Physical Skills: ADL,Dexterity,Flexibility,ROM,Strength,Vision,Balance,Coordination,FMC,IADL,Tone,Endurance,UE functional use,Mobility Cognitive Skills: Attention,Safety Awareness,Problem Solve Psychosocial Skills: Environmental  Technical sales engineer   Visit Diagnosis: Other lack of coordination  Muscle weakness (generalized)  Neurologic neglect syndrome  Unsteadiness on feet    Problem List Patient Active Problem List   Diagnosis Date Noted  . Sleep disturbance   . PAF (paroxysmal atrial fibrillation) ([place])   . Recurrent UTI   . Hypokalemia   . Hyponatremia   . Abnormal urine   . 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 Benign essential HTN   . Urinary retention   . History of gout   . Global aphasia   . SAH (subarachnoid hemorrhage) ([place])   .  SDH (subdural hematoma) ([place])   . Lymphocytosis   . Subdural hemorrhage following injury ([place]) 05/12/2016   James [name], OTR/L, CLT  James [name] 08/21/2020, 8:43 AM  [place] MAIN [facility] SERVICES [address] [place], [place], [address] Phone: [phone]   Fax:  [phone]  Name: James [name] MRN: [id number] Date of Birth: 03-01-1941

## 2020-08-19 ENCOUNTER — Ambulatory Visit: Payer: PPO | Admitting: Occupational Therapy

## 2020-08-24 DIAGNOSIS — S06301D Unspecified focal traumatic brain injury with loss of consciousness of 30 minutes or less, subsequent encounter: Secondary | ICD-10-CM | POA: Diagnosis not present

## 2020-08-24 DIAGNOSIS — R278 Other lack of coordination: Secondary | ICD-10-CM | POA: Diagnosis not present

## 2020-08-24 DIAGNOSIS — I69320 Aphasia following cerebral infarction: Secondary | ICD-10-CM | POA: Diagnosis not present

## 2020-08-27 ENCOUNTER — Other Ambulatory Visit: Payer: Self-pay

## 2020-08-27 ENCOUNTER — Ambulatory Visit: Payer: PPO | Attending: Neurology | Admitting: Occupational Therapy

## 2020-08-27 ENCOUNTER — Encounter: Payer: Self-pay | Admitting: Occupational Therapy

## 2020-08-27 DIAGNOSIS — R414 Neurologic neglect syndrome: Secondary | ICD-10-CM | POA: Insufficient documentation

## 2020-08-27 DIAGNOSIS — M6281 Muscle weakness (generalized): Secondary | ICD-10-CM | POA: Diagnosis not present

## 2020-08-27 DIAGNOSIS — R262 Difficulty in walking, not elsewhere classified: Secondary | ICD-10-CM | POA: Diagnosis not present

## 2020-08-27 DIAGNOSIS — R2681 Unsteadiness on feet: Secondary | ICD-10-CM | POA: Diagnosis not present

## 2020-08-27 DIAGNOSIS — R278 Other lack of coordination: Secondary | ICD-10-CM | POA: Diagnosis not present

## 2020-08-27 NOTE — Therapy (Signed)
[place] MAIN [facility] SERVICES [address] [place], [place], [address] Phone: [phone]   Fax:  [phone]  Occupational Therapy Treatment  Patient Details  Name: James [name] MRN: [id number] Date of Birth: [birth year] Referring Provider (OT): Joselyn Arrow   Encounter Date: 08/27/2020   OT End of Session - 08/27/20 1108    Visit Number [id number]    Number of Visits 72    Date for OT Re-Evaluation 09/21/20    OT Start Time 1101    OT Stop Time 1150    OT Time Calculation (min) 49 min    Activity Tolerance Patient tolerated treatment well    Behavior During Therapy [facility] for tasks assessed/performed           Past Medical History:  Diagnosis Date  . Alcohol abuse, in remission   . Atrial fibrillation ([place])   . Bilateral renal masses   . Closed right ankle fracture   . 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 Gait disorder   . Gout   . Hypertension   . Seizure disorder ([place])   . 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     Past Surgical History:  Procedure Laterality Date  . CRANIECTOMY FOR DEPRESSED SKULL FRACTURE  2009   with MRSA infection  . CRANIOTOMY     X 2  . HERNIA REPAIR     during childhood  . ORIF ANKLE FRACTURE Left 2012    There were no vitals filed for this visit.   Subjective Assessment - 08/27/20 1107    Subjective  Pt smiling, wife reports patient has been talking in the waiting room since he got here.    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  He suffered another fall in 2017.  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     Patient Stated Goals Patient indicates he wants to be stronger, use his right hand for daily tasks, be as independent as possible.    Currently in Pain? No/denies    Multiple Pain Sites No           Neuromuscular Reeducation/Coordination Skills:  Patient seen for bilateral hand use tasks with use of tools (wrench, hex tool) to perform large nuts/bolts on tool board.  Patient performing 2 rows of each type/size.  Cues for proper hand placement on tool, occasional assist to position hex tool.  Using primarily left hand to manipulate nut underneath while using right hand for stabilizer and tool use.   When replacing, cues to put all the same type of bolts into the same row.  Therapist correcting as needed.   Unknotting medium sized rope with use of bilateral UEs but attempting to use right hand more as the lead in the task.  Therapist demo and cues provided. Difficulty with knots that are tied a bit tighter.  Response to tx:  Pt initiating use of right hand more today since most tasks performed were tasks which require bilateral hand use.  Using right hand more as a support, stabilizer and is able to hold a variety of style wrenches/tools with the right hand.  Tends to use the tool to hold the bolt while using left hand to unscrew the nut.  Difficulty at times with turning and manipulating hand tool.  Performing task quicker than in the past, able to get an increased number of nuts/bolts assembled/disassembled.  Some difficulty with tighter knots in rope.  Continue OT to increase right hand use in functional daily tasks, continue to work towards more spontaneous use and initiation of right hand use.                      OT Education - 08/27/20 1108    Education provided Yes    Education Details coordination with use of tools and larger nuts and bolts    Person(s) Educated Patient;Spouse    Methods Explanation;Demonstration;Verbal cues    Comprehension Verbalized understanding;Returned demonstration;Verbal cues required               OT Long Term Goals - 07/02/20 0941      OT LONG TERM GOAL #1   Title Patient will demonstrate use of utensil in right hand for  self feeding with modified independence with little to no spillage.     Baseline unable at eval to hold utensil, 10th visit holding utensil, some feeding. 20th visit, feeding self consistently but still has some moderate spillage    Time 12    Period Weeks    Status Achieved    Target Date 09/21/20      OT LONG TERM GOAL #2   Title Pt will increase hand strength in right by 5# to be able to cut food with modified independence.    Baseline unable to cut meat at evaluation, 10th visit starting to work on cutting meat, patient reports difficulty with cutting meat. 12/6 still has difficulty with cutting meat unless it does not require a knife    Time 12    Period Weeks    Status On-going    Target Date 09/21/20      OT LONG TERM GOAL #3   Title Patient will demonstrate increased awareness and strategies to attend to right side to hold items without dropping items.    Baseline difficulty with detecting items in right hand at eval, forgets items are in hand, 10th visit improved but still working on 12-30-2019: minimal dropping of items this date.  20th visit continued dropping of items occasionally but with increased awareness.  Update:  remains inconsistent but does well with repeated cues    Time 12    Period Weeks    Status On-going    Target Date 09/21/20      OT LONG TERM GOAL #4   Title Patient will demonstrate independence in home exercise program for ROM, strength and coordination of right hand.    Baseline eval-patient has not been performing exercises from last episode of care and demonstrates decline, 10th visit doing exercises at home and continue to modify program. 20th visit pt continues to engage in HEP with upgrades frequently    Time 12    Period Weeks    Status Partially Met    Target Date 09/21/20      OT LONG TERM GOAL #5   Title Pt to demonstrate improved balance with transitional movements with sit to stand from all surfaces with no loss of balance to reduce fall risk.  12/6: Pt impulsive with transitional movements and remains at risk for falls    Time 12    Period Weeks    Status On-going    Target Date 09/21/20      OT LONG TERM GOAL #6   Title Patient will demonstrate pouring a drink with  the right hand with attention to right side and not knocking the cup over and spilling.      Baseline unable at eval, 10th visit starting to use right hand more and attempting to pour with right.  20th visit still has difficulty at times.  12/6: frequency is less for this but has occasional accidents with spilling if not focused on task.    Time 12    Period Weeks    Status On-going    Target Date 09/21/20                 Plan - 08/27/20 1109    Clinical Impression Statement Pt initiating use of right hand more today since most tasks performed were tasks which require bilateral hand use.  Using right hand more as a support, stabilizer and is able to hold a variety of style wrenches/tools with the right hand.  Tends to use the tool to hold the bolt while using left hand to unscrew the nut.  Difficulty at times with turning and manipulating hand tool.  Performing task quicker than in the past, able to get an increased number of nuts/bolts assembled/disassembled.  Some difficulty with tighter knots in rope.  Continue OT to increase right hand use in functional daily tasks, continue to work towards more spontaneous use and initiation of right hand use.    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    Occupational performance deficits (Please refer to evaluation for details): ADL's;IADL's;Leisure    Body Structure / Function / Physical  Skills ADL;Dexterity;Flexibility;ROM;Strength;Vision;Balance;Coordination;FMC;IADL;Tone;Endurance;UE functional use;Mobility    Cognitive Skills Attention;Safety Awareness;Problem Solve    Psychosocial Skills Environmental  Adaptations;Routines and Behaviors;Habits;Interpersonal Interaction    Rehab Potential Good    Clinical Decision Making Several treatment options, min-mod task modification necessary    Comorbidities Affecting Occupational Performance: May have comorbidities impacting occupational performance    Modification or Assistance to Complete Evaluation  Min-Moderate modification of tasks or assist with assess necessary to complete eval    OT Frequency 1x / week    OT Duration 12 weeks    OT Treatment/Interventions Self-care/ADL training;Visual/perceptual remediation/compensation;DME and/or AE instruction;Patient/family education;Moist Heat;Therapeutic exercise;Manual Therapy;Therapeutic activities;Neuromuscular education    Consulted and Agree with Plan of Care Patient    Family Member Consulted wife, [name]           Patient will benefit from skilled therapeutic intervention in order to improve the following deficits and impairments:   Body Structure / Function / Physical Skills: ADL,Dexterity,Flexibility,ROM,Strength,Vision,Balance,Coordination,FMC,IADL,Tone,Endurance,UE functional use,Mobility Cognitive Skills: Attention,Safety Awareness,Problem Solve Psychosocial Skills: Environmental  Technical sales engineer   Visit Diagnosis: Muscle weakness (generalized)  Neurologic neglect syndrome  Other lack of coordination    Problem List Patient Active Problem List   Diagnosis Date Noted  . Sleep disturbance   . PAF (paroxysmal atrial fibrillation) ([place])   . Recurrent UTI   . Hypokalemia   . Hyponatremia   . Abnormal urine   . 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 Benign essential HTN   . Urinary retention    . History of gout   . Global aphasia   . SAH (subarachnoid hemorrhage) ([place])   . SDH (subdural hematoma) ([place])   . Lymphocytosis   .  Subdural hemorrhage following injury [facility]) 05/12/2016   Paiden Caraveo T Shenise Wolgamott, OTR/L, CLT  Tomika Eckles 08/27/2020, 11:54 AM  [place] [address] [place], [place], [address] Phone: [phone]   Fax:  [phone]  Name: James [name] MRN: [id number] Date of Birth: Apr 14, 1941

## 2020-09-02 ENCOUNTER — Encounter: Payer: PPO | Admitting: Occupational Therapy

## 2020-09-03 ENCOUNTER — Other Ambulatory Visit: Payer: Self-pay

## 2020-09-03 ENCOUNTER — Ambulatory Visit: Payer: PPO | Admitting: Occupational Therapy

## 2020-09-03 ENCOUNTER — Encounter: Payer: Self-pay | Admitting: Occupational Therapy

## 2020-09-03 DIAGNOSIS — R414 Neurologic neglect syndrome: Secondary | ICD-10-CM

## 2020-09-03 DIAGNOSIS — M6281 Muscle weakness (generalized): Secondary | ICD-10-CM

## 2020-09-03 DIAGNOSIS — R278 Other lack of coordination: Secondary | ICD-10-CM

## 2020-09-03 NOTE — Therapy (Signed)
[place] MAIN [facility] SERVICES [address] [place], [place], [address] Phone: [phone]   Fax:  [phone]  Occupational Therapy Treatment   Patient Details  Name: James [name] MRN: [id number] Date of Birth: 1940/10/08 Referring Provider (OT): Joselyn Arrow   Encounter Date: 09/03/2020   OT End of Session - 09/04/20 2037    Visit Number 57    Number of Visits 72    Date for OT Re-Evaluation 09/21/20    OT Start Time 1015    OT Stop Time 1100    OT Time Calculation (min) 45 min    Activity Tolerance Patient tolerated treatment well    Behavior During Therapy [facility] for tasks assessed/performed           Past Medical History:  Diagnosis Date  . Alcohol abuse, in remission   . Atrial fibrillation ([place])   . Bilateral renal masses   . Closed right ankle fracture   . 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 Gait disorder   . Gout   . Hypertension   . Seizure disorder ([place])   . 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     Past Surgical History:  Procedure Laterality Date  . CRANIECTOMY FOR DEPRESSED SKULL FRACTURE  2009   with MRSA infection  . CRANIOTOMY     X 2  . HERNIA REPAIR     during childhood  . ORIF ANKLE FRACTURE Left 2012    There were no vitals filed for this visit.   Subjective Assessment - 09/04/20 2037    Subjective  Wife reports things are going well, going to a new restaurant today.    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  He suffered another fall in 2017.  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     Patient Stated Goals Patient indicates he wants to be stronger, use his right hand for daily tasks, be as independent as possible.    Currently in Pain? No/denies    Multiple Pain Sites No            ADL: Patient seen this date for kitchen tasks, patient carrying items from 1 counter top to the table and carrying bag from one area to another in the gym.  Patient working towards opening a variety of packages and emptying in out contents into bowls and containers.  Patient demonstrates difficulty with Specific containers which have plastic around them to seal the package.  Required assist from therapist with demonstration and occasional hand over hand assist to open container.  Patient working towards wiping countertops and cleaning area by placing items and trash can.  All tasks performed with supervision for functional mobility around the kitchen area.   Neuromuscular Reeducation: Manipulation of coins this date from a seated position with right hand, cues for prehension patterns to pick up flat objects from tabletop to place into resistive bank.  Patient demonstrates difficulties at times getting coin into the specific slot and occasionally requires cues.  Response to tx: Patient following commands consistently with kitchen activities this date.  Difficulty with opening containers with plastic sealed edges and required assist from therapist.  Patient performed well with carrying items from 1 area to another and moving items around the kitchen with supervision.  Patient performed well with pouring items into containers/bowls.  Continue to work towards goals to maximize safety and independence with ADL and IADL tasks at home and in the community.                OT Education - 09/04/20 2037    Education provided Yes    Education Details functional use of right hand    Person(s) Educated Patient;Spouse    Methods Explanation;Demonstration;Verbal cues    Comprehension Verbalized understanding;Returned demonstration;Verbal cues required               OT Long Term Goals - 07/02/20 0941      OT LONG TERM GOAL #1   Title Patient will demonstrate use of utensil in right hand  for self feeding with modified independence with little to no spillage.     Baseline unable at eval to hold utensil, 10th visit holding utensil, some feeding. 20th visit, feeding self consistently but still has some moderate spillage    Time 12    Period Weeks    Status Achieved    Target Date 09/21/20      OT LONG TERM GOAL #2   Title Pt will increase hand strength in right by 5# to be able to cut food with modified independence.    Baseline unable to cut meat at evaluation, 10th visit starting to work on cutting meat, patient reports difficulty with cutting meat. 12/6 still has difficulty with cutting meat unless it does not require a knife    Time 12    Period Weeks    Status On-going    Target Date 09/21/20      OT LONG TERM GOAL #3   Title Patient will demonstrate increased awareness and strategies to attend to right side to hold items without dropping items.    Baseline difficulty with detecting items in right hand at eval, forgets items are in hand, 10th visit improved but still working on 12-30-2019: minimal dropping of items this date.  20th visit continued dropping of items occasionally but with increased awareness.  Update:  remains inconsistent but does well with repeated cues    Time 12    Period Weeks    Status On-going    Target Date 09/21/20      OT LONG TERM GOAL #4   Title Patient will demonstrate independence in home exercise program for ROM, strength and coordination of right hand.    Baseline eval-patient has not been performing exercises from last episode of care and demonstrates decline, 10th visit doing exercises at home and continue to modify program. 20th visit pt continues to engage in HEP with upgrades frequently    Time 12    Period Weeks    Status Partially Met    Target Date 09/21/20      OT LONG TERM GOAL #5   Title Pt to demonstrate improved balance with transitional movements with sit to stand from all surfaces with no loss of balance to reduce fall  risk. 12/6: Pt impulsive with transitional movements and remains at risk for falls    Time 12    Period Weeks    Status On-going    Target Date 09/21/20      OT LONG TERM GOAL #6   Title Patient will demonstrate pouring a drink with the right hand with attention to right side and not knocking the cup over and spilling.      Baseline unable at eval, 10th visit starting to use  right hand more and attempting to pour with right.  20th visit still has difficulty at times.  12/6: frequency is less for this but has occasional accidents with spilling if not focused on task.    Time 12    Period Weeks    Status On-going    Target Date 09/21/20                 Plan - 09/04/20 2038    Clinical Impression Statement Patient following commands consistently with kitchen activities this date.  Difficulty with opening containers with plastic sealed edges and required assist from therapist.  Patient performed well with carrying items from 1 area to another and moving items around the kitchen with supervision.  Patient performed well with pouring items into containers/bowls.  Continue to work towards goals to maximize safety and independence with ADL and IADL tasks at home and in the community.    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    Occupational performance deficits (Please refer to evaluation for details): ADL's;IADL's;Leisure    Body Structure / Function / Physical Skills ADL;Dexterity;Flexibility;ROM;Strength;Vision;Balance;Coordination;FMC;IADL;Tone;Endurance;UE functional use;Mobility    Cognitive Skills Attention;Safety Awareness;Problem Solve    Psychosocial  Skills Environmental  Adaptations;Routines and Behaviors;Habits;Interpersonal Interaction    Rehab Potential Good    Clinical Decision Making Several treatment options, min-mod task modification necessary    Comorbidities Affecting Occupational Performance: May have comorbidities impacting occupational performance    Modification or Assistance to Complete Evaluation  Min-Moderate modification of tasks or assist with assess necessary to complete eval    OT Frequency 1x / week    OT Duration 12 weeks    OT Treatment/Interventions Self-care/ADL training;Visual/perceptual remediation/compensation;DME and/or AE instruction;Patient/family education;Moist Heat;Therapeutic exercise;Manual Therapy;Therapeutic activities;Neuromuscular education    Consulted and Agree with Plan of Care Patient    Family Member Consulted wife, [name]           Patient will benefit from skilled therapeutic intervention in order to improve the following deficits and impairments:   Body Structure / Function / Physical Skills: ADL,Dexterity,Flexibility,ROM,Strength,Vision,Balance,Coordination,FMC,IADL,Tone,Endurance,UE functional use,Mobility Cognitive Skills: Attention,Safety Awareness,Problem Solve Psychosocial Skills: Environmental  Technical sales engineer   Visit Diagnosis: Muscle weakness (generalized)  Other lack of coordination  Neurologic neglect syndrome    Problem List Patient Active Problem List   Diagnosis Date Noted  . Sleep disturbance   . PAF (paroxysmal atrial fibrillation) ([place])   . Recurrent UTI   . Hypokalemia   . Hyponatremia   . Abnormal urine   . 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 Benign essential HTN   . Urinary retention   . History of gout   . Global aphasia   . SAH (subarachnoid hemorrhage) ([place])   . SDH (subdural hematoma) ([place])   . Lymphocytosis   . Subdural hemorrhage following injury [facility]) 05/12/2016   Araiya Tilmon T  Valerie Cones, OTR/L, CLT  Nayden Czajka 09/05/2020, 9:43 PM  [place] MAIN [facility] SERVICES [address] [place], [place], [address] Phone: [phone]   Fax:  [phone]  Name: James [name] MRN: [id number] Date of Birth: 09/27/1940

## 2020-09-07 ENCOUNTER — Other Ambulatory Visit: Payer: Self-pay

## 2020-09-07 ENCOUNTER — Ambulatory Visit: Payer: PPO | Admitting: Occupational Therapy

## 2020-09-07 DIAGNOSIS — M6281 Muscle weakness (generalized): Secondary | ICD-10-CM

## 2020-09-07 DIAGNOSIS — R414 Neurologic neglect syndrome: Secondary | ICD-10-CM

## 2020-09-07 DIAGNOSIS — R278 Other lack of coordination: Secondary | ICD-10-CM

## 2020-09-07 DIAGNOSIS — R262 Difficulty in walking, not elsewhere classified: Secondary | ICD-10-CM

## 2020-09-07 DIAGNOSIS — R2681 Unsteadiness on feet: Secondary | ICD-10-CM

## 2020-09-08 ENCOUNTER — Encounter: Payer: Self-pay | Admitting: Occupational Therapy

## 2020-09-08 NOTE — Therapy (Signed)
[place] MAIN [facility] SERVICES [address] [place], [place], [address] Phone: [phone]   Fax:  [phone]  Occupational Therapy Treatment  Patient Details  Name: James [name] MRN: [id number] Date of Birth: 1940/08/20 Referring Provider (OT): Joselyn Arrow   Encounter Date: 09/07/2020   OT End of Session - 09/08/20 1504    Visit Number [id number]    Number of Visits 72    Date for OT Re-Evaluation 09/21/20    OT Start Time 1100    OT Stop Time 1150    OT Time Calculation (min) 50 min    Activity Tolerance Patient tolerated treatment well    Behavior During Therapy [facility] for tasks assessed/performed           Past Medical History:  Diagnosis Date  . Alcohol abuse, in remission   . Atrial fibrillation ([place])   . Bilateral renal masses   . Closed right ankle fracture   . 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 Gait disorder   . Gout   . Hypertension   . Seizure disorder ([place])   . 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     Past Surgical History:  Procedure Laterality Date  . CRANIECTOMY FOR DEPRESSED SKULL FRACTURE  2009   with MRSA infection  . CRANIOTOMY     X 2  . HERNIA REPAIR     during childhood  . ORIF ANKLE FRACTURE Left 2012    There were no vitals filed for this visit.   Subjective Assessment - 09/08/20 1503    Subjective  Pt brought in a [place] card he wrote in himself to sign his name with good legibility.    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  He suffered another fall in 2017.  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     Patient Stated Goals Patient indicates he wants to be stronger, use his right hand for daily tasks, be as independent as possible.    Currently in Pain? No/denies    Multiple Pain Sites No            Neuromuscular Reeducation: Patient seen for manipulation of playing cards to shuffle, sort and place into scatter pile then working towards picking up cards from flat surface with use of right hand.  Difficulty noted with picking up from flat surface and often required sliding object to the edge to pick up between fingers.  Manipulation of pieces from judy board with emphasis on use of right hand and prehension patterns to pick up and place into large board following specific design.  Patient required cues on 2 occasions for items miss placed on to board, patient able to respond to correct items appropriately.  Response to treatment: Patient continues to demonstrate difficulty with in hand manipulation skills with right hand during picking up and moving items.  Patient able to transfer item to board if therapist hands him the item, but has more difficulty if picking up from the table or container.  Continue to work towards manipulation skills for greater coordination with items at home.  Continue OT to maximize safety and independence with necessary daily tasks.                     OT  Education - 09/08/20 1503    Education provided Yes    Education Details functional use of right hand    Person(s) Educated Patient;Spouse    Methods Explanation;Demonstration;Verbal cues    Comprehension Verbalized understanding;Returned demonstration;Verbal cues required               OT Long Term Goals - 07/02/20 0941      OT LONG TERM GOAL #1   Title Patient will demonstrate use of utensil in right hand for self feeding with modified independence with little to no spillage.     Baseline unable at eval to hold utensil, 10th visit holding utensil, some feeding. 20th visit, feeding self consistently but still has some moderate spillage    Time 12    Period Weeks    Status Achieved    Target Date 09/21/20      OT LONG TERM GOAL #2   Title Pt will increase hand strength in  right by 5# to be able to cut food with modified independence.    Baseline unable to cut meat at evaluation, 10th visit starting to work on cutting meat, patient reports difficulty with cutting meat. 12/6 still has difficulty with cutting meat unless it does not require a knife    Time 12    Period Weeks    Status On-going    Target Date 09/21/20      OT LONG TERM GOAL #3   Title Patient will demonstrate increased awareness and strategies to attend to right side to hold items without dropping items.    Baseline difficulty with detecting items in right hand at eval, forgets items are in hand, 10th visit improved but still working on 12-30-2019: minimal dropping of items this date.  20th visit continued dropping of items occasionally but with increased awareness.  Update:  remains inconsistent but does well with repeated cues    Time 12    Period Weeks    Status On-going    Target Date 09/21/20      OT LONG TERM GOAL #4   Title Patient will demonstrate independence in home exercise program for ROM, strength and coordination of right hand.    Baseline eval-patient has not been performing exercises from last episode of care and demonstrates decline, 10th visit doing exercises at home and continue to modify program. 20th visit pt continues to engage in HEP with upgrades frequently    Time 12    Period Weeks    Status Partially Met    Target Date 09/21/20      OT LONG TERM GOAL #5   Title Pt to demonstrate improved balance with transitional movements with sit to stand from all surfaces with no loss of balance to reduce fall risk. 12/6: Pt impulsive with transitional movements and remains at risk for falls    Time 12    Period Weeks    Status On-going    Target Date 09/21/20      OT LONG TERM GOAL #6   Title Patient will demonstrate pouring a drink with the right hand with attention to right side and not knocking the cup over and spilling.      Baseline unable at eval, 10th visit starting to  use right hand more and attempting to pour with right.  20th visit still has difficulty at times.  12/6: frequency is less for this but has occasional accidents with spilling if not focused on task.    Time 12    Period Weeks  Status On-going    Target Date 09/21/20                 Plan - 09/08/20 1504    Clinical Impression Statement Patient continues to demonstrate difficulty with in hand manipulation skills with right hand during picking up and moving items.  Patient able to transfer item to board if therapist hands him the item, but has more difficulty if picking up from the table or container.  Continue to work towards manipulation skills for greater coordination with items at home.  Continue OT to maximize safety and independence with necessary daily tasks.    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    Occupational performance deficits (Please refer to evaluation for details): ADL's;IADL's;Leisure    Body Structure / Function / Physical Skills ADL;Dexterity;Flexibility;ROM;Strength;Vision;Balance;Coordination;FMC;IADL;Tone;Endurance;UE functional use;Mobility    Cognitive Skills Attention;Safety Awareness;Problem Solve    Psychosocial Skills Environmental  Adaptations;Routines and Behaviors;Habits;Interpersonal Interaction    Rehab Potential Good    Clinical Decision Making Several treatment options, min-mod task modification necessary    Comorbidities Affecting Occupational Performance: May have comorbidities impacting occupational performance    Modification or Assistance to Complete Evaluation  Min-Moderate modification of tasks or assist with  assess necessary to complete eval    OT Frequency 1x / week    OT Duration 12 weeks    OT Treatment/Interventions Self-care/ADL training;Visual/perceptual remediation/compensation;DME and/or AE instruction;Patient/family education;Moist Heat;Therapeutic exercise;Manual Therapy;Therapeutic activities;Neuromuscular education    Consulted and Agree with Plan of Care Patient    Family Member Consulted wife, [name]           Patient will benefit from skilled therapeutic intervention in order to improve the following deficits and impairments:   Body Structure / Function / Physical Skills: ADL,Dexterity,Flexibility,ROM,Strength,Vision,Balance,Coordination,FMC,IADL,Tone,Endurance,UE functional use,Mobility Cognitive Skills: Attention,Safety Awareness,Problem Solve Psychosocial Skills: Environmental  Technical sales engineer   Visit Diagnosis: Muscle weakness (generalized)  Other lack of coordination  Neurologic neglect syndrome  Unsteadiness on feet  Difficulty in walking, not elsewhere classified    Problem List Patient Active Problem List   Diagnosis Date Noted  . Sleep disturbance   . PAF (paroxysmal atrial fibrillation) ([place])   . Recurrent UTI   . Hypokalemia   . Hyponatremia   . Abnormal urine   . 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 Benign essential HTN   . Urinary retention   . History of gout   . Global aphasia   . SAH (subarachnoid hemorrhage) ([place])   . SDH (subdural hematoma) ([employer])   . Lymphocytosis   . Subdural hemorrhage following injury ([place]) 05/12/2016   Janera Peugh T Allyana Vogan, OTR/L, CLT  Shoshannah Faubert 09/08/2020, 3:21 PM  [place] MAIN [facility] SERVICES [address] [place], [place], [address] Phone: [phone]   Fax:  [phone]  Name: Elija Mccamish MRN: [id number] Date of Birth: [birth year]

## 2020-09-14 ENCOUNTER — Other Ambulatory Visit: Payer: Self-pay

## 2020-09-14 ENCOUNTER — Encounter: Payer: Self-pay | Admitting: Occupational Therapy

## 2020-09-14 ENCOUNTER — Ambulatory Visit: Payer: PPO | Admitting: Occupational Therapy

## 2020-09-14 DIAGNOSIS — M6281 Muscle weakness (generalized): Secondary | ICD-10-CM | POA: Diagnosis not present

## 2020-09-14 DIAGNOSIS — R262 Difficulty in walking, not elsewhere classified: Secondary | ICD-10-CM

## 2020-09-14 DIAGNOSIS — R278 Other lack of coordination: Secondary | ICD-10-CM

## 2020-09-14 DIAGNOSIS — R2681 Unsteadiness on feet: Secondary | ICD-10-CM

## 2020-09-14 DIAGNOSIS — R414 Neurologic neglect syndrome: Secondary | ICD-10-CM

## 2020-09-14 NOTE — Therapy (Signed)
[place] MAIN [facility] SERVICES [address] [place], [place], [address] Phone: [phone]   Fax:  [phone]  Occupational Therapy Treatment/Progress Update Reporting period from 06/29/2021 to 09/14/2020  Patient Details  Name: James [name] MRN: [id number] Date of Birth: [birth year] Referring Provider (OT): Joselyn Arrow   Encounter Date: 09/14/2020   OT End of Session - 09/18/20 1526    Visit Number 77    Number of Visits 72    Date for OT Re-Evaluation 09/21/20    OT Start Time 1100    OT Stop Time 1147    OT Time Calculation (min) 47 min    Activity Tolerance Patient tolerated treatment well    Behavior During Therapy [facility] for tasks assessed/performed           Past Medical History:  Diagnosis Date  . Alcohol abuse, in remission   . Atrial fibrillation ([place])   . Bilateral renal masses   . Closed right ankle fracture   . 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 Gait disorder   . Gout   . Hypertension   . Seizure disorder ([place])   . 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     Past Surgical History:  Procedure Laterality Date  . CRANIECTOMY FOR DEPRESSED SKULL FRACTURE  2009   with MRSA infection  . CRANIOTOMY     X 2  . HERNIA REPAIR     during childhood  . ORIF ANKLE FRACTURE Left 2012    There were no vitals filed for this visit.   Subjective Assessment - 09/18/20 1525    Subjective  Pt laughing and ready to get started.  Had a good weekend.    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  He suffered another fall in 2017.  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     Patient Stated Goals Patient indicates he wants to be stronger, use his right hand for daily tasks, be as independent as possible.    Currently in Pain?  No/denies    Multiple Pain Sites No          Therapeutic Exercise:  BUE strengthening with use of UBE for resistive reciprocal motion for 8 mins, forwards/backwards, alternating levels of resistance with therapist in constant attendance to ensure grip and adjust settings.  ADL: Manipulation of medication bottles, variety of sized bottles, different types of tops including several forms of child resistant lids.  Pt demonstrates difficulty with smaller "pills" to pick up to place into bottles.  Multiple trials completed.  Reassessment of goals, updated to reflect progress.    Response to tx:   Patient continues to work towards refining hand skills for use in functional daily tasks.  Decreased manipulation skills on the right but demonstrates improved initiation of use of right hand during activities with decreased cues.  Improved modulation of graded pressure when picking up and manipulation of objects.  Strength improved, balance remains impaired.  Min guard to min assist to reach down to pick up items from floor level.  Continue to work towards goals in plan of care to improve independence in daily tasks.                      OT Education - 09/18/20  1526    Education provided Yes    Education Details functional use of right hand, managing pill bottles    Person(s) Educated Patient;Spouse    Methods Explanation;Demonstration;Verbal cues    Comprehension Verbalized understanding;Returned demonstration;Verbal cues required               OT Long Term Goals - 09/18/20 1527      OT LONG TERM GOAL #1   Title Patient will demonstrate use of utensil in right hand for self feeding with modified independence with little to no spillage.     Baseline unable at eval to hold utensil, 10th visit holding utensil, some feeding. 20th visit, feeding self consistently but still has some moderate spillage    Time 12    Period Weeks    Status Achieved      OT LONG TERM GOAL #2    Title Pt will increase hand strength in right by 5# to be able to cut food with modified independence.    Baseline unable to cut meat at evaluation, 10th visit starting to work on cutting meat, patient reports difficulty with cutting meat. 12/6 still has difficulty with cutting meat unless it does not require a knife.  2/21: pt able to complete with soft meats, difficulty with steak    Time 12    Period Weeks    Status On-going    Target Date 09/21/20      OT LONG TERM GOAL #3   Title Patient will demonstrate increased awareness and strategies to attend to right side to hold items without dropping items.    Baseline difficulty with detecting items in right hand at eval, forgets items are in hand, 10th visit improved but still working on 12-30-2019: minimal dropping of items this date.  20th visit continued dropping of items occasionally but with increased awareness.  Update:  remains inconsistent but does well with repeated cues    Time 12    Period Weeks    Status On-going    Target Date 09/21/20      OT LONG TERM GOAL #4   Title Patient will demonstrate independence in home exercise program for ROM, strength and coordination of right hand.    Baseline eval-patient has not been performing exercises from last episode of care and demonstrates decline, 10th visit doing exercises at home and continue to modify program. 20th visit pt continues to engage in HEP with upgrades frequently    Time 12    Period Weeks    Status Partially Met    Target Date 09/21/20      OT LONG TERM GOAL #5   Title Pt to demonstrate improved balance with transitional movements with sit to stand from all surfaces with no loss of balance to reduce fall risk. 12/6: Pt impulsive with transitional movements and remains at risk for falls    Time 12    Period Weeks    Status On-going    Target Date 09/21/20      OT LONG TERM GOAL #6   Title Patient will demonstrate pouring a drink with the right hand with attention to  right side and not knocking the cup over and spilling.      Baseline unable at eval, 10th visit starting to use right hand more and attempting to pour with right.  20th visit still has difficulty at times.  12/6: frequency is less for this but has occasional accidents with spilling if not focused on task.    Time 12  Period Weeks    Status On-going    Target Date 09/21/20                 Plan - 09/18/20 1527    Clinical Impression Statement Patient continues to work towards refining hand skills for use in functional daily tasks.  Decreased manipulation skills on the right but demonstrates improved initiation of use of right hand during activities with decreased cues.  Improved modulation of graded pressure when picking up and manipulation of objects.  Strength improved, balance remains impaired.  Min guard to min assist to reach down to pick up items from floor level.  Continue to work towards goals in plan of care to improve independence in daily tasks.    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    Occupational performance deficits (Please refer to evaluation for details): ADL's;IADL's;Leisure    Body Structure / Function / Physical Skills ADL;Dexterity;Flexibility;ROM;Strength;Vision;Balance;Coordination;FMC;IADL;Tone;Endurance;UE functional use;Mobility    Cognitive Skills Attention;Safety Awareness;Problem Solve    Psychosocial Skills Environmental  Adaptations;Routines and Behaviors;Habits;Interpersonal Interaction    Rehab Potential Good    Clinical Decision Making Several treatment options, min-mod task modification necessary    Comorbidities  Affecting Occupational Performance: May have comorbidities impacting occupational performance    Modification or Assistance to Complete Evaluation  Min-Moderate modification of tasks or assist with assess necessary to complete eval    OT Frequency 1x / week    OT Duration 12 weeks    OT Treatment/Interventions Self-care/ADL training;Visual/perceptual remediation/compensation;DME and/or AE instruction;Patient/family education;Moist Heat;Therapeutic exercise;Manual Therapy;Therapeutic activities;Neuromuscular education    Consulted and Agree with Plan of Care Patient    Family Member Consulted wife, [name]           Patient will benefit from skilled therapeutic intervention in order to improve the following deficits and impairments:   Body Structure / Function / Physical Skills: ADL,Dexterity,Flexibility,ROM,Strength,Vision,Balance,Coordination,FMC,IADL,Tone,Endurance,UE functional use,Mobility Cognitive Skills: Attention,Safety Awareness,Problem Solve Psychosocial Skills: Environmental  Technical sales engineer   Visit Diagnosis: Muscle weakness (generalized)  Other lack of coordination  Neurologic neglect syndrome  Unsteadiness on feet  Difficulty in walking, not elsewhere classified    Problem List Patient Active Problem List   Diagnosis Date Noted  . Sleep disturbance   . PAF (paroxysmal atrial fibrillation) ([place])   . Recurrent UTI   . Hypokalemia   . Hyponatremia   . Abnormal urine   . 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 Benign essential HTN   . Urinary retention   . History of gout   . Global aphasia   . SAH (subarachnoid hemorrhage) ([place])   . SDH (subdural hematoma) ([place])   . Lymphocytosis   . Subdural hemorrhage following injury [facility]) 05/12/2016    T , OTR/L, CLT  , 09/18/2020, 3:39 PM  [place] MAIN [facility] SERVICES [address]  [place], [place], [address] Phone: [phone]   Fax:  [phone]  Name: Aiman Noe MRN: [id number] Date of Birth: March 26, 1941

## 2020-09-21 ENCOUNTER — Encounter: Payer: Self-pay | Admitting: Occupational Therapy

## 2020-09-21 ENCOUNTER — Other Ambulatory Visit: Payer: Self-pay

## 2020-09-21 ENCOUNTER — Ambulatory Visit: Payer: PPO | Admitting: Occupational Therapy

## 2020-09-21 DIAGNOSIS — R2681 Unsteadiness on feet: Secondary | ICD-10-CM

## 2020-09-21 DIAGNOSIS — M6281 Muscle weakness (generalized): Secondary | ICD-10-CM

## 2020-09-21 DIAGNOSIS — R278 Other lack of coordination: Secondary | ICD-10-CM

## 2020-09-21 DIAGNOSIS — R262 Difficulty in walking, not elsewhere classified: Secondary | ICD-10-CM

## 2020-09-21 DIAGNOSIS — R414 Neurologic neglect syndrome: Secondary | ICD-10-CM

## 2020-09-21 NOTE — Therapy (Signed)
[place] MAIN [facility] SERVICES [address] [place], [place], [address] Phone: [phone]   Fax:  [phone]  Occupational Therapy Treatment/Recertification  Patient Details  Name: James [name] MRN: [id number] Date of Birth: 1940/10/26 Referring Provider (OT): Joselyn Arrow   Encounter Date: 09/21/2020   OT End of Session - 09/21/20 2150    Visit Number [id number]    Number of Visits 56    Date for OT Re-Evaluation 12/14/20    OT Start Time 1101    OT Stop Time 1150    OT Time Calculation (min) 49 min    Activity Tolerance Patient tolerated treatment well    Behavior During Therapy [facility] for tasks assessed/performed           Past Medical History:  Diagnosis Date  . Alcohol abuse, in remission   . Atrial fibrillation ([place])   . Bilateral renal masses   . Closed right ankle fracture   . 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 Gait disorder   . Gout   . Hypertension   . Seizure disorder ([place])   . 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     Past Surgical History:  Procedure Laterality Date  . CRANIECTOMY FOR DEPRESSED SKULL FRACTURE  2009   with MRSA infection  . CRANIOTOMY     X 2  . HERNIA REPAIR     during childhood  . ORIF ANKLE FRACTURE Left 2012    There were no vitals filed for this visit.   Subjective Assessment - 09/21/20 2147    Subjective  Pt's wife reports patient has been working on handwriting quite a bit at home, doing better with utensil use and feeding self.    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  He suffered another fall in 2017.  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     Patient Stated Goals Patient indicates he wants to be stronger, use his right hand for daily tasks, be as independent as possible.     Currently in Pain? No/denies    Multiple Pain Sites No          Therapeutic Exercise: Reassessment of hand skills as noted in flowsheet  Neuromuscular Reeducation/Fine motor Coordination skills: Manipulation of a variety of sized shapes and pieces for formulating a tetris style puzzle. Required occasional cues for following pattern design, tends to have difficulty with zig zag pieces and perceptually when the piece required flipping for mirrored image. Manipulation of small dowel stick pegs to place into board placed onto flat surface and then performed with board held in the left hand, placing pieces with right  Handwriting skills this date for writing name on small notepad with small lines, continued difficulty with legibility and sizing letters based on line size but improving    [facility] OT Assessment - 09/21/20 2202      Assessment   Medical Diagnosis CVA/right UE weakness    Referring Provider (OT) Manuella Ghazi, H    Onset Date/Surgical Date 02/21/18    Hand Dominance Right      Coordination   Right 9 Hole Peg Test difficulty this date and unable to complete    Left 9 Hole Peg Test 30      Hand Function   Right Hand Grip (  lbs) 60    Right Hand Lateral Pinch 16 lbs    Right Hand 3 Point Pinch 15 lbs    Left Hand Grip (lbs) 60    Left Hand Lateral Pinch 19 lbs    Left 3 point pinch 17 lbs          Response to tx: Patient continues to work towards refining hand skills for use in functional daily tasks.  Decreased manipulation skills on the right but demonstrates improved initiation of use of right hand during activities with decreased cues.  Improved modulation of graded pressure when picking up and manipulation of objects.  Strength improved, balance remains impaired.  Min guard to min assist to reach down to pick up items from floor level.  Pt remains impulsive at times with movements during the day, wife reports he does pause more when he gets up at night.  He has improved with self  feeding, utensil use and decreased spillage. Pt continues to benefit from skilled OT services to address limitations.  Continue to work towards goals in plan of care to improve independence in daily tasks.                    OT Education - 09/21/20 2149    Education provided Yes    Education Details goals, plan of care, progress    Person(s) Educated Patient;Spouse    Methods Explanation;Demonstration;Verbal cues    Comprehension Verbalized understanding;Returned demonstration;Verbal cues required               OT Long Term Goals - 09/21/20 2152      OT LONG TERM GOAL #1   Title Patient will demonstrate use of utensil in right hand for self feeding with modified independence with little to no spillage.     Baseline unable at eval to hold utensil, 10th visit holding utensil, some feeding. 20th visit, feeding self consistently but still has some moderate spillage    Time 12    Period Weeks    Status Achieved      OT LONG TERM GOAL #2   Title Pt will increase hand strength in right by 5# to be able to cut food with modified independence.    Baseline unable to cut meat at evaluation, 10th visit starting to work on cutting meat, patient reports difficulty with cutting meat. 12/6 still has difficulty with cutting meat unless it does not require a knife.  2/21: pt able to complete with soft meats, difficulty with steak    Time 12    Period Weeks    Status On-going    Target Date 12/14/20      OT LONG TERM GOAL #3   Title Patient will demonstrate increased awareness and strategies to attend to right side to hold items without dropping items.    Baseline difficulty with detecting items in right hand at eval, forgets items are in hand, 10th visit improved but still working on 12-30-2019: minimal dropping of items this date.  20th visit continued dropping of items occasionally but with increased awareness.  Update:  remains inconsistent but does well with repeated cues    Time  12    Period Weeks    Status On-going    Target Date 12/14/20      OT LONG TERM GOAL #4   Title Patient will demonstrate independence in home exercise program for ROM, strength and coordination of right hand.    Baseline eval-patient has not been performing exercises from   last episode of care and demonstrates decline, 10th visit doing exercises at home and continue to modify program. 20th visit pt continues to engage in HEP with upgrades frequently    Time 12    Period Weeks    Status Partially Met    Target Date 12/14/20      OT LONG TERM GOAL #5   Title Pt to demonstrate improved balance with transitional movements with sit to stand from all surfaces with no loss of balance to reduce fall risk. 12/6: Pt impulsive with transitional movements and remains at risk for falls    Time 12    Period Weeks    Status On-going    Target Date 12/14/20      OT LONG TERM GOAL #6   Title Patient will demonstrate pouring a drink with the right hand with attention to right side and not knocking the cup over and spilling.      Baseline unable at eval, 10th visit starting to use right hand more and attempting to pour with right.  20th visit still has difficulty at times.  12/6: frequency is less for this but has occasional accidents with spilling if not focused on task.    Time 12    Period Weeks    Status On-going    Target Date 12/14/20                 Plan - 09/21/20 2150    Clinical Impression Statement Patient continues to work towards refining hand skills for use in functional daily tasks.  Decreased manipulation skills on the right but demonstrates improved initiation of use of right hand during activities with decreased cues.  Improved modulation of graded pressure when picking up and manipulation of objects.  Strength improved, balance remains impaired.  Min guard to min assist to reach down to pick up items from floor level.  Pt remains impulsive at times with movements during the day,  wife reports he does pause more when he gets up at night.  He has improved with self feeding, utensil use and decreased spillage. Pt continues to benefit from skilled OT services to address limitations.  Continue to work towards goals in plan of care to improve independence in daily tasks.    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    Occupational performance deficits (Please refer to evaluation for details): ADL's;IADL's;Leisure    Body Structure / Function / Physical Skills ADL;Dexterity;Flexibility;ROM;Strength;Vision;Balance;Coordination;FMC;IADL;Tone;Endurance;UE functional use;Mobility    Cognitive Skills Attention;Safety Awareness;Problem Solve    Psychosocial Skills Environmental  Adaptations;Routines and Behaviors;Habits;Interpersonal Interaction    Rehab Potential Good    Clinical Decision Making Several treatment options, min-mod task modification necessary    Comorbidities Affecting Occupational Performance: May have comorbidities impacting occupational performance    Modification or Assistance to Complete Evaluation  Min-Moderate modification of tasks or assist with assess necessary to complete eval    OT Frequency 1x / week    OT Duration 12 weeks    OT Treatment/Interventions Self-care/ADL training;Visual/perceptual remediation/compensation;DME and/or AE instruction;Patient/family education;Moist Heat;Therapeutic exercise;Manual Therapy;Therapeutic activities;Neuromuscular education    Consulted and Agree with Plan of Care Patient    Family Member Consulted wife, [name]  Patient will benefit from skilled therapeutic intervention in order  to improve the following deficits and impairments:   Body Structure / Function / Physical Skills: ADL,Dexterity,Flexibility,ROM,Strength,Vision,Balance,Coordination,FMC,IADL,Tone,Endurance,UE functional use,Mobility Cognitive Skills: Attention,Safety Awareness,Problem Solve Psychosocial Skills: Environmental  Adaptations,Routines and Musician   Visit Diagnosis: Muscle weakness (generalized)  Other lack of coordination  Neurologic neglect syndrome  Unsteadiness on feet  Difficulty in walking, not elsewhere classified    Problem List Patient Active Problem List   Diagnosis Date Noted  . Sleep disturbance   . PAF (paroxysmal atrial fibrillation) ([place])   . Recurrent UTI   . Hypokalemia   . Hyponatremia   . Abnormal urine   . 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 Benign essential HTN   . Urinary retention   . History of gout   . Global aphasia   . SAH (subarachnoid hemorrhage) ([place])   . SDH (subdural hematoma) ([place])   . Lymphocytosis   . Subdural hemorrhage following injury [facility]) 05/12/2016   Lavi Sheehan T Javayah Magaw, OTR/L, CLT  Zakariye Nee 09/21/2020, 10:03 PM  [place] MAIN [facility] SERVICES [address] [place], [place], [address] Phone: [phone]   Fax:  [phone]  Name: James [name] MRN: [id number] Date of Birth: March 13, 1941

## 2020-09-28 ENCOUNTER — Encounter: Payer: Self-pay | Admitting: Occupational Therapy

## 2020-09-28 ENCOUNTER — Other Ambulatory Visit: Payer: Self-pay

## 2020-09-28 ENCOUNTER — Ambulatory Visit: Payer: PPO | Attending: Neurology | Admitting: Occupational Therapy

## 2020-09-28 DIAGNOSIS — R278 Other lack of coordination: Secondary | ICD-10-CM | POA: Insufficient documentation

## 2020-09-28 DIAGNOSIS — M6281 Muscle weakness (generalized): Secondary | ICD-10-CM | POA: Insufficient documentation

## 2020-09-28 DIAGNOSIS — R414 Neurologic neglect syndrome: Secondary | ICD-10-CM | POA: Insufficient documentation

## 2020-09-28 DIAGNOSIS — R262 Difficulty in walking, not elsewhere classified: Secondary | ICD-10-CM | POA: Insufficient documentation

## 2020-09-28 DIAGNOSIS — R2681 Unsteadiness on feet: Secondary | ICD-10-CM | POA: Insufficient documentation

## 2020-09-28 NOTE — Therapy (Signed)
[place] MAIN [facility] SERVICES [address] [place], [place], [address] Phone: [phone]   Fax:  [phone]  Occupational Therapy Treatment  Patient Details  Name: James [name] MRN: [id number] Date of Birth: 29-Mar-1941 Referring Provider (OT): Joselyn Arrow   Encounter Date: 09/28/2020   OT End of Session - 09/28/20 1031    Visit Number 72    Number of Visits 84    Date for OT Re-Evaluation 12/14/20    OT Start Time 1016    OT Stop Time 1100    OT Time Calculation (min) 44 min    Activity Tolerance Patient tolerated treatment well    Behavior During Therapy [facility] for tasks assessed/performed           Past Medical History:  Diagnosis Date  . Alcohol abuse, in remission   . Atrial fibrillation ([place])   . Bilateral renal masses   . Closed right ankle fracture   . 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 Gait disorder   . Gout   . Hypertension   . Seizure disorder ([place])   . 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     Past Surgical History:  Procedure Laterality Date  . CRANIECTOMY FOR DEPRESSED SKULL FRACTURE  2009   with MRSA infection  . CRANIOTOMY     X 2  . HERNIA REPAIR     during childhood  . ORIF ANKLE FRACTURE Left 2012    There were no vitals filed for this visit.   Subjective Assessment - 09/28/20 1030    Subjective  Pt indicating he sat outside some over the weekend since the weather was nice.    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  He suffered another fall in 2017.  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     Patient Stated Goals Patient indicates he wants to be stronger, use his right hand for daily tasks, be as independent as possible.    Currently in Pain? No/denies    Multiple Pain Sites No             Therapeutic Exercise: Pt performing UBE in standing, forwards/backwards, alternating levels of resistance from 3.3 to 4.0 for 8 mins with therapist in constant attendance to provide min guard, adjust settings and ensure grip.     Neuromuscular Reeducation: Pt engaging in building designs with use of PVC with picture designs to copy.  Cues to use right hand to assist with task, many require 2 hand use.  Multiple designs completed, moderate cues for right hand use into task.   Handwriting: Pt focusing on writing his first and last name on large lined paper, variety of pens used.  Pt performs best with gel pen with improved ink flow.  Pt had difficulty with secure grasping patterns today on pen.  Response to tx: Pt demonstrating difficulty with grip on pen today, constantly losing grasp and having to constantly make adjustments.  Increased cues this date to use right hand into bilateral task of PVC pipe building, tends to get focused on the design and initiates left hand instead.  Recommend patient bring in pen he likes to use at home next time.  Continue towards goals in plan of care to increase right hand use for functional daily  tasks.                      OT Education - 09/28/20 1031    Education provided Yes    Education Details use of right hand, functional task.    Person(s) Educated Patient;Spouse    Methods Explanation;Demonstration;Verbal cues    Comprehension Verbalized understanding;Returned demonstration;Verbal cues required               OT Long Term Goals - 09/21/20 2152      OT LONG TERM GOAL #1   Title Patient will demonstrate use of utensil in right hand for self feeding with modified independence with little to no spillage.     Baseline unable at eval to hold utensil, 10th visit holding utensil, some feeding. 20th visit, feeding self consistently but still has some moderate spillage    Time 12    Period Weeks    Status Achieved      OT LONG  TERM GOAL #2   Title Pt will increase hand strength in right by 5# to be able to cut food with modified independence.    Baseline unable to cut meat at evaluation, 10th visit starting to work on cutting meat, patient reports difficulty with cutting meat. 12/6 still has difficulty with cutting meat unless it does not require a knife.  2/21: pt able to complete with soft meats, difficulty with steak    Time 12    Period Weeks    Status On-going    Target Date 12/14/20      OT LONG TERM GOAL #3   Title Patient will demonstrate increased awareness and strategies to attend to right side to hold items without dropping items.    Baseline difficulty with detecting items in right hand at eval, forgets items are in hand, 10th visit improved but still working on 12-30-2019: minimal dropping of items this date.  20th visit continued dropping of items occasionally but with increased awareness.  Update:  remains inconsistent but does well with repeated cues    Time 12    Period Weeks    Status On-going    Target Date 12/14/20      OT LONG TERM GOAL #4   Title Patient will demonstrate independence in home exercise program for ROM, strength and coordination of right hand.    Baseline eval-patient has not been performing exercises from last episode of care and demonstrates decline, 10th visit doing exercises at home and continue to modify program. 20th visit pt continues to engage in HEP with upgrades frequently    Time 12    Period Weeks    Status Partially Met    Target Date 12/14/20      OT LONG TERM GOAL #5   Title Pt to demonstrate improved balance with transitional movements with sit to stand from all surfaces with no loss of balance to reduce fall risk. 12/6: Pt impulsive with transitional movements and remains at risk for falls    Time 12    Period Weeks    Status On-going    Target Date 12/14/20      OT LONG TERM GOAL #6   Title Patient will demonstrate pouring a drink with the right hand with  attention to right side and not knocking the cup over and spilling.      Baseline unable at eval, 10th visit starting to use right hand more and attempting to pour with right.  20th visit still has difficulty at  times.  12/6: frequency is less for this but has occasional accidents with spilling if not focused on task.    Time 12    Period Weeks    Status On-going    Target Date 12/14/20                 Plan - 09/28/20 1032    Clinical Impression Statement Pt demonstrating difficulty with grip on pen today, constantly losing grasp and having to constantly make adjustments.  Increased cues this date to use right hand into bilateral task of PVC pipe building, tends to get focused on the design and initiates left hand instead.  Recommend patient bring in pen he likes to use at home next time.  Continue towards goals in plan of care to increase right hand use for functional daily tasks.    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    Occupational performance deficits (Please refer to evaluation for details): ADL's;IADL's;Leisure    Body Structure / Function / Physical Skills ADL;Dexterity;Flexibility;ROM;Strength;Vision;Balance;Coordination;FMC;IADL;Tone;Endurance;UE functional use;Mobility    Cognitive Skills Attention;Safety Awareness;Problem Solve    Psychosocial Skills Environmental  Adaptations;Routines and Behaviors;Habits;Interpersonal Interaction    Rehab Potential Good    Clinical Decision Making Several treatment options, min-mod task modification necessary    Comorbidities Affecting Occupational Performance: May have comorbidities impacting occupational  performance    Modification or Assistance to Complete Evaluation  Min-Moderate modification of tasks or assist with assess necessary to complete eval    OT Frequency 1x / week    OT Duration 12 weeks    OT Treatment/Interventions Self-care/ADL training;Visual/perceptual remediation/compensation;DME and/or AE instruction;Patient/family education;Moist Heat;Therapeutic exercise;Manual Therapy;Therapeutic activities;Neuromuscular education    Consulted and Agree with Plan of Care Patient    Family Member Consulted wife, [name]           Patient will benefit from skilled therapeutic intervention in order to improve the following deficits and impairments:   Body Structure / Function / Physical Skills: ADL,Dexterity,Flexibility,ROM,Strength,Vision,Balance,Coordination,FMC,IADL,Tone,Endurance,UE functional use,Mobility Cognitive Skills: Attention,Safety Awareness,Problem Solve Psychosocial Skills: Environmental  Technical sales engineer   Visit Diagnosis: Muscle weakness (generalized)  Other lack of coordination  Neurologic neglect syndrome  Unsteadiness on feet  Difficulty in walking, not elsewhere classified    Problem List Patient Active Problem List   Diagnosis Date Noted  . Sleep disturbance   . PAF (paroxysmal atrial fibrillation) ([place])   . Recurrent UTI   . Hypokalemia   . Hyponatremia   . Abnormal urine   . 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 Benign essential HTN   . Urinary retention   . History of gout   . Global aphasia   . SAH (subarachnoid hemorrhage) ([place])   . SDH (subdural hematoma) ([place])   . Lymphocytosis   . Subdural hemorrhage following injury ([place]) 05/12/2016   Dashanique Brownstein T Murl Zogg, OTR/L, CLT  Maeryn Mcgath 09/28/2020, 11:00 AM  [place] MAIN [facility] SERVICES [address] [place], [place], [address] Phone: [phone]   Fax:  [phone]  Name: James [name] MRN: [id number] Date of Birth: [birth year]

## 2020-10-05 ENCOUNTER — Other Ambulatory Visit: Payer: Self-pay

## 2020-10-05 ENCOUNTER — Ambulatory Visit: Payer: PPO | Admitting: Occupational Therapy

## 2020-10-05 DIAGNOSIS — M6281 Muscle weakness (generalized): Secondary | ICD-10-CM | POA: Diagnosis not present

## 2020-10-05 DIAGNOSIS — R2681 Unsteadiness on feet: Secondary | ICD-10-CM

## 2020-10-05 DIAGNOSIS — R278 Other lack of coordination: Secondary | ICD-10-CM

## 2020-10-05 DIAGNOSIS — R414 Neurologic neglect syndrome: Secondary | ICD-10-CM

## 2020-10-11 ENCOUNTER — Encounter: Payer: Self-pay | Admitting: Occupational Therapy

## 2020-10-11 NOTE — Therapy (Signed)
[place] MAIN [facility] SERVICES [address] [place], [place], [address] Phone: [phone]   Fax:  [phone]  Occupational Therapy Treatment  Patient Details  Name: James [name] MRN: [id number] Date of Birth: 1941-01-01 Referring Provider (OT): Joselyn Arrow   Encounter Date: 10/05/2020   OT End of Session - 10/11/20 1402    Visit Number 16    Number of Visits 51    Date for OT Re-Evaluation 12/14/20    OT Start Time 1015    OT Stop Time 1100    OT Time Calculation (min) 45 min    Activity Tolerance Patient tolerated treatment well    Behavior During Therapy [facility] for tasks assessed/performed           Past Medical History:  Diagnosis Date  . Alcohol abuse, in remission   . Atrial fibrillation ([place])   . Bilateral renal masses   . Closed right ankle fracture   . 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 Gait disorder   . Gout   . Hypertension   . Seizure disorder ([place])   . 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     Past Surgical History:  Procedure Laterality Date  . CRANIECTOMY FOR DEPRESSED SKULL FRACTURE  2009   with MRSA infection  . CRANIOTOMY     X 2  . HERNIA REPAIR     during childhood  . ORIF ANKLE FRACTURE Left 2012    There were no vitals filed for this visit.   Subjective Assessment - 10/11/20 1400    Subjective  Wife reports residents were able to come by and sign their name on a memorial piece for James [name].  Pt signed his name and did a great job!  Wife was impressed by his signature.    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  He suffered another fall in 2017.  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     Patient Stated Goals Patient indicates he wants to be stronger, use his right hand for daily tasks, be as  independent as possible.    Currently in Pain? No/denies    Multiple Pain Sites No            Therapeutic Exercise: Patient seen for UB strengthening with use of UBE performed in standing this date, forwards/backwards,alternating levels of resistance from 3.2 to 3.5 for 8 mins.  Therapist in constant attendance to ensure grip and to adjust settings.  Cues for proper gripping on right side.  Use of 2# wrist weight for shape tower, reaching all 4 levels to move stack of items from one level to the next to encourage ROM and strength of UE. Cues for lateral pinch to pick up items.    Response to tx: Patient continues to progress, tasks performed in standing this date to work on balance, ROM and strengthening.  Patient requires cues at times for type of grasp on items.  Able to tolerate 2# wrist weight for task today.  Rest breaks as needed.  Continue OT to increase independence in functional daily tasks.                       OT Education - 10/11/20 1402    Education provided Yes  Education Details use of right hand, functional task.    Person(s) Educated Patient;Spouse    Methods Explanation;Demonstration;Verbal cues    Comprehension Verbalized understanding;Returned demonstration;Verbal cues required               OT Long Term Goals - 09/21/20 2152      OT LONG TERM GOAL #1   Title Patient will demonstrate use of utensil in right hand for self feeding with modified independence with little to no spillage.     Baseline unable at eval to hold utensil, 10th visit holding utensil, some feeding. 20th visit, feeding self consistently but still has some moderate spillage    Time 12    Period Weeks    Status Achieved      OT LONG TERM GOAL #2   Title Pt will increase hand strength in right by 5# to be able to cut food with modified independence.    Baseline unable to cut meat at evaluation, 10th visit starting to work on cutting meat, patient reports difficulty with  cutting meat. 12/6 still has difficulty with cutting meat unless it does not require a knife.  2/21: pt able to complete with soft meats, difficulty with steak    Time 12    Period Weeks    Status On-going    Target Date 12/14/20      OT LONG TERM GOAL #3   Title Patient will demonstrate increased awareness and strategies to attend to right side to hold items without dropping items.    Baseline difficulty with detecting items in right hand at eval, forgets items are in hand, 10th visit improved but still working on 12-30-2019: minimal dropping of items this date.  20th visit continued dropping of items occasionally but with increased awareness.  Update:  remains inconsistent but does well with repeated cues    Time 12    Period Weeks    Status On-going    Target Date 12/14/20      OT LONG TERM GOAL #4   Title Patient will demonstrate independence in home exercise program for ROM, strength and coordination of right hand.    Baseline eval-patient has not been performing exercises from last episode of care and demonstrates decline, 10th visit doing exercises at home and continue to modify program. 20th visit pt continues to engage in HEP with upgrades frequently    Time 12    Period Weeks    Status Partially Met    Target Date 12/14/20      OT LONG TERM GOAL #5   Title Pt to demonstrate improved balance with transitional movements with sit to stand from all surfaces with no loss of balance to reduce fall risk. 12/6: Pt impulsive with transitional movements and remains at risk for falls    Time 12    Period Weeks    Status On-going    Target Date 12/14/20      OT LONG TERM GOAL #6   Title Patient will demonstrate pouring a drink with the right hand with attention to right side and not knocking the cup over and spilling.      Baseline unable at eval, 10th visit starting to use right hand more and attempting to pour with right.  20th visit still has difficulty at times.  12/6: frequency is  less for this but has occasional accidents with spilling if not focused on task.    Time 12    Period Weeks    Status On-going  Target Date 12/14/20                 Plan - 10/11/20 1402    Clinical Impression Statement Patient continues to progress, tasks performed in standing this date to work on balance, ROM and strengthening.  Patient requires cues at times for type of grasp on items.  Able to tolerate 2# wrist weight for task today.  Rest breaks as needed.  Continue OT to increase independence in functional daily tasks.    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    Occupational performance deficits (Please refer to evaluation for details): ADL's;IADL's;Leisure    Body Structure / Function / Physical Skills ADL;Dexterity;Flexibility;ROM;Strength;Vision;Balance;Coordination;FMC;IADL;Tone;Endurance;UE functional use;Mobility    Cognitive Skills Attention;Safety Awareness;Problem Solve    Psychosocial Skills Environmental  Adaptations;Routines and Behaviors;Habits;Interpersonal Interaction    Rehab Potential Good    Clinical Decision Making Several treatment options, min-mod task modification necessary    Comorbidities Affecting Occupational Performance: May have comorbidities impacting occupational performance    Modification or Assistance to Complete Evaluation  Min-Moderate modification of tasks or assist with assess necessary to complete eval    OT Frequency 1x / week    OT Duration 12 weeks    OT Treatment/Interventions Self-care/ADL training;Visual/perceptual remediation/compensation;DME and/or AE instruction;Patient/family education;Moist  Heat;Therapeutic exercise;Manual Therapy;Therapeutic activities;Neuromuscular education    Consulted and Agree with Plan of Care Patient    Family Member Consulted wife, [name]           Patient will benefit from skilled therapeutic intervention in order to improve the following deficits and impairments:   Body Structure / Function / Physical Skills: ADL,Dexterity,Flexibility,ROM,Strength,Vision,Balance,Coordination,FMC,IADL,Tone,Endurance,UE functional use,Mobility Cognitive Skills: Attention,Safety Awareness,Problem Solve Psychosocial Skills: Environmental  Technical sales engineer   Visit Diagnosis: Muscle weakness (generalized)  Other lack of coordination  Neurologic neglect syndrome  Unsteadiness on feet    Problem List Patient Active Problem List   Diagnosis Date Noted  . Sleep disturbance   . PAF (paroxysmal atrial fibrillation) ([place])   . Recurrent UTI   . Hypokalemia   . Hyponatremia   . Abnormal urine   . 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 Benign essential HTN   . Urinary retention   . History of gout   . Global aphasia   . SAH (subarachnoid hemorrhage) ([place])   . SDH (subdural hematoma) ([place])   . Lymphocytosis   . Subdural hemorrhage following injury ([place]) 05/12/2016   Laikyn Gewirtz T Daiwik [place], OTR/L, CLT  Tanda Morrissey 10/11/2020, 2:09 PM  [place] MAIN [facility] SERVICES [address] [place], [place], [address] Phone: [phone]   Fax:  [phone]  Name: James [name] MRN: [id number] Date of Birth: Feb 06, 1941

## 2020-10-12 ENCOUNTER — Ambulatory Visit: Payer: PPO | Admitting: Occupational Therapy

## 2020-10-12 ENCOUNTER — Other Ambulatory Visit: Payer: Self-pay

## 2020-10-12 DIAGNOSIS — M6281 Muscle weakness (generalized): Secondary | ICD-10-CM

## 2020-10-12 DIAGNOSIS — R2681 Unsteadiness on feet: Secondary | ICD-10-CM

## 2020-10-12 DIAGNOSIS — R278 Other lack of coordination: Secondary | ICD-10-CM

## 2020-10-12 DIAGNOSIS — R414 Neurologic neglect syndrome: Secondary | ICD-10-CM

## 2020-10-15 ENCOUNTER — Encounter: Payer: Self-pay | Admitting: Occupational Therapy

## 2020-10-15 NOTE — Therapy (Signed)
[place] MAIN [facility] SERVICES [address] [place], [place], [address] Phone: [phone]   Fax:  [phone]  Occupational Therapy Treatment  Patient Details  Name: James [name] MRN: [id number] Date of Birth: 10/29/1940 Referring Provider (OT): Joselyn Arrow   Encounter Date: 10/12/2020   OT End of Session - 10/15/20 1133    Visit Number 53    Number of Visits 25    Date for OT Re-Evaluation 12/14/20    OT Start Time 1015    OT Stop Time 1100    OT Time Calculation (min) 45 min    Activity Tolerance Patient tolerated treatment well    Behavior During Therapy [facility] for tasks assessed/performed           Past Medical History:  Diagnosis Date  . Alcohol abuse, in remission   . Atrial fibrillation ([place])   . Bilateral renal masses   . Closed right ankle fracture   . 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 Gait disorder   . Gout   . Hypertension   . Seizure disorder ([place])   . 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     Past Surgical History:  Procedure Laterality Date  . CRANIECTOMY FOR DEPRESSED SKULL FRACTURE  2009   with MRSA infection  . CRANIOTOMY     X 2  . HERNIA REPAIR     during childhood  . ORIF ANKLE FRACTURE Left 2012    There were no vitals filed for this visit.   Subjective Assessment - 10/15/20 1132    Subjective  Patient with no complaints this date, no pain.    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  He suffered another fall in 2017.  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     Patient Stated Goals Patient indicates he wants to be stronger, use his right hand for daily tasks, be as independent as possible.    Currently in Pain? No/denies    Multiple Pain Sites No          Therapeutic exercise: Patient  seen this date for upper body strengthening with use of UBE performed in standing, resistance of 3.0-3.5, forwards and backwards directions with therapist in constant attendance to ensure grip on the right side and to adjust settings.  Therapist also providing min guard to supervision during task.  Neuromuscular reeducation: Patient seen for manipulation of small shape pieces from perfection game which have small stems to use to pick up items.  Cues for tip to tip prehension pattern on the right for manipulating and placing into grid.  Patient requires increased time to complete task and occasional cues when he has similar shapes or items of the same shape but 2 different sizes. Manipulation of large buttons 1 inch in size or greater to pick up from tabletop in place into container.  Cues for prehension patterns to pick up from flat surface with occasional difficulty. Use of medium nylon rope to work on unknotting of rope with use of bilateral upper extremities with use of a tip to tip prehension pattern.  Patient improved with modulating graded pressure required for knots which are tighter.  Response to treatment: Patient has continued to progress towards goals especially with right hand functional use.  Patient able  to demonstrate tip to tip prehension patterns more consistently to pick up and manipulate items which are smaller than 1 inch.  Patient occasionally requires cues to initiate task with right hand and with the type of prehension skills required for the task, tip to tip or lateral pinch skills.  Continue to work towards goals and plan of care to improve right hand functional use in necessary daily tasks.                      OT Education - 10/15/20 1133    Education provided Yes    Education Details use of right hand, functional task.    Person(s) Educated Patient;Spouse    Methods Explanation;Demonstration;Verbal cues    Comprehension Verbalized understanding;Returned  demonstration;Verbal cues required               OT Long Term Goals - 09/21/20 2152      OT LONG TERM GOAL #1   Title Patient will demonstrate use of utensil in right hand for self feeding with modified independence with little to no spillage.     Baseline unable at eval to hold utensil, 10th visit holding utensil, some feeding. 20th visit, feeding self consistently but still has some moderate spillage    Time 12    Period Weeks    Status Achieved      OT LONG TERM GOAL #2   Title Pt will increase hand strength in right by 5# to be able to cut food with modified independence.    Baseline unable to cut meat at evaluation, 10th visit starting to work on cutting meat, patient reports difficulty with cutting meat. 12/6 still has difficulty with cutting meat unless it does not require a knife.  2/21: pt able to complete with soft meats, difficulty with steak    Time 12    Period Weeks    Status On-going    Target Date 12/14/20      OT LONG TERM GOAL #3   Title Patient will demonstrate increased awareness and strategies to attend to right side to hold items without dropping items.    Baseline difficulty with detecting items in right hand at eval, forgets items are in hand, 10th visit improved but still working on 12-30-2019: minimal dropping of items this date.  20th visit continued dropping of items occasionally but with increased awareness.  Update:  remains inconsistent but does well with repeated cues    Time 12    Period Weeks    Status On-going    Target Date 12/14/20      OT LONG TERM GOAL #4   Title Patient will demonstrate independence in home exercise program for ROM, strength and coordination of right hand.    Baseline eval-patient has not been performing exercises from last episode of care and demonstrates decline, 10th visit doing exercises at home and continue to modify program. 20th visit pt continues to engage in HEP with upgrades frequently    Time 12    Period Weeks     Status Partially Met    Target Date 12/14/20      OT LONG TERM GOAL #5   Title Pt to demonstrate improved balance with transitional movements with sit to stand from all surfaces with no loss of balance to reduce fall risk. 12/6: Pt impulsive with transitional movements and remains at risk for falls    Time 12    Period Weeks    Status On-going  Target Date 12/14/20      OT LONG TERM GOAL #6   Title Patient will demonstrate pouring a drink with the right hand with attention to right side and not knocking the cup over and spilling.      Baseline unable at eval, 10th visit starting to use right hand more and attempting to pour with right.  20th visit still has difficulty at times.  12/6: frequency is less for this but has occasional accidents with spilling if not focused on task.    Time 12    Period Weeks    Status On-going    Target Date 12/14/20                 Plan - 10/15/20 1134    Clinical Impression Statement Patient has continued to progress towards goals especially with right hand functional use.  Patient able to demonstrate tip to tip prehension patterns more consistently to pick up and manipulate items which are smaller than 1 inch.  Patient occasionally requires cues to initiate task with right hand and with the type of prehension skills required for the task, tip to tip or lateral pinch skills.  Continue to work towards goals and plan of care to improve right hand functional use in necessary daily tasks.    OT Occupational Profile and History Comprehensive Assessment- Review of records and extensive additional review of physical, cognitive, psychosocial history related to current functional performance    Occupational Profile and client history currently impacting functional performance repeated falls, TBI, expressive aphasia, limited motion in right hand, lack of coordination from previous CVA    Occupational performance deficits (Please refer to evaluation for  details): ADL's;IADL's;Leisure    Body Structure / Function / Physical Skills ADL;Dexterity;Flexibility;ROM;Strength;Vision;Balance;Coordination;FMC;IADL;Tone;Endurance;UE functional use;Mobility    Cognitive Skills Attention;Safety Awareness;Problem Solve    Psychosocial Skills Environmental  Adaptations;Routines and Behaviors;Habits;Interpersonal Interaction    Rehab Potential Good    Clinical Decision Making Several treatment options, min-mod task modification necessary    Comorbidities Affecting Occupational Performance: May have comorbidities impacting occupational performance    Modification or Assistance to Complete Evaluation  Min-Moderate modification of tasks or assist with assess necessary to complete eval    OT Frequency 1x / week    OT Duration 12 weeks    OT Treatment/Interventions Self-care/ADL training;Visual/perceptual remediation/compensation;DME and/or AE instruction;Patient/family education;Moist Heat;Therapeutic exercise;Manual Therapy;Therapeutic activities;Neuromuscular education    Consulted and Agree with Plan of Care Patient    Family Member Consulted wife, [name]           Patient will benefit from skilled therapeutic intervention in order to improve the following deficits and impairments:   Body Structure / Function / Physical Skills: ADL,Dexterity,Flexibility,ROM,Strength,Vision,Balance,Coordination,FMC,IADL,Tone,Endurance,UE functional use,Mobility Cognitive Skills: Attention,Safety Awareness,Problem Solve Psychosocial Skills: Environmental  Technical sales engineer   Visit Diagnosis: Muscle weakness (generalized)  Other lack of coordination  Neurologic neglect syndrome  Unsteadiness on feet    Problem List Patient Active Problem List   Diagnosis Date Noted  . Sleep disturbance   . PAF (paroxysmal atrial fibrillation) ([place])   . Recurrent UTI   . Hypokalemia   . Hyponatremia   . Abnormal urine   .  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 Benign essential HTN   . Urinary retention   . History of gout   . Global aphasia   . SAH (subarachnoid hemorrhage) ([place])   . SDH (subdural hematoma) ([place])   . Lymphocytosis   . Subdural hemorrhage following  injury [facility]) 05/12/2016   James [name] T Jhoselin Crume, OTR/L, CLT  James [name] 10/15/2020, 11:52 AM  [place] MAIN [facility] SERVICES [address] [place], [place], [address] Phone: [phone]   Fax:  [phone]  Name: James [name] MRN: [id number] Date of Birth: 1941/04/08

## 2020-10-19 ENCOUNTER — Other Ambulatory Visit: Payer: Self-pay

## 2020-10-19 ENCOUNTER — Ambulatory Visit: Payer: PPO | Admitting: Occupational Therapy

## 2020-10-19 ENCOUNTER — Encounter: Payer: Self-pay | Admitting: Occupational Therapy

## 2020-10-19 DIAGNOSIS — R414 Neurologic neglect syndrome: Secondary | ICD-10-CM

## 2020-10-19 DIAGNOSIS — R2681 Unsteadiness on feet: Secondary | ICD-10-CM

## 2020-10-19 DIAGNOSIS — M6281 Muscle weakness (generalized): Secondary | ICD-10-CM

## 2020-10-19 DIAGNOSIS — R278 Other lack of coordination: Secondary | ICD-10-CM

## 2020-10-19 NOTE — Therapy (Signed)
[place] MAIN [facility] SERVICES [address] [place], [place], [address] Phone: [phone]   Fax:  [phone]  Occupational Therapy Treatment  Patient Details  Name: James [name] MRN: [id number] Date of Birth: March 21, 1941 Referring Provider (OT): Joselyn Arrow   Encounter Date: 10/19/2020   OT End of Session - 10/21/20 1054    Visit Number [id number]    Number of Visits 81    Date for OT Re-Evaluation 12/14/20    OT Start Time 1415    OT Stop Time 1500    OT Time Calculation (min) 45 min    Activity Tolerance Patient tolerated treatment well    Behavior During Therapy [facility] for tasks assessed/performed           Past Medical History:  Diagnosis Date  . Alcohol abuse, in remission   . Atrial fibrillation ([place])   . Bilateral renal masses   . Closed right ankle fracture   . 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 Gait disorder   . Gout   . Hypertension   . Seizure disorder ([place])   . 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     Past Surgical History:  Procedure Laterality Date  . CRANIECTOMY FOR DEPRESSED SKULL FRACTURE  2009   with MRSA infection  . CRANIOTOMY     X 2  . HERNIA REPAIR     during childhood  . ORIF ANKLE FRACTURE Left 2012    There were no vitals filed for this visit.   Subjective Assessment - 10/21/20 1054    Subjective  Pt smiling and ready to work.  Pt wife asking if he can be seen [facility] in a few weeks when this therapist will be covering patients in that location.    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  He suffered another fall in 2017.  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     Patient Stated Goals Patient indicates he wants to be stronger, use his right hand for daily tasks, be as independent as  possible.    Currently in Pain? No/denies    Multiple Pain Sites No          Therapeutic Exercise:  Pt seen for UB strengthening with use of UBE in standing, supervision for balance, UBE forwards/backwards, alternating levels of resistance from 3.2 to 3.6 with therapist in constant attendance to ensure grip on right side and to adjust settings, 8 mins total. Finger strengthening with use of push pins to pick up and place into moderate resistance board, difficulty with tip to tip prehension and with manipulation to turn to place directly in the board.  Cues provided for prehension patterns.    Neuromuscular reeducation: Manipulation of small blocks, 1/4 inch in size to pick up and attempt to place into small vial container, patient demos difficulty therefore changed it to placing into small cup lid. Manipulation of small pegs unable to manipulate to place into board but able to remove if pegs are placed into the board and reinforcing tip to tip prehension pattern.   Handwriting for first and last name on large lined paper, use of gel pen.  Good progress with legibility  Response to tx: Patient demonstrates difficulty with manipulation of small objects especially when  they are 1/4 inch or smaller, able to grasp at times to pick up but has difficulty with turning and placing with small pegs.  Working towards prehension patterns with tip to tip to remove items from grid.  Handwriting has improved over time with increased legibility especially for first name, still needs large lined paper and gel pen for best results. Continue to work towards goals in plan of care to increase right hand function for daily tasks with greater independence.                      OT Education - 10/21/20 1054    Education provided Yes    Education Details use of right hand, functional task.    Person(s) Educated Patient;Spouse    Methods Explanation;Demonstration;Verbal cues    Comprehension  Verbalized understanding;Returned demonstration;Verbal cues required               OT Long Term Goals - 09/21/20 2152      OT LONG TERM GOAL #1   Title Patient will demonstrate use of utensil in right hand for self feeding with modified independence with little to no spillage.     Baseline unable at eval to hold utensil, 10th visit holding utensil, some feeding. 20th visit, feeding self consistently but still has some moderate spillage    Time 12    Period Weeks    Status Achieved      OT LONG TERM GOAL #2   Title Pt will increase hand strength in right by 5# to be able to cut food with modified independence.    Baseline unable to cut meat at evaluation, 10th visit starting to work on cutting meat, patient reports difficulty with cutting meat. 12/6 still has difficulty with cutting meat unless it does not require a knife.  2/21: pt able to complete with soft meats, difficulty with steak    Time 12    Period Weeks    Status On-going    Target Date 12/14/20      OT LONG TERM GOAL #3   Title Patient will demonstrate increased awareness and strategies to attend to right side to hold items without dropping items.    Baseline difficulty with detecting items in right hand at eval, forgets items are in hand, 10th visit improved but still working on 12-30-2019: minimal dropping of items this date.  20th visit continued dropping of items occasionally but with increased awareness.  Update:  remains inconsistent but does well with repeated cues    Time 12    Period Weeks    Status On-going    Target Date 12/14/20      OT LONG TERM GOAL #4   Title Patient will demonstrate independence in home exercise program for ROM, strength and coordination of right hand.    Baseline eval-patient has not been performing exercises from last episode of care and demonstrates decline, 10th visit doing exercises at home and continue to modify program. 20th visit pt continues to engage in HEP with upgrades  frequently    Time 12    Period Weeks    Status Partially Met    Target Date 12/14/20      OT LONG TERM GOAL #5   Title Pt to demonstrate improved balance with transitional movements with sit to stand from all surfaces with no loss of balance to reduce fall risk. 12/6: Pt impulsive with transitional movements and remains at risk for falls    Time 12  Period Weeks    Status On-going    Target Date 12/14/20      OT LONG TERM GOAL #6   Title Patient will demonstrate pouring a drink with the right hand with attention to right side and not knocking the cup over and spilling.      Baseline unable at eval, 10th visit starting to use right hand more and attempting to pour with right.  20th visit still has difficulty at times.  12/6: frequency is less for this but has occasional accidents with spilling if not focused on task.    Time 12    Period Weeks    Status On-going    Target Date 12/14/20                 Plan - 10/21/20 1055    Clinical Impression Statement Patient demonstrates difficulty with manipulation of small objects especially when they are 1/4 inch or smaller, able to grasp at times to pick up but has difficulty with turning and placing with small pegs.  Working towards prehension patterns with tip to tip to remove items from grid.  Handwriting has improved over time with increased legibility especially for first name, still needs large lined paper and gel pen for best results. Continue to work towards goals in plan of care to increase right hand function for daily tasks with greater independence.    OT Occupational Profile and History Comprehensive Assessment- Review of records and extensive additional review of physical, cognitive, psychosocial history related to current functional performance    Occupational Profile and client history currently impacting functional performance repeated falls, TBI, expressive aphasia, limited motion in right hand, lack of coordination from  previous CVA    Occupational performance deficits (Please refer to evaluation for details): ADL's;IADL's;Leisure    Body Structure / Function / Physical Skills ADL;Dexterity;Flexibility;ROM;Strength;Vision;Balance;Coordination;FMC;IADL;Tone;Endurance;UE functional use;Mobility    Cognitive Skills Attention;Safety Awareness;Problem Solve    Psychosocial Skills Environmental  Adaptations;Routines and Behaviors;Habits;Interpersonal Interaction    Rehab Potential Good    Clinical Decision Making Several treatment options, min-mod task modification necessary    Comorbidities Affecting Occupational Performance: May have comorbidities impacting occupational performance    Modification or Assistance to Complete Evaluation  Min-Moderate modification of tasks or assist with assess necessary to complete eval    OT Frequency 1x / week    OT Duration 12 weeks    OT Treatment/Interventions Self-care/ADL training;Visual/perceptual remediation/compensation;DME and/or AE instruction;Patient/family education;Moist Heat;Therapeutic exercise;Manual Therapy;Therapeutic activities;Neuromuscular education    Consulted and Agree with Plan of Care Patient    Family Member Consulted wife, [name]           Patient will benefit from skilled therapeutic intervention in order to improve the following deficits and impairments:   Body Structure / Function / Physical Skills: ADL,Dexterity,Flexibility,ROM,Strength,Vision,Balance,Coordination,FMC,IADL,Tone,Endurance,UE functional use,Mobility Cognitive Skills: Attention,Safety Awareness,Problem Solve Psychosocial Skills: Environmental  Technical sales engineer   Visit Diagnosis: Muscle weakness (generalized)  Other lack of coordination  Neurologic neglect syndrome  Unsteadiness on feet    Problem List Patient Active Problem List   Diagnosis Date Noted  . Sleep disturbance   . PAF (paroxysmal atrial fibrillation) ([place])    . Recurrent UTI   . Hypokalemia   . Hyponatremia   . Abnormal urine   . 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 Benign essential HTN   . Urinary retention   . History of gout   . Global aphasia   . SAH (subarachnoid hemorrhage) ([place])   .  SDH (subdural hematoma) ([place])   . Lymphocytosis   . Subdural hemorrhage following injury [facility]) 05/12/2016   Jabron Weese T Baxter Gonzalez, OTR/L, CLT  Maddock Finigan 10/21/2020, 12:52 PM  [place] MAIN [facility] SERVICES [address] [place], [place], [address] Phone: [phone]   Fax:  [phone]  Name: James [name] MRN: [id number] Date of Birth: 10-Feb-1941

## 2020-10-26 ENCOUNTER — Other Ambulatory Visit: Payer: Self-pay

## 2020-10-26 ENCOUNTER — Ambulatory Visit: Payer: PPO | Attending: Neurology | Admitting: Occupational Therapy

## 2020-10-26 DIAGNOSIS — R278 Other lack of coordination: Secondary | ICD-10-CM | POA: Diagnosis not present

## 2020-10-26 DIAGNOSIS — M6281 Muscle weakness (generalized): Secondary | ICD-10-CM | POA: Insufficient documentation

## 2020-10-26 DIAGNOSIS — R414 Neurologic neglect syndrome: Secondary | ICD-10-CM | POA: Diagnosis not present

## 2020-10-26 DIAGNOSIS — R262 Difficulty in walking, not elsewhere classified: Secondary | ICD-10-CM | POA: Diagnosis not present

## 2020-10-26 DIAGNOSIS — R2681 Unsteadiness on feet: Secondary | ICD-10-CM | POA: Insufficient documentation

## 2020-10-27 ENCOUNTER — Encounter: Payer: Self-pay | Admitting: Occupational Therapy

## 2020-10-27 NOTE — Therapy (Signed)
[place] MAIN [facility] SERVICES [address] [place], [place], [address] Phone: [phone]   Fax:  [phone]  Occupational Therapy Treatment  Patient Details  Name: James [name] MRN: [id number] Date of Birth: 1941-07-08 Referring Provider (OT): Joselyn Arrow   Encounter Date: 10/26/2020   OT End of Session - 10/31/20 2057    Visit Number 71    Number of Visits 37    Date for OT Re-Evaluation 12/14/20    OT Start Time 1001    OT Stop Time 1046    OT Time Calculation (min) 45 min    Activity Tolerance Patient tolerated treatment well    Behavior During Therapy [facility] for tasks assessed/performed           Past Medical History:  Diagnosis Date  . Alcohol abuse, in remission   . Atrial fibrillation ([place])   . Bilateral renal masses   . Closed right ankle fracture   . 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 Gait disorder   . Gout   . Hypertension   . Seizure disorder ([place])   . 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     Past Surgical History:  Procedure Laterality Date  . CRANIECTOMY FOR DEPRESSED SKULL FRACTURE  2009   with MRSA infection  . CRANIOTOMY     X 2  . HERNIA REPAIR     during childhood  . ORIF ANKLE FRACTURE Left 2012    There were no vitals filed for this visit.   Subjective Assessment - 10/31/20 2057    Subjective  Pt with no complaints, indicating he had a good weekend.  Has been doing exercises with his hand and performing handwriting.    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  He suffered another fall in 2017.  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     Patient Stated Goals Patient indicates he wants to be stronger, use his right hand for daily tasks, be as independent as possible.    Currently in Pain?  No/denies    Multiple Pain Sites No           Therapeutic Exercise:  Resistive reaching tasks in standing, multi directional with cues for weight shifting, cues for use of right hand initiation of task.  Pt placing items onto shelves at shoulder height and above.    Neuro: Pt seen with focus on manipulation skills with use of ball pegs to pick up and place into grid, increased difficulty with manipulation to turn items, able to remove with minimal difficulty.  Pt demonstrates difficulty with picking up from table versus being handed the item.   [place] discs to pick up and place into grid, difficulty with flipping items to opposing side, drops items frequently with attempts.  Continues to demonstrate difficulty with isolated finger movements of the index for turning of item.   Response to tx: Pt. Continues to work towards improve right hand functional skills.  Has difficulty with manipulation of items, can often pick up item but cannot turn item in place into grid or flip items to alternative side.  Cues for prehension patterns and has difficulty with isolated finger movements of the index finger.  Continue OT to maximize safety and independence in daily tasks.  OT Education - 10/31/20 2057    Education provided Yes    Education Details use of right hand, functional task.    Person(s) Educated Patient;Spouse    Methods Explanation;Demonstration;Verbal cues    Comprehension Verbalized understanding;Returned demonstration;Verbal cues required               OT Long Term Goals - 09/21/20 2152      OT LONG TERM GOAL #1   Title Patient will demonstrate use of utensil in right hand for self feeding with modified independence with little to no spillage.     Baseline unable at eval to hold utensil, 10th visit holding utensil, some feeding. 20th visit, feeding self consistently but still has some moderate spillage    Time 12    Period Weeks     Status Achieved      OT LONG TERM GOAL #2   Title Pt will increase hand strength in right by 5# to be able to cut food with modified independence.    Baseline unable to cut meat at evaluation, 10th visit starting to work on cutting meat, patient reports difficulty with cutting meat. 12/6 still has difficulty with cutting meat unless it does not require a knife.  2/21: pt able to complete with soft meats, difficulty with steak    Time 12    Period Weeks    Status On-going    Target Date 12/14/20      OT LONG TERM GOAL #3   Title Patient will demonstrate increased awareness and strategies to attend to right side to hold items without dropping items.    Baseline difficulty with detecting items in right hand at eval, forgets items are in hand, 10th visit improved but still working on 12-30-2019: minimal dropping of items this date.  20th visit continued dropping of items occasionally but with increased awareness.  Update:  remains inconsistent but does well with repeated cues    Time 12    Period Weeks    Status On-going    Target Date 12/14/20      OT LONG TERM GOAL #4   Title Patient will demonstrate independence in home exercise program for ROM, strength and coordination of right hand.    Baseline eval-patient has not been performing exercises from last episode of care and demonstrates decline, 10th visit doing exercises at home and continue to modify program. 20th visit pt continues to engage in HEP with upgrades frequently    Time 12    Period Weeks    Status Partially Met    Target Date 12/14/20      OT LONG TERM GOAL #5   Title Pt to demonstrate improved balance with transitional movements with sit to stand from all surfaces with no loss of balance to reduce fall risk. 12/6: Pt impulsive with transitional movements and remains at risk for falls    Time 12    Period Weeks    Status On-going    Target Date 12/14/20      OT LONG TERM GOAL #6   Title Patient will demonstrate pouring a  drink with the right hand with attention to right side and not knocking the cup over and spilling.      Baseline unable at eval, 10th visit starting to use right hand more and attempting to pour with right.  20th visit still has difficulty at times.  12/6: frequency is less for this but has occasional accidents with spilling if not focused on task.  Time 12    Period Weeks    Status On-going    Target Date 12/14/20                 Plan - 10/31/20 2058    Clinical Impression Statement Pt. Continues to work towards improve right hand functional skills.  Has difficulty with manipulation of items, can often pick up item but cannot turn item in place into grid or flip items to alternative side.  Cues for prehension patterns and has difficulty with isolated finger movements of the index finger.  Continue OT to maximize safety and independence in daily tasks.    OT Occupational Profile and History Comprehensive Assessment- Review of records and extensive additional review of physical, cognitive, psychosocial history related to current functional performance    Occupational Profile and client history currently impacting functional performance repeated falls, TBI, expressive aphasia, limited motion in right hand, lack of coordination from previous CVA    Occupational performance deficits (Please refer to evaluation for details): ADL's;IADL's;Leisure    Body Structure / Function / Physical Skills ADL;Dexterity;Flexibility;ROM;Strength;Vision;Balance;Coordination;FMC;IADL;Tone;Endurance;UE functional use;Mobility    Cognitive Skills Attention;Safety Awareness;Problem Solve    Psychosocial Skills Environmental  Adaptations;Routines and Behaviors;Habits;Interpersonal Interaction    Rehab Potential Good    Clinical Decision Making Several treatment options, min-mod task modification necessary    Comorbidities Affecting Occupational Performance: May have comorbidities impacting occupational performance     Modification or Assistance to Complete Evaluation  Min-Moderate modification of tasks or assist with assess necessary to complete eval    OT Frequency 1x / week    OT Duration 12 weeks    OT Treatment/Interventions Self-care/ADL training;Visual/perceptual remediation/compensation;DME and/or AE instruction;Patient/family education;Moist Heat;Therapeutic exercise;Manual Therapy;Therapeutic activities;Neuromuscular education    Consulted and Agree with Plan of Care Patient    Family Member Consulted wife, [name]           Patient will benefit from skilled therapeutic intervention in order to improve the following deficits and impairments:   Body Structure / Function / Physical Skills: ADL,Dexterity,Flexibility,ROM,Strength,Vision,Balance,Coordination,FMC,IADL,Tone,Endurance,UE functional use,Mobility Cognitive Skills: Attention,Safety Awareness,Problem Solve Psychosocial Skills: Environmental  Technical sales engineer   Visit Diagnosis: Muscle weakness (generalized)  Other lack of coordination  Neurologic neglect syndrome  Unsteadiness on feet  Difficulty in walking, not elsewhere classified    Problem List Patient Active Problem List   Diagnosis Date Noted  . Sleep disturbance   . PAF (paroxysmal atrial fibrillation) ([place])   . Recurrent UTI   . Hypokalemia   . Hyponatremia   . Abnormal urine   . 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 Benign essential HTN   . Urinary retention   . History of gout   . Global aphasia   . SAH (subarachnoid hemorrhage) ([place])   . SDH (subdural hematoma) ([place])   . Lymphocytosis   . Subdural hemorrhage following injury [facility]) 05/12/2016   Tomislav Micale T Lamar Meter, OTR/L, CLT  Kamrynn Melott 10/31/2020, 9:01 PM  [place] MAIN [facility] SERVICES [address] [place], [place], [address] Phone: [phone]   Fax:  [phone]  Name: James [name] MRN:  [id number] Date of Birth: 11-22-1940

## 2020-11-02 ENCOUNTER — Ambulatory Visit: Payer: PPO | Admitting: Occupational Therapy

## 2020-11-02 ENCOUNTER — Other Ambulatory Visit: Payer: Self-pay

## 2020-11-02 DIAGNOSIS — M6281 Muscle weakness (generalized): Secondary | ICD-10-CM

## 2020-11-02 DIAGNOSIS — R278 Other lack of coordination: Secondary | ICD-10-CM

## 2020-11-02 DIAGNOSIS — R2681 Unsteadiness on feet: Secondary | ICD-10-CM

## 2020-11-02 DIAGNOSIS — R414 Neurologic neglect syndrome: Secondary | ICD-10-CM

## 2020-11-02 DIAGNOSIS — R262 Difficulty in walking, not elsewhere classified: Secondary | ICD-10-CM

## 2020-11-04 ENCOUNTER — Encounter: Payer: Self-pay | Admitting: Occupational Therapy

## 2020-11-04 NOTE — Therapy (Signed)
[place] MAIN [facility] SERVICES [address] [place], [place], [address] Phone: [phone]   Fax:  [phone]  Occupational Therapy Treatment  Patient Details  Name: James [name] MRN: [id number] Date of Birth: Jul 09, 1941 Referring Provider (OT): Joselyn Arrow   Encounter Date: 11/02/2020   OT End of Session - 11/04/20 1912    Visit Number [id number]    Number of Visits 84    Date for OT Re-Evaluation 12/14/20    OT Start Time 1100    OT Stop Time 1148    OT Time Calculation (min) 48 min    Activity Tolerance Patient tolerated treatment well    Behavior During Therapy [facility] for tasks assessed/performed           Past Medical History:  Diagnosis Date  . Alcohol abuse, in remission   . Atrial fibrillation ([place])   . Bilateral renal masses   . Closed right ankle fracture   . 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 Gait disorder   . Gout   . Hypertension   . Seizure disorder ([place])   . 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     Past Surgical History:  Procedure Laterality Date  . CRANIECTOMY FOR DEPRESSED SKULL FRACTURE  2009   with MRSA infection  . CRANIOTOMY     X 2  . HERNIA REPAIR     during childhood  . ORIF ANKLE FRACTURE Left 2012    There were no vitals filed for this visit.   Subjective Assessment - 11/04/20 1911    Subjective  Pt smiling upon therapist arrival and had a card for therapist where he signed his name to say "Happy OT month!"  also helped wife to make small chocolate pretzel treats and placed in a gift box.  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  He suffered another fall in 2017.  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     Patient Stated Goals Patient indicates he wants to be stronger, use his  right hand for daily tasks, be as independent as possible.    Currently in Pain? No/denies    Multiple Pain Sites No           Therapeutic Exercise: Patient seen for bilateral UE ROM (emphasis on right UE) and strengthening with use of UBE, performed in standing, forwards/backwards, alternating levels of resistance of 3.0 to 3.5 with therapist in constant attendance to ensure grip and to adjust settings.  No rest breaks required, complete 8 continuous mins.   Neuromuscular Reeducation:  Manipulation of med to large buttons from flat surface with use of right hand to pick up and place into small container, cues for prehension patterns, able to pick up larger buttons from table, smaller ones he has to slide to the edge and use tip to tip pattern to grasp.  Patient unable to pick up small dowel pieces from the tweezer dexterity test, therapist placed into grid and patient able to remove small dowels with use of tweezers, min to moderate difficulty, cues for placement of tweezers in right hand and pinch patterns to operate.  Increased time to complete tasks.   Switched to larger plastic dowels to pick up and place into small holed opening container,  performance improved with larger object.   Response to tx: Patient demonstrates difficulty with picking up small objects from tweezer dexterity test but is able to remove with use of tweezers in right hand with min to moderate difficulty.  Demonstrates limitations in maintaining finger positioning onto tweezers at times and modulating the level of pinch required for task.  Improved performance with larger plastic dowel objects to place into small holed container.  Continue to work towards improving functional hand skills and manipulation patterns with right hand for use in daily tasks at home and in the community.                      OT Education - 11/04/20 1912    Education provided Yes    Education Details use of right hand,  functional task.    Person(s) Educated Patient;Spouse    Methods Explanation;Demonstration;Verbal cues    Comprehension Verbalized understanding;Returned demonstration;Verbal cues required               OT Long Term Goals - 09/21/20 2152      OT LONG TERM GOAL #1   Title Patient will demonstrate use of utensil in right hand for self feeding with modified independence with little to no spillage.     Baseline unable at eval to hold utensil, 10th visit holding utensil, some feeding. 20th visit, feeding self consistently but still has some moderate spillage    Time 12    Period Weeks    Status Achieved      OT LONG TERM GOAL #2   Title Pt will increase hand strength in right by 5# to be able to cut food with modified independence.    Baseline unable to cut meat at evaluation, 10th visit starting to work on cutting meat, patient reports difficulty with cutting meat. 12/6 still has difficulty with cutting meat unless it does not require a knife.  2/21: pt able to complete with soft meats, difficulty with steak    Time 12    Period Weeks    Status On-going    Target Date 12/14/20      OT LONG TERM GOAL #3   Title Patient will demonstrate increased awareness and strategies to attend to right side to hold items without dropping items.    Baseline difficulty with detecting items in right hand at eval, forgets items are in hand, 10th visit improved but still working on 12-30-2019: minimal dropping of items this date.  20th visit continued dropping of items occasionally but with increased awareness.  Update:  remains inconsistent but does well with repeated cues    Time 12    Period Weeks    Status On-going    Target Date 12/14/20      OT LONG TERM GOAL #4   Title Patient will demonstrate independence in home exercise program for ROM, strength and coordination of right hand.    Baseline eval-patient has not been performing exercises from last episode of care and demonstrates decline, 10th  visit doing exercises at home and continue to modify program. 20th visit pt continues to engage in HEP with upgrades frequently    Time 12    Period Weeks    Status Partially Met    Target Date 12/14/20      OT LONG TERM GOAL #5   Title Pt to demonstrate improved balance with transitional movements with sit to stand from all surfaces with no loss of balance to reduce fall risk. 12/6:  Pt impulsive with transitional movements and remains at risk for falls    Time 12    Period Weeks    Status On-going    Target Date 12/14/20      OT LONG TERM GOAL #6   Title Patient will demonstrate pouring a drink with the right hand with attention to right side and not knocking the cup over and spilling.      Baseline unable at eval, 10th visit starting to use right hand more and attempting to pour with right.  20th visit still has difficulty at times.  12/6: frequency is less for this but has occasional accidents with spilling if not focused on task.    Time 12    Period Weeks    Status On-going    Target Date 12/14/20                 Plan - 11/04/20 1912    Clinical Impression Statement Patient demonstrates difficulty with picking up small objects from tweezer dexterity test but is able to remove with use of tweezers in right hand with min to moderate difficulty.  Demonstrates limitations in maintaining finger positioning onto tweezers at times and modulating the level of pinch required for task.  Improved performance with larger plastic dowel objects to place into small holed container.  Continue to work towards improving functional hand skills and manipulation patterns with right hand for use in daily tasks at home and in the community.    OT Occupational Profile and History Comprehensive Assessment- Review of records and extensive additional review of physical, cognitive, psychosocial history related to current functional performance    Occupational Profile and client history currently impacting  functional performance repeated falls, TBI, expressive aphasia, limited motion in right hand, lack of coordination from previous CVA    Occupational performance deficits (Please refer to evaluation for details): ADL's;IADL's;Leisure    Body Structure / Function / Physical Skills ADL;Dexterity;Flexibility;ROM;Strength;Vision;Balance;Coordination;FMC;IADL;Tone;Endurance;UE functional use;Mobility    Cognitive Skills Attention;Safety Awareness;Problem Solve    Psychosocial Skills Environmental  Adaptations;Routines and Behaviors;Habits;Interpersonal Interaction    Rehab Potential Good    Clinical Decision Making Several treatment options, min-mod task modification necessary    Comorbidities Affecting Occupational Performance: May have comorbidities impacting occupational performance    Modification or Assistance to Complete Evaluation  Min-Moderate modification of tasks or assist with assess necessary to complete eval    OT Frequency 1x / week    OT Duration 12 weeks    OT Treatment/Interventions Self-care/ADL training;Visual/perceptual remediation/compensation;DME and/or AE instruction;Patient/family education;Moist Heat;Therapeutic exercise;Manual Therapy;Therapeutic activities;Neuromuscular education    Consulted and Agree with Plan of Care Patient    Family Member Consulted wife, [name]           Patient will benefit from skilled therapeutic intervention in order to improve the following deficits and impairments:   Body Structure / Function / Physical Skills: ADL,Dexterity,Flexibility,ROM,Strength,Vision,Balance,Coordination,FMC,IADL,Tone,Endurance,UE functional use,Mobility Cognitive Skills: Attention,Safety Awareness,Problem Solve Psychosocial Skills: Environmental  Technical sales engineer   Visit Diagnosis: Muscle weakness (generalized)  Other lack of coordination  Neurologic neglect syndrome  Unsteadiness on feet  Difficulty in  walking, not elsewhere classified    Problem List Patient Active Problem List   Diagnosis Date Noted  . Sleep disturbance   . PAF (paroxysmal atrial fibrillation) ([place])   . Recurrent UTI   . Hypokalemia   . Hyponatremia   . Abnormal urine   . 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 Benign essential HTN   .  Urinary retention   . History of gout   . Global aphasia   . SAH (subarachnoid hemorrhage) ([place])   . SDH (subdural hematoma) ([place])   . Lymphocytosis   . Subdural hemorrhage following injury [facility]) 05/12/2016   James [name], OTR/L, CLT  Airica Schwartzkopf 11/04/2020, 7:16 PM  [place] MAIN [facility] SERVICES [address] [place], [place], [address] Phone: [phone]   Fax:  [phone]  Name: James [name] MRN: [id number] Date of Birth: 12-Feb-1941

## 2020-11-05 ENCOUNTER — Encounter: Payer: PPO | Admitting: Occupational Therapy

## 2020-11-09 ENCOUNTER — Ambulatory Visit: Payer: PPO | Admitting: Occupational Therapy

## 2020-11-09 ENCOUNTER — Other Ambulatory Visit: Payer: Self-pay

## 2020-11-09 DIAGNOSIS — R2681 Unsteadiness on feet: Secondary | ICD-10-CM

## 2020-11-09 DIAGNOSIS — R414 Neurologic neglect syndrome: Secondary | ICD-10-CM

## 2020-11-09 DIAGNOSIS — R278 Other lack of coordination: Secondary | ICD-10-CM

## 2020-11-09 DIAGNOSIS — M6281 Muscle weakness (generalized): Secondary | ICD-10-CM

## 2020-11-09 DIAGNOSIS — R262 Difficulty in walking, not elsewhere classified: Secondary | ICD-10-CM

## 2020-11-11 ENCOUNTER — Encounter: Payer: Self-pay | Admitting: Occupational Therapy

## 2020-11-11 NOTE — Therapy (Signed)
[place] [facility] [address] [place], [address] Phone: [phone]   Fax:  [phone]  Occupational Therapy Treatment  Patient Details  Name: James [name] MRN: [id number] Date of Birth: August 09, 1940 Referring Provider (OT): Joselyn Arrow   Encounter Date: 11/09/2020   OT End of Session - 11/11/20 2038    Visit Number 85    Number of Visits 84    Date for OT Re-Evaluation 12/14/20    OT Start Time 1300    OT Stop Time 1345    OT Time Calculation (min) 45 min    Activity Tolerance Patient tolerated treatment well    Behavior During Therapy [facility] for tasks assessed/performed           Past Medical History:  Diagnosis Date  . Alcohol abuse, in remission   . Atrial fibrillation ([place])   . Bilateral renal masses   . Closed right ankle fracture   . 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 Gait disorder   . Gout   . Hypertension   . Seizure disorder ([place])   . 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     Past Surgical History:  Procedure Laterality Date  . CRANIECTOMY FOR DEPRESSED SKULL FRACTURE  2009   with MRSA infection  . CRANIOTOMY     X 2  . HERNIA REPAIR     during childhood  . ORIF ANKLE FRACTURE Left 2012    There were no vitals filed for this visit.   Subjective Assessment - 11/11/20 2037    Subjective  Pt came to offsite clinic for therapy treatment this date.  No complaints.    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  He suffered another fall in 2017.  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     Patient Stated Goals Patient indicates he wants to be stronger, use his right hand for daily tasks, be as independent as possible.    Currently in Pain? No/denies    Multiple Pain Sites No                         OT Treatments/Exercises (OP) - 11/11/20 2042      RUE Fluidotherapy   Number Minutes Fluidotherapy 10 Minutes    RUE Fluidotherapy Location Hand;Wrist    Comments ROM of hand and digits while in fluido          Therapeutic Exercise: Following fluidotherapy, patient seen for RUE hand ROM exercises with emphasis on thumb motion, thumb flexion, extension, radial ABD, palmar ABD with therapist cues and assist.  Manual stretching for webspace.    Neuromuscular reeducation/manipulation skills: Patient seen for manipulation of 1/2 inch size small pegs, continued difficulty picking up from flat surface, able to focus on prehension tip to tip patterns to remove from grid with cues.    Response to tx:   Patient demonstrating some tightness in web space of right hand, decreased motion in right thumb movement for ABD, and extension this also affects his prehension patterns and manipulation skills with picking up and grasping objects.  Increased motion in right hand and thumb after use of fluidotherapy.  Patient continues to work towards tip to tip prehension patterns and responds well to  cues.  Continue OT to maximize safety and independence in daily tasks.         OT Education - 11/11/20 2038    Education provided Yes    Education Details use of right hand, functional task.    Person(s) Educated Patient;Spouse    Methods Explanation;Demonstration;Verbal cues    Comprehension Verbalized understanding;Returned demonstration;Verbal cues required               OT Long Term Goals - 09/21/20 2152      OT LONG TERM GOAL #1   Title Patient will demonstrate use of utensil in right hand for self feeding with modified independence with little to no spillage.     Baseline unable at eval to hold utensil, 10th visit holding utensil, some feeding. 20th visit, feeding self consistently but still has some moderate spillage    Time 12    Period Weeks    Status Achieved       OT LONG TERM GOAL #2   Title Pt will increase hand strength in right by 5# to be able to cut food with modified independence.    Baseline unable to cut meat at evaluation, 10th visit starting to work on cutting meat, patient reports difficulty with cutting meat. 12/6 still has difficulty with cutting meat unless it does not require a knife.  2/21: pt able to complete with soft meats, difficulty with steak    Time 12    Period Weeks    Status On-going    Target Date 12/14/20      OT LONG TERM GOAL #3   Title Patient will demonstrate increased awareness and strategies to attend to right side to hold items without dropping items.    Baseline difficulty with detecting items in right hand at eval, forgets items are in hand, 10th visit improved but still working on 12-30-2019: minimal dropping of items this date.  20th visit continued dropping of items occasionally but with increased awareness.  Update:  remains inconsistent but does well with repeated cues    Time 12    Period Weeks    Status On-going    Target Date 12/14/20      OT LONG TERM GOAL #4   Title Patient will demonstrate independence in home exercise program for ROM, strength and coordination of right hand.    Baseline eval-patient has not been performing exercises from last episode of care and demonstrates decline, 10th visit doing exercises at home and continue to modify program. 20th visit pt continues to engage in HEP with upgrades frequently    Time 12    Period Weeks    Status Partially Met    Target Date 12/14/20      OT LONG TERM GOAL #5   Title Pt to demonstrate improved balance with transitional movements with sit to stand from all surfaces with no loss of balance to reduce fall risk. 12/6: Pt impulsive with transitional movements and remains at risk for falls    Time 12    Period Weeks    Status On-going    Target Date 12/14/20      OT LONG TERM GOAL #6   Title Patient will demonstrate pouring a drink with the  right hand with attention to right side and not knocking the cup over and spilling.      Baseline unable at eval, 10th visit starting to use right hand more and attempting to pour with right.  20th visit still has difficulty at times.  12/6: frequency is less for this but has occasional accidents with spilling if not focused on task.    Time 12    Period Weeks    Status On-going    Target Date 12/14/20                 Plan - 11/11/20 2039    Clinical Impression Statement Patient demonstrating some tightness in web space of right hand, decreased motion in right thumb movement for ABD, and extension this also affects his prehension patterns and manipulation skills with picking up and grasping objects.  Increased motion in right hand and thumb after use of fluidotherapy.  Patient continues to work towards tip to tip prehension patterns and responds well to cues.  Continue OT to maximize safety and independence in daily tasks.    OT Occupational Profile and History Comprehensive Assessment- Review of records and extensive additional review of physical, cognitive, psychosocial history related to current functional performance    Occupational Profile and client history currently impacting functional performance repeated falls, TBI, expressive aphasia, limited motion in right hand, lack of coordination from previous CVA    Occupational performance deficits (Please refer to evaluation for details): ADL's;IADL's;Leisure    Body Structure / Function / Physical Skills ADL;Dexterity;Flexibility;ROM;Strength;Vision;Balance;Coordination;FMC;IADL;Tone;Endurance;UE functional use;Mobility    Cognitive Skills Attention;Safety Awareness;Problem Solve    Psychosocial Skills Environmental  Adaptations;Routines and Behaviors;Habits;Interpersonal Interaction    Rehab Potential Good    Clinical Decision Making Several treatment options, min-mod task modification necessary    Comorbidities Affecting Occupational  Performance: May have comorbidities impacting occupational performance    Modification or Assistance to Complete Evaluation  Min-Moderate modification of tasks or assist with assess necessary to complete eval    OT Frequency 1x / week    OT Duration 12 weeks    OT Treatment/Interventions Self-care/ADL training;Visual/perceptual remediation/compensation;DME and/or AE instruction;Patient/family education;Moist Heat;Therapeutic exercise;Manual Therapy;Therapeutic activities;Neuromuscular education    Consulted and Agree with Plan of Care Patient    Family Member Consulted wife, [name]           Patient will benefit from skilled therapeutic intervention in order to improve the following deficits and impairments:   Body Structure / Function / Physical Skills: ADL,Dexterity,Flexibility,ROM,Strength,Vision,Balance,Coordination,FMC,IADL,Tone,Endurance,UE functional use,Mobility Cognitive Skills: Attention,Safety Awareness,Problem Solve Psychosocial Skills: Environmental  Technical sales engineer   Visit Diagnosis: Muscle weakness (generalized)  Other lack of coordination  Neurologic neglect syndrome  Unsteadiness on feet  Difficulty in walking, not elsewhere classified    Problem List Patient Active Problem List   Diagnosis Date Noted  . Sleep disturbance   . PAF (paroxysmal atrial fibrillation) ([place])   . Recurrent UTI   . Hypokalemia   . Hyponatremia   . Abnormal urine   . 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 Benign essential HTN   . Urinary retention   . History of gout   . Global aphasia   . SAH (subarachnoid hemorrhage) ([employer])   . SDH (subdural hematoma) ([place])   . Lymphocytosis   . Subdural hemorrhage following injury ([place]) 05/12/2016   Cace Osorto T Blaize Nipper, OTR/L, CLT  Cherylene Ferrufino 11/11/2020, 8:55 PM  [place] [facility] [address] [place],  [address] Phone: [phone]   Fax:  [phone]  Name: James [name] MRN: [id number] Date of Birth: 11/14/1940

## 2020-11-16 ENCOUNTER — Ambulatory Visit: Payer: PPO | Admitting: Occupational Therapy

## 2020-11-16 ENCOUNTER — Other Ambulatory Visit: Payer: Self-pay

## 2020-11-16 DIAGNOSIS — R262 Difficulty in walking, not elsewhere classified: Secondary | ICD-10-CM

## 2020-11-16 DIAGNOSIS — R414 Neurologic neglect syndrome: Secondary | ICD-10-CM

## 2020-11-16 DIAGNOSIS — R2681 Unsteadiness on feet: Secondary | ICD-10-CM

## 2020-11-16 DIAGNOSIS — M6281 Muscle weakness (generalized): Secondary | ICD-10-CM | POA: Diagnosis not present

## 2020-11-16 DIAGNOSIS — R278 Other lack of coordination: Secondary | ICD-10-CM

## 2020-11-17 ENCOUNTER — Encounter: Payer: Self-pay | Admitting: Occupational Therapy

## 2020-11-17 NOTE — Therapy (Addendum)
[place] MAIN [facility] SERVICES [address] [place], [place], [address] Phone: [phone]   Fax:  [phone]  Occupational Therapy Treatment  Patient Details  Name: James [name] MRN: [id number] Date of Birth: [birth year] Referring Provider (OT): Joselyn Arrow   Encounter Date: 11/16/2020   OT End of Session - 11/17/20 1529    Visit Number 64    Number of Visits 31    Date for OT Re-Evaluation 12/14/20    OT Start Time 1257    OT Stop Time 1345    OT Time Calculation (min) 48 min    Activity Tolerance Patient tolerated treatment well    Behavior During Therapy [facility] for tasks assessed/performed           Past Medical History:  Diagnosis Date  . Alcohol abuse, in remission   . Atrial fibrillation ([place])   . Bilateral renal masses   . Closed right ankle fracture   . 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 Gait disorder   . Gout   . Hypertension   . Seizure disorder ([place])   . 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     Past Surgical History:  Procedure Laterality Date  . CRANIECTOMY FOR DEPRESSED SKULL FRACTURE  2009   with MRSA infection  . CRANIOTOMY     X 2  . HERNIA REPAIR     during childhood  . ORIF ANKLE FRACTURE Left 2012    There were no vitals filed for this visit.   Subjective Assessment - 11/17/20 1528    Subjective  Pt indicating he is doing well, had a good weekend. 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  He suffered another fall in 2017.  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     Patient Stated Goals Patient indicates he wants to be stronger, use his right hand for daily tasks, be as independent as possible.    Currently in Pain? No/denies    Multiple Pain Sites No           Pt seen for  bilateral UE strengthening with use of UBE in standing, forwards/backwards , alternating directions and levels of resistance from 3.0 to 3.5, therapist in constant attendance to ensure grip and to adjust settings, 8 mins total   Neuromuscular reeducation with emphasis on manipulation of a variety of sized nuts and bolts, placing and removing from tool bench with occasional use of tools (wrench and hex tool).  Cues at times for prehension patterns as well as gripping patterns on tools for proper use.   Handwriting skills with use of larger lined paper for first and last name written in script with gel pen.  Cues for proper grasping pattern on pen, improving with increased trials.    Response to tx: Pt continues to work towards refining hand skills for tool use, difficulty with grasping pattern for hex tool to turn, better with use of wrench.  Speed and dexterity skills have improved significantly and able to work multiple rows.  Pt continues to require cues at times for grasping onto pen, today started with grasp at the distal end of the pen and essentially no legibility.  Once he moved more proximal, legibility improved with his first name.  Continue to work towards goals in plan of care to increase independence in right hand functional use for daily ADL and IADL skills.                     OT Education - 11/17/20 1529    Education provided Yes    Education Details use of right hand, functional task.    Person(s) Educated Patient;Spouse    Methods Explanation;Demonstration;Verbal cues    Comprehension Verbalized understanding;Returned demonstration;Verbal cues required               OT Long Term Goals - 09/21/20 2152      OT LONG TERM GOAL #1   Title Patient will demonstrate use of utensil in right hand for self feeding with modified independence with little to no spillage.     Baseline unable at eval to hold utensil, 10th visit holding utensil, some feeding. 20th visit,  feeding self consistently but still has some moderate spillage    Time 12    Period Weeks    Status Achieved      OT LONG TERM GOAL #2   Title Pt will increase hand strength in right by 5# to be able to cut food with modified independence.    Baseline unable to cut meat at evaluation, 10th visit starting to work on cutting meat, patient reports difficulty with cutting meat. 12/6 still has difficulty with cutting meat unless it does not require a knife.  2/21: pt able to complete with soft meats, difficulty with steak    Time 12    Period Weeks    Status On-going    Target Date 12/14/20      OT LONG TERM GOAL #3   Title Patient will demonstrate increased awareness and strategies to attend to right side to hold items without dropping items.    Baseline difficulty with detecting items in right hand at eval, forgets items are in hand, 10th visit improved but still working on 12-30-2019: minimal dropping of items this date.  20th visit continued dropping of items occasionally but with increased awareness.  Update:  remains inconsistent but does well with repeated cues    Time 12    Period Weeks    Status On-going    Target Date 12/14/20      OT LONG TERM GOAL #4   Title Patient will demonstrate independence in home exercise program for ROM, strength and coordination of right hand.    Baseline eval-patient has not been performing exercises from last episode of care and demonstrates decline, 10th visit doing exercises at home and continue to modify program. 20th visit pt continues to engage in HEP with upgrades frequently    Time 12    Period Weeks    Status Partially Met    Target Date 12/14/20      OT LONG TERM GOAL #5   Title Pt to demonstrate improved balance with transitional movements with sit to stand from all surfaces with no loss of balance to reduce fall risk. 12/6: Pt impulsive with transitional movements and remains at risk for falls    Time 12    Period Weeks    Status On-going     Target Date 12/14/20      OT LONG TERM GOAL #6   Title Patient will demonstrate pouring a drink with the right hand with attention to right side and not knocking the cup over and spilling.      Baseline unable at eval,  10th visit starting to use right hand more and attempting to pour with right.  20th visit still has difficulty at times.  12/6: frequency is less for this but has occasional accidents with spilling if not focused on task.    Time 12    Period Weeks    Status On-going    Target Date 12/14/20                 Plan - 11/17/20 1530   Clinical Impression Statement: Pt continues to work towards refining hand skills for tool use, difficulty with grasping pattern for hex tool to turn, better with use of wrench.  Speed and dexterity skills have improved significantly and able to work multiple rows.  Pt continues to require cues at times for grasping onto pen, today started with grasp at the distal end of the pen and essentially no legibility.  Once he moved more proximal, legibility improved with his first name.  Continue to work towards goals in plan of care to increase independence in right hand functional use for daily ADL and IADL skills.    OT Occupational Profile and History Comprehensive Assessment- Review of records and extensive additional review of physical, cognitive, psychosocial history related to current functional performance    Occupational Profile and client history currently impacting functional performance repeated falls, TBI, expressive aphasia, limited motion in right hand, lack of coordination from previous CVA    Occupational performance deficits (Please refer to evaluation for details): ADL's;IADL's;Leisure    Body Structure / Function / Physical Skills ADL;Dexterity;Flexibility;ROM;Strength;Vision;Balance;Coordination;FMC;IADL;Tone;Endurance;UE functional use;Mobility    Cognitive Skills Attention;Safety Awareness;Problem Solve    Psychosocial Skills  Environmental  Adaptations;Routines and Behaviors;Habits;Interpersonal Interaction    Rehab Potential Good    Clinical Decision Making Several treatment options, min-mod task modification necessary    Comorbidities Affecting Occupational Performance: May have comorbidities impacting occupational performance    Modification or Assistance to Complete Evaluation  Min-Moderate modification of tasks or assist with assess necessary to complete eval    OT Frequency 1x / week    OT Duration 12 weeks    OT Treatment/Interventions Self-care/ADL training;Visual/perceptual remediation/compensation;DME and/or AE instruction;Patient/family education;Moist Heat;Therapeutic exercise;Manual Therapy;Therapeutic activities;Neuromuscular education    Consulted and Agree with Plan of Care Patient    Family Member Consulted wife, [name]           Patient will benefit from skilled therapeutic intervention in order to improve the following deficits and impairments:   Body Structure / Function / Physical Skills: ADL,Dexterity,Flexibility,ROM,Strength,Vision,Balance,Coordination,FMC,IADL,Tone,Endurance,UE functional use,Mobility Cognitive Skills: Attention,Safety Awareness,Problem Solve Psychosocial Skills: Environmental  Technical sales engineer   Visit Diagnosis: Muscle weakness (generalized)  Other lack of coordination  Neurologic neglect syndrome  Unsteadiness on feet  Difficulty in walking, not elsewhere classified    Problem List Patient Active Problem List   Diagnosis Date Noted  . Sleep disturbance   . PAF (paroxysmal atrial fibrillation) ([place])   . Recurrent UTI   . Hypokalemia   . Hyponatremia   . Abnormal urine   . 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 Benign essential HTN   . Urinary retention   . History of gout   . Global aphasia   . SAH (subarachnoid hemorrhage) ([place])   . SDH (subdural hematoma) ([place])   . Lymphocytosis    . Subdural hemorrhage following injury ([place]) 05/12/2016   Tamarra Geiselman T Shakeem Stern, OTR/L, CLT  Tameika Heckmann 11/17/2020, 3:41 PM  [place] MAIN RE[facility] SERVICES [address]  [place], [place], [address] Phone: [phone]   Fax:  [phone]  Name: Quadry Kampa MRN: [id number] Date of Birth: 1940-11-25

## 2020-11-23 ENCOUNTER — Other Ambulatory Visit: Payer: Self-pay

## 2020-11-23 ENCOUNTER — Ambulatory Visit: Payer: PPO | Attending: Neurology | Admitting: Occupational Therapy

## 2020-11-23 DIAGNOSIS — R278 Other lack of coordination: Secondary | ICD-10-CM | POA: Insufficient documentation

## 2020-11-23 DIAGNOSIS — R262 Difficulty in walking, not elsewhere classified: Secondary | ICD-10-CM | POA: Insufficient documentation

## 2020-11-23 DIAGNOSIS — M6281 Muscle weakness (generalized): Secondary | ICD-10-CM | POA: Insufficient documentation

## 2020-11-23 DIAGNOSIS — R2681 Unsteadiness on feet: Secondary | ICD-10-CM | POA: Insufficient documentation

## 2020-11-23 DIAGNOSIS — R414 Neurologic neglect syndrome: Secondary | ICD-10-CM | POA: Insufficient documentation

## 2020-11-25 ENCOUNTER — Encounter: Payer: Self-pay | Admitting: Occupational Therapy

## 2020-11-25 NOTE — Therapy (Signed)
[place] MAIN [facility] SERVICES [address] [place], [place], [address] Phone: [phone]   Fax:  [phone]  Occupational Therapy Treatment/Progress Update Reporting period from 09/15/2020 to 11/23/2020  Patient Details  Name: James [name] MRN: [id number] Date of Birth: [birth year] Referring Provider (OT): James [name]   Encounter Date: 11/23/2020   OT End of Session - 11/25/20 1819    Visit Number 80    Number of Visits 51    Date for OT Re-Evaluation 12/14/20    OT Start Time 1100    OT Stop Time 1145    OT Time Calculation (min) 45 min    Activity Tolerance Patient tolerated treatment well    Behavior During Therapy [name] for tasks assessed/performed           Past Medical History:  Diagnosis Date  . Alcohol abuse, in remission   . Atrial fibrillation ([place])   . Bilateral renal masses   . Closed right ankle fracture   . 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 Gait disorder   . Gout   . Hypertension   . Seizure disorder ([place])   . 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     Past Surgical History:  Procedure Laterality Date  . CRANIECTOMY FOR DEPRESSED SKULL FRACTURE  2009   with MRSA infection  . CRANIOTOMY     X 2  . HERNIA REPAIR     during childhood  . ORIF ANKLE FRACTURE Left 2012    There were no vitals filed for this visit.   Subjective Assessment - 11/25/20 1818    Subjective  Pt's wife reporting patient has a birthday coming up this week and will be turning 5.    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  He suffered another fall in 2017.  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     Patient Stated Goals Patient indicates he wants to be stronger, use his right hand for daily tasks, be as independent as possible.     Currently in Pain? No/denies    Multiple Pain Sites No              [facility] OT Assessment - 11/25/20 1835      Assessment   Medical Diagnosis CVA/right UE weakness    Referring Provider (OT) James [name], H    Onset Date/Surgical Date 02/21/18    Hand Dominance Right      Coordination   Right 9 Hole Peg Test 3 mins 52    Left 9 Hole Peg Test 29      Hand Function   Right Hand Grip (lbs) 62    Right Hand Lateral Pinch 17 lbs    Right Hand 3 Point Pinch 15 lbs    Left Hand Grip (lbs) 64    Left Hand Lateral Pinch 20 lbs    Left 3 point pinch 17 lbs           Pt seen for progress update, measurements as noted in flow sheet above.  Pt seen for fine motor coordination tasks with emphasis on thumb finger combinations with cards, cues for proper technique with alternating flipping patterns with either thumb on top or fingers on top.   Pt performing manipulation skills with right hand to pick  up items from tabletop, turn and place into grid.  Difficulty with turning of items and requires increased cues. Handwriting skills for first and last name, large lined paper and gel pen with slightly built up grip.  Multiple trials completed, legibility fair  Response to tx: Pt has continued to make progress with increased functional use of right hand,  Pt demonstrating improved initiation of use of right hand with attempts to perform tasks.  Patient continues to demonstrate difficulty with manipulation skills to turn items in hand to be able to place in specific grid or structured area.   Pt continues to benefit from skilled OT services to maximize safety and independence in necessary daily tasks.                  OT Education - 11/25/20 1819    Education provided Yes    Education Details use of right hand, functional task.    Person(s) Educated Patient;Spouse    Methods Explanation;Demonstration;Verbal cues    Comprehension Verbalized understanding;Returned demonstration;Verbal cues  required               OT Long Term Goals - 11/25/20 1826      OT LONG TERM GOAL #1   Title Patient will demonstrate use of utensil in right hand for self feeding with modified independence with little to no spillage.     Baseline unable at eval to hold utensil, 10th visit holding utensil, some feeding. 20th visit, feeding self consistently but still has some moderate spillage    Time 12    Period Weeks    Status Achieved      OT LONG TERM GOAL #2   Title Pt will increase hand strength in right by 5# to be able to cut food with modified independence.    Baseline unable to cut meat at evaluation, 10th visit starting to work on cutting meat, patient reports difficulty with cutting meat. 12/6 still has difficulty with cutting meat unless it does not require a knife.  2/21: pt able to complete with soft meats, difficulty with steak    Time 12    Period Weeks    Status On-going    Target Date 12/14/20      OT LONG TERM GOAL #3   Title Patient will demonstrate increased awareness and strategies to attend to right side to hold items without dropping items.    Baseline difficulty with detecting items in right hand at eval, forgets items are in hand, 10th visit improved but still working on 12-30-2019: minimal dropping of items this date.  20th visit continued dropping of items occasionally but with increased awareness.  Update:  remains inconsistent but does well with repeated cues    Time 12    Period Weeks    Status On-going    Target Date 12/14/20      OT LONG TERM GOAL #4   Title Patient will demonstrate independence in home exercise program for ROM, strength and coordination of right hand.    Baseline eval-patient has not been performing exercises from last episode of care and demonstrates decline, 10th visit doing exercises at home and continue to modify program. 20th visit pt continues to engage in HEP with upgrades frequently    Time 12    Period Weeks    Status Partially Met     Target Date 12/14/20      OT LONG TERM GOAL #5   Title Pt to demonstrate improved balance with transitional movements with  sit to stand from all surfaces with no loss of balance to reduce fall risk. 12/6: Pt impulsive with transitional movements and remains at risk for falls    Time 12    Period Weeks    Status On-going    Target Date 12/14/20      OT LONG TERM GOAL #6   Title Patient will demonstrate pouring a drink with the right hand with attention to right side and not knocking the cup over and spilling.      Baseline unable at eval, 10th visit starting to use right hand more and attempting to pour with right.  20th visit still has difficulty at times.  12/6: frequency is less for this but has occasional accidents with spilling if not focused on task.    Time 12    Period Weeks    Status On-going    Target Date 12/14/20                 Plan - 11/25/20 1823    Clinical Impression Statement Pt has continued to make progress with increased functional use of right hand,  Pt demonstrating improved initiation of use of right hand with attempts to perform tasks.  Patient continues to demonstrate difficulty with manipulation skills to turn items in hand to be able to place in specific grid or structured area.    Pt continues to benefit from skilled OT services to maximize safety and independence in necessary daily tasks.    OT Occupational Profile and History Comprehensive Assessment- Review of records and extensive additional review of physical, cognitive, psychosocial history related to current functional performance    Occupational Profile and client history currently impacting functional performance repeated falls, TBI, expressive aphasia, limited motion in right hand, lack of coordination from previous CVA    Occupational performance deficits (Please refer to evaluation for details): ADL's;IADL's;Leisure    Body Structure / Function / Physical Skills  ADL;Dexterity;Flexibility;ROM;Strength;Vision;Balance;Coordination;FMC;IADL;Tone;Endurance;UE functional use;Mobility    Cognitive Skills Attention;Safety Awareness;Problem Solve    Psychosocial Skills Environmental  Adaptations;Routines and Behaviors;Habits;Interpersonal Interaction    Rehab Potential Good    Clinical Decision Making Several treatment options, min-mod task modification necessary    Comorbidities Affecting Occupational Performance: May have comorbidities impacting occupational performance    Modification or Assistance to Complete Evaluation  Min-Moderate modification of tasks or assist with assess necessary to complete eval    OT Frequency 1x / week    OT Duration 12 weeks    OT Treatment/Interventions Self-care/ADL training;Visual/perceptual remediation/compensation;DME and/or AE instruction;Patient/family education;Moist Heat;Therapeutic exercise;Manual Therapy;Therapeutic activities;Neuromuscular education    Consulted and Agree with Plan of Care Patient    Family Member Consulted wife, [name]           Patient will benefit from skilled therapeutic intervention in order to improve the following deficits and impairments:   Body Structure / Function / Physical Skills: ADL,Dexterity,Flexibility,ROM,Strength,Vision,Balance,Coordination,FMC,IADL,Tone,Endurance,UE functional use,Mobility Cognitive Skills: Attention,Safety Awareness,Problem Solve Psychosocial Skills: Environmental  Technical sales engineer   Visit Diagnosis: Muscle weakness (generalized)  Other lack of coordination  Neurologic neglect syndrome  Unsteadiness on feet  Difficulty in walking, not elsewhere classified    Problem List Patient Active Problem List   Diagnosis Date Noted  . Sleep disturbance   . PAF (paroxysmal atrial fibrillation) ([place])   . Recurrent UTI   . Hypokalemia   . Hyponatremia   . Abnormal urine   . 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 Benign essential HTN   .  Urinary retention   . History of gout   . Global aphasia   . SAH (subarachnoid hemorrhage) ([place])   . SDH (subdural hematoma) ([place])   . Lymphocytosis   . Subdural hemorrhage following injury [facility]) 05/12/2016   Arjun Hard T Analeya Luallen, OTR/L, CLT  Elmer Boutelle 11/25/2020, 6:45 PM  [place] MAIN [facility] SERVICES [address] [place], [place], [address] Phone: [phone]   Fax:  [phone]  Name: James [name] MRN: [id number] Date of Birth: Dec 14, 1940

## 2020-11-30 ENCOUNTER — Other Ambulatory Visit: Payer: Self-pay

## 2020-11-30 ENCOUNTER — Encounter: Payer: Self-pay | Admitting: Occupational Therapy

## 2020-11-30 ENCOUNTER — Ambulatory Visit: Payer: PPO | Admitting: Occupational Therapy

## 2020-11-30 DIAGNOSIS — R414 Neurologic neglect syndrome: Secondary | ICD-10-CM

## 2020-11-30 DIAGNOSIS — R262 Difficulty in walking, not elsewhere classified: Secondary | ICD-10-CM

## 2020-11-30 DIAGNOSIS — M6281 Muscle weakness (generalized): Secondary | ICD-10-CM

## 2020-11-30 DIAGNOSIS — R2681 Unsteadiness on feet: Secondary | ICD-10-CM

## 2020-11-30 DIAGNOSIS — R278 Other lack of coordination: Secondary | ICD-10-CM

## 2020-11-30 NOTE — Therapy (Signed)
[place] MAIN [facility] SERVICES [address] [place], [place], [address] Phone: [phone]   Fax:  [phone]  Occupational Therapy Treatment  Patient Details  Name: James [name] MRN: [id number] Date of Birth: [birth year] Referring Provider (OT): Joselyn Arrow   Encounter Date: 11/30/2020   OT End of Session - 12/02/20 1805    Visit Number 42    Number of Visits 43    Date for OT Re-Evaluation 12/14/20    OT Start Time 1055    OT Stop Time 1145    OT Time Calculation (min) 50 min    Activity Tolerance Patient tolerated treatment well    Behavior During Therapy [facility] for tasks assessed/performed           Past Medical History:  Diagnosis Date  . Alcohol abuse, in remission   . Atrial fibrillation ([place])   . Bilateral renal masses   . Closed right ankle fracture   . 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 Gait disorder   . Gout   . Hypertension   . Seizure disorder ([place])   . 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     Past Surgical History:  Procedure Laterality Date  . CRANIECTOMY FOR DEPRESSED SKULL FRACTURE  2009   with MRSA infection  . CRANIOTOMY     X 2  . HERNIA REPAIR     during childhood  . ORIF ANKLE FRACTURE Left 2012    There were no vitals filed for this visit.   Subjective Assessment - 12/02/20 1805    Subjective  Pt indicating he had a great time for his 80th birthday over the weekend.    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  He suffered another fall in 2017.  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     Patient Stated Goals Patient indicates he wants to be stronger, use his right hand for daily tasks, be as independent as possible.          Neuromuscular Reeducation: Patient seen this date with emphasis  on fine motor coordination skills with use of right hand to pick up checker sized objects from tabletop, cues for prehension patterns for tip to tip.  Pt picking up and placing into grid placed in elevated plane of motion.  Pt demonstrates difficulty with manipulation of objects to move to fingertips, still tends to hold in a lateral pinch.  If therapist hands object to patient he is able to demonstrate the tip to tip pattern.  Multiple trials and reps performed, filling the whole board.   Tool use with right hand for wrench to place onto bolt heads and turn to loosen, cues for placement of tool in right hand.    Response to tx:   Patient progressing well and continues to work towards reeducation of right hand with manipulation skills.  Patient demonstrates difficulty with tip to tip patterns especially when picking up from flat surfaces.  Decreased coordination for tool use in right hand, able to correct with cues and has difficulty with maintaining a consistent grasp.  Continue to work towards goals in plan of care to impact right functional hand activities.  OT Education - 12/02/20 1805    Education provided Yes    Education Details use of right hand, functional task.    Person(s) Educated Patient;Spouse    Methods Explanation;Demonstration;Verbal cues    Comprehension Verbalized understanding;Returned demonstration;Verbal cues required               OT Long Term Goals - 11/25/20 1826      OT LONG TERM GOAL #1   Title Patient will demonstrate use of utensil in right hand for self feeding with modified independence with little to no spillage.     Baseline unable at eval to hold utensil, 10th visit holding utensil, some feeding. 20th visit, feeding self consistently but still has some moderate spillage    Time 12    Period Weeks    Status Achieved      OT LONG TERM GOAL #2   Title Pt will increase hand strength in right by 5# to be able to cut food  with modified independence.    Baseline unable to cut meat at evaluation, 10th visit starting to work on cutting meat, patient reports difficulty with cutting meat. 12/6 still has difficulty with cutting meat unless it does not require a knife.  2/21: pt able to complete with soft meats, difficulty with steak    Time 12    Period Weeks    Status On-going    Target Date 12/14/20      OT LONG TERM GOAL #3   Title Patient will demonstrate increased awareness and strategies to attend to right side to hold items without dropping items.    Baseline difficulty with detecting items in right hand at eval, forgets items are in hand, 10th visit improved but still working on 12-30-2019: minimal dropping of items this date.  20th visit continued dropping of items occasionally but with increased awareness.  Update:  remains inconsistent but does well with repeated cues    Time 12    Period Weeks    Status On-going    Target Date 12/14/20      OT LONG TERM GOAL #4   Title Patient will demonstrate independence in home exercise program for ROM, strength and coordination of right hand.    Baseline eval-patient has not been performing exercises from last episode of care and demonstrates decline, 10th visit doing exercises at home and continue to modify program. 20th visit pt continues to engage in HEP with upgrades frequently    Time 12    Period Weeks    Status Partially Met    Target Date 12/14/20      OT LONG TERM GOAL #5   Title Pt to demonstrate improved balance with transitional movements with sit to stand from all surfaces with no loss of balance to reduce fall risk. 12/6: Pt impulsive with transitional movements and remains at risk for falls    Time 12    Period Weeks    Status On-going    Target Date 12/14/20      OT LONG TERM GOAL #6   Title Patient will demonstrate pouring a drink with the right hand with attention to right side and not knocking the cup over and spilling.      Baseline unable  at eval, 10th visit starting to use right hand more and attempting to pour with right.  20th visit still has difficulty at times.  12/6: frequency is less for this but has occasional accidents with spilling if not focused on task.  Time 12    Period Weeks    Status On-going    Target Date 12/14/20                 Plan - 12/02/20 1806    Clinical Impression Statement Patient progressing well and continues to work towards reeducation of right hand with manipulation skills.  Patient demonstrates difficulty with tip to tip patterns especially when picking up from flat surfaces.  Decreased coordination for tool use in right hand, able to correct with cues and has difficulty with maintaining a consistent grasp.  Continue to work towards goals in plan of care to impact right functional hand activities.    OT Occupational Profile and History Comprehensive Assessment- Review of records and extensive additional review of physical, cognitive, psychosocial history related to current functional performance    Occupational Profile and client history currently impacting functional performance repeated falls, TBI, expressive aphasia, limited motion in right hand, lack of coordination from previous CVA    Occupational performance deficits (Please refer to evaluation for details): ADL's;IADL's;Leisure    Body Structure / Function / Physical Skills ADL;Dexterity;Flexibility;ROM;Strength;Vision;Balance;Coordination;FMC;IADL;Tone;Endurance;UE functional use;Mobility    Cognitive Skills Attention;Safety Awareness;Problem Solve    Psychosocial Skills Environmental  Adaptations;Routines and Behaviors;Habits;Interpersonal Interaction    Rehab Potential Good    Clinical Decision Making Several treatment options, min-mod task modification necessary    Comorbidities Affecting Occupational Performance: May have comorbidities impacting occupational performance    Modification or Assistance to Complete Evaluation   Min-Moderate modification of tasks or assist with assess necessary to complete eval    OT Frequency 1x / week    OT Duration 12 weeks    OT Treatment/Interventions Self-care/ADL training;Visual/perceptual remediation/compensation;DME and/or AE instruction;Patient/family education;Moist Heat;Therapeutic exercise;Manual Therapy;Therapeutic activities;Neuromuscular education    Consulted and Agree with Plan of Care Patient    Family Member Consulted wife, [name]           Patient will benefit from skilled therapeutic intervention in order to improve the following deficits and impairments:   Body Structure / Function / Physical Skills: ADL,Dexterity,Flexibility,ROM,Strength,Vision,Balance,Coordination,FMC,IADL,Tone,Endurance,UE functional use,Mobility Cognitive Skills: Attention,Safety Awareness,Problem Solve Psychosocial Skills: Environmental  Technical sales engineer   Visit Diagnosis: Muscle weakness (generalized)  Other lack of coordination  Neurologic neglect syndrome  Unsteadiness on feet  Difficulty in walking, not elsewhere classified    Problem List Patient Active Problem List   Diagnosis Date Noted  . Sleep disturbance   . PAF (paroxysmal atrial fibrillation) ([place])   . Recurrent UTI   . Hypokalemia   . Hyponatremia   . Abnormal urine   . 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 Benign essential HTN   . Urinary retention   . History of gout   . Global aphasia   . SAH (subarachnoid hemorrhage) ([place])   . SDH (subdural hematoma) ([place])   . Lymphocytosis   . Subdural hemorrhage following injury ([place]) 05/12/2016   Belicia Difatta T Loman Logan, OTR/L, CLT  Jeaneane Adamec 12/02/2020, 6:18 PM  [place] MAIN [facility] SERVICES [address] [place], [place], [address] Phone: [phone]   Fax:  [phone]  Name: James [name] MRN: [id number] Date of Birth: September 30, 1940

## 2020-12-07 ENCOUNTER — Ambulatory Visit: Payer: PPO | Admitting: Occupational Therapy

## 2020-12-14 ENCOUNTER — Ambulatory Visit: Payer: PPO | Admitting: Occupational Therapy

## 2020-12-22 DIAGNOSIS — E7849 Other hyperlipidemia: Secondary | ICD-10-CM | POA: Diagnosis not present

## 2020-12-22 DIAGNOSIS — E538 Deficiency of other specified B group vitamins: Secondary | ICD-10-CM | POA: Diagnosis not present

## 2020-12-22 DIAGNOSIS — I10 Essential (primary) hypertension: Secondary | ICD-10-CM | POA: Diagnosis not present

## 2020-12-22 DIAGNOSIS — D696 Thrombocytopenia, unspecified: Secondary | ICD-10-CM | POA: Diagnosis not present

## 2020-12-28 ENCOUNTER — Ambulatory Visit: Payer: PPO | Attending: Neurology | Admitting: Occupational Therapy

## 2020-12-28 ENCOUNTER — Other Ambulatory Visit: Payer: Self-pay

## 2020-12-28 ENCOUNTER — Encounter: Payer: Self-pay | Admitting: Occupational Therapy

## 2020-12-28 DIAGNOSIS — R262 Difficulty in walking, not elsewhere classified: Secondary | ICD-10-CM | POA: Insufficient documentation

## 2020-12-28 DIAGNOSIS — R2681 Unsteadiness on feet: Secondary | ICD-10-CM | POA: Diagnosis not present

## 2020-12-28 DIAGNOSIS — R278 Other lack of coordination: Secondary | ICD-10-CM | POA: Diagnosis not present

## 2020-12-28 DIAGNOSIS — M6281 Muscle weakness (generalized): Secondary | ICD-10-CM | POA: Diagnosis not present

## 2020-12-28 DIAGNOSIS — R414 Neurologic neglect syndrome: Secondary | ICD-10-CM | POA: Insufficient documentation

## 2020-12-30 DIAGNOSIS — Z8673 Personal history of transient ischemic attack (TIA), and cerebral infarction without residual deficits: Secondary | ICD-10-CM | POA: Diagnosis not present

## 2020-12-30 DIAGNOSIS — N2889 Other specified disorders of kidney and ureter: Secondary | ICD-10-CM | POA: Diagnosis not present

## 2020-12-30 DIAGNOSIS — D696 Thrombocytopenia, unspecified: Secondary | ICD-10-CM | POA: Diagnosis not present

## 2020-12-30 DIAGNOSIS — E538 Deficiency of other specified B group vitamins: Secondary | ICD-10-CM | POA: Diagnosis not present

## 2020-12-30 DIAGNOSIS — M1A09X0 Idiopathic chronic gout, multiple sites, without tophus (tophi): Secondary | ICD-10-CM | POA: Diagnosis not present

## 2020-12-30 DIAGNOSIS — G40209 Localization-related (focal) (partial) symptomatic epilepsy and epileptic syndromes with complex partial seizures, not intractable, without status epilepticus: Secondary | ICD-10-CM | POA: Diagnosis not present

## 2020-12-30 DIAGNOSIS — I10 Essential (primary) hypertension: Secondary | ICD-10-CM | POA: Diagnosis not present

## 2020-12-30 DIAGNOSIS — F3342 Major depressive disorder, recurrent, in full remission: Secondary | ICD-10-CM | POA: Diagnosis not present

## 2020-12-30 DIAGNOSIS — E7849 Other hyperlipidemia: Secondary | ICD-10-CM | POA: Diagnosis not present

## 2020-12-30 DIAGNOSIS — I4891 Unspecified atrial fibrillation: Secondary | ICD-10-CM | POA: Diagnosis not present

## 2020-12-30 DIAGNOSIS — I2510 Atherosclerotic heart disease of native coronary artery without angina pectoris: Secondary | ICD-10-CM | POA: Diagnosis not present

## 2020-12-31 NOTE — Therapy (Signed)
[place] MAIN [facility] SERVICES [address] [place], [place], [address] Phone: [phone]   Fax:  [phone]  Occupational Therapy Treatment/REcertification  Patient Details  Name: James [name] MRN: [id number] Date of Birth: 02-Sep-1940 Referring Provider (OT): Joselyn Arrow   Encounter Date: 12/28/2020   OT End of Session - 12/31/20 1743     Visit Number 82    Number of Visits 76    Date for OT Re-Evaluation 03/24/21    OT Start Time 1100    OT Stop Time 1150    OT Time Calculation (min) 50 min    Activity Tolerance Patient tolerated treatment well    Behavior During Therapy [facility] for tasks assessed/performed             Past Medical History:  Diagnosis Date   Alcohol abuse, in remission    Atrial fibrillation ([place])    Bilateral renal masses    Closed right ankle fracture    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     Past Surgical History:  Procedure Laterality Date   CRANIECTOMY FOR DEPRESSED SKULL FRACTURE  2009   with MRSA infection   CRANIOTOMY     X 2   HERNIA REPAIR     during childhood   ORIF ANKLE FRACTURE Left 2012    There were no vitals filed for this visit.   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  He suffered another fall in 2017.  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     Patient Stated Goals Patient indicates he wants to be stronger, use his right hand for daily tasks, be as independent as possible.    Currently in Pain? No/denies    Multiple Pain Sites No                 [facility] OT Assessment - 12/31/20 1745       Assessment   Medical Diagnosis CVA/right UE weakness    Referring Provider (OT) Manuella Ghazi, H    Onset Date/Surgical Date 02/21/18    Hand Dominance Right    Prior Therapy OT, PT      Precautions   Precautions Fall      Coordination   Left 9 Hole Peg Test 32      Hand Function   Right Hand Grip (lbs) 60    Right Hand Lateral Pinch 13 lbs    Right Hand 3 Point Pinch 13 lbs   slipping on meter   Left Hand Grip (lbs) 70    Left Hand Lateral Pinch 20 lbs    Left 3 point pinch 20 lbs             Pt seen for reassessment of right hand function, see flow sheet above for details.    Goals updated to reflect progress.    Patient had difficulty with prehension patterns to be able to perform 9 hole peg test this date.    Pt seen for facilitation  of prehension patterns for grasping, picking up and attempts to manipulate objects.  Pt tends to revert to a lateral pinch rather than tip to tip pinch and requires moderate cues, demonstration and tactile guiding.   Handwriting this date for first and last name with a variety of types of pens, cues for tripod grasping patterns and large lined paper.     Response to tx:   Patient has missed therapy the last few weeks due to being out of town, holiday and therapist being out sick.  Pt with slight decline in hand function when not attending therapy consistently for the past few weeks.  Discussed the effect of this and need to continue therapy and focus on consistency with home program and engaging right hand in daily tasks.  Discussed this with wife as well and she is in agreement.  Pt agreeable to continue therapy one time a week to continue with focus on right hand function, increased right sided awareness and greater independence in daily tasks.                   OT Education - 12/31/20 1743     Education provided Yes    Education Details use of right hand, functional task.     Person(s) Educated Patient;Spouse    Methods Explanation;Demonstration;Verbal cues    Comprehension Verbalized understanding;Returned demonstration;Verbal cues required                 OT Long Term Goals - 11/25/20 1826       OT LONG TERM GOAL #1   Title Patient will demonstrate use of utensil in right hand for self feeding with modified independence with little to no spillage.     Baseline unable at eval to hold utensil, 10th visit holding utensil, some feeding. 20th visit, feeding self consistently but still has some moderate spillage    Time 12    Period Weeks    Status Achieved      OT LONG TERM GOAL #2   Title Pt will increase hand strength in right by 5# to be able to cut food with modified independence.    Baseline unable to cut meat at evaluation, 10th visit starting to work on cutting meat, patient reports difficulty with cutting meat. 12/6 still has difficulty with cutting meat unless it does not require a knife.  2/21: pt able to complete with soft meats, difficulty with steak    Time 12    Period Weeks    Status On-going    Target Date 12/14/20      OT LONG TERM GOAL #3   Title Patient will demonstrate increased awareness and strategies to attend to right side to hold items without dropping items.    Baseline difficulty with detecting items in right hand at eval, forgets items are in hand, 10th visit improved but still working on 12-30-2019: minimal dropping of items this date.  20th visit continued dropping of items occasionally but with increased awareness.  Update:  remains inconsistent but does well with repeated cues    Time 12    Period Weeks    Status On-going    Target Date 12/14/20      OT LONG TERM GOAL #4   Title Patient will demonstrate independence in home exercise program for ROM, strength and coordination of right hand.    Baseline eval-patient has not been performing exercises from last episode of care and demonstrates decline, 10th visit doing  exercises at home and  continue to modify program. 20th visit pt continues to engage in HEP with upgrades frequently    Time 12    Period Weeks    Status Partially Met    Target Date 12/14/20      OT LONG TERM GOAL #5   Title Pt to demonstrate improved balance with transitional movements with sit to stand from all surfaces with no loss of balance to reduce fall risk. 12/6: Pt impulsive with transitional movements and remains at risk for falls    Time 12    Period Weeks    Status On-going    Target Date 12/14/20      OT LONG TERM GOAL #6   Title Patient will demonstrate pouring a drink with the right hand with attention to right side and not knocking the cup over and spilling.      Baseline unable at eval, 10th visit starting to use right hand more and attempting to pour with right.  20th visit still has difficulty at times.  12/6: frequency is less for this but has occasional accidents with spilling if not focused on task.    Time 12    Period Weeks    Status On-going    Target Date 12/14/20                   Plan - 12/31/20 1744     OT Occupational Profile and History Comprehensive Assessment- Review of records and extensive additional review of physical, cognitive, psychosocial history related to current functional performance    Occupational Profile and client history currently impacting functional performance repeated falls, TBI, expressive aphasia, limited motion in right hand, lack of coordination from previous CVA    Occupational performance deficits (Please refer to evaluation for details): ADL's;IADL's;Leisure    Body Structure / Function / Physical Skills ADL;Dexterity;Flexibility;ROM;Strength;Vision;Balance;Coordination;FMC;IADL;Tone;Endurance;UE functional use;Mobility    Cognitive Skills Attention;Safety Awareness;Problem Solve    Psychosocial Skills Environmental  Adaptations;Routines and Behaviors;Habits;Interpersonal Interaction    Rehab Potential Good     Clinical Decision Making Several treatment options, min-mod task modification necessary    Comorbidities Affecting Occupational Performance: May have comorbidities impacting occupational performance    Modification or Assistance to Complete Evaluation  Min-Moderate modification of tasks or assist with assess necessary to complete eval    OT Frequency 1x / week    OT Duration 12 weeks    OT Treatment/Interventions Self-care/ADL training;Visual/perceptual remediation/compensation;DME and/or AE instruction;Patient/family education;Moist Heat;Therapeutic exercise;Manual Therapy;Therapeutic activities;Neuromuscular education    Consulted and Agree with Plan of Care Patient    Family Member Consulted wife, [name]             Patient will benefit from skilled therapeutic intervention in order to improve the following deficits and impairments:   Body Structure / Function / Physical Skills: ADL, Dexterity, Flexibility, ROM, Strength, Vision, Balance, Coordination, FMC, IADL, Tone, Endurance, UE functional use, Mobility Cognitive Skills: Attention, Safety Awareness, Problem Solve Psychosocial Skills: Environmental  Adaptations, Routines and Behaviors, Habits, Interpersonal Interaction   Visit Diagnosis: Muscle weakness (generalized)  Other lack of coordination  Neurologic neglect syndrome  Unsteadiness on feet    Problem List Patient Active Problem List   Diagnosis Date Noted   Sleep disturbance    PAF (paroxysmal atrial fibrillation) ([facility])    Recurrent UTI    Hypokalemia    Hyponatremia    Abnormal urine    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 [place], [place], [address] Phone: [phone]   Fax:  [phone]  Name: James [name] MRN: [id number] Date of Birth: 10-13-1940

## 2021-01-04 ENCOUNTER — Other Ambulatory Visit: Payer: Self-pay

## 2021-01-04 ENCOUNTER — Ambulatory Visit: Payer: PPO | Admitting: Occupational Therapy

## 2021-01-04 DIAGNOSIS — R278 Other lack of coordination: Secondary | ICD-10-CM

## 2021-01-04 DIAGNOSIS — R2681 Unsteadiness on feet: Secondary | ICD-10-CM

## 2021-01-04 DIAGNOSIS — M6281 Muscle weakness (generalized): Secondary | ICD-10-CM

## 2021-01-04 DIAGNOSIS — R414 Neurologic neglect syndrome: Secondary | ICD-10-CM

## 2021-01-04 DIAGNOSIS — R262 Difficulty in walking, not elsewhere classified: Secondary | ICD-10-CM

## 2021-01-04 NOTE — Therapy (Signed)
[place] MAIN [facility] SERVICES [address] [place], [place], [address] Phone: [phone]   Fax:  [phone]  Occupational Therapy Treatment  Patient Details  Name: James [name] MRN: [id number] Date of Birth: 1941-04-24 Referring Provider (OT): Joselyn Arrow   Encounter Date: 01/04/2021   OT End of Session - 01/08/21 1144     Visit Number 5    Number of Visits 48    Date for OT Re-Evaluation 03/24/21    OT Start Time 1055    OT Stop Time 1144    OT Time Calculation (min) 49 min    Activity Tolerance Patient tolerated treatment well    Behavior During Therapy [facility] for tasks assessed/performed             Past Medical History:  Diagnosis Date   Alcohol abuse, in remission    Atrial fibrillation ([place])    Bilateral renal masses    Closed right ankle fracture    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     Past Surgical History:  Procedure Laterality Date   CRANIECTOMY FOR DEPRESSED SKULL FRACTURE  2009   with MRSA infection   CRANIOTOMY     X 2   HERNIA REPAIR     during childhood   ORIF ANKLE FRACTURE Left 2012    There were no vitals filed for this visit.   Subjective Assessment - 01/08/21 1143     Subjective  Pt indicating he is having a good week and he did his handwriting exercises yesterday.    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  He suffered another fall in 2017.  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     Patient Stated Goals Patient indicates he wants to be stronger, use his right hand for daily tasks, be as independent as possible.    Currently in Pain? No/denies    Multiple Pain Sites No               Neuromuscular reeducation: Pt seen for tool use with wrench and hex tool to complete large nuts/bolts on work bench to remove and place utilizing bilateral UEs, focus on right hand with tool use or stabilizing when reaching underneath to manage nuts with left hand.  Pt requires cues for prehension patterns and placement of fingers on top of bolt types.  Increased time allow to perform task.  Completed 3 rows of task placing and removing.    Response to tx:   Pt requires cues and assist for use of hand tools, decreased prehension patterns/manipulation and grasping with task.  Therapist demonstration followed by tactile cues at times.  Pt becoming faster with task but continues to lack some of the precision skill during task.  Continue OT towards goals in plan of care to increase independence in necessary daily tasks.                      OT Education - 01/08/21 1144     Education provided Yes    Education Details use of right hand, functional task.    Person(s) Educated Patient;Spouse  Methods Explanation;Demonstration;Verbal cues    Comprehension Verbalized understanding;Returned demonstration;Verbal cues required                 OT Long Term Goals - 12/31/20 1754       OT LONG TERM GOAL #1   Title Patient will demonstrate use of utensil in right hand for self feeding with modified independence with little to no spillage.     Baseline unable at eval to hold utensil, 10th visit holding utensil, some feeding. 20th visit, feeding self consistently but still has some moderate spillage    Time 12    Period Weeks    Status Achieved      OT LONG TERM GOAL #2   Title Pt will increase hand strength in right by 5# to be able to cut food with modified independence.    Baseline unable to cut meat at evaluation, 10th visit starting to work on cutting meat, patient reports difficulty with cutting meat. 12/6 still has difficulty with cutting meat unless it does  not require a knife.  2/21: pt able to complete with soft meats, difficulty with steak    Time 12    Period Weeks    Status On-going    Target Date 03/24/21      OT LONG TERM GOAL #3   Title Patient will demonstrate increased awareness and strategies to attend to right side to hold items without dropping items.    Baseline difficulty with detecting items in right hand at eval, forgets items are in hand, 10th visit improved but still working on 12-30-2019: minimal dropping of items this date.  20th visit continued dropping of items occasionally but with increased awareness.  Update:  remains inconsistent but does well with repeated cues    Time 12    Period Weeks    Status On-going    Target Date 03/24/21      OT LONG TERM GOAL #4   Title Patient will demonstrate independence in home exercise program for ROM, strength and coordination of right hand.    Baseline eval-patient has not been performing exercises from last episode of care and demonstrates decline, 10th visit doing exercises at home and continue to modify program. 20th visit pt continues to engage in HEP with upgrades frequently    Time 12    Period Weeks    Status Partially Met    Target Date 03/24/21      OT LONG TERM GOAL #5   Title Pt to demonstrate improved balance with transitional movements with sit to stand from all surfaces with no loss of balance to reduce fall risk. 12/6: Pt impulsive with transitional movements and remains at risk for falls    Time 12    Period Weeks    Status On-going    Target Date 03/24/21      OT LONG TERM GOAL #6   Title Patient will demonstrate pouring a drink with the right hand with attention to right side and not knocking the cup over and spilling.      Baseline unable at eval, 10th visit starting to use right hand more and attempting to pour with right.  20th visit still has difficulty at times.  12/6: frequency is less for this but has occasional accidents with spilling if not focused on  task.    Time 12    Period Weeks    Status On-going    Target Date 03/24/21  Plan - 01/08/21 1145     Clinical Impression Statement Pt requires cues and assist for use of hand tools, decreased prehension patterns/manipulation and grasping with task.  Therapist demonstration followed by tactile cues at times.  Pt becoming faster with task but continues to lack some of the precision skill during task.  Continue OT towards goals in plan of care to increase independence in necessary daily tasks.    OT Occupational Profile and History Comprehensive Assessment- Review of records and extensive additional review of physical, cognitive, psychosocial history related to current functional performance    Occupational Profile and client history currently impacting functional performance repeated falls, TBI, expressive aphasia, limited motion in right hand, lack of coordination from previous CVA    Occupational performance deficits (Please refer to evaluation for details): ADL's;IADL's;Leisure    Body Structure / Function / Physical Skills ADL;Dexterity;Flexibility;ROM;Strength;Vision;Balance;Coordination;FMC;IADL;Tone;Endurance;UE functional use;Mobility    Cognitive Skills Attention;Safety Awareness;Problem Solve    Psychosocial Skills Environmental  Adaptations;Routines and Behaviors;Habits;Interpersonal Interaction    Rehab Potential Good    Clinical Decision Making Several treatment options, min-mod task modification necessary    Comorbidities Affecting Occupational Performance: May have comorbidities impacting occupational performance    Modification or Assistance to Complete Evaluation  Min-Moderate modification of tasks or assist with assess necessary to complete eval    OT Frequency 1x / week    OT Duration 12 weeks    OT Treatment/Interventions Self-care/ADL training;Visual/perceptual remediation/compensation;DME and/or AE instruction;Patient/family education;Moist  Heat;Therapeutic exercise;Manual Therapy;Therapeutic activities;Neuromuscular education    Consulted and Agree with Plan of Care Patient    Family Member Consulted wife, [name]             Patient will benefit from skilled therapeutic intervention in order to improve the following deficits and impairments:   Body Structure / Function / Physical Skills: ADL, Dexterity, Flexibility, ROM, Strength, Vision, Balance, Coordination, FMC, IADL, Tone, Endurance, UE functional use, Mobility Cognitive Skills: Attention, Safety Awareness, Problem Solve Psychosocial Skills: Environmental  Adaptations, Routines and Behaviors, Habits, Interpersonal Interaction   Visit Diagnosis: Muscle weakness (generalized)  Other lack of coordination  Neurologic neglect syndrome  Unsteadiness on feet  Difficulty in walking, not elsewhere classified    Problem List Patient Active Problem List   Diagnosis Date Noted   Sleep disturbance    PAF (paroxysmal atrial fibrillation) (HCC)    Recurrent UTI    Hypokalemia    Hyponatremia    Abnormal urine    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

## 2021-01-08 ENCOUNTER — Encounter: Payer: Self-pay | Admitting: Occupational Therapy

## 2021-01-11 ENCOUNTER — Ambulatory Visit: Payer: PPO | Admitting: Occupational Therapy

## 2021-01-18 ENCOUNTER — Other Ambulatory Visit: Payer: Self-pay

## 2021-01-18 ENCOUNTER — Encounter: Payer: Self-pay | Admitting: Occupational Therapy

## 2021-01-18 ENCOUNTER — Ambulatory Visit: Payer: PPO | Admitting: Occupational Therapy

## 2021-01-18 DIAGNOSIS — R278 Other lack of coordination: Secondary | ICD-10-CM

## 2021-01-18 DIAGNOSIS — M6281 Muscle weakness (generalized): Secondary | ICD-10-CM | POA: Diagnosis not present

## 2021-01-18 DIAGNOSIS — R414 Neurologic neglect syndrome: Secondary | ICD-10-CM

## 2021-01-18 DIAGNOSIS — R262 Difficulty in walking, not elsewhere classified: Secondary | ICD-10-CM

## 2021-01-18 DIAGNOSIS — R2681 Unsteadiness on feet: Secondary | ICD-10-CM

## 2021-01-18 NOTE — Therapy (Signed)
[place] MAIN [facility] SERVICES [address] [place], [place], [address] Phone: [phone]   Fax:  [phone]  Occupational Therapy Treatment  Patient Details  Name: James [name] MRN: [id number] Date of Birth: September 28, 1940 Referring Provider (OT): Joselyn Arrow   Encounter Date: 01/18/2021   OT End of Session - 01/20/21 1619     Visit Number 68    Number of Visits 48    Date for OT Re-Evaluation 03/24/21    OT Start Time 1015    OT Stop Time 1100    OT Time Calculation (min) 45 min    Activity Tolerance Patient tolerated treatment well    Behavior During Therapy [facility] for tasks assessed/performed             Past Medical History:  Diagnosis Date   Alcohol abuse, in remission    Atrial fibrillation ([place])    Bilateral renal masses    Closed right ankle fracture    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 Recurrent TBI 2009 with question of diplopia.     Past Surgical History:  Procedure Laterality Date   CRANIECTOMY FOR DEPRESSED SKULL FRACTURE  2009   with MRSA infection   CRANIOTOMY     X 2   HERNIA REPAIR     during childhood   ORIF ANKLE FRACTURE Left 2012    There were no vitals filed for this visit.   Subjective Assessment - 01/20/21 1626     Subjective  No complaints, has been working on picking up items at home, trying to use a tip to tip pinch pattern.    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  He suffered another fall in 2017.  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     Patient Stated Goals Patient indicates he wants to be stronger, use his right hand for daily tasks, be as independent as possible.            Neuromuscular Reeducation:  Pt seen  this date for manipulation of small objects with reinforcement of tip to tip pinch pattern, started with larger items, jumbo pegs to pick up from table and place into grid with cues, still tends to revert to a lateral pinch but with repetition he is able to consistently complete the task.  Progressed to smaller glass beads with round side and some flat side up, tends to be able to pick up with greater ease when the flat side is faced up.  Cues at times for prehension pattern but less cues as activity progresses.  Small pegs with moderate difficulty to pick up and manipulate from flat surface, able to complete if placed into grid and have patient remove.    Response to tx:   Patient was unable to perform 9 hole peg test last session and lost some of his tip to tip pinch when out for a couple weeks from therapy.  When he engages in repetitive trials his performance improves with larger items, still has difficulty with the small items less than 1/2 inch in size.  Responds well to graded activity and decreasing cues as session advances.  Continue to work towards goals  in plan of care to focus on prehension patterns, right hand functional use and manipulation skills to improve independence in daily tasks.                             OT Long Term Goals - 12/31/20 1754       OT LONG TERM GOAL #1   Title Patient will demonstrate use of utensil in right hand for self feeding with modified independence with little to no spillage.     Baseline unable at eval to hold utensil, 10th visit holding utensil, some feeding. 20th visit, feeding self consistently but still has some moderate spillage    Time 12    Period Weeks    Status Achieved      OT LONG TERM GOAL #2   Title Pt will increase hand strength in right by 5# to be able to cut food with modified independence.    Baseline unable to cut meat at evaluation, 10th visit starting to work on cutting meat, patient reports difficulty with  cutting meat. 12/6 still has difficulty with cutting meat unless it does not require a knife.  2/21: pt able to complete with soft meats, difficulty with steak    Time 12    Period Weeks    Status On-going    Target Date 03/24/21      OT LONG TERM GOAL #3   Title Patient will demonstrate increased awareness and strategies to attend to right side to hold items without dropping items.    Baseline difficulty with detecting items in right hand at eval, forgets items are in hand, 10th visit improved but still working on 12-30-2019: minimal dropping of items this date.  20th visit continued dropping of items occasionally but with increased awareness.  Update:  remains inconsistent but does well with repeated cues    Time 12    Period Weeks    Status On-going    Target Date 03/24/21      OT LONG TERM GOAL #4   Title Patient will demonstrate independence in home exercise program for ROM, strength and coordination of right hand.    Baseline eval-patient has not been performing exercises from last episode of care and demonstrates decline, 10th visit doing exercises at home and continue to modify program. 20th visit pt continues to engage in HEP with upgrades frequently    Time 12    Period Weeks    Status Partially Met    Target Date 03/24/21      OT LONG TERM GOAL #5   Title Pt to demonstrate improved balance with transitional movements with sit to stand from all surfaces with no loss of balance to reduce fall risk. 12/6: Pt impulsive with transitional movements and remains at risk for falls    Time 12    Period Weeks    Status On-going    Target Date 03/24/21      OT LONG TERM GOAL #6   Title Patient will demonstrate pouring a drink with the right hand with attention to right side and not knocking the cup over and spilling.      Baseline unable at eval, 10th visit starting to use right hand more and attempting to pour with right.  20th visit still has difficulty at times.  12/6: frequency is  less for this but has occasional accidents with spilling if not focused on task.    Time 12    Period  Weeks    Status On-going    Target Date 03/24/21                   Plan - 01/20/21 1619     Clinical Impression Statement Patient was unable to perform 9 hole peg test last session and lost some of his tip to tip pinch when out for a couple weeks from therapy.  When he engages in repetitive trials his performance improves with larger items, still has difficulty with the small items less than 1/2 inch in size.  Responds well to graded activity and decreasing cues as session advances.  Continue to work towards goals in plan of care to focus on prehension patterns, right hand functional use and manipulation skills to improve independence in daily tasks.    OT Occupational Profile and History Comprehensive Assessment- Review of records and extensive additional review of physical, cognitive, psychosocial history related to current functional performance    Occupational Profile and client history currently impacting functional performance repeated falls, TBI, expressive aphasia, limited motion in right hand, lack of coordination from previous CVA    Occupational performance deficits (Please refer to evaluation for details): ADL's;IADL's;Leisure    Body Structure / Function / Physical Skills ADL;Dexterity;Flexibility;ROM;Strength;Vision;Balance;Coordination;FMC;IADL;Tone;Endurance;UE functional use;Mobility    Cognitive Skills Attention;Safety Awareness;Problem Solve    Psychosocial Skills Environmental  Adaptations;Routines and Behaviors;Habits;Interpersonal Interaction    Rehab Potential Good    Clinical Decision Making Several treatment options, min-mod task modification necessary    Comorbidities Affecting Occupational Performance: May have comorbidities impacting occupational performance    Modification or Assistance to Complete Evaluation  Min-Moderate modification of tasks or assist with  assess necessary to complete eval    OT Frequency 1x / week    OT Duration 12 weeks    OT Treatment/Interventions Self-care/ADL training;Visual/perceptual remediation/compensation;DME and/or AE instruction;Patient/family education;Moist Heat;Therapeutic exercise;Manual Therapy;Therapeutic activities;Neuromuscular education    Consulted and Agree with Plan of Care Patient    Family Member Consulted wife, [name]             Patient will benefit from skilled therapeutic intervention in order to improve the following deficits and impairments:   Body Structure / Function / Physical Skills: ADL, Dexterity, Flexibility, ROM, Strength, Vision, Balance, Coordination, FMC, IADL, Tone, Endurance, UE functional use, Mobility Cognitive Skills: Attention, Safety Awareness, Problem Solve Psychosocial Skills: Environmental  Adaptations, Routines and Behaviors, Habits, Interpersonal Interaction   Visit Diagnosis: Muscle weakness (generalized)  Other lack of coordination  Neurologic neglect syndrome  Unsteadiness on feet  Difficulty in walking, not elsewhere classified    Problem List Patient Active Problem List   Diagnosis Date Noted   Sleep disturbance    PAF (paroxysmal atrial fibrillation) (HCC)    Recurrent UTI    Hypokalemia    Hyponatremia    Abnormal urine    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 [place], [place], [address] Phone: [phone]   Fax:  [phone]  Name: James [name] MRN: [id number] Date of Birth: 11-10-1940

## 2021-02-01 ENCOUNTER — Ambulatory Visit: Payer: PPO | Attending: Neurology | Admitting: Occupational Therapy

## 2021-02-01 ENCOUNTER — Other Ambulatory Visit: Payer: Self-pay

## 2021-02-01 DIAGNOSIS — R414 Neurologic neglect syndrome: Secondary | ICD-10-CM | POA: Diagnosis not present

## 2021-02-01 DIAGNOSIS — R278 Other lack of coordination: Secondary | ICD-10-CM | POA: Diagnosis not present

## 2021-02-01 DIAGNOSIS — M6281 Muscle weakness (generalized): Secondary | ICD-10-CM | POA: Diagnosis not present

## 2021-02-01 DIAGNOSIS — R2681 Unsteadiness on feet: Secondary | ICD-10-CM | POA: Diagnosis not present

## 2021-02-01 DIAGNOSIS — R262 Difficulty in walking, not elsewhere classified: Secondary | ICD-10-CM | POA: Diagnosis not present

## 2021-02-04 NOTE — Therapy (Signed)
[place] MAIN [facility] SERVICES [address] [place], [place], [address] Phone: [phone]   Fax:  [phone]  Occupational Therapy Treatment  Patient Details  Name: James [name] MRN: [id number] Date of Birth: [birth year] Referring Provider (OT): Joselyn Arrow   Encounter Date: 02/01/2021   OT End of Session - 02/04/21 1303     Visit Number 35    Number of Visits 73    Date for OT Re-Evaluation 03/24/21    OT Start Time 1110    OT Stop Time 1200    OT Time Calculation (min) 50 min    Activity Tolerance Patient tolerated treatment well    Behavior During Therapy [facility] for tasks assessed/performed             Past Medical History:  Diagnosis Date   Alcohol abuse, in remission    Atrial fibrillation ([place])    Bilateral renal masses    Closed right ankle fracture    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     Past Surgical History:  Procedure Laterality Date   CRANIECTOMY FOR DEPRESSED SKULL FRACTURE  2009   with MRSA infection   CRANIOTOMY     X 2   HERNIA REPAIR     during childhood   ORIF ANKLE FRACTURE Left 2012    There were no vitals filed for this visit.   Subjective Assessment - 02/04/21 1302     Subjective  Pt with a few bandaids on right arm, wife reports patient bumped into things with his right neglect/inattention.    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  He suffered another fall in 2017.  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     Patient Stated Goals Patient indicates he wants to be stronger, use his right hand for daily tasks, be as independent as possible.    Currently in Pain? No/denies    Multiple  Pain Sites No             Right hand in fisted position on arrival in waiting room, responded to cues for opening hand  Therapeutic Exercise: Pt seen for strengthening and ROM with use of UBE in standing with resistance of 3.3 to 3.7, for 8 min forwards, backwards alternating levels of resistance, therapist in constant attendance to ensure grip and to adjust settings, working on modulation of variable resistance with right hand and knowing how much pressure required with greater and less resistance patterns while ensuring grip.  Neuro: Pt engaging in Card shuffling modified, sorting of cards, flipping with finger and thumb combinations, cues for right hand to initiate task, use consistently during task.   Use of scrabble tile letters to copy words moderate difficulty with finding letters that appear similar, difficulty if letter is lower case and trying to scan for upper case.  When therapist is writing out the words, patient performs best if letters are in capital form as they appear on letter tiles.  Cues for scanning to the right side when seeking specific tiles. Manipulation of PVC pieces/pipe with connectors to pick up and place together to make as many connections  as possible and using right hand as a lead for the task.   Response to tx: Pt continues to demonstrate decreased attention to right side, evidenced by bumping into items at home and use of a few bandaids.  In the clinic he performs well in larger, open spaces but still needs some occasional cues to attend to right with tasks involving scanning to the right to find specific objects.  Pt required increased cues to use right hand into tasks this date.  He often initiates task with left UE but responds well to either verbal or tactile cues and once cued, he tends to increase attention for duration of that specific task unless it is a more complex task that requires problem solving.  Continue to work towards goals in plan of care to  increase independence in ADL and IADL tasks at home and in the community.                       OT Education - 02/04/21 1303     Education provided Yes    Education Details use of right hand, functional task, inattention, awareness to right side    Person(s) Educated Patient;Spouse    Methods Explanation;Demonstration;Verbal cues    Comprehension Verbalized understanding;Returned demonstration;Verbal cues required                 OT Long Term Goals - 12/31/20 1754       OT LONG TERM GOAL #1   Title Patient will demonstrate use of utensil in right hand for self feeding with modified independence with little to no spillage.     Baseline unable at eval to hold utensil, 10th visit holding utensil, some feeding. 20th visit, feeding self consistently but still has some moderate spillage    Time 12    Period Weeks    Status Achieved      OT LONG TERM GOAL #2   Title Pt will increase hand strength in right by 5# to be able to cut food with modified independence.    Baseline unable to cut meat at evaluation, 10th visit starting to work on cutting meat, patient reports difficulty with cutting meat. 12/6 still has difficulty with cutting meat unless it does not require a knife.  2/21: pt able to complete with soft meats, difficulty with steak    Time 12    Period Weeks    Status On-going    Target Date 03/24/21      OT LONG TERM GOAL #3   Title Patient will demonstrate increased awareness and strategies to attend to right side to hold items without dropping items.    Baseline difficulty with detecting items in right hand at eval, forgets items are in hand, 10th visit improved but still working on 12-30-2019: minimal dropping of items this date.  20th visit continued dropping of items occasionally but with increased awareness.  Update:  remains inconsistent but does well with repeated cues    Time 12    Period Weeks    Status On-going    Target Date 03/24/21      OT  LONG TERM GOAL #4   Title Patient will demonstrate independence in home exercise program for ROM, strength and coordination of right hand.    Baseline eval-patient has not been performing exercises from last episode of care and demonstrates decline, 10th visit doing exercises at home and continue to modify program. 20th visit pt continues to engage in HEP with upgrades  frequently    Time 12    Period Weeks    Status Partially Met    Target Date 03/24/21      OT LONG TERM GOAL #5   Title Pt to demonstrate improved balance with transitional movements with sit to stand from all surfaces with no loss of balance to reduce fall risk. 12/6: Pt impulsive with transitional movements and remains at risk for falls    Time 12    Period Weeks    Status On-going    Target Date 03/24/21      OT LONG TERM GOAL #6   Title Patient will demonstrate pouring a drink with the right hand with attention to right side and not knocking the cup over and spilling.      Baseline unable at eval, 10th visit starting to use right hand more and attempting to pour with right.  20th visit still has difficulty at times.  12/6: frequency is less for this but has occasional accidents with spilling if not focused on task.    Time 12    Period Weeks    Status On-going    Target Date 03/24/21                   Plan - 02/04/21 1304     Clinical Impression Statement Pt continues to demonstrate decreased attention to right side, evidenced by bumping into items at home and use of a few bandaids.  In the clinic he performs well in larger, open spaces but still needs some occasional cues to attend to right with tasks involving scanning to the right to find specific objects.  Pt required increased cues to use right hand into tasks this date.  He often initiates task with left UE but responds well to either verbal or tactile cues and once cued, he tends to increase attention for duration of that specific task unless it is a more  complex task that requires problem solving.  Continue to work towards goals in plan of care to increase independence in ADL and IADL tasks at home and in the community.    OT Occupational Profile and History Comprehensive Assessment- Review of records and extensive additional review of physical, cognitive, psychosocial history related to current functional performance    Occupational Profile and client history currently impacting functional performance repeated falls, TBI, expressive aphasia, limited motion in right hand, lack of coordination from previous CVA    Occupational performance deficits (Please refer to evaluation for details): ADL's;IADL's;Leisure    Body Structure / Function / Physical Skills ADL;Dexterity;Flexibility;ROM;Strength;Vision;Balance;Coordination;FMC;IADL;Tone;Endurance;UE functional use;Mobility    Cognitive Skills Attention;Safety Awareness;Problem Solve    Psychosocial Skills Environmental  Adaptations;Routines and Behaviors;Habits;Interpersonal Interaction    Rehab Potential Good    Clinical Decision Making Several treatment options, min-mod task modification necessary    Comorbidities Affecting Occupational Performance: May have comorbidities impacting occupational performance    Modification or Assistance to Complete Evaluation  Min-Moderate modification of tasks or assist with assess necessary to complete eval    OT Frequency 1x / week    OT Duration 12 weeks    OT Treatment/Interventions Self-care/ADL training;Visual/perceptual remediation/compensation;DME and/or AE instruction;Patient/family education;Moist Heat;Therapeutic exercise;Manual Therapy;Therapeutic activities;Neuromuscular education    Consulted and Agree with Plan of Care Patient    Family Member Consulted wife, [name]             Patient will benefit from skilled therapeutic intervention in order to improve the following deficits and impairments:   Body Structure /  Function / Physical Skills: ADL,  Dexterity, Flexibility, ROM, Strength, Vision, Balance, Coordination, FMC, IADL, Tone, Endurance, UE functional use, Mobility Cognitive Skills: Attention, Safety Awareness, Problem Solve Psychosocial Skills: Environmental  Adaptations, Routines and Behaviors, Habits, Interpersonal Interaction   Visit Diagnosis: Muscle weakness (generalized)  Other lack of coordination  Neurologic neglect syndrome  Unsteadiness on feet  Difficulty in walking, not elsewhere classified    Problem List Patient Active Problem List   Diagnosis Date Noted   Sleep disturbance    PAF (paroxysmal atrial fibrillation) (HCC)    Recurrent UTI    Hypokalemia    Hyponatremia    Abnormal urine    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

## 2021-02-08 ENCOUNTER — Ambulatory Visit: Payer: PPO | Admitting: Occupational Therapy

## 2021-02-15 ENCOUNTER — Other Ambulatory Visit: Payer: Self-pay

## 2021-02-15 ENCOUNTER — Ambulatory Visit: Payer: PPO | Admitting: Occupational Therapy

## 2021-02-15 DIAGNOSIS — R262 Difficulty in walking, not elsewhere classified: Secondary | ICD-10-CM

## 2021-02-15 DIAGNOSIS — M6281 Muscle weakness (generalized): Secondary | ICD-10-CM

## 2021-02-15 DIAGNOSIS — R414 Neurologic neglect syndrome: Secondary | ICD-10-CM

## 2021-02-15 DIAGNOSIS — R278 Other lack of coordination: Secondary | ICD-10-CM

## 2021-02-15 DIAGNOSIS — R2681 Unsteadiness on feet: Secondary | ICD-10-CM

## 2021-02-16 ENCOUNTER — Encounter: Payer: Self-pay | Admitting: Occupational Therapy

## 2021-02-16 NOTE — Therapy (Signed)
[place] MAIN [facility] SERVICES [address] [place], [place], [address] Phone: [phone]   Fax:  [phone]  Occupational Therapy Treatment  Patient Details  Name: James [name] MRN: [id number] Date of Birth: 09/29/1940 Referring Provider (OT): Joselyn Arrow   Encounter Date: 02/15/2021   OT End of Session - 02/16/21 0841     Visit Number 73    Number of Visits 64    Date for OT Re-Evaluation 03/24/21    OT Start Time 1100    OT Stop Time 1146    OT Time Calculation (min) 46 min    Activity Tolerance Patient tolerated treatment well    Behavior During Therapy [facility] for tasks assessed/performed             Past Medical History:  Diagnosis Date   Alcohol abuse, in remission    Atrial fibrillation ([place])    Bilateral renal masses    Closed right ankle fracture    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     Past Surgical History:  Procedure Laterality Date   CRANIECTOMY FOR DEPRESSED SKULL FRACTURE  2009   with MRSA infection   CRANIOTOMY     X 2   HERNIA REPAIR     during childhood   ORIF ANKLE FRACTURE Left 2012    There were no vitals filed for this visit.   Subjective Assessment - 02/16/21 0840     Subjective  Pt indicating he had a good weekend, still bothered by his right thumb lacking full motion.    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  He suffered another fall in 2017.  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     Patient Stated Goals Patient indicates he wants to be stronger, use his right hand for daily tasks, be as independent as possible.    Currently in Pain? No/denies    Multiple Pain Sites No             Neuromuscular reeducation:  Pt seen for manipulation of [place] discs, flipping from one side to the other with difficulty, requires cues to pick up between thumb and middle finger and use index finger in isolation to turn object.  Difficulty with managing thumb and middle finger combination as well as isolated index.  Tends to want to pick up with thumb and index but with more of a lateral pinch than with the tip of the index. Worked towards  stacking of discs up to 4 at a time and attempting to move stack with whole hand grasp of tower.    Manipulation of Coins from flat surface, difficulty picking up from tabletop and required moving to edge of the table but often dropping coins especially pennies whose color is very similar to the table cover and difficult to differentiate.  Placing coins in resistive bank in elevated position on the table.  Cues for tip to tip prehension patterns.    Response to tx: Pt indicating concern about this thumb joint which has arthritic changes, discussed issue and that the base of his thumb joint will not change but he needs to  continue to stretch and use the hand in tasks as much as possible.  Pt continues to demonstrate difficulty with manipulation skills and isolation of finger movements.  With increased attention, focus and repetition, patient tends to improve performance.  Continue to work towards goals in plan of care to increase independence in necessary daily tasks at home and in the community.                       OT Education - 02/16/21 [phone]     Education provided Yes    Education Details use of right hand, functional task, inattention, awareness to right side, thumb ROM    Person(s) Educated Patient;Spouse    Methods Explanation;Demonstration;Verbal cues    Comprehension Verbalized understanding;Returned demonstration;Verbal cues required                 OT Long Term Goals - 12/31/20 1754       OT LONG TERM GOAL  #1   Title Patient will demonstrate use of utensil in right hand for self feeding with modified independence with little to no spillage.     Baseline unable at eval to hold utensil, 10th visit holding utensil, some feeding. 20th visit, feeding self consistently but still has some moderate spillage    Time 12    Period Weeks    Status Achieved      OT LONG TERM GOAL #2   Title Pt will increase hand strength in right by 5# to be able to cut food with modified independence.    Baseline unable to cut meat at evaluation, 10th visit starting to work on cutting meat, patient reports difficulty with cutting meat. 12/6 still has difficulty with cutting meat unless it does not require a knife.  2/21: pt able to complete with soft meats, difficulty with steak    Time 12    Period Weeks    Status On-going    Target Date 03/24/21      OT LONG TERM GOAL #3   Title Patient will demonstrate increased awareness and strategies to attend to right side to hold items without dropping items.    Baseline difficulty with detecting items in right hand at eval, forgets items are in hand, 10th visit improved but still working on 12-30-2019: minimal dropping of items this date.  20th visit continued dropping of items occasionally but with increased awareness.  Update:  remains inconsistent but does well with repeated cues    Time 12    Period Weeks    Status On-going    Target Date 03/24/21      OT LONG TERM GOAL #4   Title Patient will demonstrate independence in home exercise program for ROM, strength and coordination of right hand.    Baseline eval-patient has not been performing exercises from last episode of care and demonstrates decline, 10th visit doing exercises at home and continue to modify program. 20th visit pt continues to engage in HEP with upgrades frequently    Time 12    Period Weeks    Status Partially Met    Target Date 03/24/21      OT LONG TERM GOAL #5   Title Pt to demonstrate improved  balance with transitional movements with sit to stand from all surfaces with no loss of balance to reduce fall risk. 12/6: Pt impulsive with transitional movements and remains at risk for falls    Time 12    Period Weeks    Status  On-going    Target Date 03/24/21      OT LONG TERM GOAL #6   Title Patient will demonstrate pouring a drink with the right hand with attention to right side and not knocking the cup over and spilling.      Baseline unable at eval, 10th visit starting to use right hand more and attempting to pour with right.  20th visit still has difficulty at times.  12/6: frequency is less for this but has occasional accidents with spilling if not focused on task.    Time 12    Period Weeks    Status On-going    Target Date 03/24/21                   Plan - 02/16/21 0841     Clinical Impression Statement Pt indicating concern about this thumb joint which has arthritic changes, discussed issue and that the base of his thumb joint will not change but he needs to continue to stretch and use the hand in tasks as much as possible.  Pt continues to demonstrate difficulty with manipulation skills and isolation of finger movements.  With increased attention, focus and repetition, patient tends to improve performance.  Continue to work towards goals in plan of care to increase independence in necessary daily tasks at home and in the community.    OT Occupational Profile and History Comprehensive Assessment- Review of records and extensive additional review of physical, cognitive, psychosocial history related to current functional performance    Occupational Profile and client history currently impacting functional performance repeated falls, TBI, expressive aphasia, limited motion in right hand, lack of coordination from previous CVA    Occupational performance deficits (Please refer to evaluation for details): ADL's;IADL's;Leisure    Body Structure / Function / Physical Skills  ADL;Dexterity;Flexibility;ROM;Strength;Vision;Balance;Coordination;FMC;IADL;Tone;Endurance;UE functional use;Mobility    Cognitive Skills Attention;Safety Awareness;Problem Solve    Psychosocial Skills Environmental  Adaptations;Routines and Behaviors;Habits;Interpersonal Interaction    Rehab Potential Good    Clinical Decision Making Several treatment options, min-mod task modification necessary    Comorbidities Affecting Occupational Performance: May have comorbidities impacting occupational performance    Modification or Assistance to Complete Evaluation  Min-Moderate modification of tasks or assist with assess necessary to complete eval    OT Frequency 1x / week    OT Duration 12 weeks    OT Treatment/Interventions Self-care/ADL training;Visual/perceptual remediation/compensation;DME and/or AE instruction;Patient/family education;Moist Heat;Therapeutic exercise;Manual Therapy;Therapeutic activities;Neuromuscular education    Consulted and Agree with Plan of Care Patient    Family Member Consulted wife, Gigi Gin             Patient will benefit from skilled therapeutic intervention in order to improve the following deficits and impairments:   Body Structure / Function / Physical Skills: ADL, Dexterity, Flexibility, ROM, Strength, Vision, Balance, Coordination, FMC, IADL, Tone, Endurance, UE functional use, Mobility Cognitive Skills: Attention, Safety Awareness, Problem Solve Psychosocial Skills: Environmental  Adaptations, Routines and Behaviors, Habits, Interpersonal Interaction   Visit Diagnosis: Muscle weakness (generalized)  Other lack of coordination  Neurologic neglect syndrome  Unsteadiness on feet  Difficulty in walking, not elsewhere classified    Problem List Patient Active Problem List   Diagnosis Date Noted   Sleep disturbance    PAF (paroxysmal atrial fibrillation) (HCC)    Recurrent UTI    Hypokalemia    Hyponatremia    Abnormal urine    Aphasia due to  closed TBI (traumatic brain injury)    Benign essential HTN  Urinary retention    History of gout    Global aphasia    SAH (subarachnoid hemorrhage) (HCC)    SDH (subdural hematoma) (HCC)    Lymphocytosis    Subdural hemorrhage following injury ([place]) 05/12/2016   Adden Strout T Jesslynn Kruck, OTR/L, CLT  Aashna Matson 02/16/2021, 9:50 AM  [place] MAIN [facility] SERVICES [address] [place], [place], [address] Phone: [phone]   Fax:  [phone]  Name: James [name] MRN: [id number] Date of Birth: [birth year]

## 2021-02-18 DIAGNOSIS — M1A09X0 Idiopathic chronic gout, multiple sites, without tophus (tophi): Secondary | ICD-10-CM | POA: Diagnosis not present

## 2021-02-18 DIAGNOSIS — I10 Essential (primary) hypertension: Secondary | ICD-10-CM | POA: Diagnosis not present

## 2021-02-22 ENCOUNTER — Other Ambulatory Visit: Payer: Self-pay

## 2021-02-22 ENCOUNTER — Ambulatory Visit: Payer: PPO | Attending: Neurology | Admitting: Occupational Therapy

## 2021-02-22 DIAGNOSIS — R278 Other lack of coordination: Secondary | ICD-10-CM | POA: Insufficient documentation

## 2021-02-22 DIAGNOSIS — R2681 Unsteadiness on feet: Secondary | ICD-10-CM | POA: Insufficient documentation

## 2021-02-22 DIAGNOSIS — M6281 Muscle weakness (generalized): Secondary | ICD-10-CM | POA: Diagnosis not present

## 2021-02-22 DIAGNOSIS — R262 Difficulty in walking, not elsewhere classified: Secondary | ICD-10-CM | POA: Diagnosis not present

## 2021-02-22 DIAGNOSIS — R414 Neurologic neglect syndrome: Secondary | ICD-10-CM | POA: Insufficient documentation

## 2021-02-28 NOTE — Therapy (Signed)
[place] MAIN [facility] SERVICES [address] [place], [place], [address] Phone: [phone]   Fax:  [phone]  Occupational Therapy Treatment  Patient Details  Name: James [name] MRN: [id number] Date of Birth: [birth year] Referring Provider (OT): Joselyn Arrow   Encounter Date: 02/22/2021   OT End of Session - 02/28/21 1842     Visit Number 79    Number of Visits 94    Date for OT Re-Evaluation 03/24/21    OT Start Time 1115    OT Stop Time 1201    OT Time Calculation (min) 46 min    Activity Tolerance Patient tolerated treatment well    Behavior During Therapy [facility] for tasks assessed/performed             Past Medical History:  Diagnosis Date   Alcohol abuse, in remission    Atrial fibrillation ([place])    Bilateral renal masses    Closed right ankle fracture    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     Past Surgical History:  Procedure Laterality Date   CRANIECTOMY FOR DEPRESSED SKULL FRACTURE  2009   with MRSA infection   CRANIOTOMY     X 2   HERNIA REPAIR     during childhood   ORIF ANKLE FRACTURE Left 2012    There were no vitals filed for this visit.   Subjective Assessment - 02/28/21 1841     Subjective  Pt with a cane this date and wife reports pt has been "wobbly" and needs the cane today to be more steady.    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  He suffered another fall in 2017.  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     Patient Stated Goals Patient indicates he wants to be stronger, use his right hand for daily tasks, be as independent as possible.    Currently in Pain? No/denies    Multiple Pain  Sites No            Neuro: Pt seen for focus on fine motor coordination tasks with use of right hand to pick up and manipulate 1/2 inch jumbo pegs to turn and place into [place] board to form a design copy pattern, also incorporating vision and attention to right side during task.  Pt seen for handwriting tasks for printed and scripted name. Use of regular pen so to difficulty and then use of fine tipped marker.   Response to treatment: Pt required verbal cues to attend to far right side of design copy and board for pattern this date. Difficulty with manipulation skills to turn pegs to place into grid. Handwriting continues to show decreased legibility but responds well to cues for grasping pattern and for use of gel pen or fine tipped marker. Continue towards goals to maximize safety and independence in necessary daily tasks.                       OT Education - 02/28/21 1842     Education provided Yes    Education Details use of right hand, functional task, inattention,  awareness to right side, thumb ROM    Person(s) Educated Patient;Spouse    Methods Explanation;Demonstration;Verbal cues    Comprehension Verbalized understanding;Returned demonstration;Verbal cues required                 OT Long Term Goals - 12/31/20 1754       OT LONG TERM GOAL #1   Title Patient will demonstrate use of utensil in right hand for self feeding with modified independence with little to no spillage.     Baseline unable at eval to hold utensil, 10th visit holding utensil, some feeding. 20th visit, feeding self consistently but still has some moderate spillage    Time 12    Period Weeks    Status Achieved      OT LONG TERM GOAL #2   Title Pt will increase hand strength in right by 5# to be able to cut food with modified independence.    Baseline unable to cut meat at evaluation, 10th visit starting to work on cutting meat, patient reports difficulty with cutting meat. 12/6  still has difficulty with cutting meat unless it does not require a knife.  2/21: pt able to complete with soft meats, difficulty with steak    Time 12    Period Weeks    Status On-going    Target Date 03/24/21      OT LONG TERM GOAL #3   Title Patient will demonstrate increased awareness and strategies to attend to right side to hold items without dropping items.    Baseline difficulty with detecting items in right hand at eval, forgets items are in hand, 10th visit improved but still working on 12-30-2019: minimal dropping of items this date.  20th visit continued dropping of items occasionally but with increased awareness.  Update:  remains inconsistent but does well with repeated cues    Time 12    Period Weeks    Status On-going    Target Date 03/24/21      OT LONG TERM GOAL #4   Title Patient will demonstrate independence in home exercise program for ROM, strength and coordination of right hand.    Baseline eval-patient has not been performing exercises from last episode of care and demonstrates decline, 10th visit doing exercises at home and continue to modify program. 20th visit pt continues to engage in HEP with upgrades frequently    Time 12    Period Weeks    Status Partially Met    Target Date 03/24/21      OT LONG TERM GOAL #5   Title Pt to demonstrate improved balance with transitional movements with sit to stand from all surfaces with no loss of balance to reduce fall risk. 12/6: Pt impulsive with transitional movements and remains at risk for falls    Time 12    Period Weeks    Status On-going    Target Date 03/24/21      OT LONG TERM GOAL #6   Title Patient will demonstrate pouring a drink with the right hand with attention to right side and not knocking the cup over and spilling.      Baseline unable at eval, 10th visit starting to use right hand more and attempting to pour with right.  20th visit still has difficulty at times.  12/6: frequency is less for this but has  occasional accidents with spilling if not focused on task.    Time 12    Period Weeks    Status On-going  Target Date 03/24/21                   Plan - 02/28/21 1843     Clinical Impression Statement Pt required verbal cues to attend to far right side of design copy and board for pattern this date. Difficulty with manipulation skills to turn pegs to place into grid. Handwriting continues to show decreased legibility but responds well to cues for grasping pattern and for use of gel pen or fine tipped marker. Continue towards goals to maximize safety and independence in necessary daily tasks.    OT Occupational Profile and History Comprehensive Assessment- Review of records and extensive additional review of physical, cognitive, psychosocial history related to current functional performance    Occupational Profile and client history currently impacting functional performance repeated falls, TBI, expressive aphasia, limited motion in right hand, lack of coordination from previous CVA    Occupational performance deficits (Please refer to evaluation for details): ADL's;IADL's;Leisure    Body Structure / Function / Physical Skills ADL;Dexterity;Flexibility;ROM;Strength;Vision;Balance;Coordination;FMC;IADL;Tone;Endurance;UE functional use;Mobility    Cognitive Skills Attention;Safety Awareness;Problem Solve    Psychosocial Skills Environmental  Adaptations;Routines and Behaviors;Habits;Interpersonal Interaction    Rehab Potential Good    Clinical Decision Making Several treatment options, min-mod task modification necessary    Comorbidities Affecting Occupational Performance: May have comorbidities impacting occupational performance    Modification or Assistance to Complete Evaluation  Min-Moderate modification of tasks or assist with assess necessary to complete eval    OT Frequency 1x / week    OT Duration 12 weeks    OT Treatment/Interventions Self-care/ADL training;Visual/perceptual  remediation/compensation;DME and/or AE instruction;Patient/family education;Moist Heat;Therapeutic exercise;Manual Therapy;Therapeutic activities;Neuromuscular education    Consulted and Agree with Plan of Care Patient    Family Member Consulted wife, [name]             Patient will benefit from skilled therapeutic intervention in order to improve the following deficits and impairments:   Body Structure / Function / Physical Skills: ADL, Dexterity, Flexibility, ROM, Strength, Vision, Balance, Coordination, FMC, IADL, Tone, Endurance, UE functional use, Mobility Cognitive Skills: Attention, Safety Awareness, Problem Solve Psychosocial Skills: Environmental  Adaptations, Routines and Behaviors, Habits, Interpersonal Interaction   Visit Diagnosis: Muscle weakness (generalized)  Other lack of coordination  Neurologic neglect syndrome  Unsteadiness on feet  Difficulty in walking, not elsewhere classified    Problem List Patient Active Problem List   Diagnosis Date Noted   Sleep disturbance    PAF (paroxysmal atrial fibrillation) (HCC)    Recurrent UTI    Hypokalemia    Hyponatremia    Abnormal urine    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

## 2021-03-01 ENCOUNTER — Ambulatory Visit: Payer: PPO | Admitting: Occupational Therapy

## 2021-03-01 DIAGNOSIS — L03115 Cellulitis of right lower limb: Secondary | ICD-10-CM | POA: Diagnosis not present

## 2021-03-01 DIAGNOSIS — F4489 Other dissociative and conversion disorders: Secondary | ICD-10-CM | POA: Diagnosis not present

## 2021-03-01 DIAGNOSIS — R296 Repeated falls: Secondary | ICD-10-CM | POA: Diagnosis not present

## 2021-03-03 DIAGNOSIS — I452 Bifascicular block: Secondary | ICD-10-CM | POA: Diagnosis not present

## 2021-03-03 DIAGNOSIS — I69318 Other symptoms and signs involving cognitive functions following cerebral infarction: Secondary | ICD-10-CM | POA: Diagnosis not present

## 2021-03-03 DIAGNOSIS — I69398 Other sequelae of cerebral infarction: Secondary | ICD-10-CM | POA: Diagnosis not present

## 2021-03-03 DIAGNOSIS — R55 Syncope and collapse: Secondary | ICD-10-CM | POA: Diagnosis not present

## 2021-03-03 DIAGNOSIS — D649 Anemia, unspecified: Secondary | ICD-10-CM | POA: Diagnosis not present

## 2021-03-03 DIAGNOSIS — S199XXA Unspecified injury of neck, initial encounter: Secondary | ICD-10-CM | POA: Diagnosis not present

## 2021-03-03 DIAGNOSIS — S0083XA Contusion of other part of head, initial encounter: Secondary | ICD-10-CM | POA: Diagnosis not present

## 2021-03-03 DIAGNOSIS — S299XXA Unspecified injury of thorax, initial encounter: Secondary | ICD-10-CM | POA: Diagnosis not present

## 2021-03-03 DIAGNOSIS — M7989 Other specified soft tissue disorders: Secondary | ICD-10-CM | POA: Diagnosis not present

## 2021-03-03 DIAGNOSIS — S70311A Abrasion, right thigh, initial encounter: Secondary | ICD-10-CM | POA: Diagnosis not present

## 2021-03-03 DIAGNOSIS — R2689 Other abnormalities of gait and mobility: Secondary | ICD-10-CM | POA: Diagnosis not present

## 2021-03-03 DIAGNOSIS — I119 Hypertensive heart disease without heart failure: Secondary | ICD-10-CM | POA: Diagnosis not present

## 2021-03-03 DIAGNOSIS — G9389 Other specified disorders of brain: Secondary | ICD-10-CM | POA: Diagnosis not present

## 2021-03-03 DIAGNOSIS — W010XXA Fall on same level from slipping, tripping and stumbling without subsequent striking against object, initial encounter: Secondary | ICD-10-CM | POA: Diagnosis not present

## 2021-03-03 DIAGNOSIS — S80212A Abrasion, left knee, initial encounter: Secondary | ICD-10-CM | POA: Diagnosis not present

## 2021-03-03 DIAGNOSIS — W19XXXA Unspecified fall, initial encounter: Secondary | ICD-10-CM | POA: Diagnosis not present

## 2021-03-03 DIAGNOSIS — I444 Left anterior fascicular block: Secondary | ICD-10-CM | POA: Diagnosis not present

## 2021-03-03 DIAGNOSIS — I517 Cardiomegaly: Secondary | ICD-10-CM | POA: Diagnosis not present

## 2021-03-03 DIAGNOSIS — S0990XA Unspecified injury of head, initial encounter: Secondary | ICD-10-CM | POA: Diagnosis not present

## 2021-03-03 DIAGNOSIS — M47816 Spondylosis without myelopathy or radiculopathy, lumbar region: Secondary | ICD-10-CM | POA: Diagnosis not present

## 2021-03-03 DIAGNOSIS — S3993XA Unspecified injury of pelvis, initial encounter: Secondary | ICD-10-CM | POA: Diagnosis not present

## 2021-03-03 DIAGNOSIS — S80211A Abrasion, right knee, initial encounter: Secondary | ICD-10-CM | POA: Diagnosis not present

## 2021-03-03 DIAGNOSIS — I4510 Unspecified right bundle-branch block: Secondary | ICD-10-CM | POA: Diagnosis not present

## 2021-03-03 DIAGNOSIS — S0993XA Unspecified injury of face, initial encounter: Secondary | ICD-10-CM | POA: Diagnosis not present

## 2021-03-03 DIAGNOSIS — R402411 Glasgow coma scale score 13-15, in the field [EMT or ambulance]: Secondary | ICD-10-CM | POA: Diagnosis not present

## 2021-03-03 DIAGNOSIS — R918 Other nonspecific abnormal finding of lung field: Secondary | ICD-10-CM | POA: Diagnosis not present

## 2021-03-03 DIAGNOSIS — I4891 Unspecified atrial fibrillation: Secondary | ICD-10-CM | POA: Diagnosis not present

## 2021-03-03 DIAGNOSIS — M25561 Pain in right knee: Secondary | ICD-10-CM | POA: Diagnosis not present

## 2021-03-03 DIAGNOSIS — R404 Transient alteration of awareness: Secondary | ICD-10-CM | POA: Diagnosis not present

## 2021-03-04 DIAGNOSIS — M7989 Other specified soft tissue disorders: Secondary | ICD-10-CM | POA: Diagnosis not present

## 2021-03-04 DIAGNOSIS — W19XXXA Unspecified fall, initial encounter: Secondary | ICD-10-CM | POA: Diagnosis not present

## 2021-03-08 ENCOUNTER — Ambulatory Visit: Payer: PPO | Admitting: Occupational Therapy

## 2021-03-09 DIAGNOSIS — D649 Anemia, unspecified: Secondary | ICD-10-CM | POA: Diagnosis not present

## 2021-03-15 ENCOUNTER — Ambulatory Visit: Payer: PPO | Admitting: Occupational Therapy

## 2021-03-19 DIAGNOSIS — F3342 Major depressive disorder, recurrent, in full remission: Secondary | ICD-10-CM | POA: Diagnosis not present

## 2021-03-19 DIAGNOSIS — D649 Anemia, unspecified: Secondary | ICD-10-CM | POA: Diagnosis not present

## 2021-03-19 DIAGNOSIS — M47817 Spondylosis without myelopathy or radiculopathy, lumbosacral region: Secondary | ICD-10-CM | POA: Diagnosis not present

## 2021-03-19 DIAGNOSIS — M47816 Spondylosis without myelopathy or radiculopathy, lumbar region: Secondary | ICD-10-CM | POA: Diagnosis not present

## 2021-03-19 DIAGNOSIS — I2510 Atherosclerotic heart disease of native coronary artery without angina pectoris: Secondary | ICD-10-CM | POA: Diagnosis not present

## 2021-03-19 DIAGNOSIS — E538 Deficiency of other specified B group vitamins: Secondary | ICD-10-CM | POA: Diagnosis not present

## 2021-03-19 DIAGNOSIS — M1A09X0 Idiopathic chronic gout, multiple sites, without tophus (tophi): Secondary | ICD-10-CM | POA: Diagnosis not present

## 2021-03-19 DIAGNOSIS — I69320 Aphasia following cerebral infarction: Secondary | ICD-10-CM | POA: Diagnosis not present

## 2021-03-19 DIAGNOSIS — G40209 Localization-related (focal) (partial) symptomatic epilepsy and epileptic syndromes with complex partial seizures, not intractable, without status epilepticus: Secondary | ICD-10-CM | POA: Diagnosis not present

## 2021-03-19 DIAGNOSIS — I700 Atherosclerosis of aorta: Secondary | ICD-10-CM | POA: Diagnosis not present

## 2021-03-19 DIAGNOSIS — N2889 Other specified disorders of kidney and ureter: Secondary | ICD-10-CM | POA: Diagnosis not present

## 2021-03-19 DIAGNOSIS — D696 Thrombocytopenia, unspecified: Secondary | ICD-10-CM | POA: Diagnosis not present

## 2021-03-19 DIAGNOSIS — R1084 Generalized abdominal pain: Secondary | ICD-10-CM | POA: Diagnosis not present

## 2021-03-19 DIAGNOSIS — I4891 Unspecified atrial fibrillation: Secondary | ICD-10-CM | POA: Diagnosis not present

## 2021-03-19 DIAGNOSIS — R29898 Other symptoms and signs involving the musculoskeletal system: Secondary | ICD-10-CM | POA: Diagnosis not present

## 2021-03-19 DIAGNOSIS — I10 Essential (primary) hypertension: Secondary | ICD-10-CM | POA: Diagnosis not present

## 2021-03-19 DIAGNOSIS — E7849 Other hyperlipidemia: Secondary | ICD-10-CM | POA: Diagnosis not present

## 2021-03-19 DIAGNOSIS — R531 Weakness: Secondary | ICD-10-CM | POA: Diagnosis not present

## 2021-03-22 ENCOUNTER — Ambulatory Visit: Payer: PPO | Admitting: Occupational Therapy

## 2021-03-22 DIAGNOSIS — D649 Anemia, unspecified: Secondary | ICD-10-CM | POA: Diagnosis not present

## 2021-03-22 DIAGNOSIS — R29898 Other symptoms and signs involving the musculoskeletal system: Secondary | ICD-10-CM | POA: Diagnosis not present

## 2021-03-23 DIAGNOSIS — R278 Other lack of coordination: Secondary | ICD-10-CM | POA: Diagnosis not present

## 2021-03-23 DIAGNOSIS — F419 Anxiety disorder, unspecified: Secondary | ICD-10-CM | POA: Diagnosis not present

## 2021-03-23 DIAGNOSIS — E7849 Other hyperlipidemia: Secondary | ICD-10-CM | POA: Diagnosis not present

## 2021-03-23 DIAGNOSIS — E538 Deficiency of other specified B group vitamins: Secondary | ICD-10-CM | POA: Diagnosis not present

## 2021-03-23 DIAGNOSIS — Z0000 Encounter for general adult medical examination without abnormal findings: Secondary | ICD-10-CM | POA: Diagnosis not present

## 2021-03-23 DIAGNOSIS — K219 Gastro-esophageal reflux disease without esophagitis: Secondary | ICD-10-CM | POA: Diagnosis not present

## 2021-03-23 DIAGNOSIS — Z741 Need for assistance with personal care: Secondary | ICD-10-CM | POA: Diagnosis not present

## 2021-03-23 DIAGNOSIS — R600 Localized edema: Secondary | ICD-10-CM | POA: Diagnosis not present

## 2021-03-23 DIAGNOSIS — G9389 Other specified disorders of brain: Secondary | ICD-10-CM | POA: Diagnosis not present

## 2021-03-23 DIAGNOSIS — M1A09X0 Idiopathic chronic gout, multiple sites, without tophus (tophi): Secondary | ICD-10-CM | POA: Diagnosis not present

## 2021-03-23 DIAGNOSIS — R4701 Aphasia: Secondary | ICD-10-CM | POA: Diagnosis not present

## 2021-03-23 DIAGNOSIS — R531 Weakness: Secondary | ICD-10-CM | POA: Diagnosis not present

## 2021-03-23 DIAGNOSIS — G319 Degenerative disease of nervous system, unspecified: Secondary | ICD-10-CM | POA: Diagnosis not present

## 2021-03-23 DIAGNOSIS — R2681 Unsteadiness on feet: Secondary | ICD-10-CM | POA: Diagnosis not present

## 2021-03-23 DIAGNOSIS — I2510 Atherosclerotic heart disease of native coronary artery without angina pectoris: Secondary | ICD-10-CM | POA: Diagnosis not present

## 2021-03-23 DIAGNOSIS — I4891 Unspecified atrial fibrillation: Secondary | ICD-10-CM | POA: Diagnosis not present

## 2021-03-23 DIAGNOSIS — D519 Vitamin B12 deficiency anemia, unspecified: Secondary | ICD-10-CM | POA: Diagnosis not present

## 2021-03-23 DIAGNOSIS — F3342 Major depressive disorder, recurrent, in full remission: Secondary | ICD-10-CM | POA: Diagnosis not present

## 2021-03-23 DIAGNOSIS — D696 Thrombocytopenia, unspecified: Secondary | ICD-10-CM | POA: Diagnosis not present

## 2021-03-23 DIAGNOSIS — G40209 Localization-related (focal) (partial) symptomatic epilepsy and epileptic syndromes with complex partial seizures, not intractable, without status epilepticus: Secondary | ICD-10-CM | POA: Diagnosis not present

## 2021-03-23 DIAGNOSIS — E785 Hyperlipidemia, unspecified: Secondary | ICD-10-CM | POA: Diagnosis not present

## 2021-03-23 DIAGNOSIS — F339 Major depressive disorder, recurrent, unspecified: Secondary | ICD-10-CM | POA: Diagnosis not present

## 2021-03-23 DIAGNOSIS — M6281 Muscle weakness (generalized): Secondary | ICD-10-CM | POA: Diagnosis not present

## 2021-03-23 DIAGNOSIS — I10 Essential (primary) hypertension: Secondary | ICD-10-CM | POA: Diagnosis not present

## 2021-03-23 DIAGNOSIS — Z9889 Other specified postprocedural states: Secondary | ICD-10-CM | POA: Diagnosis not present

## 2021-03-23 DIAGNOSIS — R4189 Other symptoms and signs involving cognitive functions and awareness: Secondary | ICD-10-CM | POA: Diagnosis not present

## 2021-03-23 DIAGNOSIS — G4089 Other seizures: Secondary | ICD-10-CM | POA: Diagnosis not present

## 2021-03-23 DIAGNOSIS — R2689 Other abnormalities of gait and mobility: Secondary | ICD-10-CM | POA: Diagnosis not present

## 2021-03-24 ENCOUNTER — Other Ambulatory Visit: Payer: Self-pay | Admitting: Internal Medicine

## 2021-03-24 DIAGNOSIS — R634 Abnormal weight loss: Secondary | ICD-10-CM

## 2021-03-24 DIAGNOSIS — R1084 Generalized abdominal pain: Secondary | ICD-10-CM

## 2021-04-05 ENCOUNTER — Ambulatory Visit
Admission: RE | Admit: 2021-04-05 | Discharge: 2021-04-05 | Disposition: A | Discharge status: Home / Self Care | Payer: PPO | Source: Ambulatory Visit | Attending: Internal Medicine | Admitting: Internal Medicine

## 2021-04-05 ENCOUNTER — Other Ambulatory Visit: Payer: Self-pay

## 2021-04-05 ENCOUNTER — Other Ambulatory Visit: Payer: Self-pay | Admitting: Internal Medicine

## 2021-04-05 ENCOUNTER — Encounter: Payer: PPO | Admitting: Occupational Therapy

## 2021-04-05 DIAGNOSIS — R600 Localized edema: Secondary | ICD-10-CM | POA: Insufficient documentation

## 2021-04-05 DIAGNOSIS — G40209 Localization-related (focal) (partial) symptomatic epilepsy and epileptic syndromes with complex partial seizures, not intractable, without status epilepticus: Secondary | ICD-10-CM | POA: Diagnosis not present

## 2021-04-05 DIAGNOSIS — F3342 Major depressive disorder, recurrent, in full remission: Secondary | ICD-10-CM | POA: Diagnosis not present

## 2021-04-05 DIAGNOSIS — R531 Weakness: Secondary | ICD-10-CM

## 2021-04-05 DIAGNOSIS — E7849 Other hyperlipidemia: Secondary | ICD-10-CM | POA: Diagnosis not present

## 2021-04-05 DIAGNOSIS — G319 Degenerative disease of nervous system, unspecified: Secondary | ICD-10-CM | POA: Diagnosis not present

## 2021-04-05 DIAGNOSIS — E538 Deficiency of other specified B group vitamins: Secondary | ICD-10-CM | POA: Diagnosis not present

## 2021-04-05 DIAGNOSIS — G9389 Other specified disorders of brain: Secondary | ICD-10-CM | POA: Diagnosis not present

## 2021-04-05 DIAGNOSIS — I4891 Unspecified atrial fibrillation: Secondary | ICD-10-CM | POA: Diagnosis not present

## 2021-04-05 DIAGNOSIS — M1A09X0 Idiopathic chronic gout, multiple sites, without tophus (tophi): Secondary | ICD-10-CM | POA: Diagnosis not present

## 2021-04-05 DIAGNOSIS — I10 Essential (primary) hypertension: Secondary | ICD-10-CM | POA: Diagnosis not present

## 2021-04-05 DIAGNOSIS — I2510 Atherosclerotic heart disease of native coronary artery without angina pectoris: Secondary | ICD-10-CM | POA: Diagnosis not present

## 2021-04-05 DIAGNOSIS — D696 Thrombocytopenia, unspecified: Secondary | ICD-10-CM | POA: Diagnosis not present

## 2021-04-05 DIAGNOSIS — K219 Gastro-esophageal reflux disease without esophagitis: Secondary | ICD-10-CM | POA: Diagnosis not present

## 2021-04-05 DIAGNOSIS — Z9889 Other specified postprocedural states: Secondary | ICD-10-CM | POA: Diagnosis not present

## 2021-04-05 DIAGNOSIS — Z0000 Encounter for general adult medical examination without abnormal findings: Secondary | ICD-10-CM | POA: Diagnosis not present

## 2021-04-12 ENCOUNTER — Encounter: Payer: PPO | Admitting: Occupational Therapy

## 2021-04-12 DIAGNOSIS — Z8673 Personal history of transient ischemic attack (TIA), and cerebral infarction without residual deficits: Secondary | ICD-10-CM | POA: Diagnosis not present

## 2021-04-12 DIAGNOSIS — G40209 Localization-related (focal) (partial) symptomatic epilepsy and epileptic syndromes with complex partial seizures, not intractable, without status epilepticus: Secondary | ICD-10-CM | POA: Diagnosis not present

## 2021-04-12 DIAGNOSIS — M1A09X0 Idiopathic chronic gout, multiple sites, without tophus (tophi): Secondary | ICD-10-CM | POA: Diagnosis not present

## 2021-04-12 DIAGNOSIS — E538 Deficiency of other specified B group vitamins: Secondary | ICD-10-CM | POA: Diagnosis not present

## 2021-04-12 DIAGNOSIS — R2689 Other abnormalities of gait and mobility: Secondary | ICD-10-CM | POA: Diagnosis not present

## 2021-04-12 DIAGNOSIS — M6281 Muscle weakness (generalized): Secondary | ICD-10-CM | POA: Diagnosis not present

## 2021-04-12 DIAGNOSIS — F339 Major depressive disorder, recurrent, unspecified: Secondary | ICD-10-CM | POA: Diagnosis not present

## 2021-04-12 DIAGNOSIS — E7849 Other hyperlipidemia: Secondary | ICD-10-CM | POA: Diagnosis not present

## 2021-04-12 DIAGNOSIS — R600 Localized edema: Secondary | ICD-10-CM | POA: Diagnosis not present

## 2021-04-12 DIAGNOSIS — R2681 Unsteadiness on feet: Secondary | ICD-10-CM | POA: Diagnosis not present

## 2021-04-12 DIAGNOSIS — I2510 Atherosclerotic heart disease of native coronary artery without angina pectoris: Secondary | ICD-10-CM | POA: Diagnosis not present

## 2021-04-12 DIAGNOSIS — G4089 Other seizures: Secondary | ICD-10-CM | POA: Diagnosis not present

## 2021-04-12 DIAGNOSIS — I4891 Unspecified atrial fibrillation: Secondary | ICD-10-CM | POA: Diagnosis not present

## 2021-04-12 DIAGNOSIS — D696 Thrombocytopenia, unspecified: Secondary | ICD-10-CM | POA: Diagnosis not present

## 2021-04-12 DIAGNOSIS — I10 Essential (primary) hypertension: Secondary | ICD-10-CM | POA: Diagnosis not present

## 2021-04-12 DIAGNOSIS — F3342 Major depressive disorder, recurrent, in full remission: Secondary | ICD-10-CM | POA: Diagnosis not present

## 2021-04-12 DIAGNOSIS — R4701 Aphasia: Secondary | ICD-10-CM | POA: Diagnosis not present

## 2021-04-15 ENCOUNTER — Other Ambulatory Visit: Payer: Self-pay

## 2021-04-15 ENCOUNTER — Ambulatory Visit
Admission: RE | Admit: 2021-04-15 | Discharge: 2021-04-15 | Disposition: A | Discharge status: Home / Self Care | Payer: PPO | Source: Ambulatory Visit | Attending: Internal Medicine | Admitting: Internal Medicine

## 2021-04-15 DIAGNOSIS — R1084 Generalized abdominal pain: Secondary | ICD-10-CM | POA: Diagnosis not present

## 2021-04-15 DIAGNOSIS — R634 Abnormal weight loss: Secondary | ICD-10-CM | POA: Insufficient documentation

## 2021-04-15 DIAGNOSIS — R109 Unspecified abdominal pain: Secondary | ICD-10-CM | POA: Diagnosis not present

## 2021-04-15 LAB — POCT I-STAT CREATININE: Creatinine, Ser: 0.7 mg/dL (ref 0.61–1.24)

## 2021-04-15 MED ORDER — IOHEXOL 350 MG/ML SOLN
80.0000 mL | Freq: Once | INTRAVENOUS | Status: AC | PRN
  Administered 2021-04-15: 80 mL via INTRAVENOUS

## 2021-04-19 ENCOUNTER — Encounter: Payer: PPO | Admitting: Occupational Therapy

## 2021-04-22 ENCOUNTER — Inpatient Hospital Stay: Payer: PPO

## 2021-04-22 ENCOUNTER — Telehealth: Payer: Self-pay

## 2021-04-22 ENCOUNTER — Inpatient Hospital Stay: Payer: PPO | Attending: Oncology | Admitting: Oncology

## 2021-04-22 ENCOUNTER — Encounter: Payer: Self-pay | Admitting: Oncology

## 2021-04-22 VITALS — BP 98/64 | HR 102 | Temp 96.6°F | Resp 16 | Ht 70.0 in | Wt 177.0 lb

## 2021-04-22 DIAGNOSIS — I119 Hypertensive heart disease without heart failure: Secondary | ICD-10-CM | POA: Diagnosis not present

## 2021-04-22 DIAGNOSIS — M6289 Other specified disorders of muscle: Secondary | ICD-10-CM

## 2021-04-22 DIAGNOSIS — R1900 Intra-abdominal and pelvic swelling, mass and lump, unspecified site: Secondary | ICD-10-CM

## 2021-04-22 NOTE — Telephone Encounter (Signed)
Request for bx of Psoas mass seen on CT faxed to specialty scheduling.  Will f/u 7-10 days after bx.

## 2021-04-22 NOTE — Progress Notes (Signed)
New patient evaluation.   

## 2021-04-23 NOTE — Progress Notes (Signed)
[place]  [phone] [phone]  ID: James [name] OB: 10-May-1941  MR#: [id number]  YWV#:[id number]  Patient Care Team: Adin Hector, MD as PCP - General (Internal Medicine)  CHIEF COMPLAINT: Large abdominal mass.  INTERVAL HISTORY: Patient is an 80 year old male with history of traumatic brain injury who was recently noted to have difficulty walking by his family.  Subsequent work-up included CT scan of the abdomen which revealed a large 15.4 cm mass which appears to arise from the right iliopsoas muscle.  Much of the history is given by his wife.  He has no new neurologic complaints.  He denies any recent fevers or illnesses.  He has a good appetite, but has had recent unintentional weight loss.  He has no chest pain, shortness of breath, cough, or hemoptysis.  He denies any nausea, vomiting, constipation, or diarrhea.  He has no urinary complaints.  Patient offers no further specific complaints today  REVIEW OF SYSTEMS:   Review of Systems  Constitutional:  Positive for weight loss. Negative for fever and malaise/fatigue.  Respiratory: Negative.  Negative for cough, hemoptysis and shortness of breath.   Cardiovascular: Negative.  Negative for chest pain and leg swelling.  Gastrointestinal: Negative.  Negative for abdominal pain, blood in stool and melena.  Genitourinary: Negative.  Negative for dysuria.  Musculoskeletal: Negative.  Negative for back pain.  Skin: Negative.  Negative for rash.  Neurological:  Positive for focal weakness. Negative for dizziness, weakness and headaches.  Psychiatric/Behavioral: Negative.  The patient is not nervous/anxious.    As per HPI. Otherwise, a complete review of systems is negative.  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 Recurrent TBI 2009 with question of diplopia.     PAST SURGICAL HISTORY: Past Surgical History:  Procedure Laterality Date   CRANIECTOMY FOR DEPRESSED SKULL FRACTURE  2009   with MRSA infection   CRANIOTOMY     X 2   HERNIA REPAIR     during childhood   ORIF ANKLE FRACTURE Left 2012    FAMILY HISTORY: Family History  Problem Relation Age of Onset   Cancer Neg Hx     ADVANCED DIRECTIVES (Y/N):  N  HEALTH MAINTENANCE: Social History   Tobacco Use   Smoking status: Never   Smokeless tobacco: Never  Substance Use Topics   Alcohol use: No   Drug use: No     Colonoscopy:  PAP:  Bone density:  Lipid panel:  Allergies  Allergen Reactions   Sulfamethoxazole-Trimethoprim Anaphylaxis    Other reaction(s): Unknown   Bactrim Other (See Comments)    Unknown   Codeine Other (See Comments)    Unknown   Tetanus Toxoids Other (See Comments)    Unknown    Current Outpatient Medications  Medication Sig Dispense Refill   acetaminophen (TYLENOL) 325 MG tablet Take 1-2 tablets (325-650 mg total) by mouth every 4 (four) hours as needed for mild pain.     ALPRAZolam (XANAX) 0.25 MG tablet Take by mouth.     cyanocobalamin 1000 MCG  tablet Take by mouth.     famotidine (PEPCID) 20 MG tablet Take 1 tablet (20 mg total) by mouth 2 (two) times daily.     furosemide (LASIX) 20 MG tablet Take 20 mg by mouth daily.     levETIRAcetam (KEPPRA) 500 MG tablet Take by mouth.     metoprolol (LOPRESSOR) 50 MG tablet Take 1 tablet (50 mg total) by mouth 2 (two) times daily.     metoprolol succinate (TOPROL-XL) 25 MG 24 hr tablet Take 25 mg by mouth daily. 1/2 tab qd     potassium chloride (KLOR-CON) 10 MEQ tablet Take by mouth.     sertraline (ZOLOFT) 100 MG tablet Take 100 mg by mouth daily.     simvastatin  (ZOCOR) 40 MG tablet Take 40 mg by mouth at bedtime.     bethanechol (URECHOLINE) 25 MG tablet Take 1 tablet (25 mg total) by mouth 4 (four) times daily. (Patient not taking: No sig reported)     digoxin (LANOXIN) 0.125 MG tablet Take 125 mcg by mouth daily. (Patient not taking: Reported on 04/22/2021)     lidocaine (XYLOCAINE) 2 % jelly Apply topically as needed (Use with in and out catheter). (Patient not taking: No sig reported) 30 mL 0   No current facility-administered medications for this visit.    OBJECTIVE: Vitals:   04/22/21 1341  BP: 98/64  Pulse: (!) 102  Resp: 16  Temp: (!) 96.6 F (35.9 C)     Body mass index is 25.4 kg/m.    ECOG FS:1 - Symptomatic but completely ambulatory  General: Thin, no acute distress.  Sitting in a wheelchair. Eyes: Pink conjunctiva, anicteric sclera. HEENT: Normocephalic, moist mucous membranes. Lungs: No audible wheezing or coughing. Heart: Regular rate and rhythm. Abdomen: Soft, nontender, no obvious distention. Musculoskeletal: No edema, cyanosis, or clubbing. Neuro: Alert, answering all questions appropriately. Cranial nerves grossly intact. Skin: No rashes or petechiae noted. Psych: Normal affect. Lymphatics: No cervical, calvicular, axillary or inguinal LAD.   LAB RESULTS:  Lab Results  Component Value Date   NA 132 (L) 05/26/2016   K 4.1 05/26/2016   CL 93 (L) 05/26/2016   CO2 31 05/26/2016   GLUCOSE 109 (H) 05/26/2016   BUN 14 05/26/2016   CREATININE 0.70 04/15/2021   CALCIUM 9.1 05/26/2016   PROT 7.8 05/13/2016   ALBUMIN 2.9 (L) 05/13/2016   AST 113 (H) 05/13/2016   ALT 147 (H) 05/13/2016   ALKPHOS 232 (H) 05/13/2016   BILITOT 1.1 05/13/2016   GFRNONAA >60 05/26/2016   GFRAA >60 05/26/2016    Lab Results  Component Value Date   WBC 10.3 05/23/2016   NEUTROABS 7.2 05/23/2016   HGB 14.9 05/23/2016   HCT 42.8 05/23/2016   MCV 82.1 05/23/2016   PLT 276 05/23/2016     STUDIES: CT HEAD WO CONTRAST  (5MM)  Result Date: 04/05/2021 CLINICAL DATA:  Right-sided weakness. EXAM: CT HEAD WITHOUT CONTRAST TECHNIQUE: Contiguous axial images were obtained from the base of the skull through the vertex without intravenous contrast. COMPARISON:  CT head 07/04/2016. FINDINGS: Brain: There is no evidence of acute intracranial hemorrhage, mass lesion, brain edema or new extra-axial fluid collection. There is chronic atrophy with diffuse prominence of the ventricles and subarachnoid spaces. There is asymmetric encephalomalacia within the left frontal, temporal and parietal lobes associated with ipsilateral dilatation of the left lateral ventricle. There is no CT evidence of acute cortical infarction. Vascular: Intracranial vascular calcifications. No hyperdense vessel identified. Skull: Chronic postsurgical changes  from previous left frontal parietal craniotomy and reconstruction. There are several right-sided chronic burr holes. No evidence of acute calvarial fracture. Sinuses/Orbits: Chronic small effusion of the left mastoid air cells inferiorly. The right mastoid air cells and visualized portions of the paranasal sinuses are clear. No orbital abnormality. Other: None. IMPRESSION: 1. No evidence of acute intracranial process. 2. Stable chronic sequela of previous left craniotomy with extensive encephalomalacia and ipsilateral ventricular dilatation. Electronically Signed   By: Richardean Sale M.D.   On: 04/05/2021 18:13   CT ABDOMEN PELVIS W CONTRAST  Result Date: 04/16/2021 CLINICAL DATA:  Generalized abdominal pain, weight loss EXAM: CT ABDOMEN AND PELVIS WITH CONTRAST TECHNIQUE: Multidetector CT imaging of the abdomen and pelvis was performed using the standard protocol following bolus administration of intravenous contrast. CONTRAST:  42mL OMNIPAQUE IOHEXOL 350 MG/ML SOLN, additional oral enteric contrast COMPARISON:  None. FINDINGS: Lower chest: No acute abnormality. Cardiomegaly. Small hiatal hernia.  Hepatobiliary: No solid liver abnormality is seen. No gallstones, gallbladder wall thickening, or biliary dilatation. Pancreas: Unremarkable. No pancreatic ductal dilatation or surrounding inflammatory changes. Spleen: Normal in size without significant abnormality. Adrenals/Urinary Tract: Adrenal glands are unremarkable. Kidneys are normal, without renal calculi, solid lesion, or hydronephrosis. Thickening of the urinary bladder wall, likely due to chronic outlet obstruction. Stomach/Bowel: Stomach is within normal limits. Appendix appears normal. No evidence of bowel wall thickening, distention, or inflammatory changes. Vascular/Lymphatic: Aortic atherosclerosis. No enlarged abdominal or pelvic lymph nodes. Reproductive: No mass or other significant abnormality. Other: No abdominal wall hernia or abnormality. No abdominopelvic ascites. There is a large mass centered in the right iliopsoas musculature, extending to involve the right aspect of the L4 and L5 vertebral bodies with frank bony destruction, measuring at least 15.4 x 13.8 x 14.0 cm (series 2, image 45, series 5, image 57). Musculoskeletal: No acute or significant osseous findings. IMPRESSION: 1. There is a large mass centered in the right iliopsoas musculature, extending to involve the right aspect of the L4 and L5 vertebral bodies with frank bony destruction, measuring at least 15.4 x 13.8 x 14.0 cm. 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 2. No lymphadenopathy or solid organ metastatic disease in the abdomen or pelvis. These results will be called to the ordering clinician or representative by the Radiologist Assistant, and communication documented in the PACS or [employer]. Aortic Atherosclerosis (ICD10-I70.0). Electronically Signed   By: Eddie Candle M.D.   On: 04/16/2021 08:32   US Venous Img Lower Unilateral Right (DVT)  Result Date: 04/05/2021 CLINICAL DATA:  Right lower extremity edema EXAM: Right LOWER EXTREMITY  VENOUS DOPPLER ULTRASOUND TECHNIQUE: Gray-scale sonography with compression, as well as color and duplex ultrasound, were performed to evaluate the deep venous system(s) from the level of the common femoral vein through the popliteal and proximal calf veins. COMPARISON:  None. FINDINGS: VENOUS Normal compressibility of the common femoral, superficial femoral, and popliteal veins, as well as the visualized calf veins. Visualized portions of profunda femoral vein and great saphenous vein unremarkable. No filling defects to suggest DVT on grayscale or color Doppler imaging. Doppler waveforms show normal direction of venous flow, normal respiratory plasticity and response to augmentation. Limited views of the contralateral common femoral vein are unremarkable. OTHER None. Limitations: none IMPRESSION: Negative. Electronically Signed   By: Merilyn Baba M.D.   On: 04/05/2021 17:44    ASSESSMENT: Large abdominal mass.  PLAN:    1.  Large abdominal mass: CT scan results from April 16, 2021  reviewed independently and reported as above with large 15.4 cm abdominal mass appearing to arise from the right iliopsoas muscle involving the right aspect of the L4 and L5 vertebral bodies with frank bony destruction.  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  Will get a PET scan and CT-guided biopsy for additional staging and confirmation of diagnosis.  Patient's wife has already indicated that they may decline treatment, but do wish to find out this diagnosis.  Return to clinic in 2 to 3 weeks after PET scan and biopsy are completed.  I spent a total of 60 minutes reviewing chart data, face-to-face evaluation with the patient, counseling and coordination of care as detailed above.   Patient expressed understanding and was in agreement with this plan. He also understands that He can call clinic at any time with any questions, concerns, or complaints.   Cancer Staging No matching staging information  was found for the patient.  Lloyd Huger, MD   04/23/2021 7:56 PM

## 2021-04-24 DIAGNOSIS — I4891 Unspecified atrial fibrillation: Secondary | ICD-10-CM | POA: Diagnosis not present

## 2021-04-24 DIAGNOSIS — F339 Major depressive disorder, recurrent, unspecified: Secondary | ICD-10-CM | POA: Diagnosis not present

## 2021-04-24 DIAGNOSIS — G4089 Other seizures: Secondary | ICD-10-CM | POA: Diagnosis not present

## 2021-04-24 DIAGNOSIS — R4701 Aphasia: Secondary | ICD-10-CM | POA: Diagnosis not present

## 2021-04-24 DIAGNOSIS — I2510 Atherosclerotic heart disease of native coronary artery without angina pectoris: Secondary | ICD-10-CM | POA: Diagnosis not present

## 2021-04-24 DIAGNOSIS — R2681 Unsteadiness on feet: Secondary | ICD-10-CM | POA: Diagnosis not present

## 2021-04-24 DIAGNOSIS — M1A09X0 Idiopathic chronic gout, multiple sites, without tophus (tophi): Secondary | ICD-10-CM | POA: Diagnosis not present

## 2021-04-24 DIAGNOSIS — R2689 Other abnormalities of gait and mobility: Secondary | ICD-10-CM | POA: Diagnosis not present

## 2021-04-24 DIAGNOSIS — I10 Essential (primary) hypertension: Secondary | ICD-10-CM | POA: Diagnosis not present

## 2021-04-24 DIAGNOSIS — M6281 Muscle weakness (generalized): Secondary | ICD-10-CM | POA: Diagnosis not present

## 2021-04-26 ENCOUNTER — Encounter: Payer: PPO | Admitting: Occupational Therapy

## 2021-04-26 DIAGNOSIS — D696 Thrombocytopenia, unspecified: Secondary | ICD-10-CM | POA: Diagnosis not present

## 2021-04-26 DIAGNOSIS — M1A09X0 Idiopathic chronic gout, multiple sites, without tophus (tophi): Secondary | ICD-10-CM | POA: Diagnosis not present

## 2021-04-26 DIAGNOSIS — G40209 Localization-related (focal) (partial) symptomatic epilepsy and epileptic syndromes with complex partial seizures, not intractable, without status epilepticus: Secondary | ICD-10-CM | POA: Diagnosis not present

## 2021-04-26 DIAGNOSIS — I4891 Unspecified atrial fibrillation: Secondary | ICD-10-CM | POA: Diagnosis not present

## 2021-04-26 DIAGNOSIS — I2510 Atherosclerotic heart disease of native coronary artery without angina pectoris: Secondary | ICD-10-CM | POA: Diagnosis not present

## 2021-04-26 DIAGNOSIS — R1903 Right lower quadrant abdominal swelling, mass and lump: Secondary | ICD-10-CM | POA: Diagnosis not present

## 2021-04-26 DIAGNOSIS — F3342 Major depressive disorder, recurrent, in full remission: Secondary | ICD-10-CM | POA: Diagnosis not present

## 2021-04-26 DIAGNOSIS — E7849 Other hyperlipidemia: Secondary | ICD-10-CM | POA: Diagnosis not present

## 2021-04-26 DIAGNOSIS — I10 Essential (primary) hypertension: Secondary | ICD-10-CM | POA: Diagnosis not present

## 2021-04-26 NOTE — Telephone Encounter (Signed)
Patient is scheudled for PET on 04/27/2021.  Will schedule biopsy once PET results are available.

## 2021-04-27 ENCOUNTER — Encounter
Admission: RE | Admit: 2021-04-27 | Discharge: 2021-04-27 | Disposition: A | Discharge status: Home / Self Care | Payer: PPO | Source: Ambulatory Visit | Attending: Oncology | Admitting: Oncology

## 2021-04-27 ENCOUNTER — Other Ambulatory Visit: Payer: Self-pay

## 2021-04-27 DIAGNOSIS — I2510 Atherosclerotic heart disease of native coronary artery without angina pectoris: Secondary | ICD-10-CM | POA: Diagnosis not present

## 2021-04-27 DIAGNOSIS — R1909 Other intra-abdominal and pelvic swelling, mass and lump: Secondary | ICD-10-CM | POA: Insufficient documentation

## 2021-04-27 DIAGNOSIS — M6289 Other specified disorders of muscle: Secondary | ICD-10-CM | POA: Diagnosis not present

## 2021-04-27 LAB — GLUCOSE, CAPILLARY: Glucose-Capillary: 79 mg/dL (ref 70–99)

## 2021-04-27 MED ORDER — FLUDEOXYGLUCOSE F - 18 (FDG) INJECTION
9.2000 | Freq: Once | INTRAVENOUS | Status: AC | PRN
  Administered 2021-04-27: 9.7 via INTRAVENOUS

## 2021-04-29 NOTE — Telephone Encounter (Addendum)
Scheduled for biopsy on 05/05/2021 to arrive at 10:00 for 11:00 appointment.  The IR nurse will call him prior to the procedure to go over the instructions.    Please schedule him for MD follow up 7-10 days after biopsy and inform patient's wife of appointment details.

## 2021-05-03 ENCOUNTER — Encounter: Payer: PPO | Admitting: Occupational Therapy

## 2021-05-03 ENCOUNTER — Telehealth: Payer: Self-pay | Admitting: Oncology

## 2021-05-03 ENCOUNTER — Ambulatory Visit: Payer: PPO

## 2021-05-03 NOTE — Telephone Encounter (Signed)
Returned call to patient's spouse.  They do not wish to pursue further work up or treatment.  She reports that he has been declining quickly in the past few days which includes no use of his right leg and not able to get out of bed.  Referral to [facility] has been initiated by PCP Dr. Caryl Comes.   The appointment for biopsy has been cancelled.  Please cancel f/u at [place].

## 2021-05-03 NOTE — Telephone Encounter (Signed)
Pt wife called and would like to have the patient Biopsy canceled. They have decided not to have it completed. She is requesting a call from [facility] to discuss. She will be available this afternoon for a phone call.

## 2021-05-05 ENCOUNTER — Ambulatory Visit: Admission: RE | Admit: 2021-05-05 | Payer: PPO | Source: Ambulatory Visit

## 2021-05-10 ENCOUNTER — Encounter: Payer: PPO | Admitting: Occupational Therapy

## 2021-05-12 DIAGNOSIS — R1310 Dysphagia, unspecified: Secondary | ICD-10-CM | POA: Diagnosis not present

## 2021-05-12 DIAGNOSIS — R4701 Aphasia: Secondary | ICD-10-CM | POA: Diagnosis not present

## 2021-05-12 DIAGNOSIS — E785 Hyperlipidemia, unspecified: Secondary | ICD-10-CM | POA: Diagnosis not present

## 2021-05-12 DIAGNOSIS — R569 Unspecified convulsions: Secondary | ICD-10-CM | POA: Diagnosis not present

## 2021-05-12 DIAGNOSIS — I4891 Unspecified atrial fibrillation: Secondary | ICD-10-CM | POA: Diagnosis not present

## 2021-05-12 DIAGNOSIS — I10 Essential (primary) hypertension: Secondary | ICD-10-CM | POA: Diagnosis not present

## 2021-05-12 DIAGNOSIS — K689 Other disorders of retroperitoneum: Secondary | ICD-10-CM | POA: Diagnosis not present

## 2021-05-13 ENCOUNTER — Ambulatory Visit: Payer: PPO | Admitting: Oncology

## 2021-05-13 DIAGNOSIS — F0150 Vascular dementia without behavioral disturbance: Secondary | ICD-10-CM

## 2021-05-13 DIAGNOSIS — K219 Gastro-esophageal reflux disease without esophagitis: Secondary | ICD-10-CM | POA: Diagnosis not present

## 2021-05-13 DIAGNOSIS — G4089 Other seizures: Secondary | ICD-10-CM

## 2021-05-13 DIAGNOSIS — I4811 Longstanding persistent atrial fibrillation: Secondary | ICD-10-CM | POA: Diagnosis not present

## 2021-05-13 DIAGNOSIS — C480 Malignant neoplasm of retroperitoneum: Secondary | ICD-10-CM | POA: Diagnosis not present

## 2021-05-13 DIAGNOSIS — G8191 Hemiplegia, unspecified affecting right dominant side: Secondary | ICD-10-CM | POA: Diagnosis not present

## 2021-05-17 ENCOUNTER — Encounter: Payer: PPO | Admitting: Occupational Therapy

## 2021-05-24 ENCOUNTER — Encounter: Payer: PPO | Admitting: Occupational Therapy

## 2021-05-25 DIAGNOSIS — C469 Kaposi's sarcoma, unspecified: Secondary | ICD-10-CM

## 2021-05-25 DIAGNOSIS — G4089 Other seizures: Secondary | ICD-10-CM | POA: Diagnosis not present

## 2021-05-25 DIAGNOSIS — F0150 Vascular dementia without behavioral disturbance: Secondary | ICD-10-CM | POA: Diagnosis not present

## 2021-05-25 DIAGNOSIS — G8191 Hemiplegia, unspecified affecting right dominant side: Secondary | ICD-10-CM | POA: Diagnosis not present

## 2021-05-25 DIAGNOSIS — I4819 Other persistent atrial fibrillation: Secondary | ICD-10-CM | POA: Diagnosis not present

## 2021-05-28 DIAGNOSIS — R4182 Altered mental status, unspecified: Secondary | ICD-10-CM | POA: Diagnosis not present

## 2021-05-28 DIAGNOSIS — C480 Malignant neoplasm of retroperitoneum: Secondary | ICD-10-CM | POA: Diagnosis not present

## 2021-05-31 ENCOUNTER — Encounter: Payer: PPO | Admitting: Occupational Therapy

## 2021-06-07 ENCOUNTER — Encounter: Payer: PPO | Admitting: Occupational Therapy

## 2021-06-14 ENCOUNTER — Encounter: Payer: PPO | Admitting: Occupational Therapy

## 2021-06-21 ENCOUNTER — Encounter: Payer: PPO | Admitting: Occupational Therapy

## 2021-06-24 DEATH — deceased

## 2021-06-28 ENCOUNTER — Encounter: Payer: PPO | Admitting: Occupational Therapy

## 2021-07-05 ENCOUNTER — Encounter: Payer: PPO | Admitting: Occupational Therapy

## 2021-07-12 ENCOUNTER — Encounter: Payer: PPO | Admitting: Occupational Therapy

## 2021-08-02 ENCOUNTER — Encounter: Payer: PPO | Admitting: Occupational Therapy

## 2021-08-09 ENCOUNTER — Encounter: Payer: PPO | Admitting: Occupational Therapy

## 2021-08-16 ENCOUNTER — Encounter: Payer: PPO | Admitting: Occupational Therapy

## 2021-08-23 ENCOUNTER — Encounter: Payer: PPO | Admitting: Occupational Therapy

## 2021-08-30 ENCOUNTER — Encounter: Payer: PPO | Admitting: Occupational Therapy
